# Patient Record
Sex: Male | Born: 1950 | Race: Black or African American | Hispanic: No | Marital: Married | Smoking: Former smoker
Health system: Southern US, Community
[De-identification: ages and names within clinical notes are randomized; demographics above are authoritative.]

## PROBLEM LIST (undated history)

## (undated) DIAGNOSIS — M1990 Unspecified osteoarthritis, unspecified site: Secondary | ICD-10-CM

## (undated) DIAGNOSIS — C20 Malignant neoplasm of rectum: Secondary | ICD-10-CM

## (undated) DIAGNOSIS — T7840XA Allergy, unspecified, initial encounter: Secondary | ICD-10-CM

## (undated) HISTORY — PX: WISDOM TOOTH EXTRACTION: SHX21

## (undated) HISTORY — DX: Allergy, unspecified, initial encounter: T78.40XA

## (undated) HISTORY — DX: Unspecified osteoarthritis, unspecified site: M19.90

---

## 2015-08-27 ENCOUNTER — Ambulatory Visit (INDEPENDENT_AMBULATORY_CARE_PROVIDER_SITE_OTHER): Payer: Managed Care, Other (non HMO) | Admitting: Family

## 2015-08-27 ENCOUNTER — Encounter: Payer: Self-pay | Admitting: Family

## 2015-08-27 ENCOUNTER — Other Ambulatory Visit (INDEPENDENT_AMBULATORY_CARE_PROVIDER_SITE_OTHER): Payer: Managed Care, Other (non HMO)

## 2015-08-27 ENCOUNTER — Telehealth: Payer: Self-pay | Admitting: Family

## 2015-08-27 VITALS — BP 142/86 | HR 78 | Temp 97.8°F | Resp 18 | Ht 71.0 in | Wt 222.8 lb

## 2015-08-27 DIAGNOSIS — Z23 Encounter for immunization: Secondary | ICD-10-CM | POA: Diagnosis not present

## 2015-08-27 DIAGNOSIS — R202 Paresthesia of skin: Secondary | ICD-10-CM

## 2015-08-27 DIAGNOSIS — R200 Anesthesia of skin: Secondary | ICD-10-CM | POA: Insufficient documentation

## 2015-08-27 LAB — B12 AND FOLATE PANEL
Folate: 6.3 ng/mL (ref >5.9)
Vitamin B-12: 332 pg/mL (ref 211–911)

## 2015-08-27 LAB — TSH: TSH: 2.21 u[IU]/mL (ref 0.35–4.50)

## 2015-08-27 LAB — CBC
HEMATOCRIT: 44.1 % (ref 39.0–52.0)
HEMOGLOBIN: 14.7 g/dL (ref 13.0–17.0)
MCHC: 33.4 g/dL (ref 30.0–36.0)
MCV: 90.8 fl (ref 78.0–100.0)
Platelets: 279 10*3/uL (ref 150.0–400.0)
RBC: 4.86 Mil/uL (ref 4.22–5.81)
RDW: 12 % (ref 11.5–15.5)
WBC: 4.9 10*3/uL (ref 4.0–10.5)

## 2015-08-27 LAB — HEMOGLOBIN A1C: HEMOGLOBIN A1C: 4.4 % — AB (ref 4.6–6.5)

## 2015-08-27 NOTE — Patient Instructions (Signed)
Thank you for choosing [facility].  Summary/Instructions:  Please schedule a time for your physical at your convenience.  Please stop by the lab on the basement level of the building for your blood work. Your results will be released to [place] (or called to you) after review, usually within 72 hours after test completion. If any changes need to be made, you will be notified at that same time.  If your symptoms worsen or fail to improve, please contact our office for further instruction, or in case of emergency go directly to the emergency room at the closest medical facility.

## 2015-08-27 NOTE — Telephone Encounter (Signed)
Please inform patient that his blood work was all within the normal ranges therefore his symptoms are most likely musculoskeletal related. Please continue with the current plan of splinting as needed and follow up if there is no improvement.

## 2015-08-27 NOTE — Assessment & Plan Note (Signed)
Transient numbness and tingling along radial nerve distribution with no obvious cause. Obtain TSH, hemoglobin A1c, CBC, and B12/folate to rule out metabolic causes. She conservatively with splint at night. Follow-up pending lab work or worsening of symptoms.

## 2015-08-27 NOTE — Addendum Note (Signed)
Addended by: Delice Bison E on: 08/27/2015 09:22 AM   Modules accepted: Orders

## 2015-08-27 NOTE — Progress Notes (Signed)
Subjective:    [name], male    DOB: [birth year], 64 y.o.   MRN: [id number]  Chief Complaint  Patient presents with  . Establish Care    numbness in fingers on his right hand on and off, x3 years     HPI:  Shane [name] is a 64 y.o. male who  has a past medical history of Arthritis and Allergy. and presents today for an office visit to establish care.  1.) Numbness in hand - Associated symptom of numbness and tingling located in his right hand has been going on for about 4 years and described as he feels like he was laying on his hand. Timing of the symptoms are worse in the morning and improves throughout the day to near complete resolution. Numbness and tingling is primarily in his thumb and second finger, but it is also in all of his fingers. He is right hand dominant. Denies trauma to the area or sounds/sensations heard or felt. Denies neck pain, but notes occasional arm soreness. Time is the only modifying factor and denies other treatments attempted.   No Known Allergies   No outpatient prescriptions prior to visit.   No facility-administered medications prior to visit.     Past Medical History  Diagnosis Date  . Arthritis   . Allergy      History reviewed. No pertinent past surgical history.   Family History  Problem Relation Age of Onset  . Healthy Mother   . Hypertension Father   . Stroke Father      Social History   Social History  . Marital Status: Married    Spouse Name: N/A  . Number of Children: 2  . Years of Education: 14   Occupational History  . Service Technician    Social History Main Topics  . Smoking status: Former Smoker -- 1.00 packs/day for 10 years    Types: Cigarettes    Quit date: 08/09/1978  . Smokeless tobacco: Never Used  . Alcohol Use: No  . Drug Use: No  . Sexual Activity: Not on file   Other Topics Concern  . 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  Endocrine: Negative for polydipsia, polyphagia and polyuria.  Musculoskeletal: Negative for neck pain and neck stiffness.  Neurological: Positive for numbness. Negative for weakness.      Objective:    BP 142/86 mmHg  Pulse 78  Temp(Src) 97.8 F (36.6 C) (Oral)  Resp 18  Ht 5\' 11"  (1.803 m)  Wt 222 lb 12.8 oz (101.061 kg)  BMI 31.09 kg/m2  SpO2 97% Nursing note and vital signs reviewed.  Physical Exam  Constitutional: He is oriented to person, place, and time. He appears well-developed and well-nourished. No distress.  Cardiovascular: Normal rate, regular rhythm, normal heart sounds and intact distal pulses.   Pulmonary/Chest: Effort normal and breath sounds normal.  Musculoskeletal:  Right hand - no obvious deformity, discoloration, or edema noted. No tenderness able to be elicited. Range of motion of fingers and wrist are normal both actively and passively. Tinel sign is negative; Phalen sign is negative. Pulses and capillary refill are intact and appropriate.  Neurological: He is alert and oriented to person, place, and time.  Skin: Skin is warm and dry.  Psychiatric: He has a normal mood and affect. His behavior is  normal. Judgment and thought content normal.       Assessment & Plan:   Problem List Items Addressed This Visit      Other   Numbness and tingling in hands - Primary    Transient numbness and tingling along radial nerve distribution with no obvious cause. Obtain TSH, hemoglobin A1c, CBC, and B12/folate to rule out metabolic causes. She conservatively with splint at night. Follow-up pending lab work or worsening of symptoms.      Relevant Orders   Hemoglobin A1c   CBC   B12 and Folate Panel   TSH

## 2015-08-29 NOTE — Telephone Encounter (Signed)
LVM for pt to call back. Phone was cut off

## 2015-09-02 ENCOUNTER — Encounter: Payer: Self-pay | Admitting: Family

## 2015-09-02 ENCOUNTER — Ambulatory Visit (INDEPENDENT_AMBULATORY_CARE_PROVIDER_SITE_OTHER): Payer: Managed Care, Other (non HMO) | Admitting: Family

## 2015-09-02 VITALS — BP 138/80 | HR 69 | Temp 98.2°F | Resp 18 | Ht 71.0 in | Wt 222.1 lb

## 2015-09-02 DIAGNOSIS — Z23 Encounter for immunization: Secondary | ICD-10-CM

## 2015-09-02 DIAGNOSIS — Z0000 Encounter for general adult medical examination without abnormal findings: Secondary | ICD-10-CM | POA: Insufficient documentation

## 2015-09-02 NOTE — Patient Instructions (Addendum)
Thank you for choosing [facility].  Summary/Instructions:  Please stop by the lab on the basement level of the building for your blood work. Your results will be released to [place] (or called to you) after review, usually within 72 hours after test completion. If any changes need to be made, you will be notified at that same time.  If your symptoms worsen or fail to improve, please contact our office for further instruction, or in case of emergency go directly to the emergency room at the closest medical facility.    Health Maintenance, Male A healthy lifestyle and preventative care can promote health and wellness.  Maintain regular health, dental, and eye exams.  Eat a healthy diet. Foods like vegetables, fruits, whole grains, low-fat dairy products, and lean protein foods contain the nutrients you need and are low in calories. Decrease your intake of foods high in solid fats, added sugars, and salt. Get information about a proper diet from your health care provider, if necessary.  Regular physical exercise is one of the most important things you can do for your health. Most adults should get at least 150 minutes of moderate-intensity exercise (any activity that increases your heart rate and causes you to sweat) each week. In addition, most adults need muscle-strengthening exercises on 2 or more days a week.   Maintain a healthy weight. The body mass index (BMI) is a screening tool to identify possible weight problems. It provides an estimate of body fat based on height and weight. Your health care provider can find your BMI and can help you achieve or maintain a healthy weight. For males 20 years and older:  A BMI below 18.5 is considered underweight.  A BMI of 18.5 to 24.9 is normal.  A BMI of 25 to 29.9 is considered overweight.  A BMI of 30 and above is considered obese.  Maintain normal blood lipids and cholesterol by exercising and minimizing your intake of saturated fat.  Eat a balanced diet with plenty of fruits and vegetables. Blood tests for lipids and cholesterol should begin at age 66 and be repeated every 5 years. If your lipid or cholesterol levels are high, you are over age 49, or you are at high risk for heart disease, you may need your cholesterol levels checked more frequently.Ongoing high lipid and cholesterol levels should be treated with medicines if diet and exercise are not working.  If you smoke, find out from your health care provider how to quit. If you do not use tobacco, do not start.  Lung cancer screening is recommended for adults aged 80-80 years who are at high risk for developing lung cancer because of a history of smoking. A yearly low-dose CT scan of the lungs is recommended for people who have at least a 30-pack-year history of smoking and are current smokers or have quit within the past 15 years. A pack year of smoking is smoking an average of 1 pack of cigarettes a day for 1 year (for example, a 30-pack-year history of smoking could mean smoking 1 pack a day for 30 years or 2 packs a day for 15 years). Yearly screening should continue until the smoker has stopped smoking for at least 15 years. Yearly screening should be stopped for people who develop a health problem that would prevent them from having lung cancer treatment.  If you choose to drink alcohol, do not have more than 2 drinks per day. One drink is considered to be 12 oz (360 mL)  of beer, 5 oz (150 mL) of wine, or 1.5 oz (45 mL) of liquor.  Avoid the use of street drugs. Do not share needles with anyone. Ask for help if you need support or instructions about stopping the use of drugs.  High blood pressure causes heart disease and increases the risk of stroke. High blood pressure is more likely to develop in:  People who have blood pressure in the end of the normal range (100-139/85-89 mm Hg).  People who are overweight or obese.  People who are African American.  If you are  57-1 years of age, have your blood pressure checked every 3-5 years. If you are 4 years of age or older, have your blood pressure checked every year. You should have your blood pressure measured twice--once when you are at a hospital or clinic, and once when you are not at a hospital or clinic. Record the average of the two measurements. To check your blood pressure when you are not at a hospital or clinic, you can use:  An automated blood pressure machine at a pharmacy.  A home blood pressure monitor.  If you are 33-25 years old, ask your health care provider if you should take aspirin to prevent heart disease.  Diabetes screening involves taking a blood sample to check your fasting blood sugar level. This should be done once every 3 years after age 32 if you are at a normal weight and without risk factors for diabetes. Testing should be considered at a younger age or be carried out more frequently if you are overweight and have at least 1 risk factor for diabetes.  Colorectal cancer can be detected and often prevented. Most routine colorectal cancer screening begins at the age of 51 and continues through age 31. However, your health care provider may recommend screening at an earlier age if you have risk factors for colon cancer. On a yearly basis, your health care provider may provide home test kits to check for hidden blood in the stool. A small camera at the end of a tube may be used to directly examine the colon (sigmoidoscopy or colonoscopy) to detect the earliest forms of colorectal cancer. Talk to your health care provider about this at age 63 when routine screening begins. A direct exam of the colon should be repeated every 5-10 years through age 33, unless early forms of precancerous polyps or small growths are found.  People who are at an increased risk for hepatitis B should be screened for this virus. You are considered at high risk for hepatitis B if:  You were born in a country where  hepatitis B occurs often. Talk with your health care provider about which countries are considered high risk.  Your parents were born in a high-risk country and you have not received a shot to protect against hepatitis B (hepatitis B vaccine).  You have HIV or AIDS.  You use needles to inject street drugs.  You live with, or have sex with, someone who has hepatitis B.  You are a man who has sex with other men (MSM).  You get hemodialysis treatment.  You take certain medicines for conditions like cancer, organ transplantation, and autoimmune conditions.  Hepatitis C blood testing is recommended for all people born from [birth year] through 1967 and any individual with known risk factors for hepatitis C.  Healthy men should no longer receive prostate-specific antigen (PSA) blood tests as part of routine cancer screening. Talk to your health care provider about  prostate cancer screening.  Testicular cancer screening is not recommended for adolescents or adult males who have no symptoms. Screening includes self-exam, a health care provider exam, and other screening tests. Consult with your health care provider about any symptoms you have or any concerns you have about testicular cancer.  Practice safe sex. Use condoms and avoid high-risk sexual practices to reduce the spread of sexually transmitted infections (STIs).  You should be screened for STIs, including gonorrhea and chlamydia if:  You are sexually active and are younger than 24 years.  You are older than 24 years, and your health care provider tells you that you are at risk for this type of infection.  Your sexual activity has changed since you were last screened, and you are at an increased risk for chlamydia or gonorrhea. Ask your health care provider if you are at risk.  If you are at risk of being infected with HIV, it is recommended that you take a prescription medicine daily to prevent HIV infection. This is called pre-exposure  prophylaxis (PrEP). You are considered at risk if:  You are a man who has sex with other men (MSM).  You are a heterosexual man who is sexually active with multiple partners.  You take drugs by injection.  You are sexually active with a partner who has HIV.  Talk with your health care provider about whether you are at high risk of being infected with HIV. If you choose to begin PrEP, you should first be tested for HIV. You should then be tested every 3 months for as long as you are taking PrEP.  Use sunscreen. Apply sunscreen liberally and repeatedly throughout the day. You should seek shade when your shadow is shorter than you. Protect yourself by wearing long sleeves, pants, a wide-brimmed hat, and sunglasses year round whenever you are outdoors.  Tell your health care provider of new moles or changes in moles, especially if there is a change in shape or color. Also, tell your health care provider if a mole is larger than the size of a pencil eraser.  A one-time screening for abdominal aortic aneurysm (AAA) and surgical repair of large AAAs by ultrasound is recommended for men aged 80-75 years who are current or former smokers.  Stay current with your vaccines (immunizations).   This information is not intended to replace advice given to you by your health care provider. Make sure you discuss any questions you have with your health care provider.   Document Released: 04/23/2008 Document Revised: 11/16/2014 Document Reviewed: 03/23/2011 Elsevier Interactive Patient Education 2016 [place] Directive Advance directives are the legal documents that allow you to make choices about your health care and medical treatment if you cannot speak for yourself. Advance directives are a way for you to communicate your wishes to family, friends, and health care providers. The specified people can then convey your decisions about end-of-life care to avoid confusion if you should become  unable to communicate. Ideally, the process of discussing and writing advance directives should happen over time rather than making decisions all at once. Advance directives can be modified as your situation changes, and you can change your mind at any time, even after you have signed the advance directives. Each state has its own laws regarding advance directives. You may want to check with your health care provider, attorney, or state representative about the law in your state. Below are some examples of advance directives. LIVING WILL A living will is  a set of instructions documenting your wishes about medical care when you cannot care for yourself. It is used if you become:  Terminally ill.  Incapacitated.  Unable to communicate.  Unable to make decisions. Items to consider in your living will include:  The use or non-use of life-sustaining equipment, such as dialysis machines and breathing machines (ventilators).  A do not resuscitate (DNR) order, which is the instruction not to use cardiopulmonary resuscitation (CPR) if breathing or heartbeat stops.  Tube feeding.  Withholding of food and fluids.  Comfort (palliative) care when the goal becomes comfort rather than a cure.  Organ and tissue donation. A living will does not give instructions about distribution of your money and property if you should pass away. It is advisable to seek the expert advice of a lawyer in drawing up a will regarding your possessions. Decisions about taxes, beneficiaries, and asset distribution will be legally binding. This process can relieve your family and friends of any burdens surrounding disputes or questions that may come up about the allocation of your assets. DO NOT RESUSCITATE (DNR) A do not resuscitate (DNR) order is a request to not have CPR in the event that your heart stops beating or you stop breathing. Unless given other instructions, a health care provider will try to help any patient whose  heart has stopped or who has stopped breathing.  HEALTH CARE PROXY AND DURABLE POWER OF ATTORNEY FOR HEALTH CARE A health care proxy is a person (agent) appointed to make medical decisions for you if you cannot. Generally, people choose someone they know well and trust to represent their preferences when they can no longer do so. You should be sure to ask this person for agreement to act as your agent. An agent may have to exercise judgment in the event of a medical decision for which your wishes are not known. The durable power of attorney for health care is the legal document that names your health care proxy. Once written, it should be:  Signed.  Notarized.  Dated.  Copied.  Witnessed.  Incorporated into your medical record. You may also want to appoint someone to manage your financial affairs if you cannot. This is called a durable power of attorney for finances. It is a separate legal document from the durable power of attorney for health care. You may choose the same person or someone different from your health care proxy to act as your agent in financial matters.   This information is not intended to replace advice given to you by your health care provider. Make sure you discuss any questions you have with your health care provider.   Document Released: 02/02/2008 Document Revised: 10/31/2013 Document Reviewed: 03/15/2013 Elsevier Interactive Patient Education [employer].

## 2015-09-02 NOTE — Telephone Encounter (Signed)
Pt aware of results 

## 2015-09-02 NOTE — Assessment & Plan Note (Signed)
1) Anticipatory Guidance: Discussed importance of wearing a seatbelt while driving and not texting while driving; changing batteries in smoke detector at least once annually; wearing suntan lotion when outside; eating a balanced and moderate diet; getting physical activity at least 30 minutes per day.  2) Immunizations / Screenings / Labs:  Prevnar updated. Zostavax declined following discussion. All other immunizations are up-to-date per recommendations. Due for a dental screening which will be scheduled independently. Due for colonoscopy which referral has been placed. Due for prostate check. PSA ordered. All other screenings are up-to-date per recommendations.Obtain CBC, CMET, Lipid profile and TSH when fasting.   Overall well exam. Patient has risk factors for cardiovascular disease including obesity, sedentary lifestyle, and inadequate nutrition. Counseled on increasing nutrient density of foods and decreasing saturated fats through increased fruits and vegetables. Increase physical activity to 30 minutes most days of the week with recommended steps of approximately 10,000. Weight loss goal of 5-10% of current weight to improve health. Continue other healthy lifestyle behaviors. Follow-up prevention exam in one year. Follow-up office visit pending blood work.

## 2015-09-02 NOTE — Progress Notes (Signed)
Pre visit review using our clinic review tool, if applicable. No additional management support is needed unless otherwise documented below in the visit note. 

## 2015-09-02 NOTE — Progress Notes (Signed)
Subjective:    [name], male    DOB: [birth year], 64 y.o.   MRN: [id number]  Chief Complaint  Patient presents with  . CPE    Not fasting    HPI:  Shane [name] is a 64 y.o. male who presents today for an annual wellness visit.   1) Health Maintenance -   Diet - Averages about 1-2 meals per day with snacks consisting of chicken, beef, occasional fish, some vegetables, fruit and dairy; Caffeine intake of about 2-3 cups daily.  Exercise - No structured exercise.    2) Preventative Exams / Immunizations:  Dental -- Due for exam  Vision -- Up to date.    Health Maintenance  Topic Date Due  . Hepatitis C Screening  1951-03-23  . HIV Screening  10/22/1964  . COLONOSCOPY  10/23/1999  . ZOSTAVAX  10/22/2009  . PNA vac Low Risk Adult (1 of 2 - PCV13) 10/22/2014  . INFLUENZA VACCINE  06/09/2016  . TETANUS/TDAP  08/26/2025  Prevnar  Immunization History  Administered Date(s) Administered  . Influenza,inj,Quad PF,36+ Mos 08/27/2015  . Pneumococcal Conjugate-13 09/02/2015  . Tdap 08/27/2015    No Known Allergies   Outpatient Prescriptions Prior to Visit  Medication Sig Dispense Refill  . diclofenac (VOLTAREN) 75 MG EC tablet Take 75 mg by mouth 2 (two) times daily.     No facility-administered medications prior to visit.     Past Medical History  Diagnosis Date  . Arthritis   . Allergy      No past surgical history on file.   Family History  Problem Relation Age of Onset  . Healthy Mother   . Hypertension Father   . Stroke Father      Social History   Social History  . Marital Status: Married    Spouse Name: N/A  . Number of Children: 2  . Years of Education: 14   Occupational History  . Service Technician    Social History Main Topics  . Smoking status: Former Smoker -- 1.00 packs/day for 10 years    Types: Cigarettes    Quit date: 08/09/1978  . Smokeless tobacco: Never Used  . Alcohol Use: No  . Drug Use: No  .  Sexual Activity: Not on file   Other Topics Concern  . 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 HENT: Head: Denies headache or neck pain Ears: Denies changes in hearing, ringing in ears, earache, drainage Nose: Denies discharge, stuffiness, itching, nosebleed, sinus pain Throat: Denies sore throat, hoarseness, dry mouth, sores, thrush Eyes: Denies loss/changes in vision, pain, redness, blurry/double vision, flashing lights Cardiovascular: Denies chest pain/discomfort, tightness, palpitations, shortness of breath with activity, difficulty lying down, swelling, sudden awakening with shortness of breath Respiratory: Denies shortness of breath, cough, sputum production, wheezing Gastrointestinal: Denies dysphasia, heartburn, change in appetite, nausea, change in bowel habits, rectal bleeding, constipation, diarrhea, yellow skin or eyes Genitourinary: Denies frequency, urgency, burning/pain, blood in urine, incontinence, change in urinary strength. Musculoskeletal: Denies muscle/joint pain, stiffness, back pain, redness or swelling of joints, trauma Skin: Denies rashes, lumps, itching, dryness, color changes, or hair/nail changes Neurological: Denies dizziness, fainting, seizures, weakness, numbness, tingling, tremor Psychiatric - Denies nervousness, stress, depression or memory loss Endocrine: Denies heat or cold intolerance, sweating, frequent urination, excessive thirst, changes in appetite Hematologic: Denies  ease of bruising or bleeding     Objective:    BP 138/80 mmHg  Pulse 69  Temp(Src) 98.2 F (36.8 C) (Oral)  Resp 18  Ht 5\' 11"  (1.803 m)  Wt 222 lb 1.9 oz (100.753 kg)  BMI 30.99 kg/m2  SpO2 97% Nursing note and vital signs reviewed.  Physical Exam  Constitutional: He is oriented to person, place, and time. He  appears well-developed and well-nourished.  HENT:  Head: Normocephalic.  Right Ear: Hearing, tympanic membrane, external ear and ear canal normal.  Left Ear: Hearing, tympanic membrane, external ear and ear canal normal.  Nose: Nose normal.  Mouth/Throat: Uvula is midline, oropharynx is clear and moist and mucous membranes are normal.  Eyes: Conjunctivae and EOM are normal. Pupils are equal, round, and reactive to light.  Neck: Neck supple. No JVD present. No tracheal deviation present. No thyromegaly present.  Cardiovascular: Normal rate, regular rhythm, normal heart sounds and intact distal pulses.   Pulmonary/Chest: Effort normal and breath sounds normal.  Abdominal: Soft. Bowel sounds are normal. He exhibits no distension and no mass. There is no tenderness. There is no rebound and no guarding.  Musculoskeletal: Normal range of motion. He exhibits no edema or tenderness.  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   Lymphadenopathy:    He has no cervical adenopathy.  Neurological: He is alert and oriented to person, place, and time. He has normal reflexes. No cranial nerve deficit. He exhibits normal muscle tone. Coordination normal.  Skin: Skin is warm and dry.  Psychiatric: He has a normal mood and affect. His behavior is normal. Judgment and thought content normal.       Assessment & Plan:   Problem List Items Addressed This Visit      Other   Routine general medical examination at a health care facility - Primary    1) Anticipatory Guidance: Discussed importance of wearing a seatbelt while driving and not texting while driving; changing batteries in smoke detector at least once annually; wearing suntan lotion when outside; eating a balanced and moderate diet; getting physical activity at least 30 minutes per day.  2) Immunizations / Screenings / Labs:  Prevnar updated. Zostavax declined following discussion. All other immunizations are up-to-date per  recommendations. Due for a dental screening which will be scheduled independently. Due for colonoscopy which referral has been placed. Due for prostate check. PSA ordered. All other screenings are up-to-date per recommendations.Obtain CBC, CMET, Lipid profile and TSH when fasting.   Overall well exam. Patient has risk factors for cardiovascular disease including obesity, sedentary lifestyle, and inadequate nutrition. Counseled on increasing nutrient density of foods and decreasing saturated fats through increased fruits and vegetables. Increase physical activity to 30 minutes most days of the week with recommended steps of approximately 10,000. Weight loss goal of 5-10% of current weight to improve health. Continue other healthy lifestyle behaviors. Follow-up prevention exam in one year. Follow-up office visit pending blood work.       Relevant Orders   Ambulatory referral to Gastroenterology   CBC   Comprehensive metabolic panel   TSH   PSA   Lipid panel    Other Visit Diagnoses    Need for vaccination with 13-polyvalent pneumococcal conjugate vaccine        Relevant Orders    Pneumococcal conjugate vaccine 13-valent IM (Completed)

## 2015-09-03 ENCOUNTER — Other Ambulatory Visit (INDEPENDENT_AMBULATORY_CARE_PROVIDER_SITE_OTHER): Payer: Managed Care, Other (non HMO)

## 2015-09-03 ENCOUNTER — Telehealth: Payer: Self-pay | Admitting: Family

## 2015-09-03 ENCOUNTER — Encounter: Payer: Self-pay | Admitting: Family

## 2015-09-03 DIAGNOSIS — Z0000 Encounter for general adult medical examination without abnormal findings: Secondary | ICD-10-CM

## 2015-09-03 LAB — COMPREHENSIVE METABOLIC PANEL
ALT: 23 U/L (ref 0–53)
AST: 19 U/L (ref 0–37)
Albumin: 4.2 g/dL (ref 3.5–5.2)
Alkaline Phosphatase: 91 U/L (ref 39–117)
BILIRUBIN TOTAL: 1.2 mg/dL (ref 0.2–1.2)
BUN: 12 mg/dL (ref 6–23)
CALCIUM: 9.6 mg/dL (ref 8.4–10.5)
CO2: 28 meq/L (ref 19–32)
CREATININE: 1.05 mg/dL (ref 0.40–1.50)
Chloride: 104 mEq/L (ref 96–112)
GFR: 90.92 mL/min (ref >60.00)
GLUCOSE: 100 mg/dL — AB (ref 70–99)
Potassium: 4.3 mEq/L (ref 3.5–5.1)
Sodium: 139 mEq/L (ref 135–145)
Total Protein: 6.9 g/dL (ref 6.0–8.3)

## 2015-09-03 LAB — LIPID PANEL
CHOLESTEROL: 168 mg/dL (ref 0–200)
HDL: 33.3 mg/dL — ABNORMAL LOW (ref >39.00)
LDL Cholesterol: 118 mg/dL — ABNORMAL HIGH (ref 0–99)
NonHDL: 134.99
TRIGLYCERIDES: 83 mg/dL (ref 0.0–149.0)
Total CHOL/HDL Ratio: 5
VLDL: 16.6 mg/dL (ref 0.0–40.0)

## 2015-09-03 LAB — CBC
HCT: 43.9 % (ref 39.0–52.0)
Hemoglobin: 14.8 g/dL (ref 13.0–17.0)
MCHC: 33.7 g/dL (ref 30.0–36.0)
MCV: 90.6 fl (ref 78.0–100.0)
PLATELETS: 269 10*3/uL (ref 150.0–400.0)
RBC: 4.85 Mil/uL (ref 4.22–5.81)
RDW: 12.1 % (ref 11.5–15.5)
WBC: 10.7 10*3/uL — ABNORMAL HIGH (ref 4.0–10.5)

## 2015-09-03 LAB — PSA: PSA: 0.29 ng/mL (ref 0.10–4.00)

## 2015-09-03 LAB — TSH: TSH: 0.98 u[IU]/mL (ref 0.35–4.50)

## 2015-09-03 NOTE — Telephone Encounter (Signed)
Pt came in today after doing labs from yesterday's visit and stated you all had talked about him possibly needing an x-ray on his neck.  Will you order that for the pt?  Please msg him via MyChart for instructions/further information regarding this.

## 2015-09-03 NOTE — Telephone Encounter (Signed)
Responded via MyChart.

## 2015-09-20 ENCOUNTER — Encounter: Payer: Self-pay | Admitting: Family

## 2015-09-24 MED ORDER — DICLOFENAC SODIUM 75 MG PO TBEC: 75.0000 mg | DELAYED_RELEASE_TABLET | Freq: Two times a day (BID) | ORAL | Status: DC

## 2015-09-25 ENCOUNTER — Other Ambulatory Visit: Payer: Self-pay

## 2015-09-25 MED ORDER — SILDENAFIL CITRATE 100 MG PO TABS
50.0000 mg | ORAL_TABLET | Freq: Every day | ORAL | Status: DC | PRN
Start: 2015-09-25 — End: 2016-09-25

## 2015-09-25 MED ORDER — DICLOFENAC SODIUM 75 MG PO TBEC: 75.0000 mg | DELAYED_RELEASE_TABLET | Freq: Two times a day (BID) | ORAL | Status: DC

## 2015-09-25 NOTE — Addendum Note (Signed)
Addended by: Mauricio Po D on: 09/25/2015 10:24 PM   Modules accepted: Orders

## 2015-10-10 ENCOUNTER — Ambulatory Visit (INDEPENDENT_AMBULATORY_CARE_PROVIDER_SITE_OTHER)
Admission: RE | Admit: 2015-10-10 | Discharge: 2015-10-10 | Disposition: A | Discharge status: Home / Self Care | Payer: Managed Care, Other (non HMO) | Source: Ambulatory Visit | Attending: Family | Admitting: Family

## 2015-10-10 DIAGNOSIS — R202 Paresthesia of skin: Secondary | ICD-10-CM

## 2015-10-10 DIAGNOSIS — Z0000 Encounter for general adult medical examination without abnormal findings: Secondary | ICD-10-CM

## 2015-10-11 ENCOUNTER — Other Ambulatory Visit: Payer: Self-pay | Admitting: Family

## 2015-10-14 ENCOUNTER — Encounter: Payer: Self-pay | Admitting: Family

## 2015-10-24 ENCOUNTER — Encounter: Payer: Self-pay | Admitting: Family Medicine

## 2015-10-24 ENCOUNTER — Ambulatory Visit (INDEPENDENT_AMBULATORY_CARE_PROVIDER_SITE_OTHER)
Admission: RE | Admit: 2015-10-24 | Discharge: 2015-10-24 | Disposition: A | Discharge status: Home / Self Care | Payer: Managed Care, Other (non HMO) | Source: Ambulatory Visit | Attending: Family Medicine | Admitting: Family Medicine

## 2015-10-24 ENCOUNTER — Ambulatory Visit (INDEPENDENT_AMBULATORY_CARE_PROVIDER_SITE_OTHER): Payer: Managed Care, Other (non HMO) | Admitting: Family Medicine

## 2015-10-24 VITALS — BP 118/72 | HR 74 | Ht 71.0 in | Wt 221.0 lb

## 2015-10-24 DIAGNOSIS — M170 Bilateral primary osteoarthritis of knee: Secondary | ICD-10-CM

## 2015-10-24 DIAGNOSIS — M25562 Pain in left knee: Secondary | ICD-10-CM

## 2015-10-24 DIAGNOSIS — M25561 Pain in right knee: Secondary | ICD-10-CM

## 2015-10-24 MED ORDER — DICLOFENAC SODIUM 2 % TD SOLN: 2.0000 "application " | Freq: Two times a day (BID) | TRANSDERMAL | Status: DC

## 2015-10-24 NOTE — Patient Instructions (Signed)
Good to see you.  Ice 20 minutes 2 times daily. Usually after activity and before bed. Exercises 3 times a week.  pennsaid pinkie amount topically 2 times daily as needed.  Take tylenol 650 mg three times a day is the best evidence based medicine we have for arthritis.  Glucosamine sulfate 1500mg  a day is a supplement that has been shown to help moderate to severe arthritis. Vitamin D 2000 IU daily Fish oil 2 grams daily.  Tumeric 500mg  twice daily.  Capsaicin topically up to four times a day may also help with pain. If cortisone injections do not help, there are different types of shots that may help but they take longer to take effect.  We can discuss this at follow up.  It's important that you continue to stay active. Controlling your weight is important.  Consider physical therapy to strengthen muscles around the joint that hurts to take pressure off of the joint itself. Shoe inserts with good arch support may be helpful.   Water aerobics and cycling with low resistance are the best two types of exercise for arthritis. Come back and see me in 3-4 weeks.

## 2015-10-24 NOTE — Progress Notes (Signed)
Pre visit review using our clinic review tool, if applicable. No additional management support is needed unless otherwise documented below in the visit note. 

## 2015-10-24 NOTE — Progress Notes (Signed)
[name] [address] [place], [place] [address] Phone: [phone] Subjective:    I'm seeing this patient by the request  of:  Mauricio Po, FNP   CC: Bilateral knee pain  [id number] Shane [name] is a 64 y.o. male coming in with complaint of bilateral knee pain. Patient has had this pain for multiple years. Was seen another provider. Last time was given an injection greater than a year ago. Patient has been dealing with it but has noticed some increasing pain. Seems to be more on the medial aspect of the knees bilaterally. Some minimal instability. Not stopping him from activities but even at night can wake him up sometimes. Rates the severity of 6 out of 10. Tries to avoid any such as over-the-counter medications when he can but hasn't notice that he is needed a prescription anti-inflammatory at least 2 times a day. Was very active when younger and patient was in the [employer]. Sometimes has some swelling but no erythema.  Past Medical History  Diagnosis Date  . Arthritis   . Allergy    No past surgical history on file. Social History  Substance Use Topics  . Smoking status: Former Smoker -- 1.00 packs/day for 10 years    Types: Cigarettes    Quit date: 08/09/1978  . Smokeless tobacco: Never Used  . Alcohol Use: No   No Known Allergies Family History  Problem Relation Age of Onset  . Healthy Mother   . Hypertension Father   . Stroke Father         Past medical history, social, surgical and family history all reviewed in electronic medical record.   Review of Systems: No headache, visual changes, nausea, vomiting, diarrhea, constipation, dizziness, abdominal pain, skin rash, fevers, chills, night sweats, weight loss, swollen lymph nodes, body aches, joint swelling, muscle aches, chest pain, shortness of breath, mood changes.   Objective Blood pressure 118/72, pulse 74, height 5\' 11"  (1.803 m), weight 221 lb (100.245 kg), SpO2 98 %.   General: No apparent distress alert and oriented x3 mood and affect normal, dressed appropriately.  HEENT: Pupils equal, extraocular movements intact  Respiratory: Patient's speak in full sentences and does not appear short of breath  Cardiovascular: No lower extremity edema, non tender, no erythema  Skin: Warm dry intact with no signs of infection or rash on extremities or on axial skeleton.  Abdomen: Soft nontender  Neuro: Cranial nerves II through XII are intact, neurovascularly intact in all extremities with 2+ DTRs and 2+ pulses.  Lymph: No lymphadenopathy of posterior or anterior cervical chain or axillae bilaterally.  Gait normal with good balance and coordination.  MSK:  Non tender with full range of motion and good stability and symmetric strength and tone of shoulders, elbows, wrist, hip, and ankles bilaterally.  Knee: Bilateral Mild varus deformity of the knees bilaterally Tender to palpation over the medial joint lines ROM full in flexion and extension and lower leg rotation. Ligaments with solid consistent endpoints including ACL, PCL, LCL, MCL. Negative Mcmurray's, Apley's, and Thessalonian tests. Mild painful patellar compression. Patellar glide with moderate crepitus. Patellar and quadriceps tendons unremarkable. Hamstring and quadriceps strength is normal.    After informed written and verbal consent, patient was seated on exam table. Right knee was prepped with alcohol swab and utilizing anterolateral approach, patient's right knee space was injected with 4:1  marcaine 0.5%: Kenalog 40mg /dL. Patient tolerated the procedure well without immediate complications.  After informed written and verbal  consent, patient was seated on exam table. Left knee was prepped with alcohol swab and utilizing anterolateral approach, patient's left knee space was injected with 4:1  marcaine 0.5%: Kenalog 40mg /dL. Patient tolerated the procedure well without immediate complications.     Impression and Recommendations:     This case required medical decision making of moderate complexity.

## 2015-10-24 NOTE — Assessment & Plan Note (Signed)
Given injections today. X-rays pending. Home exercises given. Discussed the possibility of a custom brace at a later date. Topical anti-inflammatories given. We discussed icing regimen. Patient will come back and see me again in 4 weeks. Discussed over-the-counter natural supplements as well. Patient has any worsening symptoms he will come back. She will likely is a candidate for disc is supple mentation and later date.

## 2016-03-24 ENCOUNTER — Ambulatory Visit (INDEPENDENT_AMBULATORY_CARE_PROVIDER_SITE_OTHER): Payer: Managed Care, Other (non HMO) | Admitting: Family Medicine

## 2016-03-24 ENCOUNTER — Encounter: Payer: Self-pay | Admitting: Family Medicine

## 2016-03-24 VITALS — BP 132/70 | HR 77 | Ht 71.0 in | Wt 222.0 lb

## 2016-03-24 DIAGNOSIS — R202 Paresthesia of skin: Secondary | ICD-10-CM | POA: Diagnosis not present

## 2016-03-24 DIAGNOSIS — M5030 Other cervical disc degeneration, unspecified cervical region: Secondary | ICD-10-CM | POA: Diagnosis not present

## 2016-03-24 DIAGNOSIS — M170 Bilateral primary osteoarthritis of knee: Secondary | ICD-10-CM | POA: Diagnosis not present

## 2016-03-24 MED ORDER — GABAPENTIN 100 MG PO CAPS: 200.0000 mg | ORAL_CAPSULE | Freq: Every day | ORAL | Status: DC

## 2016-03-24 NOTE — Progress Notes (Signed)
[name] [address] [place], [place] [address] Phone: [phone] Subjective:    I'm seeing this patient by the request  of:  Mauricio Po, FNP   CC: Bilateral knee pain follow-up  [id number] Shane [name] is a 65 y.o. male coming in with complaint of bilateral knee pain. Was seen 5 months ago and was given steroid injections in the knees bilaterally. Was doing very well until last 3 weeks. Started having some mild increase in pain again. Some mild instability of the right knee. States that it is starting to affect his daily activities as well as playing with his granddaughter. Patient denies any radiation of pain. States localize. Seems to be the same pain just worsening.  Patient is also complaining of radiation of pain going down the right arm. States that it goes all way to his fingers. States that the first and fourth fingers can be numb from time to time. States this seems to be worse in the mornings and gets better with activity. Does have associated neck pain. Has had previous imaging done. X-rays of patient's cervical spine was independently visualized by me. Patient did have arthritic changes of the neck but quite severe at multiple levels. Patient states that it is annoying but not completely affecting daily activities at this time. Rates the severity of 5 out of 10. Does not remember any true injury.  Past Medical History  Diagnosis Date  . Arthritis   . Allergy    No past surgical history on file. Social History  Substance Use Topics  . Smoking status: Former Smoker -- 1.00 packs/day for 10 years    Types: Cigarettes    Quit date: 08/09/1978  . Smokeless tobacco: Never Used  . Alcohol Use: No   No Known Allergies Family History  Problem Relation Age of Onset  . Healthy Mother   . Hypertension Father   . Stroke Father         Past medical history, social, surgical and family history all reviewed in electronic medical  record.   Review of Systems: No headache, visual changes, nausea, vomiting, diarrhea, constipation, dizziness, abdominal pain, skin rash, fevers, chills, night sweats, weight loss, swollen lymph nodes, body aches,muscle aches, chest pain, shortness of breath, mood changes.   Objective Blood pressure 132/70, pulse 77, height 5\' 11"  (1.803 m), weight 222 lb (100.699 kg), SpO2 97 %.  General: No apparent distress alert and oriented x3 mood and affect normal, dressed appropriately.  HEENT: Pupils equal, extraocular movements intact  Respiratory: Patient's speak in full sentences and does not appear short of breath  Cardiovascular: No lower extremity edema, non tender, no erythema  Skin: Warm dry intact with no signs of infection or rash on extremities or on axial skeleton.  Abdomen: Soft nontender  Neuro: Cranial nerves II through XII are intact, neurovascularly intact in all extremities with 2+ DTRs and 2+ pulses.  Lymph: No lymphadenopathy of posterior or anterior cervical chain or axillae bilaterally.  Gait normal with good balance and coordination.  MSK:  Non tender with full range of motion and good stability and symmetric strength and tone of shoulders, elbows, wrist, hip, and ankles bilaterally.   Neck: Inspection unremarkable. No palpable stepoffs. Mildly positiveSpurling's maneuver.seems to go in the C6-C7 distribution Limited range of motion lacking the last 10 of extension as well as the last 10 of right-sided side bending Grip strength and sensation normal in bilateral hands Strength good C4 to T1 distribution No sensory  change to C4 to T1 Negative Hoffman sign bilaterally Reflexes normal  Knee: Bilateral Mild varus deformity of the knees bilaterally Tender to palpation over the medial joint lines ROM full in flexion and extension and lower leg rotation. Ligaments with solid consistent endpoints including ACL, PCL, LCL, MCL. Negative Mcmurray's, Apley's, and Thessalonian  tests. Mild worsening of patellar compression bilaterally Patellar glide with moderate crepitus. Patellar and quadriceps tendons unremarkable. Hamstring and quadriceps strength is normal.  No significant change from previous exam  After informed written and verbal consent, patient was seated on exam table. Right knee was prepped with alcohol swab and utilizing anterolateral approach, patient's right knee space was injected with 4:1  marcaine 0.5%: Kenalog 40mg /dL. Patient tolerated the procedure well without immediate complications.  After informed written and verbal consent, patient was seated on exam table. Left knee was prepped with alcohol swab and utilizing anterolateral approach, patient's left knee space was injected with 4:1  marcaine 0.5%: Kenalog 40mg /dL. Patient tolerated the procedure well without immediate complications.   Impression and Recommendations:     This case required medical decision making of moderate complexity.

## 2016-03-24 NOTE — Progress Notes (Signed)
Pre visit review using our clinic review tool, if applicable. No additional management support is needed unless otherwise documented below in the visit note. 

## 2016-03-24 NOTE — Assessment & Plan Note (Signed)
Patient given injections again. Patient given refill of topical anti-inflammatories. We discussed icing regimen. Discussed changing certain exercises. Follow-up again in 4 weeks. Patient could be a candidate for viscous supplementation.

## 2016-03-24 NOTE — Patient Instructions (Addendum)
Good to see you  pennsaid pinkie amount topically 2 times daily as needed.  Ice 20 minutes 2 times daily. Usually after activity and before bed. We injected knees again  In 4 weeks see me and if worse then can start orthovisc.  Neck has arthritis as well.  Gabapentin 200mg  at night  See me again in 4 weeks to make sure you are doing well.

## 2016-03-24 NOTE — Assessment & Plan Note (Signed)
Known arthritic changes. We discussed that radicular symptoms could be contributing from this. Mild positive Spurling's. Started on gabapentin. Patient does not want to have further workup at the moment. We will see how he responds. In 4 weeks continuing have pain we'll either consider EMG versus possibly increasing gabapentin and formal physical therapy.

## 2016-04-29 ENCOUNTER — Encounter: Payer: Self-pay | Admitting: Family Medicine

## 2016-04-29 ENCOUNTER — Ambulatory Visit (INDEPENDENT_AMBULATORY_CARE_PROVIDER_SITE_OTHER): Payer: Managed Care, Other (non HMO) | Admitting: Family Medicine

## 2016-04-29 VITALS — BP 122/76 | HR 72 | Wt 218.0 lb

## 2016-04-29 DIAGNOSIS — M170 Bilateral primary osteoarthritis of knee: Secondary | ICD-10-CM

## 2016-04-29 NOTE — Assessment & Plan Note (Signed)
Failed all other conservative therapy at this time. Has oral anti-inflammatories for break through pain. Into the gabapentin to the neck as well as some the radicular symptoms. Patient will start on viscous supplementation today. Will come back again in 2 weeks for second in a series of 4 injections. Encourage patient to continue conservative therapy otherwise.  Spent  25 minutes with patient face-to-face and had greater than 50% of counseling including as described above in assessment and plan.

## 2016-04-29 NOTE — Progress Notes (Signed)
[name] [place],  [address] Phone: [phone] Subjective:    I'm seeing this patient by the request  of:  Mauricio Po, FNP   CC: Bilateral knee pain follow-up  [id number] Shane [name] is a 65 y.o. male coming in with complaint of bilateral knee pain. Was seen one month ago and started having worsening symptoms. Surgery having more instability of the right knee again. Patient injection did help for approximately 2 weeks but now is worsening. Has failed all other conservative therapy at this time.  Neck pain seems to be doing relatively well. Seems be stable at this time.  Past Medical History  Diagnosis Date  . Arthritis   . Allergy    No past surgical history on file. Social History  Substance Use Topics  . Smoking status: Former Smoker -- 1.00 packs/day for 10 years    Types: Cigarettes    Quit date: 08/09/1978  . Smokeless tobacco: Never Used  . Alcohol Use: No   No Known Allergies Family History  Problem Relation Age of Onset  . Healthy Mother   . Hypertension Father   . Stroke Father         Past medical history, social, surgical and family history all reviewed in electronic medical record.   Review of Systems: No headache, visual changes, nausea, vomiting, diarrhea, constipation, dizziness, abdominal pain, skin rash, fevers, chills, night sweats, weight loss, swollen lymph nodes, body aches,muscle aches, chest pain, shortness of breath, mood changes.   Objective Blood pressure 122/76, pulse 72, weight 218 lb (98.884 kg).  General: No apparent distress alert and oriented x3 mood and affect normal, dressed appropriately.  HEENT: Pupils equal, extraocular movements intact  Respiratory: Patient's speak in full sentences and does not appear short of breath  Cardiovascular: No lower extremity edema, non tender, no erythema  Skin: Warm dry intact with no signs of infection or rash on extremities or on  axial skeleton.  Abdomen: Soft nontender  Neuro: Cranial nerves II through XII are intact, neurovascularly intact in all extremities with 2+ DTRs and 2+ pulses.  Lymph: No lymphadenopathy of posterior or anterior cervical chain or axillae bilaterally.  Gait normal with good balance and coordination.  MSK:  Non tender with full range of motion and good stability and symmetric strength and tone of shoulders, elbows, wrist, hip, and ankles bilaterally.     Knee: Bilateral Mild varus deformity of the knees bilaterally Tender to palpation over the medial joint lines ROM full in flexion and extension and lower leg rotation. Mild instability with valgus force bilaterally Negative Mcmurray's, Apley's, and Thessalonian tests. Continued pain with patellar compression Patellar glide with moderate crepitus. Patellar and quadriceps tendons unremarkable. Hamstring and quadriceps strength is normal.  No significant change from previous exam possible worsening  After informed written and verbal consent, patient was seated on exam table. Right knee was prepped with alcohol swab and utilizing anterolateral approach, patient's right knee space was injected with 15 mg/2.5 mL of Orthovisc(sodium hyaluronate) in a prefilled syringe was injected easily into the knee through a 22-gauge needle... Patient tolerated the procedure well without immediate complications.  After informed written and verbal consent, patient was seated on exam table. Left knee was prepped with alcohol swab and utilizing anterolateral approach, patient's left knee space was injected with 15 mg/2.5 mL of Orthovisc(sodium hyaluronate) in a prefilled syringe was injected easily into the knee through a 22-gauge needle... Patient tolerated the procedure well without immediate  complications.   Impression and Recommendations:     This case required medical decision making of moderate complexity.

## 2016-04-29 NOTE — Patient Instructions (Signed)
Good to see you  Ice is your friend Stay active Have a great trip  See you again in 2 weeks for the second injection.

## 2016-05-13 ENCOUNTER — Encounter: Payer: Self-pay | Admitting: Family Medicine

## 2016-05-13 ENCOUNTER — Ambulatory Visit (INDEPENDENT_AMBULATORY_CARE_PROVIDER_SITE_OTHER): Payer: Managed Care, Other (non HMO) | Admitting: Family Medicine

## 2016-05-13 VITALS — BP 136/80 | HR 74 | Wt 215.0 lb

## 2016-05-13 DIAGNOSIS — M170 Bilateral primary osteoarthritis of knee: Secondary | ICD-10-CM | POA: Diagnosis not present

## 2016-05-13 NOTE — Progress Notes (Signed)
[name] [address], [place] [address] Phone: [phone] Subjective:    I'm seeing this patient by the request  of:  Mauricio Po, FNP   CC: Bilateral knee pain follow-up  [id number] Shane [name] is a 65 y.o. male coming in with complaint of bilateral knee pain. Was seen one month ago and started having worsening symptoms. Failed all other conservative therapy. Patient is here for second in a series of 4 injections for viscous supplementation. Patient states he has noticed some improvement from previous exam. Patient states that the injections were helpful. Patient was last seen 2 weeks ago and has been doing well. Patient did do a lot of walking on the beach and played with his 43-year-old granddaughter and felt like he was doing very well.   Past Medical History  Diagnosis Date  . Arthritis   . Allergy    No past surgical history on file. Social History  Substance Use Topics  . Smoking status: Former Smoker -- 1.00 packs/day for 10 years    Types: Cigarettes    Quit date: 08/09/1978  . Smokeless tobacco: Never Used  . Alcohol Use: No   No Known Allergies Family History  Problem Relation Age of Onset  . Healthy Mother   . Hypertension Father   . Stroke Father         Past medical history, social, surgical and family history all reviewed in electronic medical record.   Review of Systems: No headache, visual changes, nausea, vomiting, diarrhea, constipation, dizziness, abdominal pain, skin rash, fevers, chills, night sweats, weight loss, swollen lymph nodes, body aches,muscle aches, chest pain, shortness of breath, mood changes.   Objective Blood pressure 136/80, pulse 74, weight 215 lb (97.523 kg).  General: No apparent distress alert and oriented x3 mood and affect normal, dressed appropriately.  HEENT: Pupils equal, extraocular movements intact  Respiratory: Patient's speak in full sentences and does not appear  short of breath  Cardiovascular: No lower extremity edema, non tender, no erythema  Skin: Warm dry intact with no signs of infection or rash on extremities or on axial skeleton.  Abdomen: Soft nontender  Neuro: Cranial nerves II through XII are intact, neurovascularly intact in all extremities with 2+ DTRs and 2+ pulses.  Lymph: No lymphadenopathy of posterior or anterior cervical chain or axillae bilaterally.  Gait normal with good balance and coordination.  MSK:  Non tender with full range of motion and good stability and symmetric strength and tone of shoulders, elbows, wrist, hip, and ankles bilaterally.     Knee: Bilateral Mild varus deformity of the knees bilaterally Tender to palpation over the medial joint lines ROM full in flexion and extension and lower leg rotation. Mild instability with valgus force bilaterally Negative Mcmurray's, Apley's, and Thessalonian tests. Continued pain with patellar compression Patellar glide with moderate crepitus. Patellar and quadriceps tendons unremarkable. Hamstring and quadriceps strength is normal.  No significant change from previous exam   After informed written and verbal consent, patient was seated on exam table. Right knee was prepped with alcohol swab and utilizing anterolateral approach, patient's right knee space was injected with 15 mg/2.5 mL of Orthovisc(sodium hyaluronate) in a prefilled syringe was injected easily into the knee through a 22-gauge needle... Patient tolerated the procedure well without immediate complications.  After informed written and verbal consent, patient was seated on exam table. Left knee was prepped with alcohol swab and utilizing anterolateral approach, patient's left knee space was injected with  15 mg/2.5 mL of Orthovisc(sodium hyaluronate) in a prefilled syringe was injected easily into the knee through a 22-gauge needle... Patient tolerated the procedure well without immediate complications.   Impression  and Recommendations:     This case required medical decision making of moderate complexity.

## 2016-05-13 NOTE — Patient Instructions (Signed)
Good to see you  2 down 2 to go! Ice in 6 hours.  Stay active and continue everything else See you again next week

## 2016-05-13 NOTE — Assessment & Plan Note (Signed)
Second in a series of 4 injections given bilateral today. Seems to be making some progress already. Follow-up in one week for third in a series of 4 injections. Continue conservative therapy.

## 2016-05-20 ENCOUNTER — Ambulatory Visit (INDEPENDENT_AMBULATORY_CARE_PROVIDER_SITE_OTHER): Payer: Managed Care, Other (non HMO) | Admitting: Family Medicine

## 2016-05-20 ENCOUNTER — Encounter: Payer: Self-pay | Admitting: Family Medicine

## 2016-05-20 VITALS — BP 128/82 | HR 81 | Ht 71.0 in | Wt 218.0 lb

## 2016-05-20 DIAGNOSIS — M170 Bilateral primary osteoarthritis of knee: Secondary | ICD-10-CM

## 2016-05-20 NOTE — Progress Notes (Signed)
[name] [address], [place] [address] Phone: [phone] Subjective:    I'm seeing this patient by the request  of:  Mauricio Po, FNP   CC: Bilateral knee pain follow-up  [id number] Shane [name] is a 65 y.o. male coming in with complaint of bilateral knee pain. Was seen one month ago and started having worsening symptoms. Failed all other conservative therapy. Patient is here for third injection of viscous supplementation. Has noticed significant improvement. Continues to do more activity. Notices it easier to go from a sitting to standing position.   Past Medical History  Diagnosis Date  . Arthritis   . Allergy    No past surgical history on file. Social History  Substance Use Topics  . Smoking status: Former Smoker -- 1.00 packs/day for 10 years    Types: Cigarettes    Quit date: 08/09/1978  . Smokeless tobacco: Never Used  . Alcohol Use: No   No Known Allergies Family History  Problem Relation Age of Onset  . Healthy Mother   . Hypertension Father   . Stroke Father         Past medical history, social, surgical and family history all reviewed in electronic medical record.   Review of Systems: No headache, visual changes, nausea, vomiting, diarrhea, constipation, dizziness, abdominal pain, skin rash, fevers, chills, night sweats, weight loss, swollen lymph nodes, body aches,muscle aches, chest pain, shortness of breath, mood changes.   Objective Blood pressure 128/82, pulse 81, height 5\' 11"  (1.803 m), weight 218 lb (98.884 kg), SpO2 98 %.  General: No apparent distress alert and oriented x3 mood and affect normal, dressed appropriately.  HEENT: Pupils equal, extraocular movements intact  Respiratory: Patient's speak in full sentences and does not appear short of breath  Cardiovascular: No lower extremity edema, non tender, no erythema  Skin: Warm dry intact with no signs of infection or rash on extremities or on  axial skeleton.  Abdomen: Soft nontender  Neuro: Cranial nerves II through XII are intact, neurovascularly intact in all extremities with 2+ DTRs and 2+ pulses.  Lymph: No lymphadenopathy of posterior or anterior cervical chain or axillae bilaterally.  Gait normal with good balance and coordination.  MSK:  Non tender with full range of motion and good stability and symmetric strength and tone of shoulders, elbows, wrist, hip, and ankles bilaterally.     Knee: Bilateral Mild varus deformity of the knees bilaterally Nontender today which is improvement ROM full in flexion and extension and lower leg rotation. Mild instability with valgus force bilaterally Negative Mcmurray's, Apley's, and Thessalonian tests. Minimal pain with patellar compression Patellar glide with moderate crepitus. Patellar and quadriceps tendons unremarkable. Hamstring and quadriceps strength is normal.  Improvement from previous exam  After informed written and verbal consent, patient was seated on exam table. Right knee was prepped with alcohol swab and utilizing anterolateral approach, patient's right knee space was injected with 15 mg/2.5 mL of Orthovisc(sodium hyaluronate) in a prefilled syringe was injected easily into the knee through a 22-gauge needle... Patient tolerated the procedure well without immediate complications.  After informed written and verbal consent, patient was seated on exam table. Left knee was prepped with alcohol swab and utilizing anterolateral approach, patient's left knee space was injected with 15 mg/2.5 mL of Orthovisc(sodium hyaluronate) in a prefilled syringe was injected easily into the knee through a 22-gauge needle... Patient tolerated the procedure well without immediate complications.   Impression and Recommendations:  This case required medical decision making of moderate complexity.

## 2016-05-20 NOTE — Assessment & Plan Note (Signed)
Patient is done with 3 out of the 4 injections. Patient will continue to be active. We discussed icing regimen. Continue the other conservative therapy. Return in one week for fourth and final injection.

## 2016-05-20 NOTE — Patient Instructions (Signed)
One more to go! Ice is your friend ' Don't change a thing See you again next week

## 2016-05-25 ENCOUNTER — Ambulatory Visit (INDEPENDENT_AMBULATORY_CARE_PROVIDER_SITE_OTHER): Payer: Managed Care, Other (non HMO) | Admitting: Family Medicine

## 2016-05-25 ENCOUNTER — Encounter: Payer: Self-pay | Admitting: Family Medicine

## 2016-05-25 VITALS — BP 122/78 | HR 72

## 2016-05-25 DIAGNOSIS — M170 Bilateral primary osteoarthritis of knee: Secondary | ICD-10-CM

## 2016-05-25 NOTE — Progress Notes (Signed)
[name] [address], [place] [address] Phone: [phone] Subjective:    I'm seeing this patient by the request  of:  Mauricio Po, FNP   CC: Bilateral knee pain follow-up  [id number] Shane [name] is a 65 y.o. male coming in with complaint of bilateral knee pain. Was seen one month ago and started having worsening symptoms. Failed all other conservative therapy. Here for fourth injection of Orthovisc. Has been doing significantly better. Only having pain on the anterior aspect of the knee. Very minimal. Only when he puts pressure on it.   Past Medical History  Diagnosis Date  . Arthritis   . Allergy    No past surgical history on file. Social History  Substance Use Topics  . Smoking status: Former Smoker -- 1.00 packs/day for 10 years    Types: Cigarettes    Quit date: 08/09/1978  . Smokeless tobacco: Never Used  . Alcohol Use: No   No Known Allergies Family History  Problem Relation Age of Onset  . Healthy Mother   . Hypertension Father   . Stroke Father         Past medical history, social, surgical and family history all reviewed in electronic medical record.   Review of Systems: No headache, visual changes, nausea, vomiting, diarrhea, constipation, dizziness, abdominal pain, skin rash, fevers, chills, night sweats, weight loss, swollen lymph nodes, body aches,muscle aches, chest pain, shortness of breath, mood changes.   Objective Blood pressure 122/78, pulse 72.  General: No apparent distress alert and oriented x3 mood and affect normal, dressed appropriately.  HEENT: Pupils equal, extraocular movements intact  Respiratory: Patient's speak in full sentences and does not appear short of breath  Cardiovascular: No lower extremity edema, non tender, no erythema  Skin: Warm dry intact with no signs of infection or rash on extremities or on axial skeleton.  Abdomen: Soft nontender  Neuro: Cranial nerves II through  XII are intact, neurovascularly intact in all extremities with 2+ DTRs and 2+ pulses.  Lymph: No lymphadenopathy of posterior or anterior cervical chain or axillae bilaterally.  Gait normal with good balance and coordination.  MSK:  Non tender with full range of motion and good stability and symmetric strength and tone of shoulders, elbows, wrist, hip, and ankles bilaterally.     Knee: Bilateral Mild varus deformity of the knees bilaterally Nontender today which is improvement ROM full in flexion and extension and lower leg rotation. Mild instability with valgus force bilaterally Negative Mcmurray's, Apley's, and Thessalonian tests. Minimal pain with patellar compression Patellar glide with moderate crepitus. Patellar and quadriceps tendons unremarkable. Hamstring and quadriceps strength is normal.  Continued improvement  After informed written and verbal consent, patient was seated on exam table. Right knee was prepped with alcohol swab and utilizing anterolateral approach, patient's right knee space was injected with 15 mg/2.5 mL of Orthovisc(sodium hyaluronate) in a prefilled syringe was injected easily into the knee through a 22-gauge needle... Patient tolerated the procedure well without immediate complications.  After informed written and verbal consent, patient was seated on exam table. Left knee was prepped with alcohol swab and utilizing anterolateral approach, patient's left knee space was injected with 15 mg/2.5 mL of Orthovisc(sodium hyaluronate) in a prefilled syringe was injected easily into the knee through a 22-gauge needle... Patient tolerated the procedure well without immediate complications.   Impression and Recommendations:     This case required medical decision making of moderate complexity.

## 2016-05-25 NOTE — Assessment & Plan Note (Signed)
Fourth and final injection today. Patient will continue conservative therapy. Follow-up as needed.

## 2016-05-27 ENCOUNTER — Ambulatory Visit: Payer: Managed Care, Other (non HMO) | Admitting: Family Medicine

## 2016-08-18 ENCOUNTER — Other Ambulatory Visit: Payer: Self-pay | Admitting: Family

## 2016-08-18 MED ORDER — DICLOFENAC SODIUM 75 MG PO TBEC: 75.0000 mg | DELAYED_RELEASE_TABLET | Freq: Two times a day (BID) | ORAL | 0 refills | Status: DC

## 2016-09-25 ENCOUNTER — Telehealth: Payer: Self-pay | Admitting: Emergency Medicine

## 2016-09-25 ENCOUNTER — Other Ambulatory Visit: Payer: Self-pay | Admitting: Family

## 2016-09-25 MED ORDER — SILDENAFIL CITRATE 100 MG PO TABS: 50.0000 mg | ORAL_TABLET | Freq: Every day | ORAL | 4 refills | Status: DC | PRN

## 2016-09-25 NOTE — Telephone Encounter (Signed)
Pt informed rx was sent.  

## 2016-09-25 NOTE — Telephone Encounter (Signed)
Pt lmmom rq viagra. Pls advise

## 2016-09-25 NOTE — Telephone Encounter (Signed)
Pt called and needs a prescription refill on his sildenafil (VIAGRA) 100 MG tablet. Pharmacy is [facility]:[address]. Please advise thanks.

## 2016-09-25 NOTE — Telephone Encounter (Signed)
Medication sent.

## 2016-09-25 NOTE — Addendum Note (Signed)
Addended by: Mauricio Po D on: 09/25/2016 04:18 PM   Modules accepted: Orders

## 2016-10-20 ENCOUNTER — Other Ambulatory Visit: Payer: Self-pay | Admitting: Family

## 2016-10-22 ENCOUNTER — Other Ambulatory Visit (INDEPENDENT_AMBULATORY_CARE_PROVIDER_SITE_OTHER): Payer: Managed Care, Other (non HMO)

## 2016-10-22 ENCOUNTER — Ambulatory Visit (INDEPENDENT_AMBULATORY_CARE_PROVIDER_SITE_OTHER): Payer: Managed Care, Other (non HMO) | Admitting: Family

## 2016-10-22 ENCOUNTER — Encounter: Payer: Self-pay | Admitting: Family

## 2016-10-22 VITALS — BP 148/72 | HR 84 | Temp 98.1°F | Resp 16 | Ht 71.0 in | Wt 221.0 lb

## 2016-10-22 DIAGNOSIS — Z0000 Encounter for general adult medical examination without abnormal findings: Secondary | ICD-10-CM

## 2016-10-22 DIAGNOSIS — Z23 Encounter for immunization: Secondary | ICD-10-CM

## 2016-10-22 DIAGNOSIS — M5030 Other cervical disc degeneration, unspecified cervical region: Secondary | ICD-10-CM

## 2016-10-22 LAB — COMPREHENSIVE METABOLIC PANEL
ALK PHOS: 89 U/L (ref 39–117)
ALT: 22 U/L (ref 0–53)
AST: 19 U/L (ref 0–37)
Albumin: 4.3 g/dL (ref 3.5–5.2)
BILIRUBIN TOTAL: 0.8 mg/dL (ref 0.2–1.2)
BUN: 16 mg/dL (ref 6–23)
CO2: 30 mEq/L (ref 19–32)
Calcium: 9.4 mg/dL (ref 8.4–10.5)
Chloride: 104 mEq/L (ref 96–112)
Creatinine, Ser: 0.85 mg/dL (ref 0.40–1.50)
GFR: 115.63 mL/min (ref >60.00)
GLUCOSE: 91 mg/dL (ref 70–99)
Potassium: 4 mEq/L (ref 3.5–5.1)
SODIUM: 138 meq/L (ref 135–145)
TOTAL PROTEIN: 6.6 g/dL (ref 6.0–8.3)

## 2016-10-22 LAB — LIPID PANEL
Cholesterol: 147 mg/dL (ref 0–200)
HDL: 32.7 mg/dL — ABNORMAL LOW (ref >39.00)
LDL Cholesterol: 101 mg/dL — ABNORMAL HIGH (ref 0–99)
NONHDL: 114.17
Total CHOL/HDL Ratio: 4
Triglycerides: 65 mg/dL (ref 0.0–149.0)
VLDL: 13 mg/dL (ref 0.0–40.0)

## 2016-10-22 LAB — PSA: PSA: 0.37 ng/mL (ref 0.10–4.00)

## 2016-10-22 MED ORDER — DICLOFENAC SODIUM 75 MG PO TBEC: 75.0000 mg | DELAYED_RELEASE_TABLET | Freq: Two times a day (BID) | ORAL | 0 refills | Status: DC

## 2016-10-22 MED ORDER — GABAPENTIN 100 MG PO CAPS: 200.0000 mg | ORAL_CAPSULE | Freq: Every day | ORAL | 3 refills | Status: DC

## 2016-10-22 NOTE — Assessment & Plan Note (Signed)
Worsening symptoms of degenerative cervical disc disease with increased numbness and tingling. Restart gabapentin. Continue current dosage of diclofenac. Refer to physical therapy and continue home exercise program with ice/moist heat. May require additional imaging such as MRI and possible cortisone injections if physical therapy unsuccessful.

## 2016-10-22 NOTE — Patient Instructions (Addendum)
Thank you for choosing [facility].  SUMMARY AND INSTRUCTIONS:  Please ice / moist heat x 20 minutes every 2 hours as needed.  Stretches and exercises daily.  They will call to schedule your appointment with physical therapy.    Medication:  Your prescription(s) have been submitted to your pharmacy or been printed and provided for you. Please take as directed and contact our office if you believe you are having problem(s) with the medication(s) or have any questions.  Labs:  Please stop by the lab on the lower level of the building for your blood work. Your results will be released to [facility] (or called to you) after review, usually within 72 hours after test completion. If any changes need to be made, you will be notified at that same time.  1.) The lab is open from 7:30am to 5:30 pm Monday-Friday 2.) No appointment is necessary 3.) Fasting (if needed) is 6-8 hours after food and drink; black coffee and water are okay   Cervical Strain and Sprain Rehab Ask your health care provider which exercises are safe for you. Do exercises exactly as told by your health care provider and adjust them as directed. It is normal to feel mild stretching, pulling, tightness, or discomfort as you do these exercises, but you should stop right away if you feel sudden pain or your pain gets worse.Do not begin these exercises until told by your health care provider. Stretching and range of motion exercises These exercises warm up your muscles and joints and improve the movement and flexibility of your neck. These exercises also help to relieve pain, numbness, and tingling. Exercise A: Cervical side bend 1. Using good posture, sit on a stable chair or stand up. 2. Without moving your shoulders, slowly tilt your left / right ear to your shoulder until you feel a stretch in your neck muscles. You should be looking straight ahead. 3. Hold for __________ seconds. 4. Repeat with the other side of your  neck. Repeat __________ times. Complete this exercise __________ times a day. Exercise B: Cervical rotation 1. Using good posture, sit on a stable chair or stand up. 2. Slowly turn your head to the side as if you are looking over your left / right shoulder.  Keep your eyes level with the ground.  Stop when you feel a stretch along the side and the back of your neck. 3. Hold for __________ seconds. 4. Repeat this by turning to your other side. Repeat __________ times. Complete this exercise __________ times a day. Exercise C: Thoracic extension and pectoral stretch 1. Roll a towel or a small blanket so it is about 4 inches (10 cm) in diameter. 2. Lie down on your back on a firm surface. 3. Put the towel lengthwise, under your spine in the middle of your back. It should not be not under your shoulder blades. The towel should line up with your spine from your middle back to your lower back. 4. Put your hands behind your head and let your elbows fall out to your sides. 5. Hold for __________ seconds. Repeat __________ times. Complete this exercise __________ times a day. Strengthening exercises These exercises build strength and endurance in your neck. Endurance is the ability to use your muscles for a long time, even after your muscles get tired. Exercise D: Upper cervical flexion, isometric 1. Lie on your back with a thin pillow behind your head and a small rolled-up towel under your neck. 2. Gently tuck your chin toward  your chest and nod your head down to look toward your feet. Do not lift your head off the pillow. 3. Hold for __________ seconds. 4. Release the tension slowly. Relax your neck muscles completely before you repeat this exercise. Repeat __________ times. Complete this exercise __________ times a day. Exercise E: Cervical extension, isometric 1. Stand about 6 inches (15 cm) away from a wall, with your back facing the wall. 2. Place a soft object, about 6-8 inches (15-20 cm)  in diameter, between the back of your head and the wall. A soft object could be a small pillow, a ball, or a folded towel. 3. Gently tilt your head back and press into the soft object. Keep your jaw and forehead relaxed. 4. Hold for __________ seconds. 5. Release the tension slowly. Relax your neck muscles completely before you repeat this exercise. Repeat __________ times. Complete this exercise __________ times a day. Posture and body mechanics   Body mechanics refers to the movements and positions of your body while you do your daily activities. Posture is part of body mechanics. Good posture and healthy body mechanics can help to relieve stress in your body's tissues and joints. Good posture means that your spine is in its natural S-curve position (your spine is neutral), your shoulders are pulled back slightly, and your head is not tipped forward. The following are general guidelines for applying improved posture and body mechanics to your everyday activities. Standing  When standing, keep your spine neutral and keep your feet about hip-width apart. Keep a slight bend in your knees. Your ears, shoulders, and hips should line up.  When you do a task in which you stand in one place for a long time, place one foot up on a stable object that is 2-4 inches (5-10 cm) high, such as a footstool. This helps keep your spine neutral. Sitting  When sitting, keep your spine neutral and your keep feet flat on the floor. Use a footrest, if necessary, and keep your thighs parallel to the floor. Avoid rounding your shoulders, and avoid tilting your head forward.  When working at a desk or a computer, keep your desk at a height where your hands are slightly lower than your elbows. Slide your chair under your desk so you are close enough to maintain good posture.  When working at a computer, place your monitor at a height where you are looking straight ahead and you do not have to tilt your head forward or  downward to look at the screen. Resting When lying down and resting, avoid positions that are most painful for you. Try to support your neck in a neutral position. You can use a contour pillow or a small rolled-up towel. Your pillow should support your neck but not push on it. This information is not intended to replace advice given to you by your health care provider. Make sure you discuss any questions you have with your health care provider. Document Released: 10/26/2005 Document Revised: 07/02/2016 Document Reviewed: 10/02/2015 Elsevier Interactive Patient Education  2017 [place].  Follow up:  If your symptoms worsen or fail to improve, please contact our office for further instruction, or in case of emergency go directly to the emergency room at the closest medical facility.    Health Maintenance, Male A healthy lifestyle and preventative care can promote health and wellness.  Maintain regular health, dental, and eye exams.  Eat a healthy diet. Foods like vegetables, fruits, whole grains, low-fat dairy products, and lean  protein foods contain the nutrients you need and are low in calories. Decrease your intake of foods high in solid fats, added sugars, and salt. Get information about a proper diet from your health care provider, if necessary.  Regular physical exercise is one of the most important things you can do for your health. Most adults should get at least 150 minutes of moderate-intensity exercise (any activity that increases your heart rate and causes you to sweat) each week. In addition, most adults need muscle-strengthening exercises on 2 or more days a week.   Maintain a healthy weight. The body mass index (BMI) is a screening tool to identify possible weight problems. It provides an estimate of body fat based on height and weight. Your health care provider can find your BMI and can help you achieve or maintain a healthy weight. For males 20 years and older:  A BMI  below 18.5 is considered underweight.  A BMI of 18.5 to 24.9 is normal.  A BMI of 25 to 29.9 is considered overweight.  A BMI of 30 and above is considered obese.  Maintain normal blood lipids and cholesterol by exercising and minimizing your intake of saturated fat. Eat a balanced diet with plenty of fruits and vegetables. Blood tests for lipids and cholesterol should begin at age 64 and be repeated every 5 years. If your lipid or cholesterol levels are high, you are over age 15, or you are at high risk for heart disease, you may need your cholesterol levels checked more frequently.Ongoing high lipid and cholesterol levels should be treated with medicines if diet and exercise are not working.  If you smoke, find out from your health care provider how to quit. If you do not use tobacco, do not start.  Lung cancer screening is recommended for adults aged 27-80 years who are at high risk for developing lung cancer because of a history of smoking. A yearly low-dose CT scan of the lungs is recommended for people who have at least a 30-pack-year history of smoking and are current smokers or have quit within the past 15 years. A pack year of smoking is smoking an average of 1 pack of cigarettes a day for 1 year (for example, a 30-pack-year history of smoking could mean smoking 1 pack a day for 30 years or 2 packs a day for 15 years). Yearly screening should continue until the smoker has stopped smoking for at least 15 years. Yearly screening should be stopped for people who develop a health problem that would prevent them from having lung cancer treatment.  If you choose to drink alcohol, do not have more than 2 drinks per day. One drink is considered to be 12 oz (360 mL) of beer, 5 oz (150 mL) of wine, or 1.5 oz (45 mL) of liquor.  Avoid the use of street drugs. Do not share needles with anyone. Ask for help if you need support or instructions about stopping the use of drugs.  High blood pressure  causes heart disease and increases the risk of stroke. High blood pressure is more likely to develop in:  People who have blood pressure in the end of the normal range (100-139/85-89 mm Hg).  People who are overweight or obese.  People who are African American.  If you are 64-46 years of age, have your blood pressure checked every 3-5 years. If you are 82 years of age or older, have your blood pressure checked every year. You should have your blood pressure  measured twice-once when you are at a hospital or clinic, and once when you are not at a hospital or clinic. Record the average of the two measurements. To check your blood pressure when you are not at a hospital or clinic, you can use:  An automated blood pressure machine at a pharmacy.  A home blood pressure monitor.  If you are 1-18 years old, ask your health care provider if you should take aspirin to prevent heart disease.  Diabetes screening involves taking a blood sample to check your fasting blood sugar level. This should be done once every 3 years after age 86 if you are at a normal weight and without risk factors for diabetes. Testing should be considered at a younger age or be carried out more frequently if you are overweight and have at least 1 risk factor for diabetes.  Colorectal cancer can be detected and often prevented. Most routine colorectal cancer screening begins at the age of 30 and continues through age 35. However, your health care provider may recommend screening at an earlier age if you have risk factors for colon cancer. On a yearly basis, your health care provider may provide home test kits to check for hidden blood in the stool. A small camera at the end of a tube may be used to directly examine the colon (sigmoidoscopy or colonoscopy) to detect the earliest forms of colorectal cancer. Talk to your health care provider about this at age 45 when routine screening begins. A direct exam of the colon should be repeated  every 5-10 years through age 59, unless early forms of precancerous polyps or small growths are found.  People who are at an increased risk for hepatitis B should be screened for this virus. You are considered at high risk for hepatitis B if:  You were born in a country where hepatitis B occurs often. Talk with your health care provider about which countries are considered high risk.  Your parents were born in a high-risk country and you have not received a shot to protect against hepatitis B (hepatitis B vaccine).  You have HIV or AIDS.  You use needles to inject street drugs.  You live with, or have sex with, someone who has hepatitis B.  You are a man who has sex with other men (MSM).  You get hemodialysis treatment.  You take certain medicines for conditions like cancer, organ transplantation, and autoimmune conditions.  Hepatitis C blood testing is recommended for all people born from [birth year] through 1967 and any individual with known risk factors for hepatitis C.  Healthy men should no longer receive prostate-specific antigen (PSA) blood tests as part of routine cancer screening. Talk to your health care provider about prostate cancer screening.  Testicular cancer screening is not recommended for adolescents or adult males who have no symptoms. Screening includes self-exam, a health care provider exam, and other screening tests. Consult with your health care provider about any symptoms you have or any concerns you have about testicular cancer.  Practice safe sex. Use condoms and avoid high-risk sexual practices to reduce the spread of sexually transmitted infections (STIs).  You should be screened for STIs, including gonorrhea and chlamydia if:  You are sexually active and are younger than 24 years.  You are older than 24 years, and your health care provider tells you that you are at risk for this type of infection.  Your sexual activity has changed since you were last screened,  and you are at  an increased risk for chlamydia or gonorrhea. Ask your health care provider if you are at risk.  If you are at risk of being infected with HIV, it is recommended that you take a prescription medicine daily to prevent HIV infection. This is called pre-exposure prophylaxis (PrEP). You are considered at risk if:  You are a man who has sex with other men (MSM).  You are a heterosexual man who is sexually active with multiple partners.  You take drugs by injection.  You are sexually active with a partner who has HIV.  Talk with your health care provider about whether you are at high risk of being infected with HIV. If you choose to begin PrEP, you should first be tested for HIV. You should then be tested every 3 months for as long as you are taking PrEP.  Use sunscreen. Apply sunscreen liberally and repeatedly throughout the day. You should seek shade when your shadow is shorter than you. Protect yourself by wearing long sleeves, pants, a wide-brimmed hat, and sunglasses year round whenever you are outdoors.  Tell your health care provider of new moles or changes in moles, especially if there is a change in shape or color. Also, tell your health care provider if a mole is larger than the size of a pencil eraser.  A one-time screening for abdominal aortic aneurysm (AAA) and surgical repair of large AAAs by ultrasound is recommended for men aged 39-75 years who are current or former smokers.  Stay current with your vaccines (immunizations). This information is not intended to replace advice given to you by your health care provider. Make sure you discuss any questions you have with your health care provider. Document Released: 04/23/2008 Document Revised: 11/16/2014 Document Reviewed: 07/30/2015 Elsevier Interactive Patient Education  2017 [employer].

## 2016-10-22 NOTE — Assessment & Plan Note (Addendum)
1) Anticipatory Guidance: Discussed importance of wearing a seatbelt while driving and not texting while driving; changing batteries in smoke detector at least once annually; wearing suntan lotion when outside; eating a balanced and moderate diet; getting physical activity at least 30 minutes per day.  2) Immunizations / Screenings / Labs:  Influenza and Pneumovax updated today. Plan for Zostavax discussed. All other immunizations are up-to-date per recommendations.Obtain PSA for prostate cancer screening. Obtain hepatitis C antibody for hepatitis C screening. Due for a dental exam encouraged to be completed independently. All other screenings are up-to-date per recommendations. Obtain CBC, CMET, and lipid profile.    Overall well exam with risk factors for cardiovascular disease including obesity. Recommend weight loss of 5-10% of current body weight through nutrition and physical activity. Overall he has had a good healthful year. Continue other healthy lifestyle behaviors and choices. Follow-up prevention exam in 1 year. Follow-up office visit pending blood work and for chronic conditions.

## 2016-10-22 NOTE — Progress Notes (Signed)
Subjective:    [name], male    DOB: Jun 20, 1951, 65 y.o.   MRN: [id number]  Chief Complaint  Patient presents with  . CPE    fasting    HPI:  Shane [name] is a 65 y.o. male who presents today for an annual wellness visit.   1) Health Maintenance -   Diet - Averages about 1-2 meals per day consisting of a regular diet; Caffeine intake of 2-3 cups per day  Exercise - No structured exercise; at work on occasion.    2) Preventative Exams / Immunizations:  Dental -- Due for exam; Scheduled for January  Vision -- Up to date   Health Maintenance  Topic Date Due  . Hepatitis C Screening  1951/07/05  . ZOSTAVAX  10/22/2009  . INFLUENZA VACCINE  06/09/2016  . PNA vac Low Risk Adult (2 of 2 - PPSV23) 09/01/2016  . COLONOSCOPY  10/22/2026 (Originally 10/23/1999)  . TETANUS/TDAP  08/26/2025    Immunization History  Administered Date(s) Administered  . Influenza, High Dose Seasonal PF 10/22/2016  . Influenza,inj,Quad PF,36+ Mos 08/27/2015  . Pneumococcal Conjugate-13 09/02/2015  . Tdap 08/27/2015    3.) Numbness of right upper extremity - continues to experience associated symptom of numbness and tingling located in his right upper extremity that travels from the fingertips up through the cervical spine. Previous x-rays showed degenerative cervical disc. Modifying factors include gabapentin which he has not taken in several weeks. Continues to use diclofenac gel. Overall course of the symptoms appear to be worsen with the timing of the symptoms generally worse at night.  No Known Allergies   Outpatient Medications Prior to Visit  Medication Sig Dispense Refill  . Diclofenac Sodium (PENNSAID) 2 % SOLN Place 2 application onto the skin 2 (two) times daily. 112 g 3  . sildenafil (VIAGRA) 100 MG tablet Take 0.5-1 tablets (50-100 mg total) by mouth daily as needed for erectile dysfunction. 5 tablet 4  . diclofenac (VOLTAREN) 75 MG EC tablet Take 1 tablet  (75 mg total) by mouth 2 (two) times daily. Yearly physical is due must see Marya Amsler for future refills 60 tablet 0  . gabapentin (NEURONTIN) 100 MG capsule Take 2 capsules (200 mg total) by mouth at bedtime. 60 capsule 3   No facility-administered medications prior to visit.      Past Medical History:  Diagnosis Date  . Allergy   . Arthritis      History reviewed. No pertinent surgical history.   Family History  Problem Relation Age of Onset  . Healthy Mother   . Hypertension Father   . Stroke Father      Social History   Social History  . Marital status: Married    Spouse name: N/A  . Number of children: 2  . Years of education: 14   Occupational History  . Service Technician    Social History Main Topics  . Smoking status: Former Smoker    Packs/day: 1.00    Years: 10.00    Types: Cigarettes    Quit date: 08/09/1978  . Smokeless tobacco: Never Used  . Alcohol use No  . Drug use: No  . Sexual activity: Not on file   Other Topics Concern  . Not on file   Social History Narrative   Fun: [name] out with his grandkids, 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      Review of Systems  Constitutional: Denies fever, chills, fatigue, or significant weight gain/loss. HENT: Head: Denies  headache or neck pain Ears: Denies changes in hearing, ringing in ears, earache, drainage Nose: Denies discharge, stuffiness, itching, nosebleed, sinus pain Throat: Denies sore throat, hoarseness, dry mouth, sores, thrush Eyes: Denies loss/changes in vision, pain, redness, blurry/double vision, flashing lights Cardiovascular: Denies chest pain/discomfort, tightness, palpitations, shortness of breath with activity, difficulty lying down, swelling, sudden awakening with shortness of breath Respiratory: Denies shortness of breath, cough, sputum production, wheezing Gastrointestinal: Denies dysphasia, heartburn, change in appetite, nausea, change in bowel habits, rectal bleeding, constipation, diarrhea,  yellow skin or eyes Genitourinary: Denies frequency, urgency, burning/pain, blood in urine, incontinence, change in urinary strength. Musculoskeletal: Denies muscle/joint pain, stiffness, back pain, redness or swelling of joints, trauma Skin: Denies rashes, lumps, itching, dryness, color changes, or hair/nail changes Neurological: Denies dizziness, fainting, seizures, weakness, numbness, tingling, tremor Psychiatric - Denies nervousness, stress, depression or memory loss Endocrine: Denies heat or cold intolerance, sweating, frequent urination, excessive thirst, changes in appetite Hematologic: Denies ease of bruising or bleeding     Objective:     BP (!) 148/72 (BP Location: Left Arm, Patient Position: Sitting, Cuff Size: Large)   Pulse 84   Temp 98.1 F (36.7 C) (Oral)   Resp 16   Ht 5\' 11"  (1.803 m)   Wt 221 lb (100.2 kg)   SpO2 98%   BMI 30.82 kg/m  Nursing note and vital signs reviewed.  Physical Exam  Constitutional: He is oriented to person, place, and time. He appears well-developed and well-nourished.  HENT:  Head: Normocephalic.  Right Ear: Hearing, tympanic membrane, external ear and ear canal normal.  Left Ear: Hearing, tympanic membrane, external ear and ear canal normal.  Nose: Nose normal.  Mouth/Throat: Uvula is midline, oropharynx is clear and moist and mucous membranes are normal.  Eyes: Conjunctivae and EOM are normal. Pupils are equal, round, and reactive to light.  Neck: Neck supple. No JVD present. No tracheal deviation present. No thyromegaly present.  No obvious deformity, discoloration, or edema.Tenderness elicited over her cervical paraspinal musculature bilaterally. No crepitus or deformity noted.Range of motion decreased in rotation and lateral bending. Cervical compression test negative.Dstal pulses and sensation are intact and appropriate.  Cardiovascular: Normal rate, regular rhythm, normal heart sounds and intact distal pulses.   Pulmonary/Chest:  Effort normal and breath sounds normal.  Abdominal: Soft. Bowel sounds are normal. He exhibits no distension and no mass. There is no tenderness. There is no rebound and no guarding.  Musculoskeletal: Normal range of motion. He exhibits no edema or tenderness.  Lymphadenopathy:    He has no cervical adenopathy.  Neurological: He is alert and oriented to person, place, and time. He has normal reflexes. No cranial nerve deficit. He exhibits normal muscle tone. Coordination normal.  Skin: Skin is warm and dry.  Psychiatric: He has a normal mood and affect. His behavior is normal. Judgment and thought content normal.       Assessment & Plan:   Problem List Items Addressed This Visit      Musculoskeletal and Integument   Degenerative cervical disc    Worsening symptoms of degenerative cervical disc disease with increased numbness and tingling. Restart gabapentin. Continue current dosage of diclofenac. Refer to physical therapy and continue home exercise program with ice/moist heat. May require additional imaging such as MRI and possible cortisone injections if physical therapy unsuccessful.      Relevant Medications   diclofenac (VOLTAREN) 75 MG EC tablet   gabapentin (NEURONTIN) 100 MG capsule   Other Relevant Orders  Ambulatory referral to Physical Therapy     Other   Routine general medical examination at a health care facility - Primary (Chronic)    1) Anticipatory Guidance: Discussed importance of wearing a seatbelt while driving and not texting while driving; changing batteries in smoke detector at least once annually; wearing suntan lotion when outside; eating a balanced and moderate diet; getting physical activity at least 30 minutes per day.  2) Immunizations / Screenings / Labs:  Influenza and Pneumovax updated today. Plan for Zostavax discussed. All other immunizations are up-to-date per recommendations.Obtain PSA for prostate cancer screening. Obtain hepatitis C antibody for  hepatitis C screening. Due for a dental exam encouraged to be completed independently. All other screenings are up-to-date per recommendations. Obtain CBC, CMET, and lipid profile.    Overall well exam with risk factors for cardiovascular disease including obesity. Recommend weight loss of 5-10% of current body weight through nutrition and physical activity. Overall he has had a good healthful year. Continue other healthy lifestyle behaviors and choices. Follow-up prevention exam in 1 year. Follow-up office visit pending blood work and for chronic conditions.      Relevant Orders   CBC   Comprehensive metabolic panel   Lipid panel   PSA   Hepatitis C antibody    Other Visit Diagnoses    Encounter for immunization       Relevant Medications   diclofenac (VOLTAREN) 75 MG EC tablet   gabapentin (NEURONTIN) 100 MG capsule   Other Relevant Orders   Flu vaccine HIGH DOSE PF (Completed)   CBC   Comprehensive metabolic panel   Lipid panel   PSA   Hepatitis C antibody       I have changed Mr. Scully's diclofenac. I am also having him maintain his Diclofenac Sodium, sildenafil, and gabapentin.   Meds ordered this encounter  Medications  . diclofenac (VOLTAREN) 75 MG EC tablet    Sig: Take 1 tablet (75 mg total) by mouth 2 (two) times daily.    Dispense:  60 tablet    Refill:  0    Order Specific Question:   Supervising Provider    Answer:   Pricilla Holm A [id number]  . gabapentin (NEURONTIN) 100 MG capsule    Sig: Take 2 capsules (200 mg total) by mouth at bedtime.    Dispense:  60 capsule    Refill:  3    Order Specific Question:   Supervising Provider    Answer:   Pricilla Holm A [id number]     Follow-up: Return in about 2 months (around 12/23/2016), or if symptoms worsen or fail to improve.   Mauricio Po, FNP

## 2016-10-23 LAB — CBC
HCT: 42.8 % (ref 39.0–52.0)
Hemoglobin: 14.5 g/dL (ref 13.0–17.0)
MCHC: 33.8 g/dL (ref 30.0–36.0)
MCV: 90.3 fl (ref 78.0–100.0)
Platelets: 279 10*3/uL (ref 150.0–400.0)
RBC: 4.74 Mil/uL (ref 4.22–5.81)
RDW: 12.2 % (ref 11.5–15.5)
WBC: 5.6 10*3/uL (ref 4.0–10.5)

## 2016-10-23 LAB — HEPATITIS C ANTIBODY: HCV Ab: NEGATIVE

## 2016-10-23 NOTE — Addendum Note (Signed)
Addended by: Delice Bison E on: 10/23/2016 08:56 AM   Modules accepted: Orders

## 2016-10-25 ENCOUNTER — Encounter: Payer: Self-pay | Admitting: Family

## 2016-11-05 ENCOUNTER — Encounter: Payer: Self-pay | Admitting: Family

## 2016-11-05 MED ORDER — PRAVASTATIN SODIUM 20 MG PO TABS: 20.0000 mg | ORAL_TABLET | Freq: Every day | ORAL | 2 refills | Status: DC

## 2016-11-11 ENCOUNTER — Ambulatory Visit: Payer: 59 | Attending: Family

## 2016-11-11 DIAGNOSIS — M436 Torticollis: Secondary | ICD-10-CM | POA: Diagnosis present

## 2016-11-11 DIAGNOSIS — M4722 Other spondylosis with radiculopathy, cervical region: Secondary | ICD-10-CM

## 2016-11-11 NOTE — Therapy (Signed)
[place], [place], [address] Phone: [phone]   Fax:  [phone]  Physical Therapy Evaluation  Patient Details  Name: Shane [name] MRN: [id number] Date of Birth: 1951-02-04 Referring Provider: Mauricio Po PA  Encounter Date: 11/11/2016      PT End of Session - 11/11/16 0841    Visit Number 1   Number of Visits 12   Date for PT Re-Evaluation 12/18/16   PT Start Time 0800   PT Stop Time 0845   PT Time Calculation (min) 45 min   Activity Tolerance Patient tolerated treatment well;No increased pain   Behavior During Therapy WFL for tasks assessed/performed      Past Medical History:  Diagnosis Date  . Allergy   . Arthritis     No past surgical history on file.  There were no vitals filed for this visit.       Subjective Assessment - 11/11/16 0809    Subjective He reports numbness in RT thumb and next 3 digits.  This has been going on for a couple of years.  He is taking medication for this issue. He reports arm symptoms worse with using RT arm at work or  sleeping with neck bent to side. Pain more annoying than painful.   Neck pain mild in RT traps   Patient is accompained by: Family member   Limitations --  does all normal activity without problem.  except with getting to a comfortable position to sleep   Diagnostic tests Xray: DDD   Patient Stated Goals Pt wanted to see neurologist and is here as prelimanary step to see specialist.    Currently in Pain? No/denies   Pain Score --  mild pain at most except without meds at night can be as high as 7/10   Pain Location Neck   Pain Orientation Right   Pain Descriptors / Indicators Sore   Pain Type Chronic pain   Pain Radiating Towards RT arm and hand   Pain Onset More than a month ago   Pain Frequency Intermittent   Aggravating Factors  Sleeping, using RT arm   Pain Relieving Factors rest/ medicaiton   Multiple Pain Sites No             [facility] PT Assessment - 11/11/16 0001      Assessment   Medical Diagnosis cervical disc disease   Referring Provider Mauricio Po PA   Onset Date/Surgical Date --  years ago   Next MD Visit As needed   Prior Therapy None     Precautions   Precautions None     Restrictions   Weight Bearing Restrictions No     Balance Screen   Has the patient fallen in the past 6 months No   Has the patient had a decrease in activity level because of a fear of falling?  No   Is the patient reluctant to leave their home because of a fear of falling?  No     Prior Function   Level of Independence Independent   Vocation Full time employment   Facilities manager   Overall Cognitive Status Within Functional Limits for tasks assessed     Posture/Postural Control   Posture/Postural Control No significant limitations     ROM / Strength   AROM / PROM / Strength AROM;Strength     AROM   Overall AROM Comments Shoulder ROM WFL no incr systems  AROM Assessment Site Cervical   Cervical Flexion 45   Cervical Extension 40   Cervical - Right Side Bend 23   Cervical - Left Side Bend 30   Cervical - Right Rotation 56   Cervical - Left Rotation 50     Strength   Overall Strength Within functional limits for tasks performed     Palpation   Palpation comment tender RT upper trap and                    [facility] Adult PT Treatment/Exercise - 11/11/16 0001      Modalities   Modalities Moist Heat;Traction;Ultrasound     Traction   Type of Traction Cervical   Min (lbs) 15   Max (lbs) 6   Hold Time 60   Rest Time 15   Time 15                PT Education - 11/11/16 0840    Education provided Yes   Education Details POC   sleeping with support, dry needling info   Person(s) Educated Patient   Methods Explanation   Comprehension Verbalized understanding          PT Short Term Goals - 11/11/16 0843      PT SHORT  TERM GOAL #1   Title He will be independent wit inital HEP   Time 3   Period Weeks   Status New     PT SHORT TERM GOAL #2   Title He will report cervical rotation incr to 60 degrees   Time 3   Period Weeks   Status New     PT SHORT TERM GOAL #3   Title He will improve cdervical side bending to 30 degrees bilaterally   Time 3   Period Weeks   Status New           PT Long Term Goals - 11/11/16 [id number]      PT LONG TERM GOAL #1   Title He will be independent with all HEP issued   Time 6   Period Weeks   Status New     PT LONG TERM GOAL #2   Title He will report decr RT arm symptoms by 50% or more generally   Time 6   Period Weeks   Status New     PT LONG TERM GOAL #3   Title he will report improved sleep comfort without use of medications   Time 6   Period Weeks   Status New               Plan - 11/11/16 0841    Clinical Impression Statement Mr Shane [name] presents for low complexity eval with chronic RT arm and neck /pain with numbness to fingers. his ROM in limited and is tender RT trap.    Rehab Potential Good   PT Frequency 2x / week   PT Duration 6 weeks   PT Treatment/Interventions Iontophoresis 4mg /ml Dexamethasone;Moist Heat;Traction;Ultrasound;Passive range of motion;Patient/family education;Manual techniques;Dry needling;Therapeutic exercise   PT Next Visit Plan Traction if no ill effects, modalities , manual , HEP   Consulted and Agree with Plan of Care Patient      Patient will benefit from skilled therapeutic intervention in order to improve the following deficits and impairments:  Decreased range of motion, Pain, Increased muscle spasms  Visit Diagnosis: Cervical radiculopathy due to degenerative joint disease of spine  Stiffness of cervical spine     Problem List Patient Active  Problem List   Diagnosis Date Noted  . Degenerative cervical disc 03/24/2016  . 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 Routine general medical  examination at a health care facility 09/02/2015  . Numbness and tingling in hands 08/27/2015    Darrel Hoover  PT 11/11/2016, 8:56 AM  [facility] [address] [place], [place], [address] Phone: [phone]   Fax:  [phone]  Name: Shane [name] MRN: [id number] Date of Birth: [birth year]

## 2016-11-13 ENCOUNTER — Other Ambulatory Visit: Payer: Self-pay | Admitting: *Deleted

## 2016-11-13 MED ORDER — DICLOFENAC SODIUM 2 % TD SOLN: 2.0000 "application " | Freq: Two times a day (BID) | TRANSDERMAL | 1 refills | Status: DC

## 2016-11-13 NOTE — Telephone Encounter (Signed)
Refill done.  

## 2016-11-16 ENCOUNTER — Telehealth: Payer: Self-pay | Admitting: Family

## 2016-11-16 NOTE — Telephone Encounter (Signed)
Pt called in and wanted to know why he was billed a office on the day of his CPE ?  He was billed a CPE and a 15 min office visit.  Can you help pt with this ?

## 2016-11-16 NOTE — Telephone Encounter (Signed)
Spoke with pt about his concern for the office visit charge. Informed him there was numbness in upper extremity that was addressed which was not apart of the physical. He stated that he should have been told it would be an extra charge and not apart of the physical. I told him I would talk with Marya Amsler and Almyra Free to let them know his concern

## 2016-11-17 ENCOUNTER — Ambulatory Visit: Payer: 59

## 2016-11-17 ENCOUNTER — Encounter: Payer: Self-pay | Admitting: Family

## 2016-11-17 DIAGNOSIS — M4722 Other spondylosis with radiculopathy, cervical region: Secondary | ICD-10-CM | POA: Diagnosis not present

## 2016-11-17 DIAGNOSIS — M170 Bilateral primary osteoarthritis of knee: Secondary | ICD-10-CM

## 2016-11-17 DIAGNOSIS — M436 Torticollis: Secondary | ICD-10-CM

## 2016-11-17 DIAGNOSIS — M5030 Other cervical disc degeneration, unspecified cervical region: Secondary | ICD-10-CM

## 2016-11-17 NOTE — Therapy (Signed)
[place], [place], [address] Phone: [phone]   Fax:  [phone]  Physical Therapy Treatment  Patient Details  Name: Shane [name] MRN: [id number] Date of Birth: 1950-12-26 Referring Provider: Mauricio Po PA  Encounter Date: 11/17/2016      PT End of Session - 11/17/16 0835    Visit Number 2   Number of Visits 12   Date for PT Re-Evaluation 12/18/16   PT Start Time 0802   PT Stop Time [id number]   PT Time Calculation (min) 42 min   Activity Tolerance Patient tolerated treatment well;No increased pain   Behavior During Therapy WFL for tasks assessed/performed      Past Medical History:  Diagnosis Date  . Allergy   . Arthritis     No past surgical history on file.  There were no vitals filed for this visit.      Subjective Assessment - 11/17/16 0803    Subjective About the same. But feels he was some better after treatment. Decided not to do the DN   Currently in Pain? No/denies                         [facility] Adult PT Treatment/Exercise - 11/17/16 0001      Ultrasound   Ultrasound Location RT neck    Ultrasound Parameters 100% 1MHz . 1.5 Wcm2   Ultrasound Goals Pain     Traction   Type of Traction Cervical   Min (lbs) 15   Max (lbs) 6   Hold Time 60   Rest Time 15   Time 15     Manual Therapy   Manual therapy comments STW RT neck and traps to prep for traction. Used tool to do manual                  PT Short Term Goals - 11/11/16 0843      PT SHORT TERM GOAL #1   Title He will be independent wit inital HEP   Time 3   Period Weeks   Status New     PT SHORT TERM GOAL #2   Title He will report cervical rotation incr to 60 degrees   Time 3   Period Weeks   Status New     PT SHORT TERM GOAL #3   Title He will improve cdervical side bending to 30 degrees bilaterally   Time 3   Period Weeks   Status New           PT Long Term Goals -  11/11/16 [id number]      PT LONG TERM GOAL #1   Title He will be independent with all HEP issued   Time 6   Period Weeks   Status New     PT LONG TERM GOAL #2   Title He will report decr RT arm symptoms by 50% or more generally   Time 6   Period Weeks   Status New     PT LONG TERM GOAL #3   Title he will report improved sleep comfort without use of medications   Time 6   Period Weeks   Status New               Plan - 11/17/16 [id number]    Clinical Impression Statement Mr Polan reports feels better after session.  No DN in future   PT Treatment/Interventions Iontophoresis 4mg /ml Dexamethasone;Moist Heat;Traction;Ultrasound;Passive range of motion;Patient/family education;Manual  techniques;Dry needling;Therapeutic exercise;ADLs/Self Care Home Management   PT Next Visit Plan Traction and modalities Stab exercises of needed   Consulted and Agree with Plan of Care Patient      Patient will benefit from skilled therapeutic intervention in order to improve the following deficits and impairments:  Decreased range of motion, Pain, Increased muscle spasms  Visit Diagnosis: Cervical radiculopathy due to degenerative joint disease of spine  Stiffness of cervical spine     Problem List Patient Active Problem List   Diagnosis Date Noted  . Degenerative cervical disc 03/24/2016  . 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 Routine general medical examination at a health care facility 09/02/2015  . Numbness and tingling in hands 08/27/2015    Darrel Hoover  PT 11/17/2016, 8:41 AM  [facility] [address] [place], [place], [address] Phone: [phone]   Fax:  [phone]  Name: Shane [name] MRN: [id number] Date of Birth: January 23, 1951

## 2016-11-18 MED ORDER — DICLOFENAC SODIUM 75 MG PO TBEC: 75.0000 mg | DELAYED_RELEASE_TABLET | Freq: Two times a day (BID) | ORAL | 1 refills | Status: DC

## 2016-11-20 ENCOUNTER — Ambulatory Visit: Payer: 59 | Admitting: Physical Therapy

## 2016-11-20 DIAGNOSIS — M4722 Other spondylosis with radiculopathy, cervical region: Secondary | ICD-10-CM | POA: Diagnosis not present

## 2016-11-20 DIAGNOSIS — M436 Torticollis: Secondary | ICD-10-CM

## 2016-11-20 NOTE — Therapy (Signed)
[place], [place], [address] Phone: [phone]   Fax:  [phone]  Physical Therapy Treatment  Patient Details  Name: Iancarlo Neuburger MRN: [id number] Date of Birth: Mar 14, 1951 Referring Provider: Mauricio Po PA  Encounter Date: 11/20/2016      PT End of Session - 11/20/16 0842    Visit Number 3   Number of Visits 12   Date for PT Re-Evaluation 12/18/16   PT Start Time 0800   PT Stop Time 0855   PT Time Calculation (min) 55 min   Activity Tolerance Patient tolerated treatment well   Behavior During Therapy [facility] for tasks assessed/performed      Past Medical History:  Diagnosis Date  . Allergy   . Arthritis     No past surgical history on file.  There were no vitals filed for this visit.      Subjective Assessment - 11/20/16 0803    Subjective "I think the pain is related to the stiffness, since I've been doing the treatment the tightness has gotten much better" pt reports continued numbness into the 1st, 2nd and 3rd digits.   Currently in Pain? No/denies                         [facility] Adult PT Treatment/Exercise - 11/20/16 0001      Neck Exercises: Seated   Other Seated Exercise upper cervical rotation with 4 fingers on chest, working down to 1 finger to improve fleixon/ mobility 1 x 8 ea.     Neck Exercises: Supine   Other Supine Exercise chin tucks 2 x 10 5 sec hold  also performed in sitting     Traction   Type of Traction Cervical   Min (lbs) 6   Max (lbs) 17   Hold Time 60   Rest Time 15   Time 15     Manual Therapy   Manual Therapy Neural Stretch   Manual therapy comments manual trigger point release of R scalenes, upper trap   Neural Stretch Median nerve glides     Neck Exercises: Stretches   Upper Trapezius Stretch 2 reps;30 seconds                PT Education - 11/20/16 0841    Education provided Yes   Education Details HEP with proper  form and rationale. education of involved anatomy   Person(s) Educated Patient   Methods Explanation;Verbal cues   Comprehension Verbalized understanding;Verbal cues required          PT Short Term Goals - 11/11/16 0843      PT SHORT TERM GOAL #1   Title He will be independent wit inital HEP   Time 3   Period Weeks   Status New     PT SHORT TERM GOAL #2   Title He will report cervical rotation incr to 60 degrees   Time 3   Period Weeks   Status New     PT SHORT TERM GOAL #3   Title He will improve cdervical side bending to 30 degrees bilaterally   Time 3   Period Weeks   Status New           PT Long Term Goals - 11/11/16 [id number]      PT LONG TERM GOAL #1   Title He will be independent with all HEP issued   Time 6   Period Weeks   Status New  PT LONG TERM GOAL #2   Title He will report decr RT arm symptoms by 50% or more generally   Time 6   Period Weeks   Status New     PT LONG TERM GOAL #3   Title he will report improved sleep comfort without use of medications   Time 6   Period Weeks   Status New               Plan - 11/20/16 [id number]    Clinical Impression Statement Mr. Brandell opted not to have DN done. focued on manual trigger point release and nerve glides to promote relief of tightness / N/T. update HEP for strethcing and cervical mobility, continued cervical traction increasing max weight.    PT Next Visit Plan Traction PRN (try lowering table more)  and modalities Stab exercises of needed   PT Home Exercise Plan upper trap stretching, upper cervical rotation, chin tucks   Consulted and Agree with Plan of Care Patient      Patient will benefit from skilled therapeutic intervention in order to improve the following deficits and impairments:  Decreased range of motion, Pain, Increased muscle spasms  Visit Diagnosis: Cervical radiculopathy due to degenerative joint disease of spine  Stiffness of cervical spine     Problem List Patient  Active Problem List   Diagnosis Date Noted  . Degenerative cervical disc 03/24/2016  . 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 Routine general medical examination at a health care facility 09/02/2015  . Numbness and tingling in hands 08/27/2015   Starr Lake PT, DPT, LAT, ATC  11/20/16  8:46 AM      [facility] [address] [place], [place], [address] Phone: [phone]   Fax:  [phone]  Name: Goldie Sansom MRN: [id number] Date of Birth: [birth year]

## 2016-11-24 ENCOUNTER — Ambulatory Visit: Payer: 59 | Admitting: Physical Therapy

## 2016-11-24 DIAGNOSIS — M436 Torticollis: Secondary | ICD-10-CM

## 2016-11-24 DIAGNOSIS — M4722 Other spondylosis with radiculopathy, cervical region: Secondary | ICD-10-CM

## 2016-11-24 NOTE — Therapy (Signed)
Shane [name], [place], [address] Phone: [phone]   Fax:  [phone]  Physical Therapy Treatment  Patient Details  Name: Shane [name] MRN: [id number] Date of Birth: Dec 24, 1950 Referring Provider: Mauricio Po PA  Encounter Date: 11/24/2016      PT End of Session - 11/24/16 0843    Visit Number 4   Number of Visits 12   Date for PT Re-Evaluation 12/18/16   PT Start Time 0807  pt arrived 7 minutes late   PT Stop Time 0857   PT Time Calculation (min) 50 min   Activity Tolerance Patient tolerated treatment well   Behavior During Therapy [name] for tasks assessed/performed      Past Medical History:  Diagnosis Date  . Allergy   . Arthritis     No past surgical history on file.  There were no vitals filed for this visit.      Subjective Assessment - 11/24/16 0809    Subjective "the exercises go well, when I do them. Hard to tell if the stiffness is getting better, the neck has gotten better". The tingling in the fingers hasn't gotten any better.    Currently in Pain? No/denies                         [name] Adult PT Treatment/Exercise - 11/24/16 0811      Neck Exercises: Machines for Strengthening   UBE (Upper Arm Bike) L1 x 6 min  changing direction at 3 min     Neck Exercises: Supine   Neck Retraction 10 reps;10 secs  with towel behind back of head to assist with pos.     Traction   Type of Traction Cervical  with table lowered for increased chin tuck   Min (lbs) 6   Max (lbs) 15   Hold Time 60   Rest Time 15   Time 15     Manual Therapy   Manual Therapy Joint mobilization;Other (comment)   Manual therapy comments manual trigger point releasex 2 in R scalenes   Joint Mobilization R lateral gapping C3-C7, central PA C3-C7   Other Manual Therapy sub-occipital release      Neck Exercises: Stretches   Upper Trapezius Stretch 30 seconds;1 rep   Levator Stretch 1  rep;30 seconds                PT Education - 11/24/16 0842    Education provided Yes   Education Details forward head position and the effect on the sub-occipital muscles, and side effects including tension headaches. discussed importance of doing HEP to promote relief.    Person(s) Educated Patient   Methods Explanation;Verbal cues   Comprehension Verbalized understanding;Verbal cues required          PT Short Term Goals - 11/11/16 0843      PT SHORT TERM GOAL #1   Title He will be independent wit inital HEP   Time 3   Period Weeks   Status New     PT SHORT TERM GOAL #2   Title He will report cervical rotation incr to 60 degrees   Time 3   Period Weeks   Status New     PT SHORT TERM GOAL #3   Title He will improve cdervical side bending to 30 degrees bilaterally   Time 3   Period Weeks   Status New           PT  Long Term Goals - 11/11/16 0854      PT LONG TERM GOAL #1   Title He will be independent with all HEP issued   Time 6   Period Weeks   Status New     PT LONG TERM GOAL #2   Title He will report decr RT arm symptoms by 50% or more generally   Time 6   Period Weeks   Status New     PT LONG TERM GOAL #3   Title he will report improved sleep comfort without use of medications   Time 6   Period Weeks   Status New               Plan - 11/24/16 0843    Clinical Impression Statement [name] reports doing his HEP only a little. continued stretching of the L upper trap and cervical / rib mobs. Soft tissue work perofrmed to relief tension on sub-occipitals. continued traction trialing table height to promote increased chin tuck.    PT Next Visit Plan ROM, Goals, Traction PRN (try lowering table more)  and modalities Stab exercises of needed   PT Home Exercise Plan upper trap stretching, upper cervical rotation, chin tucks   Consulted and Agree with Plan of Care Patient      Patient will benefit from skilled therapeutic intervention  in order to improve the following deficits and impairments:  Decreased range of motion, Pain, Increased muscle spasms  Visit Diagnosis: Cervical radiculopathy due to degenerative joint disease of spine  Stiffness of cervical spine     Problem List Patient Active Problem List   Diagnosis Date Noted  . Degenerative cervical disc 03/24/2016  . 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 Routine general medical examination at a health care facility 09/02/2015  . Numbness and tingling in hands 08/27/2015   Starr Lake PT, DPT, LAT, ATC  11/24/16  8:46 AM      Shane [name] [address] [place], [place], [address] Phone: [phone]   Fax:  [phone]  Name: Shane [name] MRN: [id number] Date of Birth: 1951/08/23

## 2016-11-26 ENCOUNTER — Ambulatory Visit: Payer: 59 | Admitting: Physical Therapy

## 2016-12-01 ENCOUNTER — Encounter: Payer: Self-pay | Admitting: Physical Therapy

## 2016-12-01 ENCOUNTER — Ambulatory Visit: Payer: 59 | Admitting: Physical Therapy

## 2016-12-01 DIAGNOSIS — M436 Torticollis: Secondary | ICD-10-CM

## 2016-12-01 DIAGNOSIS — M4722 Other spondylosis with radiculopathy, cervical region: Secondary | ICD-10-CM

## 2016-12-01 NOTE — Therapy (Signed)
[place], [place], [address] Phone: [phone]   Fax:  [phone]  Physical Therapy Treatment  Patient Details  Name: Shane [name] MRN: [id number] Date of Birth: 04-19-1951 Referring Provider: Mauricio Po PA  Encounter Date: 12/01/2016      PT End of Session - 12/01/16 1735    Visit Number 5   Number of Visits 12   Date for PT Re-Evaluation 12/18/16   PT Start Time 0808   PT Stop Time 0900   PT Time Calculation (min) 52 min   Activity Tolerance Patient tolerated treatment well   Behavior During Therapy [facility] for tasks assessed/performed      Past Medical History:  Diagnosis Date  . Allergy   . Arthritis     History reviewed. No pertinent surgical history.  There were no vitals filed for this visit.      Subjective Assessment - 12/01/16 0809    Subjective No changes yet. I have been trying to do .  I noticed in the last week or so that my fingers were pale and more wrinkeled.    Pain Descriptors / Indicators Sore;Tingling  hand gets pale   Pain Radiating Towards right arm    Pain Frequency Constant   Aggravating Factors  sleeping on arm,  shoveling snow,  using arm more.  Holding phone about a minute.   Pain Relieving Factors rest medication,  change of position            [facility] PT Assessment - 12/01/16 0001      AROM   Cervical - Right Rotation 60   Cervical - Left Rotation 59                     [facility] Adult PT Treatment/Exercise - 12/01/16 0001      Self-Care   Self-Care ADL's   ADL's posture reviewed     Neck Exercises: Supine   Other Supine Exercise shoulder retraction, 5 X 5 seconds   Other Supine Exercise supine scapular stabilization, green band 10 X each,  cues initially for each exercise:  narrow grip shoulder flexion, horizontal abduction,  sash right only, ER.       Traction   Type of Traction Cervical   Max (lbs) 17   Hold Time 60   Rest  Time 15   Time 15  with 3 ramps up/down                PT Education - 12/01/16 1323    Education provided Yes          PT Short Term Goals - 12/01/16 1741      PT SHORT TERM GOAL #1   Title He will be independent wit inital HEP   Time 3   Period Weeks   Status On-going     PT SHORT TERM GOAL #2   Title He will report cervical rotation incr to 60 degrees   Baseline 60 left, 59 right   Time 3   Period Weeks   Status Partially Met     PT SHORT TERM GOAL #3   Title He will improve cdervical side bending to 30 degrees bilaterally   Time 3   Period Weeks   Status Unable to assess           PT Long Term Goals - 11/11/16 8527      PT LONG TERM GOAL #1   Title He will be independent  with all HEP issued   Time 6   Period Weeks   Status New     PT LONG TERM GOAL #2   Title He will report decr RT arm symptoms by 50% or more generally   Time 6   Period Weeks   Status New     PT LONG TERM GOAL #3   Title he will report improved sleep comfort without use of medications   Time 6   Period Weeks   Status New               Plan - 12/01/16 1736    Clinical Impression Statement Mr lamar has intermittant changes in numbness in his fingers with changes in his posture.  He recently has noticed color in his hand changed to light pink.  He did not notice a temperature change.  Traction continued and posture for ADL's continued.  He continued to demonstrate forward head and rounded shoulders sitting unsupported.after sitting posture education.  AROM for rotation is improving with rotation to left 60, right 59.   Progress toward ROM goals.   PT Next Visit Plan ROM, Goals, Traction PRN (assess if lowering table more was helpful)  and modalities Stab exercises of needed   PT Home Exercise Plan upper trap stretching, upper cervical rotation, chin tucks   Consulted and Agree with Plan of Care Patient      Patient will benefit from skilled therapeutic intervention  in order to improve the following deficits and impairments:  Decreased range of motion, Pain, Increased muscle spasms  Visit Diagnosis: Cervical radiculopathy due to degenerative joint disease of spine  Stiffness of cervical spine     Problem List Patient Active Problem List   Diagnosis Date Noted  . Degenerative cervical disc 03/24/2016  . 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 Routine general medical examination at a health care facility 09/02/2015  . Numbness and tingling in hands 08/27/2015    Roni Friberg PTA 12/01/2016, 5:43 PM  [facility] [address] [place], [place], [address] Phone: [phone]   Fax:  [phone]  Name: Shane [name] MRN: [id number] Date of Birth: May 30, 1951

## 2016-12-01 NOTE — Patient Instructions (Signed)

## 2016-12-03 ENCOUNTER — Ambulatory Visit: Payer: 59 | Admitting: Physical Therapy

## 2016-12-03 DIAGNOSIS — M4722 Other spondylosis with radiculopathy, cervical region: Secondary | ICD-10-CM

## 2016-12-03 DIAGNOSIS — M436 Torticollis: Secondary | ICD-10-CM

## 2016-12-03 NOTE — Therapy (Signed)
[place], [place], [address] Phone: [phone]   Fax:  [phone]  Physical Therapy Treatment  Patient Details  Name: Shane [name] MRN: [id number] Date of Birth: 1951-09-15 Referring Provider: Mauricio Po PA  Encounter Date: 12/03/2016      PT End of Session - 12/03/16 1233    Visit Number 6   Number of Visits 12   Date for PT Re-Evaluation 12/18/16   PT Start Time 0809   PT Stop Time 4481   PT Time Calculation (min) 46 min   Activity Tolerance Patient tolerated treatment well   Behavior During Therapy [facility] for tasks assessed/performed      Past Medical History:  Diagnosis Date  . Allergy   . Arthritis     No past surgical history on file.  There were no vitals filed for this visit.      Subjective Assessment - 12/03/16 0810    Subjective "I feel fine, I always feel fine. My neck feels better, the tingling in my fingers is about the same. I liked that a little more weight was added to the traction machine this past visit. I could really feel it."   Currently in Pain? No/denies                         [facility] Adult PT Treatment/Exercise - 12/03/16 0001      Neck Exercises: Machines for Strengthening   UBE (Upper Arm Bike) L1 x 6 min  changing direction half way through     Neck Exercises: Seated   Other Seated Exercise scapular retraction 10 x 5 sec hold  with green band   Other Seated Exercise shoulder ER 2 x 15, horizontal abduction 2 x 15  with green band     Neck Exercises: Supine   Upper Extremity D2 15 reps;Theraband   Theraband Level (UE D2) Level 3 (Green)     Traction   Type of Traction Cervical   Min (lbs) 10   Max (lbs) 18   Hold Time 60   Rest Time 15   Time 15     Manual Therapy   Manual therapy comments manual trigger point release x 2 R upper trap   Other Manual Therapy sub-occipital release      Neck Exercises: Stretches   Upper  Trapezius Stretch 2 reps;30 seconds  requires cueing to hold for 30 sec                  PT Short Term Goals - 12/01/16 1741      PT SHORT TERM GOAL #1   Title He will be independent wit inital HEP   Time 3   Period Weeks   Status On-going     PT SHORT TERM GOAL #2   Title He will report cervical rotation incr to 60 degrees   Baseline 60 left, 59 right   Time 3   Period Weeks   Status Partially Met     PT SHORT TERM GOAL #3   Title He will improve cdervical side bending to 30 degrees bilaterally   Time 3   Period Weeks   Status Unable to assess           PT Long Term Goals - 11/11/16 8563      PT LONG TERM GOAL #1   Title He will be independent with all HEP issued   Time 6   Period Weeks  Status New     PT LONG TERM GOAL #2   Title He will report decr RT arm symptoms by 50% or more generally   Time 6   Period Weeks   Status New     PT LONG TERM GOAL #3   Title he will report improved sleep comfort without use of medications   Time 6   Period Weeks   Status New               Plan - 12/03/16 1236    Clinical Impression Statement Pt arrived 9 minutes late. Mr Yim reported continued improvement with neck movements, and that he felt much better after upping the weight during traction at the last visit. When using the UBE he reports increase in numbness after 4 minutes that eased as soon as he was finished and could straighten his elbow back out. He was able to perform the rest of exercises without an increase in N/T.    PT Next Visit Plan ROM, Goals, Traction PRN (assess if lowering table more was helpful)  and modalities Stab exercises as needed   PT Home Exercise Plan upper trap stretching, upper cervical rotation, chin tucks   Consulted and Agree with Plan of Care Patient      Patient will benefit from skilled therapeutic intervention in order to improve the following deficits and impairments:  Decreased range of motion, Pain, Increased  muscle spasms  Visit Diagnosis: Cervical radiculopathy due to degenerative joint disease of spine  Stiffness of cervical spine     Problem List Patient Active Problem List   Diagnosis Date Noted  . Degenerative cervical disc 03/24/2016  . 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 Routine general medical examination at a health care facility 09/02/2015  . Numbness and tingling in hands 08/27/2015    Alda Ponder, SPT 12/03/2016, 12:43 PM  [facility] [address] [place], [place], [address] Phone: [phone]   Fax:  [phone]  Name: Shane [name] MRN: [id number] Date of Birth: 1951/07/11    Starr Lake PT, DPT, LAT, ATC  12/03/16  12:48 PM

## 2016-12-08 ENCOUNTER — Ambulatory Visit: Payer: 59 | Admitting: Physical Therapy

## 2016-12-08 DIAGNOSIS — M4722 Other spondylosis with radiculopathy, cervical region: Secondary | ICD-10-CM

## 2016-12-08 DIAGNOSIS — M436 Torticollis: Secondary | ICD-10-CM

## 2016-12-08 NOTE — Therapy (Signed)
[place], [place], [address] Phone: [phone]   Fax:  [phone]  Physical Therapy Treatment  Patient Details  Name: Shane [name] MRN: [id number] Date of Birth: 1951-11-05 Referring Provider: Mauricio Po PA  Encounter Date: 12/08/2016      PT End of Session - 12/08/16 0857    Visit Number 7   Number of Visits 12   Date for PT Re-Evaluation 12/18/16   PT Start Time 0814   PT Stop Time 0900   PT Time Calculation (min) 46 min   Activity Tolerance Patient tolerated treatment well   Behavior During Therapy [facility] for tasks assessed/performed      Past Medical History:  Diagnosis Date  . Allergy   . Arthritis     No past surgical history on file.  There were no vitals filed for this visit.      Subjective Assessment - 12/08/16 0814    Subjective pt arrived 13 minutes late. "Feeling about the same. Neck still feels good, but the hand is still getting numbness and tingling."   Currently in Pain? No/denies   Pain Score 0-No pain   Pain Location Neck   Pain Descriptors / Indicators Tingling   Pain Type Chronic pain   Pain Radiating Towards right arm and hand   Pain Frequency Constant   Aggravating Factors  sleeping on it, using arm, holding phone   Pain Relieving Factors changing position, working through it                         [facility] Adult PT Treatment/Exercise - 12/08/16 0001      Neck Exercises: Machines for Strengthening   UBE (Upper Arm Bike) L1 x 5 min  changing directions halfway through     Neck Exercises: Standing   Other Standing Exercises scapular retraction 2 x 15 with blue band   Other Standing Exercises shouler ER 2 x 15 with blue band, shoulder horizontal abduction 2 x 15 with blue band     Traction   Type of Traction Cervical   Min (lbs) 10   Max (lbs) 18   Hold Time 60   Rest Time 15   Time 10     Manual Therapy   Joint Mobilization grade  3-4 PA/AP mobs at proximal radio-ulnar     Neck Exercises: Stretches   Upper Trapezius Stretch 2 reps;30 seconds                PT Education - 12/08/16 0855    Education provided Yes   Education Details education on Elbow mobilizations    Person(s) Educated Patient   Methods Explanation   Comprehension Verbalized understanding          PT Short Term Goals - 12/01/16 1741      PT SHORT TERM GOAL #1   Title He will be independent wit inital HEP   Time 3   Period Weeks   Status On-going     PT SHORT TERM GOAL #2   Title He will report cervical rotation incr to 60 degrees   Baseline 60 left, 59 right   Time 3   Period Weeks   Status Partially Met     PT SHORT TERM GOAL #3   Title He will improve cdervical side bending to 30 degrees bilaterally   Time 3   Period Weeks   Status Unable to assess  PT Long Term Goals - 11/11/16 0854      PT LONG TERM GOAL #1   Title He will be independent with all HEP issued   Time 6   Period Weeks   Status New     PT LONG TERM GOAL #2   Title He will report decr RT arm symptoms by 50% or more generally   Time 6   Period Weeks   Status New     PT LONG TERM GOAL #3   Title he will report improved sleep comfort without use of medications   Time 6   Period Weeks   Status New               Plan - 12/08/16 1054    Clinical Impression Statement pt arrived 13 minutes late due to traffic. [name] has had no neck pain in the last couple of weeks, but reports that he is still getting numbness and tingling in the hand especially when elbow is bent. Elbow mobs were performed, and reported some relief afterwards with no increase in symptoms while on the UBE. Continued cervical traction.    PT Next Visit Plan ROM, Goals, Traction PRN (assess if lowering table more was helpful)  and modalities Stab exercises as needed, proximal radioulnar mobilizations   PT Home Exercise Plan upper trap stretching, upper  cervical rotation, chin tucks   Consulted and Agree with Plan of Care Patient      Patient will benefit from skilled therapeutic intervention in order to improve the following deficits and impairments:  Decreased range of motion, Pain, Increased muscle spasms  Visit Diagnosis: Cervical radiculopathy due to degenerative joint disease of spine  Stiffness of cervical spine     Problem List Patient Active Problem List   Diagnosis Date Noted  . Degenerative cervical disc 03/24/2016  . 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 Routine general medical examination at a health care facility 09/02/2015  . Numbness and tingling in hands 08/27/2015    Alda Ponder, SPT 12/08/2016, 12:32 PM  [facility] [address] [place], [place], [address] Phone: [phone]   Fax:  [phone]  Name: Shane [name] MRN: [id number] Date of Birth: 1951/09/05

## 2016-12-10 ENCOUNTER — Ambulatory Visit: Payer: 59 | Attending: Family | Admitting: Physical Therapy

## 2016-12-10 DIAGNOSIS — M436 Torticollis: Secondary | ICD-10-CM | POA: Diagnosis present

## 2016-12-10 DIAGNOSIS — M4722 Other spondylosis with radiculopathy, cervical region: Secondary | ICD-10-CM | POA: Diagnosis not present

## 2016-12-10 NOTE — Therapy (Addendum)
[place], [place], [address] Phone: [phone]   Fax:  [phone]  Physical Therapy Treatment / Discharge Note  Patient Details  Name: Shane [name] MRN: [id number] Date of Birth: [birth year] Referring Provider: Mauricio Po PA  Encounter Date: 12/10/2016      PT End of Session - 12/10/16 1315    Visit Number 8   Number of Visits 12   Date for PT Re-Evaluation 12/18/16   PT Start Time 0809   PT Stop Time 6294   PT Time Calculation (min) 45 min   Activity Tolerance Patient tolerated treatment well   Behavior During Therapy [facility] for tasks assessed/performed      Past Medical History:  Diagnosis Date  . Allergy   . Arthritis     No past surgical history on file.  There were no vitals filed for this visit.      Subjective Assessment - 12/10/16 0810    Subjective "I felt some relief in the tingling after last session. it lasted through most of that day. It is irritated this morning because of the way I slept on it last night."   Currently in Pain? No/denies   Pain Score 0-No pain   Pain Location Neck   Pain Orientation Right   Pain Descriptors / Indicators Tingling;Numbness   Pain Type Chronic pain   Pain Radiating Towards right hand                         [facility] Adult PT Treatment/Exercise - 12/10/16 0001      Neck Exercises: Machines for Strengthening   UBE (Upper Arm Bike) L1 x 6 min  changing directions halfway through     Neck Exercises: Standing   UE D2 Weights (lbs) --  2 x 15 with red band   Other Standing Exercises scapular retraction 2 x 15 with blue band   Other Standing Exercises shouler ER 2 x 15 with blue band, shoulder horizontal abduction 2 x 15 with blue band     Traction   Type of Traction Cervical   Min (lbs) 10   Max (lbs) 18   Hold Time 60   Rest Time 15   Time 15     Manual Therapy   Joint Mobilization grade 3-4 PA/AP mobs at proximal  radio-ulnar                  PT Short Term Goals - 12/01/16 1741      PT SHORT TERM GOAL #1   Title He will be independent wit inital HEP   Time 3   Period Weeks   Status On-going     PT SHORT TERM GOAL #2   Title He will report cervical rotation incr to 60 degrees   Baseline 60 left, 59 right   Time 3   Period Weeks   Status Partially Met     PT SHORT TERM GOAL #3   Title He will improve cdervical side bending to 30 degrees bilaterally   Time 3   Period Weeks   Status Unable to assess           PT Long Term Goals - 11/11/16 7654      PT LONG TERM GOAL #1   Title He will be independent with all HEP issued   Time 6   Period Weeks   Status New     PT LONG TERM GOAL #2  Title He will report decr RT arm symptoms by 50% or more generally   Time 6   Period Weeks   Status New     PT LONG TERM GOAL #3   Title he will report improved sleep comfort without use of medications   Time 6   Period Weeks   Status New               Plan - 12/10/16 1014    Clinical Impression Statement pt arrived 8 minutes late. Mr. Groninger says following last session he had improvement in the numbness and tingling for the rest of the day. He had no increase in symptoms while on the UBE. Following traction reported tingling in his fingers had decreased.    PT Next Visit Plan ROM, Goals, Traction PRN (assess if lowering table more was helpful)  and modalities Stab exercises as needed, proximal radioulnar mobilizations   PT Home Exercise Plan upper trap stretching, upper cervical rotation, chin tucks   Consulted and Agree with Plan of Care Patient      Patient will benefit from skilled therapeutic intervention in order to improve the following deficits and impairments:  Decreased range of motion, Pain, Increased muscle spasms  Visit Diagnosis: Cervical radiculopathy due to degenerative joint disease of spine  Stiffness of cervical spine     Problem List Patient  Active Problem List   Diagnosis Date Noted  . Degenerative cervical disc 03/24/2016  . 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 Routine general medical examination at a health care facility 09/02/2015  . Numbness and tingling in hands 08/27/2015    Alda Ponder, SPT 12/10/2016, 2:47 PM  [facility] [address] [place], [place], [address] Phone: [phone]   Fax:  [phone]  Name: Shane [name] MRN: [id number] Date of Birth: 10-02-1951     PHYSICAL THERAPY DISCHARGE SUMMARY  Visits from Start of Care: 8  Current functional level related to goals / functional outcomes: See goals   Remaining deficits: unknown   Education / Equipment: HEP, posture, theraband, anatomy of condition  Plan: Patient agrees to discharge.  Patient goals were partially met. Patient is being discharged due to not returning since the last visit.  ?????     Kristoffer Leamon PT, DPT, LAT, ATC  01/12/17  1:27 PM

## 2017-01-12 ENCOUNTER — Other Ambulatory Visit: Payer: Self-pay | Admitting: Family

## 2017-01-12 DIAGNOSIS — M170 Bilateral primary osteoarthritis of knee: Secondary | ICD-10-CM

## 2017-01-12 DIAGNOSIS — M5030 Other cervical disc degeneration, unspecified cervical region: Secondary | ICD-10-CM

## 2017-01-31 ENCOUNTER — Other Ambulatory Visit: Payer: Self-pay | Admitting: Family

## 2017-03-01 ENCOUNTER — Other Ambulatory Visit: Payer: Self-pay | Admitting: Family

## 2017-03-01 DIAGNOSIS — M5030 Other cervical disc degeneration, unspecified cervical region: Secondary | ICD-10-CM

## 2017-03-10 ENCOUNTER — Other Ambulatory Visit: Payer: Self-pay | Admitting: Family

## 2017-03-10 DIAGNOSIS — M5030 Other cervical disc degeneration, unspecified cervical region: Secondary | ICD-10-CM

## 2017-03-10 DIAGNOSIS — M170 Bilateral primary osteoarthritis of knee: Secondary | ICD-10-CM

## 2017-05-08 ENCOUNTER — Other Ambulatory Visit: Payer: Self-pay | Admitting: Family

## 2017-05-08 DIAGNOSIS — M170 Bilateral primary osteoarthritis of knee: Secondary | ICD-10-CM

## 2017-05-08 DIAGNOSIS — M5030 Other cervical disc degeneration, unspecified cervical region: Secondary | ICD-10-CM

## 2017-07-03 ENCOUNTER — Other Ambulatory Visit: Payer: Self-pay | Admitting: Family

## 2017-07-03 DIAGNOSIS — M5030 Other cervical disc degeneration, unspecified cervical region: Secondary | ICD-10-CM

## 2017-07-03 DIAGNOSIS — M170 Bilateral primary osteoarthritis of knee: Secondary | ICD-10-CM

## 2017-08-20 ENCOUNTER — Other Ambulatory Visit (INDEPENDENT_AMBULATORY_CARE_PROVIDER_SITE_OTHER): Payer: 59

## 2017-08-20 ENCOUNTER — Encounter: Payer: Self-pay | Admitting: Family

## 2017-08-20 ENCOUNTER — Ambulatory Visit (INDEPENDENT_AMBULATORY_CARE_PROVIDER_SITE_OTHER): Payer: 59 | Admitting: Family

## 2017-08-20 VITALS — BP 142/68 | HR 97 | Temp 98.5°F | Resp 16 | Ht 71.0 in | Wt 208.0 lb

## 2017-08-20 DIAGNOSIS — M5030 Other cervical disc degeneration, unspecified cervical region: Secondary | ICD-10-CM | POA: Diagnosis not present

## 2017-08-20 DIAGNOSIS — Z0000 Encounter for general adult medical examination without abnormal findings: Secondary | ICD-10-CM | POA: Diagnosis not present

## 2017-08-20 DIAGNOSIS — Z125 Encounter for screening for malignant neoplasm of prostate: Secondary | ICD-10-CM

## 2017-08-20 DIAGNOSIS — Z23 Encounter for immunization: Secondary | ICD-10-CM

## 2017-08-20 LAB — COMPREHENSIVE METABOLIC PANEL
ALBUMIN: 3.8 g/dL (ref 3.5–5.2)
ALT: 37 U/L (ref 0–53)
AST: 24 U/L (ref 0–37)
Alkaline Phosphatase: 79 U/L (ref 39–117)
BILIRUBIN TOTAL: 0.7 mg/dL (ref 0.2–1.2)
BUN: 20 mg/dL (ref 6–23)
CALCIUM: 9.1 mg/dL (ref 8.4–10.5)
CHLORIDE: 106 meq/L (ref 96–112)
CO2: 28 meq/L (ref 19–32)
CREATININE: 1.01 mg/dL (ref 0.40–1.50)
GFR: 94.52 mL/min (ref >60.00)
Glucose, Bld: 103 mg/dL — ABNORMAL HIGH (ref 70–99)
Potassium: 4.5 mEq/L (ref 3.5–5.1)
Sodium: 141 mEq/L (ref 135–145)
Total Protein: 7 g/dL (ref 6.0–8.3)

## 2017-08-20 LAB — CBC
HCT: 37.5 % — ABNORMAL LOW (ref 39.0–52.0)
HEMOGLOBIN: 12.8 g/dL — AB (ref 13.0–17.0)
MCHC: 34.1 g/dL (ref 30.0–36.0)
MCV: 87.2 fl (ref 78.0–100.0)
PLATELETS: 370 10*3/uL (ref 150.0–400.0)
RBC: 4.3 Mil/uL (ref 4.22–5.81)
RDW: 13.6 % (ref 11.5–15.5)
WBC: 9.5 10*3/uL (ref 4.0–10.5)

## 2017-08-20 LAB — LIPID PANEL
CHOL/HDL RATIO: 5
CHOLESTEROL: 116 mg/dL (ref 0–200)
HDL: 25.6 mg/dL — AB (ref >39.00)
LDL Cholesterol: 76 mg/dL (ref 0–99)
NonHDL: 90.19
TRIGLYCERIDES: 73 mg/dL (ref 0.0–149.0)
VLDL: 14.6 mg/dL (ref 0.0–40.0)

## 2017-08-20 LAB — PSA: PSA: 0.77 ng/mL (ref 0.10–4.00)

## 2017-08-20 MED ORDER — GABAPENTIN 100 MG PO CAPS: 200.0000 mg | ORAL_CAPSULE | Freq: Every day | ORAL | 2 refills | Status: DC

## 2017-08-20 MED ORDER — HYDROCORTISONE 2.5 % RE CREA: 1.0000 "application " | TOPICAL_CREAM | Freq: Two times a day (BID) | RECTAL | 0 refills | Status: DC

## 2017-08-20 MED ORDER — PRAVASTATIN SODIUM 20 MG PO TABS: 20.0000 mg | ORAL_TABLET | Freq: Every day | ORAL | 2 refills | Status: DC

## 2017-08-20 MED ORDER — SILDENAFIL CITRATE 100 MG PO TABS: 50.0000 mg | ORAL_TABLET | Freq: Every day | ORAL | 4 refills | Status: DC | PRN

## 2017-08-20 NOTE — Assessment & Plan Note (Signed)
1) Anticipatory Guidance: Discussed importance of wearing a seatbelt while driving and not texting while driving; changing batteries in smoke detector at least once annually; wearing suntan lotion when outside; eating a balanced and moderate diet; getting physical activity at least 30 minutes per day.  2) Immunizations / Screenings / Labs:  Influenza updated today. All other immunizations are up-to-date per recommendations. Obtain PSA for prostate cancer screening. Colon cancer screening is up-to-date per recommendations. All other screenings are up-to-date per recommendations. Obtain CBC, CMET, and lipid profile.    Overall well exam with risk factors for cardiovascular disease including hyperlipidemia and overweight. Recommend weight loss of 5-10% of current body weight through nutrition and physical activity. Chronic conditions appear adequately managed with current medication regimen and no adverse side effects. Continue other healthy lifestyle behaviors and choices. Follow-up prevention exam in 1 year. Follow-up office visit pending blood work and as needed.

## 2017-08-20 NOTE — Progress Notes (Signed)
Subjective:    [name], male    DOB: [birth year], 66 y.o.   MRN: [id number]  Chief Complaint  Patient presents with  . CPE    fasting, refills of all meds    HPI:  Shane [name] is a 66 y.o. male who presents today for an annual wellness visit.   1) Health Maintenance -   Diet - Averages about 1-2 meals per day consisting of a regular diet; Caffeine intake of about 2-3 cups daily  Exercise - Walking on occasiona   2) Preventative Exams / Immunizations:  Dental -- Up to date  Vision -- Up to date   Health Maintenance  Topic Date Due  . INFLUENZA VACCINE  06/09/2017  . COLONOSCOPY  10/22/2026 (Originally 10/23/1999)  . TETANUS/TDAP  08/26/2025  . Hepatitis C Screening  Completed  . PNA vac Low Risk Adult  Completed    Immunization History  Administered Date(s) Administered  . Influenza, High Dose Seasonal PF 10/22/2016, 08/20/2017  . Influenza,inj,Quad PF,6+ Mos 08/27/2015  . Pneumococcal Conjugate-13 09/02/2015  . Pneumococcal Polysaccharide-23 10/23/2016  . Tdap 08/27/2015     No Known Allergies   Outpatient Medications Prior to Visit  Medication Sig Dispense Refill  . diclofenac (VOLTAREN) 75 MG EC tablet take 1 tablet by mouth twice a day 60 tablet 0  . Diclofenac Sodium (PENNSAID) 2 % SOLN Place 2 application onto the skin 2 (two) times daily. 112 g 1  . gabapentin (NEURONTIN) 100 MG capsule take 2 capsules by mouth at bedtime 60 capsule 0  . pravastatin (PRAVACHOL) 20 MG tablet take 1 tablet by mouth once daily 90 tablet 2  . sildenafil (VIAGRA) 100 MG tablet Take 0.5-1 tablets (50-100 mg total) by mouth daily as needed for erectile dysfunction. 5 tablet 4   No facility-administered medications prior to visit.      Past Medical History:  Diagnosis Date  . Allergy   . Arthritis      History reviewed. No pertinent surgical history.   Family History  Problem Relation Age of Onset  . Healthy Mother   . Hypertension Father    . Stroke Father      Social History   Social History  . Marital status: Married    Spouse name: N/A  . Number of children: 2  . Years of education: 14   Occupational History  . Service Technician    Social History Main Topics  . Smoking status: Former Smoker    Packs/day: 1.00    Years: 10.00    Types: Cigarettes    Quit date: 08/09/1978  . Smokeless tobacco: Never Used  . Alcohol use No  . Drug use: No  . Sexual activity: Not on file   Other Topics Concern  . Not on file   Social History Narrative   Fun: [name] out with his grandkids, 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      Review of Systems  Constitutional: Denies fever, chills, fatigue, or significant weight gain/loss. HENT: Head: Denies headache or neck pain Ears: Denies changes in hearing, ringing in ears, earache, drainage Nose: Denies discharge, stuffiness, itching, nosebleed, sinus pain Throat: Denies sore throat, hoarseness, dry mouth, sores, thrush Eyes: Denies loss/changes in vision, pain, redness, blurry/double vision, flashing lights Cardiovascular: Denies chest pain/discomfort, tightness, palpitations, shortness of breath with activity, difficulty lying down, swelling, sudden awakening with shortness of breath Respiratory: Denies shortness of breath, cough, sputum production, wheezing Gastrointestinal: Denies dysphasia, heartburn, change in appetite, nausea, change in  bowel habits, rectal bleeding, constipation, diarrhea, yellow skin or eyes Genitourinary: Denies frequency, urgency, burning/pain, blood in urine, incontinence, change in urinary strength. Musculoskeletal: Denies muscle/joint pain, stiffness, back pain, redness or swelling of joints, trauma Skin: Denies rashes, lumps, itching, dryness, color changes, or hair/nail changes Neurological: Denies dizziness, fainting, seizures, weakness, numbness, tingling, tremor Psychiatric - Denies nervousness, stress, depression or memory loss Endocrine: Denies heat or  cold intolerance, sweating, frequent urination, excessive thirst, changes in appetite Hematologic: Denies ease of bruising or bleeding     Objective:     BP (!) 142/68 (BP Location: Left Arm, Patient Position: Sitting, Cuff Size: Large)   Pulse 97   Temp 98.5 F (36.9 C) (Oral)   Resp 16   Ht 5\' 11"  (1.803 m)   Wt 208 lb (94.3 kg)   SpO2 96%   BMI 29.01 kg/m  Nursing note and vital signs reviewed.  Physical Exam  Constitutional: He is oriented to person, place, and time. He appears well-developed and well-nourished.  HENT:  Head: Normocephalic.  Right Ear: Hearing, tympanic membrane, external ear and ear canal normal.  Left Ear: Hearing, tympanic membrane, external ear and ear canal normal.  Nose: Nose normal.  Mouth/Throat: Uvula is midline, oropharynx is clear and moist and mucous membranes are normal.  Eyes: Pupils are equal, round, and reactive to light. Conjunctivae and EOM are normal.  Neck: Neck supple. No JVD present. No tracheal deviation present. No thyromegaly present.  Cardiovascular: Normal rate, regular rhythm, normal heart sounds and intact distal pulses.   Pulmonary/Chest: Effort normal and breath sounds normal.  Abdominal: Soft. Bowel sounds are normal. He exhibits no distension and no mass. There is no tenderness. There is no rebound and no guarding.  Musculoskeletal: Normal range of motion. He exhibits no edema or tenderness.  Lymphadenopathy:    He has no cervical adenopathy.  Neurological: He is alert and oriented to person, place, and time. He has normal reflexes. No cranial nerve deficit. He exhibits normal muscle tone. Coordination normal.  Skin: Skin is warm and dry.  Psychiatric: He has a normal mood and affect. His behavior is normal. Judgment and thought content normal.       Assessment & Plan:   Problem List Items Addressed This Visit      Musculoskeletal and Integument   Degenerative cervical disc   Relevant Medications   gabapentin  (NEURONTIN) 100 MG capsule     Other   Routine general medical examination at a health care facility - Primary (Chronic)    1) Anticipatory Guidance: Discussed importance of wearing a seatbelt while driving and not texting while driving; changing batteries in smoke detector at least once annually; wearing suntan lotion when outside; eating a balanced and moderate diet; getting physical activity at least 30 minutes per day.  2) Immunizations / Screenings / Labs:  Influenza updated today. All other immunizations are up-to-date per recommendations. Obtain PSA for prostate cancer screening. Colon cancer screening is up-to-date per recommendations. All other screenings are up-to-date per recommendations. Obtain CBC, CMET, and lipid profile.    Overall well exam with risk factors for cardiovascular disease including hyperlipidemia and overweight. Recommend weight loss of 5-10% of current body weight through nutrition and physical activity. Chronic conditions appear adequately managed with current medication regimen and no adverse side effects. Continue other healthy lifestyle behaviors and choices. Follow-up prevention exam in 1 year. Follow-up office visit pending blood work and as needed.       Relevant Orders  CBC (Completed)   Comprehensive metabolic panel (Completed)   Lipid panel (Completed)   PSA (Completed)    Other Visit Diagnoses    Need for influenza vaccination       Relevant Orders   Flu vaccine HIGH DOSE PF (Fluzone High dose) (Completed)       I have discontinued Mr. Omary's Diclofenac Sodium. I have also changed his gabapentin and pravastatin. Additionally, I am having him start on hydrocortisone. Lastly, I am having him maintain his diclofenac and sildenafil.   Meds ordered this encounter  Medications  . hydrocortisone (ANUSOL-HC) 2.5 % rectal cream    Sig: Place 1 application rectally 2 (two) times daily.    Dispense:  30 g    Refill:  0    Order Specific Question:    Supervising Provider    Answer:   Pricilla Holm A [[id number]]  . gabapentin (NEURONTIN) 100 MG capsule    Sig: Take 2 capsules (200 mg total) by mouth at bedtime.    Dispense:  60 capsule    Refill:  2    Order Specific Question:   Supervising Provider    Answer:   Pricilla Holm A [[id number]]  . pravastatin (PRAVACHOL) 20 MG tablet    Sig: Take 1 tablet (20 mg total) by mouth daily.    Dispense:  90 tablet    Refill:  2    Order Specific Question:   Supervising Provider    Answer:   Pricilla Holm A [[id number]]  . sildenafil (VIAGRA) 100 MG tablet    Sig: Take 0.5-1 tablets (50-100 mg total) by mouth daily as needed for erectile dysfunction.    Dispense:  5 tablet    Refill:  4    Order Specific Question:   Supervising Provider    Answer:   Pricilla Holm A [[id number]]     Follow-up: Return if symptoms worsen or fail to improve.   Mauricio Po, FNP

## 2017-08-20 NOTE — Patient Instructions (Signed)
Thank you for choosing [facility].  SUMMARY AND INSTRUCTIONS:  Please continue to take your medication as prescribed.   We will check your blood work today.   Medication:  Your prescription(s) have been submitted to your pharmacy or been printed and provided for you. Please take as directed and contact our office if you believe you are having problem(s) with the medication(s) or have any questions.  Labs:  Please stop by the lab on the lower level of the building for your blood work. Your results will be released to [place] (or called to you) after review, usually within 72 hours after test completion. If any changes need to be made, you will be notified at that same time.  1.) The lab is open from 7:30am to 5:30 pm Monday-Friday 2.) No appointment is necessary 3.) Fasting (if needed) is 6-8 hours after food and drink; black coffee and water are okay   Follow up:  If your symptoms worsen or fail to improve, please contact our office for further instruction, or in case of emergency go directly to the emergency room at the closest medical facility.     Health Maintenance, Male A healthy lifestyle and preventive care is important for your health and wellness. Ask your health care provider about what schedule of regular examinations is right for you. What should I know about weight and diet? Eat a Healthy Diet  Eat plenty of vegetables, fruits, whole grains, low-fat dairy products, and lean protein.  Do not eat a lot of foods high in solid fats, added sugars, or salt.  Maintain a Healthy Weight Regular exercise can help you achieve or maintain a healthy weight. You should:  Do at least 150 minutes of exercise each week. The exercise should increase your heart rate and make you sweat (moderate-intensity exercise).  Do strength-training exercises at least twice a week.  Watch Your Levels of Cholesterol and Blood Lipids  Have your blood tested for lipids and cholesterol  every 5 years starting at 67 years of age. If you are at high risk for heart disease, you should start having your blood tested when you are 68 years old. You may need to have your cholesterol levels checked more often if: ? Your lipid or cholesterol levels are high. ? You are older than 68 years of age. ? You are at high risk for heart disease.  What should I know about cancer screening? Many types of cancers can be detected early and may often be prevented. Lung Cancer  You should be screened every year for lung cancer if: ? You are a current smoker who has smoked for at least 30 years. ? You are a former smoker who has quit within the past 15 years.  Talk to your health care provider about your screening options, when you should start screening, and how often you should be screened.  Colorectal Cancer  Routine colorectal cancer screening usually begins at 68 years of age and should be repeated every 5-10 years until you are 68 years old. You may need to be screened more often if early forms of precancerous polyps or small growths are found. Your health care provider may recommend screening at an earlier age if you have risk factors for colon cancer.  Your health care provider may recommend using home test kits to check for hidden blood in the stool.  A small camera at the end of a tube can be used to examine your colon (sigmoidoscopy or colonoscopy). This checks  for the earliest forms of colorectal cancer.  Prostate and Testicular Cancer  Depending on your age and overall health, your health care provider may do certain tests to screen for prostate and testicular cancer.  Talk to your health care provider about any symptoms or concerns you have about testicular or prostate cancer.  Skin Cancer  Check your skin from head to toe regularly.  Tell your health care provider about any new moles or changes in moles, especially if: ? There is a change in a mole's size, shape, or  color. ? You have a mole that is larger than a pencil eraser.  Always use sunscreen. Apply sunscreen liberally and repeat throughout the day.  Protect yourself by wearing long sleeves, pants, a wide-brimmed hat, and sunglasses when outside.  What should I know about heart disease, diabetes, and high blood pressure?  If you are 17-19 years of age, have your blood pressure checked every 3-5 years. If you are 59 years of age or older, have your blood pressure checked every year. You should have your blood pressure measured twice-once when you are at a hospital or clinic, and once when you are not at a hospital or clinic. Record the average of the two measurements. To check your blood pressure when you are not at a hospital or clinic, you can use: ? An automated blood pressure machine at a pharmacy. ? A home blood pressure monitor.  Talk to your health care provider about your target blood pressure.  If you are between 49-3 years old, ask your health care provider if you should take aspirin to prevent heart disease.  Have regular diabetes screenings by checking your fasting blood sugar level. ? If you are at a normal weight and have a low risk for diabetes, have this test once every three years after the age of 69. ? If you are overweight and have a high risk for diabetes, consider being tested at a younger age or more often.  A one-time screening for abdominal aortic aneurysm (AAA) by ultrasound is recommended for men aged 65-75 years who are current or former smokers. What should I know about preventing infection? Hepatitis B If you have a higher risk for hepatitis B, you should be screened for this virus. Talk with your health care provider to find out if you are at risk for hepatitis B infection. Hepatitis C Blood testing is recommended for:  Everyone born from [birth year] through 1967.  Anyone with known risk factors for hepatitis C.  Sexually Transmitted Diseases (STDs)  You should be  screened each year for STDs including gonorrhea and chlamydia if: ? You are sexually active and are younger than 68 years of age. ? You are older than 68 years of age and your health care provider tells you that you are at risk for this type of infection. ? Your sexual activity has changed since you were last screened and you are at an increased risk for chlamydia or gonorrhea. Ask your health care provider if you are at risk.  Talk with your health care provider about whether you are at high risk of being infected with HIV. Your health care provider may recommend a prescription medicine to help prevent HIV infection.  What else can I do?  Schedule regular health, dental, and eye exams.  Stay current with your vaccines (immunizations).  Do not use any tobacco products, such as cigarettes, chewing tobacco, and e-cigarettes. If you need help quitting, ask your health care provider.  Limit alcohol intake to no more than 2 drinks per day. One drink equals 12 ounces of beer, 5 ounces of wine, or 1 ounces of hard liquor.  Do not use street drugs.  Do not share needles.  Ask your health care provider for help if you need support or information about quitting drugs.  Tell your health care provider if you often feel depressed.  Tell your health care provider if you have ever been abused or do not feel safe at home. This information is not intended to replace advice given to you by your health care provider. Make sure you discuss any questions you have with your health care provider. Document Released: 04/23/2008 Document Revised: 06/24/2016 Document Reviewed: 07/30/2015 Elsevier Interactive Patient Education  [employer].

## 2017-08-24 ENCOUNTER — Other Ambulatory Visit: Payer: Self-pay | Admitting: Family

## 2017-08-24 DIAGNOSIS — M170 Bilateral primary osteoarthritis of knee: Secondary | ICD-10-CM

## 2017-08-24 DIAGNOSIS — M5030 Other cervical disc degeneration, unspecified cervical region: Secondary | ICD-10-CM

## 2017-09-16 ENCOUNTER — Ambulatory Visit (INDEPENDENT_AMBULATORY_CARE_PROVIDER_SITE_OTHER): Payer: 59 | Admitting: Family Medicine

## 2017-09-16 ENCOUNTER — Encounter: Payer: Self-pay | Admitting: Family Medicine

## 2017-09-16 ENCOUNTER — Other Ambulatory Visit: Payer: 59

## 2017-09-16 VITALS — BP 150/80 | HR 72 | Ht 71.0 in | Wt 207.0 lb

## 2017-09-16 DIAGNOSIS — M170 Bilateral primary osteoarthritis of knee: Secondary | ICD-10-CM | POA: Diagnosis not present

## 2017-09-16 DIAGNOSIS — M25561 Pain in right knee: Secondary | ICD-10-CM | POA: Diagnosis not present

## 2017-09-16 DIAGNOSIS — M25562 Pain in left knee: Principal | ICD-10-CM

## 2017-09-16 MED ORDER — DICLOFENAC SODIUM 2 % TD SOLN: 2.0000 "application " | Freq: Two times a day (BID) | TRANSDERMAL | 3 refills | Status: DC

## 2017-09-16 NOTE — Patient Instructions (Signed)
Good to see you  Shane [name] is your friend. Ice 20 minutes 2 times daily. Usually after activity and before bed. pennsaid pinkie amount topically 2 times daily as needed.   Stay active.  See me again in 2-4 weeks and we will consider orthovisc.

## 2017-09-16 NOTE — Progress Notes (Signed)
[name] [address], [place] [address] Phone: [phone] Subjective:     CC: Bilateral knee pain  SNK:[id number]  Shane [name] is a 66 y.o. male coming in with complaint of bilateral knee pain.  Has been found to have degenerative joint disease.  Severe in nature.  Responded very well to Visco supplementation one year ago.  Worsening symptoms at this time.  Affecting daily activities.  Some increasing instability.      Past Medical History:  Diagnosis Date  . Allergy   . Arthritis    No past surgical history on file. Social History   Socioeconomic History  . Marital status: Married    Spouse name: None  . Number of children: 2  . Years of education: 54  . Highest education level: None  Social Needs  . Financial resource strain: None  . Food insecurity - worry: None  . Food insecurity - inability: None  . Transportation needs - medical: None  . Transportation needs - non-medical: None  Occupational History  . Occupation: Armed forces technical officer  Tobacco Use  . Smoking status: Former Smoker    Packs/day: 1.00    Years: 10.00    Pack years: 10.00    Types: Cigarettes    Last attempt to quit: 08/09/1978    Years since quitting: 39.1  . Smokeless tobacco: Never Used  Substance and Sexual Activity  . Alcohol use: No    Alcohol/week: 0.0 oz  . Drug use: No  . Sexual activity: None  Other Topics Concern  . None  Social History Narrative   Fun: [name] out with his grandkids, 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   No Known Allergies Family History  Problem Relation Age of Onset  . Healthy Mother   . Hypertension Father   . Stroke Father      Past medical history, social, surgical and family history all reviewed in electronic medical record.  No pertanent information unless stated regarding to the chief complaint.   Review of Systems:Review of systems updated and as accurate as of 09/16/17  No headache, visual changes, nausea, vomiting,  diarrhea, constipation, dizziness, abdominal pain, skin rash, fevers, chills, night sweats, weight loss, swollen lymph nodes, body aches, joint swelling, muscle aches, chest pain, shortness of breath, mood changes.   Objective  Blood pressure (!) 150/80, pulse 72, height 5\' 11"  (1.803 m), weight 207 lb (93.9 kg), SpO2 98 %. Systems examined below as of 09/16/17   General: No apparent distress alert and oriented x3 mood and affect normal, dressed appropriately.  HEENT: Pupils equal, extraocular movements intact  Respiratory: Patient's speak in full sentences and does not appear short of breath  Cardiovascular: No lower extremity edema, non tender, no erythema  Skin: Warm dry intact with no signs of infection or rash on extremities or on axial skeleton.  Abdomen: Soft nontender  Neuro: Cranial nerves II through XII are intact, neurovascularly intact in all extremities with 2+ DTRs and 2+ pulses.  Lymph: No lymphadenopathy of posterior or anterior cervical chain or axillae bilaterally.  Gait mild antalgic MSK:  Non tender with full range of motion and good stability and symmetric strength and tone of shoulders, elbows, wrist, hip, and ankles bilaterally.  Knee: Bilateral valgus deformity noted.  Abnormal thigh to calf ratio.  Tender to palpation over medial and PF joint line.  ROM full in flexion and extension and lower leg rotation. instability with valgus force.  painful patellar compression. Patellar glide with moderate  crepitus. Patellar and quadriceps tendons unremarkable. Hamstring and quadriceps strength is normal.  After informed written and verbal consent, patient was seated on exam table. Right knee was prepped with alcohol swab and utilizing anterolateral approach, patient's right knee space was injected with 4:1  marcaine 0.5%: Kenalog 40mg /dL. Patient tolerated the procedure well without immediate complications.  After informed written and verbal consent, patient was seated on  exam table. Left knee was prepped with alcohol swab and utilizing anterolateral approach, patient's left knee space was injected with 4:1  marcaine 0.5%: Kenalog 40mg /dL. Patient tolerated the procedure well without immediate complications.    Impression and Recommendations:     This case required medical decision making of moderate complexity.      Note: This dictation was prepared with Dragon dictation along with smaller phrase technology. Any transcriptional errors that result from this process are unintentional.

## 2017-09-16 NOTE — Assessment & Plan Note (Signed)
Worsening symptoms.  Has been 1 year since patient did have Visco supplementation.  Increase patient's activity slowly.  Topical anti-inflammatories prescribed again.  We discussed icing regimen.  Patient did have some mild amount of aspiration done will be sent down to the lab.  Patient could be candidate for Visco supplementation again and will come back again in 3-4 weeks for further evaluation.

## 2017-09-17 LAB — TIQ-NTM

## 2017-09-20 LAB — SYNOVIAL CELL COUNT + DIFF, W/ CRYSTALS: WBC, Synovial: 698 cells/uL — ABNORMAL HIGH (ref <150)

## 2018-01-01 ENCOUNTER — Other Ambulatory Visit: Payer: Self-pay | Admitting: Family

## 2018-01-01 DIAGNOSIS — M5030 Other cervical disc degeneration, unspecified cervical region: Secondary | ICD-10-CM

## 2018-01-01 DIAGNOSIS — M170 Bilateral primary osteoarthritis of knee: Secondary | ICD-10-CM

## 2018-01-06 ENCOUNTER — Other Ambulatory Visit: Payer: Self-pay | Admitting: Family

## 2018-01-06 DIAGNOSIS — M5030 Other cervical disc degeneration, unspecified cervical region: Secondary | ICD-10-CM

## 2018-01-06 DIAGNOSIS — M170 Bilateral primary osteoarthritis of knee: Secondary | ICD-10-CM

## 2018-02-19 NOTE — Progress Notes (Signed)
[name] [place], [place] [address] Phone: [phone] Subjective:      CC: Knee pain follow-up  [id number]  Shane [name] is a 67 y.o. male coming in with complaint of bilateral knee pain.  Found to have arthritic changes of the knees.  Patient was last seen in November and given injections of the knees bilaterally.  Patient states worsening pain recently.  States that there is some increasing instability from time to time.  Also noticing some swelling.  Not affecting daily activities yet but does not want to get so severe.       Past Medical History:  Diagnosis Date  . Allergy   . Arthritis    No past surgical history on file. Social History   Socioeconomic History  . Marital status: Married    Spouse name: Not on file  . Number of children: 2  . Years of education: 30  . Highest education level: Not on file  Occupational History  . Occupation: Armed forces technical officer  Social Needs  . Financial resource strain: Not on file  . Food insecurity:    Worry: Not on file    Inability: Not on file  . Transportation needs:    Medical: Not on file    Non-medical: Not on file  Tobacco Use  . Smoking status: Former Smoker    Packs/day: 1.00    Years: 10.00    Pack years: 10.00    Types: Cigarettes    Last attempt to quit: 08/09/1978    Years since quitting: 39.5  . Smokeless tobacco: Never Used  Substance and Sexual Activity  . Alcohol use: No    Alcohol/week: 0.0 oz  . Drug use: No  . Sexual activity: Not on file  Lifestyle  . Physical activity:    Days per week: Not on file    Minutes per session: Not on file  . Stress: Not on file  Relationships  . Social connections:    Talks on phone: Not on file    Gets together: Not on file    Attends religious service: Not on file    Active member of club or organization: Not on file    Attends meetings of clubs or organizations: Not on file    Relationship status: Not on  file  Other Topics Concern  . Not on file  Social History Narrative   Fun: [name] out with his grandkids, 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   No Known Allergies Family History  Problem Relation Age of Onset  . Healthy Mother   . Hypertension Father   . Stroke Father      Past medical history, social, surgical and family history all reviewed in electronic medical record.  No pertanent information unless stated regarding to the chief complaint.   Review of Systems:Review of systems updated and as accurate as of 02/21/18  No headache, visual changes, nausea, vomiting, diarrhea, constipation, dizziness, abdominal pain, skin rash, fevers, chills, night sweats, weight loss, swollen lymph nodes, body aches,  chest pain, shortness of breath, mood changes.  Positive muscle aches and joint swelling  Objective  Blood pressure (!) 160/80, pulse 80, height 5\' 11"  (1.803 m), weight 206 lb (93.4 kg), SpO2 98 %. Systems examined below as of 02/21/18   General: No apparent distress alert and oriented x3 mood and affect normal, dressed appropriately.  HEENT: Pupils equal, extraocular movements intact  Respiratory: Patient's speak in full sentences and does not appear short of  breath  Cardiovascular: No lower extremity edema, non tender, no erythema  Skin: Warm dry intact with no signs of infection or rash on extremities or on axial skeleton.  Abdomen: Soft nontender  Neuro: Cranial nerves II through XII are intact, neurovascularly intact in all extremities with 2+ DTRs and 2+ pulses.  Lymph: No lymphadenopathy of posterior or anterior cervical chain or axillae bilaterally.  Gait normal with good balance and coordination.  MSK:  Non tender with full range of motion and good stability and symmetric strength and tone of shoulders, elbows, wrist, hip, and ankles bilaterally.  Knee: Bilateral valgus deformity noted. Large thigh to calf ratio.  Tender to palpation over medial and PF joint line.  ROM full in flexion  and extension and lower leg rotation. instability with valgus force.  painful patellar compression. Patellar glide with moderate crepitus. Patellar and quadriceps tendons unremarkable. Hamstring and quadriceps strength is normal.   After informed written and verbal consent, patient was seated on exam table. Right knee was prepped with alcohol swab and utilizing anterolateral approach, patient's right knee space was injected with 4:1  marcaine 0.5%: Kenalog 40mg /dL. Patient tolerated the procedure well without immediate complications.  After informed written and verbal consent, patient was seated on exam table. Left knee was prepped with alcohol swab and utilizing anterolateral approach, patient's left knee space was injected with 4:1  marcaine 0.5%: Kenalog 40mg /dL. Patient tolerated the procedure well without immediate complications.    Impression and Recommendations:     This case required medical decision making of moderate complexity.      Note: This dictation was prepared with Dragon dictation along with smaller phrase technology. Any transcriptional errors that result from this process are unintentional.

## 2018-02-21 ENCOUNTER — Ambulatory Visit (INDEPENDENT_AMBULATORY_CARE_PROVIDER_SITE_OTHER): Payer: 59 | Admitting: Family Medicine

## 2018-02-21 ENCOUNTER — Encounter: Payer: Self-pay | Admitting: Family Medicine

## 2018-02-21 DIAGNOSIS — M170 Bilateral primary osteoarthritis of knee: Secondary | ICD-10-CM

## 2018-02-21 NOTE — Assessment & Plan Note (Signed)
Bilateral injections given today.  Tolerated the procedure well.  We discussed icing regimen and home exercises.  We discussed which activities to doing which wants to avoid.  Patient is to increase activity slowly.  Follow-up again in 4-6 weeks.  Visco supplementation can be beneficial if necessary.

## 2018-02-21 NOTE — Patient Instructions (Addendum)
Good to see you  Shane [name] is your friend.  pennsaid pinkie amount topically 2 times daily as needed.  Stay active.  See me again in 6-8 weeks just in case but otherwise see me when you need me

## 2018-02-23 ENCOUNTER — Ambulatory Visit (INDEPENDENT_AMBULATORY_CARE_PROVIDER_SITE_OTHER): Payer: 59 | Admitting: Family Medicine

## 2018-02-23 ENCOUNTER — Encounter: Payer: Self-pay | Admitting: Family Medicine

## 2018-02-23 VITALS — Wt 206.0 lb

## 2018-02-23 DIAGNOSIS — R269 Unspecified abnormalities of gait and mobility: Secondary | ICD-10-CM | POA: Diagnosis not present

## 2018-02-23 DIAGNOSIS — M170 Bilateral primary osteoarthritis of knee: Secondary | ICD-10-CM

## 2018-02-23 DIAGNOSIS — M5030 Other cervical disc degeneration, unspecified cervical region: Secondary | ICD-10-CM

## 2018-02-23 MED ORDER — GABAPENTIN 100 MG PO CAPS: 200.0000 mg | ORAL_CAPSULE | Freq: Every day | ORAL | 2 refills | Status: DC

## 2018-02-23 MED ORDER — DICLOFENAC SODIUM 2 % TD SOLN: 2.0000 g | Freq: Two times a day (BID) | TRANSDERMAL | 3 refills | Status: DC

## 2018-02-23 NOTE — Progress Notes (Signed)
Patient was fitted for a : standard, cushioned, semi-rigid orthotic. The orthotic was heated and afterward the patient was in a seated position and the orthotic molded. The patient was positioned in subtalar neutral position and 10 degrees of ankle dorsiflexion in a non-weight bearing stance. After completion of molding, patient did have orthotic management which included instructions on acclimating to the orthotics, signs of ill fit as well as care for the orthotic.   The blank was ground to a stable position for weight bearing. Size: 11 Base: Carbon fiber  Additional Posting and Padding:  The following postings were fitted onto the molded orthotics to help maintain a talar neutral position - Wedge posting for transverse arch:      Silicone posting for media longitudinal arch: left & right 250/60, 270/90, 300/120 The patient ambulated these, and they were very comfortable and supportive.

## 2018-02-23 NOTE — Assessment & Plan Note (Signed)
Patient will remain orthotics to help with alignment of the feet today.  Does have some abnormality of gait.  We discussed with patient about wearing them slowly over the course the next several days.  Discussed icing regimen and home exercises.  Patient will follow up with me again in 4 weeks

## 2018-08-09 NOTE — Progress Notes (Signed)
[name] [place], [place] [address] Phone: [phone] Subjective:    I Shane [name] am serving as a Education administrator for Dr. Hulan Saas.     CC: Knee pain  [id number]  Shane [name] is a 67 y.o. male coming in with complaint of knee pain. States the knee is doing well but is painful today. Wants an injection today. Wants the pill diclofenac.   Known arthritic changes of the knees.  Has been sometime since having injections.  Now with worsening pain and swelling.  Having difficulty with daily activities.     Past Medical History:  Diagnosis Date  . Allergy   . Arthritis    No past surgical history on file. Social History   Socioeconomic History  . Marital status: Married    Spouse name: Not on file  . Number of children: 2  . Years of education: 74  . Highest education level: Not on file  Occupational History  . Occupation: Armed forces technical officer  Social Needs  . Financial resource strain: Not on file  . Food insecurity:    Worry: Not on file    Inability: Not on file  . Transportation needs:    Medical: Not on file    Non-medical: Not on file  Tobacco Use  . Smoking status: Former Smoker    Packs/day: 1.00    Years: 10.00    Pack years: 10.00    Types: Cigarettes    Last attempt to quit: 08/09/1978    Years since quitting: 40.0  . Smokeless tobacco: Never Used  Substance and Sexual Activity  . Alcohol use: No    Alcohol/week: 0.0 standard drinks  . Drug use: No  . Sexual activity: Not on file  Lifestyle  . Physical activity:    Days per week: Not on file    Minutes per session: Not on file  . Stress: Not on file  Relationships  . Social connections:    Talks on phone: Not on file    Gets together: Not on file    Attends religious service: Not on file    Active member of club or organization: Not on file    Attends meetings of clubs or organizations: Not on file    Relationship status: Not on file  Other  Topics Concern  . Not on file  Social History Narrative   Fun: [name] out with his grandkids, 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   No Known Allergies Family History  Problem Relation Age of Onset  . Healthy Mother   . Hypertension Father   . Stroke Father      Current Outpatient Medications (Cardiovascular):  .  pravastatin (PRAVACHOL) 20 MG tablet, Take 1 tablet (20 mg total) by mouth daily. .  sildenafil (VIAGRA) 100 MG tablet, Take 0.5-1 tablets (50-100 mg total) by mouth daily as needed for erectile dysfunction.   Current Outpatient Medications (Analgesics):  .  diclofenac (VOLTAREN) 75 MG EC tablet, Take 1 tablet (75 mg total) by mouth 2 (two) times daily.   Current Outpatient Medications (Other):  Marland Kitchen  Diclofenac Sodium (PENNSAID) 2 % SOLN, Place 2 application 2 (two) times daily onto the skin. .  Diclofenac Sodium (PENNSAID) 2 % SOLN, Place 2 g onto the skin 2 (two) times daily. Marland Kitchen  gabapentin (NEURONTIN) 100 MG capsule, Take 2 capsules (200 mg total) by mouth at bedtime. .  hydrocortisone (ANUSOL-HC) 2.5 % rectal cream, Place 1 application rectally 2 (two) times daily.  Past medical history, social, surgical and family history all reviewed in electronic medical record.  No pertanent information unless stated regarding to the chief complaint.   Review of Systems:  No headache, visual changes, nausea, vomiting, diarrhea, constipation, dizziness, abdominal pain, skin rash, fevers, chills, night sweats, weight loss, swollen lymph nodes, body aches, joint swelling,  chest pain, shortness of breath, mood changes.  Positive muscle aches  Objective  Blood pressure (!) 150/90, pulse 76, height 5\' 11"  (1.803 m), weight 209 lb (94.8 kg), SpO2 98 %.    General: No apparent distress alert and oriented x3 mood and affect normal, dressed appropriately.  HEENT: Pupils equal, extraocular movements intact  Respiratory: Patient's speak in full sentences and does not appear short of breath    Cardiovascular: No lower extremity edema, non tender, no erythema  Skin: Warm dry intact with no signs of infection or rash on extremities or on axial skeleton.  Abdomen: Soft nontender  Neuro: Cranial nerves II through XII are intact, neurovascularly intact in all extremities with 2+ DTRs and 2+ pulses.  Lymph: No lymphadenopathy of posterior or anterior cervical chain or axillae bilaterally.  Gait mild antalgic MSK:  Non tender with full range of motion and good stability and symmetric strength and tone of shoulders, elbows, wrist, hip, and ankles bilaterally.   Knee: Bilateral valgus deformity noted.  Abnormal thigh to calf ratio.  Tender to palpation over medial and PF joint line.  ROM full in flexion and extension and lower leg rotation. instability with valgus force.  painful patellar compression. Patellar glide with moderate crepitus. Patellar and quadriceps tendons unremarkable. Hamstring and quadriceps strength is normal.  After informed written and verbal consent, patient was seated on exam table. Right knee was prepped with alcohol swab and utilizing anterolateral approach, patient's right knee space was injected with 4:1  marcaine 0.5%: Kenalog 40mg /dL. Patient tolerated the procedure well without immediate complications.  After informed written and verbal consent, patient was seated on exam table. Left knee was prepped with alcohol swab and utilizing anterolateral approach, patient's left knee space was injected with 4:1  marcaine 0.5%: Kenalog 40mg /dL. Patient tolerated the procedure well without immediate complications.    Impression and Recommendations:     This case required medical decision making of moderate complexity. The above documentation has been reviewed and is accurate and complete Lyndal Pulley, DO       Note: This dictation was prepared with Dragon dictation along with smaller phrase technology. Any transcriptional errors that result from this process  are unintentional.

## 2018-08-10 ENCOUNTER — Encounter: Payer: Self-pay | Admitting: Family Medicine

## 2018-08-10 ENCOUNTER — Ambulatory Visit (INDEPENDENT_AMBULATORY_CARE_PROVIDER_SITE_OTHER): Payer: 59 | Admitting: Family Medicine

## 2018-08-10 VITALS — BP 150/90 | HR 76 | Ht 71.0 in | Wt 209.0 lb

## 2018-08-10 DIAGNOSIS — M170 Bilateral primary osteoarthritis of knee: Secondary | ICD-10-CM | POA: Diagnosis not present

## 2018-08-10 DIAGNOSIS — M5030 Other cervical disc degeneration, unspecified cervical region: Secondary | ICD-10-CM

## 2018-08-10 MED ORDER — DICLOFENAC SODIUM 75 MG PO TBEC: 75.0000 mg | DELAYED_RELEASE_TABLET | Freq: Two times a day (BID) | ORAL | 6 refills | Status: DC

## 2018-08-10 NOTE — Patient Instructions (Signed)
Good to see you  Change the medicine  Ice is yoru friend Can repeat injections every 3 months if you need  Otherwise see me when you need me

## 2018-08-10 NOTE — Assessment & Plan Note (Signed)
Bilateral injections given today.  Tolerated the procedure well.  Discussed topical anti-inflammatories and icing regimen.  Patient given oral anti-inflammatories because he does feel they work better.  Warned of potential side effects with long-term use.  Patient will try to use more numbers like fashion.  Discussed the possibility of Visco supplementation but otherwise patient will follow-up as needed

## 2018-08-22 ENCOUNTER — Ambulatory Visit (INDEPENDENT_AMBULATORY_CARE_PROVIDER_SITE_OTHER)
Admission: RE | Admit: 2018-08-22 | Discharge: 2018-08-22 | Disposition: A | Discharge status: Home / Self Care | Payer: 59 | Source: Ambulatory Visit | Attending: Family | Admitting: Family

## 2018-08-22 ENCOUNTER — Other Ambulatory Visit (INDEPENDENT_AMBULATORY_CARE_PROVIDER_SITE_OTHER): Payer: 59

## 2018-08-22 ENCOUNTER — Encounter: Payer: Self-pay | Admitting: Family

## 2018-08-22 ENCOUNTER — Ambulatory Visit (INDEPENDENT_AMBULATORY_CARE_PROVIDER_SITE_OTHER): Payer: 59 | Admitting: Family

## 2018-08-22 VITALS — BP 150/78 | HR 79 | Temp 98.2°F | Ht 71.0 in | Wt 205.1 lb

## 2018-08-22 DIAGNOSIS — R030 Elevated blood-pressure reading, without diagnosis of hypertension: Secondary | ICD-10-CM

## 2018-08-22 DIAGNOSIS — M542 Cervicalgia: Secondary | ICD-10-CM

## 2018-08-22 DIAGNOSIS — Z125 Encounter for screening for malignant neoplasm of prostate: Secondary | ICD-10-CM

## 2018-08-22 DIAGNOSIS — Z23 Encounter for immunization: Secondary | ICD-10-CM | POA: Diagnosis not present

## 2018-08-22 DIAGNOSIS — M4802 Spinal stenosis, cervical region: Secondary | ICD-10-CM | POA: Diagnosis not present

## 2018-08-22 DIAGNOSIS — E782 Mixed hyperlipidemia: Secondary | ICD-10-CM

## 2018-08-22 DIAGNOSIS — Z0000 Encounter for general adult medical examination without abnormal findings: Secondary | ICD-10-CM

## 2018-08-22 DIAGNOSIS — R202 Paresthesia of skin: Secondary | ICD-10-CM | POA: Diagnosis not present

## 2018-08-22 DIAGNOSIS — R200 Anesthesia of skin: Secondary | ICD-10-CM

## 2018-08-22 LAB — COMPREHENSIVE METABOLIC PANEL
ALBUMIN: 4.1 g/dL (ref 3.5–5.2)
ALT: 19 U/L (ref 0–53)
AST: 15 U/L (ref 0–37)
Alkaline Phosphatase: 107 U/L (ref 39–117)
BUN: 20 mg/dL (ref 6–23)
CALCIUM: 9.6 mg/dL (ref 8.4–10.5)
CHLORIDE: 106 meq/L (ref 96–112)
CO2: 27 meq/L (ref 19–32)
Creatinine, Ser: 1.07 mg/dL (ref 0.40–1.50)
GFR: 88.17 mL/min (ref >60.00)
Glucose, Bld: 99 mg/dL (ref 70–99)
POTASSIUM: 4.1 meq/L (ref 3.5–5.1)
Sodium: 138 mEq/L (ref 135–145)
Total Bilirubin: 0.7 mg/dL (ref 0.2–1.2)
Total Protein: 6.8 g/dL (ref 6.0–8.3)

## 2018-08-22 LAB — CBC WITH DIFFERENTIAL/PLATELET
BASOS PCT: 3 % (ref 0.0–3.0)
Basophils Absolute: 0.2 10*3/uL — ABNORMAL HIGH (ref 0.0–0.1)
Eosinophils Absolute: 0.1 10*3/uL (ref 0.0–0.7)
Eosinophils Relative: 1.6 % (ref 0.0–5.0)
HEMATOCRIT: 39.1 % (ref 39.0–52.0)
HEMOGLOBIN: 13.3 g/dL (ref 13.0–17.0)
LYMPHS PCT: 22.5 % (ref 12.0–46.0)
Lymphs Abs: 1.2 10*3/uL (ref 0.7–4.0)
MCHC: 34 g/dL (ref 30.0–36.0)
MCV: 85.1 fl (ref 78.0–100.0)
MONOS PCT: 9.2 % (ref 3.0–12.0)
Monocytes Absolute: 0.5 10*3/uL (ref 0.1–1.0)
NEUTROS ABS: 3.4 10*3/uL (ref 1.4–7.7)
Neutrophils Relative %: 63.7 % (ref 43.0–77.0)
Platelets: 287 10*3/uL (ref 150.0–400.0)
RBC: 4.59 Mil/uL (ref 4.22–5.81)
RDW: 13.6 % (ref 11.5–15.5)
WBC: 5.4 10*3/uL (ref 4.0–10.5)

## 2018-08-22 LAB — LIPID PANEL
CHOL/HDL RATIO: 3
CHOLESTEROL: 120 mg/dL (ref 0–200)
HDL: 38.2 mg/dL — AB (ref >39.00)
LDL CALC: 72 mg/dL (ref 0–99)
NonHDL: 81.49
TRIGLYCERIDES: 49 mg/dL (ref 0.0–149.0)
VLDL: 9.8 mg/dL (ref 0.0–40.0)

## 2018-08-22 LAB — PSA: PSA: 0.29 ng/mL (ref 0.10–4.00)

## 2018-08-22 LAB — VITAMIN B12: Vitamin B-12: 431 pg/mL (ref 211–911)

## 2018-08-22 LAB — TSH: TSH: 1.92 u[IU]/mL (ref 0.35–4.50)

## 2018-08-22 LAB — MAGNESIUM: Magnesium: 2.2 mg/dL (ref 1.5–2.5)

## 2018-08-22 MED ORDER — PRAVASTATIN SODIUM 20 MG PO TABS: 20.0000 mg | ORAL_TABLET | Freq: Every day | ORAL | 3 refills | Status: DC

## 2018-08-22 NOTE — Progress Notes (Signed)
Shane [name] is a 67 y.o. male with the following history as recorded in [employer]:  Patient Active Problem List   Diagnosis Date Noted  . Abnormality of gait 02/23/2018  . Degenerative cervical disc 03/24/2016  . 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 Routine general medical examination at a health care facility 09/02/2015  . Numbness and tingling in hands 08/27/2015    Current Outpatient Medications  Medication Sig Dispense Refill  . diclofenac (VOLTAREN) 75 MG EC tablet Take 1 tablet (75 mg total) by mouth 2 (two) times daily. 60 tablet 6  . Diclofenac Sodium (PENNSAID) 2 % SOLN Place 2 g onto the skin 2 (two) times daily. 112 g 3  . gabapentin (NEURONTIN) 100 MG capsule Take 2 capsules (200 mg total) by mouth at bedtime. 180 capsule 2  . hydrocortisone (ANUSOL-HC) 2.5 % rectal cream Place 1 application rectally 2 (two) times daily. 30 g 0  . pravastatin (PRAVACHOL) 20 MG tablet Take 1 tablet (20 mg total) by mouth daily. 90 tablet 3   No current facility-administered medications for this visit.     Allergies: Patient has no known allergies.  Past Medical History:  Diagnosis Date  . Allergy   . Arthritis     History reviewed. No pertinent surgical history.  Family History  Problem Relation Age of Onset  . Healthy Mother   . Hypertension Father   . Stroke Father     Social History   Tobacco Use  . Smoking status: Former Smoker    Packs/day: 1.00    Years: 10.00    Pack years: 10.00    Types: Cigarettes    Last attempt to quit: 08/09/1978    Years since quitting: 40.0  . Smokeless tobacco: Never Used  Substance Use Topics  . Alcohol use: No    Alcohol/week: 0.0 standard drinks    Subjective:  Patient presents for his yearly CPE; in baseline state of health with no concerns; is working with Dr. Tamala Julian in sports medicine; notes that his blood pressure is averaging 138-150/ 80; denies any concerns for white coat hypertension; Up to date with eye doctor  and dentist;  Colonoscopy- defers at this time; PSA- will screen today; Continued symptoms of numbness/ tingling into right hand- last X-ray done in 2016- showed arthritis changes; tried PT with limited benefit.  Review of Systems  Constitutional: Negative for malaise/fatigue and weight loss.  HENT: Negative for hearing loss.   Eyes: Negative for blurred vision and pain.  Respiratory: Negative for cough and shortness of breath.   Cardiovascular: Negative for chest pain and palpitations.  Gastrointestinal: Negative for abdominal pain, constipation and diarrhea.  Genitourinary: Negative.   Musculoskeletal: Positive for joint pain.       Knee pain- Dr. Tamala Julian  Skin: Negative for rash.  Neurological: Negative for dizziness and headaches.  Endo/Heme/Allergies: Negative.   Psychiatric/Behavioral: Negative for depression and memory loss. The patient is not nervous/anxious and does not have insomnia.      Objective:  Vitals:   08/22/18 0829  BP: (!) 150/78  Pulse: 79  Temp: 98.2 F (36.8 C)  TempSrc: Oral  SpO2: 97%  Weight: 205 lb 1.9 oz (93 kg)  Height: '5\' 11"'$  (1.803 m)    General: Well developed, well nourished, in no acute distress  Skin : Warm and dry.  Head: Normocephalic and atraumatic  Eyes: Sclera and conjunctiva clear; pupils round and reactive to light; extraocular movements intact  Ears: External normal; canals clear; tympanic membranes  normal  Oropharynx: Pink, supple. No suspicious lesions  Neck: Supple without thyromegaly, adenopathy  Lungs: Respirations unlabored; clear to auscultation bilaterally without wheeze, rales, rhonchi  CVS exam: normal rate and regular rhythm.  Abdomen: Soft; nontender; nondistended; normoactive bowel sounds; no masses or hepatosplenomegaly  Musculoskeletal: No deformities; no active joint inflammation  Extremities: No edema, cyanosis, clubbing  Vessels: Symmetric bilaterally  Neurologic: Alert and oriented; speech intact; face  symmetrical; moves all extremities well; CNII-XII intact without focal deficit   Assessment:  1. PE (physical exam), annual   2. Numbness and tingling   3. Neck pain   4. Mixed hyperlipidemia   5. Prostate cancer screening   6. Elevated blood pressure reading     Plan:  Age appropriate preventive healthcare needs addressed; encouraged regular eye doctor and dental exams; encouraged regular exercise; will update labs and refills as needed today; follow-up to be determined; Patient defers colon cancer screening at this time- information given regarding Cologuard; he will consider; Flu and Shingrix updated; needs Shingrix #2 in 2 months; Update cervical X-ray, B12- may need MRI, referral for injections to be considered; Discussed elevated blood pressure- will most likely need medication; DASH diet discussed- copy provided; patient to check pressure daily x 7-10 days and forward results for review; follow-up to be determined.    No follow-ups on file.  Orders Placed This Encounter  Procedures  . DG Cervical Spine 2 or 3 views    Standing Status:   Future    Standing Expiration Date:   10/23/2019    Order Specific Question:   Reason for Exam (SYMPTOM  OR DIAGNOSIS REQUIRED)    Answer:   neck pain/ numbness and tingling into right fingertip    Order Specific Question:   Preferred imaging location?    Answer:   [facility]    Order Specific Question:   Radiology Contrast Protocol - do NOT remove file path    Answer:   [email or web address]  . B12    Standing Status:   Future    Number of Occurrences:   1    Standing Expiration Date:   08/22/2019  . CBC w/Diff    Standing Status:   Future    Number of Occurrences:   1    Standing Expiration Date:   08/22/2019  . Comp Met (CMET)    Standing Status:   Future    Number of Occurrences:   1    Standing Expiration Date:   08/22/2019  . Lipid panel    Standing Status:   Future    Number of  Occurrences:   1    Standing Expiration Date:   08/23/2019  . TSH    Standing Status:   Future    Number of Occurrences:   1    Standing Expiration Date:   08/22/2019  . PSA    Standing Status:   Future    Number of Occurrences:   1    Standing Expiration Date:   08/22/2019  . Magnesium    Standing Status:   Future    Number of Occurrences:   1    Standing Expiration Date:   08/22/2019    Requested Prescriptions   Signed Prescriptions Disp Refills  . pravastatin (PRAVACHOL) 20 MG tablet 90 tablet 3    Sig: Take 1 tablet (20 mg total) by mouth daily.

## 2018-08-22 NOTE — Patient Instructions (Signed)
Please start checking your blood pressure daily x 7-10 days and forward for review;  DASH Eating Plan DASH stands for "Dietary Approaches to Stop Hypertension." The DASH eating plan is a healthy eating plan that has been shown to reduce high blood pressure (hypertension). It may also reduce your risk for type 2 diabetes, heart disease, and stroke. The DASH eating plan may also help with weight loss. What are tips for following this plan? General guidelines  Avoid eating more than 2,300 mg (milligrams) of salt (sodium) a day. If you have hypertension, you may need to reduce your sodium intake to 1,500 mg a day.  Limit alcohol intake to no more than 1 drink a day for nonpregnant women and 2 drinks a day for men. One drink equals 12 oz of beer, 5 oz of wine, or 1 oz of hard liquor.  Work with your health care provider to maintain a healthy body weight or to lose weight. Ask what an ideal weight is for you.  Get at least 30 minutes of exercise that causes your heart to beat faster (aerobic exercise) most days of the week. Activities may include walking, swimming, or biking.  Work with your health care provider or diet and nutrition specialist (dietitian) to adjust your eating plan to your individual calorie needs. Reading food labels  Check food labels for the amount of sodium per serving. Choose foods with less than 5 percent of the Daily Value of sodium. Generally, foods with less than 300 mg of sodium per serving fit into this eating plan.  To find whole grains, look for the word "whole" as the first word in the ingredient list. Shopping  Buy products labeled as "low-sodium" or "no salt added."  Buy fresh foods. Avoid canned foods and premade or frozen meals. Cooking  Avoid adding salt when cooking. Use salt-free seasonings or herbs instead of table salt or sea salt. Check with your health care provider or pharmacist before using salt substitutes.  Do not fry foods. Cook foods using  healthy methods such as baking, boiling, grilling, and broiling instead.  Cook with heart-healthy oils, such as olive, canola, soybean, or sunflower oil. Meal planning   Eat a balanced diet that includes: ? 5 or more servings of fruits and vegetables each day. At each meal, try to fill half of your plate with fruits and vegetables. ? Up to 6-8 servings of whole grains each day. ? Less than 6 oz of lean meat, poultry, or fish each day. A 3-oz serving of meat is about the same size as a deck of cards. One egg equals 1 oz. ? 2 servings of low-fat dairy each day. ? A serving of nuts, seeds, or beans 5 times each week. ? Heart-healthy fats. Healthy fats called Omega-3 fatty acids are found in foods such as flaxseeds and coldwater fish, like sardines, salmon, and mackerel.  Limit how much you eat of the following: ? Canned or prepackaged foods. ? Food that is high in trans fat, such as fried foods. ? Food that is high in saturated fat, such as fatty meat. ? Sweets, desserts, sugary drinks, and other foods with added sugar. ? Full-fat dairy products.  Do not salt foods before eating.  Try to eat at least 2 vegetarian meals each week.  Eat more home-cooked food and less restaurant, buffet, and fast food.  When eating at a restaurant, ask that your food be prepared with less salt or no salt, if possible. What foods are  recommended? The items listed may not be a complete list. Talk with your dietitian about what dietary choices are best for you. Grains Whole-grain or whole-wheat bread. Whole-grain or whole-wheat pasta. Brown rice. [place]. Bulgur. Whole-grain and low-sodium cereals. Pita bread. Low-fat, low-sodium crackers. Whole-wheat flour tortillas. Vegetables Fresh or frozen vegetables (raw, steamed, roasted, or grilled). Low-sodium or reduced-sodium tomato and vegetable juice. Low-sodium or reduced-sodium tomato sauce and tomato paste. Low-sodium or reduced-sodium canned  vegetables. Fruits All fresh, dried, or frozen fruit. Canned fruit in natural juice (without added sugar). Meat and other protein foods Skinless chicken or [place]. Ground chicken or [place]. Pork with fat trimmed off. Fish and seafood. Egg whites. Dried beans, peas, or lentils. Unsalted nuts, nut butters, and seeds. Unsalted canned beans. Lean cuts of beef with fat trimmed off. Low-sodium, lean deli meat. Dairy Low-fat (1%) or fat-free (skim) milk. Fat-free, low-fat, or reduced-fat cheeses. Nonfat, low-sodium ricotta or cottage cheese. Low-fat or nonfat yogurt. Low-fat, low-sodium cheese. Fats and oils Soft margarine without trans fats. Vegetable oil. Low-fat, reduced-fat, or light mayonnaise and salad dressings (reduced-sodium). Canola, safflower, olive, soybean, and sunflower oils. Avocado. Seasoning and other foods Herbs. Spices. Seasoning mixes without salt. Unsalted popcorn and pretzels. Fat-free sweets. What foods are not recommended? The items listed may not be a complete list. Talk with your dietitian about what dietary choices are best for you. Grains Baked goods made with fat, such as croissants, muffins, or some breads. Dry pasta or rice meal packs. Vegetables Creamed or fried vegetables. Vegetables in a cheese sauce. Regular canned vegetables (not low-sodium or reduced-sodium). Regular canned tomato sauce and paste (not low-sodium or reduced-sodium). Regular tomato and vegetable juice (not low-sodium or reduced-sodium). Angie Fava. Olives. Fruits Canned fruit in a light or heavy syrup. Fried fruit. Fruit in cream or butter sauce. Meat and other protein foods Fatty cuts of meat. Ribs. Fried meat. Berniece Salines. Sausage. Bologna and other processed lunch meats. Salami. Fatback. Hotdogs. Bratwurst. Salted nuts and seeds. Canned beans with added salt. Canned or smoked fish. Whole eggs or egg yolks. Chicken or [place] with skin. Dairy Whole or 2% milk, cream, and half-and-half. Whole or full-fat  cream cheese. Whole-fat or sweetened yogurt. Full-fat cheese. Nondairy creamers. Whipped toppings. Processed cheese and cheese spreads. Fats and oils Butter. Stick margarine. Lard. Shortening. Ghee. Bacon fat. Tropical oils, such as coconut, palm kernel, or palm oil. Seasoning and other foods Salted popcorn and pretzels. Onion salt, garlic salt, seasoned salt, table salt, and sea salt. Worcestershire sauce. Tartar sauce. Barbecue sauce. Teriyaki sauce. Soy sauce, including reduced-sodium. Steak sauce. Canned and packaged gravies. Fish sauce. Oyster sauce. Cocktail sauce. Horseradish that you find on the shelf. Ketchup. Mustard. Meat flavorings and tenderizers. Bouillon cubes. Hot sauce and Tabasco sauce. Premade or packaged marinades. Premade or packaged taco seasonings. Relishes. Regular salad dressings. Where to find more information:  National Heart, Lung, and [place]: https://wilson-eaton.com/  American Heart Association: www.heart.org Summary  The DASH eating plan is a healthy eating plan that has been shown to reduce high blood pressure (hypertension). It may also reduce your risk for type 2 diabetes, heart disease, and stroke.  With the DASH eating plan, you should limit salt (sodium) intake to 2,300 mg a day. If you have hypertension, you may need to reduce your sodium intake to 1,500 mg a day.  When on the DASH eating plan, aim to eat more fresh fruits and vegetables, whole grains, lean proteins, low-fat dairy, and heart-healthy fats.  Work with your health  care provider or diet and nutrition specialist (dietitian) to adjust your eating plan to your individual calorie needs. This information is not intended to replace advice given to you by your health care provider. Make sure you discuss any questions you have with your health care provider. Document Released: 10/15/2011 Document Revised: 10/19/2016 Document Reviewed: 10/19/2016 Elsevier Interactive Patient Education  2018 [place] Maintenance, Male A healthy lifestyle and preventive care is important for your health and wellness. Ask your health care provider about what schedule of regular examinations is right for you. What should I know about weight and diet? Eat a Healthy Diet  Eat plenty of vegetables, fruits, whole grains, low-fat dairy products, and lean protein.  Do not eat a lot of foods high in solid fats, added sugars, or salt.  Maintain a Healthy Weight Regular exercise can help you achieve or maintain a healthy weight. You should:  Do at least 150 minutes of exercise each week. The exercise should increase your heart rate and make you sweat (moderate-intensity exercise).  Do strength-training exercises at least twice a week.  Watch Your Levels of Cholesterol and Blood Lipids  Have your blood tested for lipids and cholesterol every 5 years starting at 69 years of age. If you are at high risk for heart disease, you should start having your blood tested when you are 69 years old. You may need to have your cholesterol levels checked more often if: ? Your lipid or cholesterol levels are high. ? You are older than 69 years of age. ? You are at high risk for heart disease.  What should I know about cancer screening? Many types of cancers can be detected early and may often be prevented. Lung Cancer  You should be screened every year for lung cancer if: ? You are a current smoker who has smoked for at least 30 years. ? You are a former smoker who has quit within the past 15 years.  Talk to your health care provider about your screening options, when you should start screening, and how often you should be screened.  Colorectal Cancer  Routine colorectal cancer screening usually begins at 69 years of age and should be repeated every 5-10 years until you are 69 years old. You may need to be screened more often if early forms of precancerous polyps or small growths are found. Your  health care provider may recommend screening at an earlier age if you have risk factors for colon cancer.  Your health care provider may recommend using home test kits to check for hidden blood in the stool.  A small camera at the end of a tube can be used to examine your colon (sigmoidoscopy or colonoscopy). This checks for the earliest forms of colorectal cancer.  Prostate and Testicular Cancer  Depending on your age and overall health, your health care provider may do certain tests to screen for prostate and testicular cancer.  Talk to your health care provider about any symptoms or concerns you have about testicular or prostate cancer.  Skin Cancer  Check your skin from head to toe regularly.  Tell your health care provider about any new moles or changes in moles, especially if: ? There is a change in a mole's size, shape, or color. ? You have a mole that is larger than a pencil eraser.  Always use sunscreen. Apply sunscreen liberally and repeat throughout the day.  Protect yourself by wearing long sleeves,  pants, a wide-brimmed hat, and sunglasses when outside.  What should I know about heart disease, diabetes, and high blood pressure?  If you are 26-33 years of age, have your blood pressure checked every 3-5 years. If you are 37 years of age or older, have your blood pressure checked every year. You should have your blood pressure measured twice-once when you are at a hospital or clinic, and once when you are not at a hospital or clinic. Record the average of the two measurements. To check your blood pressure when you are not at a hospital or clinic, you can use: ? An automated blood pressure machine at a pharmacy. ? A home blood pressure monitor.  Talk to your health care provider about your target blood pressure.  If you are between 64-42 years old, ask your health care provider if you should take aspirin to prevent heart disease.  Have regular diabetes screenings by  checking your fasting blood sugar level. ? If you are at a normal weight and have a low risk for diabetes, have this test once every three years after the age of 71. ? If you are overweight and have a high risk for diabetes, consider being tested at a younger age or more often.  A one-time screening for abdominal aortic aneurysm (AAA) by ultrasound is recommended for men aged 76-75 years who are current or former smokers. What should I know about preventing infection? Hepatitis B If you have a higher risk for hepatitis B, you should be screened for this virus. Talk with your health care provider to find out if you are at risk for hepatitis B infection. Hepatitis C Blood testing is recommended for:  Everyone born from [birth year] through 1967.  Anyone with known risk factors for hepatitis C.  Sexually Transmitted Diseases (STDs)  You should be screened each year for STDs including gonorrhea and chlamydia if: ? You are sexually active and are younger than 69 years of age. ? You are older than 69 years of age and your health care provider tells you that you are at risk for this type of infection. ? Your sexual activity has changed since you were last screened and you are at an increased risk for chlamydia or gonorrhea. Ask your health care provider if you are at risk.  Talk with your health care provider about whether you are at high risk of being infected with HIV. Your health care provider may recommend a prescription medicine to help prevent HIV infection.  What else can I do?  Schedule regular health, dental, and eye exams.  Stay current with your vaccines (immunizations).  Do not use any tobacco products, such as cigarettes, chewing tobacco, and e-cigarettes. If you need help quitting, ask your health care provider.  Limit alcohol intake to no more than 2 drinks per day. One drink equals 12 ounces of beer, 5 ounces of wine, or 1 ounces of hard liquor.  Do not use street drugs.  Do not  share needles.  Ask your health care provider for help if you need support or information about quitting drugs.  Tell your health care provider if you often feel depressed.  Tell your health care provider if you have ever been abused or do not feel safe at home. This information is not intended to replace advice given to you by your health care provider. Make sure you discuss any questions you have with your health care provider. Document Released: 04/23/2008 Document Revised: 06/24/2016 Document Reviewed: 07/30/2015 Elsevier  Interactive Patient Education  [employer].

## 2018-08-22 NOTE — Addendum Note (Signed)
Addended by: Marcina Millard on: 08/22/2018 11:30 AM   Modules accepted: Orders

## 2018-09-05 ENCOUNTER — Ambulatory Visit (INDEPENDENT_AMBULATORY_CARE_PROVIDER_SITE_OTHER): Payer: 59 | Admitting: Sports Medicine

## 2018-09-05 ENCOUNTER — Telehealth: Payer: Self-pay | Admitting: *Deleted

## 2018-09-05 ENCOUNTER — Encounter: Payer: Self-pay | Admitting: Sports Medicine

## 2018-09-05 VITALS — BP 128/74 | HR 82 | Ht 71.0 in | Wt 205.4 lb

## 2018-09-05 DIAGNOSIS — M4722 Other spondylosis with radiculopathy, cervical region: Secondary | ICD-10-CM

## 2018-09-05 DIAGNOSIS — M542 Cervicalgia: Secondary | ICD-10-CM

## 2018-09-05 DIAGNOSIS — M5030 Other cervical disc degeneration, unspecified cervical region: Secondary | ICD-10-CM | POA: Diagnosis not present

## 2018-09-05 MED ORDER — GABAPENTIN 300 MG PO CAPS: 300.0000 mg | ORAL_CAPSULE | Freq: Two times a day (BID) | ORAL | 2 refills | Status: DC

## 2018-09-05 MED ORDER — METHYLPREDNISOLONE ACETATE 80 MG/ML IJ SUSP
80.0000 mg | Freq: Once | INTRAMUSCULAR | Status: AC
  Administered 2018-09-05: 80 mg via INTRAMUSCULAR

## 2018-09-05 MED ORDER — KETOROLAC TROMETHAMINE 60 MG/2ML IM SOLN
60.0000 mg | Freq: Once | INTRAMUSCULAR | Status: AC
  Administered 2018-09-05: 60 mg via INTRAMUSCULAR

## 2018-09-05 NOTE — Telephone Encounter (Signed)
Copied from [place] [id number]. Topic: General - Other >> Sep 05, 2018 10:42 AM Bea Graff, NT wrote: Reason for CRM: Pt requesting a call from Dr. Thompson Caul nurse regarding his neck pain. Appt made with Dr. Paulla Fore at [facility] for this afternoon.

## 2018-09-05 NOTE — Progress Notes (Signed)
Shane [name]. [place], [name]  Shane [name] - 67 y.o. male MRN [id number]  Date of birth: [birth year]  Visit Date: 09/05/2018  PCP: Marrian Salvage, FNP   Referred by: Marrian Salvage,*   Scribe(s) for today's visit: Wendy Poet, LAT, ATC  SUBJECTIVE:  Shane [name] is here for Pain of the Neck (Previously seen by Dr. Tamala Julian) and Follow-up (Neck pain)  HPI: 09/05/2018: Compared to the last office visit on 02/23/18, his previously described neck symptoms are worsening.  He states that he began having insidious onset neck pain on Saturday, Oct. 26, 2019.  He reports that the pain began in the R side of his neck and has moved to between his scapulae and along his post neck.  He reports that all cervical motions are painful and limited. Current symptoms are severe & are nonradiating He has been taking Aleve and reports some limited improvement.  He has also tried ice and heat w/ no relief.  C-spine XR - 08/22/18  REVIEW OF SYSTEMS: Reports night time disturbances. Denies fevers, chills, or night sweats. Denies unexplained weight loss. Denies personal history of cancer. Denies changes in bowel or bladder habits. Denies recent unreported falls. Denies new or worsening dyspnea or wheezing. Denies headaches or dizziness.  Reports numbness, tingling or weakness  In the extremities - in B UEs Denies dizziness or presyncopal episodes Denies lower extremity edema    HISTORY:  Prior history reviewed and updated per electronic medical record.  Social History   Occupational History  . Occupation: Armed forces technical officer  Tobacco Use  . Smoking status: Former Smoker    Packs/day: 1.00    Years: 10.00    Pack years: 10.00    Types: Cigarettes    Last attempt to quit: 08/09/1978    Years since quitting: 40.3  . Smokeless tobacco: Never Used  Substance and Sexual Activity  . Alcohol use: No   Alcohol/week: 0.0 standard drinks  . Drug use: No  . Sexual activity: Not on file   Social History   Social History Narrative   Fun: [name] out with his grandkids, 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   Past Medical History:  Diagnosis Date  . Allergy   . Arthritis    History reviewed. No pertinent surgical history. family history includes Healthy in his mother; Hypertension in his father; Stroke in his father.  DATA OBTAINED & REVIEWED:   Recent Labs    08/22/18 0919  CALCIUM 9.6  AST 15  ALT 19  TSH 1.92   No problems updated. 08/22/18 - DG Cervical Spine: Multilevel degenerative changes most notably at C5-6, C6-7.   OBJECTIVE:  VS:  HT:5\' 11"  (180.3 cm)   WT:205 lb 6.4 oz (93.2 kg)  BMI:28.66    BP:128/74  HR:82bpm  TEMP: ( )  RESP:96 %   PHYSICAL EXAM: CONSTITUTIONAL: Well-developed, Well-nourished and In no acute distress EYES: Pupils are equal., EOM intact without nystagmus. and No scleral icterus. Psychiatric: Alert & appropriately interactive. and Not depressed or anxious appearing. EXTREMITY EXAM: Warm and well perfused  SHOULDER Findings: No stiffness, no weakness, no crepitus noted  Neck:   Well aligned, no significant torticollis  Midline Bony TTP: none   Paraspinal Muscle Spasm: Yes, bilateral  CERVICAL ROM: limited right rotation, left rotation, right lateral flexion and left lateral flexion  NEURAL TENSION SIGNS Right Left  Brachial Plexus Squeeze: positive, mild pain positive, mild pain  Arm Squeeze Test: positive,  mild pain positive, mild pain  Spurling's Compression Test: normal, no pain normal, no pain    MOTOR TESTING: Intact in all UE myotomes    ASSESSMENT   1. Neck pain   2. Degenerative cervical disc   3. Osteoarthritis of spine with radiculopathy, cervical region     PROCEDURES:  None  PLAN:  Pertinent additional documentation may be included in corresponding procedure notes, imaging studies, problem based documentation and  patient instructions.  No problem-specific Assessment & Plan notes found for this encounter.  Symptoms are consistent with cervical spondylosis and radiculitis.  Given the acuity we will go ahead and administer medicines as below.  Close follow-up.  Activity modifications and the importance of avoiding exacerbating activities (limiting pain to no more than a 4 / 10 during or following activity) recommended and discussed.  Discussed red flag symptoms that warrant earlier emergent evaluation and patient voices understanding.  Meds ordered this encounter  Medications  . gabapentin (NEURONTIN) 300 MG capsule    Sig: Take 1 capsule (300 mg total) by mouth 2 (two) times daily.    Dispense:  60 capsule    Refill:  2  . methylPREDNISolone acetate (DEPO-MEDROL) injection 80 mg  . ketorolac (TORADOL) injection 60 mg   Lab Orders  No laboratory test(s) ordered today   Imaging Orders  No imaging studies ordered today   Referral Orders  No referral(s) requested today    At follow up will plan: initial osteopathic manipulation and if any worsening features obtain MRI cervical spine Return in about 2 weeks (around 09/19/2018).          Gerda Diss, [facility] Physician

## 2018-09-05 NOTE — Telephone Encounter (Unsigned)
Copied from [place] [id number]. Topic: General - Other >> Sep 05, 2018 10:42 AM Bea Graff, NT wrote: Reason for CRM: Pt requesting a call from Dr. Thompson Caul nurse regarding his neck pain. Appt made with Dr. Paulla Fore at [facility] for this afternoon.

## 2018-09-19 ENCOUNTER — Ambulatory Visit: Payer: Self-pay

## 2018-09-19 ENCOUNTER — Encounter: Payer: Self-pay | Admitting: Sports Medicine

## 2018-09-19 ENCOUNTER — Ambulatory Visit (INDEPENDENT_AMBULATORY_CARE_PROVIDER_SITE_OTHER): Payer: 59 | Admitting: Sports Medicine

## 2018-09-19 ENCOUNTER — Ambulatory Visit: Payer: 59 | Admitting: Sports Medicine

## 2018-09-19 VITALS — BP 130/82 | HR 72 | Ht 71.0 in | Wt 205.8 lb

## 2018-09-19 DIAGNOSIS — M542 Cervicalgia: Secondary | ICD-10-CM | POA: Diagnosis not present

## 2018-09-19 DIAGNOSIS — M25531 Pain in right wrist: Secondary | ICD-10-CM | POA: Diagnosis not present

## 2018-09-19 DIAGNOSIS — M4722 Other spondylosis with radiculopathy, cervical region: Secondary | ICD-10-CM

## 2018-09-19 DIAGNOSIS — M5030 Other cervical disc degeneration, unspecified cervical region: Secondary | ICD-10-CM | POA: Diagnosis not present

## 2018-09-19 DIAGNOSIS — G5601 Carpal tunnel syndrome, right upper limb: Secondary | ICD-10-CM

## 2018-09-19 NOTE — Procedures (Signed)
PROCEDURE NOTE:  Ultrasound Guided: Injection: Right carpal tunnel hydrodissection Images were obtained and interpreted by myself, Teresa Coombs, DO  Images have been saved and stored to PACS system. Images obtained on: GE S7 Ultrasound machine    ULTRASOUND FINDINGS:  0.16cm2 median nerve at carpal tunnel  DESCRIPTION OF PROCEDURE:  The patient's clinical condition is marked by substantial pain and/or significant functional disability. Other conservative therapy has not provided relief, is contraindicated, or not appropriate. There is a reasonable likelihood that injection will significantly improve the patient's pain and/or functional impairment.   After discussing the risks, benefits and expected outcomes of the injection and all questions were reviewed and answered, the patient wished to undergo the above named procedure.  Verbal consent was obtained.  The ultrasound was used to identify the target structure and adjacent neurovascular structures. The skin was then prepped in sterile fashion and the target structure was injected under direct visualization using sterile technique as below:  Single injection performed as below: PREP: Alcohol and Ethel Chloride APPROACH:direct, single injection, 25g 1.5 in. INJECTATE: 0.5 cc 1% lidocaine, 0.5 cc 0.5% Marcaine and 0.5 cc 40mg /mL DepoMedrol ASPIRATE: None DRESSING: Band-Aid  Post procedural instructions including recommending icing and warning signs for infection were reviewed.    This procedure was well tolerated and there were no complications.   IMPRESSION: Succesful Ultrasound Guided: Injection

## 2018-09-19 NOTE — Patient Instructions (Signed)

## 2018-09-19 NOTE — Assessment & Plan Note (Signed)
Moderate based on ultrasound today.  Ultrasound-guided hydrodissection performed. High likelihood of double crush phenomenon

## 2018-09-19 NOTE — Progress Notes (Signed)
Shane [name]. Shane [name], [place] at [facility] [phone]  Shane [name] - 67 y.o. male MRN [id number]  Date of birth: 05-31-1951  Visit Date: 09/19/2018  PCP: Shane Salvage, FNP   Referred by: Shane [name],*   Scribe(s) for today's visit: Josepha Pigg, CMA  SUBJECTIVE:  Shane [name] is here for Follow-up (neck pain)   HPI: 09/05/2018:  Compared to the last office visit on 02/23/18, his previously described neck symptoms are worsening.  He states that he began having insidious onset neck pain on Saturday, Oct. 26, 2019.  He reports that the pain began in the R side of his neck and has moved to between his scapulae and along his post neck.  He reports that all cervical motions are painful and limited. Current symptoms are severe & are nonradiating He has been taking Aleve and reports some limited improvement.  He has also tried ice and heat w/ no relief. C-spine XR - 08/22/18  09/19/2018: Compared to the last office visit, his previously described symptoms are improving. He has continued stiffness when looking to the R or L. He has mild pain when tilting head back.  Current symptoms are mild & are nonradiating He has been taking Gabapentin 300 mg qAM (prescribed BID). He has d/c Aleve, heat, and ice.  He received IM DepoMedrol 80 mg and Ketorolac 60 mg and responded well.   Patient does report more focal right hand symptoms in the median nerve distribution.  This is been present for quite some time.  The gabapentin provides only minimal improvements in the symptoms and does not be worse with fine motor activities.  REVIEW OF SYSTEMS: Denies night time disturbances. Denies fevers, chills, or night sweats. Denies unexplained weight loss. Denies personal history of cancer. Denies changes in bowel or bladder habits. Denies recent unreported falls. Denies new or worsening dyspnea or wheezing. Denies headaches or  dizziness.  Reports numbness, tingling or weakness in the extremities - in B UEs Denies dizziness or presyncopal episodes Denies lower extremity edema    HISTORY:  Prior history reviewed and updated per electronic medical record.  Social History   Occupational History  . Occupation: Armed forces technical officer  Tobacco Use  . Smoking status: Former Smoker    Packs/day: 1.00    Years: 10.00    Pack years: 10.00    Types: Cigarettes    Last attempt to quit: 08/09/1978    Years since quitting: 40.1  . Smokeless tobacco: Never Used  Substance and Sexual Activity  . Alcohol use: No    Alcohol/week: 0.0 standard drinks  . Drug use: No  . Sexual activity: Not on file   Social History   Social History Narrative   Fun: [name] out with his grandkids, 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     [place]:  No results for input(s): HGBA1C, LABURIC, CREATINE in the last 8760 hours. . 08/22/2018: X-ray cervical spine showed multilevel degenerative changes  OBJECTIVE:  VS:  HT:5\' 11"  (180.3 cm)   WT:205 lb 12.8 oz (93.4 kg)  BMI:28.72    BP:130/82  HR:72bpm  TEMP: ( )  RESP:98 %   PHYSICAL EXAM: CONSTITUTIONAL: Well-developed, Well-nourished and In no acute distress PSYCHIATRIC: Alert & appropriately interactive. and Not depressed or anxious appearing. RESPIRATORY: No increased work of breathing and Trachea Midline EYES: Pupils are equal., EOM intact without nystagmus. and No scleral icterus.  VASCULAR EXAM: Warm and well perfused NEURO: unremarkable Normal associated myotomal  distribution strength to manual muscle testing Normal sensation to light touch Normal and symmetric associated DTRs  MSK Exam: Right hand  Well aligned, no significant deformity. No overlying skin changes. For the carpal tunnel with carpal tunnel compression test.   RANGE OF MOTION & STRENGTH  Somewhat limited wrist extension strength is intact   SPECIALITY TESTING:  Positive Phalen's test and mildly  positive Tinel's at the wrist.  Normal Spurling's compression test and brachial plexus squeeze today  Negative Lhermitte's compression test     ASSESSMENT   1. Right wrist pain   2. Neck pain   3. Osteoarthritis of spine with radiculopathy, cervical region   4. Degenerative cervical disc   5. Right carpal tunnel syndrome     PLAN:  Pertinent additional documentation may be included in corresponding procedure notes, imaging studies, problem based documentation and patient instructions.  Procedures:  . US Guided Injection per procedure note  Medications:  No orders of the defined types were placed in this encounter.  Discussion/Instructions: Right carpal tunnel syndrome Moderate based on ultrasound today.  Ultrasound-guided hydrodissection performed. High likelihood of double crush phenomenon  . Can consider addition of night splints if persistent symptoms . Discussed red flag symptoms that warrant earlier emergent evaluation and patient voices understanding. . Activity modifications and the importance of avoiding exacerbating activities (limiting pain to no more than a 4 / 10 during or following activity) recommended and discussed.  Follow-up:  . Return in about 6 weeks (around 10/31/2018).   . If any lack of improvement consider: consider further diagnostic evaluation with Nerve conduction studies     CMA/ATC served as scribe during this visit. History, Physical, and Plan performed by medical provider. Documentation and orders reviewed and attested to.      Gerda Diss, [place] [facility] Physician

## 2018-10-31 ENCOUNTER — Ambulatory Visit (INDEPENDENT_AMBULATORY_CARE_PROVIDER_SITE_OTHER): Payer: 59 | Admitting: Sports Medicine

## 2018-10-31 ENCOUNTER — Encounter: Payer: Self-pay | Admitting: Sports Medicine

## 2018-10-31 VITALS — BP 150/84 | HR 80 | Ht 71.0 in | Wt 209.8 lb

## 2018-10-31 DIAGNOSIS — M542 Cervicalgia: Secondary | ICD-10-CM

## 2018-10-31 DIAGNOSIS — M9902 Segmental and somatic dysfunction of thoracic region: Secondary | ICD-10-CM

## 2018-10-31 DIAGNOSIS — M9901 Segmental and somatic dysfunction of cervical region: Secondary | ICD-10-CM | POA: Diagnosis not present

## 2018-10-31 DIAGNOSIS — M9908 Segmental and somatic dysfunction of rib cage: Secondary | ICD-10-CM

## 2018-10-31 DIAGNOSIS — M4722 Other spondylosis with radiculopathy, cervical region: Secondary | ICD-10-CM

## 2018-10-31 NOTE — Patient Instructions (Addendum)
Also check out "Foundation Training" which is a program developed by Dr. Eric Goodman.   There are links to a couple of his YouTube Videos below and I would like to see you performing one of his videos 5-6 days per week.  It is best to do these exercises first thing in the morning.  They will give you a good jumpstart here today and start normalizing the way you move.  A good intro video is: "Independence from Pain 7-minute Video" - https://www.youtube.com/watch?v=V179hqrkFJ0   A more advanced video is: "Foundation training original 12 minutes" - https://www.youtube.com/watch?v=4BOTvaRaDjI  Exercises that focus more on the neck are as below: Dr. Goodman with [employer] teaching neck and shoulder details Part 1 - https://youtu.be/cTk8PpDogq0 Part 2 Dr. Goodman with [employer] quick routine to practice daily - https://youtu.be/Y63sa6ETT6s  Do not try to attempt the entire video when first beginning.  Try breaking of each exercise that he goes into shorter segments.  In other words, if they perform an exercise for 45 seconds, start with 15 seconds and rest and then resume when they begin the new activity.  If you work your way up to being able to do these videos without having to stop, I expect you will see significant improvements in your pain.  If you enjoy his videos and would like to find out more you can look on his website: [email or web address].  He has a workout streaming option as well as a DVD set available for purchase.  Amazon has the best price for his DVDs.    

## 2018-10-31 NOTE — Progress Notes (Signed)
Shane [name]. Shane [name], [place] at [place]  Shane [name] - 67 y.o. male MRN [id number]  Date of birth: 1951-11-02  Visit Date: 10/31/2018   PCP: Marrian Salvage, FNP   Referred by: Marrian Salvage,*   SUBJECTIVE:  Chief Complaint  Patient presents with  . f/u neck pain    Sx have improved, not much pain at all.   . f/u R wrist pain    Sx are improving. He has noticed more tingling in the R hand this morning.     HPI: Patient is here for follow-up of neck and wrist pain.  Overall symptoms have significantly improved.  He is continuing with Pennsaid and gabapentin occasionally.  He is undergone a right wrist carpal tunnel Hydro dissection and this provided significant improvement.  He is he used to have some tingling first thing in the morning but this is minimal.  No significant interference in his day-to-day activities at this time.  REVIEW OF SYSTEMS: Still some occasional tingling in the right hand.  HISTORY:  Prior history reviewed and updated per electronic medical record.  Social History   Occupational History  . Occupation: Armed forces technical officer  Tobacco Use  . Smoking status: Former Smoker    Packs/day: 1.00    Years: 10.00    Pack years: 10.00    Types: Cigarettes    Last attempt to quit: 08/09/1978    Years since quitting: 40.3  . Smokeless tobacco: Never Used  Substance and Sexual Activity  . Alcohol use: No    Alcohol/week: 0.0 standard drinks  . Drug use: No  . Sexual activity: Not on file   Social History   Social History Narrative   Fun: [name] out with his grandkids, 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     [place]:  Recent Labs    08/22/18 0919  CALCIUM 9.6  AST 15  ALT 19  TSH 1.92   No problems updated. 08/22/18 - DG Cervical Spine: Multilevel degenerative changes most notably at C5-6, C6-7.  OBJECTIVE:  VS:  HT:5\' 11"  (180.3 cm)   WT:209 lb 12.8 oz (95.2 kg)   BMI:29.27    BP:(!) 150/84  HR:80bpm  TEMP: ( )  RESP:98 %   PHYSICAL EXAM: Adult male.  No acute distress.  Alert and appropriate. Bilateral upper extremities overall well aligned.  He has good overhead range of motion of the shoulders.  Functional limitations in his cervical spine range of motion but well-preserved in general.  Negative Spurling's compression test and Lhermitte's compression test. He has a small amount of pain with carpal tunnel compression test but this is minimal and does not cause any radiation.  Negative Tinel's and Phalen's.   ASSESSMENT  1. Neck pain   2. Osteoarthritis of spine with radiculopathy, cervical region   3. Somatic dysfunction of cervical region   4. Somatic dysfunction of thoracic region   5. Somatic dysfunction of rib cage region     PLAN:  Pertinent additional documentation may be included in corresponding procedure notes, imaging studies, problem based documentation and patient instructions.  Procedures:  Osteopathic manipulation was performed today based on physical exam findings.  Please see procedure note for further information including Osteopathic Exam findings  Medications:  No orders of the defined types were placed in this encounter.   Discussion/Instructions: No problem-specific Assessment & Plan notes found for this encounter.  Links to [employer] provided today per Patient Instructions.  These exercises were developed by Minerva Ends, DC with a strong emphasis on core neuromuscular reducation and postural realignment through body-weight exercises. Discussed the underlying features of tight hip flexors leading to crouched, fetal like position that results in spinal column compression.  Including lumbar hyperflexion with hypermobility, thoracic flexion with restrictive rotation and cervical lordosis reversal RICE (Rest, ICE, Compression, Elevation) principles reviewed with the patient. Discussed red flag symptoms  that warrant earlier emergent evaluation and patient voices understanding. Activity modifications and the importance of avoiding exacerbating activities (limiting pain to no more than a 4 / 10 during or following activity) recommended and discussed. Overall he is doing significantly better.  Only very brief flareup over the past day of his right median nerve distribution dysesthesia.  Suspect from worsening tightness in his neck and spine likely leading to some double crush phenomenon.  We will plan to have him begin working on [employer]" with Dr. Minerva Ends and follow-up as needed if any persistent symptoms.  He will call us if he is having persistent median nerve symptoms so we can send him for a nerve conduction study.  Otherwise we will plan to have him follow-up as needed. If any lack of improvement: consider referral to Neurology/physiatry for nerve conduction study/EMG of the right upper extremity.  At follow up will plan : to consider repeat osteopathic manipulation Return if symptoms worsen or fail to improve.          Gerda Diss, [place] [facility] Physician

## 2018-10-31 NOTE — Progress Notes (Signed)
PROCEDURE NOTE : OSTEOPATHIC MANIPULATION The decision today to treat with Osteopathic Manipulative Therapy (OMT) was based on physical exam findings. Verbal consent was obtained following a discussion with the patient regarding the of risks, benefits and potential side effects, including an acute pain flare,post manipulation soreness and need for repeat treatments.     Contraindications to OMT: NONE  Manipulation was performed as below: Regions Treated & Osteopathic Exam Findings  C3 FRS right (Flexed, Rotated & Sidebent) T6 ERS left (Extended, Rotated & Sidebent) Rib 4Left  Posterior   OMT Techniques Used  HVLA muscle energy myofascial release    The patient tolerated the treatment well and reported Improved symptoms following treatment today. Patient was given medications, exercises, stretches and lifestyle modifications per AVS and verbally.

## 2018-11-26 ENCOUNTER — Encounter: Payer: Self-pay | Admitting: Sports Medicine

## 2018-12-03 ENCOUNTER — Encounter: Payer: Self-pay | Admitting: Sports Medicine

## 2018-12-17 ENCOUNTER — Encounter: Payer: Self-pay | Admitting: Sports Medicine

## 2019-06-07 ENCOUNTER — Other Ambulatory Visit: Payer: Self-pay | Admitting: Sports Medicine

## 2019-06-07 NOTE — Telephone Encounter (Signed)
Called and LM for pt concerning refill for gabapentin and whether he does or does not need this refill.  Pt is a prior pt of Dr. Tamala Julian so could redirect to Dr Tamala Julian if needed.

## 2019-06-08 NOTE — Telephone Encounter (Signed)
Spoke to pt and he confirmed that he does need a refill of his Gabapentin 300mg .  Last office visit w/ Paulla Fore was on 10/31/18 and last refill was on 09/05/18.  May need virtual visit to refill but unsure what your criteria are regarding refills.

## 2019-06-09 ENCOUNTER — Other Ambulatory Visit: Payer: Self-pay

## 2019-06-09 MED ORDER — GABAPENTIN 300 MG PO CAPS: 300.0000 mg | ORAL_CAPSULE | Freq: Two times a day (BID) | ORAL | 0 refills | Status: DC

## 2019-06-09 NOTE — Telephone Encounter (Signed)
Please advise regarding refill request.  Shane [name] pt who is also a prior Shane [name] pt.

## 2019-06-13 ENCOUNTER — Other Ambulatory Visit: Payer: Self-pay | Admitting: Physical Therapy

## 2019-06-13 DIAGNOSIS — M170 Bilateral primary osteoarthritis of knee: Secondary | ICD-10-CM

## 2019-06-13 MED ORDER — DICLOFENAC SODIUM 75 MG PO TBEC: 75.0000 mg | DELAYED_RELEASE_TABLET | Freq: Two times a day (BID) | ORAL | 0 refills | Status: DC

## 2019-06-30 ENCOUNTER — Other Ambulatory Visit: Payer: Self-pay

## 2019-06-30 ENCOUNTER — Encounter: Payer: Self-pay | Admitting: Family Medicine

## 2019-06-30 ENCOUNTER — Ambulatory Visit: Payer: 59 | Admitting: Family Medicine

## 2019-06-30 DIAGNOSIS — M170 Bilateral primary osteoarthritis of knee: Secondary | ICD-10-CM

## 2019-06-30 MED ORDER — DICLOFENAC SODIUM 2 % TD SOLN: 2.0000 g | Freq: Two times a day (BID) | TRANSDERMAL | 3 refills | Status: DC

## 2019-06-30 NOTE — Progress Notes (Signed)
[name] [address] [place], [place] [address] Phone: [phone] Subjective:   Shane [name], am serving as a scribe for Dr. Hulan Saas.    CC: Bilateral knee pain  [id number]   08/2018 Bilateral injections given today.  Tolerated the procedure well.  Discussed topical anti-inflammatories and icing regimen.  Patient given oral anti-inflammatories because he does feel they work better.  Warned of potential side effects with long-term use.  Patient will try to use more numbers like fashion.  Discussed the possibility of Visco supplementation but otherwise patient will follow-up as needed  Update 06/30/2019 Shane [name] is a 68 y.o. male coming in with complaint of bilateral knee pain. Pain increased around time covid started. Would like injections and refill on voltaren.  Patient states that the pain is starting to affect daily activities.  Keeping him up at night.  Rates the severity of pain is 8 out of 10.      Past Medical History:  Diagnosis Date  . Allergy   . Arthritis    [name] past surgical history on file. Social History   Socioeconomic History  . Marital status: Married    Spouse name: Not on file  . Number of children: 2  . Years of education: 79  . Highest education level: Not on file  Occupational History  . Occupation: Armed forces technical officer  Social Needs  . Financial resource strain: Not on file  . Food insecurity    Worry: Not on file    Inability: Not on file  . Transportation needs    Medical: Not on file    Non-medical: Not on file  Tobacco Use  . Smoking status: Former Smoker    Packs/day: 1.00    Years: 10.00    Pack years: 10.00    Types: Cigarettes    Quit date: 08/09/1978    Years since quitting: 40.9  . Smokeless tobacco: Never Used  Substance and Sexual Activity  . Alcohol use: [name]    Alcohol/week: 0.0 standard drinks  . Drug use: [name]  . Sexual activity: Not on file  Lifestyle  . Physical activity   Days per week: Not on file    Minutes per session: Not on file  . Stress: Not on file  Relationships  . Social Herbalist on phone: Not on file    Gets together: Not on file    Attends religious service: Not on file    Active member of club or organization: Not on file    Attends meetings of clubs or organizations: Not on file    Relationship status: Not on file  Other Topics Concern  . Not on file  Social History Narrative   Fun: [name] out with his grandkids, 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   [name] Known Allergies Family History  Problem Relation Age of Onset  . Healthy Mother   . Hypertension Father   . Stroke Father      Current Outpatient Medications (Cardiovascular):  .  pravastatin (PRAVACHOL) 20 MG tablet, Take 1 tablet (20 mg total) by mouth daily.   Current Outpatient Medications (Analgesics):  .  diclofenac (VOLTAREN) 75 MG EC tablet, Take 1 tablet (75 mg total) by mouth 2 (two) times daily.   Current Outpatient Medications (Other):  Marland Kitchen  Diclofenac Sodium (PENNSAID) 2 % SOLN, Place 2 g onto the skin 2 (two) times daily. Marland Kitchen  gabapentin (NEURONTIN) 300 MG capsule, Take 1 capsule (300 mg total) by  mouth 2 (two) times daily. .  hydrocortisone (ANUSOL-HC) 2.5 % rectal cream, Place 1 application rectally 2 (two) times daily. .  Diclofenac Sodium 2 % SOLN, Place 2 g onto the skin 2 (two) times daily.    Past medical history, social, surgical and family history all reviewed in electronic medical record.  [name] pertanent information unless stated regarding to the chief complaint.   Review of Systems:  [name] headache, visual changes, nausea, vomiting, diarrhea, constipation, dizziness, abdominal pain, skin rash, fevers, chills, night sweats, weight loss, swollen lymph nodes, body aches, joint swelling, muscle aches, chest pain, shortness of breath, mood changes.   Objective  Blood pressure (!) 142/82, pulse 76, height 5\' 11"  (1.803 m), weight 203 lb (92.1 kg), SpO2 98 %. Systems  examined below as of    General: [name] apparent distress alert and oriented x3 mood and affect normal, dressed appropriately.  HEENT: Pupils equal, extraocular movements intact  Respiratory: Patient's speak in full sentences and does not appear short of breath  Cardiovascular: [name] lower extremity edema, non tender, [name] erythema  Skin: Warm dry intact with [name] signs of infection or rash on extremities or on axial skeleton.  Abdomen: Soft nontender  Neuro: Cranial nerves II through XII are intact, neurovascularly intact in all extremities with 2+ DTRs and 2+ pulses.  Lymph: [name] lymphadenopathy of posterior or anterior cervical chain or axillae bilaterally.  Gait normal with good balance and coordination.  MSK:  Non tender with full range of motion and good stability and symmetric strength and tone of shoulders, elbows, wrist, hip, knee and ankles bilaterally.  Mild arthritic changes of multiple joints Knee: Bilateral valgus deformity noted. Large thigh to calf ratio.  Trace effusion noted in the knees bilaterally Tender to palpation over medial and PF joint line.  ROM full in flexion and extension and lower leg rotation. instability with valgus force.  painful patellar compression. Patellar glide with moderate crepitus. Patellar and quadriceps tendons unremarkable. Hamstring and quadriceps strength is normal.   After informed written and verbal consent, patient was seated on exam table. Right knee was prepped with alcohol swab and utilizing anterolateral approach, patient's right knee space was injected with 4:1  marcaine 0.5%: Kenalog 40mg /dL. Patient tolerated the procedure well without immediate complications.  After informed written and verbal consent, patient was seated on exam table. Left knee was prepped with alcohol swab and utilizing anterolateral approach, patient's left knee space was injected with 4:1  marcaine 0.5%: Kenalog 40mg /dL. Patient tolerated the procedure well without immediate  complications.   Impression and Recommendations:     This case required medical decision making of moderate complexity. The above documentation has been reviewed and is accurate and complete Shane Pulley, DO       Note: This dictation was prepared with Dragon dictation along with smaller phrase technology. Any transcriptional errors that result from this process are unintentional.

## 2019-06-30 NOTE — Assessment & Plan Note (Signed)
Repeat injection given today.  Tolerated the procedures well.  Discussed icing regimen and home exercises.  Can be a candidate for Visco supplementation again.  We will get approval.  Follow-up again in 4 to 6 weeks

## 2019-06-30 NOTE — Patient Instructions (Signed)
See me again in 4-6 weeks  

## 2019-07-10 ENCOUNTER — Other Ambulatory Visit: Payer: Self-pay

## 2019-07-10 DIAGNOSIS — M170 Bilateral primary osteoarthritis of knee: Secondary | ICD-10-CM

## 2019-07-10 MED ORDER — GABAPENTIN 300 MG PO CAPS: 300.0000 mg | ORAL_CAPSULE | Freq: Two times a day (BID) | ORAL | 0 refills | Status: DC

## 2019-07-10 MED ORDER — DICLOFENAC SODIUM 75 MG PO TBEC: 75.0000 mg | DELAYED_RELEASE_TABLET | Freq: Two times a day (BID) | ORAL | 0 refills | Status: DC

## 2019-08-04 ENCOUNTER — Other Ambulatory Visit: Payer: Self-pay

## 2019-08-04 ENCOUNTER — Encounter: Payer: Self-pay | Admitting: Family Medicine

## 2019-08-04 ENCOUNTER — Ambulatory Visit: Payer: 59 | Admitting: Family Medicine

## 2019-08-04 DIAGNOSIS — M170 Bilateral primary osteoarthritis of knee: Secondary | ICD-10-CM | POA: Diagnosis not present

## 2019-08-04 NOTE — Progress Notes (Signed)
[name] [place], [name] [address] Phone: [phone] Subjective:   Shane Shane [name], am serving as a scribe for Dr. Hulan Saas.   CC: Knee pain bilateral  [id number]   06/30/2019 Repeat injection given today.  Tolerated the procedures well.  Discussed icing regimen and home exercises.  Can be a candidate for Visco supplementation again.  We will get approval.  Follow-up again in 4 to 6 weeks   Shane Shane [name] is a 68 y.o. male coming in with complaint of bilateral knee pain.  Patient has severe arthritic changes.  Did respond fairly well to a steroid injection previously but would like to consider Visco supplementation.  Has responded well to it in the past.  Wants to avoid any surgical intervention.     Past Medical History:  Diagnosis Date  . Allergy   . Arthritis    Shane [name] past surgical history on file. Social History   Socioeconomic History  . Marital status: Married    Spouse name: Not on file  . Number of children: 2  . Years of education: 22  . Highest education level: Not on file  Occupational History  . Occupation: Armed forces technical officer  Social Needs  . Financial resource strain: Not on file  . Food insecurity    Worry: Not on file    Inability: Not on file  . Transportation needs    Medical: Not on file    Non-medical: Not on file  Tobacco Use  . Smoking status: Former Smoker    Packs/day: 1.00    Years: 10.00    Pack years: 10.00    Types: Cigarettes    Quit date: 08/09/1978    Years since quitting: 41.0  . Smokeless tobacco: Never Used  Substance and Sexual Activity  . Alcohol use: Shane [name]    Alcohol/week: 0.0 standard drinks  . Drug use: Shane [name]  . Sexual activity: Not on file  Lifestyle  . Physical activity    Days per week: Not on file    Minutes per session: Not on file  . Stress: Not on file  Relationships  . Social Herbalist on phone: Not on file    Gets together: Not on file    Attends  religious service: Not on file    Active member of club or organization: Not on file    Attends meetings of clubs or organizations: Not on file    Relationship status: Not on file  Other Topics Concern  . Not on file  Social History Narrative   Fun: Shane Shane [name] out with his grandkids, 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   Shane [name] Known Allergies Family History  Problem Relation Age of Onset  . Healthy Mother   . Hypertension Father   . Stroke Father      Current Outpatient Medications (Cardiovascular):  .  pravastatin (PRAVACHOL) 20 MG tablet, Take 1 tablet (20 mg total) by mouth daily.   Current Outpatient Medications (Analgesics):  .  diclofenac (VOLTAREN) 75 MG EC tablet, Take 1 tablet (75 mg total) by mouth 2 (two) times daily.   Current Outpatient Medications (Other):  Marland Kitchen  Diclofenac Sodium (PENNSAID) 2 % SOLN, Place 2 g onto the skin 2 (two) times daily. .  Diclofenac Sodium 2 % SOLN, Place 2 g onto the skin 2 (two) times daily. Marland Kitchen  gabapentin (NEURONTIN) 300 MG capsule, Take 1 capsule (300 mg total) by mouth 2 (two) times daily. .  hydrocortisone (ANUSOL-HC) 2.5 %  rectal cream, Place 1 application rectally 2 (two) times daily.    Past medical history, social, surgical and family history all reviewed in electronic medical record.  Shane [name] pertanent information unless stated regarding to the chief complaint.   Review of Systems:  Shane [name] headache, visual changes, nausea, vomiting, diarrhea, constipation, dizziness, abdominal pain, skin rash, fevers, chills, night sweats, weight loss, swollen lymph nodes, body aches, joint swelling, muscle aches, chest pain, shortness of breath, mood changes.   Objective  Blood pressure 118/64, pulse 81, height 5\' 11"  (1.803 m), weight 205 lb (93 kg), SpO2 98 %.    General: Shane [name] apparent distress alert and oriented x3 mood and affect normal, dressed appropriately.  HEENT: Pupils equal, extraocular movements intact  Respiratory: Patient's speak in full sentences and does  not appear short of breath  Cardiovascular: Shane [name] lower extremity edema, non tender, Shane [name] erythema  Skin: Warm dry intact with Shane [name] signs of infection or rash on extremities or on axial skeleton.  Abdomen: Soft nontender  Neuro: Cranial nerves II through XII are intact, neurovascularly intact in all extremities with 2+ DTRs and 2+ pulses.  Lymph: Shane [name] lymphadenopathy of posterior or anterior cervical chain or axillae bilaterally.  Gait normal with good balance and coordination.  MSK:  Non tender with full range of motion and good stability and symmetric strength and tone of shoulders, elbows, wrist, hip, and ankles bilaterally.  Knee: Bilateral valgus deformity noted. Large thigh to calf ratio.  Tender to palpation over medial and PF joint line.  ROM full in flexion and extension and lower leg rotation. instability with valgus force.  painful patellar compression. Patellar glide with moderate crepitus. Patellar and quadriceps tendons unremarkable. Hamstring and quadriceps strength is normal.  After informed written and verbal consent, patient was seated on exam table. Right knee was prepped with alcohol swab and utilizing anterolateral approach, patient's right knee space was injected with 60 mg (3 mL's of Durolene(sodium hyaluronate) in a prefilled syringe was injected easily into the knee through a 22-gauge needle..Patient tolerated the procedure well without immediate complications.  After informed written and verbal consent, patient was seated on exam table. Left knee was prepped with alcohol swab and utilizing anterolateral approach, patient's left knee space was injected with 60 mg per and 3 mL's of  Durolene(sodium hyaluronate) in a prefilled syringe was injected easily into the knee through a 22-gauge needle..Patient tolerated the procedure well without immediate complications.    Impression and Recommendations:     This case required medical decision making of moderate complexity. The  above documentation has been reviewed and is accurate and complete Shane Pulley, DO       Note: This dictation was prepared with Dragon dictation along with smaller phrase technology. Any transcriptional errors that result from this process are unintentional.

## 2019-08-04 NOTE — Patient Instructions (Signed)
Brace day/night for 2 weeks then at night for 2 weeks If not better can inject carpal tunnel  See me in 5-6 weeks

## 2019-08-04 NOTE — Assessment & Plan Note (Signed)
Viscosupplementation given today.  Tolerated procedures well.  Discussed icing regimen and home exercises.  Has responded fairly well to Visco supplementation previously and hopefully will again.  Follow-up again in 6 to 8 weeks

## 2019-08-17 ENCOUNTER — Other Ambulatory Visit: Payer: Self-pay | Admitting: Family Medicine

## 2019-08-17 DIAGNOSIS — M170 Bilateral primary osteoarthritis of knee: Secondary | ICD-10-CM

## 2019-08-22 ENCOUNTER — Other Ambulatory Visit: Payer: Self-pay

## 2019-08-22 DIAGNOSIS — M170 Bilateral primary osteoarthritis of knee: Secondary | ICD-10-CM

## 2019-08-22 MED ORDER — DICLOFENAC SODIUM 75 MG PO TBEC: 75.0000 mg | DELAYED_RELEASE_TABLET | Freq: Two times a day (BID) | ORAL | 1 refills | Status: DC

## 2019-08-22 MED ORDER — GABAPENTIN 300 MG PO CAPS: 300.0000 mg | ORAL_CAPSULE | Freq: Two times a day (BID) | ORAL | 1 refills | Status: DC

## 2019-08-22 NOTE — Telephone Encounter (Signed)
Pt left msg requesting a refill for diclofenac 75mg  & gabapentin 300mg .

## 2019-08-30 ENCOUNTER — Encounter: Payer: Self-pay | Admitting: Family

## 2019-08-30 ENCOUNTER — Telehealth: Payer: Self-pay | Admitting: Family

## 2019-08-30 ENCOUNTER — Ambulatory Visit (INDEPENDENT_AMBULATORY_CARE_PROVIDER_SITE_OTHER): Payer: 59 | Admitting: Family

## 2019-08-30 ENCOUNTER — Other Ambulatory Visit (INDEPENDENT_AMBULATORY_CARE_PROVIDER_SITE_OTHER): Payer: 59

## 2019-08-30 ENCOUNTER — Other Ambulatory Visit: Payer: Self-pay

## 2019-08-30 VITALS — BP 126/74 | HR 68 | Temp 98.1°F | Ht 71.0 in | Wt 203.1 lb

## 2019-08-30 DIAGNOSIS — E785 Hyperlipidemia, unspecified: Secondary | ICD-10-CM

## 2019-08-30 DIAGNOSIS — Z23 Encounter for immunization: Secondary | ICD-10-CM | POA: Diagnosis not present

## 2019-08-30 DIAGNOSIS — Z125 Encounter for screening for malignant neoplasm of prostate: Secondary | ICD-10-CM

## 2019-08-30 DIAGNOSIS — E559 Vitamin D deficiency, unspecified: Secondary | ICD-10-CM | POA: Diagnosis not present

## 2019-08-30 DIAGNOSIS — Z0000 Encounter for general adult medical examination without abnormal findings: Secondary | ICD-10-CM

## 2019-08-30 DIAGNOSIS — M542 Cervicalgia: Secondary | ICD-10-CM

## 2019-08-30 LAB — COMPREHENSIVE METABOLIC PANEL
ALT: 17 U/L (ref 0–53)
AST: 18 U/L (ref 0–37)
Albumin: 4.3 g/dL (ref 3.5–5.2)
Alkaline Phosphatase: 131 U/L — ABNORMAL HIGH (ref 39–117)
BUN: 17 mg/dL (ref 6–23)
CO2: 23 mEq/L (ref 19–32)
Calcium: 9.2 mg/dL (ref 8.4–10.5)
Chloride: 107 mEq/L (ref 96–112)
Creatinine, Ser: 0.96 mg/dL (ref 0.40–1.50)
GFR: 93.74 mL/min (ref >60.00)
Glucose, Bld: 96 mg/dL (ref 70–99)
Potassium: 4.2 mEq/L (ref 3.5–5.1)
Sodium: 137 mEq/L (ref 135–145)
Total Bilirubin: 0.8 mg/dL (ref 0.2–1.2)
Total Protein: 6.9 g/dL (ref 6.0–8.3)

## 2019-08-30 LAB — CBC WITH DIFFERENTIAL/PLATELET
Basophils Absolute: 0.2 10*3/uL — ABNORMAL HIGH (ref 0.0–0.1)
Basophils Relative: 3.5 % — ABNORMAL HIGH (ref 0.0–3.0)
Eosinophils Absolute: 0.2 10*3/uL (ref 0.0–0.7)
Eosinophils Relative: 3.9 % (ref 0.0–5.0)
HCT: 35.9 % — ABNORMAL LOW (ref 39.0–52.0)
Hemoglobin: 11.8 g/dL — ABNORMAL LOW (ref 13.0–17.0)
Lymphocytes Relative: 23.6 % (ref 12.0–46.0)
Lymphs Abs: 1.1 10*3/uL (ref 0.7–4.0)
MCHC: 32.9 g/dL (ref 30.0–36.0)
MCV: 80.6 fl (ref 78.0–100.0)
Monocytes Absolute: 0.5 10*3/uL (ref 0.1–1.0)
Monocytes Relative: 9.8 % (ref 3.0–12.0)
Neutro Abs: 2.7 10*3/uL (ref 1.4–7.7)
Neutrophils Relative %: 59.2 % (ref 43.0–77.0)
Platelets: 281 10*3/uL (ref 150.0–400.0)
RBC: 4.45 Mil/uL (ref 4.22–5.81)
RDW: 15.6 % — ABNORMAL HIGH (ref 11.5–15.5)
WBC: 4.6 10*3/uL (ref 4.0–10.5)

## 2019-08-30 LAB — LIPID PANEL
Cholesterol: 120 mg/dL (ref 0–200)
HDL: 34.8 mg/dL — ABNORMAL LOW (ref >39.00)
LDL Cholesterol: 73 mg/dL (ref 0–99)
NonHDL: 84.73
Total CHOL/HDL Ratio: 3
Triglycerides: 61 mg/dL (ref 0.0–149.0)
VLDL: 12.2 mg/dL (ref 0.0–40.0)

## 2019-08-30 LAB — VITAMIN D 25 HYDROXY (VIT D DEFICIENCY, FRACTURES): VITD: <7 ng/mL — ABNORMAL LOW (ref 30.00–100.00)

## 2019-08-30 LAB — PSA: PSA: 0.54 ng/mL (ref 0.10–4.00)

## 2019-08-30 MED ORDER — DULOXETINE HCL 30 MG PO CPEP: ORAL_CAPSULE | ORAL | 3 refills | Status: DC

## 2019-08-30 MED ORDER — DULOXETINE HCL 30 MG PO CPEP: 30.0000 mg | ORAL_CAPSULE | Freq: Every day | ORAL | 3 refills | Status: DC

## 2019-08-30 MED ORDER — PRAVASTATIN SODIUM 20 MG PO TABS: 20.0000 mg | ORAL_TABLET | Freq: Every day | ORAL | 3 refills | Status: DC

## 2019-08-30 NOTE — Progress Notes (Signed)
Shane [name] is a 68 y.o. male with the following history as recorded in [employer]:  Patient Active Problem List   Diagnosis Date Noted  . Right carpal tunnel syndrome 09/19/2018  . 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 Abnormality of gait 02/23/2018  . Degenerative cervical disc 03/24/2016  . 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 Routine general medical examination at a health care facility 09/02/2015  . Numbness and tingling in hands 08/27/2015    Current Outpatient Medications  Medication Sig Dispense Refill  . diclofenac (VOLTAREN) 75 MG EC tablet Take 1 tablet (75 mg total) by mouth 2 (two) times daily. 180 tablet 1  . Diclofenac Sodium 2 % SOLN Place 2 g onto the skin 2 (two) times daily. 112 g 3  . gabapentin (NEURONTIN) 300 MG capsule Take 1 capsule (300 mg total) by mouth 2 (two) times daily. 120 capsule 1  . pravastatin (PRAVACHOL) 20 MG tablet Take 1 tablet (20 mg total) by mouth daily. 90 tablet 3  . Diclofenac Sodium (PENNSAID) 2 % SOLN Place 2 g onto the skin 2 (two) times daily. (Patient not taking: Reported on 08/30/2019) 112 g 3  . DULoxetine (CYMBALTA) 30 MG capsule Start with 1 per day as directed; increase to 2/ day as directed after 1 week 60 capsule 3   No current facility-administered medications for this visit.     Allergies: Patient has no known allergies.  Past Medical History:  Diagnosis Date  . Allergy   . Arthritis     History reviewed. No pertinent surgical history.  Family History  Problem Relation Age of Onset  . Healthy Mother   . Hypertension Father   . Stroke Father     Social History   Tobacco Use  . Smoking status: Former Smoker    Packs/day: 1.00    Years: 10.00    Pack years: 10.00    Types: Cigarettes    Quit date: 08/09/1978    Years since quitting: 41.0  . Smokeless tobacco: Never Used  Substance Use Topics  . Alcohol use: No    Alcohol/week: 0.0 standard drinks     Subjective:  Presents for yearly CPE; in baseline state of health today; continuing to work with Dr. Tamala Julian regularly for treament/ management of OA of knees; Up to date on dental and vision exams; Overdue for colon cancer screen- deferred colonoscopy at last OV; still hesitant to schedule and still considering Cologuard;  Feels that COVID has offered good benefit to his diet- not eating out as much/ portions are smaller;  Continuing to struggle with sensation of numbness/ tingling in his right wrist/ arm; limited benefit with steroid injection and Gabapentin; would like to discuss other treatment options.     Health Maintenance  Topic Date Due  . COLONOSCOPY  10/22/2026 (Originally 10/23/1999)  . TETANUS/TDAP  08/26/2025  . INFLUENZA VACCINE  Completed  . Hepatitis C Screening  Completed  . PNA vac Low Risk Adult  Completed   Review of Systems  Constitutional: Negative.   HENT: Negative.   Eyes: Negative.   Respiratory: Negative for shortness of breath.   Cardiovascular: Negative for chest pain.  Gastrointestinal: Negative for abdominal pain.  Genitourinary: Negative.   Musculoskeletal: Negative.   Skin: Negative.   Neurological: Positive for sensory change.  Psychiatric/Behavioral: Negative.        Objective: Vitals:   08/30/19 1055  BP: 126/74  Pulse: 68  Temp: 98.1 F (36.7 C)  TempSrc:  Oral  SpO2: 97%  Weight: 203 lb 1.9 oz (92.1 kg)  Height: 5' 11" (1.803 m)    General: Well developed, well nourished, in no acute distress  Skin : Warm and dry.  Head: Normocephalic and atraumatic  Eyes: Sclera and conjunctiva clear; pupils round and reactive to light; extraocular movements intact  Ears: ( L) External normal; canals clear; tympanic membranes normal; right cerumen impaction Oropharynx: Pink, supple. No suspicious lesions  Neck: Supple without thyromegaly, adenopathy  Lungs: Respirations unlabored; clear to auscultation bilaterally without wheeze, rales,  rhonchi  CVS exam: normal rate and regular rhythm.  Abdomen: Soft; nontender; nondistended; normoactive bowel sounds; no masses or hepatosplenomegaly  Musculoskeletal: No deformities; no active joint inflammation  Extremities: No edema, cyanosis, clubbing  Vessels: Symmetric bilaterally  Neurologic: Alert and oriented; speech intact; face symmetrical; moves all extremities well; CNII-XII intact without focal deficit   Assessment:  1. PE (physical exam), annual   2. Cervicalgia   3. Hyperlipidemia, unspecified hyperlipidemia type   4. Prostate cancer screening   5. Vitamin D deficiency     Plan:  Age appropriate preventive healthcare needs addressed; encouraged regular eye doctor and dental exams; encouraged regular exercise; will update labs and refills as needed today; follow-up to be determined; MRI updated of cervical spine; will then decide if he needs orthopedist or neurology based on results; will also d/c Gabapentin as he does not feel beneficial; trial of Duloxetine as alternative; Flu vaccine and Shingrix #2 updated today;  He will use OTC Debrox and return at later date for ear lavage of right ear;   No follow-ups on file.  Orders Placed This Encounter  Procedures  . MR Cervical Spine Wo Contrast    Standing Status:   Future    Standing Expiration Date:   10/29/2020    Order Specific Question:   What is the patient's sedation requirement?    Answer:   No Sedation    Order Specific Question:   Does the patient have a pacemaker or implanted devices?    Answer:   No    Order Specific Question:   Preferred imaging location?    Answer:   [facility] (table limit-550lbs)    Order Specific Question:   Radiology Contrast Protocol - do NOT remove file path    Answer:   _0 [email or web address]  . CBC w/Diff    Standing Status:   Future    Standing Expiration Date:   08/29/2020  . Comp Met (CMET)    Standing Status:   Future    Standing Expiration  Date:   08/29/2020  . Lipid panel    Standing Status:   Future    Standing Expiration Date:   08/29/2020  . PSA    Standing Status:   Future    Standing Expiration Date:   08/29/2020  . Vitamin D (25 hydroxy)    Standing Status:   Future    Standing Expiration Date:   08/29/2020    Requested Prescriptions   Signed Prescriptions Disp Refills  . pravastatin (PRAVACHOL) 20 MG tablet 90 tablet 3    Sig: Take 1 tablet (20 mg total) by mouth daily.  . DULoxetine (CYMBALTA) 30 MG capsule 60 capsule 3    Sig: Start with 1 per day as directed; increase to 2/ day as directed after 1 week

## 2019-08-30 NOTE — Telephone Encounter (Signed)
Please let him know that insurance will only pay for 30 mg tablet of Duloxetine once per day; if he needs increased dosage, we can change to the 60 mg tablet. I need him to start with 30 mg daily though.

## 2019-08-30 NOTE — Addendum Note (Signed)
Addended by: Marcina Millard on: 08/30/2019 01:55 PM   Modules accepted: Orders

## 2019-08-31 NOTE — Telephone Encounter (Signed)
Called and left message for patient today 

## 2019-09-01 ENCOUNTER — Other Ambulatory Visit: Payer: Self-pay | Admitting: Family

## 2019-09-01 DIAGNOSIS — D649 Anemia, unspecified: Secondary | ICD-10-CM

## 2019-09-01 DIAGNOSIS — R945 Abnormal results of liver function studies: Secondary | ICD-10-CM

## 2019-09-01 DIAGNOSIS — R7989 Other specified abnormal findings of blood chemistry: Secondary | ICD-10-CM

## 2019-09-01 MED ORDER — VITAMIN D (ERGOCALCIFEROL) 1.25 MG (50000 UNIT) PO CAPS: 50000.0000 [IU] | ORAL_CAPSULE | ORAL | 0 refills | Status: DC

## 2019-09-07 ENCOUNTER — Telehealth: Payer: Self-pay

## 2019-09-07 DIAGNOSIS — M5410 Radiculopathy, site unspecified: Secondary | ICD-10-CM

## 2019-09-07 DIAGNOSIS — M542 Cervicalgia: Secondary | ICD-10-CM

## 2019-09-08 NOTE — Telephone Encounter (Signed)
No, I am not going to try a Peer to Peer; they never approve anything. Let me just refer him to ortho as we discussed. Please let him know this is not unusual and the specialists can get the MRI ordered/ approved with no difficulty.

## 2019-09-08 NOTE — Telephone Encounter (Signed)
Spoke with patient and he has been informed

## 2019-09-14 ENCOUNTER — Encounter: Payer: Self-pay | Admitting: Family Medicine

## 2019-09-14 ENCOUNTER — Other Ambulatory Visit: Payer: Self-pay

## 2019-09-14 ENCOUNTER — Ambulatory Visit: Payer: 59 | Admitting: Family Medicine

## 2019-09-14 DIAGNOSIS — R200 Anesthesia of skin: Secondary | ICD-10-CM

## 2019-09-14 DIAGNOSIS — R202 Paresthesia of skin: Secondary | ICD-10-CM

## 2019-09-14 DIAGNOSIS — M79601 Pain in right arm: Secondary | ICD-10-CM

## 2019-09-14 NOTE — Progress Notes (Signed)
Office Visit Note   Patient: Shane [name]           Date of Birth: 1951-07-14           MRN: [id number] Visit Date: 09/14/2019 Requested by: Marrian Salvage, [place],  [place] [address] PCP: Marrian Salvage, FNP  Subjective: Chief Complaint  Patient presents with  . Right Hand - Numbness    Numbness/tingling in right hand x years. Right-hand dominant. No pain in neck. Tried PT and had carpal tunnel injection under US - guidance -- helped x 2 weeks, then symptoms returned.    HPI: He's here with right arm numbness and pain.  Onset about a year ago, no injury.  He started noticing intermittent tingling in his hand on the palm side of his thumb, index and third fingers.  He went to Dr. Paulla Fore who took some x-rays of his neck which showed significant degenerative changes.  Clinically Dr. Paulla Fore was suspicious for carpal tunnel syndrome and at one point injected his wrist which gave him complete relief for about 2 weeks.  Unfortunately the symptoms returned.  He was referred to physical therapy and he went for a month, they did cervical traction and other exercises.  He really did not notice any improvement in his arm discomfort and numbness but he did note that his neck range of motion improved.  He does not have significant neck pain.  He has started to notice some weakness in his grip, and he has difficulty doing fine motor activities with his fingers because he cannot feel things very well.               ROS: No fevers or chills.  All other systems were reviewed and are negative.  Objective: Vital Signs: There were no vitals taken for this visit.  Physical Exam:  General:  Alert and oriented, in no acute distress. Pulm:  Breathing unlabored. Psy:  Normal mood, congruent affect. Skin: No rash. Neck: He has good range of motion with negative Spurling's test, no tenderness to palpation of his neck.  Deltoid, rotator cuff, biceps, triceps, wrist and  intrinsic hand strength are normal today.  He has some thenar atrophy on the right compared to the left.  Positive Tinel's of the carpal tunnel, equivocal Phalen's test.  Imaging: None today.  Assessment & Plan: 1.  Chronic right arm numbness and pain, suspect carpal tunnel syndrome but cannot completely rule out a cervical radiculopathy. -We will order nerve conduction studies first.  If negative for carpal tunnel syndrome, then cervical MRI scan.  If he has severe carpal tunnel syndrome, we will refer him for surgical release.     Procedures: No procedures performed  No notes on file     PMFS History: Patient Active Problem List   Diagnosis Date Noted  . Right carpal tunnel syndrome 09/19/2018  . 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 Abnormality of gait 02/23/2018  . Degenerative cervical disc 03/24/2016  . 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 Routine general medical examination at a health care facility 09/02/2015  . Numbness and tingling in hands 08/27/2015   Past Medical History:  Diagnosis Date  . Allergy   . Arthritis     Family History  Problem Relation Age of Onset  . Healthy Mother   . Hypertension Father   . Stroke Father     History reviewed. No pertinent surgical history. Social History   Occupational  History  . Occupation: Armed forces technical officer  Tobacco Use  . Smoking status: Former Smoker    Packs/day: 1.00    Years: 10.00    Pack years: 10.00    Types: Cigarettes    Quit date: 08/09/1978    Years since quitting: 41.1  . Smokeless tobacco: Never Used  Substance and Sexual Activity  . Alcohol use: No    Alcohol/week: 0.0 standard drinks  . Drug use: No  . Sexual activity: Not on file

## 2019-09-15 ENCOUNTER — Encounter: Payer: Self-pay | Admitting: Family Medicine

## 2019-09-15 ENCOUNTER — Ambulatory Visit: Payer: 59 | Admitting: Family Medicine

## 2019-09-15 DIAGNOSIS — M170 Bilateral primary osteoarthritis of knee: Secondary | ICD-10-CM

## 2019-09-15 NOTE — Assessment & Plan Note (Signed)
Patient is doing significantly better at this time.  Discussed icing regimen versus: Discussed which activities to do which wants to avoid.  Patient should increase activity slowly.  Discussed icing regimen follow-up with me again in 6 weeks

## 2019-09-15 NOTE — Progress Notes (Signed)
[name] [place], [place] [address] Phone: [phone] Subjective:   I [name] am serving as a Education administrator for Dr. Hulan Saas.   CC: Bilateral knee pain  [id number]   08/04/2019 Viscosupplementation given today.  Tolerated procedures well.  Discussed icing regimen and home exercises.  Has responded fairly well to Visco supplementation previously and hopefully will again.  Follow-up again in 6 to 8 weeks  09/15/2019 Creed Beranek is a 68 y.o. male coming in with complaint of bilateral knee pain. States his knees have been doing better.  Discussed with patient about icing regimen and home exercise, discussed avoiding certain activities.  Patient should increase activity as tolerated.      Past Medical History:  Diagnosis Date  . Allergy   . Arthritis    No past surgical history on file. Social History   Socioeconomic History  . Marital status: Married    Spouse name: Not on file  . Number of children: 2  . Years of education: 6  . Highest education level: Not on file  Occupational History  . Occupation: Armed forces technical officer  Social Needs  . Financial resource strain: Not on file  . Food insecurity    Worry: Not on file    Inability: Not on file  . Transportation needs    Medical: Not on file    Non-medical: Not on file  Tobacco Use  . Smoking status: Former Smoker    Packs/day: 1.00    Years: 10.00    Pack years: 10.00    Types: Cigarettes    Quit date: 08/09/1978    Years since quitting: 41.1  . Smokeless tobacco: Never Used  Substance and Sexual Activity  . Alcohol use: No    Alcohol/week: 0.0 standard drinks  . Drug use: No  . Sexual activity: Not on file  Lifestyle  . Physical activity    Days per week: Not on file    Minutes per session: Not on file  . Stress: Not on file  Relationships  . Social Herbalist on phone: Not on file    Gets together: Not on file    Attends religious service:  Not on file    Active member of club or organization: Not on file    Attends meetings of clubs or organizations: Not on file    Relationship status: Not on file  Other Topics Concern  . Not on file  Social History Narrative   Fun: [name] out with his grandkids, 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   No Known Allergies Family History  Problem Relation Age of Onset  . Healthy Mother   . Hypertension Father   . Stroke Father      Current Outpatient Medications (Cardiovascular):  .  pravastatin (PRAVACHOL) 20 MG tablet, Take 1 tablet (20 mg total) by mouth daily.   Current Outpatient Medications (Analgesics):  .  diclofenac (VOLTAREN) 75 MG EC tablet, Take 1 tablet (75 mg total) by mouth 2 (two) times daily.   Current Outpatient Medications (Other):  Marland Kitchen  Diclofenac Sodium (PENNSAID) 2 % SOLN, Place 2 g onto the skin 2 (two) times daily. (Patient not taking: Reported on 08/30/2019) .  Diclofenac Sodium 2 % SOLN, Place 2 g onto the skin 2 (two) times daily. .  DULoxetine (CYMBALTA) 30 MG capsule, Take 1 capsule (30 mg total) by mouth daily. Marland Kitchen  gabapentin (NEURONTIN) 300 MG capsule, Take 1 capsule (300 mg total) by mouth  2 (two) times daily. .  Vitamin D, Ergocalciferol, (DRISDOL) 1.25 MG (50000 UT) CAPS capsule, Take 1 capsule (50,000 Units total) by mouth every 7 (seven) days for 12 doses.    Past medical history, social, surgical and family history all reviewed in electronic medical record.  No pertanent information unless stated regarding to the chief complaint.   Review of Systems:  No headache, visual changes, nausea, vomiting, diarrhea, constipation, dizziness, abdominal pain, skin rash, fevers, chills, night sweats, weight loss, swollen lymph nodes, body aches, joint swelling, muscle aches, chest pain, shortness of breath, mood changes.   Objective  There were no vitals taken for this visit. Systems examined below as of    General: No apparent distress alert and oriented x3 mood and  affect normal, dressed appropriately.  HEENT: Pupils equal, extraocular movements intact  Respiratory: Patient's speak in full sentences and does not appear short of breath  Cardiovascular: No lower extremity edema, non tender, no erythema  Skin: Warm dry intact with no signs of infection or rash on extremities or on axial skeleton.  Abdomen: Soft nontender  Neuro: Cranial nerves II through XII are intact, neurovascularly intact in all extremities with 2+ DTRs and 2+ pulses.  Lymph: No lymphadenopathy of posterior or anterior cervical chain or axillae bilaterally.  Gait normal with good balance and coordination.  MSK:  Non tender with full range of motion and good stability and symmetric strength and tone of shoulders, elbows, wrist, hip, and ankles bilaterally.  Knee: Bilateral valgus deformity noted.  Abnormal thigh to calf ratio.  Tender to palpation over medial and PF joint line.  Less tenderness than previous exam ROM full in flexion and extension and lower leg rotation. instability with valgus force.  painful patellar compression. Patellar glide with moderate crepitus. Patellar and quadriceps tendons unremarkable. Hamstring and quadriceps strength is normal.     Impression and Recommendations:     The above documentation has been reviewed and is accurate and complete Lyndal Pulley, DO       Note: This dictation was prepared with Dragon dictation along with smaller phrase technology. Any transcriptional errors that result from this process are unintentional.

## 2019-09-15 NOTE — Patient Instructions (Signed)
[phone] See me again in 6 weeks for steroid injection

## 2019-10-10 ENCOUNTER — Other Ambulatory Visit: Payer: Self-pay | Admitting: Family

## 2019-10-10 ENCOUNTER — Ambulatory Visit (INDEPENDENT_AMBULATORY_CARE_PROVIDER_SITE_OTHER): Payer: 59 | Admitting: Physical Medicine and Rehabilitation

## 2019-10-10 ENCOUNTER — Telehealth: Payer: Self-pay | Admitting: Family Medicine

## 2019-10-10 ENCOUNTER — Encounter: Payer: Self-pay | Admitting: Physical Medicine and Rehabilitation

## 2019-10-10 ENCOUNTER — Other Ambulatory Visit: Payer: Self-pay

## 2019-10-10 DIAGNOSIS — R202 Paresthesia of skin: Secondary | ICD-10-CM | POA: Diagnosis not present

## 2019-10-10 NOTE — Telephone Encounter (Signed)
Returned call to patient left message to call back per his request to reschedule appointment

## 2019-10-10 NOTE — Progress Notes (Signed)
  Numeric Pain Rating Scale and Functional Assessment Average Pain 6   In the last MONTH (on 0-10 scale) has pain interfered with the following?  1. General activity like being  able to carry out your everyday physical activities such as walking, climbing stairs, carrying groceries, or moving a chair?  Rating(6)

## 2019-10-10 NOTE — Procedures (Signed)
EMG & NCV Findings: Evaluation of the right median motor nerve showed prolonged distal onset latency (6.4 ms), reduced amplitude (0.0 mV), and decreased conduction velocity (Elbow-Wrist, 34 m/s).  The right median (across palm) sensory nerve showed no response (Wrist) and no response (Palm).  The right ulnar sensory nerve showed reduced amplitude (11.4 V).  All remaining nerves (as indicated in the following tables) were within normal limits.    Needle evaluation of the right abductor pollicis brevis muscle showed decreased insertional activity, widespread spontaneous activity, and diminished recruitment.  All remaining muscles (as indicated in the following table) showed no evidence of electrical instability.    Impression: The above electrodiagnostic study is ABNORMAL and reveals evidence of a severe right median nerve entrapment at the wrist (carpal tunnel syndrome) affecting sensory and motor components. The lesion is characterized by sensory and motor demyelination with evidence of significant axonal injury.  Prognostically he may regain some strength after appropriate decompression surgery but he likely would have some permanent deficits particularly with sensation.  There is no significant electrodiagnostic evidence of any other focal nerve entrapment, brachial plexopathy or cervical radiculopathy.   Recommendations: 1.  Follow-up with referring physician. 2.  Continue current management of symptoms. 3.  Suggest surgical evaluation.  ___________________________ Laurence Spates FAAPMR Board Certified, American Board of Physical Medicine and Rehabilitation    Nerve Conduction Studies Anti Sensory Summary Table   Stim Site NR Peak (ms) Norm Peak (ms) P-T Amp (V) Norm P-T Amp Site1 Site2 Delta-P (ms) Dist (cm) Vel (m/s) Norm Vel (m/s)  Right Median Acr Palm Anti Sensory (2nd Digit)  28.5C  Wrist *NR  <3.6  >10 Wrist Palm  0.0    Palm *NR  <2.0          Right Radial Anti Sensory (Base  1st Digit)  28.5C  Wrist    2.2 <3.1 21.4  Wrist Base 1st Digit 2.2 0.0    Right Ulnar Anti Sensory (5th Digit)  26.9C  Wrist    3.3 <3.7 *11.4 >15.0 Wrist 5th Digit 3.3 14.0 42 >38   Motor Summary Table   Stim Site NR Onset (ms) Norm Onset (ms) O-P Amp (mV) Norm O-P Amp Site1 Site2 Delta-0 (ms) Dist (cm) Vel (m/s) Norm Vel (m/s)  Right Median Motor (Abd Poll Brev)  27.9C  Wrist    *6.4 <4.2 *0.0 >5 Elbow Wrist 7.4 25.0 *34 >50  Elbow    13.8  0.2         Right Ulnar Motor (Abd Dig Min)  Wrist    3.2 <4.2 5.7 >3 B Elbow Wrist 4.4 24.5 56 >53  B Elbow    7.6  4.9  A Elbow B Elbow 1.4 10.0 71 >53  A Elbow    9.0  5.6          EMG   Side Muscle Nerve Root Ins Act Fibs Psw Amp Dur Poly Recrt Int Fraser Din Comment  Right Abd Poll Brev Median C8-T1 *Decr *4+ *4+ Nml Nml 0 *Reduced Nml rare distant muap  Right 1stDorInt Ulnar C8-T1 Nml Nml Nml Nml Nml 0 Nml Nml   Right PronatorTeres Median C6-7 Nml Nml Nml Nml Nml 0 Nml Nml   Right Biceps Musculocut C5-6 Nml Nml Nml Nml Nml 0 Nml Nml   Right Deltoid Axillary C5-6 Nml Nml Nml Nml Nml 0 Nml Nml     Nerve Conduction Studies Anti Sensory Left/Right Comparison   Stim Site L Lat (ms) R Lat (ms) L-R Lat (  ms) L Amp (V) R Amp (V) L-R Amp (%) Site1 Site2 L Vel (m/s) R Vel (m/s) L-R Vel (m/s)  Median Acr Palm Anti Sensory (2nd Digit)  28.5C  Wrist       Wrist Palm     Palm             Radial Anti Sensory (Base 1st Digit)  28.5C  Wrist  2.2   21.4  Wrist Base 1st Digit     Ulnar Anti Sensory (5th Digit)  26.9C  Wrist  3.3   *11.4  Wrist 5th Digit  42    Motor Left/Right Comparison   Stim Site L Lat (ms) R Lat (ms) L-R Lat (ms) L Amp (mV) R Amp (mV) L-R Amp (%) Site1 Site2 L Vel (m/s) R Vel (m/s) L-R Vel (m/s)  Median Motor (Abd Poll Brev)  27.9C  Wrist  *6.4   *0.0  Elbow Wrist  *34   Elbow  13.8   0.2        Ulnar Motor (Abd Dig Min)  Wrist  3.2   5.7  B Elbow Wrist  56   B Elbow  7.6   4.9  A Elbow B Elbow  71   A Elbow  9.0   5.6            Waveforms:

## 2019-10-10 NOTE — Progress Notes (Signed)
Shane [name] - 68 y.o. male MRN [id number]  Date of birth: 1951-02-22  Office Visit Note: Visit Date: 10/10/2019 PCP: Marrian Salvage, FNP Referred by: Marrian Salvage,*  Subjective: Chief Complaint  Patient presents with  . Right Hand - Numbness   HPI: Shane [name] is a 68 y.o. male who comes in today At the request of Dr. Legrand Como hilts for electrodiagnostic study of the right upper limb.  Patient reports chronic longtime history of pain numbness and tingling in the right hand.  He really did not seek much care initially he did have some physical therapy and he saw Dr. Teresa Coombs who felt like he probably had carpal tunnel syndrome and completed carpal tunnel injection with a couple of weeks relief.  He is right-hand dominant.  He denies any specific radicular pain down the arm.  He has had some issues with neck pain and what was felt to be may be radicular pain in the past.  He started to notice some weakness of the hand trouble with buttoning close.  He has pretty frank numbness in the median nerve distribution on the right.  He also started noticing some muscle bulk loss in the APB on the right.  Left hand seems to be doing okay.  He has had no prior electrodiagnostic studies.  ROS Otherwise per HPI.  Assessment & Plan: Visit Diagnoses:  1. Paresthesia of skin     Plan: Impression: The above electrodiagnostic study is ABNORMAL and reveals evidence of a severe right median nerve entrapment at the wrist (carpal tunnel syndrome) affecting sensory and motor components. The lesion is characterized by sensory and motor demyelination with evidence of significant axonal injury.  Prognostically he may regain some strength after appropriate decompression surgery but he likely would have some permanent deficits particularly with sensation.  There is no significant electrodiagnostic evidence of any other focal nerve entrapment, brachial plexopathy or cervical radiculopathy.    Recommendations: 1.  Follow-up with referring physician. 2.  Continue current management of symptoms. 3.  Suggest surgical evaluation.  Meds & Orders: No orders of the defined types were placed in this encounter.   Orders Placed This Encounter  Procedures  . NCV with EMG (electromyography)    Follow-up: Return in about 2 weeks (around 10/24/2019) for Eunice Blase, MD.   Procedures: No procedures performed  EMG & NCV Findings: Evaluation of the right median motor nerve showed prolonged distal onset latency (6.4 ms), reduced amplitude (0.0 mV), and decreased conduction velocity (Elbow-Wrist, 34 m/s).  The right median (across palm) sensory nerve showed no response (Wrist) and no response (Palm).  The right ulnar sensory nerve showed reduced amplitude (11.4 V).  All remaining nerves (as indicated in the following tables) were within normal limits.    Needle evaluation of the right abductor pollicis brevis muscle showed decreased insertional activity, widespread spontaneous activity, and diminished recruitment.  All remaining muscles (as indicated in the following table) showed no evidence of electrical instability.    Impression: The above electrodiagnostic study is ABNORMAL and reveals evidence of a severe right median nerve entrapment at the wrist (carpal tunnel syndrome) affecting sensory and motor components. The lesion is characterized by sensory and motor demyelination with evidence of significant axonal injury.  Prognostically he may regain some strength after appropriate decompression surgery but he likely would have some permanent deficits particularly with sensation.  There is no significant electrodiagnostic evidence of any other focal nerve entrapment, brachial plexopathy or cervical radiculopathy.   Recommendations:  1.  Follow-up with referring physician. 2.  Continue current management of symptoms. 3.  Suggest surgical evaluation.  ___________________________ Laurence Spates  FAAPMR Board Certified, American Board of Physical Medicine and Rehabilitation    Nerve Conduction Studies Anti Sensory Summary Table   Stim Site NR Peak (ms) Norm Peak (ms) P-T Amp (V) Norm P-T Amp Site1 Site2 Delta-P (ms) Dist (cm) Vel (m/s) Norm Vel (m/s)  Right Median Acr Palm Anti Sensory (2nd Digit)  28.5C  Wrist *NR  <3.6  >10 Wrist Palm  0.0    Palm *NR  <2.0          Right Radial Anti Sensory (Base 1st Digit)  28.5C  Wrist    2.2 <3.1 21.4  Wrist Base 1st Digit 2.2 0.0    Right Ulnar Anti Sensory (5th Digit)  26.9C  Wrist    3.3 <3.7 *11.4 >15.0 Wrist 5th Digit 3.3 14.0 42 >38   Motor Summary Table   Stim Site NR Onset (ms) Norm Onset (ms) O-P Amp (mV) Norm O-P Amp Site1 Site2 Delta-0 (ms) Dist (cm) Vel (m/s) Norm Vel (m/s)  Right Median Motor (Abd Poll Brev)  27.9C  Wrist    *6.4 <4.2 *0.0 >5 Elbow Wrist 7.4 25.0 *34 >50  Elbow    13.8  0.2         Right Ulnar Motor (Abd Dig Min)  Wrist    3.2 <4.2 5.7 >3 B Elbow Wrist 4.4 24.5 56 >53  B Elbow    7.6  4.9  A Elbow B Elbow 1.4 10.0 71 >53  A Elbow    9.0  5.6          EMG   Side Muscle Nerve Root Ins Act Fibs Psw Amp Dur Poly Recrt Int Fraser Din Comment  Right Abd Poll Brev Median C8-T1 *Decr *4+ *4+ Nml Nml 0 *Reduced Nml rare distant muap  Right 1stDorInt Ulnar C8-T1 Nml Nml Nml Nml Nml 0 Nml Nml   Right PronatorTeres Median C6-7 Nml Nml Nml Nml Nml 0 Nml Nml   Right Biceps Musculocut C5-6 Nml Nml Nml Nml Nml 0 Nml Nml   Right Deltoid Axillary C5-6 Nml Nml Nml Nml Nml 0 Nml Nml     Nerve Conduction Studies Anti Sensory Left/Right Comparison   Stim Site L Lat (ms) R Lat (ms) L-R Lat (ms) L Amp (V) R Amp (V) L-R Amp (%) Site1 Site2 L Vel (m/s) R Vel (m/s) L-R Vel (m/s)  Median Acr Palm Anti Sensory (2nd Digit)  28.5C  Wrist       Wrist Palm     Palm             Radial Anti Sensory (Base 1st Digit)  28.5C  Wrist  2.2   21.4  Wrist Base 1st Digit     Ulnar Anti Sensory (5th Digit)  26.9C  Wrist  3.3   *11.4   Wrist 5th Digit  42    Motor Left/Right Comparison   Stim Site L Lat (ms) R Lat (ms) L-R Lat (ms) L Amp (mV) R Amp (mV) L-R Amp (%) Site1 Site2 L Vel (m/s) R Vel (m/s) L-R Vel (m/s)  Median Motor (Abd Poll Brev)  27.9C  Wrist  *6.4   *0.0  Elbow Wrist  *34   Elbow  13.8   0.2        Ulnar Motor (Abd Dig Min)  Wrist  3.2   5.7  B Elbow Wrist  56  B Elbow  7.6   4.9  A Elbow B Elbow  71   A Elbow  9.0   5.6           Waveforms:            Clinical History: No specialty comments available.   He reports that he quit smoking about 41 years ago. His smoking use included cigarettes. He has a 10.00 pack-year smoking history. He has never used smokeless tobacco. No results for input(s): HGBA1C, LABURIC in the last 8760 hours.  Objective:  VS:  HT:    WT:   BMI:     BP:   HR: bpm  TEMP: ( )  RESP:  Physical Exam Musculoskeletal:        General: No tenderness.     Comments: Inspection reveals atrophy of the right APB but no atrophy of the left APB or bilateral  FDI or hand intrinsics. There is no swelling, color changes, allodynia or dystrophic changes. There is 5 out of 5 strength in the bilateral wrist extension, finger abduction and long finger flexion.  Decreased sensation in the median nerve distribution on the right.  There is a negative Froment's test bilaterally.  There is a negative Hoffmann's test bilaterally.  Skin:    General: Skin is warm and dry.     Findings: No erythema or rash.  Neurological:     General: No focal deficit present.     Mental Status: He is alert and oriented to person, place, and time.     Sensory: No sensory deficit.     Motor: No weakness or abnormal muscle tone.     Coordination: Coordination normal.     Gait: Gait normal.  Psychiatric:        Mood and Affect: Mood normal.        Behavior: Behavior normal.        Thought Content: Thought content normal.     Ortho Exam Imaging: No results found.  Past Medical/Family/Surgical/Social  History: Medications & Allergies reviewed per EMR, new medications updated. Patient Active Problem List   Diagnosis Date Noted  . Right carpal tunnel syndrome 09/19/2018  . 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 Abnormality of gait 02/23/2018  . Degenerative cervical disc 03/24/2016  . 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 Routine general medical examination at a health care facility 09/02/2015  . Numbness and tingling in hands 08/27/2015   Past Medical History:  Diagnosis Date  . Allergy   . Arthritis    Family History  Problem Relation Age of Onset  . Healthy Mother   . Hypertension Father   . Stroke Father    History reviewed. No pertinent surgical history. Social History   Occupational History  . Occupation: Armed forces technical officer  Tobacco Use  . Smoking status: Former Smoker    Packs/day: 1.00    Years: 10.00    Pack years: 10.00    Types: Cigarettes    Quit date: 08/09/1978    Years since quitting: 41.1  . Smokeless tobacco: Never Used  Substance and Sexual Activity  . Alcohol use: No    Alcohol/week: 0.0 standard drinks  . Drug use: No  . Sexual activity: Not on file

## 2019-10-17 ENCOUNTER — Encounter: Payer: Self-pay | Admitting: Family Medicine

## 2019-10-23 ENCOUNTER — Encounter: Payer: Self-pay | Admitting: Family Medicine

## 2019-10-23 ENCOUNTER — Ambulatory Visit: Payer: 59 | Admitting: Family Medicine

## 2019-10-23 ENCOUNTER — Ambulatory Visit (INDEPENDENT_AMBULATORY_CARE_PROVIDER_SITE_OTHER): Payer: 59 | Admitting: Family Medicine

## 2019-10-23 ENCOUNTER — Other Ambulatory Visit: Payer: Self-pay

## 2019-10-23 DIAGNOSIS — G5601 Carpal tunnel syndrome, right upper limb: Secondary | ICD-10-CM

## 2019-10-23 DIAGNOSIS — M170 Bilateral primary osteoarthritis of knee: Secondary | ICD-10-CM

## 2019-10-23 NOTE — Assessment & Plan Note (Addendum)
Bilateral injection given today, discussed icing regimen and home exercise, discussed which activities are doing which wants to avoid.  Patient is to increase activity slowly over the course the next several weeks.  Patient will follow up with me again in 4 to 8 weeks discussed with patient can repeat injections every 10 weeks if he wants to stick with the steroid injections.

## 2019-10-23 NOTE — Patient Instructions (Signed)
  [address] 1st floor [place], [place] [address] Phone [phone]  Injected both knees See me again in 8 weeks

## 2019-10-23 NOTE — Patient Instructions (Signed)
   Wednesday with Dr. Erlinda Hong at 1:30 pm.

## 2019-10-23 NOTE — Progress Notes (Signed)
[name] [address] [place], [place] [address] Phone: [phone] Subjective:   [name], am serving as a scribe for Dr. Hulan Saas. This visit occurred during the SARS-CoV-2 public health emergency.  Safety protocols were in place, including screening questions prior to the visit, additional usage of staff PPE, and extensive cleaning of exam room while observing appropriate contact time as indicated for disinfecting solutions.    CC: bilateral knee pain   [id number]   09/15/2019 Patient is doing significantly better at this time.  Discussed icing regimen versus: Discussed which activities to do which wants to avoid.  Patient should increase activity slowly.  Discussed icing regimen follow-up with me again in 6 weeks  Update 10/23/2019 Shane [name] is a 67 y.o. male coming in with complaint of bilateral knee pain. Patient is here today for steroid injections. Complains that knees are "achy."  Patient states that the viscosupplementation given 3 months ago did not make any significant improvement.  Continues to have instability and intermittent swelling.  Patient rates the severity of pain of 7 out of 10.     Past Medical History:  Diagnosis Date  . Allergy   . Arthritis    No past surgical history on file. Social History   Socioeconomic History  . Marital status: Married    Spouse name: Not on file  . Number of children: 2  . Years of education: 40  . Highest education level: Not on file  Occupational History  . Occupation: Armed forces technical officer  Tobacco Use  . Smoking status: Former Smoker    Packs/day: 1.00    Years: 10.00    Pack years: 10.00    Types: Cigarettes    Quit date: 08/09/1978    Years since quitting: 41.2  . Smokeless tobacco: Never Used  Substance and Sexual Activity  . Alcohol use: No    Alcohol/week: 0.0 standard drinks  . Drug use: No  . Sexual activity: Not on file  Other Topics Concern  . Not on file    Social History Narrative   Fun: [name] out with his grandkids, 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   Social Determinants of Radio broadcast assistant Strain:   . Difficulty of Paying Living Expenses: Not on file  Food Insecurity:   . Worried About Charity fundraiser in the Last Year: Not on file  . Ran Out of Food in the Last Year: Not on file  Transportation Needs:   . Lack of Transportation (Medical): Not on file  . Lack of Transportation (Non-Medical): Not on file  Physical Activity:   . Days of Exercise per Week: Not on file  . Minutes of Exercise per Session: Not on file  Stress:   . Feeling of Stress : Not on file  Social Connections:   . Frequency of Communication with Friends and Family: Not on file  . Frequency of Social Gatherings with Friends and Family: Not on file  . Attends Religious Services: Not on file  . Active Member of Clubs or Organizations: Not on file  . Attends Archivist Meetings: Not on file  . Marital Status: Not on file   No Known Allergies Family History  Problem Relation Age of Onset  . Healthy Mother   . Hypertension Father   . Stroke Father      Current Outpatient Medications (Cardiovascular):  .  pravastatin (PRAVACHOL) 20 MG tablet, TAKE 1 TABLET(20 MG) BY MOUTH DAILY  Current Outpatient Medications (Analgesics):  .  diclofenac (VOLTAREN) 75 MG EC tablet, Take 1 tablet (75 mg total) by mouth 2 (two) times daily.   Current Outpatient Medications (Other):  Marland Kitchen  Diclofenac Sodium (PENNSAID) 2 % SOLN, Place 2 g onto the skin 2 (two) times daily. .  Diclofenac Sodium 2 % SOLN, Place 2 g onto the skin 2 (two) times daily. .  DULoxetine (CYMBALTA) 30 MG capsule, Take 1 capsule (30 mg total) by mouth daily. Marland Kitchen  gabapentin (NEURONTIN) 300 MG capsule, Take 1 capsule (300 mg total) by mouth 2 (two) times daily. .  Vitamin D, Ergocalciferol, (DRISDOL) 1.25 MG (50000 UT) CAPS capsule, Take 1 capsule (50,000 Units total) by mouth every 7 (seven) days  for 12 doses.    Past medical history, social, surgical and family history all reviewed in electronic medical record.  No pertanent information unless stated regarding to the chief complaint.   Review of Systems:  No headache, visual changes, nausea, vomiting, diarrhea, constipation, dizziness, abdominal pain, skin rash, fevers, chills, night sweats, weight loss, swollen lymph nodes, body aches, joint swelling,  chest pain, shortness of breath, mood changes.  Positive muscle aches  Objective  Blood pressure 140/86, pulse 74, height 5\' 11"  (1.803 m), weight 208 lb (94.3 kg), SpO2 99 %.   General: No apparent distress alert and oriented x3 mood and affect normal, dressed appropriately.  HEENT: Pupils equal, extraocular movements intact  Respiratory: Patient's speak in full sentences and does not appear short of breath  Cardiovascular: No lower extremity edema, non tender, no erythema  Skin: Warm dry intact with no signs of infection or rash on extremities or on axial skeleton.  Abdomen: Soft nontender  Neuro: Cranial nerves II through XII are intact, neurovascularly intact in all extremities with 2+ DTRs and 2+ pulses.  Lymph: No lymphadenopathy of posterior or anterior cervical chain or axillae bilaterally.  Gait mild antalgic MSK:  tender with full range of motion and good stability and symmetric strength and tone of shoulders, elbows, wrist, hip and ankles bilaterally.  Arthritic changes of multiple joints Knee: Bilateral valgus deformity noted.  Abnormal thigh to calf ratio.  Tender to palpation over medial and PF joint line.  ROM full in flexion and extension and lower leg rotation. instability with valgus force.  painful patellar compression. Patellar glide with moderate crepitus. Patellar and quadriceps tendons unremarkable. Hamstring and quadriceps strength is normal.  After informed written and verbal consent, patient was seated on exam table. Right knee was prepped with  alcohol swab and utilizing anterolateral approach, patient's right knee space was injected with 4:1  marcaine 0.5%: Kenalog 40mg /dL. Patient tolerated the procedure well without immediate complications.  After informed written and verbal consent, patient was seated on exam table. Left knee was prepped with alcohol swab and utilizing anterolateral approach, patient's left knee space was injected with 4:1  marcaine 0.5%: Kenalog 40mg /dL. Patient tolerated the procedure well without immediate complications.     Impression and Recommendations:     This case required medical decision making of moderate complexity. The above documentation has been reviewed and is accurate and complete Lyndal Pulley, DO       Note: This dictation was prepared with Dragon dictation along with smaller phrase technology. Any transcriptional errors that result from this process are unintentional.

## 2019-10-23 NOTE — Progress Notes (Signed)
   Office Visit Note   Patient: Shane [name]           Date of Birth: [birth year]           MRN: [id number] Visit Date: 10/23/2019 Requested by: Marrian Salvage, [place],  [place] [address] PCP: Marrian Salvage, FNP  Subjective: Chief Complaint  Patient presents with  . Right Hand - Pain    Follow up post nerve conduction studies. No change in symptoms since last visit.    HPI: He is here for follow-up chronic right arm and hand numbness.  He is here to discuss nerve study results.  No change in his symptoms.  He has started to lose strength and dexterity in the past few months.              ROS:   All other systems were reviewed and are negative.  Objective: Vital Signs: There were no vitals taken for this visit.  Physical Exam:  General:  Alert and oriented, in no acute distress. Pulm:  Breathing unlabored. Psy:  Normal mood, congruent affect.  Right hand: He has mild to moderate thenar atrophy but overall his grip strength is still pretty good.  Imaging: None today  Assessment & Plan: 1.  Chronic right arm numbness with nerve studies showing severe carpal tunnel syndrome, no sign of cervical radiculopathy. -We discussed options, I recommended surgical consultation.  We will arrange this with Dr. Erlinda Hong.  He will follow-up with him in the near future. -Patient is still working as a Merchant navy officer.  He will likely need to be out of work for 2 to 4 weeks, but he will discuss this with Dr. Erlinda Hong.     Procedures: No procedures performed  No notes on file     PMFS History: Patient Active Problem List   Diagnosis Date Noted  . Right carpal tunnel syndrome 09/19/2018  . 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 Abnormality of gait 02/23/2018  . Degenerative cervical disc 03/24/2016  . 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 Routine general medical examination at a health care facility 09/02/2015  .  Numbness and tingling in hands 08/27/2015   Past Medical History:  Diagnosis Date  . Allergy   . Arthritis     Family History  Problem Relation Age of Onset  . Healthy Mother   . Hypertension Father   . Stroke Father     History reviewed. No pertinent surgical history. Social History   Occupational History  . Occupation: Armed forces technical officer  Tobacco Use  . Smoking status: Former Smoker    Packs/day: 1.00    Years: 10.00    Pack years: 10.00    Types: Cigarettes    Quit date: 08/09/1978    Years since quitting: 41.2  . Smokeless tobacco: Never Used  Substance and Sexual Activity  . Alcohol use: No    Alcohol/week: 0.0 standard drinks  . Drug use: No  . Sexual activity: Not on file

## 2019-10-24 ENCOUNTER — Ambulatory Visit: Payer: 59 | Admitting: Family Medicine

## 2019-10-25 ENCOUNTER — Ambulatory Visit: Payer: 59 | Admitting: Orthopaedic Surgery

## 2019-10-26 ENCOUNTER — Ambulatory Visit: Payer: 59 | Admitting: Orthopaedic Surgery

## 2019-10-26 ENCOUNTER — Encounter: Payer: Self-pay | Admitting: Orthopaedic Surgery

## 2019-10-26 ENCOUNTER — Other Ambulatory Visit: Payer: Self-pay

## 2019-10-26 DIAGNOSIS — G5601 Carpal tunnel syndrome, right upper limb: Secondary | ICD-10-CM

## 2019-10-26 NOTE — Progress Notes (Signed)
Office Visit Note   Patient: Shane [name]           Date of Birth: 14-Sep-1951           MRN: [id number] Visit Date: 10/26/2019              Requested by: Marrian Salvage, [place],  [place] [address] PCP: Marrian Salvage, FNP   Assessment & Plan: Visit Diagnoses:  1. Right carpal tunnel syndrome     Plan: Impression is right severe carpal tunnel syndrome.  I reviewed the nerve conduction studies today with the patient in detail.  We had a discussion on multiple treatment options and based on this discussion patient has elected to proceed with right carpal tunnel release in the near future.  Risk benefits alternatives were discussed as well.  Questions encouraged and answered. Anticipate that he will need to be out of work approximately 4 weeks after the surgery.  Follow-Up Instructions: No follow-ups on file.   Orders:  No orders of the defined types were placed in this encounter.  No orders of the defined types were placed in this encounter.     Procedures: No procedures performed   Clinical Data: No additional findings.   Subjective: Chief Complaint  Patient presents with  . Right Wrist - Pain    Shane [name] is a 68 year old gentleman who is a Dr. Junius Roads referral for severe right carpal tunnel syndrome diagnosed nerve conduction study recently.  He states that he has had significant difficulty with dexterity and with buttoning buttons.  He feels like he is having to shake his hand out.  He is numb in the median nerve distribution.  He is not dropping things out of his hand.  He had a previous cortisone injection that helped for about 2 to 3 weeks.  He denies any injuries.  He does notice some muscle atrophy in his thenar compartment.   Review of Systems  Constitutional: Negative.   All other systems reviewed and are negative.    Objective: Vital Signs: There were no vitals taken for this visit.  Physical Exam Vitals and  nursing note reviewed.  Constitutional:      Appearance: He is well-developed.  HENT:     Head: Normocephalic and atraumatic.  Eyes:     Pupils: Pupils are equal, round, and reactive to light.  Pulmonary:     Effort: Pulmonary effort is normal.  Abdominal:     Palpations: Abdomen is soft.  Musculoskeletal:        General: Normal range of motion.     Cervical back: Neck supple.  Skin:    General: Skin is warm.  Neurological:     Mental Status: He is alert and oriented to person, place, and time.  Psychiatric:        Behavior: Behavior normal.        Thought Content: Thought content normal.        Judgment: Judgment normal.     Ortho Exam Right hand exam shows positive carpal tunnel compressive sites.  He does have mild flattening of the thenar compartment.  He has decreased sensation in the median nerve distribution.  Specialty Comments:  No specialty comments available.  Imaging: No results found.   PMFS History: Patient Active Problem List   Diagnosis Date Noted  . Right carpal tunnel syndrome 09/19/2018  . 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 Abnormality of gait 02/23/2018  . Degenerative cervical  disc 03/24/2016  . 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 Routine general medical examination at a health care facility 09/02/2015  . Numbness and tingling in hands 08/27/2015   Past Medical History:  Diagnosis Date  . Allergy   . Arthritis     Family History  Problem Relation Age of Onset  . Healthy Mother   . Hypertension Father   . Stroke Father     History reviewed. No pertinent surgical history. Social History   Occupational History  . Occupation: Armed forces technical officer  Tobacco Use  . Smoking status: Former Smoker    Packs/day: 1.00    Years: 10.00    Pack years: 10.00    Types: Cigarettes    Quit date: 08/09/1978    Years since quitting: 41.2  . Smokeless tobacco: Never Used  Substance and Sexual  Activity  . Alcohol use: No    Alcohol/week: 0.0 standard drinks  . Drug use: No  . Sexual activity: Not on file

## 2019-11-06 ENCOUNTER — Other Ambulatory Visit: Payer: Self-pay | Admitting: Physician Assistant

## 2019-11-06 DIAGNOSIS — G5601 Carpal tunnel syndrome, right upper limb: Secondary | ICD-10-CM | POA: Diagnosis not present

## 2019-11-06 MED ORDER — ONDANSETRON HCL 4 MG PO TABS: 4.0000 mg | ORAL_TABLET | Freq: Three times a day (TID) | ORAL | 0 refills | Status: DC | PRN

## 2019-11-06 MED ORDER — HYDROCODONE-ACETAMINOPHEN 5-325 MG PO TABS: 1.0000 | ORAL_TABLET | Freq: Three times a day (TID) | ORAL | 0 refills | Status: DC | PRN

## 2019-11-14 ENCOUNTER — Encounter: Payer: Self-pay | Admitting: Orthopaedic Surgery

## 2019-11-14 ENCOUNTER — Other Ambulatory Visit: Payer: Self-pay

## 2019-11-14 ENCOUNTER — Ambulatory Visit (INDEPENDENT_AMBULATORY_CARE_PROVIDER_SITE_OTHER): Payer: 59 | Admitting: Orthopaedic Surgery

## 2019-11-14 DIAGNOSIS — Z9889 Other specified postprocedural states: Secondary | ICD-10-CM

## 2019-11-14 MED ORDER — HYDROCODONE-ACETAMINOPHEN 5-325 MG PO TABS: 1.0000 | ORAL_TABLET | Freq: Four times a day (QID) | ORAL | 0 refills | Status: DC | PRN

## 2019-11-14 MED ORDER — HYDROCODONE-ACETAMINOPHEN 5-325 MG PO TABS: 1.0000 | ORAL_TABLET | Freq: Every day | ORAL | 0 refills | Status: DC | PRN

## 2019-11-14 NOTE — Progress Notes (Signed)
   Post-Op Visit Note   Patient: Shane [name]           Date of Birth: April 24, 1951           MRN: [id number] Visit Date: 11/14/2019 PCP: Marrian Salvage, FNP   Assessment & Plan:  Chief Complaint:  Chief Complaint  Patient presents with  . Right Hand - Pain   Visit Diagnoses:  1. S/P carpal tunnel release     Plan: Patient is a pleasant 69 year old gentleman who comes in today 1 week out right carpal tunnel release, date of surgery 11/06/2019.  Has been doing well.  He still has moderate incisional pain.  He has numbness and tingling throughout the median nerve distribution.  No fevers or chills.  Examination of his right wrist reveals a well-healing surgical incision with nylon sutures in place.  No evidence of infection or cellulitis.  He does have moderate swelling to the right hand.  At this point, it is too early to remove the sutures.  His hand was cleaned and a bandage applied.  He still has his removable splint at home which she will go into once he leaves his appointment today.  He will follow-up with [place] in 1 week for suture removal.  It was reinforced that there should be no heavy lifting or submerging the hand in water for a total of 4 weeks postop.  Call with concerns or questions in the meantime.  Follow-Up Instructions: Return in about 1 week (around 11/21/2019).   Orders:  No orders of the defined types were placed in this encounter.  Meds ordered this encounter  Medications  . DISCONTD: HYDROcodone-acetaminophen (NORCO) 5-325 MG tablet    Sig: Take 1 tablet by mouth every 6 (six) hours as needed for moderate pain.    Dispense:  90 tablet    Refill:  0  . HYDROcodone-acetaminophen (NORCO) 5-325 MG tablet    Sig: Take 1-2 tablets by mouth daily as needed for moderate pain.    Dispense:  20 tablet    Refill:  0    Imaging: No new imaging  PMFS History: Patient Active Problem List   Diagnosis Date Noted  . Right carpal tunnel syndrome 09/19/2018  .  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 Abnormality of gait 02/23/2018  . Degenerative cervical disc 03/24/2016  . 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 Routine general medical examination at a health care facility 09/02/2015  . Numbness and tingling in hands 08/27/2015   Past Medical History:  Diagnosis Date  . Allergy   . Arthritis     Family History  Problem Relation Age of Onset  . Healthy Mother   . Hypertension Father   . Stroke Father     History reviewed. No pertinent surgical history. Social History   Occupational History  . Occupation: Armed forces technical officer  Tobacco Use  . Smoking status: Former Smoker    Packs/day: 1.00    Years: 10.00    Pack years: 10.00    Types: Cigarettes    Quit date: 08/09/1978    Years since quitting: 41.2  . Smokeless tobacco: Never Used  Substance and Sexual Activity  . Alcohol use: No    Alcohol/week: 0.0 standard drinks  . Drug use: No  . Sexual activity: Not on file

## 2019-11-21 ENCOUNTER — Other Ambulatory Visit: Payer: Self-pay

## 2019-11-21 ENCOUNTER — Encounter: Payer: Self-pay | Admitting: Orthopaedic Surgery

## 2019-11-21 ENCOUNTER — Other Ambulatory Visit: Payer: Self-pay | Admitting: Family

## 2019-11-21 ENCOUNTER — Ambulatory Visit (INDEPENDENT_AMBULATORY_CARE_PROVIDER_SITE_OTHER): Payer: 59 | Admitting: Orthopaedic Surgery

## 2019-11-21 DIAGNOSIS — Z9889 Other specified postprocedural states: Secondary | ICD-10-CM

## 2019-11-21 MED ORDER — HYDROCODONE-ACETAMINOPHEN 5-325 MG PO TABS: 1.0000 | ORAL_TABLET | Freq: Every day | ORAL | 0 refills | Status: DC | PRN

## 2019-11-21 NOTE — Progress Notes (Signed)
   Post-Op Visit Note   Patient: Shane [name]           Date of Birth: [birth year]           MRN: [id number] Visit Date: 11/21/2019 PCP: Marrian Salvage, FNP   Assessment & Plan:  Chief Complaint:  Chief Complaint  Patient presents with  . Right Hand - Routine Post Op, Follow-up   Visit Diagnoses:  1. S/P carpal tunnel release     Plan: Mr. Devillier is 2 weeks status post right carpal tunnel release.  He is overall doing well.  He does endorse moderate swelling and is causing some numbness and tingling and burning.  He states that he has noticed an improvement overall since the surgery.  His surgical incision is healed without any signs of infection.  He does have moderate swelling.  He has some decreased sensation in the index and long fingers that is slightly improved compared to preop.  He is neurovascularly intact.  Today we remove the sutures and placed Bactroban ointment and Band-Aid.  I feel that he would benefit from a short course of hand therapy for edema control.  Hand rehab.  I do not feel that he is ready to go back to work just yet.  I would like to recheck him in another month.  Hydrocodone refill today.  Follow-Up Instructions: Return in about 4 weeks (around 12/19/2019).   Orders:  No orders of the defined types were placed in this encounter.  No orders of the defined types were placed in this encounter.   Imaging: No results found.  PMFS History: Patient Active Problem List   Diagnosis Date Noted  . Right carpal tunnel syndrome 09/19/2018  . 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 Abnormality of gait 02/23/2018  . Degenerative cervical disc 03/24/2016  . 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 Routine general medical examination at a health care facility 09/02/2015  . Numbness and tingling in hands 08/27/2015   Past Medical History:  Diagnosis Date  . Allergy   . Arthritis     Family History    Problem Relation Age of Onset  . Healthy Mother   . Hypertension Father   . Stroke Father     History reviewed. No pertinent surgical history. Social History   Occupational History  . Occupation: Armed forces technical officer  Tobacco Use  . Smoking status: Former Smoker    Packs/day: 1.00    Years: 10.00    Pack years: 10.00    Types: Cigarettes    Quit date: 08/09/1978    Years since quitting: 41.3  . Smokeless tobacco: Never Used  Substance and Sexual Activity  . Alcohol use: No    Alcohol/week: 0.0 standard drinks  . Drug use: No  . Sexual activity: Not on file

## 2019-11-24 ENCOUNTER — Telehealth: Payer: Self-pay | Admitting: Orthopaedic Surgery

## 2019-11-24 NOTE — Telephone Encounter (Signed)
11/21/19 ov note faxed to [facility] [phone]

## 2019-12-18 ENCOUNTER — Ambulatory Visit: Payer: 59 | Admitting: Family Medicine

## 2019-12-18 DIAGNOSIS — Z0289 Encounter for other administrative examinations: Secondary | ICD-10-CM

## 2019-12-18 NOTE — Progress Notes (Deleted)
[place] Phone: [phone] Subjective:   Shane [name], am serving as a scribe for Dr. Hulan Saas. This visit occurred during the SARS-CoV-2 public health emergency.  Safety protocols were in place, including screening questions prior to the visit, additional usage of staff PPE, and extensive cleaning of exam room while observing appropriate contact time as indicated for disinfecting solutions.   I'm seeing this patient by the request  of:  Shane Salvage, FNP  CC:   [id number]   10/23/2019 Bilateral injection given today, discussed icing regimen and home exercise, discussed which activities are doing which wants to avoid.  Patient is to increase activity slowly over the course the next several weeks.  Patient will follow up with me again in 4 to 8 weeks discussed with patient can repeat injections every 10 weeks if he wants to stick with the steroid injections.  Update 12/18/2019 Shane [name] is a 69 y.o. male coming in with complaint of bilateral knee pain. Patient states     Past Medical History:  Diagnosis Date  . Allergy   . Arthritis    [name] past surgical history on file. Social History   Socioeconomic History  . Marital status: Married    Spouse name: Not on file  . Number of children: 2  . Years of education: 49  . Highest education level: Not on file  Occupational History  . Occupation: Armed forces technical officer  Tobacco Use  . Smoking status: Former Smoker    Packs/day: 1.00    Years: 10.00    Pack years: 10.00    Types: Cigarettes    Quit date: 08/09/1978    Years since quitting: 41.3  . Smokeless tobacco: Never Used  Substance and Sexual Activity  . Alcohol use: [name]    Alcohol/week: 0.0 standard drinks  . Drug use: [name]  . Sexual activity: Not on file  Other Topics Concern  . 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 Difficulty of Paying Living Expenses: Not on file  Food Insecurity:   . Worried About Charity fundraiser in the Last Year: Not on file  . Ran Out of Food in the Last Year: Not on file  Transportation Needs:   . Lack of Transportation (Medical): Not on file  . Lack of Transportation (Non-Medical): Not on file  Physical Activity:   . Days of Exercise per Week: Not on file  . Minutes of Exercise per Session: Not on file  Stress:   . Feeling of Stress : Not on file  Social Connections:   . Frequency of Communication with Friends and Family: Not on file  . Frequency of Social Gatherings with Friends and Family: Not on file  . Attends Religious Services: Not on file  . Active Member of Clubs or Organizations: Not on file  . Attends Archivist Meetings: Not on file  . Marital Status: Not on file   [name] Known Allergies Family History  Problem Relation Age of Onset  . Healthy Mother   . Hypertension Father   . Stroke Father      Current Outpatient Medications (Cardiovascular):  .  pravastatin (PRAVACHOL) 20 MG tablet, TAKE 1 TABLET(20 MG) BY MOUTH DAILY   Current Outpatient Medications (Analgesics):  .  diclofenac (VOLTAREN) 75 MG EC  tablet, Take 1 tablet (75 mg total) by mouth 2 (two) times daily. Marland Kitchen  HYDROcodone-acetaminophen (NORCO) 5-325 MG tablet, Take 1-2 tablets by mouth daily as needed for moderate pain. Marland Kitchen  HYDROcodone-acetaminophen (NORCO) 5-325 MG tablet, Take 1-2 tablets by mouth daily as needed.   Current Outpatient Medications (Other):  Marland Kitchen  Diclofenac Sodium (PENNSAID) 2 % SOLN, Place 2 g onto the skin 2 (two) times daily. .  Diclofenac Sodium 2 % SOLN, Place 2 g onto the skin 2 (two) times daily. .  DULoxetine (CYMBALTA) 30 MG capsule, Take 1 capsule (30 mg total) by mouth daily. Marland Kitchen  gabapentin (NEURONTIN) 300 MG capsule, Take 1 capsule (300 mg total) by mouth 2 (two) times daily. .   ondansetron (ZOFRAN) 4 MG tablet, Take 1 tablet (4 mg total) by mouth every 8 (eight) hours as needed for nausea or vomiting. .  Vitamin D, Ergocalciferol, (DRISDOL) 1.25 MG (50000 UNIT) CAPS capsule, TAKE 1 CAPSULE BY MOUTH EVERY 7 DAYS FOR 12 DOSES   Reviewed prior external information including notes and imaging from  primary care provider As well as notes that were available from care everywhere and other healthcare systems.  Past medical history, social, surgical and family history all reviewed in electronic medical record.  [name] pertanent information unless stated regarding to the chief complaint.   Review of Systems:  [name] headache, visual changes, nausea, vomiting, diarrhea, constipation, dizziness, abdominal pain, skin rash, fevers, chills, night sweats, weight loss, swollen lymph nodes, body aches, joint swelling, chest pain, shortness of breath, mood changes. POSITIVE muscle aches  Objective  There were [name] vitals taken for this visit.   General: [name] apparent distress alert and oriented x3 mood and affect normal, dressed appropriately.  HEENT: Pupils equal, extraocular movements intact  Respiratory: Patient's speak in full sentences and does not appear short of breath  Cardiovascular: [name] lower extremity edema, non tender, [name] erythema  Skin: Warm dry intact with [name] signs of infection or rash on extremities or on axial skeleton.  Abdomen: Soft nontender  Neuro: Cranial nerves II through XII are intact, neurovascularly intact in all extremities with 2+ DTRs and 2+ pulses.  Lymph: [name] lymphadenopathy of posterior or anterior cervical chain or axillae bilaterally.  Gait normal with good balance and coordination.  MSK:  Non tender with full range of motion and good stability and symmetric strength and tone of shoulders, elbows, wrist, hip, knee and ankles bilaterally.     Impression and Recommendations:     This case required medical decision making of moderate complexity. The above  documentation has been reviewed and is accurate and complete Shane Pulley, DO       Note: This dictation was prepared with Dragon dictation along with smaller phrase technology. Any transcriptional errors that result from this process are unintentional.

## 2019-12-19 ENCOUNTER — Encounter: Payer: Self-pay | Admitting: Orthopaedic Surgery

## 2019-12-19 ENCOUNTER — Other Ambulatory Visit: Payer: Self-pay

## 2019-12-19 ENCOUNTER — Ambulatory Visit: Payer: 59 | Admitting: Physician Assistant

## 2019-12-19 DIAGNOSIS — Z9889 Other specified postprocedural states: Secondary | ICD-10-CM

## 2019-12-19 MED ORDER — HYDROCODONE-ACETAMINOPHEN 5-325 MG PO TABS: 1.0000 | ORAL_TABLET | Freq: Every day | ORAL | 0 refills | Status: DC | PRN

## 2019-12-19 NOTE — Progress Notes (Signed)
   Post-Op Visit Note   Patient: Shane [name]           Date of Birth: 08-Feb-1951           MRN: [id number] Visit Date: 12/19/2019 PCP: Marrian Salvage, FNP   Assessment & Plan:  Chief Complaint:  Chief Complaint  Patient presents with  . Right Hand - Pain   Visit Diagnoses:  1. S/P carpal tunnel release     Plan: Patient is a pleasant 69 year old gentleman who comes in today 6 weeks out right carpal tunnel release.  He has been in hand therapy making slow but steady progress.  He still admits to decreased sensation to the thumb, index and long fingers, but notes this is slowly improving.  He has moderate pain which is primarily at night.  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  Examination of his right hand reveals a fully healed surgical scar without complications.  He does have mild swelling.  Decreased sensation to the thumb, index and long fingers.  At this point, we will have him continue with occupational therapy.  We will write him out of work for another 6 weeks.  He will follow-up with [place] in 6 weeks time for repeat evaluation.  Should he feel as though his symptoms improve enough to return to work sooner, he will call us and let us know.  Follow-Up Instructions: Return in about 6 weeks (around 01/30/2020).   Orders:  No orders of the defined types were placed in this encounter.  Meds ordered this encounter  Medications  . HYDROcodone-acetaminophen (NORCO) 5-325 MG tablet    Sig: Take 1-2 tablets by mouth daily as needed for moderate pain.    Dispense:  20 tablet    Refill:  0    Imaging: No new imaging  PMFS History: Patient Active Problem List   Diagnosis Date Noted  . Right carpal tunnel syndrome 09/19/2018  . 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 Abnormality of gait 02/23/2018    . Degenerative cervical disc 03/24/2016  . 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 Routine general medical examination at a health care facility 09/02/2015  . Numbness and tingling in hands 08/27/2015   Past Medical History:  Diagnosis Date  . Allergy   . Arthritis     Family History  Problem Relation Age of Onset  . Healthy Mother   . Hypertension Father   . Stroke Father     History reviewed. No pertinent surgical history. Social History   Occupational History  . Occupation: Armed forces technical officer  Tobacco Use  . Smoking status: Former Smoker    Packs/day: 1.00    Years: 10.00    Pack years: 10.00    Types: Cigarettes    Quit date: 08/09/1978    Years since quitting: 41.3  . Smokeless tobacco: Never Used  Substance and Sexual Activity  . Alcohol use: No    Alcohol/week: 0.0 standard drinks  . Drug use: No  . Sexual activity: Not on file

## 2019-12-22 ENCOUNTER — Telehealth: Payer: Self-pay | Admitting: Orthopaedic Surgery

## 2019-12-22 NOTE — Telephone Encounter (Signed)
12/19/19 ov note& work note faxed to [facility] [phone]

## 2020-01-30 ENCOUNTER — Other Ambulatory Visit: Payer: Self-pay

## 2020-01-30 ENCOUNTER — Encounter: Payer: Self-pay | Admitting: Orthopaedic Surgery

## 2020-01-30 ENCOUNTER — Ambulatory Visit: Payer: 59 | Admitting: Orthopaedic Surgery

## 2020-01-30 VITALS — Ht 71.0 in | Wt 208.0 lb

## 2020-01-30 DIAGNOSIS — Z9889 Other specified postprocedural states: Secondary | ICD-10-CM

## 2020-01-30 MED ORDER — NAPROXEN 500 MG PO TABS: 500.0000 mg | ORAL_TABLET | Freq: Two times a day (BID) | ORAL | 3 refills | Status: DC

## 2020-01-30 NOTE — Progress Notes (Signed)
   Post-Op Visit Note   Patient: Shane [name]           Date of Birth: [birth year]           MRN: [id number] Visit Date: 01/30/2020 PCP: Marrian Salvage, FNP   Assessment & Plan:  Chief Complaint:  Chief Complaint  Patient presents with  . Right Wrist - Follow-up   Visit Diagnoses:  1. S/P carpal tunnel release     Plan: Mr. Kollman is 12 weeks status post right carpal tunnel release.  He still has numbness in his thumb index and long finger but this has improved slightly.  He still has dysesthesias in the fingertips and he has plateaued with hand therapy. Surgical scar is fully healed.  He has thenar atrophy.  I reviewed the hand therapy notes which have recommended a discharge to home exercises based on the plateauing.  At this point do recommend that he continue with home exercises.  Given the type of work that he does which requires a lot of fine motor skills and sensation in the fingertips I do not feel that he is safe to return back to work quite yet.  We will take him out of work for another 4 weeks and then reevaluate.  He will let us know if we need to extend it.  We did briefly review the nerve conduction studies which are severe and showed axonal injury so he understands that his prognosis is now going to be full recovery.  Follow-Up Instructions: Return if symptoms worsen or fail to improve.   Orders:  No orders of the defined types were placed in this encounter.  Meds ordered this encounter  Medications  . naproxen (NAPROSYN) 500 MG tablet    Sig: Take 1 tablet (500 mg total) by mouth 2 (two) times daily with a meal.    Dispense:  30 tablet    Refill:  3    Imaging: No results found.  PMFS History: Patient Active Problem List   Diagnosis Date Noted  . S/P carpal tunnel release 01/30/2020  . Right carpal tunnel syndrome 09/19/2018  . 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 Abnormality of gait 02/23/2018  . Degenerative  cervical disc 03/24/2016  . 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 Routine general medical examination at a health care facility 09/02/2015  . Numbness and tingling in hands 08/27/2015   Past Medical History:  Diagnosis Date  . Allergy   . Arthritis     Family History  Problem Relation Age of Onset  . Healthy Mother   . Hypertension Father   . Stroke Father     History reviewed. No pertinent surgical history. Social History   Occupational History  . Occupation: Armed forces technical officer  Tobacco Use  . Smoking status: Former Smoker    Packs/day: 1.00    Years: 10.00    Pack years: 10.00    Types: Cigarettes    Quit date: 08/09/1978    Years since quitting: 41.5  . Smokeless tobacco: Never Used  Substance and Sexual Activity  . Alcohol use: No    Alcohol/week: 0.0 standard drinks  . Drug use: No  . Sexual activity: Not on file

## 2020-02-10 ENCOUNTER — Other Ambulatory Visit: Payer: Self-pay | Admitting: Family

## 2020-02-20 ENCOUNTER — Encounter: Payer: Self-pay | Admitting: Orthopaedic Surgery

## 2020-02-20 ENCOUNTER — Ambulatory Visit: Payer: 59 | Admitting: Orthopaedic Surgery

## 2020-02-20 ENCOUNTER — Other Ambulatory Visit: Payer: Self-pay

## 2020-02-20 DIAGNOSIS — G5601 Carpal tunnel syndrome, right upper limb: Secondary | ICD-10-CM | POA: Diagnosis not present

## 2020-02-20 DIAGNOSIS — Z9889 Other specified postprocedural states: Secondary | ICD-10-CM

## 2020-02-20 NOTE — Progress Notes (Signed)
   Post-Op Visit Note   Patient: Shane [name]           Date of Birth: 1950/11/30           MRN: [id number] Visit Date: 02/20/2020 PCP: Marrian Salvage, FNP   Assessment & Plan:  Chief Complaint:  Chief Complaint  Patient presents with  . Right Hand - Follow-up   Visit Diagnoses:  1. S/P carpal tunnel release   2. Right carpal tunnel syndrome     Plan: Patient is a pleasant 69 year old gentleman who comes in today approximately 3-1/2 months status post right carpal tunnel release date of surgery 11/06/2019.  He has been doing okay.  Nerve conduction studies prior to surgery showed a severe median nerve compression.  He has struggled to regain full sensation and strength following surgery.  He is still having numbness, tingling and burning to the median nerve distribution but this is continuing to slowly improve.  He has noticed that he has continued to regain more strength over the past few weeks as well.  He is working on a home exercise program daily.  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  Examination of his right hand reveals near full sensation to the thenar eminence.  He still has decreased sensation to the thumb, index and long fingers.  He has some thenar atrophy as well.  Decreased grip strength.  At this point, he will continue working on his home exercise program.  We will continue his short-term disability for another 6 weeks.  He will follow-up with [place] at that point for recheck.  Call with concerns or questions in the meantime.  Follow-Up Instructions: Return in about 6 weeks (around 04/02/2020).   Orders:  No orders of the defined types were placed in this encounter.  No orders of the defined types were placed in this encounter.   Imaging: No new imaging  PMFS History: Patient Active Problem List   Diagnosis Date Noted  . S/P carpal tunnel  release 01/30/2020  . Right carpal tunnel syndrome 09/19/2018  . 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 Abnormality of gait 02/23/2018  . Degenerative cervical disc 03/24/2016  . 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 Routine general medical examination at a health care facility 09/02/2015  . Numbness and tingling in hands 08/27/2015   Past Medical History:  Diagnosis Date  . Allergy   . Arthritis     Family History  Problem Relation Age of Onset  . Healthy Mother   . Hypertension Father   . Stroke Father     History reviewed. No pertinent surgical history. Social History   Occupational History  . Occupation: Armed forces technical officer  Tobacco Use  . Smoking status: Former Smoker    Packs/day: 1.00    Years: 10.00    Pack years: 10.00    Types: Cigarettes    Quit date: 08/09/1978    Years since quitting: 41.5  . Smokeless tobacco: Never Used  Substance and Sexual Activity  . Alcohol use: No    Alcohol/week: 0.0 standard drinks  . Drug use: No  . Sexual activity: Not on file

## 2020-03-08 ENCOUNTER — Telehealth: Payer: Self-pay | Admitting: Orthopaedic Surgery

## 2020-03-08 NOTE — Telephone Encounter (Signed)
Patient called stating needs 4/13 ov note faxed to [facility], that they've cut his disability. I advised him the 4/13 wasn't faxed because it was marked blocked for privacy and we need auth to fax. He is coming in today to sign authorization and I accepted verbal and faxed the ov note to [facility] [phone], claim# [id number]

## 2020-04-02 ENCOUNTER — Other Ambulatory Visit: Payer: Self-pay

## 2020-04-02 ENCOUNTER — Encounter: Payer: Self-pay | Admitting: Orthopaedic Surgery

## 2020-04-02 ENCOUNTER — Ambulatory Visit: Payer: 59 | Admitting: Orthopaedic Surgery

## 2020-04-02 VITALS — Ht 71.0 in | Wt 208.0 lb

## 2020-04-02 DIAGNOSIS — Z9889 Other specified postprocedural states: Secondary | ICD-10-CM

## 2020-04-02 NOTE — Progress Notes (Signed)
Office Visit Note   Patient: Shane [name]           Date of Birth: 1951/05/28           MRN: [id number] Visit Date: 04/02/2020              Requested by: Marrian Salvage, [address] [place],  [place] [address] PCP: Marrian Salvage, FNP   Assessment & Plan: Visit Diagnoses:  1. S/P carpal tunnel release     Plan: I reviewed the nerve conduction studies which did show severe carpal tunnel syndrome with axonal injury and demyelination.  Reviewed these findings with the patient in detail.  He is unlikely going to regain full sensation due to the severity of the nerve injury.  He is currently not safe to return back to work given the requirements of his job.  I would like to give it another 6 months for him to reach [place] so that we can perform a rate and release.  Work note provided for another 6 months out of work.  He has been instructed to follow-up sooner should he regain enough sensation to where he feels comfortable returning back to work.  All questions answered to his satisfaction.  Follow-up in 6 months.  Follow-Up Instructions: Return in about 6 months (around 10/03/2020).   Orders:  No orders of the defined types were placed in this encounter.  No orders of the defined types were placed in this encounter.     Procedures: No procedures performed   Clinical Data: No additional findings.   Subjective: Chief Complaint  Patient presents with  . Right Wrist - Follow-up    Right carpal tunnel release 11/06/2019    [name] is 5 months status post right carpal tunnel release.  He has noticed increased tingling in the thumb index and middle finger.  He still does not have normal sensation.   Review of Systems  Constitutional: Negative.   All other systems reviewed and are negative.    Objective: Vital Signs: Ht 5\' 11"  (1.803 m)   Wt 208 lb (94.3 kg)   BMI 29.01 kg/m   Physical Exam Vitals and nursing note reviewed.  Constitutional:       Appearance: He is well-developed.  Pulmonary:     Effort: Pulmonary effort is normal.  Abdominal:     Palpations: Abdomen is soft.  Skin:    General: Skin is warm.  Neurological:     Mental Status: He is alert and oriented to person, place, and time.  Psychiatric:        Behavior: Behavior normal.        Thought Content: Thought content normal.        Judgment: Judgment normal.     Ortho Exam Right hand exam is stable.  Decreased sensation in the median nerve distribution.  Surgical scar is fully healed. Specialty Comments:  No specialty comments available.  Imaging: No results found.   PMFS History: Patient Active Problem List   Diagnosis Date Noted  . S/P carpal tunnel release 01/30/2020  . Right carpal tunnel syndrome 09/19/2018  . 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 Abnormality of gait 02/23/2018  . Degenerative cervical disc 03/24/2016  . 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 Routine general medical examination at a health care facility 09/02/2015  . Numbness and tingling in hands 08/27/2015   Past Medical History:  Diagnosis Date  . Allergy   . Arthritis  Family History  Problem Relation Age of Onset  . Healthy Mother   . Hypertension Father   . Stroke Father     History reviewed. No pertinent surgical history. Social History   Occupational History  . Occupation: Armed forces technical officer  Tobacco Use  . Smoking status: Former Smoker    Packs/day: 1.00    Years: 10.00    Pack years: 10.00    Types: Cigarettes    Quit date: 08/09/1978    Years since quitting: 41.6  . Smokeless tobacco: Never Used  Substance and Sexual Activity  . Alcohol use: No    Alcohol/week: 0.0 standard drinks  . Drug use: No  . Sexual activity: Not on file

## 2020-04-16 ENCOUNTER — Telehealth: Payer: Self-pay | Admitting: Orthopaedic Surgery

## 2020-04-16 NOTE — Telephone Encounter (Signed)
Received call from patient needing 04/02/2020 ov note faxed to [employer] for his Long Term disability. I faxed [phone]

## 2020-04-24 ENCOUNTER — Telehealth: Payer: Self-pay

## 2020-04-24 NOTE — Telephone Encounter (Signed)
Dr Sherrlyn Hock called in to speak about patients work conditions.   Best Contact # [phone]

## 2020-04-25 ENCOUNTER — Telehealth: Payer: Self-pay | Admitting: Orthopaedic Surgery

## 2020-04-25 NOTE — Telephone Encounter (Signed)
Dr. Sherrlyn Hock called. He is with [employer]. He would like to know the work restrictions for Shane [name]. His call back number is [phone]

## 2020-04-25 NOTE — Telephone Encounter (Signed)
Left voicemail to call us back

## 2020-04-25 NOTE — Telephone Encounter (Signed)
See below

## 2020-05-06 ENCOUNTER — Other Ambulatory Visit: Payer: Self-pay | Admitting: Family

## 2020-05-06 ENCOUNTER — Encounter: Payer: Self-pay | Admitting: Family

## 2020-05-06 DIAGNOSIS — E559 Vitamin D deficiency, unspecified: Secondary | ICD-10-CM

## 2020-05-06 NOTE — Progress Notes (Signed)
lw 

## 2020-05-17 ENCOUNTER — Other Ambulatory Visit: Payer: Self-pay | Admitting: Family

## 2020-05-24 ENCOUNTER — Other Ambulatory Visit (INDEPENDENT_AMBULATORY_CARE_PROVIDER_SITE_OTHER): Payer: 59

## 2020-05-24 DIAGNOSIS — E559 Vitamin D deficiency, unspecified: Secondary | ICD-10-CM

## 2020-05-24 LAB — VITAMIN D 25 HYDROXY (VIT D DEFICIENCY, FRACTURES): VITD: 43.11 ng/mL (ref 30.00–100.00)

## 2020-06-06 ENCOUNTER — Other Ambulatory Visit: Payer: Self-pay

## 2020-06-06 ENCOUNTER — Encounter: Payer: Self-pay | Admitting: Family Medicine

## 2020-06-06 DIAGNOSIS — M170 Bilateral primary osteoarthritis of knee: Secondary | ICD-10-CM

## 2020-06-06 MED ORDER — DICLOFENAC SODIUM 75 MG PO TBEC: 75.0000 mg | DELAYED_RELEASE_TABLET | Freq: Two times a day (BID) | ORAL | 1 refills | Status: DC

## 2020-06-17 ENCOUNTER — Encounter: Payer: Self-pay | Admitting: Family

## 2020-06-17 ENCOUNTER — Other Ambulatory Visit: Payer: Self-pay | Admitting: Family

## 2020-06-17 MED ORDER — HYDROCORTISONE (PERIANAL) 2.5 % EX CREA: 1.0000 "application " | TOPICAL_CREAM | Freq: Two times a day (BID) | CUTANEOUS | 0 refills | Status: DC

## 2020-06-24 ENCOUNTER — Encounter: Payer: Self-pay | Admitting: Family

## 2020-06-24 ENCOUNTER — Other Ambulatory Visit: Payer: Self-pay

## 2020-06-24 ENCOUNTER — Ambulatory Visit: Payer: 59 | Admitting: Family

## 2020-06-24 VITALS — BP 144/90 | HR 84 | Temp 98.1°F | Ht 71.0 in | Wt 194.0 lb

## 2020-06-24 DIAGNOSIS — R030 Elevated blood-pressure reading, without diagnosis of hypertension: Secondary | ICD-10-CM

## 2020-06-24 DIAGNOSIS — R634 Abnormal weight loss: Secondary | ICD-10-CM

## 2020-06-24 DIAGNOSIS — R197 Diarrhea, unspecified: Secondary | ICD-10-CM | POA: Diagnosis not present

## 2020-06-24 LAB — CBC WITH DIFFERENTIAL/PLATELET
Absolute Monocytes: 784 cells/uL (ref 200–950)
Basophils Absolute: 147 cells/uL (ref 0–200)
Basophils Relative: 2.1 %
Eosinophils Absolute: 252 cells/uL (ref 15–500)
Eosinophils Relative: 3.6 %
HCT: 32.8 % — ABNORMAL LOW (ref 38.5–50.0)
Hemoglobin: 9.7 g/dL — ABNORMAL LOW (ref 13.2–17.1)
Lymphs Abs: 1008 cells/uL (ref 850–3900)
MCH: 22.1 pg — ABNORMAL LOW (ref 27.0–33.0)
MCHC: 29.6 g/dL — ABNORMAL LOW (ref 32.0–36.0)
MCV: 74.7 fL — ABNORMAL LOW (ref 80.0–100.0)
MPV: 10.2 fL (ref 7.5–12.5)
Monocytes Relative: 11.2 %
Neutro Abs: 4809 cells/uL (ref 1500–7800)
Neutrophils Relative %: 68.7 %
Platelets: 512 10*3/uL — ABNORMAL HIGH (ref 140–400)
RBC: 4.39 10*6/uL (ref 4.20–5.80)
RDW: 16.8 % — ABNORMAL HIGH (ref 11.0–15.0)
Total Lymphocyte: 14.4 %
WBC: 7 10*3/uL (ref 3.8–10.8)

## 2020-06-24 LAB — COMPREHENSIVE METABOLIC PANEL
AG Ratio: 1 (calc) (ref 1.0–2.5)
ALT: 15 U/L (ref 9–46)
AST: 16 U/L (ref 10–35)
Albumin: 3.7 g/dL (ref 3.6–5.1)
Alkaline phosphatase (APISO): 92 U/L (ref 35–144)
BUN: 19 mg/dL (ref 7–25)
CO2: 25 mmol/L (ref 20–32)
Calcium: 9.7 mg/dL (ref 8.6–10.3)
Chloride: 104 mmol/L (ref 98–110)
Creat: 0.97 mg/dL (ref 0.70–1.18)
Globulin: 3.6 g/dL (calc) (ref 1.9–3.7)
Glucose, Bld: 87 mg/dL (ref 65–99)
Potassium: 4.5 mmol/L (ref 3.5–5.3)
Sodium: 136 mmol/L (ref 135–146)
Total Bilirubin: 0.5 mg/dL (ref 0.2–1.2)
Total Protein: 7.3 g/dL (ref 6.1–8.1)

## 2020-06-24 LAB — LIPASE: Lipase: 23 U/L (ref 7–60)

## 2020-06-24 LAB — AMYLASE: Amylase: 68 U/L (ref 21–101)

## 2020-06-24 NOTE — Patient Instructions (Signed)
Food Choices to Help Relieve Diarrhea, Adult When you have diarrhea, the foods you eat and your eating habits are very important. Choosing the right foods and drinks can help:  Relieve diarrhea.  Replace lost fluids and nutrients.  Prevent dehydration. What general guidelines should I follow?  Relieving diarrhea  Choose foods with less than 2 g or .07 oz. of fiber per serving.  Limit fats to less than 8 tsp (38 g or 1.34 oz.) a day.  Avoid the following: ? Foods and beverages sweetened with high-fructose corn syrup, honey, or sugar alcohols such as xylitol, sorbitol, and mannitol. ? Foods that contain a lot of fat or sugar. ? Fried, greasy, or spicy foods. ? High-fiber grains, breads, and cereals. ? Raw fruits and vegetables.  Eat foods that are rich in probiotics. These foods include dairy products such as yogurt and fermented milk products. They help increase healthy bacteria in the stomach and intestines (gastrointestinal tract, or GI tract).  If you have lactose intolerance, avoid dairy products. These may make your diarrhea worse.  Take medicine to help stop diarrhea (antidiarrheal medicine) only as told by your health care provider. Replacing nutrients  Eat small meals or snacks every 3-4 hours.  Eat bland foods, such as white rice, toast, or baked potato, until your diarrhea starts to get better. Gradually reintroduce nutrient-rich foods as tolerated or as told by your health care provider. This includes: ? Well-cooked protein foods. ? Peeled, seeded, and soft-cooked fruits and vegetables. ? Low-fat dairy products.  Take vitamin and mineral supplements as told by your health care provider. Preventing dehydration  Start by sipping water or a special solution to prevent dehydration (oral rehydration solution, ORS). Urine that is clear or pale yellow means that you are getting enough fluid.  Try to drink at least 8-10 cups of fluid each day to help replace lost  fluids.  You may add other liquids in addition to water, such as clear juice or decaffeinated sports drinks, as tolerated or as told by your health care provider.  Avoid drinks with caffeine, such as coffee, tea, or soft drinks.  Avoid alcohol. What foods are recommended?     The items listed may not be a complete list. Talk with your health care provider about what dietary choices are best for you. Grains White rice. White, French, or pita breads (fresh or toasted), including plain rolls, buns, or bagels. White pasta. Saltine, soda, or graham crackers. Pretzels. Low-fiber cereal. Cooked cereals made with water (such as cornmeal, farina, or cream cereals). Plain muffins. Matzo. Melba toast. Zwieback. Vegetables Potatoes (without the skin). Most well-cooked and canned vegetables without skins or seeds. Tender lettuce. Fruits Apple sauce. Fruits canned in juice. Cooked apricots, cherries, grapefruit, peaches, pears, or plums. Fresh bananas and cantaloupe. Meats and other protein foods Baked or boiled chicken. Eggs. Tofu. Fish. Seafood. Smooth nut butters. Ground or well-cooked tender beef, ham, veal, lamb, pork, or poultry. Dairy Plain yogurt, kefir, and unsweetened liquid yogurt. Lactose-free milk, buttermilk, skim milk, or soy milk. Low-fat or nonfat hard cheese. Beverages Water. Low-calorie sports drinks. Fruit juices without pulp. Strained tomato and vegetable juices. Decaffeinated teas. Sugar-free beverages not sweetened with sugar alcohols. Oral rehydration solutions, if approved by your health care provider. Seasoning and other foods Bouillon, broth, or soups made from recommended foods. What foods are not recommended? The items listed may not be a complete list. Talk with your health care provider about what dietary choices are best for you. Grains Whole   grain, whole wheat, bran, or rye breads, rolls, pastas, and crackers. Wild or brown rice. Whole grain or bran cereals. Barley.  Oats and oatmeal. Corn tortillas or taco shells. Granola. Popcorn. Vegetables Raw vegetables. Fried vegetables. Cabbage, broccoli, Brussels sprouts, artichokes, baked beans, beet greens, corn, kale, legumes, peas, sweet potatoes, and yams. Potato skins. Cooked spinach and cabbage. Fruits Dried fruit, including raisins and dates. Raw fruits. Stewed or dried prunes. Canned fruits with syrup. Meat and other protein foods Fried or fatty meats. Deli meats. Chunky nut butters. Nuts and seeds. Beans and lentils. Bacon. Hot dogs. Sausage. Dairy High-fat cheeses. Whole milk, chocolate milk, and beverages made with milk, such as milk shakes. Half-and-half. Cream. sour cream. Ice cream. Beverages Caffeinated beverages (such as coffee, tea, soda, or energy drinks). Alcoholic beverages. Fruit juices with pulp. Prune juice. Soft drinks sweetened with high-fructose corn syrup or sugar alcohols. High-calorie sports drinks. Fats and oils Butter. Cream sauces. Margarine. Salad oils. Plain salad dressings. Olives. Avocados. Mayonnaise. Sweets and desserts Sweet rolls, doughnuts, and sweet breads. Sugar-free desserts sweetened with sugar alcohols such as xylitol and sorbitol. Seasoning and other foods Honey. Hot sauce. Chili powder. Gravy. Cream-based or milk-based soups. Pancakes and waffles. Summary  When you have diarrhea, the foods you eat and your eating habits are very important.  Make sure you get at least 8-10 cups of fluid each day, or enough to keep your urine clear or pale yellow.  Eat bland foods and gradually reintroduce healthy, nutrient-rich foods as tolerated, or as told by your health care provider.  Avoid high-fiber, fried, greasy, or spicy foods. This information is not intended to replace advice given to you by your health care provider. Make sure you discuss any questions you have with your health care provider. Document Revised: 02/16/2019 Document Reviewed: 10/23/2016 Elsevier Patient  Education  2020 Elsevier Inc.  

## 2020-06-24 NOTE — Progress Notes (Signed)
°Shane [name] is a 68 y.o. male with the following history as recorded in Epic[facility]:  °Patient Active Problem List  ° Diagnosis Date Noted  °• S/P carpal tunnel release 01/30/2020  °• Right carpal tunnel syndrome 09/19/2018  °• Osteoarthritis of spine with radiculopathy, cervical region 09/05/2018  °• Abnormality of gait 02/23/2018  °• Degenerative cervical disc 03/24/2016  °• Degenerative arthritis of knee, bilateral 10/24/2015  °• Routine general medical examination at a health care facility 09/02/2015  °• Numbness and tingling in hands 08/27/2015  °  °Current Outpatient Medications  °Medication Sig Dispense Refill  °• diclofenac (VOLTAREN) 75 MG EC tablet Take 1 tablet (75 mg total) by mouth 2 (two) times daily. 180 tablet 1  °• Diclofenac Sodium (PENNSAID) 2 % SOLN Place 2 g onto the skin 2 (two) times daily. 112 g 3  °• Diclofenac Sodium 2 % SOLN Place 2 g onto the skin 2 (two) times daily. 112 g 3  °• DULoxetine (CYMBALTA) 30 MG capsule Take 1 capsule (30 mg total) by mouth daily. 30 capsule 3  °• gabapentin (NEURONTIN) 300 MG capsule Take 1 capsule (300 mg total) by mouth 2 (two) times daily. 120 capsule 1  °• HYDROcodone-acetaminophen (NORCO) 5-325 MG tablet Take 1-2 tablets by mouth daily as needed for moderate pain. 20 tablet 0  °• hydrocortisone (ANUSOL-HC) 2.5 % rectal cream Place 1 application rectally 2 (two) times daily. 30 g 0  °• naproxen (NAPROSYN) 500 MG tablet Take 1 tablet (500 mg total) by mouth 2 (two) times daily with a meal. 30 tablet 3  °• ondansetron (ZOFRAN) 4 MG tablet Take 1 tablet (4 mg total) by mouth every 8 (eight) hours as needed for nausea or vomiting. 40 tablet 0  °• pravastatin (PRAVACHOL) 20 MG tablet TAKE 1 TABLET(20 MG) BY MOUTH DAILY 90 tablet 3  °• Vitamin D, Ergocalciferol, (DRISDOL) 1.25 MG (50000 UNIT) CAPS capsule TAKE 1 CAPSULE BY MOUTH EVERY 7 DAYS FOR 12 DOSES 12 capsule 0  ° °No current facility-administered medications for this visit.  °  °Allergies: Patient has  no known allergies.  °Past Medical History:  °Diagnosis Date  °• Allergy   °• Arthritis   °  °No past surgical history on file.  °Family History  °Problem Relation Age of Onset  °• Healthy Mother   °• Hypertension Father   °• Stroke Father   °  °Social History  ° °Tobacco Use  °• Smoking status: Former Smoker  °  Packs/day: 1.00  °  Years: 10.00  °  Pack years: 10.00  °  Types: Cigarettes  °  Quit date: 08/09/1978  °  Years since quitting: 41.9  °• Smokeless tobacco: Never Used  °Substance Use Topics  °• Alcohol use: No  °  Alcohol/week: 0.0 standard drinks  °  °Subjective:  °Presents with multiple concerns: ° °1) Has been checking his blood pressure more recently; is concerned that is elevated; averaging 150/80; using a wrist cuff- does not have in the office today; Denies any chest pain, shortness of breath, blurred vision or headache; admits not sleeping as we recently; °2) Has lost weight recently- per patient he is not eating as much since he has retired; is also not as active; °3) Had diarrhea last week- no blood in diarrhea; no abdominal pain; no family members with similar symptoms;  ° ° °Objective:  °Vitals:  ° 06/24/20 1334  °BP: (!) 144/90  °Pulse: 84  °Temp: 98.1 °F (36.7 °C)  °TempSrc: Oral  °  SpO2: 98%  °Weight: 194 lb (88 kg)  °Height: 5' 11" (1.803 m)  °  °General: Well developed, well nourished, in no acute distress  °Skin : Warm and dry.  °Head: Normocephalic and atraumatic  °Lungs: Respirations unlabored; clear to auscultation bilaterally without wheeze, rales, rhonchi  °CVS exam: normal rate and regular rhythm.  °Abdomen: Soft; nontender; nondistended; normoactive bowel sounds; no masses or hepatosplenomegaly  °Musculoskeletal: No deformities; no active joint inflammation  °Extremities: No edema, cyanosis, clubbing  °Vessels: Symmetric bilaterally  °Neurologic: Alert and oriented; speech intact; face symmetrical; moves all extremities well; CNII-XII intact without focal deficit °Assessment:  °1.  Diarrhea, unspecified type   °2. Elevated blood pressure reading   °3. Weight loss   °  °Plan:  °1. Will update labs today; dietary changes discussed; okay to use Imodium for the next 24 hours; follow up to be determined; °2. Start checking his pressure regularly and bring log/cuff to next appointment; recommend to use arm cuff rather than wrist cuff; °3. Per patient, he has not been eating as much since he quit working; will re-check in 1 month; ° °This visit occurred during the SARS-CoV-2 public health emergency.  Safety protocols were in place, including screening questions prior to the visit, additional usage of staff PPE, and extensive cleaning of exam room while observing appropriate contact time as indicated for disinfecting solutions.  ° ° ° °No follow-ups on file.  °Orders Placed This Encounter  °Procedures  °• CBC with Differential/Platelet  °  Standing Status:   Future  °  Number of Occurrences:   1  °  Standing Expiration Date:   06/24/2021  °• Comp Met (CMET)  °  Standing Status:   Future  °  Number of Occurrences:   1  °  Standing Expiration Date:   06/24/2021  °• Amylase  °  Standing Status:   Future  °  Number of Occurrences:   1  °  Standing Expiration Date:   06/24/2021  °• Lipase  °  Standing Status:   Future  °  Number of Occurrences:   1  °  Standing Expiration Date:   06/24/2021  °  °Requested Prescriptions  ° ° No prescriptions requested or ordered in this encounter  °  ° °

## 2020-06-25 ENCOUNTER — Other Ambulatory Visit: Payer: Self-pay | Admitting: Family

## 2020-06-25 ENCOUNTER — Other Ambulatory Visit (INDEPENDENT_AMBULATORY_CARE_PROVIDER_SITE_OTHER): Payer: 59

## 2020-06-25 DIAGNOSIS — D649 Anemia, unspecified: Secondary | ICD-10-CM

## 2020-06-25 LAB — CBC WITH DIFFERENTIAL/PLATELET
Basophils Absolute: 0.2 10*3/uL — ABNORMAL HIGH (ref 0.0–0.1)
Basophils Relative: 2.5 % (ref 0.0–3.0)
Eosinophils Absolute: 0.3 10*3/uL (ref 0.0–0.7)
Eosinophils Relative: 3.3 % (ref 0.0–5.0)
HCT: 29.8 % — ABNORMAL LOW (ref 39.0–52.0)
Hemoglobin: 9.5 g/dL — ABNORMAL LOW (ref 13.0–17.0)
Lymphocytes Relative: 14 % (ref 12.0–46.0)
Lymphs Abs: 1.2 10*3/uL (ref 0.7–4.0)
MCHC: 31.9 g/dL (ref 30.0–36.0)
MCV: 70.2 fl — ABNORMAL LOW (ref 78.0–100.0)
Monocytes Absolute: 1 10*3/uL (ref 0.1–1.0)
Monocytes Relative: 11.6 % (ref 3.0–12.0)
Neutro Abs: 5.8 10*3/uL (ref 1.4–7.7)
Neutrophils Relative %: 68.6 % (ref 43.0–77.0)
Platelets: 465 10*3/uL — ABNORMAL HIGH (ref 150.0–400.0)
RBC: 4.24 Mil/uL (ref 4.22–5.81)
RDW: 18.4 % — ABNORMAL HIGH (ref 11.5–15.5)
WBC: 8.5 10*3/uL (ref 4.0–10.5)

## 2020-06-25 LAB — FERRITIN: Ferritin: 12.7 ng/mL — ABNORMAL LOW (ref 22.0–322.0)

## 2020-06-25 NOTE — Addendum Note (Signed)
Addended by: Trenda Moots on: 8/59/9234 01:30 PM   Modules accepted: Orders

## 2020-06-26 ENCOUNTER — Other Ambulatory Visit: Payer: Self-pay | Admitting: Family

## 2020-06-26 DIAGNOSIS — R634 Abnormal weight loss: Secondary | ICD-10-CM

## 2020-06-26 DIAGNOSIS — D509 Iron deficiency anemia, unspecified: Secondary | ICD-10-CM

## 2020-06-26 DIAGNOSIS — R197 Diarrhea, unspecified: Secondary | ICD-10-CM

## 2020-06-27 ENCOUNTER — Other Ambulatory Visit: Payer: Self-pay

## 2020-06-27 ENCOUNTER — Ambulatory Visit (INDEPENDENT_AMBULATORY_CARE_PROVIDER_SITE_OTHER)
Admission: RE | Admit: 2020-06-27 | Discharge: 2020-06-27 | Disposition: A | Discharge status: Home / Self Care | Payer: 59 | Source: Ambulatory Visit | Attending: Family | Admitting: Family

## 2020-06-27 ENCOUNTER — Telehealth: Payer: Self-pay | Admitting: Internal Medicine

## 2020-06-27 DIAGNOSIS — D509 Iron deficiency anemia, unspecified: Secondary | ICD-10-CM

## 2020-06-27 DIAGNOSIS — R197 Diarrhea, unspecified: Secondary | ICD-10-CM

## 2020-06-27 DIAGNOSIS — R634 Abnormal weight loss: Secondary | ICD-10-CM | POA: Diagnosis not present

## 2020-06-27 DIAGNOSIS — K6289 Other specified diseases of anus and rectum: Secondary | ICD-10-CM

## 2020-06-27 MED ORDER — IOHEXOL 300 MG/ML  SOLN
100.0000 mL | Freq: Once | INTRAMUSCULAR | Status: AC | PRN
  Administered 2020-06-27: 100 mL via INTRAVENOUS

## 2020-06-27 NOTE — Telephone Encounter (Signed)
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

## 2020-07-01 ENCOUNTER — Other Ambulatory Visit: Payer: Self-pay | Admitting: Family

## 2020-07-01 ENCOUNTER — Encounter: Payer: Self-pay | Admitting: Family

## 2020-07-01 DIAGNOSIS — D508 Other iron deficiency anemias: Secondary | ICD-10-CM

## 2020-07-03 ENCOUNTER — Telehealth: Payer: Self-pay | Admitting: Physician Assistant

## 2020-07-03 ENCOUNTER — Other Ambulatory Visit: Payer: Self-pay | Admitting: Physician Assistant

## 2020-07-03 DIAGNOSIS — D500 Iron deficiency anemia secondary to blood loss (chronic): Secondary | ICD-10-CM

## 2020-07-03 NOTE — Progress Notes (Signed)
Shane [phone]   [phone]  CONSULT NOTE  REFERRING PHYSICIAN: Marrian Salvage  REASON FOR CONSULTATION:  1) Iron Deficiency Anemia 2) Suspicious for rectal carcinoma   HPI Shane [name] is a 69 y.o. male with a history of osteoarthritis and carpal tunnel syndrome is referred to the clinic for evaluation and management of iron deficiency anemia. However, it appears that the patient is also has suspicious rectal carcinoma.  The patient's evaluation began on 06/24/2020 when the patient presented to his primary care provider for routine visit. At that appointment, the patient endorsed  diarrhea x1 week, fatigue, and unexplained weight loss of 10 lbs in 6-8 months. The patient had routine lab work performed that day which noted a low hemoglobin of 9.5, MCV low at 70.2, and elevated platelets at 465K. Per chart review, it appears he started having mild anemia dating back about 10 months ago. The patient's CMP was unremarkable.  The patient's ferritin was found to be low at 12.5. The patient was not placed on oral iron supplements. The patient subsequently had a CT scan of the abdomen and pelvis due to his weight loss and anemia which showed a concern for a rectal neoplasm with a dimension of 7.5 x 6.1 x 5.0 cm.  There appeared to be obstruction due to the rectal neoplasm.  The scan also noted possible perirectal infiltration.  The patient was subsequently referred to gastroenterology.  Gastroenterology called the patient to make an appointment but he had declined to make an appointment at that time.   And the patient was referred to our clinic for consideration of IV iron infusions.  The patient reports that he has lost a approximately 10 lbs in the last 6-8 months which he attributed to his carpal tunnel surgery affecting his dietary habits. He denies any abdominal pain, nausea, vomiting, jaundice, or itching but reports some diarrhea and  hematochezia which he attributed to his hemorrhoids.  He states that he has "a little" bit of blood that is present on the toilet paper when having a bowel movement. His last bowel movement was last night. The patient has never had a colonoscopy. He reports fatigue.  Denies any chest pain, cough, shortness of breath, or lymphadenopathy.  He denies any fever, chills, or night sweats.  Regarding his dietary habits, the patient states that he eats red meat almost daily.  He denies any other bleeding or bruising including epistaxis, gingival bleeding, hemoptysis, hematemesis, or hematuria.   The patient denies excessive ibuprofen use or herbal supplements.  He denies any other bleeding or bruising including epistaxis, hematemesis, or hematuria.  The patient denies any family history of malignancy.  He states that his mother passed away of old age.  He also states that his father passed away of old age.  The patient is an only child.  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  He is currently on disability due to his carpal tunnel.  The patient states that he stopped drinking alcohol in the 1980s.  The patient is a former smoker having smoked approximately 1 pack/day for 10 years.  The patient quit smoking in 1979.  The patient denies drug use.  The patient is married and has 2 children.  HPI  Past Medical History:  Diagnosis Date  . Allergy   . Arthritis     No past surgical history on file.  Family History  Problem Relation Age of Onset  .  Healthy Mother   . Hypertension Father   . Stroke Father     Social History Social History   Tobacco Use  . Smoking status: Former Smoker    Packs/day: 1.00    Years: 10.00    Pack years: 10.00    Types: Cigarettes    Quit date: 08/09/1978    Years since quitting: 41.9  . Smokeless tobacco: Never Used  Vaping Use  . Vaping Use: Never used  Substance Use Topics  . Alcohol use: No    Alcohol/week: 0.0 standard drinks    . Drug use: No    No Known Allergies  Current Outpatient Medications  Medication Sig Dispense Refill  . diclofenac (VOLTAREN) 75 MG EC tablet Take 1 tablet (75 mg total) by mouth 2 (two) times daily. 180 tablet 1  . Diclofenac Sodium (PENNSAID) 2 % SOLN Place 2 g onto the skin 2 (two) times daily. 112 g 3  . Diclofenac Sodium 2 % SOLN Place 2 g onto the skin 2 (two) times daily. 112 g 3  . DULoxetine (CYMBALTA) 30 MG capsule Take 1 capsule (30 mg total) by mouth daily. 30 capsule 3  . gabapentin (NEURONTIN) 300 MG capsule Take 1 capsule (300 mg total) by mouth 2 (two) times daily. 120 capsule 1  . HYDROcodone-acetaminophen (NORCO) 5-325 MG tablet Take 1-2 tablets by mouth daily as needed for moderate pain. 20 tablet 0  . hydrocortisone (ANUSOL-HC) 2.5 % rectal cream Place 1 application rectally 2 (two) times daily. 30 g 0  . naproxen (NAPROSYN) 500 MG tablet Take 1 tablet (500 mg total) by mouth 2 (two) times daily with a meal. 30 tablet 3  . ondansetron (ZOFRAN) 4 MG tablet Take 1 tablet (4 mg total) by mouth every 8 (eight) hours as needed for nausea or vomiting. 40 tablet 0  . pravastatin (PRAVACHOL) 20 MG tablet TAKE 1 TABLET(20 MG) BY MOUTH DAILY 90 tablet 3  . Vitamin D, Ergocalciferol, (DRISDOL) 1.25 MG (50000 UNIT) CAPS capsule TAKE 1 CAPSULE BY MOUTH EVERY 7 DAYS FOR 12 DOSES 12 capsule 0   No current facility-administered medications for this visit.    Review of Systems Constitutional: Positive for weight loss and fatigue. Negative for appetite change, chills, and fever.  HENT: Negative for mouth sores, nosebleeds, sore throat and trouble swallowing.  Eyes: Negative for eye problems and icterus.  Respiratory: Negative for cough, hemoptysis, shortness of breath and wheezing.   Cardiovascular: Negative for chest pain and leg swelling.  Gastrointestinal: Positive for intermittent diarrhea and hematochezia. Negative for abdominal pain, constipation, nausea and vomiting.   Genitourinary: Negative for bladder incontinence, difficulty urinating, dysuria, frequency and hematuria.   Musculoskeletal: Negative for back pain, gait problem, neck pain and neck stiffness.  Skin: Negative for itching and rash.  Neurological: Negative for dizziness, extremity weakness, gait problem, headaches, light-headedness and seizures.  Hematological: Negative for adenopathy. Does not bruise/bleed easily.  Psychiatric/Behavioral: Negative for confusion, depression and sleep disturbance. The patient is not nervous/anxious.     PHYSICAL EXAMINATION:  Blood pressure (!) 156/77, pulse 89, temperature (!) 97 F (36.1 C), temperature source Tympanic, resp. rate 20, height 5\' 11"  (1.803 m), weight 193 lb 4.8 oz (87.7 kg), SpO2 97 %.  ECOG PERFORMANCE STATUS: {1  Physical Exam  Constitutional: Oriented to person, place, and time and well-developed, well-nourished, and in no distress.  HENT:  Head: Normocephalic and atraumatic.  Mouth/Throat: Oropharynx is clear and moist. No oropharyngeal exudate.  Eyes: Conjunctivae are normal. Right  eye exhibits no discharge. Left eye exhibits no discharge. No scleral icterus.  Neck: Normal range of motion. Neck supple.  Cardiovascular: Normal rate, regular rhythm, normal heart sounds and intact distal pulses.   Pulmonary/Chest: Effort normal and breath sounds normal. No respiratory distress. No wheezes. No rales.  Abdominal: Soft. Bowel sounds are normal. Exhibits no distension and no mass. There is no tenderness.  Musculoskeletal: Normal range of motion. Exhibits no edema.  Lymphadenopathy:    No cervical, supraclavicular, axillary, or inguinal nodes Neurological: Alert and oriented to person, place, and time. Exhibits normal muscle tone. Gait normal. Coordination normal.  Skin: Skin is warm and dry. No rash noted. Not diaphoretic. No erythema. No pallor.  Psychiatric: Mood, memory and judgment normal.  Rectal: The patient declined a rectal exam  today.  Vitals reviewed.  LABORATORY DATA: Lab Results  Component Value Date   WBC 6.9 07/04/2020   HGB 9.3 (L) 07/04/2020   HCT 29.2 (L) 07/04/2020   MCV 68.1 (L) 07/04/2020   PLT 464 (H) 07/04/2020      Chemistry      Component Value Date/Time   NA 138 07/04/2020 0845   K 3.8 07/04/2020 0845   CL 108 07/04/2020 0845   CO2 25 07/04/2020 0845   BUN 12 07/04/2020 0845   CREATININE 0.96 07/04/2020 0845   CREATININE 0.97 06/24/2020 1417      Component Value Date/Time   CALCIUM 9.9 07/04/2020 0845   ALKPHOS 93 07/04/2020 0845   AST 16 07/04/2020 0845   ALT 14 07/04/2020 0845   BILITOT 0.4 07/04/2020 0845       RADIOGRAPHIC STUDIES: CT Abdomen Pelvis W Contrast  Result Date: 06/27/2020 CLINICAL DATA:  Anemia, unintentional weight loss, low ferritin, recent elevated blood pressure, fatigue EXAM: CT ABDOMEN AND PELVIS WITH CONTRAST TECHNIQUE: Multidetector CT imaging of the abdomen and pelvis was performed using the standard protocol following bolus administration of intravenous contrast. Sagittal and coronal MPR images reconstructed from axial data set. CONTRAST:  139mL OMNIPAQUE IOHEXOL 300 MG/ML SOLN IV. Dilute oral contrast. COMPARISON:  None FINDINGS: Lower chest: Few tiny bibasilar pulmonary nodules largest 3 mm LEFT lower lobe image 5. Hepatobiliary: Gallbladder unremarkable. Very minimal central intrahepatic biliary dilatation. No focal hepatic mass lesion. Pancreas: Normal appearance Spleen: Normal appearance Adrenals/Urinary Tract: Adrenal glands normal appearance. BILATERAL peripelvic renal cysts, largest cyst 2.9 x 2.3 cm inferior pole RIGHT kidney. No additional renal mass or calcification. Ureters and bladder unremarkable. Stomach/Bowel: Increased stool throughout colon, which appears distended small bowel loops unremarkable. Stomach normal appearance. Abnormal appearance of the rectum which demonstrates irregular thickening and infiltration of perirectal fat planes highly  suspicious for a rectal neoplasm. Stranding extends from the perirectal fat into presacral fat. Visualize mass is questionably 7.5 x 6.1 x 5.0 cm. Vascular/Lymphatic: No adenopathy. Atherosclerotic calcifications aorta and iliac arteries with thought aneurysm. Reproductive: Large BILATERAL hydroceles, LEFT greater than RIGHT. Mild prostatic enlargement. Other: Minimal free fluid in pelvis. No free air. Question small BILATERAL hernias containing fat. Musculoskeletal: Small nonspecific sclerotic focus LEFT inferior pubic ramus. Retrolisthesis L5-S1. Minimal fluid RIGHT hip joint and questionably a RIGHT iliopsoas muscle distally. IMPRESSION: Abnormal appearance of the rectum which demonstrates irregular masslike thickening highly suspicious for a rectal neoplasm measuring approximately 7.5 x 6.1 x 5.0 cm; recommend correlation with colonoscopy. Increased stool throughout colon suspect component of distal colonic obstruction related to above rectal neoplasm. Perirectal infiltration extending into presacral space question local tumor extension. Large BILATERAL hydroceles, LEFT greater than RIGHT.  Few tiny bibasilar pulmonary nodules largest 3 mm LEFT lower lobe, nonspecific; in this patient with a rectal tumor, recommend follow-up CT chest to assess. Aortic Atherosclerosis (ICD10-I70.0). Findings called to Dr. Jenny Reichmann on 06/27/2020 at 1514 hrs. Electronically Signed   By: Lavonia Shane M.D.   On: 06/27/2020 15:18    ASSESSMENT: This is a very pleasant 69 year old African-American male referred to the clinic for evaluation of 1) iron deficiency anemia secondary to #2 2) suspicious rectal neoplasm   PLAN: The patient was seen with Dr. Benay Spice today.  Dr. Benay Spice personally and independently reviewed the patient's CT scan of the abdomen and pelvis discussed the results with the patient today.  Dr. Benay Spice discussed that this patient's imaging studies are highly concerning for a rectal neoplasm.  Dr. Benay Spice also  discussed that it appears that his condition is locally advanced and his recommended treatment would likely involve neoadjuvant chemotherapy/chemoradiation followed by surgery.  Dr. Benay Spice discussed that this is potentially curable. However, he will need to complete the staging work-up before determining/confirming his recommended treatment.  This patient's case will also be discussed at the multidisciplinary conference on 07/17/2020.    The patient seemed a little reluctant to consider treatment.  Dr. Benay Spice strongly encouraged the patient to complete the staging work-up and to consider treatment because there is a concern of impending bowel obstruction which can cause significant morbidity mortality if the patient does not undergo treatment.   To complete the staging work-up, Dr. Benay Spice recommends a CT scan of the chest to rule out metastatic disease to the chest.  He also recommends the patient undergo an MRI of the pelvis to further evaluate the primary lesion and to complete the staging work-up.  This has already been scheduled for 07/12/20.  The patient was strongly encouraged to reschedule his appointment with gastroenterology for a colonoscopy and tissue biopsy to confirm the diagnosis of his condition.  The patient's appointment was rescheduled for Tuesday, 07/09/2020.   I have also placed a referral to radiation oncology to see the patient hopefully ~07/18/20   Regarding the patient's iron deficiency anemia, the patient had a repeat CBC, CMP, ferritin, iron studies, CEA, vitamin O27, and folic acid performed today.  The patient CBC showed a low hemoglobin at 9.3 and MCV at 68.1.  His CEA is slightly elevated at 6.76. His iron studies showed IDA with a ferritin of 28, iron studies at 19, TIBC at 410, saturation radio at 5%, and UIBC at 391.  Dr. Benay Spice recommends that the patient start taking ferrous sulfate 325 mg twice daily.  Dr. Benay Spice discussed that this may cause constipation which may  further exacerbate his obstruction; therefore, he recommends that the patient take a stool softener 2 to 4 tablets daily as well as MiraLAX daily.  The patient was also given a handout on a low fiber diet.  Dr. Benay Spice will see the patient back for a follow-up visit in 2 weeks on 07/18/2020 to review the results of his staging work-up and to have a more detailed discussion about his current condition and treatment options.  The patient voices understanding of current disease status and treatment options and is in agreement with the current care plan.  All questions were answered. The patient knows to call the clinic with any problems, questions or concerns. We can certainly see the patient much sooner if necessary.  Thank you so much for allowing me to participate in the care of Shane [name]. I will continue to follow up  the patient with you and assist in his care.   Shane [name] July 04, 2020, 4:16 PM This was a shared visit with Caddie Randle.  Shane [name] was interviewed and examined.  We reviewed CT images with him.  He presents with iron deficiency anemia, and symptoms/CT findings consistent with rectal cancer.  He appears to have at least locally advanced disease.  I discussed the need for a staging evaluation to be followed by treatment of the rectal cancer.  We are concerned he will become obstructed in the near future.  He was instructed on a low residual diet and will begin a bowel regimen.  He will be scheduled to see gastroenterology early next week.  We will also make surgical and radiation oncology referrals.  He will return for an office visit after the staging evaluation.  He will most likely be a candidate for neoadjuvant therapy unless he requires urgent surgery for obstruction.  Julieanne Manson, MD

## 2020-07-03 NOTE — Telephone Encounter (Signed)
Received a new hem referral from Jodi Mourning, FNP for IDA. Pt has been cld and scheduled to see Cassie on 8/26 at 9am w/labs at 830am. Pt aware to arrive 15 minutes early.

## 2020-07-04 ENCOUNTER — Inpatient Hospital Stay: Payer: 59 | Attending: Physician Assistant | Admitting: Physician Assistant

## 2020-07-04 ENCOUNTER — Other Ambulatory Visit: Payer: Self-pay

## 2020-07-04 ENCOUNTER — Other Ambulatory Visit: Payer: Self-pay | Admitting: Physician Assistant

## 2020-07-04 ENCOUNTER — Inpatient Hospital Stay: Payer: 59

## 2020-07-04 DIAGNOSIS — D500 Iron deficiency anemia secondary to blood loss (chronic): Secondary | ICD-10-CM

## 2020-07-04 DIAGNOSIS — Z7189 Other specified counseling: Secondary | ICD-10-CM | POA: Diagnosis not present

## 2020-07-04 DIAGNOSIS — D509 Iron deficiency anemia, unspecified: Secondary | ICD-10-CM | POA: Insufficient documentation

## 2020-07-04 DIAGNOSIS — K6289 Other specified diseases of anus and rectum: Secondary | ICD-10-CM

## 2020-07-04 DIAGNOSIS — Z87891 Personal history of nicotine dependence: Secondary | ICD-10-CM

## 2020-07-04 LAB — CMP (CANCER CENTER ONLY)
ALT: 14 U/L (ref 0–44)
AST: 16 U/L (ref 15–41)
Albumin: 3.2 g/dL — ABNORMAL LOW (ref 3.5–5.0)
Alkaline Phosphatase: 93 U/L (ref 38–126)
Anion gap: 5 (ref 5–15)
BUN: 12 mg/dL (ref 8–23)
CO2: 25 mmol/L (ref 22–32)
Calcium: 9.9 mg/dL (ref 8.9–10.3)
Chloride: 108 mmol/L (ref 98–111)
Creatinine: 0.96 mg/dL (ref 0.61–1.24)
GFR, Est AFR Am: >60 mL/min (ref >60)
GFR, Estimated: >60 mL/min (ref >60)
Glucose, Bld: 105 mg/dL — ABNORMAL HIGH (ref 70–99)
Potassium: 3.8 mmol/L (ref 3.5–5.1)
Sodium: 138 mmol/L (ref 135–145)
Total Bilirubin: 0.4 mg/dL (ref 0.3–1.2)
Total Protein: 7.7 g/dL (ref 6.5–8.1)

## 2020-07-04 LAB — CBC WITH DIFFERENTIAL (CANCER CENTER ONLY)
Abs Immature Granulocytes: 0.01 10*3/uL (ref 0.00–0.07)
Basophils Absolute: 0.1 10*3/uL (ref 0.0–0.1)
Basophils Relative: 2 %
Eosinophils Absolute: 0.3 10*3/uL (ref 0.0–0.5)
Eosinophils Relative: 5 %
HCT: 29.2 % — ABNORMAL LOW (ref 39.0–52.0)
Hemoglobin: 9.3 g/dL — ABNORMAL LOW (ref 13.0–17.0)
Immature Granulocytes: 0 %
Lymphocytes Relative: 17 %
Lymphs Abs: 1.2 10*3/uL (ref 0.7–4.0)
MCH: 21.7 pg — ABNORMAL LOW (ref 26.0–34.0)
MCHC: 31.8 g/dL (ref 30.0–36.0)
MCV: 68.1 fL — ABNORMAL LOW (ref 80.0–100.0)
Monocytes Absolute: 0.8 10*3/uL (ref 0.1–1.0)
Monocytes Relative: 11 %
Neutro Abs: 4.5 10*3/uL (ref 1.7–7.7)
Neutrophils Relative %: 65 %
Platelet Count: 464 10*3/uL — ABNORMAL HIGH (ref 150–400)
RBC: 4.29 MIL/uL (ref 4.22–5.81)
RDW: 17.1 % — ABNORMAL HIGH (ref 11.5–15.5)
WBC Count: 6.9 10*3/uL (ref 4.0–10.5)
nRBC: 0 % (ref 0.0–0.2)

## 2020-07-04 LAB — IRON AND TIBC
Iron: 19 ug/dL — ABNORMAL LOW (ref 42–163)
Saturation Ratios: 5 % — ABNORMAL LOW (ref 20–55)
TIBC: 410 ug/dL — ABNORMAL HIGH (ref 202–409)
UIBC: 391 ug/dL — ABNORMAL HIGH (ref 117–376)

## 2020-07-04 LAB — CEA (IN HOUSE-CHCC): CEA (CHCC-In House): 6.76 ng/mL — ABNORMAL HIGH (ref 0.00–5.00)

## 2020-07-04 LAB — FERRITIN: Ferritin: 28 ng/mL (ref 24–336)

## 2020-07-04 NOTE — Patient Instructions (Addendum)
-  I know we covered a lot of important information today. Here is a summary of the main points moving forward -Regarding the iron deficiency, please go to the store and pick up ferrous sulfate 325 mg. You may take this 2x per day. This can cause some constipation and we don't want there to be a blockage of your stool, so it is important that you also take senna or colace 2-4 tablets daily as well as miralax daily.  -We need to complete the "staging process", which is important to make sure we know exactly the extent of the tumor so we know how to best treat you. We will order a MRI of the pelvis as well as a CT scan of the chest, just to make sure that nothing has spread to this region. I would like for this to be done next week. They should be calling you so please make sure you are checking your phone and voicemail for someone to call you to schedule this. Please make sure to return their call if they call you and if you miss their call -Please call the gastroenterologist back as soon as you leave here to schedule a time to see them as early next week as possible -Please look at the diet handout that is attached here.  -We will see you back around 07/18/20. We will discuss your case at the conference all of the different disciplines of doctors have to make sure we all come to an agreement/plan as to the best treatment for you. We will also refer you to radiation and see if they can see you on 9/9 when you are coming back to see Dr. Benay Spice that day anyway.

## 2020-07-04 NOTE — Progress Notes (Signed)
Met with patient today at medical oncology consult with Cassie Heilingoetter PA-C and Dr. Julieanne Manson. I explained my role as nurse navigator and he was given my card with my direct phone number.  I have encouraged him to call with questions or concerns as they arise.  He verbalized an understanding of the plan of care outlined today.

## 2020-07-05 ENCOUNTER — Other Ambulatory Visit: Payer: Self-pay

## 2020-07-05 DIAGNOSIS — K6289 Other specified diseases of anus and rectum: Secondary | ICD-10-CM

## 2020-07-08 ENCOUNTER — Ambulatory Visit: Payer: 59 | Admitting: Physician Assistant

## 2020-07-09 ENCOUNTER — Ambulatory Visit: Payer: 59 | Admitting: Gastroenterology

## 2020-07-09 ENCOUNTER — Encounter: Payer: Self-pay | Admitting: Gastroenterology

## 2020-07-09 VITALS — BP 148/72 | HR 88 | Ht 70.0 in | Wt 195.4 lb

## 2020-07-09 DIAGNOSIS — K625 Hemorrhage of anus and rectum: Secondary | ICD-10-CM | POA: Diagnosis not present

## 2020-07-09 DIAGNOSIS — K6289 Other specified diseases of anus and rectum: Secondary | ICD-10-CM

## 2020-07-09 DIAGNOSIS — R197 Diarrhea, unspecified: Secondary | ICD-10-CM | POA: Insufficient documentation

## 2020-07-09 MED ORDER — PLENVU 140 G PO SOLR: 1.0000 | ORAL | 0 refills | Status: DC

## 2020-07-09 NOTE — Progress Notes (Signed)
Agree with the assessment and plan as outlined by Jessica Zehr, PA-C. ? ?Nas Wafer, DO, FACG ? ?

## 2020-07-09 NOTE — Patient Instructions (Signed)
If you are age 71 or older, your body mass index should be between 23-30. Your Body mass index is 28.03 kg/m. If this is out of the aforementioned range listed, please consider follow up with your Primary Care Provider.  If you are age 36 or younger, your body mass index should be between 19-25. Your Body mass index is 28.03 kg/m. If this is out of the aformentioned range listed, please consider follow up with your Primary Care Provider.   You have been scheduled for a colonoscopy. Please follow written instructions given to you at your visit today.  Please pick up your prep supplies at the pharmacy within the next 1-3 days. If you use inhalers (even only as needed), please bring them with you on the day of your procedure.  Follow up pending the results of your Colonoscopy.

## 2020-07-09 NOTE — Progress Notes (Signed)
07/09/2020 Shane [name] [id number] 1951/01/08   HISTORY OF PRESENT ILLNESS:  This is a pleasant 69 year old male who is new to our office.  Being referred here by his PCP, Marrian Salvage, FNP, for evaluation of a rectal mass that was seen on CT scan.  He reports that he has had about 10 pound weight loss since December.  Was complaining of diarrhea for the past 2 months.  Does report small amounts of rectal bleeding as well.  CT scan of the abdomen pelvis with contrast earlier this month showed the following:   IMPRESSION: Abnormal appearance of the rectum which demonstrates irregular masslike thickening highly suspicious for a rectal neoplasm measuring approximately 7.5 x 6.1 x 5.0 cm; recommend correlation with colonoscopy.  Increased stool throughout colon suspect component of distal colonic obstruction related to above rectal neoplasm.  Perirectal infiltration extending into presacral space question local tumor extension.  Large BILATERAL hydroceles, LEFT greater than RIGHT.  Few tiny bibasilar pulmonary nodules largest 3 mm LEFT lower lobe, nonspecific; in this patient with a rectal tumor, recommend follow-up CT chest to assess.  Aortic Atherosclerosis (ICD10-I70.0).   He has already seeing oncology, Dr. Benay Spice.  They recommended that he take MiraLAX and Colace stool softeners so that he does not get completely obstructed.  He says that he already notices improvement in his bowel habits since beginning this.  He denies any abdominal pain.  He has never had colonoscopy in the past.    Past Medical History:  Diagnosis Date  . Allergy   . Arthritis    History reviewed. No pertinent surgical history.  reports that he quit smoking about 41 years ago. His smoking use included cigarettes. He has a 10.00 pack-year smoking history. He has never used smokeless tobacco. He reports that he does not drink alcohol and does not use drugs. family history includes  Healthy in his mother; Hypertension in his father; Stroke in his father. No Known Allergies    Outpatient Encounter Medications as of 07/09/2020  Medication Sig  . diclofenac (VOLTAREN) 75 MG EC tablet Take 1 tablet (75 mg total) by mouth 2 (two) times daily.  . Diclofenac Sodium (PENNSAID) 2 % SOLN Place 2 g onto the skin 2 (two) times daily.  . Diclofenac Sodium 2 % SOLN Place 2 g onto the skin 2 (two) times daily.  . DULoxetine (CYMBALTA) 30 MG capsule Take 1 capsule (30 mg total) by mouth daily.  . hydrocortisone (ANUSOL-HC) 2.5 % rectal cream Place 1 application rectally 2 (two) times daily.  . naproxen (NAPROSYN) 500 MG tablet Take 1 tablet (500 mg total) by mouth 2 (two) times daily with a meal.  . pravastatin (PRAVACHOL) 20 MG tablet TAKE 1 TABLET(20 MG) BY MOUTH DAILY  . PEG-KCl-NaCl-NaSulf-Na Asc-C (PLENVU) 140 g SOLR Take 1 kit by mouth as directed.  . [DISCONTINUED] gabapentin (NEURONTIN) 300 MG capsule Take 1 capsule (300 mg total) by mouth 2 (two) times daily.  . [DISCONTINUED] HYDROcodone-acetaminophen (NORCO) 5-325 MG tablet Take 1-2 tablets by mouth daily as needed for moderate pain.  . [DISCONTINUED] ondansetron (ZOFRAN) 4 MG tablet Take 1 tablet (4 mg total) by mouth every 8 (eight) hours as needed for nausea or vomiting.  . [DISCONTINUED] Vitamin D, Ergocalciferol, (DRISDOL) 1.25 MG (50000 UNIT) CAPS capsule TAKE 1 CAPSULE BY MOUTH EVERY 7 DAYS FOR 12 DOSES   No facility-administered encounter medications on file as of 07/09/2020.    REVIEW OF SYSTEMS  : All other systems  reviewed and negative except where noted in the History of Present Illness.   PHYSICAL EXAM: BP (!) 148/72 (BP Location: Left Arm, Patient Position: Sitting, Cuff Size: Normal)   Pulse 88   Ht 5' 10"  (1.778 m)   Wt 195 lb 6 oz (88.6 kg)   BMI 28.03 kg/m  General: Well developed AA male in no acute distress Head: Normocephalic and atraumatic Eyes:  Sclerae anicteric, conjunctiva pink. Ears: Normal  auditory acuity  Lungs: Clear throughout to auscultation; no increased WOB Heart: Regular rate and rhythm; no M/R/G. Abdomen: Soft, non-distended.  BS present.  Non-tender. Rectal:  Will be done at the time of colonoscopy. Musculoskeletal: Symmetrical with no gross deformities  Skin: No lesions on visible extremities Extremities: No edema  Neurological: Alert oriented x 4, grossly non-focal Psychological:  Alert and cooperative. Normal mood and affect  ASSESSMENT AND PLAN: *69 year old male with CT scan showing masslike thickening in the rectum suspicious for malignancy.  Shows increased stool throughout the colon with a suspected component of distal colonic obstruction.  He is moving his bowels, was having diarrhea, which was likely overflow.  He says that he has noticed improvement after starting Colace and MiraLAX per oncology.  He has already seen oncology.  Has never had colonoscopy.  We will plan for colonoscopy with Dr. Bryan Lemma since he has next available procedure.  We will plan for 2-day bowel prep.  The risks, benefits, and alternatives to colonoscopy were discussed with the patient and he consents to proceed.   CC:  Marrian Salvage,*

## 2020-07-10 ENCOUNTER — Encounter: Payer: Self-pay | Admitting: Gastroenterology

## 2020-07-10 ENCOUNTER — Other Ambulatory Visit: Payer: Self-pay

## 2020-07-12 ENCOUNTER — Other Ambulatory Visit: Payer: Self-pay | Admitting: Physician Assistant

## 2020-07-12 ENCOUNTER — Other Ambulatory Visit: Payer: Self-pay

## 2020-07-12 ENCOUNTER — Encounter (HOSPITAL_COMMUNITY): Payer: Self-pay

## 2020-07-12 ENCOUNTER — Ambulatory Visit (HOSPITAL_COMMUNITY)
Admission: RE | Admit: 2020-07-12 | Discharge: 2020-07-12 | Disposition: A | Discharge status: Home / Self Care | Payer: 59 | Source: Ambulatory Visit | Attending: Physician Assistant | Admitting: Physician Assistant

## 2020-07-12 DIAGNOSIS — D509 Iron deficiency anemia, unspecified: Secondary | ICD-10-CM | POA: Insufficient documentation

## 2020-07-12 DIAGNOSIS — K6289 Other specified diseases of anus and rectum: Secondary | ICD-10-CM

## 2020-07-12 MED ORDER — IOHEXOL 300 MG/ML  SOLN
75.0000 mL | Freq: Once | INTRAMUSCULAR | Status: AC | PRN
  Administered 2020-07-12: 75 mL via INTRAVENOUS

## 2020-07-14 ENCOUNTER — Other Ambulatory Visit: Payer: Self-pay

## 2020-07-14 DIAGNOSIS — Z87891 Personal history of nicotine dependence: Secondary | ICD-10-CM | POA: Diagnosis not present

## 2020-07-14 DIAGNOSIS — C20 Malignant neoplasm of rectum: Secondary | ICD-10-CM | POA: Insufficient documentation

## 2020-07-14 DIAGNOSIS — K623 Rectal prolapse: Secondary | ICD-10-CM | POA: Insufficient documentation

## 2020-07-14 DIAGNOSIS — K6289 Other specified diseases of anus and rectum: Secondary | ICD-10-CM | POA: Diagnosis present

## 2020-07-14 NOTE — ED Triage Notes (Signed)
Patient reports he had an MRI on Friday and had something injected into his rectum which has been causing a lot of swelling. Patient reports he has tried Sitz soaks without relief. Patient reports he has only been able to move a small amount of stool.

## 2020-07-15 ENCOUNTER — Emergency Department (HOSPITAL_COMMUNITY)
Admission: EM | Admit: 2020-07-15 | Discharge: 2020-07-15 | Disposition: A | Discharge status: Home / Self Care | Payer: 59 | Attending: Emergency Medicine | Admitting: Emergency Medicine

## 2020-07-15 DIAGNOSIS — K623 Rectal prolapse: Secondary | ICD-10-CM

## 2020-07-15 LAB — CBC WITH DIFFERENTIAL/PLATELET
Abs Immature Granulocytes: 0.1 10*3/uL — ABNORMAL HIGH (ref 0.00–0.07)
Basophils Absolute: 0.1 10*3/uL (ref 0.0–0.1)
Basophils Relative: 1 %
Eosinophils Absolute: 0.2 10*3/uL (ref 0.0–0.5)
Eosinophils Relative: 1 %
HCT: 27 % — ABNORMAL LOW (ref 39.0–52.0)
Hemoglobin: 8.7 g/dL — ABNORMAL LOW (ref 13.0–17.0)
Immature Granulocytes: 1 %
Lymphocytes Relative: 6 %
Lymphs Abs: 1 10*3/uL (ref 0.7–4.0)
MCH: 22.9 pg — ABNORMAL LOW (ref 26.0–34.0)
MCHC: 32.2 g/dL (ref 30.0–36.0)
MCV: 71.1 fL — ABNORMAL LOW (ref 80.0–100.0)
Monocytes Absolute: 1.4 10*3/uL — ABNORMAL HIGH (ref 0.1–1.0)
Monocytes Relative: 8 %
Neutro Abs: 15.3 10*3/uL — ABNORMAL HIGH (ref 1.7–7.7)
Neutrophils Relative %: 83 %
Platelets: 426 10*3/uL — ABNORMAL HIGH (ref 150–400)
RBC: 3.8 MIL/uL — ABNORMAL LOW (ref 4.22–5.81)
RDW: 19.9 % — ABNORMAL HIGH (ref 11.5–15.5)
WBC: 18.2 10*3/uL — ABNORMAL HIGH (ref 4.0–10.5)
nRBC: 0 % (ref 0.0–0.2)

## 2020-07-15 LAB — BASIC METABOLIC PANEL
Anion gap: 9 (ref 5–15)
BUN: 21 mg/dL (ref 8–23)
CO2: 21 mmol/L — ABNORMAL LOW (ref 22–32)
Calcium: 9.2 mg/dL (ref 8.9–10.3)
Chloride: 106 mmol/L (ref 98–111)
Creatinine, Ser: 0.94 mg/dL (ref 0.61–1.24)
GFR calc Af Amer: >60 mL/min (ref >60)
GFR calc non Af Amer: >60 mL/min (ref >60)
Glucose, Bld: 100 mg/dL — ABNORMAL HIGH (ref 70–99)
Potassium: 3.7 mmol/L (ref 3.5–5.1)
Sodium: 136 mmol/L (ref 135–145)

## 2020-07-15 MED ORDER — LIDOCAINE-EPINEPHRINE-TETRACAINE (LET) TOPICAL GEL
3.0000 mL | Freq: Once | TOPICAL | Status: DC
  Filled 2020-07-15: qty 3

## 2020-07-15 MED ORDER — MIDAZOLAM HCL 2 MG/2ML IJ SOLN
2.0000 mg | Freq: Once | INTRAMUSCULAR | Status: AC
  Administered 2020-07-15: 2 mg via INTRAVENOUS
  Filled 2020-07-15: qty 2

## 2020-07-15 MED ORDER — HYDROMORPHONE HCL 1 MG/ML IJ SOLN
1.0000 mg | Freq: Once | INTRAMUSCULAR | Status: AC
  Administered 2020-07-15: 1 mg via INTRAVENOUS
  Filled 2020-07-15: qty 1

## 2020-07-15 MED ORDER — MIDAZOLAM HCL 2 MG/2ML IJ SOLN
INTRAMUSCULAR | Status: AC
  Administered 2020-07-15: 2 mg via INTRAVENOUS
  Filled 2020-07-15: qty 4

## 2020-07-15 MED ORDER — HYDROCODONE-ACETAMINOPHEN 5-325 MG PO TABS
1.0000 | ORAL_TABLET | Freq: Three times a day (TID) | ORAL | 0 refills | Status: DC | PRN
Start: 2020-07-15 — End: 2020-07-18

## 2020-07-15 MED ORDER — MIDAZOLAM HCL 2 MG/2ML IJ SOLN: 4.0000 mg | Freq: Once | INTRAMUSCULAR | Status: AC

## 2020-07-15 NOTE — Discharge Instructions (Signed)
Prescription given for Norco. Take medication as directed and do not operate machinery, drive a car, or work while taking this medication as it can make you drowsy.   Please follow up with Dr. Marcello Moores within 5-7 days for re-evaluation of your symptoms.   Please return to the emergency department for any new or worsening symptoms.

## 2020-07-15 NOTE — Consult Note (Addendum)
Reason for Consult/Chief Complaint: rectal prolapse Consultant: [place], PA  Shane [name] is an 69 y.o. male.   HPI: 88M initially presented to PCP with new onset of diarrhea approximately 3 weeks ago. Follow up included identification of anemia and imaging suggestive of rectal malignancy. He underwent pelvic MRI on 9/3 during which time he underwent rectal contrast and since the procedure was performed, he noted significant swelling of the rectum and peri-anal area. Tried multiple sitz baths at home without relief. Reports passage of flatus and a small amount of stool since the MRI. Has been taking miralax and colace and usually stool is liquid in nature. No prior history of colonoscopy, scheduled for scope on 07/17/2020. Has an appointment scheduled with Dr. Leighton Ruff on 9/67. Has been evaluated by Dr. Benay Spice with [facility]. No family history of colon cancer.   Past Medical History:  Diagnosis Date  . Allergy   . Arthritis     No past surgical history on file.  Family History  Problem Relation Age of Onset  . Healthy Mother   . Hypertension Father   . Stroke Father     Social History:  reports that he quit smoking about 41 years ago. His smoking use included cigarettes. He has a 10.00 pack-year smoking history. He has never used smokeless tobacco. He reports that he does not drink alcohol and does not use drugs.  Allergies: No Known Allergies  Medications: I have reviewed the patient's current medications.  Results for orders placed or performed during the hospital encounter of 07/15/20 (from the past 48 hour(s))  CBC with Differential/Platelet     Status: Abnormal   Collection Time: 07/15/20 12:48 AM  Result Value Ref Range   WBC 18.2 (H) 4.0 - 10.5 K/uL   RBC 3.80 (L) 4.22 - 5.81 MIL/uL   Hemoglobin 8.7 (L) 13.0 - 17.0 g/dL    Comment: Reticulocyte Hemoglobin testing may be clinically indicated, consider ordering this additional test [id number]    HCT 27.0 (L) 39 -  52 %   MCV 71.1 (L) 80.0 - 100.0 fL   MCH 22.9 (L) 26.0 - 34.0 pg   MCHC 32.2 30.0 - 36.0 g/dL   RDW 19.9 (H) 11.5 - 15.5 %   Platelets 426 (H) 150 - 400 K/uL   nRBC 0.0 0.0 - 0.2 %   Neutrophils Relative % 83 %   Neutro Abs 15.3 (H) 1.7 - 7.7 K/uL   Lymphocytes Relative 6 %   Lymphs Abs 1.0 0.7 - 4.0 K/uL   Monocytes Relative 8 %   Monocytes Absolute 1.4 (H) 0 - 1 K/uL   Eosinophils Relative 1 %   Eosinophils Absolute 0.2 0 - 0 K/uL   Basophils Relative 1 %   Basophils Absolute 0.1 0 - 0 K/uL   Immature Granulocytes 1 %   Abs Immature Granulocytes 0.10 (H) 0.00 - 0.07 K/uL    Comment: Performed at [facility], [place] [address], [place], [place] [address]    No results found.  ROS 10 point review of systems is negative except as listed above in HPI.   Physical Exam Blood pressure (!) 129/109, pulse 99, temperature 98.6 F (37 C), temperature source Oral, resp. rate 16, SpO2 100 %. Constitutional: well-developed, well-nourished HEENT: pupils equal, round, reactive to light, 10mm b/l, moist conjunctiva, external inspection of ears and nose normal, hearing intact Oropharynx: normal oropharyngeal mucosa, normal dentition Neck: no thyromegaly, trachea midline, no midline cervical tenderness to palpation Chest: breath  sounds equal bilaterally, normal respiratory effort, no midline or lateral chest wall tenderness to palpation/deformity Abdomen: soft, NT, no bruising, no hepatosplenomegaly GU: no blood at urethral meatus of penis, no scrotal masses or abnormality  Back: no wounds, no thoracic/lumbar spine tenderness to palpation, no thoracic/lumbar spine stepoffs Rectal: engorged external hemorrhoids, minimal amount of rectal prolapse, easily reduced, anus digitized with return of flatus Extremities: 2+ radial and pedal pulses bilaterally, motor and sensation intact to bilateral UE and LE, no peripheral edema MSK: normal gait/station, no clubbing/cyanosis of  fingers/toes, normal ROM of all four extremities Skin: warm, dry, no rashes Psych: normal memory, normal mood/affect    Assessment/Plan: 59M with presumed T4N0Mx rectal cancer, with pending completion of malignancy workup presents with external hemorrhoids  Hemorrhoidal engorgement likely 2/2 straining due to rectal pathology and digitization during admin of rectal contrast for pelvic MRI. Minimal rectal prolapse reduced. Continue sitz baths, stool softeners, and prep for colonoscopy on 9/8. Rx for pain control and patient counseled to increase miralax dose to counteract opiate effects. All questions answered. Dr. Marcello Moores notified of patient presentation to ED.    Jesusita Oka, MD General and [facility]

## 2020-07-15 NOTE — ED Provider Notes (Signed)
[facility] DEPT Provider Note   CSN: [id number] Arrival date & time: 07/14/20  1144     History Chief Complaint  Patient presents with   Rectal Swelling    Shane [name] is a 69 y.o. male.  HPI   69 year old male with a history of allergy, arthritis, rectal cancer, who presents the emergency department today complaining of rectal pain.  Patient states he had an MRI of the rectum 2 days ago and after the insertion of contrast he states that he has had swelling in the rectum.  He also has increased pain to his noticed little bit of blood on the tissue when he wipes.  He denies any bloody bowel movements.  He denies any other systemic symptoms at this time.  He has been taking naproxen with mild relief of his symptoms.  He has also tried sitz bath's which has improved his symptoms but only temporarily.  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     Family History  Problem Relation Age of Onset   Healthy Mother    Hypertension Father    Stroke Father     Social History   Tobacco Use   Smoking status: Former Smoker    Packs/day: 1.00    Years: 10.00    Pack years: 10.00    Types: Cigarettes    Quit date: 08/09/1978    Years since quitting: 41.9    Smokeless tobacco: Never Used  Vaping Use   Vaping Use: Never used  Substance Use Topics   Alcohol use: No    Alcohol/week: 0.0 standard drinks   Drug use: No    Home Medications Prior to Admission medications   Medication Sig Start Date End Date Taking? Authorizing Provider  diclofenac (VOLTAREN) 75 MG EC tablet Take 1 tablet (75 mg total) by mouth 2 (two) times daily. 06/06/20  Yes Lyndal Pulley, DO  docusate sodium (COLACE) 100 MG capsule Take 100 mg by mouth daily as needed for mild constipation.   Yes [provider]  polyethylene glycol (MIRALAX / GLYCOLAX) 17 g packet Take 17 g by mouth daily.   Yes [provider]  Polysaccharide Iron Complex (FERREX 150 PO) Take 1 tablet by mouth in the morning and at bedtime.   Yes [provider]  pravastatin (PRAVACHOL) 20 MG tablet TAKE 1 TABLET(20 MG) BY MOUTH DAILY 10/10/19  Yes Marrian Salvage, FNP  Diclofenac Sodium (PENNSAID) 2 % SOLN Place 2 g onto the skin 2 (two) times daily. 02/23/18   Lyndal Pulley, DO  Diclofenac Sodium 2 % SOLN Place 2 g onto the skin 2 (two) times daily. 06/30/19   Lyndal Pulley, DO  DULoxetine (CYMBALTA) 30 MG capsule Take 1 capsule (30 mg total) by mouth daily. 08/30/19   Valere Dross,  [name], FNP  HYDROcodone-acetaminophen (NORCO/VICODIN) 5-325 MG tablet Take 1 tablet by mouth every 8 (eight) hours as needed. 07/15/20   Janelle Spellman S, PA-C  hydrocortisone (ANUSOL-HC) 2.5 % rectal cream Place 1 application rectally 2 (two) times daily. 06/17/20   Marrian Salvage, FNP  naproxen (NAPROSYN) 500 MG tablet Take 1 tablet (500 mg total) by mouth 2 (two) times daily with a meal. 01/30/20   Leandrew Koyanagi, MD  PEG-KCl-NaCl-NaSulf-Na Asc-C (PLENVU) 140 g SOLR Take 1 kit by mouth as directed. 07/09/20   Zehr, Laban Emperor, PA-C    Allergies    Patient has no known allergies.  Review of Systems   Review of Systems  Constitutional: Negative for fever.  HENT: Negative for sore  throat.   Respiratory: Negative for shortness of breath.   Cardiovascular: Negative for chest pain.  Gastrointestinal: Positive for rectal pain.  Genitourinary: Negative for flank pain.  Musculoskeletal: Negative for back pain.  Skin: Negative for rash.  Neurological: Negative for headaches.  All other systems reviewed and are negative.   Physical Exam Updated Vital Signs BP (!) 120/44 (BP Location: Left Arm)    Pulse 68    Temp 98.2 F (36.8 C) (Oral)    Resp 12    Ht $R'5\' 10"'IN$  (1.778 m)    Wt 88.5 kg    SpO2 100%    BMI 27.98 kg/m   Physical Exam Vitals and nursing note reviewed.  Constitutional:      Appearance: He is well-developed.  HENT:     Head: Normocephalic and atraumatic.  Eyes:     Conjunctiva/sclera: Conjunctivae normal.  Cardiovascular:     Rate and Rhythm: Normal rate.  Pulmonary:     Effort: Pulmonary effort is normal.  Abdominal:     General: Abdomen is flat.  Genitourinary:    Comments: Chaperone present during exam. Rectal tissue/suspected tumor appears to be prolapse. The area is significantly TTP, but all tissue appears soft and there is no obvious erythema, induration, or fluctuance. No drainage noted.  Musculoskeletal:     Cervical back: Neck supple.  Skin:    General: Skin is warm and dry.  Neurological:     Mental Status: He is alert.     ED Results / Procedures / Treatments   Labs (all labs ordered are listed, but only abnormal results are displayed) Labs Reviewed  CBC WITH DIFFERENTIAL/PLATELET - Abnormal; Notable for the following components:      Result Value   WBC 18.2 (*)    RBC 3.80 (*)    Hemoglobin 8.7 (*)    HCT 27.0 (*)    MCV 71.1 (*)    MCH 22.9 (*)    RDW 19.9 (*)    Platelets 426 (*)    Neutro Abs 15.3 (*)    Monocytes Absolute 1.4 (*)    Abs Immature Granulocytes 0.10 (*)    All other components within normal limits  BASIC METABOLIC PANEL - Abnormal; Notable for the following components:   CO2 21 (*)    Glucose, Bld  100 (*)    All other components within normal limits    EKG None  Radiology No results found.  Procedures Procedures (including critical care time)  Medications Ordered in ED Medications  lidocaine-EPINEPHrine-tetracaine (LET) topical gel (0 mLs Topical Hold 07/15/20 0048)  HYDROmorphone (DILAUDID) injection 1 mg (1 mg Intravenous Given 07/15/20 0048)  midazolam (VERSED) injection 4 mg (2 mg Intravenous Given by Other 07/15/20 0130)  HYDROmorphone (DILAUDID)  injection 1 mg (1 mg Intravenous Given 07/15/20 0340)  midazolam (VERSED) injection 2 mg (2 mg Intravenous Given 07/15/20 0340)    ED Course  I have reviewed the triage vital signs and the nursing notes.  Pertinent labs & imaging results that were available during my care of the patient were reviewed by me and considered in my medical decision making (see chart for details).    MDM Rules/Calculators/A&P                          69 year old male with history of rectal cancer presenting for evaluation of rectal pain.  Underwent MRI 2 days ago and since then has had swelling to the rectum.  On exam it appears that his rectal mass is prolapsed.  He has significant tenderness to palpation.  There is no obvious evidence of infection on exam.  Attending physician, Dr. Kingsley Callander, discussed case with general surgery Dr. Bobbye Morton who evaluated the patient at bedside and reduced the mass.   Patient was still complaining of pain following this and he was reevaluated by Dr. Wyvonnia Dusky and myself.  Dr. Wyvonnia Dusky also performed reduction of the area.  Following this patient reassessed and he states he feels much improved.  Plan is to have patient follow-up with Dr. Marcello Moores colorectal surgery.  I will give a short course of pain medications for his symptoms.  Have advised that he will need to take MiraLAX pain medications help prevent any constipation.  Advised on specific return precautions.  He voices understanding of the plan and reasons to return.  Questions  answered.  Patient stable for discharge.  Final Clinical Impression(s) / ED Diagnoses Final diagnoses:  Partial rectal prolapse    Rx / DC Orders ED Discharge Orders         Ordered    HYDROcodone-acetaminophen (NORCO/VICODIN) 5-325 MG tablet  Every 8 hours PRN        07/15/20 0513           Rodney Booze, PA-C 07/15/20 0513    Ezequiel Essex, MD 07/15/20 [id number]

## 2020-07-17 ENCOUNTER — Other Ambulatory Visit: Payer: Self-pay

## 2020-07-17 ENCOUNTER — Telehealth: Payer: Self-pay

## 2020-07-17 ENCOUNTER — Encounter: Payer: Self-pay | Admitting: Gastroenterology

## 2020-07-17 ENCOUNTER — Ambulatory Visit (AMBULATORY_SURGERY_CENTER): Payer: 59 | Admitting: Gastroenterology

## 2020-07-17 VITALS — BP 130/77 | HR 88 | Temp 96.9°F | Resp 24 | Ht 70.0 in | Wt 195.0 lb

## 2020-07-17 DIAGNOSIS — C20 Malignant neoplasm of rectum: Secondary | ICD-10-CM | POA: Diagnosis not present

## 2020-07-17 DIAGNOSIS — K6289 Other specified diseases of anus and rectum: Secondary | ICD-10-CM

## 2020-07-17 DIAGNOSIS — K643 Fourth degree hemorrhoids: Secondary | ICD-10-CM | POA: Diagnosis not present

## 2020-07-17 MED ORDER — SODIUM CHLORIDE 0.9 % IV SOLN: 500.0000 mL | Freq: Once | INTRAVENOUS | Status: DC

## 2020-07-17 NOTE — Progress Notes (Signed)
pt tolerated well. VSS. awake and to recovery. Report given to RN.  

## 2020-07-17 NOTE — Progress Notes (Signed)
Called to room to assist during endoscopic procedure.  Patient ID and intended procedure confirmed with present staff. Received instructions for my participation in the procedure from the performing physician.  

## 2020-07-17 NOTE — Progress Notes (Signed)
Lidocaine 2% 25ml iv given as per Dr. Bryan Lemma.

## 2020-07-17 NOTE — Progress Notes (Signed)
GI Location of Tumor / Histology: Rectal Cancer  Shane [name] presented with anemia, unintentional weight loss.  Flexible Sigmoidoscopy 07/17/2020  MRI Pelvis 07/12/2020:  Assuming rectal adenocarcinoma, T stage: T4  Assuming rectal adenocarcinoma, N stage: N0, although assessment is limited by bulky size of tumor  Distance from tumor to the internal anal sphincter is 6.6 cm.  CT Chest 07/12/2020: There are multiple small pulmonary nodules throughout the lungs, the largest measuring 3 mm in the left pulmonary apex. These are nonspecific and indeterminate for metastatic disease, although suspicious given locally advanced malignancy in the pelvis.  CT AP 06/27/2020: Abnormal appearance of the rectum which demonstrates irregular masslike thickening highly suspicious for a rectal neoplasm measuring approximately 7.5 x 6.1 x 5.0 cm; recommend correlation with colonoscopy.  Biopsies of Rectal Mass 07/17/2020 Path Pending  Past/Anticipated interventions by surgeon, if any:   Past/Anticipated interventions by medical oncology, if any:  Dr. Benay Spice 07/18/2020 -Chemo discussion- total neoadjuvant chemo with FOLFOX 8 cycles followed by xeloda with radiation. -Port insertion  Cassie PA/ Dr. Benay Spice 07/04/2020 -Dr. Benay Spice also discussed that it appears that his condition is locally advanced and his recommended treatment would likely involve neoadjuvant chemotherapy/chemoradiation followed by surgery.   -Dr. Benay Spice discussed that this is potentially curable. However, he will need to complete the staging work-up before determining/confirming his recommended treatment.  This patient's case will also be discussed at the multidisciplinary conference on 07/17/2020. -Complete staging work up with CT Chest, MRI Pelvis. -I have also placed a referral to radiation oncology to see the patient hopefully ~07/18/20     Weight changes, if any: lost about 3-4 pounds in the last 6 weeks.  Bowel/Bladder complaints,  if any: Has occasional diarrhea.  Nausea / Vomiting, if any: No  Pain issues, if any:  He has some pain in relation to his hemorrhoids.  Any blood per rectum: Has small amounts of blood on the toilet paper, related to inflamed hemorroids.    SAFETY ISSUES:  Prior radiation? No  Pacemaker/ICD? No  Possible current pregnancy? n/a  Is the patient on methotrexate? No  Current Complaints/Details:

## 2020-07-17 NOTE — Telephone Encounter (Signed)
-----   Message from Lavena Bullion, DO sent at 07/17/2020 10:42 AM EDT ----- Regarding: RE: recticare Yes. They were asking if his insurance would cover. I said not likely, but we could check and will also check if we had coupons for it. Could you give them a call with the answer and instructions on use. Thanks. ----- Message ----- From: Lowell Guitar, RMA Sent: 07/17/2020   9:23 AM EDT To: Lavena Bullion, DO Subject: recticare                                      Recticare - OTC - as directed? thanks ----- Message ----- From: Lavena Bullion, DO Sent: 07/17/2020   8:59 AM EDT To: Lowell Guitar, RMA  Can you please get this patrient set up for Recticare with instructions. Thanks

## 2020-07-17 NOTE — Patient Instructions (Signed)
Please read handouts provided. Await pathology results. Appointment with Dr. Marcello Moores next week.      YOU HAD AN ENDOSCOPIC PROCEDURE TODAY AT [facility]:   Refer to the procedure report that was given to you for any specific questions about what was found during the examination.  If the procedure report does not answer your questions, please call your gastroenterologist to clarify.  If you requested that your care partner not be given the details of your procedure findings, then the procedure report has been included in a sealed envelope for you to review at your convenience later.  YOU SHOULD EXPECT: Some feelings of bloating in the abdomen. Passage of more gas than usual.  Walking can help get rid of the air that was put into your GI tract during the procedure and reduce the bloating. If you had a lower endoscopy (such as a colonoscopy or flexible sigmoidoscopy) you may notice spotting of blood in your stool or on the toilet paper. If you underwent a bowel prep for your procedure, you may not have a normal bowel movement for a few days.  Please Note:  You might notice some irritation and congestion in your nose or some drainage.  This is from the oxygen used during your procedure.  There is no need for concern and it should clear up in a day or so.  SYMPTOMS TO REPORT IMMEDIATELY:   Following lower endoscopy (colonoscopy or flexible sigmoidoscopy):  Excessive amounts of blood in the stool  Significant tenderness or worsening of abdominal pains  Swelling of the abdomen that is new, acute  Fever of 100F or higher   For urgent or emergent issues, a gastroenterologist can be reached at any hour by calling [phone]. Do not use MyChart messaging for urgent concerns.    DIET:  We do recommend a small meal at first, but then you may proceed to your regular diet.  Drink plenty of fluids but you should avoid alcoholic beverages for 24 hours.  ACTIVITY:  You should plan  to take it easy for the rest of today and you should NOT DRIVE or use heavy machinery until tomorrow (because of the sedation medicines used during the test).    FOLLOW UP: Our staff will call the number listed on your records 48-72 hours following your procedure to check on you and address any questions or concerns that you may have regarding the information given to you following your procedure. If we do not reach you, we will leave a message.  We will attempt to reach you two times.  During this call, we will ask if you have developed any symptoms of COVID 19. If you develop any symptoms (ie: fever, flu-like symptoms, shortness of breath, cough etc.) before then, please call [phone].  If you test positive for Covid 19 in the 2 weeks post procedure, please call and report this information to [place].    If any biopsies were taken you will be contacted by phone or by letter within the next 1-3 weeks.  Please call us at [phone] if you have not heard about the biopsies in 3 weeks.    SIGNATURES/CONFIDENTIALITY: You and/or your care partner have signed paperwork which will be entered into your electronic medical record.  These signatures attest to the fact that that the information above on your After Visit Summary has been reviewed and is understood.  Full responsibility of the confidentiality of this discharge information lies with you and/or your care-partner.

## 2020-07-17 NOTE — Progress Notes (Signed)
Pt's states no medical or surgical changes since previsit or office visit.  HC - vitALS

## 2020-07-17 NOTE — Telephone Encounter (Signed)
I spoke with patient this morning regarding Recticare cream.  This it not covered by insurance due to this being an over-the-counter medication.  I asked patient if he had access to print off a coupon and he stated that he does.  I told him this will help with cost of medication that he may purchase at [facility] or other pharmacies.  Patient was instructed to use as directed.Patient verbalized understanding.

## 2020-07-17 NOTE — Op Note (Signed)
[place] Patient Name: Shane [name] Procedure Date: 07/17/2020 7:53 AM MRN: [id number] Endoscopist: Gerrit Heck , MD Age: 69 Referring MD:  Date of Birth: 02-Jun-1951 Gender: Male Account #: [id number] Procedure:                Flexible Sigmoidoscopy Indications:              Hematochezia, Abnormal CT of the GI tract, Abnormal                            MRI of the GI tract Medicines:                Monitored Anesthesia Care Procedure:                Pre-Anesthesia Assessment:                           - Prior to the procedure, a History and Physical                            was performed, and patient medications and                            allergies were reviewed. The patient's tolerance of                            previous anesthesia was also reviewed. The risks                            and benefits of the procedure and the sedation                            options and risks were discussed with the patient.                            All questions were answered, and informed consent                            was obtained. Prior Anticoagulants: The patient has                            taken no previous anticoagulant or antiplatelet                            agents. ASA Grade Assessment: II - A patient with                            mild systemic disease. After reviewing the risks                            and benefits, the patient was deemed in                            satisfactory condition to undergo the procedure.  Preperations were made for colonoscopy, but due to                            an obstructing lesion in the rectosigmoid colon and                            poor bowel preparation, unable to advance beyond                            this juncture, and therefore this was a                            sigmoidoscopy. The Colonoscope was introduced                            through the anus and advanced to the the                             rectosigmoid junction. The flexible sigmoidoscopy                            was technically difficult and complex due to a                            partially obstructing mass. The quality of the                            bowel preparation was poor. Scope In: 8:05:17 AM Scope Out: 8:25:21 AM Total Procedure Duration: 0 hours 20 minutes 4 seconds  Findings:                 Large, prolapsed hemorrhoids were found on perianal                            exam.                           A frond-like/villous and infiltrative completely                            obstructing large mass was found in the                            recto-sigmoid colon, located 23 cm from the anal                            verge. The mass was circumferential and                            non-traversable. Oozing was present. This was                            biopsied with a cold forceps for histology.  Estimated blood loss was minimal.                           A frond-like/villous partially obstructing mass was                            found in the rectum, located 12 cm from the anal                            verge. The mass was non-circumferential. The mass                            measured three cm in length. Oozing was present.                            This was biopsied with a cold forceps for                            histology. Estimated blood loss was minimal.                           Internal hemorrhoids with ulcerated mucosa were                            found. The hemorrhoids were large.                           Retroflexion in the rectum was not performed due to                            anatomy. Complications:            No immediate complications. Estimated Blood Loss:     Estimated blood loss was minimal. Impression:               - Preparation of the colon was poor.                           - Hemorrhoids found on perianal  exam.                           - Malignant completely obstructing tumor in the                            recto-sigmoid colon. Biopsied.                           - Malignant partially obstructing tumor in the                            rectum. Biopsied.                           - Internal hemorrhoids.                           -  Endoscopic apperance consistent with recent MRI                            finding of mass in the rectum that appears to                            communicate with adjacent loop of distal sigmoid                            colon. The lumen is quite fixed, and combined with                            the obstructing mass in the recto-sigmoid, was                            unable to traverse this area for a more proximal                            examination. Recommendation:           - Discharge patient to home (with escort).                           - Await pathology results.                           - Appointment with Dr. Marcello Moores in the [facility] next week as previously scheduled. Gerrit Heck, MD 07/17/2020 8:44:33 AM

## 2020-07-18 ENCOUNTER — Inpatient Hospital Stay: Payer: 59 | Attending: Physician Assistant | Admitting: Oncology

## 2020-07-18 ENCOUNTER — Encounter: Payer: Self-pay | Admitting: Radiation Oncology

## 2020-07-18 ENCOUNTER — Ambulatory Visit
Admission: RE | Admit: 2020-07-18 | Discharge: 2020-07-18 | Disposition: A | Discharge status: Home / Self Care | Payer: 59 | Source: Ambulatory Visit | Attending: Oncology | Admitting: Oncology

## 2020-07-18 ENCOUNTER — Ambulatory Visit
Admission: RE | Admit: 2020-07-18 | Discharge: 2020-07-18 | Disposition: A | Discharge status: Home / Self Care | Payer: 59 | Source: Ambulatory Visit | Attending: Radiation Oncology | Admitting: Radiation Oncology

## 2020-07-18 ENCOUNTER — Other Ambulatory Visit: Payer: Self-pay

## 2020-07-18 ENCOUNTER — Encounter: Payer: Self-pay | Admitting: *Deleted

## 2020-07-18 VITALS — BP 123/59 | HR 89 | Temp 97.5°F | Resp 17 | Ht 70.0 in | Wt 193.3 lb

## 2020-07-18 DIAGNOSIS — Z452 Encounter for adjustment and management of vascular access device: Secondary | ICD-10-CM | POA: Insufficient documentation

## 2020-07-18 DIAGNOSIS — K5900 Constipation, unspecified: Secondary | ICD-10-CM | POA: Insufficient documentation

## 2020-07-18 DIAGNOSIS — R911 Solitary pulmonary nodule: Secondary | ICD-10-CM | POA: Diagnosis not present

## 2020-07-18 DIAGNOSIS — C20 Malignant neoplasm of rectum: Secondary | ICD-10-CM

## 2020-07-18 DIAGNOSIS — C775 Secondary and unspecified malignant neoplasm of intrapelvic lymph nodes: Secondary | ICD-10-CM | POA: Insufficient documentation

## 2020-07-18 DIAGNOSIS — I700 Atherosclerosis of aorta: Secondary | ICD-10-CM | POA: Insufficient documentation

## 2020-07-18 DIAGNOSIS — Z5111 Encounter for antineoplastic chemotherapy: Secondary | ICD-10-CM | POA: Diagnosis present

## 2020-07-18 DIAGNOSIS — R634 Abnormal weight loss: Secondary | ICD-10-CM | POA: Diagnosis not present

## 2020-07-18 DIAGNOSIS — D649 Anemia, unspecified: Secondary | ICD-10-CM | POA: Insufficient documentation

## 2020-07-18 DIAGNOSIS — Z87891 Personal history of nicotine dependence: Secondary | ICD-10-CM | POA: Diagnosis not present

## 2020-07-18 DIAGNOSIS — K6289 Other specified diseases of anus and rectum: Secondary | ICD-10-CM

## 2020-07-18 DIAGNOSIS — K648 Other hemorrhoids: Secondary | ICD-10-CM | POA: Diagnosis not present

## 2020-07-18 DIAGNOSIS — D509 Iron deficiency anemia, unspecified: Secondary | ICD-10-CM | POA: Diagnosis not present

## 2020-07-18 DIAGNOSIS — M1990 Unspecified osteoarthritis, unspecified site: Secondary | ICD-10-CM | POA: Diagnosis not present

## 2020-07-18 DIAGNOSIS — Z79899 Other long term (current) drug therapy: Secondary | ICD-10-CM | POA: Insufficient documentation

## 2020-07-18 DIAGNOSIS — J439 Emphysema, unspecified: Secondary | ICD-10-CM | POA: Insufficient documentation

## 2020-07-18 DIAGNOSIS — Z85048 Personal history of other malignant neoplasm of rectum, rectosigmoid junction, and anus: Secondary | ICD-10-CM | POA: Insufficient documentation

## 2020-07-18 MED ORDER — HYDROCODONE-ACETAMINOPHEN 5-325 MG PO TABS: 1.0000 | ORAL_TABLET | Freq: Four times a day (QID) | ORAL | 0 refills | Status: DC | PRN

## 2020-07-18 NOTE — Progress Notes (Signed)
Radiation Oncology         ([phone]) [phone] ________________________________  Name: Shane [name]        MRN: [id number]  Date of Service: 07/18/2020 DOB: October 18, 1951  YT:KPTWSF, Shane Repress, FNP  Ladell Pier, MD     REFERRING PHYSICIAN: Ladell Pier, MD   DIAGNOSIS: The encounter diagnosis was Rectal carcinoma [facility]).   HISTORY OF PRESENT ILLNESS: Shane [name] is a 69 y.o. male seen at the request of Dr. Benay Spice for a newly diagnosed mass in the rectum. The patient was found to be anemic and had had unintended weight loss.  He was seen by his PCP and underwent CT abdomen pelvis with contrast on 06/27/2020. This revealed concerns for a 7.5 x 6.1 x 5 cm mass in the rectum, he had large bilateral hydroceles left greater than right, mild prostatic enlargement, and small bilateral inguinal hernias containing fat.  Given these findings he was referred for further evaluation by oncology, he underwent CT chest with pelvis on 07/12/2020 that revealed small multiple nodules throughout the lungs the largest was 3 mm in the left pulmonary apex that were considered nonspecific, emphysematous and atherosclerotic disease was also seen.  An MRI of the pelvis was performed on 07/12/2020, but was not performed with rectal contrast protocol, it was read as an advanced cancer measuring up to 3.4 cm with bulky disease extending through the anterior and right lateral rectal wall invading an adjacent loop of distal sigmoid colon, and involving the right seminal vesicle, this was felt to be T4, no nodes were seen but this was significantly limited to the large size of the tumor, and the distance of the tumor to the internal anal sphincter was 6.6 cm.  He underwent sigmoidoscopy yesterday, and pathology is pending but there was a partially obstructing mass seen in the rectum that was biopsied.  It is expected to be an adenocarcinoma.  He met with Dr. Benay Spice and discussed the rationale for total neoadjuvant chemotherapy,  as well as chemoradiation to follow and subsequent surgical resection.  The patient has been found to have a CEA of 6.76, and is being referred to colorectal surgery.  He is seen today to discuss the rationale of chemoradiation at the appropriate interval.    PREVIOUS RADIATION THERAPY: No   PAST MEDICAL HISTORY:  Past Medical History:  Diagnosis Date  . Allergy   . Arthritis        PAST SURGICAL HISTORY:History reviewed. No pertinent surgical history.   FAMILY HISTORY:  Family History  Problem Relation Age of Onset  . Healthy Mother   . Hypertension Father   . Stroke Father   . Colon cancer Neg Hx   . Esophageal cancer Neg Hx   . Stomach cancer Neg Hx   . Rectal cancer Neg Hx      SOCIAL HISTORY:  reports that he quit smoking about 41 years ago. His smoking use included cigarettes. He has a 10.00 pack-year smoking history. He has never used smokeless tobacco. He reports that he does not drink alcohol and does not use drugs.  The patient is married and lives in [place]. The patient is married and lives in [place]. He spends time with his family and enjoys taking his 55 year old granddaughter to gymnastics. He is also very active in his church.  ALLERGIES: Patient has no known allergies.   MEDICATIONS:  Current Outpatient Medications  Medication Sig Dispense Refill  . diclofenac (VOLTAREN) 75 MG EC tablet Take 1 tablet (  75 mg total) by mouth 2 (two) times daily. 180 tablet 1  . Diclofenac Sodium 2 % SOLN Place 2 g onto the skin 2 (two) times daily. 112 g 3  . docusate sodium (COLACE) 100 MG capsule Take 100 mg by mouth daily as needed for mild constipation.    Marland Kitchen HYDROcodone-acetaminophen (NORCO/VICODIN) 5-325 MG tablet Take 1 tablet by mouth every 8 (eight) hours as needed. 6 tablet 0  . hydrocortisone (ANUSOL-HC) 2.5 % rectal cream Place 1 application rectally 2 (two) times daily. 30 g 0  . naproxen (NAPROSYN) 500 MG tablet Take 1 tablet (500 mg total) by mouth 2  (two) times daily with a meal. 30 tablet 3  . polyethylene glycol (MIRALAX / GLYCOLAX) 17 g packet Take 17 g by mouth daily.    . Polysaccharide Iron Complex (FERREX 150 PO) Take 1 tablet by mouth in the morning and at bedtime.    . pravastatin (PRAVACHOL) 20 MG tablet TAKE 1 TABLET(20 MG) BY MOUTH DAILY 90 tablet 3  . DULoxetine (CYMBALTA) 30 MG capsule Take 1 capsule (30 mg total) by mouth daily. (Patient not taking: Reported on 07/18/2020) 30 capsule 3   No current facility-administered medications for this encounter.     REVIEW OF SYSTEMS: On review of systems, the patient reports that he is doing well overall. He reports he is able to pass stool with taking colace and miralax regularly. He does have pain with sitting for long periods of time, and some pressure in the pelvis. As well as pain in his rectum along a site with hemorrhoids. He has seen bloody streaks on the toilet paper at times. He  denies any chest pain, shortness of breath, cough, fevers, chills, night sweats. He has had 3-4 pounds of weight loss in the last 6 weeks or so. He  denies abdominal pain, nausea or vomiting. He denies any new musculoskeletal or joint aches or pains. A complete review of systems is obtained and is otherwise negative.     PHYSICAL EXAM:  Wt Readings from Last 3 Encounters:  07/18/20 194 lb (88 kg)  07/18/20 193 lb 4.8 oz (87.7 kg)  07/17/20 195 lb (88.5 kg)   Temp Readings from Last 3 Encounters:  07/18/20 98.4 F (36.9 C)  07/18/20 (!) 97.5 F (36.4 C) (Tympanic)  07/17/20 (!) 96.9 F (36.1 C) (Temporal)   BP Readings from Last 3 Encounters:  07/18/20 129/64  07/18/20 (!) 123/59  07/17/20 130/77   Pulse Readings from Last 3 Encounters:  07/18/20 89  07/18/20 89  07/17/20 88   Pain Assessment Pain Score: 2  Pain Loc: Buttocks (Having some pain from his hemorrhoids)/10  In general this is a well appearing African American male in no acute distress. He's alert and oriented x4 and  appropriate throughout the examination. Cardiopulmonary assessment is negative for acute distress and he exhibits normal effort.    ECOG = 1  0 - Asymptomatic (Fully active, able to carry on all predisease activities without restriction)  1 - Symptomatic but completely ambulatory (Restricted in physically strenuous activity but ambulatory and able to carry out work of a light or sedentary nature. For example, light housework, office work)  2 - Symptomatic, <50% in bed during the day (Ambulatory and capable of all self care but unable to carry out any work activities. Up and about more than 50% of waking hours)  3 - Symptomatic, >50% in bed, but not bedbound (Capable of only limited self-care, confined to bed or  chair 50% or more of waking hours)  4 - Bedbound (Completely disabled. Cannot carry on any self-care. Totally confined to bed or chair)  5 - Death   Eustace Pen MM, Creech RH, Tormey DC, et al. [phone]). "Toxicity and response criteria of the [facility]". [place] Oncol. 5 (6): 649-55    LABORATORY DATA:  Lab Results  Component Value Date   WBC 18.2 (H) 07/15/2020   HGB 8.7 (L) 07/15/2020   HCT 27.0 (L) 07/15/2020   MCV 71.1 (L) 07/15/2020   PLT 426 (H) 07/15/2020   Lab Results  Component Value Date   NA 136 07/15/2020   K 3.7 07/15/2020   CL 106 07/15/2020   CO2 21 (L) 07/15/2020   Lab Results  Component Value Date   ALT 14 07/04/2020   AST 16 07/04/2020   ALKPHOS 93 07/04/2020   BILITOT 0.4 07/04/2020      RADIOGRAPHY: CT Chest W Contrast  Result Date: 07/12/2020 CLINICAL DATA:  Presumed rectal cancer, staging EXAM: CT CHEST WITH CONTRAST TECHNIQUE: Multidetector CT imaging of the chest was performed during intravenous contrast administration. CONTRAST:  84m OMNIPAQUE IOHEXOL 300 MG/ML  SOLN COMPARISON:  None. FINDINGS: Cardiovascular: Aortic atherosclerosis. Normal heart size. No pericardial effusion. Mediastinum/Nodes: No enlarged  mediastinal, hilar, or axillary lymph nodes. 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 Thyroid gland and trachea demonstrate no significant findings. Lungs/Pleura: Mild, predominantly paraseptal emphysema. There are multiple small pulmonary nodules throughout the lungs, the largest measuring 3 mm in the left pulmonary apex (series 7, image 25). Mild scarring and or atelectasis of the bilateral lung bases no pleural effusion or pneumothorax. Upper Abdomen: No acute abnormality. Musculoskeletal: No chest wall mass or suspicious bone lesions identified. IMPRESSION: 1. There are multiple small pulmonary nodules throughout the lungs, the largest measuring 3 mm in the left pulmonary apex. These are nonspecific and indeterminate for metastatic disease, although suspicious given locally advanced malignancy in the pelvis. Attention on follow-up. 2. Emphysema (ICD10-J43.9). 3. Aortic Atherosclerosis (ICD10-I70.0). Electronically Signed   By: AEddie CandleM.D.   On: 07/12/2020 09:07   MR PELVIS WO CONTRAST  Result Date: 07/12/2020 CLINICAL DATA:  Rectal mass.  Presumed rectal carcinoma. EXAM: MRI PELVIS WITHOUT CONTRAST TECHNIQUE: Multiplanar multisequence MR imaging of the pelvis was performed. No intravenous contrast was administered. Small amount of UKoreagel was administered per rectum to optimize tumor evaluation. COMPARISON:  None. FINDINGS: TUMOR LOCATION Tumor distance from Anal Verge/Skin Surface:  11.4 cm Tumor distance to Internal Anal Sphincter: 6.6 cm TUMOR DESCRIPTION Circumferential Extent: 100% Tumor Length: 7.9 cm T - CATEGORY Extension through Muscularis Propria: yes, measuring up to 3.4 cm = T3d; bulky mass in the upper rectum extends through the anterior and right lateral rectal wall, and directly involves an adjacent loop of distal sigmoid colon Shortest Distance of any tumor/node from Mesorectal Fascia: 0 mm; contacts the right lateral and  posterior mesorectal fascia Extramural Vascular Invasion/Tumor Thrombus: No Invasion of Anterior Peritoneal Reflection: Yes = T4 Involvement of Adjacent Organs or Pelvic Sidewall: Yes; involvement of right seminal vesicle =T4 Levator Ani Involvement: No N - CATEGORY Mesorectal Lymph Nodes >=56m None=N0, note that assessment is significantly limited due to large size of the tumor Extra-mesorectal Lymphadenopathy: No Other:  Large bilateral scrotal hydroceles noted. IMPRESSION: Assuming rectal adenocarcinoma, T stage: T4 Assuming rectal adenocarcinoma, N stage: N0, although assessment is limited by bulky size of tumor Distance from tumor to  the internal anal sphincter is 6.6 cm. Electronically Signed   By: Danae Orleans M.D.   On: 07/12/2020 13:51   CT Abdomen Pelvis W Contrast  Result Date: 06/27/2020 CLINICAL DATA:  Anemia, unintentional weight loss, low ferritin, recent elevated blood pressure, fatigue EXAM: CT ABDOMEN AND PELVIS WITH CONTRAST TECHNIQUE: Multidetector CT imaging of the abdomen and pelvis was performed using the standard protocol following bolus administration of intravenous contrast. Sagittal and coronal MPR images reconstructed from axial data set. CONTRAST:  OMNIPAQUE IOHEXOL 300 MG/ML SOLN IV. Dilute oral contrast. COMPARISON:  None FINDINGS: Lower chest: Few tiny bibasilar pulmonary nodules largest 3 mm LEFT lower lobe image 5. Hepatobiliary: Gallbladder unremarkable. Very minimal central intrahepatic biliary dilatation. No focal hepatic mass lesion. Pancreas: Normal appearance Spleen: Normal appearance Adrenals/Urinary Tract: Adrenal glands normal appearance. BILATERAL peripelvic renal cysts, largest cyst 2.9 x 2.3 cm inferior pole RIGHT kidney. No additional renal mass or calcification. Ureters and bladder unremarkable. Stomach/Bowel: Increased stool throughout colon, which appears distended small bowel loops unremarkable. Stomach normal appearance. Abnormal appearance of the rectum  which demonstrates irregular thickening and infiltration of perirectal fat planes highly suspicious for a rectal neoplasm. Stranding extends from the perirectal fat into presacral fat. Visualize mass is questionably 7.5 x 6.1 x 5.0 cm. Vascular/Lymphatic: No adenopathy. Atherosclerotic calcifications aorta and iliac arteries with thought aneurysm. Reproductive: Large BILATERAL hydroceles, LEFT greater than RIGHT. Mild prostatic enlargement. Other: Minimal free fluid in pelvis. No free air. Question small BILATERAL hernias containing fat. Musculoskeletal: Small nonspecific sclerotic focus LEFT inferior pubic ramus. Retrolisthesis L5-S1. Minimal fluid RIGHT hip joint and questionably a RIGHT iliopsoas muscle distally. IMPRESSION: Abnormal appearance of the rectum which demonstrates irregular masslike thickening highly suspicious for a rectal neoplasm measuring approximately 7.5 x 6.1 x 5.0 cm; recommend correlation with colonoscopy. Increased stool throughout colon suspect component of distal colonic obstruction related to above rectal neoplasm. Perirectal infiltration extending into presacral space question local tumor extension. Large BILATERAL hydroceles, LEFT greater than RIGHT. Few tiny bibasilar pulmonary nodules largest 3 mm LEFT lower lobe, nonspecific; in this patient with a rectal tumor, recommend follow-up CT chest to assess. Aortic Atherosclerosis (ICD10-I70.0). Findings called to Dr. Jonny Ruiz on 06/27/2020 at 1514 hrs. Electronically Signed   By: Ulyses Southward M.D.   On: 06/27/2020 15:18       IMPRESSION/PLAN: 1. At least Stage IIB, [id number], adenocarcinoma of the rectum. Dr. Mitzi Hansen discusses the work up and discusses that we anticipate a diagnosis of adenocarcinoma from pathology findings. Dr. Mitzi Hansen reviews the nature of locally advanced adenocarcinoma of the rectum. Dr. Mitzi Hansen discusses the rationale to proceed with neoadjuvant therapy, and Dr. Truett Perna anticipates total neoadjuvant chemotherapy, followed  by chemoRT, then surgical resection. He will meet with Dr. Cliffton Asters next Monday and is aware that there may be a need for diversion surgery if he's unable to keep having bowel movements. We discussed the risks, benefits, short, and long term effects of radiotherapy, and the patient is interested in proceeding several months from now following the completion of his total neoadjuvant chemotherapy regimen with Dr. Truett Perna plans on with FOLFOX. Dr. Mitzi Hansen discusses the delivery and logistics of radiotherapy and anticipates a course of 5 1/2 weeks of radiotherapy to the rectum and pelvic nodes. We will see him back in about 4 months following completion of his chemotherapy to then pursue chemoRT.  2. Bowel movements. I encouraged him to keep a regular bowel regimen with miralax and colace, but also suggested he try milk of  magnesia if he feels constipated. He is in agreement and knows to call Dr. Sherrill/Dr. Dema Severin if he is not having stools every day.  In a visit lasting 60 minutes, greater than 50% of the time was spent face to face discussing the patient's condition, in preparation for the discussion, and coordinating the patient's care.   The above documentation reflects my direct findings during this shared patient visit. Please see the separate note by Dr. Lisbeth Renshaw on this date for the remainder of the patient's plan of care.    Carola Rhine, PAC

## 2020-07-18 NOTE — Progress Notes (Signed)
START ON PATHWAY REGIMEN - Colorectal     A cycle is every 14 days:     Oxaliplatin      Leucovorin      Fluorouracil      Fluorouracil   **Always confirm dose/schedule in your pharmacy ordering system**  Patient Characteristics: Preoperative or Nonsurgical Candidate (Clinical Staging), Rectal, cT3 - cT4, cN0 or Any cT, cN+ Tumor Location: Rectal Therapeutic Status: Preoperative or Nonsurgical Candidate (Clinical Staging) AJCC T Category: Staged < 8th Ed. AJCC N Category: Staged < 8th Ed. AJCC M Category: Staged < 8th Ed. AJCC 8 Stage Grouping: Staged < 8th Ed. Intent of Therapy: Curative Intent, Discussed with Patient 

## 2020-07-18 NOTE — Progress Notes (Signed)
Spoke with patient to let know that surgeon will not be able to place port however IR at [facility] can placed on Thursday 9/16 need to arrive by 7:30 am in Admitting at [facility], must have a driver and NPO past midnight.  He verbalized an understanding.

## 2020-07-18 NOTE — Progress Notes (Signed)
[place] OFFICE PROGRESS NOTE   Diagnosis: Rectal cancer  INTERVAL HISTORY:   [name] returns for a scheduled visit.  He reports having bowel movements since he started the laxative regimen 07/04/2020.  He underwent an MRI of the pelvis on 07/12/2020.  He developed severe "hemorrhoid "pain following this procedure and was seen in the emergency room on 07/15/2020.  He was found to have engorged external hemorrhoids with a minimal amount of rectal prolapse.  This was reduced by Dr. Bobbye Morton in the emergency room.  He was prescribed Anusol cream and continues a stool softener regimen.  He reports partial improvement in the pain.  He requests a refill on hydrocodone.  [name] underwent a colonoscopy 07/17/2020.  Large prolapsed hemorrhoids were found.  A completely obstructing mass was found at the rectosigmoid, 23 cm from the anal verge.  Oozing was present.  The mass was biopsied.  A partially obstructing mass was found in the rectum at 12 cm from the anal verge.  The mass was biopsied.  Oozing was present.  Large internal hemorrhoids were noted.  The endoscopic appearance was consistent with a rectal mass communicating with an adjacent loop of distal sigmoid colon.  The proximal mass could not be traversed. The pathology is pending.  Objective:  Vital signs in last 24 hours:  Blood pressure (!) 123/59, pulse 89, temperature (!) 97.5 F (36.4 C), temperature source Tympanic, resp. rate 17, height 5\' 10"  (1.778 m), weight 193 lb 4.8 oz (87.7 kg), SpO2 100 %.    Physical examination not performed today, he declined rectal examination  Lab Results:  Lab Results  Component Value Date   WBC 18.2 (H) 07/15/2020   HGB 8.7 (L) 07/15/2020   HCT 27.0 (L) 07/15/2020   MCV 71.1 (L) 07/15/2020   PLT 426 (H) 07/15/2020   NEUTROABS 15.3 (H) 07/15/2020    CMP  Lab Results  Component Value Date   NA 136 07/15/2020   K 3.7 07/15/2020   CL 106 07/15/2020   CO2 21 (L) 07/15/2020    GLUCOSE 100 (H) 07/15/2020   BUN 21 07/15/2020   CREATININE 0.94 07/15/2020   CALCIUM 9.2 07/15/2020   PROT 7.7 07/04/2020   ALBUMIN 3.2 (L) 07/04/2020   AST 16 07/04/2020   ALT 14 07/04/2020   ALKPHOS 93 07/04/2020   BILITOT 0.4 07/04/2020   GFRNONAA >60 07/15/2020   GFRAA >60 07/15/2020    Lab Results  Component Value Date   CEA1 6.76 (H) 07/04/2020     Imaging: CT 06/27/2020 and MRI 07/12/2020-images reviewed with [name]  Medications: I have reviewed the patient's current medications.   Assessment/Plan:    Disposition: 1.  Rectal cancer  CT abdomen/pelvis 06/27/2020-irregular masslike thickening of the rectum, increased colonic stool, perirectal infiltration into the presacral space, tiny bibasilar pulmonary nodules  CT chest 07/12/2020-multiple small pulmonary nodules, largest 3 mm in the left apex  MRI pelvis 07/12/2020-tumor at 11.4 centimeters from the anal verge, 6.6 cm from the internal sphincter, tumor extends through the right lateral rectal wall and directly involves an adjacent loop of distal sigmoid colon with invasion of anterior peritoneal reflection and involvement of the right seminal vesicle, no lymphadenopathy.  T4N0  Colonoscopy 07/17/2020-mass at 12 cm from the anal verge, completely obstructing mass at 23 cm from the anal verge, large internal hemorrhoids  2.  Iron deficiency anemia secondary to #1 3.  Hemorrhoids 4.  Rectal pain secondary to hemorrhoids versus the primary rectal tumor  5.  Constipation secondary to #1, improved with a stool softener and laxative regimen 6.  Small indeterminate pulmonary nodules 7.  Arthritis  [name] has been diagnosed with rectal cancer.  The final pathology from the colonoscopy yesterday is pending.  He appears to have a locally advanced tumor causing partial bowel obstruction.  There are indeterminate pulmonary nodules.  His case was presented at the GI tumor conference earlier this week.  He is scheduled  to be seen in radiation oncology later today and has a surgical appointment next week.  I discussed treatment options with [name].  He appears to be a candidate for total neoadjuvant therapy.  I recommend FOLFOX chemotherapy to be followed by capecitabine and radiation.  We reviewed potential toxicities associated with the FOLFOX regimen including the chance for nausea/vomiting, mucositis, diarrhea, alopecia, and hematologic toxicity.  We discussed the sun sensitivity, hyperpigmentation, and hand/foot syndrome seen with 5-fluorouracil.  We discussed the allergic reaction and various types of neuropathy associated with oxaliplatin.  He agrees to proceed.  He will attend a chemotherapy teaching class.  [name] will be referred for placement of a Port-A-Cath.  We will reevaluate the lung nodules at the completion of neoadjuvant therapy.  I will refill his hydrocodone to use as needed for pain.  I encouraged him to continue the MiraLAX and stool softener.  He understands that he may require a diverting colostomy if he develops increased obstructive symptoms.  [name] will be scheduled for an office visit and cycle 1 FOLFOX on 07/30/2020.  Betsy Coder, MD  07/18/2020  2:31 PM

## 2020-07-19 ENCOUNTER — Telehealth: Payer: Self-pay

## 2020-07-19 ENCOUNTER — Telehealth: Payer: Self-pay | Admitting: Oncology

## 2020-07-19 ENCOUNTER — Encounter: Payer: Self-pay | Admitting: *Deleted

## 2020-07-19 NOTE — Progress Notes (Signed)
Patient calls with complain of hemorrhoidal pain.  He is using Anusol cream every time he goes to the bathroom.  Has not tried sitz baths.  I instructed him to get one from the pharmacy and to soak 4 to 5 times daily that this will help with the discomfort.  Also that Dr. Benay Spice refilled his Hydrocodone and it should be at [facility] ready for him.  I explained that the sooner he starts treatment this will help shrink the rectal mass and relieve some of the pressure thus helping decrease hemorrhoidal swelling.  He verbalized an understanding and states he will get a sitz bath.

## 2020-07-19 NOTE — Telephone Encounter (Signed)
Left message on answering machine. 

## 2020-07-19 NOTE — Telephone Encounter (Signed)
Scheduled appointments per 9/9 los. Left message for patient with appointments details.

## 2020-07-19 NOTE — Progress Notes (Signed)
[place] Work  Clinical Social Work was referred by Pension scheme manager for assessment of psychosocial needs.  Clinical Social Worker contacted patient by phone  to offer support and assess for needs.  Mr. [name] reported his only concerned at this time I related to Hemmorrhoid pain.  He indicated he has recently spoked to his RN navigator and discussed ways to address the pain.  He shared the process of diagnosis and beginning treatment has gone very fast, but he has no concerns.  Patient lives with spouse and has two daughters.  CSW discussed ways to cope with pain and concept of relying on others when he typically has not had to do so in the past.  CSW provided information on [facility] support services including the GI support group. CSW provided CSW [place] contact information and encouraged patient to call as needed.  ONCBCN DISTRESS SCREENING 07/18/2020  Screening Type Initial Screening  Distress experienced in past week (1-10) 9  Physical Problem type Constipation/diarrhea  Other contact via [place], [place]

## 2020-07-19 NOTE — Telephone Encounter (Signed)
°  Follow up Call-  Call back number 07/17/2020  Post procedure Call Back phone  # [phone]  Permission to leave phone message Yes  Some recent data might be hidden     Patient questions:  Do you have a fever, pain , or abdominal swelling? No. Pain Score  0 *  Have you tolerated food without any problems? Yes.    Have you been able to return to your normal activities? Yes.    Do you have any questions about your discharge instructions: Diet   No. Medications  No. Follow up visit  No.  Do you have questions or concerns about your Care? No.  Actions: * If pain score is 4 or above: No action needed, pain <4. 1. Have you developed a fever since your procedure? no  2.   Have you had an respiratory symptoms (SOB or cough) since your procedure? no  3.   Have you tested positive for COVID 19 since your procedure no  4.   Have you had any family members/close contacts diagnosed with the COVID 19 since your procedure?  no   If yes to any of these questions please route to Joylene John, RN and Joella Prince, RN

## 2020-07-22 ENCOUNTER — Encounter: Payer: Self-pay | Admitting: Orthopaedic Surgery

## 2020-07-23 ENCOUNTER — Other Ambulatory Visit: Payer: Self-pay

## 2020-07-23 ENCOUNTER — Inpatient Hospital Stay: Payer: 59

## 2020-07-23 ENCOUNTER — Other Ambulatory Visit: Payer: 59

## 2020-07-24 ENCOUNTER — Other Ambulatory Visit: Payer: Self-pay | Admitting: Radiology

## 2020-07-24 NOTE — Progress Notes (Signed)
Pharmacist Chemotherapy Monitoring - Initial Assessment    Anticipated start date: 07/30/20    Regimen:   Are orders appropriate based on the patients diagnosis, regimen, and cycle? Yes  Does the plan date match the patients scheduled date? Yes  Is the sequencing of drugs appropriate? Yes  Are the premedications appropriate for the patients regimen? Yes  Prior Authorization for treatment is: Approved o If applicable, is the correct biosimilar selected based on the patient's insurance? not applicable  Organ Function and Labs:  Are dose adjustments needed based on the patient's renal function, hepatic function, or hematologic function? No  Are appropriate labs ordered prior to the start of patient's treatment? Yes  Other organ system assessment, if indicated: N/A  The following baseline labs, if indicated, have been ordered: N/A  Dose Assessment:  Are the drug doses appropriate? Yes  Are the following correct: o Drug concentrations Yes o IV fluid compatible with drug Yes o Administration routes Yes o Timing of therapy Yes  If applicable, does the patient have documented access for treatment and/or plans for port-a-cath placement? yes  If applicable, have lifetime cumulative doses been properly documented and assessed? not applicable Lifetime Dose Tracking  No doses have been documented on this patient for the following tracked chemicals: Doxorubicin, Epirubicin, Idarubicin, Daunorubicin, Mitoxantrone, Bleomycin, Oxaliplatin, Carboplatin, Liposomal Doxorubicin  o   Toxicity Monitoring/Prevention:  The patient has the following take home antiemetics prescribed: N/A  The patient has the following take home medications prescribed: N/A  Medication allergies and previous infusion related reactions, if applicable, have been reviewed and addressed. Yes  The patient's current medication list has been assessed for drug-drug interactions with their chemotherapy regimen. no  significant drug-drug interactions were identified on review.  Order Review:  Are the treatment plan orders signed? No  Is the patient scheduled to see a provider prior to their treatment? Yes  I verify that I have reviewed each item in the above checklist and answered each question accordingly.   Kennith Center, Pharm.D., CPP 07/24/2020@4 :53 PM

## 2020-07-25 ENCOUNTER — Encounter (HOSPITAL_COMMUNITY): Payer: Self-pay

## 2020-07-25 ENCOUNTER — Other Ambulatory Visit: Payer: Self-pay

## 2020-07-25 ENCOUNTER — Ambulatory Visit (HOSPITAL_COMMUNITY)
Admission: RE | Admit: 2020-07-25 | Discharge: 2020-07-25 | Disposition: A | Discharge status: Home / Self Care | Payer: 59 | Source: Ambulatory Visit | Attending: Oncology | Admitting: Oncology

## 2020-07-25 DIAGNOSIS — Z791 Long term (current) use of non-steroidal anti-inflammatories (NSAID): Secondary | ICD-10-CM | POA: Diagnosis not present

## 2020-07-25 DIAGNOSIS — Z79899 Other long term (current) drug therapy: Secondary | ICD-10-CM | POA: Diagnosis not present

## 2020-07-25 DIAGNOSIS — Z87891 Personal history of nicotine dependence: Secondary | ICD-10-CM | POA: Insufficient documentation

## 2020-07-25 DIAGNOSIS — M1990 Unspecified osteoarthritis, unspecified site: Secondary | ICD-10-CM | POA: Insufficient documentation

## 2020-07-25 DIAGNOSIS — C20 Malignant neoplasm of rectum: Secondary | ICD-10-CM | POA: Diagnosis not present

## 2020-07-25 DIAGNOSIS — K6289 Other specified diseases of anus and rectum: Secondary | ICD-10-CM

## 2020-07-25 HISTORY — PX: IR IMAGING GUIDED PORT INSERTION: IMG5740

## 2020-07-25 MED ORDER — FENTANYL CITRATE (PF) 100 MCG/2ML IJ SOLN
INTRAMUSCULAR | Status: AC | PRN
  Administered 2020-07-25: 50 ug via INTRAVENOUS

## 2020-07-25 MED ORDER — SODIUM CHLORIDE 0.9 % IV SOLN: INTRAVENOUS | Status: DC

## 2020-07-25 MED ORDER — CEFAZOLIN SODIUM-DEXTROSE 2-4 GM/100ML-% IV SOLN
INTRAVENOUS | Status: AC
  Administered 2020-07-25: 2 g via INTRAVENOUS
  Filled 2020-07-25: qty 100

## 2020-07-25 MED ORDER — CEFAZOLIN SODIUM-DEXTROSE 2-4 GM/100ML-% IV SOLN: 2.0000 g | INTRAVENOUS | Status: AC

## 2020-07-25 MED ORDER — CHLORHEXIDINE GLUCONATE 4 % EX LIQD
CUTANEOUS | Status: AC
  Filled 2020-07-25: qty 15

## 2020-07-25 MED ORDER — HEPARIN SOD (PORK) LOCK FLUSH 100 UNIT/ML IV SOLN
INTRAVENOUS | Status: AC
  Filled 2020-07-25: qty 5

## 2020-07-25 MED ORDER — LIDOCAINE HCL 1 % IJ SOLN
INTRAMUSCULAR | Status: AC
  Filled 2020-07-25: qty 20

## 2020-07-25 MED ORDER — FENTANYL CITRATE (PF) 100 MCG/2ML IJ SOLN
INTRAMUSCULAR | Status: AC
  Filled 2020-07-25: qty 2

## 2020-07-25 MED ORDER — SODIUM CHLORIDE 0.9 % IV SOLN
INTRAVENOUS | Status: AC | PRN
  Administered 2020-07-25: 10 mL/h via INTRAVENOUS

## 2020-07-25 MED ORDER — MIDAZOLAM HCL 2 MG/2ML IJ SOLN
INTRAMUSCULAR | Status: AC | PRN
  Administered 2020-07-25: 1 mg via INTRAVENOUS

## 2020-07-25 MED ORDER — LIDOCAINE HCL 1 % IJ SOLN
INTRAMUSCULAR | Status: AC | PRN
  Administered 2020-07-25: 20 mL via INTRADERMAL

## 2020-07-25 MED ORDER — MIDAZOLAM HCL 2 MG/2ML IJ SOLN
INTRAMUSCULAR | Status: AC
  Filled 2020-07-25: qty 2

## 2020-07-25 NOTE — H&P (Signed)
Chief Complaint: Rectal Cancer  Referring Physician(s): Betsy Coder  Supervising Physician: Corrie Mckusick  Patient Status: [facility] - Out-pt  History of Present Illness: Shane [name] is a 69 y.o. male who underwent a colonoscopy 07/17/2020 which showed a completely obstructing mass.  Biopsy done and pathology showed adenocarcinoma.  He is here today for placement of a tunneled catheter with port.  He is NPO. No nausea/vomiting. No Fever/chills. ROS negative.  No blood thinners.  Past Medical History:  Diagnosis Date  . Allergy   . Arthritis     History reviewed. No pertinent surgical history.  Allergies: Patient has no known allergies.  Medications: Prior to Admission medications   Medication Sig Start Date End Date Taking? Authorizing Provider  diclofenac (VOLTAREN) 75 MG EC tablet Take 1 tablet (75 mg total) by mouth 2 (two) times daily. 06/06/20  Yes Lyndal Pulley, DO  Diclofenac Sodium 2 % SOLN Place 2 g onto the skin 2 (two) times daily. 06/30/19  Yes Lyndal Pulley, DO  docusate sodium (COLACE) 100 MG capsule Take 100 mg by mouth daily as needed for mild constipation.   Yes [provider]  DULoxetine (CYMBALTA) 30 MG capsule Take 1 capsule (30 mg total) by mouth daily. 08/30/19  Yes Marrian Salvage, FNP  HYDROcodone-acetaminophen (NORCO/VICODIN) 5-325 MG tablet Take 1 tablet by mouth every 6 (six) hours as needed for moderate pain. 07/18/20  Yes Ladell Pier, MD  hydrocortisone (ANUSOL-HC) 2.5 % rectal cream Place 1 application rectally 2 (two) times daily. 06/17/20  Yes Marrian Salvage, FNP  naproxen (NAPROSYN) 500 MG tablet Take 1 tablet (500 mg total) by mouth 2 (two) times daily with a meal. 01/30/20  Yes Leandrew Koyanagi, MD  polyethylene glycol (MIRALAX / GLYCOLAX) 17 g packet Take 17 g by mouth daily.   Yes [provider]  Polysaccharide Iron Complex (FERREX 150 PO) Take 1 tablet by mouth in the morning and at bedtime.   Yes  [provider]  pravastatin (PRAVACHOL) 20 MG tablet TAKE 1 TABLET(20 MG) BY MOUTH DAILY 10/10/19  Yes Marrian Salvage, FNP     Family History  Problem Relation Age of Onset  . Healthy Mother   . Hypertension Father   . Stroke Father   . Colon cancer Neg Hx   . Esophageal cancer Neg Hx   . Stomach cancer Neg Hx   . Rectal cancer Neg Hx     Social History   Socioeconomic History  . Marital status: Married    Spouse name: Not on file  . Number of children: 2  . Years of education: 64  . Highest education level: Not on file  Occupational History  . Occupation: Armed forces technical officer  Tobacco Use  . Smoking status: Former Smoker    Packs/day: 1.00    Years: 10.00    Pack years: 10.00    Types: Cigarettes    Quit date: 08/09/1978    Years since quitting: 41.9  . Smokeless tobacco: Never Used  Vaping Use  . Vaping Use: Never used  Substance and Sexual Activity  . Alcohol use: No    Alcohol/week: 0.0 standard drinks  . Drug use: No  . Sexual activity: Not on file  Other Topics Concern  . 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 Difficulty of Paying  Living Expenses: Not on file  Food Insecurity:   . Worried About Charity fundraiser in the Last Year: Not on file  . Ran Out of Food in the Last Year: Not on file  Transportation Needs:   . Lack of Transportation (Medical): Not on file  . Lack of Transportation (Non-Medical): Not on file  Physical Activity:   . Days of Exercise per Week: Not on file  . Minutes of Exercise per Session: Not on file  Stress:   . Feeling of Stress : Not on file  Social Connections:   . Frequency of Communication with Friends and Family: Not on file  . Frequency of Social Gatherings with Friends and Family: Not on file  . Attends Religious Services: Not on file  . Active Member of Clubs or Organizations:  Not on file  . Attends Archivist Meetings: Not on file  . Marital Status: Not on file     Review of Systems: A 12 point ROS discussed and pertinent positives are indicated in the HPI above.  All other systems are negative.  Review of Systems  Vital Signs: BP (!) 143/63   Pulse 66   Temp 98.3 F (36.8 C) (Oral)   Resp 17   Ht 5\' 10"  (1.778 m)   Wt 88.5 kg   SpO2 100%   BMI 27.98 kg/m   Physical Exam Vitals reviewed.  Constitutional:      Appearance: Normal appearance.  HENT:     Head: Normocephalic and atraumatic.  Eyes:     Extraocular Movements: Extraocular movements intact.  Cardiovascular:     Rate and Rhythm: Normal rate and regular rhythm.  Pulmonary:     Effort: Pulmonary effort is normal. No respiratory distress.     Breath sounds: Normal breath sounds.  Abdominal:     General: There is no distension.     Palpations: Abdomen is soft.     Tenderness: There is no abdominal tenderness.  Musculoskeletal:        General: Normal range of motion.     Cervical back: Normal range of motion.  Skin:    General: Skin is warm and dry.  Neurological:     General: No focal deficit present.     Mental Status: He is alert and oriented to person, place, and time.  Psychiatric:        Mood and Affect: Mood normal.        Behavior: Behavior normal.        Thought Content: Thought content normal.        Judgment: Judgment normal.     Imaging: CT Chest W Contrast  Result Date: 07/12/2020 CLINICAL DATA:  Presumed rectal cancer, staging EXAM: CT CHEST WITH CONTRAST TECHNIQUE: Multidetector CT imaging of the chest was performed during intravenous contrast administration. CONTRAST:  40mL OMNIPAQUE IOHEXOL 300 MG/ML  SOLN COMPARISON:  None. FINDINGS: Cardiovascular: Aortic atherosclerosis. Normal heart size. No pericardial effusion. Mediastinum/Nodes: No enlarged mediastinal, hilar, or axillary lymph nodes. 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 Thyroid gland and trachea demonstrate no significant findings. Lungs/Pleura: Mild, predominantly paraseptal emphysema. There are multiple small pulmonary nodules throughout the lungs, the largest measuring 3 mm in the left pulmonary apex (series 7, image 25). Mild scarring and or atelectasis of the bilateral lung bases no pleural effusion or pneumothorax. Upper Abdomen: No acute abnormality. Musculoskeletal: No chest wall mass or suspicious bone lesions identified. IMPRESSION: 1. There  are multiple small pulmonary nodules throughout the lungs, the largest measuring 3 mm in the left pulmonary apex. These are nonspecific and indeterminate for metastatic disease, although suspicious given locally advanced malignancy in the pelvis. Attention on follow-up. 2. Emphysema (ICD10-J43.9). 3. Aortic Atherosclerosis (ICD10-I70.0). Electronically Signed   By: Eddie Candle M.D.   On: 07/12/2020 09:07   MR PELVIS WO CONTRAST  Result Date: 07/12/2020 CLINICAL DATA:  Rectal mass.  Presumed rectal carcinoma. EXAM: MRI PELVIS WITHOUT CONTRAST TECHNIQUE: Multiplanar multisequence MR imaging of the pelvis was performed. No intravenous contrast was administered. Small amount of [place] gel was administered per rectum to optimize tumor evaluation. COMPARISON:  None. FINDINGS: TUMOR LOCATION Tumor distance from Anal Verge/Skin Surface:  11.4 cm Tumor distance to Internal Anal Sphincter: 6.6 cm TUMOR DESCRIPTION Circumferential Extent: 100% Tumor Length: 7.9 cm T - CATEGORY Extension through Muscularis Propria: yes, measuring up to 3.4 cm = T3d; bulky mass in the upper rectum extends through the anterior and right lateral rectal wall, and directly involves an adjacent loop of distal sigmoid colon Shortest Distance of any tumor/node from Mesorectal Fascia: 0 mm; contacts the right lateral and posterior mesorectal fascia Extramural Vascular Invasion/Tumor Thrombus: No Invasion of Anterior Peritoneal  Reflection: Yes = T4 Involvement of Adjacent Organs or Pelvic Sidewall: Yes; involvement of right seminal vesicle =T4 Levator Ani Involvement: No N - CATEGORY Mesorectal Lymph Nodes >=48mm: None=N0, note that assessment is significantly limited due to large size of the tumor Extra-mesorectal Lymphadenopathy: No Other:  Large bilateral scrotal hydroceles noted. IMPRESSION: Assuming rectal adenocarcinoma, T stage: T4 Assuming rectal adenocarcinoma, N stage: N0, although assessment is limited by bulky size of tumor Distance from tumor to the internal anal sphincter is 6.6 cm. Electronically Signed   By: Marlaine Hind M.D.   On: 07/12/2020 13:51   CT Abdomen Pelvis W Contrast  Result Date: 06/27/2020 CLINICAL DATA:  Anemia, unintentional weight loss, low ferritin, recent elevated blood pressure, fatigue EXAM: CT ABDOMEN AND PELVIS WITH CONTRAST TECHNIQUE: Multidetector CT imaging of the abdomen and pelvis was performed using the standard protocol following bolus administration of intravenous contrast. Sagittal and coronal MPR images reconstructed from axial data set. CONTRAST:  1106mL OMNIPAQUE IOHEXOL 300 MG/ML SOLN IV. Dilute oral contrast. COMPARISON:  None FINDINGS: Lower chest: Few tiny bibasilar pulmonary nodules largest 3 mm LEFT lower lobe image 5. Hepatobiliary: Gallbladder unremarkable. Very minimal central intrahepatic biliary dilatation. No focal hepatic mass lesion. Pancreas: Normal appearance Spleen: Normal appearance Adrenals/Urinary Tract: Adrenal glands normal appearance. BILATERAL peripelvic renal cysts, largest cyst 2.9 x 2.3 cm inferior pole RIGHT kidney. No additional renal mass or calcification. Ureters and bladder unremarkable. Stomach/Bowel: Increased stool throughout colon, which appears distended small bowel loops unremarkable. Stomach normal appearance. Abnormal appearance of the rectum which demonstrates irregular thickening and infiltration of perirectal fat planes highly suspicious for a  rectal neoplasm. Stranding extends from the perirectal fat into presacral fat. Visualize mass is questionably 7.5 x 6.1 x 5.0 cm. Vascular/Lymphatic: No adenopathy. Atherosclerotic calcifications aorta and iliac arteries with thought aneurysm. Reproductive: Large BILATERAL hydroceles, LEFT greater than RIGHT. Mild prostatic enlargement. Other: Minimal free fluid in pelvis. No free air. Question small BILATERAL hernias containing fat. Musculoskeletal: Small nonspecific sclerotic focus LEFT inferior pubic ramus. Retrolisthesis L5-S1. Minimal fluid RIGHT hip joint and questionably a RIGHT iliopsoas muscle distally. IMPRESSION: Abnormal appearance of the rectum which demonstrates irregular masslike thickening highly suspicious for a rectal neoplasm measuring approximately 7.5 x 6.1 x 5.0 cm; recommend correlation with  colonoscopy. Increased stool throughout colon suspect component of distal colonic obstruction related to above rectal neoplasm. Perirectal infiltration extending into presacral space question local tumor extension. Large BILATERAL hydroceles, LEFT greater than RIGHT. Few tiny bibasilar pulmonary nodules largest 3 mm LEFT lower lobe, nonspecific; in this patient with a rectal tumor, recommend follow-up CT chest to assess. Aortic Atherosclerosis (ICD10-I70.0). Findings called to Dr. Jenny Reichmann on 06/27/2020 at 1514 hrs. Electronically Signed   By: Lavonia Dana M.D.   On: 06/27/2020 15:18    Labs:  CBC: Recent Labs    06/24/20 1417 06/25/20 1330 07/04/20 0845 07/15/20 0048  WBC 7.0 8.5 6.9 18.2*  HGB 9.7* 9.5* 9.3* 8.7*  HCT 32.8* 29.8* 29.2* 27.0*  PLT 512* 465.0* 464* 426*    COAGS: No results for input(s): INR, APTT in the last 8760 hours.  BMP: Recent Labs    08/30/19 1155 06/24/20 1417 07/04/20 0845 07/15/20 0048  NA 137 136 138 136  K 4.2 4.5 3.8 3.7  CL 107 104 108 106  CO2 23 25 25  21*  GLUCOSE 96 87 105* 100*  BUN 17 19 12 21   CALCIUM 9.2 9.7 9.9 9.2  CREATININE 0.96 0.97  0.96 0.94  GFRNONAA  --   --  >60 >60  GFRAA  --   --  >60 >60    LIVER FUNCTION TESTS: Recent Labs    08/30/19 1155 06/24/20 1417 07/04/20 0845  BILITOT 0.8 0.5 0.4  AST 18 16 16   ALT 17 15 14   ALKPHOS 131*  --  93  PROT 6.9 7.3 7.7  ALBUMIN 4.3  --  3.2*    TUMOR MARKERS: No results for input(s): AFPTM, CEA, CA199, CHROMGRNA in the last 8760 hours.  Assessment and Plan:  Adenocarcinoma of the rectum.  Will proceed with placement of a tunneled catheter with port today by Dr. Earleen Newport.  Risks and benefits of image guided port-a-catheter placement was discussed with the patient including, but not limited to bleeding, infection, pneumothorax, or fibrin sheath development and need for additional procedures.  All of the patient's questions were answered, patient is agreeable to proceed. Consent signed and in chart.  Thank you for this interesting consult.  I greatly enjoyed meeting Shane [name] and look forward to participating in their care.  A copy of this report was sent to the requesting provider on this date.  Electronically Signed: Murrell Redden, PA-C   07/25/2020, 8:07 AM      I spent a total of  30 Minutes in face to face in clinical consultation, greater than 50% of which was counseling/coordinating care for port a cath placement.

## 2020-07-25 NOTE — Discharge Instructions (Addendum)
Implanted Port Insertion, Care After This sheet gives you information about how to care for yourself after your procedure. Your health care provider may also give you more specific instructions. If you have problems or questions, contact your health care provider. What can I expect after the procedure? After the procedure, it is common to have:  Discomfort at the port insertion site.  Bruising on the skin over the port. This should improve over 3-4 days. Follow these instructions at home: Port care  After your port is placed, you will get a manufacturer's information card. The card has information about your port. Keep this card with you at all times.  Take care of the port as told by your health care provider. Ask your health care provider if you or a family member can get training for taking care of the port at home. A home health care nurse may also take care of the port.  Make sure to remember what type of port you have. Incision care      Follow instructions from your health care provider about how to take care of your port insertion site. Make sure you: ? Wash your hands with soap and water before and after you change your bandage (dressing). If soap and water are not available, use hand sanitizer. ? Change your dressing as told by your health care provider. ? Leave stitches (sutures), skin glue, or adhesive strips in place. These skin closures may need to stay in place for 2 weeks or longer. If adhesive strip edges start to loosen and curl up, you may trim the loose edges. Do not remove adhesive strips completely unless your health care provider tells you to do that.  Check your port insertion site every day for signs of infection. Check for: ? Redness, swelling, or pain. ? Fluid or blood. ? Warmth. ? Pus or a bad smell. Activity  Return to your normal activities as told by your health care provider. Ask your health care provider what activities are safe for you.  Do not  lift anything that is heavier than 10 lb (4.5 kg), or the limit that you are told, until your health care provider says that it is safe. General instructions  Take over-the-counter and prescription medicines only as told by your health care provider.  Do not take baths, swim, or use a hot tub until your health care provider approves. Ask your health care provider if you may take showers. You may only be allowed to take sponge baths.  Do not drive for 24 hours if you were given a sedative during your procedure.  Wear a medical alert bracelet in case of an emergency. This will tell any health care providers that you have a port.  Keep all follow-up visits as told by your health care provider. This is important. Contact a health care provider if:  You cannot flush your port with saline as directed, or you cannot draw blood from the port.  You have a fever or chills.  You have redness, swelling, or pain around your port insertion site.  You have fluid or blood coming from your port insertion site.  Your port insertion site feels warm to the touch.  You have pus or a bad smell coming from the port insertion site. Get help right away if:  You have chest pain or shortness of breath.  You have bleeding from your port that you cannot control. Summary  Take care of the port as told by your health   care provider. Keep the manufacturer's information card with you at all times.  Change your dressing as told by your health care provider.  Contact a health care provider if you have a fever or chills or if you have redness, swelling, or pain around your port insertion site.  Keep all follow-up visits as told by your health care provider. This information is not intended to replace advice given to you by your health care provider. Make sure you discuss any questions you have with your health care provider. Document Revised: 05/24/2018 Document Reviewed: 05/24/2018 Elsevier Patient Education   2020 [employer] Moderate Conscious Sedation, Adult Sedation is the use of medicines to promote relaxation and relieve discomfort and anxiety. Moderate conscious sedation is a type of sedation. Under moderate conscious sedation, you are less alert than normal, but you are still able to respond to instructions, touch, or both. Moderate conscious sedation is used during short medical and dental procedures. It is milder than deep sedation, which is a type of sedation under which you cannot be easily woken up. It is also milder than general anesthesia, which is the use of medicines to make you unconscious. Moderate conscious sedation allows you to return to your regular activities sooner. Tell a health care provider about:  Any allergies you have.  All medicines you are taking, including vitamins, herbs, eye drops, creams, and over-the-counter medicines.  Use of steroids (by mouth or creams).  Any problems you or family members have had with sedatives and anesthetic medicines.  Any blood disorders you have.  Any surgeries you have had.  Any medical conditions you have, such as sleep apnea.  Whether you are pregnant or may be pregnant.  Any use of cigarettes, alcohol, marijuana, or street drugs. What are the risks? Generally, this is a safe procedure. However, problems may occur, including:  Getting too much medicine (oversedation).  Nausea.  Allergic reaction to medicines.  Trouble breathing. If this happens, a breathing tube may be used to help with breathing. It will be removed when you are awake and breathing on your own.  Heart trouble.  Lung trouble. What happens before the procedure? Staying hydrated Follow instructions from your health care provider about hydration, which may include:  Up to 2 hours before the procedure - you may continue to drink clear liquids, such as water, clear fruit juice, black coffee, and plain tea. Eating and drinking restrictions Follow  instructions from your health care provider about eating and drinking, which may include:  8 hours before the procedure - stop eating heavy meals or foods such as meat, fried foods, or fatty foods.  6 hours before the procedure - stop eating light meals or foods, such as toast or cereal.  6 hours before the procedure - stop drinking milk or drinks that contain milk.  2 hours before the procedure - stop drinking clear liquids. Medicine Ask your health care provider about:  Changing or stopping your regular medicines. This is especially important if you are taking diabetes medicines or blood thinners.  Taking medicines such as aspirin and ibuprofen. These medicines can thin your blood. Do not take these medicines before your procedure if your health care provider instructs you not to.  Tests and exams  You will have a physical exam.  You may have blood tests done to show: ? How well your kidneys and liver are working. ? How well your blood can clot. General instructions  Plan to have someone take you home from the   hospital or clinic.  If you will be going home right after the procedure, plan to have someone with you for 24 hours. What happens during the procedure?  An IV tube will be inserted into one of your veins.  Medicine to help you relax (sedative) will be given through the IV tube.  The medical or dental procedure will be performed. What happens after the procedure?  Your blood pressure, heart rate, breathing rate, and blood oxygen level will be monitored often until the medicines you were given have worn off.  Do not drive for 24 hours. This information is not intended to replace advice given to you by your health care provider. Make sure you discuss any questions you have with your health care provider. Document Revised: 10/08/2017 Document Reviewed: 02/15/2016 Elsevier Patient Education  2020 [employer]  

## 2020-07-25 NOTE — Procedures (Signed)
Interventional Radiology Procedure Note  Procedure: Placement of a right IJ approach single lumen PowerPort.  Tip is positioned at the superior cavoatrial junction and catheter is ready for immediate use.  Complications: None Recommendations:  - Ok to shower tomorrow - Do not submerge for 7 days - Routine line care   Signed,  Kayshaun Polanco S. Tashauna Caisse, DO   

## 2020-07-25 NOTE — Sedation Documentation (Signed)
02 d/c 

## 2020-07-25 NOTE — Progress Notes (Signed)
Patient and wife was given discharge instructions. Both verbalized understanding. 

## 2020-07-26 ENCOUNTER — Ambulatory Visit: Payer: 59 | Admitting: Family

## 2020-07-28 ENCOUNTER — Other Ambulatory Visit: Payer: Self-pay | Admitting: Oncology

## 2020-07-29 ENCOUNTER — Other Ambulatory Visit: Payer: Self-pay

## 2020-07-29 DIAGNOSIS — Z95828 Presence of other vascular implants and grafts: Secondary | ICD-10-CM

## 2020-07-29 MED ORDER — LIDOCAINE-PRILOCAINE 2.5-2.5 % EX CREA: 1.0000 "application " | TOPICAL_CREAM | CUTANEOUS | 0 refills | Status: DC | PRN

## 2020-07-30 ENCOUNTER — Inpatient Hospital Stay: Payer: 59

## 2020-07-30 ENCOUNTER — Other Ambulatory Visit: Payer: Self-pay

## 2020-07-30 ENCOUNTER — Encounter: Payer: Self-pay | Admitting: Oncology

## 2020-07-30 ENCOUNTER — Inpatient Hospital Stay: Payer: 59 | Admitting: Oncology

## 2020-07-30 VITALS — BP 141/70 | HR 80 | Temp 96.6°F | Resp 16 | Ht 70.0 in | Wt 196.1 lb

## 2020-07-30 DIAGNOSIS — C20 Malignant neoplasm of rectum: Secondary | ICD-10-CM | POA: Diagnosis not present

## 2020-07-30 DIAGNOSIS — Z5111 Encounter for antineoplastic chemotherapy: Secondary | ICD-10-CM | POA: Diagnosis not present

## 2020-07-30 MED ORDER — HYDROCODONE-ACETAMINOPHEN 5-325 MG PO TABS
1.0000 | ORAL_TABLET | Freq: Four times a day (QID) | ORAL | 0 refills | Status: DC | PRN
Start: 2020-07-30 — End: 2020-08-13

## 2020-07-30 MED ORDER — OXALIPLATIN CHEMO INJECTION 100 MG/20ML
85.0000 mg/m2 | Freq: Once | INTRAVENOUS | Status: AC
  Administered 2020-07-30: 175 mg via INTRAVENOUS
  Filled 2020-07-30: qty 35

## 2020-07-30 MED ORDER — HEPARIN SOD (PORK) LOCK FLUSH 100 UNIT/ML IV SOLN
500.0000 [IU] | Freq: Once | INTRAVENOUS | Status: DC | PRN
  Filled 2020-07-30: qty 5

## 2020-07-30 MED ORDER — SODIUM CHLORIDE 0.9 % IV SOLN
2400.0000 mg/m2 | INTRAVENOUS | Status: DC
  Administered 2020-07-30: 5000 mg via INTRAVENOUS
  Filled 2020-07-30: qty 100

## 2020-07-30 MED ORDER — ONDANSETRON HCL 8 MG PO TABS: 8.0000 mg | ORAL_TABLET | Freq: Three times a day (TID) | ORAL | 1 refills | Status: DC | PRN

## 2020-07-30 MED ORDER — SODIUM CHLORIDE 0.9 % IV SOLN
10.0000 mg | Freq: Once | INTRAVENOUS | Status: AC
  Administered 2020-07-30: 10 mg via INTRAVENOUS
  Filled 2020-07-30: qty 10

## 2020-07-30 MED ORDER — FLUOROURACIL CHEMO INJECTION 2.5 GM/50ML
400.0000 mg/m2 | Freq: Once | INTRAVENOUS | Status: AC
  Administered 2020-07-30: 850 mg via INTRAVENOUS
  Filled 2020-07-30: qty 17

## 2020-07-30 MED ORDER — SODIUM CHLORIDE 0.9% FLUSH
10.0000 mL | INTRAVENOUS | Status: DC | PRN
  Filled 2020-07-30: qty 10

## 2020-07-30 MED ORDER — SODIUM CHLORIDE 0.9 % IV SOLN
16.0000 mg | Freq: Once | INTRAVENOUS | Status: AC
  Administered 2020-07-30: 16 mg via INTRAVENOUS
  Filled 2020-07-30: qty 8

## 2020-07-30 MED ORDER — DEXTROSE 5 % IV SOLN
Freq: Once | INTRAVENOUS | Status: AC
  Filled 2020-07-30: qty 250

## 2020-07-30 MED ORDER — LEUCOVORIN CALCIUM INJECTION 350 MG
400.0000 mg/m2 | Freq: Once | INTRAVENOUS | Status: AC
  Administered 2020-07-30: 832 mg via INTRAVENOUS
  Filled 2020-07-30: qty 41.6

## 2020-07-30 MED ORDER — PROCHLORPERAZINE MALEATE 10 MG PO TABS: 10.0000 mg | ORAL_TABLET | Freq: Four times a day (QID) | ORAL | 1 refills | Status: DC | PRN

## 2020-07-30 NOTE — Patient Instructions (Signed)
[facility] Discharge Instructions for Patients Receiving Chemotherapy  Today you received the following chemotherapy agents: Oxaliplatin, leucovorin, fluorouracil  To help prevent nausea and vomiting after your treatment, we encourage you to take your nausea medication as needed.   If you develop nausea and vomiting that is not controlled by your nausea medication, call the clinic.   BELOW ARE SYMPTOMS THAT SHOULD BE REPORTED IMMEDIATELY:  *FEVER GREATER THAN 100.5 F  *CHILLS WITH OR WITHOUT FEVER  NAUSEA AND VOMITING THAT IS NOT CONTROLLED WITH YOUR NAUSEA MEDICATION  *UNUSUAL SHORTNESS OF BREATH  *UNUSUAL BRUISING OR BLEEDING  TENDERNESS IN MOUTH AND THROAT WITH OR WITHOUT PRESENCE OF ULCERS  *URINARY PROBLEMS  *BOWEL PROBLEMS  UNUSUAL RASH Items with * indicate a potential emergency and should be followed up as soon as possible.  Feel free to call the clinic should you have any questions or concerns. The clinic phone number is [phone].  Please show the [facility] at check-in to the Emergency Department and triage nurse.  Oxaliplatin Injection What is this medicine? OXALIPLATIN (ox AL i PLA tin) is a chemotherapy drug. It targets fast dividing cells, like cancer cells, and causes these cells to die. This medicine is used to treat cancers of the colon and rectum, and many other cancers. This medicine may be used for other purposes; ask your health care provider or pharmacist if you have questions. COMMON BRAND NAME(S): Eloxatin What should I tell my health care provider before I take this medicine? They need to know if you have any of these conditions:  heart disease  history of irregular heartbeat  liver disease  low blood counts, like white cells, platelets, or red blood cells  lung or breathing disease, like asthma  take medicines that treat or prevent blood clots  tingling of the fingers or toes, or other nerve disorder  an  unusual or allergic reaction to oxaliplatin, other chemotherapy, other medicines, foods, dyes, or preservatives  pregnant or trying to get pregnant  breast-feeding How should I use this medicine? This drug is given as an infusion into a vein. It is administered in a hospital or clinic by a specially trained health care professional. Talk to your pediatrician regarding the use of this medicine in children. Special care may be needed. Overdosage: If you think you have taken too much of this medicine contact a poison control center or emergency room at once. NOTE: This medicine is only for you. Do not share this medicine with others. What if I miss a dose? It is important not to miss a dose. Call your doctor or health care professional if you are unable to keep an appointment. What may interact with this medicine? Do not take this medicine with any of the following medications:  cisapride  dronedarone  pimozide  thioridazine This medicine may also interact with the following medications:  aspirin and aspirin-like medicines  certain medicines that treat or prevent blood clots like warfarin, apixaban, dabigatran, and rivaroxaban  cisplatin  cyclosporine  diuretics  medicines for infection like acyclovir, adefovir, amphotericin B, bacitracin, cidofovir, foscarnet, ganciclovir, gentamicin, pentamidine, vancomycin  NSAIDs, medicines for pain and inflammation, like ibuprofen or naproxen  other medicines that prolong the QT interval (an abnormal heart rhythm)  pamidronate  zoledronic acid This list may not describe all possible interactions. Give your health care provider a list of all the medicines, herbs, non-prescription drugs, or dietary supplements you use. Also tell them if you smoke, drink alcohol,  or use illegal drugs. Some items may interact with your medicine. What should I watch for while using this medicine? Your condition will be monitored carefully while you are  receiving this medicine. You may need blood work done while you are taking this medicine. This medicine may make you feel generally unwell. This is not uncommon as chemotherapy can affect healthy cells as well as cancer cells. Report any side effects. Continue your course of treatment even though you feel ill unless your healthcare professional tells you to stop. This medicine can make you more sensitive to cold. Do not drink cold drinks or use ice. Cover exposed skin before coming in contact with cold temperatures or cold objects. When out in cold weather wear warm clothing and cover your mouth and nose to warm the air that goes into your lungs. Tell your doctor if you get sensitive to the cold. Do not become pregnant while taking this medicine or for 9 months after stopping it. Women should inform their health care professional if they wish to become pregnant or think they might be pregnant. Men should not father a child while taking this medicine and for 6 months after stopping it. There is potential for serious side effects to an unborn child. Talk to your health care professional for more information. Do not breast-feed a child while taking this medicine or for 3 months after stopping it. This medicine has caused ovarian failure in some women. This medicine may make it more difficult to get pregnant. Talk to your health care professional if you are concerned about your fertility. This medicine has caused decreased sperm counts in some men. This may make it more difficult to father a child. Talk to your health care professional if you are concerned about your fertility. This medicine may increase your risk of getting an infection. Call your health care professional for advice if you get a fever, chills, or sore throat, or other symptoms of a cold or flu. Do not treat yourself. Try to avoid being around people who are sick. Avoid taking medicines that contain aspirin, acetaminophen, ibuprofen, naproxen,  or ketoprofen unless instructed by your health care professional. These medicines may hide a fever. Be careful brushing or flossing your teeth or using a toothpick because you may get an infection or bleed more easily. If you have any dental work done, tell your dentist you are receiving this medicine. What side effects may I notice from receiving this medicine? Side effects that you should report to your doctor or health care professional as soon as possible:  allergic reactions like skin rash, itching or hives, swelling of the face, lips, or tongue  breathing problems  cough  low blood counts - this medicine may decrease the number of white blood cells, red blood cells, and platelets. You may be at increased risk for infections and bleeding  nausea, vomiting  pain, redness, or irritation at site where injected  pain, tingling, numbness in the hands or feet  signs and symptoms of bleeding such as bloody or black, tarry stools; red or dark brown urine; spitting up blood or brown material that looks like coffee grounds; red spots on the skin; unusual bruising or bleeding from the eyes, gums, or nose  signs and symptoms of a dangerous change in heartbeat or heart rhythm like chest pain; dizziness; fast, irregular heartbeat; palpitations; feeling faint or lightheaded; falls  signs and symptoms of infection like fever; chills; cough; sore throat; pain or trouble passing urine  signs and symptoms of liver injury like dark yellow or brown urine; general ill feeling or flu-like symptoms; light-colored stools; loss of appetite; nausea; right upper belly pain; unusually weak or tired; yellowing of the eyes or skin  signs and symptoms of low red blood cells or anemia such as unusually weak or tired; feeling faint or lightheaded; falls  signs and symptoms of muscle injury like dark urine; trouble passing urine or change in the amount of urine; unusually weak or tired; muscle pain; back pain Side  effects that usually do not require medical attention (report to your doctor or health care professional if they continue or are bothersome):  changes in taste  diarrhea  gas  hair loss  loss of appetite  mouth sores This list may not describe all possible side effects. Call your doctor for medical advice about side effects. You may report side effects to FDA at 1-800-FDA-1088. Where should I keep my medicine? This drug is given in a hospital or clinic and will not be stored at home. NOTE: This sheet is a summary. It may not cover all possible information. If you have questions about this medicine, talk to your doctor, pharmacist, or health care provider.  2020 Elsevier/Gold Standard (2019-03-15 12:20:35)  Leucovorin injection What is this medicine? LEUCOVORIN (loo koe VOR in) is used to prevent or treat the harmful effects of some medicines. This medicine is used to treat anemia caused by a low amount of folic acid in the body. It is also used with 5-fluorouracil (5-FU) to treat colon cancer. This medicine may be used for other purposes; ask your health care provider or pharmacist if you have questions. What should I tell my health care provider before I take this medicine? They need to know if you have any of these conditions:  anemia from low levels of vitamin B-12 in the blood  an unusual or allergic reaction to leucovorin, folic acid, other medicines, foods, dyes, or preservatives  pregnant or trying to get pregnant  breast-feeding How should I use this medicine? This medicine is for injection into a muscle or into a vein. It is given by a health care professional in a hospital or clinic setting. Talk to your pediatrician regarding the use of this medicine in children. Special care may be needed. Overdosage: If you think you have taken too much of this medicine contact a poison control center or emergency room at once. NOTE: This medicine is only for you. Do not share this  medicine with others. What if I miss a dose? This does not apply. What may interact with this medicine?  capecitabine  fluorouracil  phenobarbital  phenytoin  primidone  trimethoprim-sulfamethoxazole This list may not describe all possible interactions. Give your health care provider a list of all the medicines, herbs, non-prescription drugs, or dietary supplements you use. Also tell them if you smoke, drink alcohol, or use illegal drugs. Some items may interact with your medicine. What should I watch for while using this medicine? Your condition will be monitored carefully while you are receiving this medicine. This medicine may increase the side effects of 5-fluorouracil, 5-FU. Tell your doctor or health care professional if you have diarrhea or mouth sores that do not get better or that get worse. What side effects may I notice from receiving this medicine? Side effects that you should report to your doctor or health care professional as soon as possible:  allergic reactions like skin rash, itching or hives, swelling of the face,  lips, or tongue  breathing problems  fever, infection  mouth sores  unusual bleeding or bruising  unusually weak or tired Side effects that usually do not require medical attention (report to your doctor or health care professional if they continue or are bothersome):  constipation or diarrhea  loss of appetite  nausea, vomiting This list may not describe all possible side effects. Call your doctor for medical advice about side effects. You may report side effects to FDA at 1-800-FDA-1088. Where should I keep my medicine? This drug is given in a hospital or clinic and will not be stored at home. NOTE: This sheet is a summary. It may not cover all possible information. If you have questions about this medicine, talk to your doctor, pharmacist, or health care provider.  2020 Elsevier/Gold Standard (2008-05-01 16:50:29)  Fluorouracil, 5-FU  injection What is this medicine? FLUOROURACIL, 5-FU (flure oh YOOR a sil) is a chemotherapy drug. It slows the growth of cancer cells. This medicine is used to treat many types of cancer like breast cancer, colon or rectal cancer, pancreatic cancer, and stomach cancer. This medicine may be used for other purposes; ask your health care provider or pharmacist if you have questions. COMMON BRAND NAME(S): Adrucil What should I tell my health care provider before I take this medicine? They need to know if you have any of these conditions:  blood disorders  dihydropyrimidine dehydrogenase (DPD) deficiency  infection (especially a virus infection such as chickenpox, cold sores, or herpes)  kidney disease  liver disease  malnourished, poor nutrition  recent or ongoing radiation therapy  an unusual or allergic reaction to fluorouracil, other chemotherapy, other medicines, foods, dyes, or preservatives  pregnant or trying to get pregnant  breast-feeding How should I use this medicine? This drug is given as an infusion or injection into a vein. It is administered in a hospital or clinic by a specially trained health care professional. Talk to your pediatrician regarding the use of this medicine in children. Special care may be needed. Overdosage: If you think you have taken too much of this medicine contact a poison control center or emergency room at once. NOTE: This medicine is only for you. Do not share this medicine with others. What if I miss a dose? It is important not to miss your dose. Call your doctor or health care professional if you are unable to keep an appointment. What may interact with this medicine?  allopurinol  cimetidine  dapsone  digoxin  hydroxyurea  leucovorin  levamisole  medicines for seizures like ethotoin, fosphenytoin, phenytoin  medicines to increase blood counts like filgrastim, pegfilgrastim, sargramostim  medicines that treat or prevent blood  clots like warfarin, enoxaparin, and dalteparin  methotrexate  metronidazole  pyrimethamine  some other chemotherapy drugs like busulfan, cisplatin, estramustine, vinblastine  trimethoprim  trimetrexate  vaccines Talk to your doctor or health care professional before taking any of these medicines:  acetaminophen  aspirin  ibuprofen  ketoprofen  naproxen This list may not describe all possible interactions. Give your health care provider a list of all the medicines, herbs, non-prescription drugs, or dietary supplements you use. Also tell them if you smoke, drink alcohol, or use illegal drugs. Some items may interact with your medicine. What should I watch for while using this medicine? Visit your doctor for checks on your progress. This drug may make you feel generally unwell. This is not uncommon, as chemotherapy can affect healthy cells as well as cancer cells. Report any side  effects. Continue your course of treatment even though you feel ill unless your doctor tells you to stop. In some cases, you may be given additional medicines to help with side effects. Follow all directions for their use. Call your doctor or health care professional for advice if you get a fever, chills or sore throat, or other symptoms of a cold or flu. Do not treat yourself. This drug decreases your body's ability to fight infections. Try to avoid being around people who are sick. This medicine may increase your risk to bruise or bleed. Call your doctor or health care professional if you notice any unusual bleeding. Be careful brushing and flossing your teeth or using a toothpick because you may get an infection or bleed more easily. If you have any dental work done, tell your dentist you are receiving this medicine. Avoid taking products that contain aspirin, acetaminophen, ibuprofen, naproxen, or ketoprofen unless instructed by your doctor. These medicines may hide a fever. Do not become pregnant while  taking this medicine. Women should inform their doctor if they wish to become pregnant or think they might be pregnant. There is a potential for serious side effects to an unborn child. Talk to your health care professional or pharmacist for more information. Do not breast-feed an infant while taking this medicine. Men should inform their doctor if they wish to father a child. This medicine may lower sperm counts. Do not treat diarrhea with over the counter products. Contact your doctor if you have diarrhea that lasts more than 2 days or if it is severe and watery. This medicine can make you more sensitive to the sun. Keep out of the sun. If you cannot avoid being in the sun, wear protective clothing and use sunscreen. Do not use sun lamps or tanning beds/booths. What side effects may I notice from receiving this medicine? Side effects that you should report to your doctor or health care professional as soon as possible:  allergic reactions like skin rash, itching or hives, swelling of the face, lips, or tongue  low blood counts - this medicine may decrease the number of white blood cells, red blood cells and platelets. You may be at increased risk for infections and bleeding.  signs of infection - fever or chills, cough, sore throat, pain or difficulty passing urine  signs of decreased platelets or bleeding - bruising, pinpoint red spots on the skin, black, tarry stools, blood in the urine  signs of decreased red blood cells - unusually weak or tired, fainting spells, lightheadedness  breathing problems  changes in vision  chest pain  mouth sores  nausea and vomiting  pain, swelling, redness at site where injected  pain, tingling, numbness in the hands or feet  redness, swelling, or sores on hands or feet  stomach pain  unusual bleeding Side effects that usually do not require medical attention (report to your doctor or health care professional if they continue or are  bothersome):  changes in finger or toe nails  diarrhea  dry or itchy skin  hair loss  headache  loss of appetite  sensitivity of eyes to the light  stomach upset  unusually teary eyes This list may not describe all possible side effects. Call your doctor for medical advice about side effects. You may report side effects to FDA at 1-800-FDA-1088. Where should I keep my medicine? This drug is given in a hospital or clinic and will not be stored at home. NOTE: This sheet is a summary.  It may not cover all possible information. If you have questions about this medicine, talk to your doctor, pharmacist, or health care provider.  2020 Elsevier/Gold Standard (2008-02-29 13:53:16)

## 2020-07-30 NOTE — Progress Notes (Signed)
  [facility] OFFICE PROGRESS NOTE   Diagnosis: Rectal cancer  INTERVAL HISTORY:   Shane [name] returns for a scheduled visit.  He continues to have rectal/hemorrhoid pain.  He takes hydrocodone for relief of pain.  He is having bowel movements.  He underwent Port-A-Cath placement 07/25/2020.  He has been evaluated by Dr. Marcello Moores.  Good appetite.  Objective:  Vital signs in last 24 hours:  Blood pressure (!) 141/70, pulse 80, temperature (!) 96.6 F (35.9 C), temperature source Tympanic, resp. rate 16, height 5\' 10"  (1.778 m), weight 196 lb 1.6 oz (89 kg), SpO2 100 %.    Resp: Scattered inspiratory rhonchi, no respiratory distress Cardio: Regular rate and rhythm GI: Nondistended, no hepatomegaly, nontender Vascular: No leg edema    Portacath/PICC-without erythema  Lab Results:  Lab Results  Component Value Date   WBC 18.2 (H) 07/15/2020   HGB 8.7 (L) 07/15/2020   HCT 27.0 (L) 07/15/2020   MCV 71.1 (L) 07/15/2020   PLT 426 (H) 07/15/2020   NEUTROABS 15.3 (H) 07/15/2020    CMP  Lab Results  Component Value Date   NA 136 07/15/2020   K 3.7 07/15/2020   CL 106 07/15/2020   CO2 21 (L) 07/15/2020   GLUCOSE 100 (H) 07/15/2020   BUN 21 07/15/2020   CREATININE 0.94 07/15/2020   CALCIUM 9.2 07/15/2020   PROT 7.7 07/04/2020   ALBUMIN 3.2 (L) 07/04/2020   AST 16 07/04/2020   ALT 14 07/04/2020   ALKPHOS 93 07/04/2020   BILITOT 0.4 07/04/2020   GFRNONAA >60 07/15/2020   GFRAA >60 07/15/2020    Lab Results  Component Value Date   CEA1 6.76 (H) 07/04/2020    No results found for: INR  Imaging:  No results found.  Medications: I have reviewed the patient's current medications.   Assessment/Plan:  1. Rectal cancer  CT abdomen/pelvis 06/27/2020-irregular masslike thickening of the rectum, increased colonic stool, perirectal infiltration into the presacral space, tiny bibasilar pulmonary nodules  CT chest 07/12/2020-multiple small pulmonary nodules,  largest 3 mm in the left apex  MRI pelvis 07/12/2020-tumor at 11.4 centimeters from the anal verge, 6.6 cm from the internal sphincter, tumor extends through the right lateral rectal wall and directly involves an adjacent loop of distal sigmoid colon with invasion of anterior peritoneal reflection and involvement of the right seminal vesicle, no lymphadenopathy.  T4N0  Colonoscopy 07/17/2020-mass at 12 cm from the anal verge, completely obstructing mass at 23 cm from the anal verge, large internal hemorrhoids, biopsy confirmed adenocarcinoma  Cycle 1 FOLFOX 07/30/2020  2.  Iron deficiency anemia secondary to #1 3.  Hemorrhoids 4.  Rectal pain secondary to hemorrhoids versus the primary rectal tumor 5.  Constipation secondary to #1, improved with a stool softener and laxative regimen 6.  Small indeterminate pulmonary nodules 7.  Arthritis   Disposition: [name] has been diagnosed with locally advanced rectal cancer.  He will begin total neoadjuvant therapy today.  The plan is to complete 8 cycles of FOLFOX prior to concurrent capecitabine and radiation.  We reviewed potential toxicities associated with the FOLFOX regimen.  He agrees to proceed.  He will wean hydrocodone as tolerated.  Betsy Coder, MD  07/30/2020  8:52 AM

## 2020-07-30 NOTE — Progress Notes (Signed)
Per Dr. Benay Spice: OK to treat based on labs of 07/15/20

## 2020-07-30 NOTE — Addendum Note (Signed)
Addended by: Tania Ade on: 07/30/2020 09:57 AM   Modules accepted: Orders

## 2020-07-30 NOTE — Progress Notes (Signed)
Met w/ pt to introduce myself as his Arboriculturist.  Unfortunately there aren't any foundations offering copay assistance for his Dx and the type of ins he has.  I informed him of the [employer] and gave him an expense sheet.  He would like to review his income and contact me if he would like to apply for the grant.  He has my card for any questions or concerns he may have in the future.

## 2020-07-31 ENCOUNTER — Telehealth: Payer: Self-pay | Admitting: Oncology

## 2020-07-31 NOTE — Telephone Encounter (Signed)
Scheduled appointments per 9/21 los. Spoke to patient who is aware of all upcoming appointments dates and times.

## 2020-08-01 ENCOUNTER — Inpatient Hospital Stay: Payer: 59

## 2020-08-01 ENCOUNTER — Other Ambulatory Visit: Payer: Self-pay

## 2020-08-01 DIAGNOSIS — C20 Malignant neoplasm of rectum: Secondary | ICD-10-CM

## 2020-08-01 DIAGNOSIS — Z5111 Encounter for antineoplastic chemotherapy: Secondary | ICD-10-CM | POA: Diagnosis not present

## 2020-08-01 MED ORDER — SODIUM CHLORIDE 0.9% FLUSH
10.0000 mL | INTRAVENOUS | Status: DC | PRN
  Administered 2020-08-01: 10 mL
  Filled 2020-08-01: qty 10

## 2020-08-01 MED ORDER — HEPARIN SOD (PORK) LOCK FLUSH 100 UNIT/ML IV SOLN
500.0000 [IU] | Freq: Once | INTRAVENOUS | Status: AC | PRN
  Administered 2020-08-01: 500 [IU]
  Filled 2020-08-01: qty 5

## 2020-08-01 NOTE — Patient Instructions (Signed)

## 2020-08-02 ENCOUNTER — Other Ambulatory Visit: Payer: Self-pay | Admitting: Orthopaedic Surgery

## 2020-08-09 DIAGNOSIS — C20 Malignant neoplasm of rectum: Secondary | ICD-10-CM

## 2020-08-09 HISTORY — DX: Malignant neoplasm of rectum: C20

## 2020-08-13 ENCOUNTER — Encounter: Payer: Self-pay | Admitting: Nurse Practitioner

## 2020-08-13 ENCOUNTER — Other Ambulatory Visit: Payer: Self-pay

## 2020-08-13 ENCOUNTER — Inpatient Hospital Stay: Payer: 59

## 2020-08-13 ENCOUNTER — Inpatient Hospital Stay: Payer: 59 | Attending: Physician Assistant | Admitting: Nurse Practitioner

## 2020-08-13 ENCOUNTER — Ambulatory Visit: Payer: 59

## 2020-08-13 VITALS — BP 157/75 | HR 94 | Temp 96.9°F | Resp 16 | Ht 70.0 in | Wt 192.8 lb

## 2020-08-13 DIAGNOSIS — C20 Malignant neoplasm of rectum: Secondary | ICD-10-CM

## 2020-08-13 DIAGNOSIS — Z5111 Encounter for antineoplastic chemotherapy: Secondary | ICD-10-CM | POA: Diagnosis not present

## 2020-08-13 DIAGNOSIS — Z23 Encounter for immunization: Secondary | ICD-10-CM | POA: Diagnosis not present

## 2020-08-13 LAB — CBC WITH DIFFERENTIAL (CANCER CENTER ONLY)
Abs Immature Granulocytes: 0.03 10*3/uL (ref 0.00–0.07)
Basophils Absolute: 0.1 10*3/uL (ref 0.0–0.1)
Basophils Relative: 1 %
Eosinophils Absolute: 0.1 10*3/uL (ref 0.0–0.5)
Eosinophils Relative: 2 %
HCT: 25.6 % — ABNORMAL LOW (ref 39.0–52.0)
Hemoglobin: 8.3 g/dL — ABNORMAL LOW (ref 13.0–17.0)
Immature Granulocytes: 0 %
Lymphocytes Relative: 10 %
Lymphs Abs: 0.8 10*3/uL (ref 0.7–4.0)
MCH: 22.2 pg — ABNORMAL LOW (ref 26.0–34.0)
MCHC: 32.4 g/dL (ref 30.0–36.0)
MCV: 68.4 fL — ABNORMAL LOW (ref 80.0–100.0)
Monocytes Absolute: 1.2 10*3/uL — ABNORMAL HIGH (ref 0.1–1.0)
Monocytes Relative: 14 %
Neutro Abs: 6.1 10*3/uL (ref 1.7–7.7)
Neutrophils Relative %: 73 %
Platelet Count: 456 10*3/uL — ABNORMAL HIGH (ref 150–400)
RBC: 3.74 MIL/uL — ABNORMAL LOW (ref 4.22–5.81)
RDW: 19.9 % — ABNORMAL HIGH (ref 11.5–15.5)
WBC Count: 8.5 10*3/uL (ref 4.0–10.5)
nRBC: 0 % (ref 0.0–0.2)

## 2020-08-13 LAB — CMP (CANCER CENTER ONLY)
ALT: 15 U/L (ref 0–44)
AST: 21 U/L (ref 15–41)
Albumin: 3 g/dL — ABNORMAL LOW (ref 3.5–5.0)
Alkaline Phosphatase: 101 U/L (ref 38–126)
Anion gap: 7 (ref 5–15)
BUN: 17 mg/dL (ref 8–23)
CO2: 22 mmol/L (ref 22–32)
Calcium: 9.5 mg/dL (ref 8.9–10.3)
Chloride: 106 mmol/L (ref 98–111)
Creatinine: 0.83 mg/dL (ref 0.61–1.24)
GFR, Estimated: >60 mL/min (ref >60)
Glucose, Bld: 94 mg/dL (ref 70–99)
Potassium: 3.9 mmol/L (ref 3.5–5.1)
Sodium: 135 mmol/L (ref 135–145)
Total Bilirubin: 0.4 mg/dL (ref 0.3–1.2)
Total Protein: 7.5 g/dL (ref 6.5–8.1)

## 2020-08-13 MED ORDER — LEUCOVORIN CALCIUM INJECTION 350 MG
400.0000 mg/m2 | Freq: Once | INTRAVENOUS | Status: AC
  Administered 2020-08-13: 832 mg via INTRAVENOUS
  Filled 2020-08-13: qty 41.6

## 2020-08-13 MED ORDER — OXALIPLATIN CHEMO INJECTION 100 MG/20ML
85.0000 mg/m2 | Freq: Once | INTRAVENOUS | Status: AC
  Administered 2020-08-13: 175 mg via INTRAVENOUS
  Filled 2020-08-13: qty 35

## 2020-08-13 MED ORDER — SODIUM CHLORIDE 0.9 % IV SOLN
16.0000 mg | Freq: Once | INTRAVENOUS | Status: AC
  Administered 2020-08-13: 16 mg via INTRAVENOUS
  Filled 2020-08-13: qty 8

## 2020-08-13 MED ORDER — SODIUM CHLORIDE 0.9 % IV SOLN
10.0000 mg | Freq: Once | INTRAVENOUS | Status: AC
  Administered 2020-08-13: 10 mg via INTRAVENOUS
  Filled 2020-08-13: qty 10

## 2020-08-13 MED ORDER — SODIUM CHLORIDE 0.9% FLUSH
10.0000 mL | Freq: Once | INTRAVENOUS | Status: AC
  Administered 2020-08-13: 10 mL
  Filled 2020-08-13: qty 10

## 2020-08-13 MED ORDER — HYDROCODONE-ACETAMINOPHEN 5-325 MG PO TABS: 1.0000 | ORAL_TABLET | Freq: Four times a day (QID) | ORAL | 0 refills | Status: DC | PRN

## 2020-08-13 MED ORDER — FLUOROURACIL CHEMO INJECTION 2.5 GM/50ML
400.0000 mg/m2 | Freq: Once | INTRAVENOUS | Status: AC
  Administered 2020-08-13: 850 mg via INTRAVENOUS
  Filled 2020-08-13: qty 17

## 2020-08-13 MED ORDER — DEXTROSE 5 % IV SOLN
Freq: Once | INTRAVENOUS | Status: AC
  Filled 2020-08-13: qty 250

## 2020-08-13 MED ORDER — SODIUM CHLORIDE 0.9 % IV SOLN
2400.0000 mg/m2 | INTRAVENOUS | Status: DC
  Administered 2020-08-13: 5000 mg via INTRAVENOUS
  Filled 2020-08-13: qty 100

## 2020-08-13 NOTE — Progress Notes (Signed)
Okay to release premeds with CBC results while waiting for CMP results per Ned Card NP.

## 2020-08-13 NOTE — Progress Notes (Signed)
  [place] OFFICE PROGRESS NOTE   Diagnosis:  Rectal cancer  INTERVAL HISTORY:   Shane [name] returns as scheduled.  He completed cycle 1 FOLFOX 07/30/2020.  He denies nausea/vomiting.  No mouth sores.  No diarrhea.  Cold sensitivity lasted a few days.  No persistent neuropathy symptoms.  Bowels are moving.  He continues MiraLAX.  Some pain at the rectum.  He takes hydrocodone as needed.  Objective:  Vital signs in last 24 hours:  Blood pressure (!) 157/75, pulse 94, temperature (!) 96.9 F (36.1 C), temperature source Tympanic, resp. rate 16, height 5\' 10"  (1.778 m), weight 192 lb 12.8 oz (87.5 kg), SpO2 100 %.    HEENT: No thrush or ulcers. Resp: Lungs clear bilaterally. Cardio: Regular rate and rhythm. GI: Abdomen soft and nontender.  No hepatomegaly. Vascular: No leg edema. Skin: Palms without erythema. Port-A-Cath without erythema.   Lab Results:  Lab Results  Component Value Date   WBC 18.2 (H) 07/15/2020   HGB 8.7 (L) 07/15/2020   HCT 27.0 (L) 07/15/2020   MCV 71.1 (L) 07/15/2020   PLT 426 (H) 07/15/2020   NEUTROABS 15.3 (H) 07/15/2020    Imaging:  No results found.  Medications: I have reviewed the patient's current medications.  Assessment/Plan:  1. Rectal cancer  CT abdomen/pelvis 06/27/2020-irregular masslike thickening of the rectum, increased colonic stool, perirectal infiltration into the presacral space, tiny bibasilar pulmonary nodules  CT chest 07/12/2020-multiple small pulmonary nodules, largest 3 mm in the left apex  MRI pelvis 07/12/2020-tumor at 11.4 centimeters from the anal verge, 6.6 cm from the internal sphincter, tumor extends through the right lateral rectal wall and directly involves an adjacent loop of distal sigmoid colon with invasion of anterior peritoneal reflection and involvement of the right seminal vesicle, no lymphadenopathy.  T4N0  Colonoscopy 07/17/2020-mass at 12 cm from the anal verge, completely obstructing mass  at 23 cm from the anal verge, large internal hemorrhoids, biopsy confirmed adenocarcinoma  Cycle 1 FOLFOX 07/30/2020  Cycle 2 FOLFOX 08/13/2020  2.  Iron deficiency anemia secondary to #1 3.  Hemorrhoids 4.  Rectal pain secondary to hemorrhoids versus the primary rectal tumor 5.  Constipation secondary to #1, improved with a stool softener and laxative regimen 6.  Small indeterminate pulmonary nodules 7.  Arthritis  Disposition: Shane [name] appears stable.  He has completed 1 cycle of FOLFOX.  He tolerated the chemotherapy well.  Plan to proceed with cycle 2 today as scheduled.  We reviewed the CBC from today.  Counts adequate to proceed with treatment.  He has a persistent microcytic anemia due to iron deficiency.  He continues oral iron.  He will return for lab, follow-up, cycle 3 FOLFOX in 2 weeks.  He will contact the office in the interim with any problems.    Ned Card ANP/GNP-BC   08/13/2020  8:58 AM

## 2020-08-13 NOTE — Addendum Note (Signed)
Addended by: Betsy Coder B on: 08/13/2020 09:41 AM   Modules accepted: Orders

## 2020-08-13 NOTE — Patient Instructions (Signed)
[facility] Center Discharge Instructions for Patients Receiving Chemotherapy  Today you received the following chemotherapy agents: oxaliplatin, leucovorin, and fluorouracil.  To help prevent nausea and vomiting after your treatment, we encourage you to take your nausea medication as directed.   If you develop nausea and vomiting that is not controlled by your nausea medication, call the clinic.   BELOW ARE SYMPTOMS THAT SHOULD BE REPORTED IMMEDIATELY:  *FEVER GREATER THAN 100.5 F  *CHILLS WITH OR WITHOUT FEVER  NAUSEA AND VOMITING THAT IS NOT CONTROLLED WITH YOUR NAUSEA MEDICATION  *UNUSUAL SHORTNESS OF BREATH  *UNUSUAL BRUISING OR BLEEDING  TENDERNESS IN MOUTH AND THROAT WITH OR WITHOUT PRESENCE OF ULCERS  *URINARY PROBLEMS  *BOWEL PROBLEMS  UNUSUAL RASH Items with * indicate a potential emergency and should be followed up as soon as possible.  Feel free to call the clinic should you have any questions or concerns. The clinic phone number is [phone].  Please show the CHEMO ALERT CARD at check-in to the Emergency Department and triage nurse.   

## 2020-08-14 ENCOUNTER — Ambulatory Visit: Payer: 59

## 2020-08-14 ENCOUNTER — Telehealth: Payer: Self-pay | Admitting: Nurse Practitioner

## 2020-08-14 NOTE — Telephone Encounter (Signed)
Scheduled appointments per 10/5 los. Will mail patient updated calendar with appointments dates and times.

## 2020-08-15 ENCOUNTER — Inpatient Hospital Stay: Payer: 59

## 2020-08-15 ENCOUNTER — Other Ambulatory Visit: Payer: Self-pay

## 2020-08-15 VITALS — BP 142/64 | HR 88 | Temp 98.5°F | Resp 18

## 2020-08-15 DIAGNOSIS — Z5111 Encounter for antineoplastic chemotherapy: Secondary | ICD-10-CM | POA: Diagnosis not present

## 2020-08-15 DIAGNOSIS — C20 Malignant neoplasm of rectum: Secondary | ICD-10-CM

## 2020-08-15 MED ORDER — HEPARIN SOD (PORK) LOCK FLUSH 100 UNIT/ML IV SOLN
500.0000 [IU] | Freq: Once | INTRAVENOUS | Status: AC | PRN
  Administered 2020-08-15: 500 [IU]
  Filled 2020-08-15: qty 5

## 2020-08-15 MED ORDER — SODIUM CHLORIDE 0.9% FLUSH
10.0000 mL | INTRAVENOUS | Status: DC | PRN
  Administered 2020-08-15: 10 mL
  Filled 2020-08-15: qty 10

## 2020-08-15 NOTE — Patient Instructions (Signed)

## 2020-08-24 ENCOUNTER — Other Ambulatory Visit: Payer: Self-pay | Admitting: Oncology

## 2020-08-27 ENCOUNTER — Inpatient Hospital Stay (HOSPITAL_BASED_OUTPATIENT_CLINIC_OR_DEPARTMENT_OTHER): Payer: 59 | Admitting: Oncology

## 2020-08-27 ENCOUNTER — Inpatient Hospital Stay: Payer: 59

## 2020-08-27 ENCOUNTER — Ambulatory Visit: Payer: 59

## 2020-08-27 ENCOUNTER — Other Ambulatory Visit: Payer: Self-pay

## 2020-08-27 VITALS — BP 154/78 | HR 81 | Temp 97.8°F | Resp 18 | Ht 70.0 in | Wt 191.5 lb

## 2020-08-27 DIAGNOSIS — C20 Malignant neoplasm of rectum: Secondary | ICD-10-CM

## 2020-08-27 DIAGNOSIS — Z23 Encounter for immunization: Secondary | ICD-10-CM | POA: Diagnosis not present

## 2020-08-27 DIAGNOSIS — Z5111 Encounter for antineoplastic chemotherapy: Secondary | ICD-10-CM | POA: Diagnosis not present

## 2020-08-27 LAB — CBC WITH DIFFERENTIAL (CANCER CENTER ONLY)
Abs Immature Granulocytes: 0.01 10*3/uL (ref 0.00–0.07)
Basophils Absolute: 0.1 10*3/uL (ref 0.0–0.1)
Basophils Relative: 3 %
Eosinophils Absolute: 0.2 10*3/uL (ref 0.0–0.5)
Eosinophils Relative: 5 %
HCT: 25.7 % — ABNORMAL LOW (ref 39.0–52.0)
Hemoglobin: 8.2 g/dL — ABNORMAL LOW (ref 13.0–17.0)
Immature Granulocytes: 0 %
Lymphocytes Relative: 28 %
Lymphs Abs: 1.3 10*3/uL (ref 0.7–4.0)
MCH: 22.2 pg — ABNORMAL LOW (ref 26.0–34.0)
MCHC: 31.9 g/dL (ref 30.0–36.0)
MCV: 69.6 fL — ABNORMAL LOW (ref 80.0–100.0)
Monocytes Absolute: 0.9 10*3/uL (ref 0.1–1.0)
Monocytes Relative: 19 %
Neutro Abs: 2.2 10*3/uL (ref 1.7–7.7)
Neutrophils Relative %: 45 %
Platelet Count: 409 10*3/uL — ABNORMAL HIGH (ref 150–400)
RBC: 3.69 MIL/uL — ABNORMAL LOW (ref 4.22–5.81)
RDW: 21.2 % — ABNORMAL HIGH (ref 11.5–15.5)
WBC Count: 4.8 10*3/uL (ref 4.0–10.5)
nRBC: 0 % (ref 0.0–0.2)

## 2020-08-27 LAB — CMP (CANCER CENTER ONLY)
ALT: 14 U/L (ref 0–44)
AST: 19 U/L (ref 15–41)
Albumin: 2.8 g/dL — ABNORMAL LOW (ref 3.5–5.0)
Alkaline Phosphatase: 99 U/L (ref 38–126)
Anion gap: 3 — ABNORMAL LOW (ref 5–15)
BUN: 17 mg/dL (ref 8–23)
CO2: 26 mmol/L (ref 22–32)
Calcium: 9.2 mg/dL (ref 8.9–10.3)
Chloride: 108 mmol/L (ref 98–111)
Creatinine: 0.85 mg/dL (ref 0.61–1.24)
GFR, Estimated: >60 mL/min (ref >60)
Glucose, Bld: 97 mg/dL (ref 70–99)
Potassium: 4.2 mmol/L (ref 3.5–5.1)
Sodium: 137 mmol/L (ref 135–145)
Total Bilirubin: 0.3 mg/dL (ref 0.3–1.2)
Total Protein: 6.9 g/dL (ref 6.5–8.1)

## 2020-08-27 MED ORDER — HYDROCODONE-ACETAMINOPHEN 5-325 MG PO TABS
1.0000 | ORAL_TABLET | Freq: Four times a day (QID) | ORAL | 0 refills | Status: DC | PRN
Start: 2020-08-27 — End: 2020-11-18

## 2020-08-27 MED ORDER — FLUOROURACIL CHEMO INJECTION 2.5 GM/50ML
400.0000 mg/m2 | Freq: Once | INTRAVENOUS | Status: AC
  Administered 2020-08-27: 850 mg via INTRAVENOUS
  Filled 2020-08-27: qty 17

## 2020-08-27 MED ORDER — OXALIPLATIN CHEMO INJECTION 100 MG/20ML
85.0000 mg/m2 | Freq: Once | INTRAVENOUS | Status: AC
  Administered 2020-08-27: 175 mg via INTRAVENOUS
  Filled 2020-08-27: qty 35

## 2020-08-27 MED ORDER — SODIUM CHLORIDE 0.9 % IV SOLN
2400.0000 mg/m2 | INTRAVENOUS | Status: DC
  Administered 2020-08-27: 5000 mg via INTRAVENOUS
  Filled 2020-08-27: qty 100

## 2020-08-27 MED ORDER — LEUCOVORIN CALCIUM INJECTION 350 MG
400.0000 mg/m2 | Freq: Once | INTRAVENOUS | Status: AC
  Administered 2020-08-27: 832 mg via INTRAVENOUS
  Filled 2020-08-27: qty 41.6

## 2020-08-27 MED ORDER — DEXTROSE 5 % IV SOLN
Freq: Once | INTRAVENOUS | Status: AC
  Filled 2020-08-27: qty 250

## 2020-08-27 MED ORDER — SODIUM CHLORIDE 0.9% FLUSH
10.0000 mL | Freq: Once | INTRAVENOUS | Status: AC
  Administered 2020-08-27: 10 mL
  Filled 2020-08-27: qty 10

## 2020-08-27 MED ORDER — SODIUM CHLORIDE 0.9 % IV SOLN
16.0000 mg | Freq: Once | INTRAVENOUS | Status: AC
  Administered 2020-08-27: 16 mg via INTRAVENOUS
  Filled 2020-08-27: qty 8

## 2020-08-27 MED ORDER — NAPROXEN 500 MG PO TABS: 500.0000 mg | ORAL_TABLET | Freq: Two times a day (BID) | ORAL | 2 refills | Status: DC | PRN

## 2020-08-27 MED ORDER — SODIUM CHLORIDE 0.9 % IV SOLN
10.0000 mg | Freq: Once | INTRAVENOUS | Status: AC
  Administered 2020-08-27: 10 mg via INTRAVENOUS
  Filled 2020-08-27: qty 10

## 2020-08-27 NOTE — Patient Instructions (Signed)
[facility] Discharge Instructions for Patients Receiving Chemotherapy  Today you received the following chemotherapy agents: oxaliplatin/leucovorin/fluorouracil.  To help prevent nausea and vomiting after your treatment, we encourage you to take your nausea medication as directed.  If you develop nausea and vomiting that is not controlled by your nausea medication, call the clinic.   BELOW ARE SYMPTOMS THAT SHOULD BE REPORTED IMMEDIATELY:  *FEVER GREATER THAN 100.5 F  *CHILLS WITH OR WITHOUT FEVER  NAUSEA AND VOMITING THAT IS NOT CONTROLLED WITH YOUR NAUSEA MEDICATION  *UNUSUAL SHORTNESS OF BREATH  *UNUSUAL BRUISING OR BLEEDING  TENDERNESS IN MOUTH AND THROAT WITH OR WITHOUT PRESENCE OF ULCERS  *URINARY PROBLEMS  *BOWEL PROBLEMS  UNUSUAL RASH Items with * indicate a potential emergency and should be followed up as soon as possible.  Feel free to call the clinic should you have any questions or concerns. The clinic phone number is [phone].  Please show the CHEMO ALERT CARD at check-in to the Emergency Department and triage nurse.   

## 2020-08-27 NOTE — Progress Notes (Signed)
  [place] OFFICE PROGRESS NOTE   Diagnosis: Rectal cancer  INTERVAL HISTORY:   Shane [name] returns as scheduled. He completed another cycle of FOLFOX on 08/13/2020. No nausea/vomiting, mouth sores, or diarrhea. He has cold sensitivity for 2 days following chemotherapy. No other neuropathy symptoms. He reports mild soreness in the mouth. He continues to have rectal and hemorrhoid pain relieved with hydrocodone and naproxen. He is having bowel movements. Objective:  Vital signs in last 24 hours:  Blood pressure (!) 154/78, pulse 81, temperature 97.8 F (36.6 C), temperature source Tympanic, resp. rate 18, height 5\' 10"  (1.778 m), weight 191 lb 8 oz (86.9 kg), SpO2 100 %.    HEENT: No thrush or ulcers, mild hyperpigmentation at the palate Resp: Lungs clear bilaterally Cardio: Regular rate and rhythm GI: No hepatomegaly, nontender, no mass Vascular: No leg edema  Skin: Palms without erythema  Portacath/PICC-without erythema  Lab Results:  Lab Results  Component Value Date   WBC 4.8 08/27/2020   HGB 8.2 (L) 08/27/2020   HCT 25.7 (L) 08/27/2020   MCV 69.6 (L) 08/27/2020   PLT 409 (H) 08/27/2020   NEUTROABS 2.2 08/27/2020    CMP  Lab Results  Component Value Date   NA 135 08/13/2020   K 3.9 08/13/2020   CL 106 08/13/2020   CO2 22 08/13/2020   GLUCOSE 94 08/13/2020   BUN 17 08/13/2020   CREATININE 0.83 08/13/2020   CALCIUM 9.5 08/13/2020   PROT 7.5 08/13/2020   ALBUMIN 3.0 (L) 08/13/2020   AST 21 08/13/2020   ALT 15 08/13/2020   ALKPHOS 101 08/13/2020   BILITOT 0.4 08/13/2020   GFRNONAA >60 08/13/2020   GFRAA >60 07/15/2020    Lab Results  Component Value Date   CEA1 6.76 (H) 07/04/2020    Medications: I have reviewed the patient's current medications.   Assessment/Plan:  1. Rectal cancer  CT abdomen/pelvis 06/27/2020-irregular masslike thickening of the rectum, increased colonic stool, perirectal infiltration into the presacral space,  tiny bibasilar pulmonary nodules  CT chest 07/12/2020-multiple small pulmonary nodules, largest 3 mm in the left apex  MRI pelvis 07/12/2020-tumor at 11.4 centimeters from the anal verge, 6.6 cm from the internal sphincter, tumor extends through the right lateral rectal wall and directly involves an adjacent loop of distal sigmoid colon with invasion of anterior peritoneal reflection and involvement of the right seminal vesicle, no lymphadenopathy.  T4N0  Colonoscopy 07/17/2020-mass at 12 cm from the anal verge, completely obstructing mass at 23 cm from the anal verge, large internal hemorrhoids, biopsy confirmed adenocarcinoma  Cycle 1 FOLFOX 07/30/2020  Cycle 2 FOLFOX 08/13/2020  Cycle 3 FOLFOX 08/27/2020  2.  Iron deficiency anemia secondary to #1 3.  Hemorrhoids 4.  Rectal pain secondary to hemorrhoids versus the primary rectal tumor 5.  Constipation secondary to #1, improved with a stool softener and laxative regimen 6.  Small indeterminate pulmonary nodules 7.  Arthritis    Disposition: Shane [name] has completed 2 cycles of FOLFOX. He has tolerated the chemotherapy well. He will complete cycle 3 today. He continues to have rectal pain. I will refill his prescription for hydrocodone and also give him a prescription for naproxen. Shane [name] will receive an influenza vaccine when he is here on 08/29/2020. He will return for an office visit in the next cycle of chemotherapy in 2 weeks. Betsy Coder, MD  08/27/2020  8:45 AM

## 2020-08-27 NOTE — Patient Instructions (Signed)

## 2020-08-29 ENCOUNTER — Inpatient Hospital Stay: Payer: 59

## 2020-08-29 ENCOUNTER — Other Ambulatory Visit: Payer: Self-pay

## 2020-08-29 VITALS — BP 135/64 | HR 78 | Temp 98.8°F | Resp 18

## 2020-08-29 DIAGNOSIS — C20 Malignant neoplasm of rectum: Secondary | ICD-10-CM

## 2020-08-29 DIAGNOSIS — Z23 Encounter for immunization: Secondary | ICD-10-CM

## 2020-08-29 DIAGNOSIS — Z5111 Encounter for antineoplastic chemotherapy: Secondary | ICD-10-CM | POA: Diagnosis not present

## 2020-08-29 MED ORDER — INFLUENZA VAC A&B SA ADJ QUAD 0.5 ML IM PRSY
PREFILLED_SYRINGE | INTRAMUSCULAR | Status: AC
  Filled 2020-08-29: qty 0.5

## 2020-08-29 MED ORDER — HEPARIN SOD (PORK) LOCK FLUSH 100 UNIT/ML IV SOLN
500.0000 [IU] | Freq: Once | INTRAVENOUS | Status: AC | PRN
  Administered 2020-08-29: 500 [IU]
  Filled 2020-08-29: qty 5

## 2020-08-29 MED ORDER — INFLUENZA VAC A&B SA ADJ QUAD 0.5 ML IM PRSY
0.5000 mL | PREFILLED_SYRINGE | Freq: Once | INTRAMUSCULAR | Status: AC
  Administered 2020-08-29: 0.5 mL via INTRAMUSCULAR

## 2020-08-29 MED ORDER — SODIUM CHLORIDE 0.9% FLUSH
10.0000 mL | INTRAVENOUS | Status: DC | PRN
  Administered 2020-08-29: 10 mL
  Filled 2020-08-29: qty 10

## 2020-09-06 ENCOUNTER — Encounter: Payer: 59 | Admitting: Family

## 2020-09-06 ENCOUNTER — Other Ambulatory Visit: Payer: Self-pay | Admitting: Oncology

## 2020-09-09 ENCOUNTER — Inpatient Hospital Stay: Payer: 59 | Admitting: Oncology

## 2020-09-09 ENCOUNTER — Inpatient Hospital Stay: Payer: 59

## 2020-09-09 ENCOUNTER — Inpatient Hospital Stay: Payer: 59 | Attending: Physician Assistant

## 2020-09-09 ENCOUNTER — Other Ambulatory Visit: Payer: Self-pay

## 2020-09-09 VITALS — BP 142/72 | HR 80 | Temp 97.0°F | Resp 16 | Ht 70.0 in | Wt 186.4 lb

## 2020-09-09 DIAGNOSIS — Z5111 Encounter for antineoplastic chemotherapy: Secondary | ICD-10-CM | POA: Insufficient documentation

## 2020-09-09 DIAGNOSIS — C20 Malignant neoplasm of rectum: Secondary | ICD-10-CM

## 2020-09-09 LAB — CBC WITH DIFFERENTIAL (CANCER CENTER ONLY)
Abs Immature Granulocytes: 0.02 10*3/uL (ref 0.00–0.07)
Basophils Absolute: 0.1 10*3/uL (ref 0.0–0.1)
Basophils Relative: 2 %
Eosinophils Absolute: 0.2 10*3/uL (ref 0.0–0.5)
Eosinophils Relative: 3 %
HCT: 27.7 % — ABNORMAL LOW (ref 39.0–52.0)
Hemoglobin: 9.2 g/dL — ABNORMAL LOW (ref 13.0–17.0)
Immature Granulocytes: 0 %
Lymphocytes Relative: 27 %
Lymphs Abs: 1.2 10*3/uL (ref 0.7–4.0)
MCH: 24.1 pg — ABNORMAL LOW (ref 26.0–34.0)
MCHC: 33.2 g/dL (ref 30.0–36.0)
MCV: 72.5 fL — ABNORMAL LOW (ref 80.0–100.0)
Monocytes Absolute: 1.1 10*3/uL — ABNORMAL HIGH (ref 0.1–1.0)
Monocytes Relative: 24 %
Neutro Abs: 2 10*3/uL (ref 1.7–7.7)
Neutrophils Relative %: 44 %
Platelet Count: 252 10*3/uL (ref 150–400)
RBC: 3.82 MIL/uL — ABNORMAL LOW (ref 4.22–5.81)
RDW: 23.2 % — ABNORMAL HIGH (ref 11.5–15.5)
WBC Count: 4.6 10*3/uL (ref 4.0–10.5)
nRBC: 0 % (ref 0.0–0.2)

## 2020-09-09 LAB — CMP (CANCER CENTER ONLY)
ALT: 14 U/L (ref 0–44)
AST: 22 U/L (ref 15–41)
Albumin: 3.2 g/dL — ABNORMAL LOW (ref 3.5–5.0)
Alkaline Phosphatase: 114 U/L (ref 38–126)
Anion gap: 7 (ref 5–15)
BUN: 17 mg/dL (ref 8–23)
CO2: 24 mmol/L (ref 22–32)
Calcium: 9.3 mg/dL (ref 8.9–10.3)
Chloride: 109 mmol/L (ref 98–111)
Creatinine: 0.94 mg/dL (ref 0.61–1.24)
GFR, Estimated: >60 mL/min (ref >60)
Glucose, Bld: 109 mg/dL — ABNORMAL HIGH (ref 70–99)
Potassium: 3.7 mmol/L (ref 3.5–5.1)
Sodium: 140 mmol/L (ref 135–145)
Total Bilirubin: 0.5 mg/dL (ref 0.3–1.2)
Total Protein: 6.8 g/dL (ref 6.5–8.1)

## 2020-09-09 MED ORDER — FLUOROURACIL CHEMO INJECTION 2.5 GM/50ML
400.0000 mg/m2 | Freq: Once | INTRAVENOUS | Status: AC
  Administered 2020-09-09: 850 mg via INTRAVENOUS
  Filled 2020-09-09: qty 17

## 2020-09-09 MED ORDER — SODIUM CHLORIDE 0.9 % IV SOLN
16.0000 mg | Freq: Once | INTRAVENOUS | Status: AC
  Administered 2020-09-09: 16 mg via INTRAVENOUS
  Filled 2020-09-09: qty 8

## 2020-09-09 MED ORDER — DEXTROSE 5 % IV SOLN
Freq: Once | INTRAVENOUS | Status: AC
  Filled 2020-09-09: qty 250

## 2020-09-09 MED ORDER — SODIUM CHLORIDE 0.9 % IV SOLN
10.0000 mg | Freq: Once | INTRAVENOUS | Status: AC
  Administered 2020-09-09: 10 mg via INTRAVENOUS
  Filled 2020-09-09: qty 10

## 2020-09-09 MED ORDER — OXALIPLATIN CHEMO INJECTION 100 MG/20ML
85.0000 mg/m2 | Freq: Once | INTRAVENOUS | Status: AC
  Administered 2020-09-09: 175 mg via INTRAVENOUS
  Filled 2020-09-09: qty 35

## 2020-09-09 MED ORDER — SODIUM CHLORIDE 0.9% FLUSH
10.0000 mL | Freq: Once | INTRAVENOUS | Status: AC
  Administered 2020-09-09: 10 mL
  Filled 2020-09-09: qty 10

## 2020-09-09 MED ORDER — SODIUM CHLORIDE 0.9 % IV SOLN
2400.0000 mg/m2 | INTRAVENOUS | Status: DC
  Administered 2020-09-09: 5000 mg via INTRAVENOUS
  Filled 2020-09-09: qty 100

## 2020-09-09 MED ORDER — LEUCOVORIN CALCIUM INJECTION 350 MG
400.0000 mg/m2 | Freq: Once | INTRAVENOUS | Status: AC
  Administered 2020-09-09: 832 mg via INTRAVENOUS
  Filled 2020-09-09: qty 41.6

## 2020-09-09 NOTE — Patient Instructions (Signed)
[facility] Discharge Instructions for Patients Receiving Chemotherapy  Today you received the following chemotherapy agents Oxaliplatin (ELOXATIN), Leucovorin & Flourouracil (ADRUCIL).  To help prevent nausea and vomiting after your treatment, we encourage you to take your nausea medication as prescribed.   If you develop nausea and vomiting that is not controlled by your nausea medication, call the clinic.   BELOW ARE SYMPTOMS THAT SHOULD BE REPORTED IMMEDIATELY:  *FEVER GREATER THAN 100.5 F  *CHILLS WITH OR WITHOUT FEVER  NAUSEA AND VOMITING THAT IS NOT CONTROLLED WITH YOUR NAUSEA MEDICATION  *UNUSUAL SHORTNESS OF BREATH  *UNUSUAL BRUISING OR BLEEDING  TENDERNESS IN MOUTH AND THROAT WITH OR WITHOUT PRESENCE OF ULCERS  *URINARY PROBLEMS  *BOWEL PROBLEMS  UNUSUAL RASH Items with * indicate a potential emergency and should be followed up as soon as possible.  Feel free to call the clinic should you have any questions or concerns. The clinic phone number is [phone].  Please show the CHEMO ALERT CARD at check-in to the Emergency Department and triage nurse.   

## 2020-09-09 NOTE — Patient Instructions (Signed)

## 2020-09-09 NOTE — Progress Notes (Signed)
  [place] OFFICE PROGRESS NOTE   Diagnosis: Pancreas cancer  INTERVAL HISTORY:   Shane [name] returns as scheduled.  He completed another cycle of FOLFOX on 08/27/2020.  No nausea/vomiting or mouth sores.  He has prolonged cold sensitivity, but no other neuropathy symptoms.  "Hemorrhoid "pain has improved.  He had diarrhea when taking MiraLAX.  He reports a good appetite.  Objective:  Vital signs in last 24 hours:  Blood pressure (!) 142/72, pulse 80, temperature (!) 97 F (36.1 C), temperature source Tympanic, resp. rate 16, height 5\' 10"  (1.778 m), weight 186 lb 6.4 oz (84.6 kg), SpO2 100 %.    HEENT: Mild hyperpigmentation, no thrush or ulcers Resp: Lungs clear bilaterally Cardio: Regular rate and rhythm GI: No hepatosplenomegaly, no mass, nontender Vascular: No leg edema  Skin: Dryness of the hands  Portacath/PICC-without erythema  Lab Results:  Lab Results  Component Value Date   WBC 4.6 09/09/2020   HGB 9.2 (L) 09/09/2020   HCT 27.7 (L) 09/09/2020   MCV 72.5 (L) 09/09/2020   PLT 252 09/09/2020   NEUTROABS 2.0 09/09/2020    CMP  Lab Results  Component Value Date   NA 137 08/27/2020   K 4.2 08/27/2020   CL 108 08/27/2020   CO2 26 08/27/2020   GLUCOSE 97 08/27/2020   BUN 17 08/27/2020   CREATININE 0.85 08/27/2020   CALCIUM 9.2 08/27/2020   PROT 6.9 08/27/2020   ALBUMIN 2.8 (L) 08/27/2020   AST 19 08/27/2020   ALT 14 08/27/2020   ALKPHOS 99 08/27/2020   BILITOT 0.3 08/27/2020   GFRNONAA >60 08/27/2020   GFRAA >60 07/15/2020    Lab Results  Component Value Date   CEA1 6.76 (H) 07/04/2020     Medications: I have reviewed the patient's current medications.   Assessment/Plan: 1. Rectal cancer  CT abdomen/pelvis 06/27/2020-irregular masslike thickening of the rectum, increased colonic stool, perirectal infiltration into the presacral space, tiny bibasilar pulmonary nodules  CT chest 07/12/2020-multiple small pulmonary nodules,  largest 3 mm in the left apex  MRI pelvis 07/12/2020-tumor at 11.4 centimeters from the anal verge, 6.6 cm from the internal sphincter, tumor extends through the right lateral rectal wall and directly involves an adjacent loop of distal sigmoid colon with invasion of anterior peritoneal reflection and involvement of the right seminal vesicle, no lymphadenopathy.  T4N0  Colonoscopy 07/17/2020-mass at 12 cm from the anal verge, completely obstructing mass at 23 cm from the anal verge, large internal hemorrhoids, biopsy confirmed adenocarcinoma  Cycle 1 FOLFOX 07/30/2020  Cycle 2 FOLFOX 08/13/2020  Cycle 3 FOLFOX 08/27/2020  Cycle 4 FOLFOX 09/09/2020  2.  Iron deficiency anemia secondary to #1 3.  Hemorrhoids 4.  Rectal pain secondary to hemorrhoids versus the primary rectal tumor 5.  Constipation secondary to #1, improved with a stool softener and laxative regimen 6.  Small indeterminate pulmonary nodules 7.  Arthritis   Disposition: Shane [name] appears stable.  He has completed 3 cycles of FOLFOX.  Pain has improved.  He has tolerated the chemotherapy well.  He will complete cycle 4 FOLFOX today.  Shane [name] will return for an office visit and chemotherapy in 2 weeks.  Betsy Coder, MD  09/09/2020  8:28 AM

## 2020-09-10 ENCOUNTER — Telehealth: Payer: Self-pay | Admitting: Oncology

## 2020-09-10 NOTE — Telephone Encounter (Signed)
Scheduled appointment per 11/1 los. Called patient, no answer. Left message for patient with appointment date and time.

## 2020-09-11 ENCOUNTER — Other Ambulatory Visit: Payer: Self-pay

## 2020-09-11 ENCOUNTER — Inpatient Hospital Stay: Payer: 59

## 2020-09-11 VITALS — BP 129/64 | HR 81 | Temp 98.8°F | Resp 18

## 2020-09-11 DIAGNOSIS — Z5111 Encounter for antineoplastic chemotherapy: Secondary | ICD-10-CM | POA: Diagnosis not present

## 2020-09-11 DIAGNOSIS — C20 Malignant neoplasm of rectum: Secondary | ICD-10-CM

## 2020-09-11 MED ORDER — HEPARIN SOD (PORK) LOCK FLUSH 100 UNIT/ML IV SOLN
500.0000 [IU] | Freq: Once | INTRAVENOUS | Status: AC | PRN
  Administered 2020-09-11: 500 [IU]
  Filled 2020-09-11: qty 5

## 2020-09-11 MED ORDER — SODIUM CHLORIDE 0.9% FLUSH
10.0000 mL | INTRAVENOUS | Status: DC | PRN
  Administered 2020-09-11: 10 mL
  Filled 2020-09-11: qty 10

## 2020-09-11 NOTE — Patient Instructions (Signed)

## 2020-09-22 ENCOUNTER — Other Ambulatory Visit: Payer: Self-pay | Admitting: Oncology

## 2020-09-24 ENCOUNTER — Other Ambulatory Visit: Payer: Self-pay | Admitting: *Deleted

## 2020-09-24 ENCOUNTER — Inpatient Hospital Stay (HOSPITAL_BASED_OUTPATIENT_CLINIC_OR_DEPARTMENT_OTHER): Payer: 59 | Admitting: Oncology

## 2020-09-24 ENCOUNTER — Inpatient Hospital Stay: Payer: 59

## 2020-09-24 ENCOUNTER — Other Ambulatory Visit: Payer: Self-pay

## 2020-09-24 VITALS — BP 158/64 | HR 82 | Temp 97.0°F | Resp 16 | Ht 70.0 in | Wt 191.1 lb

## 2020-09-24 DIAGNOSIS — C20 Malignant neoplasm of rectum: Secondary | ICD-10-CM | POA: Diagnosis not present

## 2020-09-24 DIAGNOSIS — Z5111 Encounter for antineoplastic chemotherapy: Secondary | ICD-10-CM | POA: Diagnosis not present

## 2020-09-24 LAB — CBC WITH DIFFERENTIAL (CANCER CENTER ONLY)
Abs Immature Granulocytes: 0.01 10*3/uL (ref 0.00–0.07)
Basophils Absolute: 0.1 10*3/uL (ref 0.0–0.1)
Basophils Relative: 3 %
Eosinophils Absolute: 0.2 10*3/uL (ref 0.0–0.5)
Eosinophils Relative: 5 %
HCT: 30.3 % — ABNORMAL LOW (ref 39.0–52.0)
Hemoglobin: 10 g/dL — ABNORMAL LOW (ref 13.0–17.0)
Immature Granulocytes: 0 %
Lymphocytes Relative: 33 %
Lymphs Abs: 1.2 10*3/uL (ref 0.7–4.0)
MCH: 25.6 pg — ABNORMAL LOW (ref 26.0–34.0)
MCHC: 33 g/dL (ref 30.0–36.0)
MCV: 77.5 fL — ABNORMAL LOW (ref 80.0–100.0)
Monocytes Absolute: 0.8 10*3/uL (ref 0.1–1.0)
Monocytes Relative: 22 %
Neutro Abs: 1.4 10*3/uL — ABNORMAL LOW (ref 1.7–7.7)
Neutrophils Relative %: 37 %
Platelet Count: 193 10*3/uL (ref 150–400)
RBC: 3.91 MIL/uL — ABNORMAL LOW (ref 4.22–5.81)
RDW: 23.5 % — ABNORMAL HIGH (ref 11.5–15.5)
WBC Count: 3.8 10*3/uL — ABNORMAL LOW (ref 4.0–10.5)
nRBC: 0 % (ref 0.0–0.2)

## 2020-09-24 LAB — CMP (CANCER CENTER ONLY)
ALT: 16 U/L (ref 0–44)
AST: 23 U/L (ref 15–41)
Albumin: 3.2 g/dL — ABNORMAL LOW (ref 3.5–5.0)
Alkaline Phosphatase: 95 U/L (ref 38–126)
Anion gap: 7 (ref 5–15)
BUN: 20 mg/dL (ref 8–23)
CO2: 22 mmol/L (ref 22–32)
Calcium: 8.8 mg/dL — ABNORMAL LOW (ref 8.9–10.3)
Chloride: 111 mmol/L (ref 98–111)
Creatinine: 0.87 mg/dL (ref 0.61–1.24)
GFR, Estimated: >60 mL/min (ref >60)
Glucose, Bld: 92 mg/dL (ref 70–99)
Potassium: 3.7 mmol/L (ref 3.5–5.1)
Sodium: 140 mmol/L (ref 135–145)
Total Bilirubin: 0.5 mg/dL (ref 0.3–1.2)
Total Protein: 6.4 g/dL — ABNORMAL LOW (ref 6.5–8.1)

## 2020-09-24 LAB — CEA (IN HOUSE-CHCC): CEA (CHCC-In House): 20.41 ng/mL — ABNORMAL HIGH (ref 0.00–5.00)

## 2020-09-24 MED ORDER — PEGFILGRASTIM-CBQV 6 MG/0.6ML ~~LOC~~ SOSY: 6.0000 mg | PREFILLED_SYRINGE | SUBCUTANEOUS | 2 refills | Status: DC

## 2020-09-24 MED ORDER — SODIUM CHLORIDE 0.9 % IV SOLN
16.0000 mg | Freq: Once | INTRAVENOUS | Status: AC
  Administered 2020-09-24: 16 mg via INTRAVENOUS
  Filled 2020-09-24: qty 8

## 2020-09-24 MED ORDER — SODIUM CHLORIDE 0.9 % IV SOLN
2400.0000 mg/m2 | INTRAVENOUS | Status: DC
  Administered 2020-09-24: 5000 mg via INTRAVENOUS
  Filled 2020-09-24: qty 100

## 2020-09-24 MED ORDER — SODIUM CHLORIDE 0.9% FLUSH
10.0000 mL | Freq: Once | INTRAVENOUS | Status: AC
  Administered 2020-09-24: 10 mL
  Filled 2020-09-24: qty 10

## 2020-09-24 MED ORDER — HEPARIN SOD (PORK) LOCK FLUSH 100 UNIT/ML IV SOLN
500.0000 [IU] | Freq: Once | INTRAVENOUS | Status: DC | PRN
  Filled 2020-09-24: qty 5

## 2020-09-24 MED ORDER — SODIUM CHLORIDE 0.9 % IV SOLN
10.0000 mg | Freq: Once | INTRAVENOUS | Status: AC
  Administered 2020-09-24: 10 mg via INTRAVENOUS
  Filled 2020-09-24: qty 10

## 2020-09-24 MED ORDER — LEUCOVORIN CALCIUM INJECTION 350 MG
400.0000 mg/m2 | Freq: Once | INTRAVENOUS | Status: AC
  Administered 2020-09-24: 832 mg via INTRAVENOUS
  Filled 2020-09-24: qty 41.6

## 2020-09-24 MED ORDER — OXALIPLATIN CHEMO INJECTION 100 MG/20ML
65.0000 mg/m2 | Freq: Once | INTRAVENOUS | Status: AC
  Administered 2020-09-24: 135 mg via INTRAVENOUS
  Filled 2020-09-24: qty 20

## 2020-09-24 MED ORDER — SODIUM CHLORIDE 0.9% FLUSH
10.0000 mL | INTRAVENOUS | Status: DC | PRN
  Filled 2020-09-24: qty 10

## 2020-09-24 MED ORDER — ALTEPLASE 2 MG IJ SOLR
INTRAMUSCULAR | Status: AC
  Filled 2020-09-24: qty 2

## 2020-09-24 MED ORDER — DEXTROSE 5 % IV SOLN
Freq: Once | INTRAVENOUS | Status: AC
  Filled 2020-09-24: qty 250

## 2020-09-24 NOTE — Patient Instructions (Signed)
[facility] Discharge Instructions for Patients Receiving Chemotherapy  Today you received the following chemotherapy agents velcade  To help prevent nausea and vomiting after your treatment, we encourage you to take your nausea medication as directed.   If you develop nausea and vomiting that is not controlled by your nausea medication, call the clinic.   BELOW ARE SYMPTOMS THAT SHOULD BE REPORTED IMMEDIATELY:  *FEVER GREATER THAN 100.5 F  *CHILLS WITH OR WITHOUT FEVER  NAUSEA AND VOMITING THAT IS NOT CONTROLLED WITH YOUR NAUSEA MEDICATION  *UNUSUAL SHORTNESS OF BREATH  *UNUSUAL BRUISING OR BLEEDING  TENDERNESS IN MOUTH AND THROAT WITH OR WITHOUT PRESENCE OF ULCERS  *URINARY PROBLEMS  *BOWEL PROBLEMS  UNUSUAL RASH Items with * indicate a potential emergency and should be followed up as soon as possible.  Feel free to call the clinic should you have any questions or concerns. The clinic phone number is [phone].  Please show the CHEMO ALERT CARD at check-in to the Emergency Department and triage nurse.   

## 2020-09-24 NOTE — Progress Notes (Signed)
[place] OFFICE PROGRESS NOTE   Diagnosis: Rectal cancer  INTERVAL HISTORY:   Mr. Barnett completed another cycle of FOLFOX on 09/09/2020.  He reports mild diarrhea for 2 days following chemotherapy.  He had cold sensitivity for 2 days following chemotherapy.  No neuropathy symptoms at present.  Rectal pain has improved.  Objective:  Vital signs in last 24 hours:  Blood pressure (!) 158/64, pulse 82, temperature (!) 97 F (36.1 C), temperature source Tympanic, resp. rate 16, height 5\' 10"  (1.778 m), weight 191 lb 1.6 oz (86.7 kg), SpO2 100 %.    HEENT: White coat over the tongue, no buccal thrush or ulcers Resp: Lungs clear bilaterally Cardio: Regular rate and rhythm GI: No mass, nontender, no hepatosplenomegaly Vascular: No leg edema Neuro: Mild loss of vibratory sense at the fingertips bilaterally Skin: Palms without erythema   Portacath/PICC-without erythema  Lab Results:  Lab Results  Component Value Date   WBC 3.8 (L) 09/24/2020   HGB 10.0 (L) 09/24/2020   HCT 30.3 (L) 09/24/2020   MCV 77.5 (L) 09/24/2020   PLT 193 09/24/2020   NEUTROABS 1.4 (L) 09/24/2020    CMP  Lab Results  Component Value Date   NA 140 09/09/2020   K 3.7 09/09/2020   CL 109 09/09/2020   CO2 24 09/09/2020   GLUCOSE 109 (H) 09/09/2020   BUN 17 09/09/2020   CREATININE 0.94 09/09/2020   CALCIUM 9.3 09/09/2020   PROT 6.8 09/09/2020   ALBUMIN 3.2 (L) 09/09/2020   AST 22 09/09/2020   ALT 14 09/09/2020   ALKPHOS 114 09/09/2020   BILITOT 0.5 09/09/2020   GFRNONAA >60 09/09/2020   GFRAA >60 07/15/2020    Lab Results  Component Value Date   CEA1 6.76 (H) 07/04/2020    Medications: I have reviewed the patient's current medications.   Assessment/Plan: 1. Rectal cancer  CT abdomen/pelvis 06/27/2020-irregular masslike thickening of the rectum, increased colonic stool, perirectal infiltration into the presacral space, tiny bibasilar pulmonary nodules  CT chest  07/12/2020-multiple small pulmonary nodules, largest 3 mm in the left apex  MRI pelvis 07/12/2020-tumor at 11.4 centimeters from the anal verge, 6.6 cm from the internal sphincter, tumor extends through the right lateral rectal wall and directly involves an adjacent loop of distal sigmoid colon with invasion of anterior peritoneal reflection and involvement of the right seminal vesicle, no lymphadenopathy.  T4N0  Colonoscopy 07/17/2020-mass at 12 cm from the anal verge, completely obstructing mass at 23 cm from the anal verge, large internal hemorrhoids, biopsy confirmed adenocarcinoma  Cycle 1 FOLFOX 07/30/2020  Cycle 2 FOLFOX 08/13/2020  Cycle 3 FOLFOX 08/27/2020  Cycle 4 FOLFOX 09/09/2020  Cycle 5 FOLFOX 09/24/2020, 5-FU bolus held, oxaliplatin dose reduced secondary to neutropenia  2.  Iron deficiency anemia secondary to #1 3.  Hemorrhoids 4.  Rectal pain secondary to hemorrhoids versus the primary rectal tumor 5.  Constipation secondary to #1, improved with a stool softener and laxative regimen 6.  Small indeterminate pulmonary nodules 7.  Arthritis 8.  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 Decoste has completed 4 cycles of FOLFOX.  He has tolerated the chemotherapy well.  Rectal pain has improved.  He will complete cycle 5 FOLFOX today.  He has mild neutropenia.  We discussed the risk of infection with further chemo therapy.  We also discussed the addition of G-CSF.  I reviewed potential toxicities associated with G-CSF.  He agrees to proceed with chemotherapy, but his insurance company will only approve home G-CSF.  He agrees to complete another cycle of chemotherapy today with a dose reduction of oxaliplatin and elimination of the 5-FU bolus.  He knows to contact us for a fever, chills, or other symptoms of an infection.  Mr. Stannard will return for an office visit and chemotherapy in 2  weeks.  Betsy Coder, MD  09/24/2020  8:50 AM

## 2020-09-24 NOTE — Progress Notes (Signed)
Ok to treat with ANC 1.4 per Dr. Benay Spice.

## 2020-09-24 NOTE — Progress Notes (Signed)
Per managed care: His insurance requires Udenyca to be administered by patient with script going to [facility].  Script sent.

## 2020-09-25 ENCOUNTER — Telehealth: Payer: Self-pay | Admitting: Oncology

## 2020-09-25 ENCOUNTER — Other Ambulatory Visit: Payer: Self-pay | Admitting: *Deleted

## 2020-09-25 MED ORDER — PEGFILGRASTIM INJECTION 6 MG/0.6ML ~~LOC~~: PREFILLED_SYRINGE | SUBCUTANEOUS | 4 refills | Status: DC

## 2020-09-25 NOTE — Telephone Encounter (Signed)
Scheduled appointments per 11/16 los. Called patient, no answer. Left message for patient with appointment date and time.

## 2020-09-25 NOTE — Progress Notes (Signed)
Informed by managed care that per [facility] Udenyca needs to be changed to [facility] and sent to [place]. D/C'd Ellen Henri and sent new script for Neulasta.

## 2020-09-26 ENCOUNTER — Inpatient Hospital Stay: Payer: 59

## 2020-09-26 ENCOUNTER — Other Ambulatory Visit: Payer: Self-pay

## 2020-09-26 ENCOUNTER — Telehealth: Payer: Self-pay | Admitting: *Deleted

## 2020-09-26 VITALS — BP 132/70 | HR 84 | Resp 16

## 2020-09-26 DIAGNOSIS — C20 Malignant neoplasm of rectum: Secondary | ICD-10-CM

## 2020-09-26 DIAGNOSIS — Z5111 Encounter for antineoplastic chemotherapy: Secondary | ICD-10-CM | POA: Diagnosis not present

## 2020-09-26 MED ORDER — HEPARIN SOD (PORK) LOCK FLUSH 100 UNIT/ML IV SOLN
500.0000 [IU] | Freq: Once | INTRAVENOUS | Status: AC | PRN
  Administered 2020-09-26: 500 [IU]
  Filled 2020-09-26: qty 5

## 2020-09-26 MED ORDER — SODIUM CHLORIDE 0.9% FLUSH
10.0000 mL | INTRAVENOUS | Status: DC | PRN
  Administered 2020-09-26: 10 mL
  Filled 2020-09-26: qty 10

## 2020-09-26 NOTE — Telephone Encounter (Signed)
Call from [place] to confirm cycles of chemo and how many doses are needed per month. This was confirmed.

## 2020-10-06 ENCOUNTER — Other Ambulatory Visit: Payer: Self-pay | Admitting: Oncology

## 2020-10-07 ENCOUNTER — Inpatient Hospital Stay: Payer: 59

## 2020-10-07 ENCOUNTER — Other Ambulatory Visit: Payer: Self-pay

## 2020-10-07 ENCOUNTER — Ambulatory Visit: Payer: 59

## 2020-10-07 ENCOUNTER — Other Ambulatory Visit: Payer: 59

## 2020-10-07 ENCOUNTER — Inpatient Hospital Stay (HOSPITAL_BASED_OUTPATIENT_CLINIC_OR_DEPARTMENT_OTHER): Payer: 59 | Admitting: Oncology

## 2020-10-07 ENCOUNTER — Other Ambulatory Visit: Payer: Self-pay | Admitting: *Deleted

## 2020-10-07 ENCOUNTER — Ambulatory Visit: Payer: 59 | Admitting: Nurse Practitioner

## 2020-10-07 ENCOUNTER — Telehealth: Payer: Self-pay | Admitting: Oncology

## 2020-10-07 VITALS — BP 141/54 | HR 85 | Temp 97.5°F | Resp 16 | Ht 70.0 in | Wt 181.7 lb

## 2020-10-07 DIAGNOSIS — C20 Malignant neoplasm of rectum: Secondary | ICD-10-CM

## 2020-10-07 DIAGNOSIS — Z5111 Encounter for antineoplastic chemotherapy: Secondary | ICD-10-CM | POA: Diagnosis not present

## 2020-10-07 LAB — CBC WITH DIFFERENTIAL (CANCER CENTER ONLY)
Abs Immature Granulocytes: 0.01 10*3/uL (ref 0.00–0.07)
Basophils Absolute: 0.1 10*3/uL (ref 0.0–0.1)
Basophils Relative: 1 %
Eosinophils Absolute: 0.1 10*3/uL (ref 0.0–0.5)
Eosinophils Relative: 1 %
HCT: 34.8 % — ABNORMAL LOW (ref 39.0–52.0)
Hemoglobin: 11.6 g/dL — ABNORMAL LOW (ref 13.0–17.0)
Immature Granulocytes: 0 %
Lymphocytes Relative: 11 %
Lymphs Abs: 0.8 10*3/uL (ref 0.7–4.0)
MCH: 26.9 pg (ref 26.0–34.0)
MCHC: 33.3 g/dL (ref 30.0–36.0)
MCV: 80.7 fL (ref 80.0–100.0)
Monocytes Absolute: 1.5 10*3/uL — ABNORMAL HIGH (ref 0.1–1.0)
Monocytes Relative: 21 %
Neutro Abs: 4.6 10*3/uL (ref 1.7–7.7)
Neutrophils Relative %: 66 %
Platelet Count: 163 10*3/uL (ref 150–400)
RBC: 4.31 MIL/uL (ref 4.22–5.81)
RDW: 22.1 % — ABNORMAL HIGH (ref 11.5–15.5)
WBC Count: 7.1 10*3/uL (ref 4.0–10.5)
nRBC: 0 % (ref 0.0–0.2)

## 2020-10-07 LAB — CMP (CANCER CENTER ONLY)
ALT: 67 U/L — ABNORMAL HIGH (ref 0–44)
AST: 52 U/L — ABNORMAL HIGH (ref 15–41)
Albumin: 3.3 g/dL — ABNORMAL LOW (ref 3.5–5.0)
Alkaline Phosphatase: 97 U/L (ref 38–126)
Anion gap: 8 (ref 5–15)
BUN: 17 mg/dL (ref 8–23)
CO2: 22 mmol/L (ref 22–32)
Calcium: 10 mg/dL (ref 8.9–10.3)
Chloride: 106 mmol/L (ref 98–111)
Creatinine: 0.96 mg/dL (ref 0.61–1.24)
GFR, Estimated: >60 mL/min (ref >60)
Glucose, Bld: 108 mg/dL — ABNORMAL HIGH (ref 70–99)
Potassium: 3.6 mmol/L (ref 3.5–5.1)
Sodium: 136 mmol/L (ref 135–145)
Total Bilirubin: 0.8 mg/dL (ref 0.3–1.2)
Total Protein: 7.2 g/dL (ref 6.5–8.1)

## 2020-10-07 LAB — CEA (IN HOUSE-CHCC): CEA (CHCC-In House): 33.7 ng/mL — ABNORMAL HIGH (ref 0.00–5.00)

## 2020-10-07 MED ORDER — SODIUM CHLORIDE 0.9 % IV SOLN
16.0000 mg | Freq: Once | INTRAVENOUS | Status: AC
  Administered 2020-10-07: 16 mg via INTRAVENOUS
  Filled 2020-10-07: qty 8

## 2020-10-07 MED ORDER — SODIUM CHLORIDE 0.9% FLUSH
10.0000 mL | INTRAVENOUS | Status: DC | PRN
  Filled 2020-10-07: qty 10

## 2020-10-07 MED ORDER — FLUOROURACIL CHEMO INJECTION 2.5 GM/50ML
400.0000 mg/m2 | Freq: Once | INTRAVENOUS | Status: AC
  Administered 2020-10-07: 800 mg via INTRAVENOUS
  Filled 2020-10-07: qty 16

## 2020-10-07 MED ORDER — LEUCOVORIN CALCIUM INJECTION 350 MG
400.0000 mg/m2 | Freq: Once | INTRAVENOUS | Status: AC
  Administered 2020-10-07: 808 mg via INTRAVENOUS
  Filled 2020-10-07: qty 40.4

## 2020-10-07 MED ORDER — OXALIPLATIN CHEMO INJECTION 100 MG/20ML
85.0000 mg/m2 | Freq: Once | INTRAVENOUS | Status: AC
  Administered 2020-10-07: 170 mg via INTRAVENOUS
  Filled 2020-10-07: qty 34

## 2020-10-07 MED ORDER — SODIUM CHLORIDE 0.9% FLUSH
10.0000 mL | Freq: Once | INTRAVENOUS | Status: AC
  Administered 2020-10-07: 10 mL
  Filled 2020-10-07: qty 10

## 2020-10-07 MED ORDER — SODIUM CHLORIDE 0.9 % IV SOLN
10.0000 mg | Freq: Once | INTRAVENOUS | Status: AC
  Administered 2020-10-07: 10 mg via INTRAVENOUS
  Filled 2020-10-07: qty 10

## 2020-10-07 MED ORDER — SODIUM CHLORIDE 0.9 % IV SOLN
2475.0000 mg/m2 | INTRAVENOUS | Status: DC
  Administered 2020-10-07: 5000 mg via INTRAVENOUS
  Filled 2020-10-07: qty 100

## 2020-10-07 MED ORDER — HEPARIN SOD (PORK) LOCK FLUSH 100 UNIT/ML IV SOLN
500.0000 [IU] | Freq: Once | INTRAVENOUS | Status: DC | PRN
  Filled 2020-10-07: qty 5

## 2020-10-07 MED ORDER — DEXTROSE 5 % IV SOLN
Freq: Once | INTRAVENOUS | Status: AC
  Filled 2020-10-07: qty 250

## 2020-10-07 NOTE — Progress Notes (Signed)
[place] OFFICE PROGRESS NOTE   Diagnosis: Rectal cancer  INTERVAL HISTORY:   Mr. Froh completed another cycle of FOLFOX on 09/24/2020.  He reports having diarrhea while the 5-FU infusion was running.  He did not take medical therapy for the diarrhea.  No nausea/vomiting or neuropathy symptoms.  Objective:  Vital signs in last 24 hours:  Blood pressure (!) 141/54, pulse 85, temperature (!) 97.5 F (36.4 C), temperature source Tympanic, resp. rate 16, height 5\' 10"  (1.778 m), weight 181 lb 11.2 oz (82.4 kg), SpO2 98 %.    HEENT: No thrush or ulcers Resp: Lungs clear bilaterally Cardio: Regular rate and rhythm GI: No hepatosplenomegaly, no mass, nontender Vascular: No leg edema Neuro: Very mild loss of vibratory sense at the fingertips bilaterally Skin: Dryness of the hands  Portacath/PICC-without erythema  Lab Results:  Lab Results  Component Value Date   WBC 7.1 10/07/2020   HGB 11.6 (L) 10/07/2020   HCT 34.8 (L) 10/07/2020   MCV 80.7 10/07/2020   PLT 163 10/07/2020   NEUTROABS 4.6 10/07/2020    CMP  Lab Results  Component Value Date   NA 140 09/24/2020   K 3.7 09/24/2020   CL 111 09/24/2020   CO2 22 09/24/2020   GLUCOSE 92 09/24/2020   BUN 20 09/24/2020   CREATININE 0.87 09/24/2020   CALCIUM 8.8 (L) 09/24/2020   PROT 6.4 (L) 09/24/2020   ALBUMIN 3.2 (L) 09/24/2020   AST 23 09/24/2020   ALT 16 09/24/2020   ALKPHOS 95 09/24/2020   BILITOT 0.5 09/24/2020   GFRNONAA >60 09/24/2020   GFRAA >60 07/15/2020    Lab Results  Component Value Date   CEA1 20.41 (H) 09/24/2020     Medications: I have reviewed the patient's current medications.   Assessment/Plan: 1. Rectal cancer  CT abdomen/pelvis 06/27/2020-irregular masslike thickening of the rectum, increased colonic stool, perirectal infiltration into the presacral space, tiny bibasilar pulmonary nodules  CT chest 07/12/2020-multiple small pulmonary nodules, largest 3 mm in the left  apex  MRI pelvis 07/12/2020-tumor at 11.4 centimeters from the anal verge, 6.6 cm from the internal sphincter, tumor extends through the right lateral rectal wall and directly involves an adjacent loop of distal sigmoid colon with invasion of anterior peritoneal reflection and involvement of the right seminal vesicle, no lymphadenopathy.  T4N0  Colonoscopy 07/17/2020-mass at 12 cm from the anal verge, completely obstructing mass at 23 cm from the anal verge, large internal hemorrhoids, biopsy confirmed adenocarcinoma  Cycle 1 FOLFOX 07/30/2020  Cycle 2 FOLFOX 08/13/2020  Cycle 3 FOLFOX 08/27/2020  Cycle 4 FOLFOX 09/09/2020  Cycle 5 FOLFOX 09/24/2020, 5-FU bolus held, oxaliplatin dose reduced secondary to neutropenia  Cycle 6 FOLFOX 10/07/2020, 5-FU bolus and full dose oxaliplatin, Neulasta  2.  Iron deficiency anemia secondary to #1 3.  Hemorrhoids 4.  Rectal pain secondary to hemorrhoids versus the primary rectal tumor 5.  Constipation secondary to #1, improved with a stool softener and laxative regimen 6.  Small indeterminate pulmonary nodules 7.  Arthritis 8.  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 Blyth appears well.  He will complete cycle 6 FOLFOX today.  The 5-FU bolus will be added back and the oxaliplatin will be given at full dose today.  I adjusted the chemotherapy doses based on his current weight.  Mr. Popoff will self administer Neulasta on day 3.  He will return for an  office visit and chemotherapy in 2 weeks.  He will try Imodium for diarrhea.  Betsy Coder, MD  10/07/2020  8:48 AM

## 2020-10-07 NOTE — Telephone Encounter (Signed)
Scheduled appointments per 11/29 los. Will have updated calendar printed for patient via RN Kasandra Knudsen.

## 2020-10-07 NOTE — Patient Instructions (Signed)

## 2020-10-07 NOTE — Patient Instructions (Signed)
[facility] Discharge Instructions for Patients Receiving Chemotherapy  Today you received the following chemotherapy agents Oxaliplatin (ELOXATIN), Leucovorin & Flourouracil (ADRUCIL).  To help prevent nausea and vomiting after your treatment, we encourage you to take your nausea medication as prescribed.   If you develop nausea and vomiting that is not controlled by your nausea medication, call the clinic.   BELOW ARE SYMPTOMS THAT SHOULD BE REPORTED IMMEDIATELY:  *FEVER GREATER THAN 100.5 F  *CHILLS WITH OR WITHOUT FEVER  NAUSEA AND VOMITING THAT IS NOT CONTROLLED WITH YOUR NAUSEA MEDICATION  *UNUSUAL SHORTNESS OF BREATH  *UNUSUAL BRUISING OR BLEEDING  TENDERNESS IN MOUTH AND THROAT WITH OR WITHOUT PRESENCE OF ULCERS  *URINARY PROBLEMS  *BOWEL PROBLEMS  UNUSUAL RASH Items with * indicate a potential emergency and should be followed up as soon as possible.  Feel free to call the clinic should you have any questions or concerns. The clinic phone number is [phone].  Please show the CHEMO ALERT CARD at check-in to the Emergency Department and triage nurse.   

## 2020-10-07 NOTE — Progress Notes (Signed)
CEA higher today. Added to repeat on 10/21/20 per Dr. Benay Spice.

## 2020-10-08 ENCOUNTER — Ambulatory Visit (INDEPENDENT_AMBULATORY_CARE_PROVIDER_SITE_OTHER): Payer: 59 | Admitting: Orthopaedic Surgery

## 2020-10-08 ENCOUNTER — Encounter: Payer: Self-pay | Admitting: Orthopaedic Surgery

## 2020-10-08 DIAGNOSIS — Z9889 Other specified postprocedural states: Secondary | ICD-10-CM

## 2020-10-08 NOTE — Progress Notes (Signed)
Office Visit Note   Patient: Shane [name]           Date of Birth: Jun 05, 1951           MRN: [id number] Visit Date: 10/08/2020              Requested by: Marrian Salvage, [address] [place],  [place] [address] PCP: Marrian Salvage, FNP   Assessment & Plan: Visit Diagnoses:  1. S/P carpal tunnel release     Plan: Impression is 11 months status post right carpal tunnel release without full return of sensation throughout the median nerve distribution.  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  From our standpoint, he has reached MMI and based on findings, we rate him at 40% for his hand based on review of [place] industrial [employer].  He will follow up with [place] as needed.  Total face to face encounter time was greater than 25 minutes and over half of this time was spent in counseling and/or coordination of care.  Follow-Up Instructions: Return if symptoms worsen or fail to improve.   Orders:  No orders of the defined types were placed in this encounter.  No orders of the defined types were placed in this encounter.     Procedures: No procedures performed   Clinical Data: No additional findings.   Subjective: Chief Complaint  Patient presents with  . Right Hand - Pain    HPI patient is a pleasant 69 year old gentleman who comes in today approximately 11 months status post right carpal tunnel release, 11/06/2019.  It was noted on previous nerve conduction studies that he had severe compression of the median nerve as well as axonal injury and demyelination.  He has been very slow at regaining sensation.  He is still having numbness and tingling to the thumb, index and long fingers of the right hand.  He notes that this has slightly improved over time, however.  Review of Systems as detailed in HPI.  All others reviewed and are negative.   Objective: Vital  Signs: There were no vitals taken for this visit.  Physical Exam well-developed well-nourished gentleman in no acute distress.  Alert oriented x3.  Ortho Exam right hand exam reveals full range of motion near full strength.  Full grip strength.  He still has moderate thenar atrophy.  He has decreased sensation to the thumb, index and long fingers.  Specialty Comments:  No specialty comments available.  Imaging: No new imaging   PMFS History: Patient Active Problem List   Diagnosis Date Noted  . Rectal carcinoma ([place]) 07/18/2020  . Diarrhea 07/09/2020  . Rectal bleeding 07/09/2020  . Iron deficiency anemia 07/04/2020  . Rectal mass 07/04/2020  . Goals of care, counseling/discussion 07/04/2020  . S/P carpal tunnel release 01/30/2020  . Right carpal tunnel syndrome 09/19/2018  . 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 Abnormality of gait 02/23/2018  . Degenerative cervical disc 03/24/2016  . 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 Routine general medical examination at a health care facility 09/02/2015  . Numbness and tingling in hands 08/27/2015   Past Medical History:  Diagnosis Date  . Allergy   . Arthritis     Family History  Problem Relation Age of Onset  . Healthy Mother   . Hypertension Father   . Stroke Father   . Colon cancer Neg  Hx   . Esophageal cancer Neg Hx   . Stomach cancer Neg Hx   . Rectal cancer Neg Hx     Past Surgical History:  Procedure Laterality Date  . IR IMAGING GUIDED PORT INSERTION  07/25/2020   Social History   Occupational History  . Occupation: Armed forces technical officer  Tobacco Use  . Smoking status: Former Smoker    Packs/day: 1.00    Years: 10.00    Pack years: 10.00    Types: Cigarettes    Quit date: 08/09/1978    Years since quitting: 42.1  . Smokeless tobacco: Never Used  Vaping Use  . Vaping Use: Never used  Substance and Sexual Activity  . Alcohol use: No    Alcohol/week: 0.0  standard drinks  . Drug use: No  . Sexual activity: Not on file

## 2020-10-09 ENCOUNTER — Ambulatory Visit: Payer: 59

## 2020-10-09 ENCOUNTER — Other Ambulatory Visit: Payer: Self-pay

## 2020-10-09 ENCOUNTER — Other Ambulatory Visit: Payer: 59

## 2020-10-09 ENCOUNTER — Inpatient Hospital Stay: Payer: 59 | Attending: Physician Assistant

## 2020-10-09 ENCOUNTER — Ambulatory Visit: Payer: 59 | Admitting: Nurse Practitioner

## 2020-10-09 VITALS — BP 162/74 | HR 77 | Temp 98.6°F | Resp 18

## 2020-10-09 DIAGNOSIS — C20 Malignant neoplasm of rectum: Secondary | ICD-10-CM | POA: Diagnosis present

## 2020-10-09 DIAGNOSIS — Z5111 Encounter for antineoplastic chemotherapy: Secondary | ICD-10-CM | POA: Diagnosis present

## 2020-10-09 MED ORDER — SODIUM CHLORIDE 0.9% FLUSH
10.0000 mL | INTRAVENOUS | Status: DC | PRN
  Administered 2020-10-09: 10 mL
  Filled 2020-10-09: qty 10

## 2020-10-09 MED ORDER — HEPARIN SOD (PORK) LOCK FLUSH 100 UNIT/ML IV SOLN
500.0000 [IU] | Freq: Once | INTRAVENOUS | Status: AC | PRN
  Administered 2020-10-09: 500 [IU]
  Filled 2020-10-09: qty 5

## 2020-10-20 ENCOUNTER — Other Ambulatory Visit: Payer: Self-pay | Admitting: Oncology

## 2020-10-21 ENCOUNTER — Inpatient Hospital Stay: Payer: 59

## 2020-10-21 ENCOUNTER — Telehealth: Payer: Self-pay

## 2020-10-21 ENCOUNTER — Other Ambulatory Visit: Payer: Self-pay | Admitting: *Deleted

## 2020-10-21 ENCOUNTER — Other Ambulatory Visit: Payer: Self-pay

## 2020-10-21 ENCOUNTER — Encounter: Payer: Self-pay | Admitting: Oncology

## 2020-10-21 ENCOUNTER — Inpatient Hospital Stay (HOSPITAL_BASED_OUTPATIENT_CLINIC_OR_DEPARTMENT_OTHER): Payer: 59 | Admitting: Oncology

## 2020-10-21 VITALS — BP 141/67 | HR 72 | Temp 98.4°F | Resp 16 | Ht 70.0 in | Wt 184.8 lb

## 2020-10-21 DIAGNOSIS — Z95828 Presence of other vascular implants and grafts: Secondary | ICD-10-CM

## 2020-10-21 DIAGNOSIS — C20 Malignant neoplasm of rectum: Secondary | ICD-10-CM

## 2020-10-21 DIAGNOSIS — Z5111 Encounter for antineoplastic chemotherapy: Secondary | ICD-10-CM | POA: Diagnosis not present

## 2020-10-21 LAB — CMP (CANCER CENTER ONLY)
ALT: 26 U/L (ref 0–44)
AST: 28 U/L (ref 15–41)
Albumin: 3.2 g/dL — ABNORMAL LOW (ref 3.5–5.0)
Alkaline Phosphatase: 140 U/L — ABNORMAL HIGH (ref 38–126)
Anion gap: 6 (ref 5–15)
BUN: 20 mg/dL (ref 8–23)
CO2: 25 mmol/L (ref 22–32)
Calcium: 9.3 mg/dL (ref 8.9–10.3)
Chloride: 108 mmol/L (ref 98–111)
Creatinine: 0.86 mg/dL (ref 0.61–1.24)
GFR, Estimated: >60 mL/min (ref >60)
Glucose, Bld: 105 mg/dL — ABNORMAL HIGH (ref 70–99)
Potassium: 3.4 mmol/L — ABNORMAL LOW (ref 3.5–5.1)
Sodium: 139 mmol/L (ref 135–145)
Total Bilirubin: 0.3 mg/dL (ref 0.3–1.2)
Total Protein: 6.7 g/dL (ref 6.5–8.1)

## 2020-10-21 LAB — CBC WITH DIFFERENTIAL (CANCER CENTER ONLY)
Abs Immature Granulocytes: 0.26 10*3/uL — ABNORMAL HIGH (ref 0.00–0.07)
Basophils Absolute: 0.2 10*3/uL — ABNORMAL HIGH (ref 0.0–0.1)
Basophils Relative: 2 %
Eosinophils Absolute: 0.3 10*3/uL (ref 0.0–0.5)
Eosinophils Relative: 3 %
HCT: 31.4 % — ABNORMAL LOW (ref 39.0–52.0)
Hemoglobin: 10.8 g/dL — ABNORMAL LOW (ref 13.0–17.0)
Immature Granulocytes: 3 %
Lymphocytes Relative: 17 %
Lymphs Abs: 1.5 10*3/uL (ref 0.7–4.0)
MCH: 27.6 pg (ref 26.0–34.0)
MCHC: 34.4 g/dL (ref 30.0–36.0)
MCV: 80.3 fL (ref 80.0–100.0)
Monocytes Absolute: 1 10*3/uL (ref 0.1–1.0)
Monocytes Relative: 12 %
Neutro Abs: 5.4 10*3/uL (ref 1.7–7.7)
Neutrophils Relative %: 63 %
Platelet Count: 193 10*3/uL (ref 150–400)
RBC: 3.91 MIL/uL — ABNORMAL LOW (ref 4.22–5.81)
RDW: 19.9 % — ABNORMAL HIGH (ref 11.5–15.5)
WBC Count: 8.5 10*3/uL (ref 4.0–10.5)
nRBC: 0 % (ref 0.0–0.2)

## 2020-10-21 LAB — CEA (IN HOUSE-CHCC): CEA (CHCC-In House): 30.43 ng/mL — ABNORMAL HIGH (ref 0.00–5.00)

## 2020-10-21 MED ORDER — LEUCOVORIN CALCIUM INJECTION 350 MG
400.0000 mg/m2 | Freq: Once | INTRAVENOUS | Status: AC
  Administered 2020-10-21: 808 mg via INTRAVENOUS
  Filled 2020-10-21: qty 40.4

## 2020-10-21 MED ORDER — SODIUM CHLORIDE 0.9 % IV SOLN
2400.0000 mg/m2 | INTRAVENOUS | Status: DC
  Administered 2020-10-21: 4850 mg via INTRAVENOUS
  Filled 2020-10-21: qty 97

## 2020-10-21 MED ORDER — PALONOSETRON HCL INJECTION 0.25 MG/5ML
INTRAVENOUS | Status: AC
  Filled 2020-10-21: qty 5

## 2020-10-21 MED ORDER — FLUOROURACIL CHEMO INJECTION 2.5 GM/50ML
400.0000 mg/m2 | Freq: Once | INTRAVENOUS | Status: AC
  Administered 2020-10-21: 800 mg via INTRAVENOUS
  Filled 2020-10-21: qty 16

## 2020-10-21 MED ORDER — OXALIPLATIN CHEMO INJECTION 100 MG/20ML
85.0000 mg/m2 | Freq: Once | INTRAVENOUS | Status: AC
  Administered 2020-10-21: 170 mg via INTRAVENOUS
  Filled 2020-10-21: qty 34

## 2020-10-21 MED ORDER — SODIUM CHLORIDE 0.9 % IV SOLN
10.0000 mg | Freq: Once | INTRAVENOUS | Status: AC
  Administered 2020-10-21: 10 mg via INTRAVENOUS
  Filled 2020-10-21: qty 10

## 2020-10-21 MED ORDER — LIDOCAINE-PRILOCAINE 2.5-2.5 % EX CREA: 1.0000 "application " | TOPICAL_CREAM | CUTANEOUS | 1 refills | Status: DC | PRN

## 2020-10-21 MED ORDER — SODIUM CHLORIDE 0.9% FLUSH
10.0000 mL | INTRAVENOUS | Status: DC | PRN
  Filled 2020-10-21: qty 10

## 2020-10-21 MED ORDER — DEXTROSE 5 % IV SOLN
Freq: Once | INTRAVENOUS | Status: AC
  Filled 2020-10-21: qty 250

## 2020-10-21 MED ORDER — ONDANSETRON HCL 40 MG/20ML IJ SOLN
16.0000 mg | Freq: Once | INTRAMUSCULAR | Status: AC
  Administered 2020-10-21: 16 mg via INTRAVENOUS
  Filled 2020-10-21: qty 8

## 2020-10-21 MED ORDER — FLUCONAZOLE 100 MG PO TABS: 100.0000 mg | ORAL_TABLET | Freq: Every day | ORAL | 0 refills | Status: AC

## 2020-10-21 NOTE — Telephone Encounter (Signed)
-----   Message from Ladell Pier, MD sent at 10/21/2020  1:57 PM EST ----- Please call patient, the CEA is better today, follow-up as scheduled

## 2020-10-21 NOTE — Patient Instructions (Signed)
[facility] Discharge Instructions for Patients Receiving Chemotherapy  Today you received the following chemotherapy agents: Oxaliplatin/Leucovorin/Fluorouracil.  To help prevent nausea and vomiting after your treatment, we encourage you to take your nausea medication as directed.   If you develop nausea and vomiting that is not controlled by your nausea medication, call the clinic.   BELOW ARE SYMPTOMS THAT SHOULD BE REPORTED IMMEDIATELY:  *FEVER GREATER THAN 100.5 F  *CHILLS WITH OR WITHOUT FEVER  NAUSEA AND VOMITING THAT IS NOT CONTROLLED WITH YOUR NAUSEA MEDICATION  *UNUSUAL SHORTNESS OF BREATH  *UNUSUAL BRUISING OR BLEEDING  TENDERNESS IN MOUTH AND THROAT WITH OR WITHOUT PRESENCE OF ULCERS  *URINARY PROBLEMS  *BOWEL PROBLEMS  UNUSUAL RASH Items with * indicate a potential emergency and should be followed up as soon as possible.  Feel free to call the clinic should you have any questions or concerns. The clinic phone number is [phone].  Please show the [place] at check-in to the Emergency Department and triage nurse.

## 2020-10-21 NOTE — Patient Instructions (Signed)

## 2020-10-21 NOTE — Telephone Encounter (Signed)
Pt made aware of CEA results

## 2020-10-21 NOTE — Progress Notes (Signed)
[facility] OFFICE PROGRESS NOTE   Diagnosis: Rectal cancer  INTERVAL HISTORY:   Shane [name] completed another cycle of FOLFOX on 10/07/2020.  He had an episode of nausea and vomiting on day 5.  He had pain in the back and a knee for several days after receiving Neulasta.  The pain was relieved with naproxen and hydrocodone.  He has cold sensitivity for several days following chemotherapy.  No neuropathy symptoms at present.  He reports altered taste.  Objective:  Vital signs in last 24 hours:  Blood pressure (!) 141/67, pulse 72, temperature 98.4 F (36.9 C), temperature source Tympanic, resp. rate 16, height 5\' 10"  (1.778 m), weight 184 lb 12.8 oz (83.8 kg), SpO2 100 %.    HEENT: White coat over the tongue, no buccal thrush or ulcers Resp: Lungs clear bilaterally Cardio: Regular rate and rhythm GI: No hepatosplenomegaly, nontender Vascular: No leg edema Neuro: Mild loss of vibratory sense at the fingertips bilaterally   Portacath/PICC-without erythema  Lab Results:  Lab Results  Component Value Date   WBC 8.5 10/21/2020   HGB 10.8 (L) 10/21/2020   HCT 31.4 (L) 10/21/2020   MCV 80.3 10/21/2020   PLT 193 10/21/2020   NEUTROABS 5.4 10/21/2020    CMP  Lab Results  Component Value Date   NA 136 10/07/2020   K 3.6 10/07/2020   CL 106 10/07/2020   CO2 22 10/07/2020   GLUCOSE 108 (H) 10/07/2020   BUN 17 10/07/2020   CREATININE 0.96 10/07/2020   CALCIUM 10.0 10/07/2020   PROT 7.2 10/07/2020   ALBUMIN 3.3 (L) 10/07/2020   AST 52 (H) 10/07/2020   ALT 67 (H) 10/07/2020   ALKPHOS 97 10/07/2020   BILITOT 0.8 10/07/2020   GFRNONAA >60 10/07/2020   GFRAA >60 07/15/2020    Lab Results  Component Value Date   CEA1 33.70 (H) 10/07/2020    No results found for: INR  Imaging:  No results found.  Medications: I have reviewed the patient's current medications.   Assessment/Plan:  1. Rectal cancer  CT abdomen/pelvis 06/27/2020-irregular masslike  thickening of the rectum, increased colonic stool, perirectal infiltration into the presacral space, tiny bibasilar pulmonary nodules  CT chest 07/12/2020-multiple small pulmonary nodules, largest 3 mm in the left apex  MRI pelvis 07/12/2020-tumor at 11.4 centimeters from the anal verge, 6.6 cm from the internal sphincter, tumor extends through the right lateral rectal wall and directly involves an adjacent loop of distal sigmoid colon with invasion of anterior peritoneal reflection and involvement of the right seminal vesicle, no lymphadenopathy.  T4N0  Colonoscopy 07/17/2020-mass at 12 cm from the anal verge, completely obstructing mass at 23 cm from the anal verge, large internal hemorrhoids, biopsy confirmed adenocarcinoma  Cycle 1 FOLFOX 07/30/2020  Cycle 2 FOLFOX 08/13/2020  Cycle 3 FOLFOX 08/27/2020  Cycle 4 FOLFOX 09/09/2020  Cycle 5 FOLFOX 09/24/2020, 5-FU bolus held, oxaliplatin dose reduced secondary to neutropenia  Cycle 6 FOLFOX 10/07/2020, 5-FU bolus and full dose oxaliplatin, Neulasta  Cycle 7 FOLFOX 10/21/2020, Neulasta  2.  Iron deficiency anemia secondary to #1 3.  Hemorrhoids 4.  Rectal pain secondary to hemorrhoids versus the primary rectal tumor 5.  Constipation secondary to #1, improved with a stool softener and laxative regimen 6.  Small indeterminate pulmonary nodules 7.  Arthritis 8.  Neutropenia secondary to chemotherapy 9.  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  He has  tolerated the chemotherapy well.  He has minimal neuropathy symptoms.  He will complete cycle 7 FOLFOX today.  The CEA has been higher over the past month.  We will follow up on the CEA from today.  He will undergo restaging CTs after this cycle of chemotherapy.  Shane [name] will return for an office visit and the final planned cycle of FOLFOX in 2 weeks.  Betsy Coder,  MD  10/21/2020  8:25 AM

## 2020-10-22 ENCOUNTER — Encounter: Payer: Self-pay | Admitting: *Deleted

## 2020-10-22 NOTE — Progress Notes (Signed)
Signed [employer]ness form returned to [place], [place]

## 2020-10-23 ENCOUNTER — Other Ambulatory Visit: Payer: Self-pay

## 2020-10-23 ENCOUNTER — Inpatient Hospital Stay: Payer: 59

## 2020-10-23 VITALS — BP 138/72 | HR 72 | Temp 98.2°F | Resp 17

## 2020-10-23 DIAGNOSIS — C20 Malignant neoplasm of rectum: Secondary | ICD-10-CM

## 2020-10-23 MED ORDER — SODIUM CHLORIDE 0.9% FLUSH
10.0000 mL | INTRAVENOUS | Status: DC | PRN
Start: 2020-10-23 — End: 2020-10-23
  Filled 2020-10-23: qty 10

## 2020-10-23 MED ORDER — HEPARIN SOD (PORK) LOCK FLUSH 100 UNIT/ML IV SOLN
500.0000 [IU] | Freq: Once | INTRAVENOUS | Status: DC | PRN
  Filled 2020-10-23: qty 5

## 2020-10-24 ENCOUNTER — Telehealth: Payer: Self-pay | Admitting: Orthopaedic Surgery

## 2020-10-24 NOTE — Telephone Encounter (Signed)
Received L/T disability paperwork    Forwarding to [place] today

## 2020-10-25 ENCOUNTER — Encounter: Payer: Self-pay | Admitting: Oncology

## 2020-10-25 ENCOUNTER — Encounter: Payer: Self-pay | Admitting: *Deleted

## 2020-10-30 ENCOUNTER — Telehealth: Payer: Self-pay | Admitting: Orthopaedic Surgery

## 2020-10-30 NOTE — Telephone Encounter (Signed)
[employer] [employer] forms received. Sent to [employer]. 

## 2020-10-31 ENCOUNTER — Ambulatory Visit (HOSPITAL_COMMUNITY)
Admission: RE | Admit: 2020-10-31 | Discharge: 2020-10-31 | Disposition: A | Discharge status: Home / Self Care | Payer: 59 | Source: Ambulatory Visit | Attending: Oncology | Admitting: Oncology

## 2020-10-31 ENCOUNTER — Encounter (HOSPITAL_COMMUNITY): Payer: Self-pay

## 2020-10-31 ENCOUNTER — Other Ambulatory Visit: Payer: Self-pay

## 2020-10-31 DIAGNOSIS — C20 Malignant neoplasm of rectum: Secondary | ICD-10-CM | POA: Diagnosis not present

## 2020-10-31 MED ORDER — HEPARIN SOD (PORK) LOCK FLUSH 100 UNIT/ML IV SOLN: 500.0000 [IU] | Freq: Once | INTRAVENOUS | Status: AC

## 2020-10-31 MED ORDER — HEPARIN SOD (PORK) LOCK FLUSH 100 UNIT/ML IV SOLN
INTRAVENOUS | Status: AC
  Administered 2020-10-31: 500 [IU] via INTRAVENOUS
  Filled 2020-10-31: qty 5

## 2020-10-31 MED ORDER — IOHEXOL 300 MG/ML  SOLN
100.0000 mL | Freq: Once | INTRAMUSCULAR | Status: AC | PRN
  Administered 2020-10-31: 100 mL via INTRAVENOUS

## 2020-11-04 ENCOUNTER — Inpatient Hospital Stay: Payer: 59

## 2020-11-04 ENCOUNTER — Inpatient Hospital Stay (HOSPITAL_BASED_OUTPATIENT_CLINIC_OR_DEPARTMENT_OTHER): Payer: 59 | Admitting: Oncology

## 2020-11-04 ENCOUNTER — Telehealth: Payer: Self-pay | Admitting: Pharmacist

## 2020-11-04 ENCOUNTER — Other Ambulatory Visit: Payer: Self-pay

## 2020-11-04 ENCOUNTER — Telehealth: Payer: Self-pay

## 2020-11-04 ENCOUNTER — Other Ambulatory Visit: Payer: Self-pay | Admitting: Oncology

## 2020-11-04 VITALS — BP 152/75 | HR 86 | Temp 98.1°F | Resp 20 | Ht 70.0 in | Wt 185.4 lb

## 2020-11-04 DIAGNOSIS — C20 Malignant neoplasm of rectum: Secondary | ICD-10-CM | POA: Diagnosis not present

## 2020-11-04 DIAGNOSIS — Z5111 Encounter for antineoplastic chemotherapy: Secondary | ICD-10-CM | POA: Diagnosis not present

## 2020-11-04 LAB — CMP (CANCER CENTER ONLY)
ALT: 27 U/L (ref 0–44)
AST: 34 U/L (ref 15–41)
Albumin: 3.6 g/dL (ref 3.5–5.0)
Alkaline Phosphatase: 129 U/L — ABNORMAL HIGH (ref 38–126)
Anion gap: 10 (ref 5–15)
BUN: 13 mg/dL (ref 8–23)
CO2: 22 mmol/L (ref 22–32)
Calcium: 9.2 mg/dL (ref 8.9–10.3)
Chloride: 106 mmol/L (ref 98–111)
Creatinine: 0.9 mg/dL (ref 0.61–1.24)
GFR, Estimated: >60 mL/min (ref >60)
Glucose, Bld: 101 mg/dL — ABNORMAL HIGH (ref 70–99)
Potassium: 3.8 mmol/L (ref 3.5–5.1)
Sodium: 138 mmol/L (ref 135–145)
Total Bilirubin: 0.3 mg/dL (ref 0.3–1.2)
Total Protein: 6.8 g/dL (ref 6.5–8.1)

## 2020-11-04 LAB — CBC WITH DIFFERENTIAL (CANCER CENTER ONLY)
Abs Immature Granulocytes: 0.11 10*3/uL — ABNORMAL HIGH (ref 0.00–0.07)
Basophils Absolute: 0.1 10*3/uL (ref 0.0–0.1)
Basophils Relative: 2 %
Eosinophils Absolute: 0.1 10*3/uL (ref 0.0–0.5)
Eosinophils Relative: 2 %
HCT: 31.6 % — ABNORMAL LOW (ref 39.0–52.0)
Hemoglobin: 10.7 g/dL — ABNORMAL LOW (ref 13.0–17.0)
Immature Granulocytes: 2 %
Lymphocytes Relative: 16 %
Lymphs Abs: 1.1 10*3/uL (ref 0.7–4.0)
MCH: 28.2 pg (ref 26.0–34.0)
MCHC: 33.9 g/dL (ref 30.0–36.0)
MCV: 83.2 fL (ref 80.0–100.0)
Monocytes Absolute: 1.4 10*3/uL — ABNORMAL HIGH (ref 0.1–1.0)
Monocytes Relative: 21 %
Neutro Abs: 3.9 10*3/uL (ref 1.7–7.7)
Neutrophils Relative %: 57 %
Platelet Count: 160 10*3/uL (ref 150–400)
RBC: 3.8 MIL/uL — ABNORMAL LOW (ref 4.22–5.81)
RDW: 19.3 % — ABNORMAL HIGH (ref 11.5–15.5)
WBC Count: 6.7 10*3/uL (ref 4.0–10.5)
nRBC: 0 % (ref 0.0–0.2)

## 2020-11-04 LAB — CEA (IN HOUSE-CHCC): CEA (CHCC-In House): 29.82 ng/mL — ABNORMAL HIGH (ref 0.00–5.00)

## 2020-11-04 MED ORDER — LEUCOVORIN CALCIUM INJECTION 350 MG
400.0000 mg/m2 | Freq: Once | INTRAVENOUS | Status: AC
  Administered 2020-11-04: 808 mg via INTRAVENOUS
  Filled 2020-11-04: qty 17.5

## 2020-11-04 MED ORDER — SODIUM CHLORIDE 0.9 % IV SOLN
10.0000 mg | Freq: Once | INTRAVENOUS | Status: AC
  Administered 2020-11-04: 10 mg via INTRAVENOUS
  Filled 2020-11-04: qty 10

## 2020-11-04 MED ORDER — OXALIPLATIN CHEMO INJECTION 100 MG/20ML
85.0000 mg/m2 | Freq: Once | INTRAVENOUS | Status: AC
  Administered 2020-11-04: 170 mg via INTRAVENOUS
  Filled 2020-11-04: qty 34

## 2020-11-04 MED ORDER — SODIUM CHLORIDE 0.9% FLUSH
10.0000 mL | Freq: Once | INTRAVENOUS | Status: AC
  Administered 2020-11-04: 10 mL
  Filled 2020-11-04: qty 10

## 2020-11-04 MED ORDER — CAPECITABINE 500 MG PO TABS: ORAL_TABLET | ORAL | 0 refills | Status: DC

## 2020-11-04 MED ORDER — DEXTROSE 5 % IV SOLN
Freq: Once | INTRAVENOUS | Status: AC
  Filled 2020-11-04: qty 250

## 2020-11-04 MED ORDER — SODIUM CHLORIDE 0.9 % IV SOLN
2400.0000 mg/m2 | INTRAVENOUS | Status: DC
  Administered 2020-11-04: 4850 mg via INTRAVENOUS
  Filled 2020-11-04: qty 97

## 2020-11-04 MED ORDER — SODIUM CHLORIDE 0.9 % IV SOLN
16.0000 mg | Freq: Once | INTRAVENOUS | Status: AC
  Administered 2020-11-04: 16 mg via INTRAVENOUS
  Filled 2020-11-04: qty 8

## 2020-11-04 MED ORDER — FLUOROURACIL CHEMO INJECTION 2.5 GM/50ML
400.0000 mg/m2 | Freq: Once | INTRAVENOUS | Status: AC
  Administered 2020-11-04: 800 mg via INTRAVENOUS
  Filled 2020-11-04: qty 16

## 2020-11-04 NOTE — Telephone Encounter (Signed)
Oral Oncology Pharmacist Encounter  Received new prescription for Xeloda (capecitabine) for the treatment of rectal cancer in conjunction with radiation, planned for duration of radiation.  Prescription dose and frequency assessed for appropriateness. Appropriate for therapy initiation.   CBC w/ Diff and CMP from 11/04/20 assessed, overall labs stable - no baseline dose adjustments required.  Current medication list in Epic reviewed, no relevant/significant DDIs with Xeloda identified.  Evaluated chart and no patient barriers to medication adherence noted.   Patient's insurance requires that Xeloda be filled through [employer]. Rx has been redirected to their pharmacy for filling.   Oral Oncology Clinic will continue to follow for insurance authorization, copayment issues, initial counseling and start date.  Lenord Carbo, PharmD, BCPS Hematology/Oncology Clinical Pharmacist [facility] Oral [facility]otherapy Navigation Clinic [phone] 11/04/2020 1:00 PM

## 2020-11-04 NOTE — Progress Notes (Signed)
[facility] OFFICE PROGRESS NOTE   Diagnosis: Rectal cancer  INTERVAL HISTORY:   Shane [name] completed another cycle of FOLFOX on 10/21/2020.  He reports cold sensitivity for 1-2 days following chemotherapy.  No other neuropathy symptoms.  He feels well.  No mouth sores or diarrhea.  Rectal pain remains much improved.  Objective:  Vital signs in last 24 hours:  Blood pressure (!) 152/75, pulse 86, temperature 98.1 F (36.7 C), temperature source Tympanic, resp. rate 20, height 5\' 10"  (1.778 m), weight 185 lb 6.4 oz (84.1 kg), SpO2 100 %.    HEENT: Mild white coat over the tongue, no buccal thrush Resp: Lungs clear bilaterally Cardio: Regular rate and rhythm GI: No hepatomegaly, nontender Vascular: No leg edema Neuro: Mild loss of vibratory sense at the fingertips bilaterally Skin: Mild hyperpigmentation of the hands  Portacath/PICC-without erythema  Lab Results:  Lab Results  Component Value Date   WBC 6.7 11/04/2020   HGB 10.7 (L) 11/04/2020   HCT 31.6 (L) 11/04/2020   MCV 83.2 11/04/2020   PLT 160 11/04/2020   NEUTROABS 3.9 11/04/2020    CMP  Lab Results  Component Value Date   NA 139 10/21/2020   K 3.4 (L) 10/21/2020   CL 108 10/21/2020   CO2 25 10/21/2020   GLUCOSE 105 (H) 10/21/2020   BUN 20 10/21/2020   CREATININE 0.86 10/21/2020   CALCIUM 9.3 10/21/2020   PROT 6.7 10/21/2020   ALBUMIN 3.2 (L) 10/21/2020   AST 28 10/21/2020   ALT 26 10/21/2020   ALKPHOS 140 (H) 10/21/2020   BILITOT 0.3 10/21/2020   GFRNONAA >60 10/21/2020   GFRAA >60 07/15/2020    Lab Results  Component Value Date   CEA1 30.43 (H) 10/21/2020    Medications: I have reviewed the patient's current medications.   Assessment/Plan: 1. Rectal cancer  CT abdomen/pelvis 06/27/2020-irregular masslike thickening of the rectum, increased colonic stool, perirectal infiltration into the presacral space, tiny bibasilar pulmonary nodules  CT chest 07/12/2020-multiple small  pulmonary nodules, largest 3 mm in the left apex  MRI pelvis 07/12/2020-tumor at 11.4 centimeters from the anal verge, 6.6 cm from the internal sphincter, tumor extends through the right lateral rectal wall and directly involves an adjacent loop of distal sigmoid colon with invasion of anterior peritoneal reflection and involvement of the right seminal vesicle, no lymphadenopathy.  T4N0  Colonoscopy 07/17/2020-mass at 12 cm from the anal verge, completely obstructing mass at 23 cm from the anal verge, large internal hemorrhoids, biopsy confirmed adenocarcinoma  Cycle 1 FOLFOX 07/30/2020  Cycle 2 FOLFOX 08/13/2020  Cycle 3 FOLFOX 08/27/2020  Cycle 4 FOLFOX 09/09/2020  Cycle 5 FOLFOX 09/24/2020, 5-FU bolus held, oxaliplatin dose reduced secondary to neutropenia  Cycle 6 FOLFOX 10/07/2020, 5-FU bolus and full dose oxaliplatin, Neulasta  Cycle 7 FOLFOX 10/21/2020, Neulasta  CTs 10/31/2020-no change in rectal mass, no evidence of nodal metastases, unchanged nonspecific lung nodules  Cycle 8 FOLFOX 11/04/2020  2.  Iron deficiency anemia secondary to #1 3.  Hemorrhoids 4.  Rectal pain secondary to hemorrhoids versus the primary rectal tumor 5.  Constipation secondary to #1, improved with a stool softener and laxative regimen 6.  Small indeterminate pulmonary nodules 7.  Arthritis 8.  Neutropenia secondary to chemotherapy 9.  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  The restaging CT shows no evidence of disease progression with a stable rectal mass.  He will complete  the final cycle of neoadjuvant FOLFOX today.  He continues to tolerate the FOLFOX well.  The plan is to begin concurrent capecitabine and radiation within the next 2-3 weeks.  We will arrange for a follow-up appoint with Shane [name].  I reviewed potential toxicities associated with capecitabine including the chance of  mucositis, diarrhea, hematologic toxicity, hyperpigmentation, and hand/foot syndrome.  He agrees to proceed.  Shane [name] will return for an office visit in 2 weeks.  He knows not to start capecitabine until the beginning of radiation.  Shane Coder, MD  11/04/2020  8:41 AM

## 2020-11-04 NOTE — Telephone Encounter (Signed)
Oral Oncology Patient Advocate Encounter  After completing a benefits investigation, prior authorization for Xeloda is not required at this time through [employer].  Patient is required to fill at [facility]  [facility] Patient Advocate [facility] Phone [phone] Fax [phone] 11/04/2020 1:15 PM

## 2020-11-04 NOTE — Patient Instructions (Signed)
[facility] Discharge Instructions for Patients Receiving Chemotherapy  Today you received the following chemotherapy agents: oxaliplatin, leucovorin, and fluorouracil.  To help prevent nausea and vomiting after your treatment, we encourage you to take your nausea medication as directed.   If you develop nausea and vomiting that is not controlled by your nausea medication, call the clinic.   BELOW ARE SYMPTOMS THAT SHOULD BE REPORTED IMMEDIATELY:  *FEVER GREATER THAN 100.5 F  *CHILLS WITH OR WITHOUT FEVER  NAUSEA AND VOMITING THAT IS NOT CONTROLLED WITH YOUR NAUSEA MEDICATION  *UNUSUAL SHORTNESS OF BREATH  *UNUSUAL BRUISING OR BLEEDING  TENDERNESS IN MOUTH AND THROAT WITH OR WITHOUT PRESENCE OF ULCERS  *URINARY PROBLEMS  *BOWEL PROBLEMS  UNUSUAL RASH Items with * indicate a potential emergency and should be followed up as soon as possible.  Feel free to call the clinic should you have any questions or concerns. The clinic phone number is [phone].  Please show the CHEMO ALERT CARD at check-in to the Emergency Department and triage nurse.   

## 2020-11-04 NOTE — Progress Notes (Signed)
Sent staff message to Radiation Oncology to schedule patient with Dr. Mitzi Hansen and CT simulation asap.  He is finishing his last cycle of chemo today.

## 2020-11-05 ENCOUNTER — Telehealth: Payer: Self-pay | Admitting: Oncology

## 2020-11-05 NOTE — Telephone Encounter (Signed)
Scheduled appointments per 12/27 los. Called patient, no answer. Left message with appointments date and times.

## 2020-11-06 ENCOUNTER — Other Ambulatory Visit: Payer: Self-pay | Admitting: Family

## 2020-11-06 ENCOUNTER — Other Ambulatory Visit: Payer: Self-pay

## 2020-11-06 ENCOUNTER — Inpatient Hospital Stay: Payer: 59

## 2020-11-06 DIAGNOSIS — Z5111 Encounter for antineoplastic chemotherapy: Secondary | ICD-10-CM | POA: Diagnosis not present

## 2020-11-06 DIAGNOSIS — C20 Malignant neoplasm of rectum: Secondary | ICD-10-CM

## 2020-11-06 MED ORDER — HEPARIN SOD (PORK) LOCK FLUSH 100 UNIT/ML IV SOLN
500.0000 [IU] | Freq: Once | INTRAVENOUS | Status: AC | PRN
  Administered 2020-11-06: 500 [IU]
  Filled 2020-11-06: qty 5

## 2020-11-06 MED ORDER — SODIUM CHLORIDE 0.9% FLUSH
10.0000 mL | INTRAVENOUS | Status: DC | PRN
  Administered 2020-11-06: 10 mL
  Filled 2020-11-06: qty 10

## 2020-11-11 ENCOUNTER — Telehealth: Payer: Self-pay | Admitting: Orthopaedic Surgery

## 2020-11-11 NOTE — Telephone Encounter (Signed)
10/08/20 ov note faxed [place] [employer] [phone],claim# [id number]. Spoke to patient advised ov note faxed and to follow up with them if they will process his claim with only ov note. He is aware possibility they way want form and if that's the case he will need to pay 25 and sign auth with [employer] as they have the forms.

## 2020-11-12 ENCOUNTER — Other Ambulatory Visit: Payer: Self-pay | Admitting: Family

## 2020-11-12 NOTE — Telephone Encounter (Signed)
Oral Chemotherapy Pharmacist Encounter   Attempted to reach patient to provide update and offer for initial counseling on oral medication: Xeloda (capecitabine).   No answer. Left voicemail for patient to call back to discuss details of medication acquisition and initial counseling session.  Lenord Carbo, PharmD, BCPS Hematology/Oncology Clinical Pharmacist [facility] Oral Chemotherapy Navigation Clinic [phone] 11/12/2020 2:26 PM

## 2020-11-13 ENCOUNTER — Telehealth: Payer: Self-pay

## 2020-11-13 NOTE — Telephone Encounter (Signed)
Oral Chemotherapy Pharmacist Encounter   Attempted to reach patient to provide update and offer for initial counseling on oral medication: Xeloda (capecitabine).   No answer. Patient's voicemail box is full, unable to leave message at this time.   Lenord Carbo, PharmD, BCPS Hematology/Oncology Clinical Pharmacist [facility] Oral Chemotherapy Navigation Clinic [phone] 11/13/2020 11:12 AM

## 2020-11-13 NOTE — Telephone Encounter (Signed)
Advised patient that he needs to keep his appointment on 11/18/2020 as scheduled.  He verbalized and understanding.

## 2020-11-13 NOTE — Telephone Encounter (Signed)
Oral Chemotherapy Pharmacist Encounter  I spoke with patient for overview of: Xeloda for the treatment of rectal cancer in conjunction with radiation, planned for duration of radiation.  Counseled patient on administration, dosing, side effects, monitoring, drug-food interactions, safe handling, storage, and disposal.  Patient will take Xeloda 500mg  tablets, 4 tablets (2000mg ) by mouth in AM and 3 tabs (1500mg ) by mouth in PM, within 30 minutes of finishing meals, on days of radiation only.  Xeloda and radiation start date: pending - patient knows to start Xeloda on the same day as radiation  Adverse effects of Xeloda include but are not limited to: fatigue, decreased blood counts, GI upset, diarrhea, mouth sores, and hand-foot syndrome.  Patient will obtain anti diarrheal and alert the office of 4 or more loose stools above baseline.   Reviewed with patient importance of keeping a medication schedule and plan for any missed doses. No barriers to medication adherence identified.  Medication reconciliation performed and medication/allergy list updated.  Insurance authorization for Xeloda has been obtained. Patient's insurance requires that he fill through . Medication was delivered to patient on 11/12/20.  All questions answered.  Shane [name] voiced understanding and appreciation.   Medication education handout placed in mail for patient. Patient knows to call the office with questions or concerns. Oral Chemotherapy Clinic phone number provided to patient.   [employer], PharmD, BCPS Hematology/Oncology Clinical Pharmacist [facility] Oral Chemotherapy Navigation Clinic [phone] 11/13/2020 4:08 PM

## 2020-11-15 ENCOUNTER — Telehealth: Payer: Self-pay | Admitting: *Deleted

## 2020-11-15 ENCOUNTER — Encounter: Payer: Self-pay | Admitting: General Practice

## 2020-11-15 NOTE — Progress Notes (Signed)
Was working, was on [employer] which then transitioned to long term disability.  Now it has been determined that he cannot return to his job and was released.  He now is uninsured, employer terminated insurance as of 11/08/20.  His income has decreased to [employer] retirement only.  He is concerned about affording the cost of cancer treatment given that he is uninsured.  He does have Medicare Part A.  He has been continuously covered by his employer health plan since age 70.  CSW advised him to call [place] [place] [phone]) to explore his options for adding the rest of Medicare options so he does have insurance coverage.  He is also a veteran - advised him to call [place] [facility] to determine if he may be eligible for any VA benefits.  Encouraged him to explore options quickly.  Edwyna Shell, LCSW Clinical Social Worker Phone:  [phone]

## 2020-11-15 NOTE — Telephone Encounter (Signed)
error 

## 2020-11-15 NOTE — Telephone Encounter (Deleted)
Call from [facility] to f/u on Hospice referral ordered by hospital. Need to confirm he is terminal and that Dr. Benay Spice will be his attending.

## 2020-11-18 ENCOUNTER — Inpatient Hospital Stay: Payer: Medicare Other | Attending: Physician Assistant

## 2020-11-18 ENCOUNTER — Encounter: Payer: Self-pay | Admitting: Nurse Practitioner

## 2020-11-18 ENCOUNTER — Inpatient Hospital Stay: Payer: Medicare Other

## 2020-11-18 ENCOUNTER — Inpatient Hospital Stay (HOSPITAL_BASED_OUTPATIENT_CLINIC_OR_DEPARTMENT_OTHER): Payer: Medicare Other | Admitting: Nurse Practitioner

## 2020-11-18 ENCOUNTER — Telehealth: Payer: Self-pay | Admitting: Radiation Oncology

## 2020-11-18 ENCOUNTER — Other Ambulatory Visit: Payer: 59

## 2020-11-18 ENCOUNTER — Other Ambulatory Visit: Payer: Self-pay

## 2020-11-18 VITALS — BP 152/61 | HR 84 | Temp 98.0°F | Resp 15 | Ht 70.0 in | Wt 184.5 lb

## 2020-11-18 DIAGNOSIS — C20 Malignant neoplasm of rectum: Secondary | ICD-10-CM | POA: Diagnosis not present

## 2020-11-18 DIAGNOSIS — R918 Other nonspecific abnormal finding of lung field: Secondary | ICD-10-CM | POA: Diagnosis not present

## 2020-11-18 LAB — CMP (CANCER CENTER ONLY)
ALT: 33 U/L (ref 0–44)
AST: 34 U/L (ref 15–41)
Albumin: 3.4 g/dL — ABNORMAL LOW (ref 3.5–5.0)
Alkaline Phosphatase: 125 U/L (ref 38–126)
Anion gap: 8 (ref 5–15)
BUN: 18 mg/dL (ref 8–23)
CO2: 24 mmol/L (ref 22–32)
Calcium: 9.2 mg/dL (ref 8.9–10.3)
Chloride: 107 mmol/L (ref 98–111)
Creatinine: 0.84 mg/dL (ref 0.61–1.24)
GFR, Estimated: >60 mL/min (ref >60)
Glucose, Bld: 100 mg/dL — ABNORMAL HIGH (ref 70–99)
Potassium: 3.7 mmol/L (ref 3.5–5.1)
Sodium: 139 mmol/L (ref 135–145)
Total Bilirubin: 0.5 mg/dL (ref 0.3–1.2)
Total Protein: 7 g/dL (ref 6.5–8.1)

## 2020-11-18 LAB — CBC WITH DIFFERENTIAL (CANCER CENTER ONLY)
Abs Immature Granulocytes: 0.02 10*3/uL (ref 0.00–0.07)
Basophils Absolute: 0.1 10*3/uL (ref 0.0–0.1)
Basophils Relative: 2 %
Eosinophils Absolute: 0.1 10*3/uL (ref 0.0–0.5)
Eosinophils Relative: 2 %
HCT: 31.6 % — ABNORMAL LOW (ref 39.0–52.0)
Hemoglobin: 10.8 g/dL — ABNORMAL LOW (ref 13.0–17.0)
Immature Granulocytes: 0 %
Lymphocytes Relative: 20 %
Lymphs Abs: 1 10*3/uL (ref 0.7–4.0)
MCH: 28.5 pg (ref 26.0–34.0)
MCHC: 34.2 g/dL (ref 30.0–36.0)
MCV: 83.4 fL (ref 80.0–100.0)
Monocytes Absolute: 1.5 10*3/uL — ABNORMAL HIGH (ref 0.1–1.0)
Monocytes Relative: 30 %
Neutro Abs: 2.2 10*3/uL (ref 1.7–7.7)
Neutrophils Relative %: 46 %
Platelet Count: 165 10*3/uL (ref 150–400)
RBC: 3.79 MIL/uL — ABNORMAL LOW (ref 4.22–5.81)
RDW: 18.6 % — ABNORMAL HIGH (ref 11.5–15.5)
WBC Count: 4.9 10*3/uL (ref 4.0–10.5)
nRBC: 0 % (ref 0.0–0.2)

## 2020-11-18 LAB — CEA (IN HOUSE-CHCC): CEA (CHCC-In House): 24.92 ng/mL — ABNORMAL HIGH (ref 0.00–5.00)

## 2020-11-18 MED ORDER — HYDROCODONE-ACETAMINOPHEN 5-325 MG PO TABS: 1.0000 | ORAL_TABLET | Freq: Four times a day (QID) | ORAL | 0 refills | Status: DC | PRN

## 2020-11-18 NOTE — Progress Notes (Signed)
  [facility] OFFICE PROGRESS NOTE   Diagnosis: Rectal cancer  INTERVAL HISTORY:   Shane [name] returns as scheduled.  He denies nausea/vomiting.  No diarrhea.  No rectal bleeding.  Rectal pain continues to be improved.  Objective:  Vital signs in last 24 hours:  Blood pressure (!) 152/61, pulse 84, temperature 98 F (36.7 C), temperature source Tympanic, resp. rate 15, height 5\' 10"  (1.778 m), weight 184 lb 8 oz (83.7 kg), SpO2 100 %.    HEENT: No thrush or ulcers. Resp: Lungs clear bilaterally. Cardio: Regular rate and rhythm. GI: Abdomen soft and nontender.  No hepatomegaly. Vascular: No leg edema. Skin: Palms dry appearing. Port-A-Cath without erythema.   Lab Results:  Lab Results  Component Value Date   WBC 4.9 11/18/2020   HGB 10.8 (L) 11/18/2020   HCT 31.6 (L) 11/18/2020   MCV 83.4 11/18/2020   PLT 165 11/18/2020   NEUTROABS 2.2 11/18/2020    Imaging:  No results found.  Medications: I have reviewed the patient's current medications.  Assessment/Plan: 1. Rectal cancer  CT abdomen/pelvis 06/27/2020-irregular masslike thickening of the rectum, increased colonic stool, perirectal infiltration into the presacral space, tiny bibasilar pulmonary nodules  CT chest 07/12/2020-multiple small pulmonary nodules, largest 3 mm in the left apex  MRI pelvis 07/12/2020-tumor at 11.4 centimeters from the anal verge, 6.6 cm from the internal sphincter, tumor extends through the right lateral rectal wall and directly involves an adjacent loop of distal sigmoid colon with invasion of anterior peritoneal reflection and involvement of the right seminal vesicle, no lymphadenopathy. T4N0  Colonoscopy 07/17/2020-mass at 12 cm from the anal verge, completely obstructing mass at 23 cm from the anal verge, large internal hemorrhoids, biopsy confirmed adenocarcinoma  Cycle 1 FOLFOX 07/30/2020  Cycle 2 FOLFOX 08/13/2020  Cycle 3 FOLFOX 08/27/2020  Cycle 4 FOLFOX  09/09/2020  Cycle 5 FOLFOX 09/24/2020, 5-FU bolus held, oxaliplatin dose reduced secondary to neutropenia  Cycle 6 FOLFOX 10/07/2020, 5-FU bolus and full dose oxaliplatin, Neulasta  Cycle 7 FOLFOX 10/21/2020, Neulasta  CTs 10/31/2020-no change in rectal mass, no evidence of nodal metastases, unchanged nonspecific lung nodules  Cycle 8 FOLFOX 11/04/2020  Radiation/Xeloda (start date pending)  2. Iron deficiency anemia secondary to #1 3. Hemorrhoids 4. Rectal pain secondary to hemorrhoids versus the primary rectal tumor 5. Constipation secondary to #1, improved with a stool softener and laxative regimen 6. Small indeterminate pulmonary nodules 7. Arthritis 8.  Neutropenia secondary to chemotherapy 9.  Oxaliplatin neuropathy-mild loss of vibratory sense on exam 09/24/2020, 10/07/2020, 10/21/2020, 11/04/2020   Disposition: Mr. Lofaro appears stable.  He has completed 8 cycles of FOLFOX.  He is scheduled for radiation simulation 11/20/2020.  Anticipate start date 11/25/2020.  He understands to begin Xeloda as previously prescribed on the first day of radiation, take on days of radiation only.  We again reviewed potential toxicities.  He agrees to proceed.  We reviewed the CBC and chemistry panel from today.  Labs adequate to proceed as above.  He will return for lab and follow-up the week of 12/09/2020.  He will contact the office in the interim with any problems.    Ned Card ANP/GNP-BC   11/18/2020  9:11 AM

## 2020-11-18 NOTE — Telephone Encounter (Signed)
Patient had left me a voicemail asking to r/s his appt w/Alison Brewster, [place] on 1/12 due to not having ins. When I called the patient, he decided to leave his appt as is due to wanting to file for [place].

## 2020-11-19 ENCOUNTER — Telehealth: Payer: Self-pay | Admitting: Nurse Practitioner

## 2020-11-19 NOTE — Telephone Encounter (Signed)
Scheduled appointments per 1/10 los. Spoke to patient who is aware of appointments dates and times.  

## 2020-11-20 ENCOUNTER — Other Ambulatory Visit: Payer: Self-pay

## 2020-11-20 ENCOUNTER — Encounter: Payer: Self-pay | Admitting: Radiation Oncology

## 2020-11-20 ENCOUNTER — Ambulatory Visit
Admission: RE | Admit: 2020-11-20 | Discharge: 2020-11-20 | Disposition: A | Discharge status: Home / Self Care | Payer: Managed Care, Other (non HMO) | Source: Ambulatory Visit | Attending: Radiation Oncology | Admitting: Radiation Oncology

## 2020-11-20 ENCOUNTER — Ambulatory Visit
Admission: RE | Admit: 2020-11-20 | Discharge: 2020-11-20 | Disposition: A | Discharge status: Home / Self Care | Payer: Medicare Other | Source: Ambulatory Visit | Attending: Radiation Oncology | Admitting: Radiation Oncology

## 2020-11-20 DIAGNOSIS — N281 Cyst of kidney, acquired: Secondary | ICD-10-CM | POA: Diagnosis not present

## 2020-11-20 DIAGNOSIS — R918 Other nonspecific abnormal finding of lung field: Secondary | ICD-10-CM | POA: Diagnosis not present

## 2020-11-20 DIAGNOSIS — N433 Hydrocele, unspecified: Secondary | ICD-10-CM | POA: Diagnosis not present

## 2020-11-20 DIAGNOSIS — D649 Anemia, unspecified: Secondary | ICD-10-CM | POA: Diagnosis not present

## 2020-11-20 DIAGNOSIS — I700 Atherosclerosis of aorta: Secondary | ICD-10-CM | POA: Diagnosis not present

## 2020-11-20 DIAGNOSIS — Z510 Encounter for antineoplastic radiation therapy: Secondary | ICD-10-CM | POA: Diagnosis not present

## 2020-11-20 DIAGNOSIS — C20 Malignant neoplasm of rectum: Secondary | ICD-10-CM

## 2020-11-20 DIAGNOSIS — R634 Abnormal weight loss: Secondary | ICD-10-CM | POA: Insufficient documentation

## 2020-11-20 DIAGNOSIS — Z79899 Other long term (current) drug therapy: Secondary | ICD-10-CM | POA: Diagnosis not present

## 2020-11-20 DIAGNOSIS — M129 Arthropathy, unspecified: Secondary | ICD-10-CM | POA: Diagnosis not present

## 2020-11-20 DIAGNOSIS — Z87891 Personal history of nicotine dependence: Secondary | ICD-10-CM | POA: Diagnosis not present

## 2020-11-20 DIAGNOSIS — K4090 Unilateral inguinal hernia, without obstruction or gangrene, not specified as recurrent: Secondary | ICD-10-CM | POA: Diagnosis not present

## 2020-11-20 HISTORY — DX: Malignant neoplasm of rectum: C20

## 2020-11-20 NOTE — Progress Notes (Signed)
Radiation Oncology         [phone] ________________________________  Name: Pavel Gadd        MRN: [id number]  Date of Service: 11/20/2020 DOB: Apr 18, 1951  KT:GYBWLS, [name], FNP  Ladell Pier, MD     REFERRING PHYSICIAN: Ladell Pier, MD   DIAGNOSIS: The encounter diagnosis was Rectal carcinoma [facility]).   HISTORY OF PRESENT ILLNESS: Shane [name] is a 70 y.o. male originally seen at the request of Dr. Benay Spice for a newly diagnosed mass in the rectum. The patient was found to be anemic and had had unintended weight loss.  He was seen by his PCP and underwent CT abdomen pelvis with contrast on 06/27/2020. This revealed concerns for a 7.5 x 6.1 x 5 cm mass in the rectum, he had large bilateral hydroceles left greater than right, mild prostatic enlargement, and small bilateral inguinal hernias containing fat.  Given these findings he was referred for further evaluation by oncology, he underwent CT chest with pelvis on 07/12/2020 that revealed small multiple nodules throughout the lungs the largest was 3 mm in the left pulmonary apex that were considered nonspecific, emphysematous and atherosclerotic disease was also seen.  An MRI of the pelvis was performed on 07/12/2020, but was not performed with rectal contrast protocol, it was read as an advanced cancer measuring up to 3.4 cm with bulky disease extending through the anterior and right lateral rectal wall invading an adjacent loop of distal sigmoid colon, and involving the right seminal vesicle, this was felt to be T4, no nodes were seen but this was significantly limited to the large size of the tumor, and the distance of the tumor to the internal anal sphincter was 6.6 cm.  He underwent sigmoidoscopy on 07/17/20 , and pathology confirmed adenocarcinoma. CEA of 6.76 was noted at that time. He began total neoadjuvant chemotherapy on 07/30/2020 and completed his last cycle on 11/04/2020.  He is seen today in person to discuss next steps  for chemoradiation.  Unfortunately the patient has lost his insurance due to a change in employment, and is trying to get coverage quickly with the help of social work.     PREVIOUS RADIATION THERAPY: No   PAST MEDICAL HISTORY:  Past Medical History:  Diagnosis Date  . Allergy   . Arthritis   . Rectal cancer ([place])        PAST SURGICAL HISTORY: Past Surgical History:  Procedure Laterality Date  . IR IMAGING GUIDED PORT INSERTION  07/25/2020     FAMILY HISTORY:  Family History  Problem Relation Age of Onset  . Healthy Mother   . Hypertension Father   . Stroke Father   . Colon cancer Neg Hx   . Esophageal cancer Neg Hx   . Stomach cancer Neg Hx   . Rectal cancer Neg Hx      SOCIAL HISTORY:  reports that he quit smoking about 42 years ago. His smoking use included cigarettes. He has a 10.00 pack-year smoking history. He has never used smokeless tobacco. He reports that he does not drink alcohol and does not use drugs.  The patient is married and lives in [place]. He spends time with his family and enjoys taking his 32 year old granddaughter to gymnastics. He is also very active in his church.  ALLERGIES: Patient has no known allergies.   MEDICATIONS:  Current Outpatient Medications  Medication Sig Dispense Refill  . Diclofenac Sodium 2 % SOLN Place 2 g onto the skin 2 (  two) times daily. 112 g 3  . docusate sodium (COLACE) 100 MG capsule Take 100 mg by mouth daily as needed for mild constipation.     . naproxen (NAPROSYN) 500 MG tablet Take 1 tablet (500 mg total) by mouth 2 (two) times daily as needed. 60 tablet 2  . pravastatin (PRAVACHOL) 20 MG tablet TAKE 1 TABLET(20 MG) BY MOUTH DAILY 90 tablet 3  . capecitabine (XELODA) 500 MG tablet Take 4 tablets (201m) by mouth in AM and take 3 tablets (15033m by mouth in PM (total daily dose of 350017m Take within 30 mins of meals. Take on days of radiation only. (Patient not taking: Reported on 11/20/2020) 196 tablet 0  .  HYDROcodone-acetaminophen (NORCO/VICODIN) 5-325 MG tablet Take 1 tablet by mouth every 6 (six) hours as needed for moderate pain. (Patient not taking: Reported on 11/20/2020) 100 tablet 0  . hydrocortisone (ANUSOL-HC) 2.5 % rectal cream Place 1 application rectally 2 (two) times daily. (Patient not taking: Reported on 11/20/2020) 30 g 0  . lidocaine-prilocaine (EMLA) cream Apply 1 application topically as needed. (Patient not taking: Reported on 11/20/2020) 30 g 1  . ondansetron (ZOFRAN) 8 MG tablet Take 1 tablet (8 mg total) by mouth every 8 (eight) hours as needed for nausea or vomiting. Start 72 hours after each IV chemotherapy treatment (Patient not taking: No sig reported) 30 tablet 1  . polyethylene glycol (MIRALAX / GLYCOLAX) 17 g packet Take 17 g by mouth daily. (Patient not taking: Reported on 11/20/2020)    . Polysaccharide Iron Complex (FERREX 150 PO) Take 1 tablet by mouth in the morning and at bedtime.  (Patient not taking: Reported on 11/20/2020)    . prochlorperazine (COMPAZINE) 10 MG tablet Take 1 tablet (10 mg total) by mouth every 6 (six) hours as needed for nausea. (Patient not taking: No sig reported) 60 tablet 1   No current facility-administered medications for this encounter.     REVIEW OF SYSTEMS: Review of systems the patient reports he is doing very well overall.  He is having improvement in his rectal pain since having chemotherapy, he is taking Vicodin less frequently than he previously had.  At times he only needs to take this once a day.  He denies any rectal bleeding or discharge.  He denies any lower urinary tract symptoms.  He reports using Colace to keep his bowels soft and moving regularly.  He has noticed some coldness in his fingertips from prior chemotherapy regimen and vibratory changes.  No other complaints are verbalized.     PHYSICAL EXAM:  Wt Readings from Last 3 Encounters:  11/20/20 184 lb 6 oz (83.6 kg)  11/18/20 184 lb 8 oz (83.7 kg)  11/04/20 185 lb 6.4  oz (84.1 kg)   Temp Readings from Last 3 Encounters:  11/20/20 (!) 96.8 F (36 C) (Temporal)  11/18/20 98 F (36.7 C) (Tympanic)  11/04/20 98.1 F (36.7 C) (Tympanic)   BP Readings from Last 3 Encounters:  11/20/20 (!) 147/76  11/18/20 (!) 152/61  11/04/20 (!) 152/75   Pulse Readings from Last 3 Encounters:  11/20/20 93  11/18/20 84  11/04/20 86   Pain Assessment Pain Score: 6  Pain Frequency: Constant Pain Loc: Rectum/10  In general this is a well appearing African American male in no acute distress. He's alert and oriented x4 and appropriate throughout the examination. Cardiopulmonary assessment is negative for acute distress and he exhibits normal effort.    ECOG = 1  0 - Asymptomatic (  Fully active, able to carry on all predisease activities without restriction)  1 - Symptomatic but completely ambulatory (Restricted in physically strenuous activity but ambulatory and able to carry out work of a light or sedentary nature. For example, light housework, office work)  2 - Symptomatic, <50% in bed during the day (Ambulatory and capable of all self care but unable to carry out any work activities. Up and about more than 50% of waking hours)  3 - Symptomatic, >50% in bed, but not bedbound (Capable of only limited self-care, confined to bed or chair 50% or more of waking hours)  4 - Bedbound (Completely disabled. Cannot carry on any self-care. Totally confined to bed or chair)  5 - Death   Eustace Pen MM, Creech RH, Tormey DC, et al. [phone]). "Toxicity and response criteria of the [facility]". [place] Oncol. 5 (6): 649-55    LABORATORY DATA:  Lab Results  Component Value Date   WBC 4.9 11/18/2020   HGB 10.8 (L) 11/18/2020   HCT 31.6 (L) 11/18/2020   MCV 83.4 11/18/2020   PLT 165 11/18/2020   Lab Results  Component Value Date   NA 139 11/18/2020   K 3.7 11/18/2020   CL 107 11/18/2020   CO2 24 11/18/2020   Lab Results  Component Value Date    ALT 33 11/18/2020   AST 34 11/18/2020   ALKPHOS 125 11/18/2020   BILITOT 0.5 11/18/2020      RADIOGRAPHY: CT CHEST W CONTRAST  Result Date: 10/31/2020 CLINICAL DATA:  Restaging colorectal carcinoma. EXAM: CT CHEST, ABDOMEN, AND PELVIS WITH CONTRAST TECHNIQUE: Multidetector CT imaging of the chest, abdomen and pelvis was performed following the standard protocol during bolus administration of intravenous contrast. CONTRAST:  151mL OMNIPAQUE IOHEXOL 300 MG/ML  SOLN COMPARISON:  CT chest 07/12/2020 and CT AP 06/27/2020 FINDINGS: CT CHEST FINDINGS Cardiovascular: Heart size is. No pericardial. Aortic atherosclerosis. Mediastinum/Nodes: No enlarged mediastinal, hilar, or axillary lymph nodes. Thyroid gland, trachea, and esophagus demonstrate no significant findings. Lungs/Pleura: No pleural effusion. No airspace consolidation, atelectasis, or pneumothorax. Mild scarring within the lung bases. Small scattered lung nodules are again noted. -Index nodule within the right lower lobe measures 2 mm, image 90/4. -Index nodule in the posterior left lower lobe measures 3 mm, image 87/4. -Left upper lobe lung nodule measures 5 mm, image 73/4. Other tiny lung nodules are also stable. Musculoskeletal: No chest wall mass or suspicious bone lesions identified. CT ABDOMEN PELVIS FINDINGS Hepatobiliary: No focal liver abnormality is seen. No gallstones, gallbladder wall thickening, or biliary dilatation. Pancreas: Unremarkable. No pancreatic ductal dilatation or surrounding inflammatory changes. Spleen: Normal in size without focal abnormality. Adrenals/Urinary Tract: Normal appearance of the adrenal glands. Bilateral kidney cysts are identified. The largest is in the right upper pole renal sinus measuring 2.9 cm, image 27/7. no hydronephrosis identified bilaterally. Urinary bladder is unremarkable. Stomach/Bowel: Stomach appears normal. No small bowel wall thickening, inflammation or distension. Moderate stool burden  identified throughout the colon. Mass involving the anterior and right lateral rectal wall is again identified. This measures approximately 5.8 by 4.6 by 4.8 cm, image 108/2. Previously 5.8 x 4.8 by 4.9 cm. Vascular/Lymphatic: Aortic atherosclerosis. No aneurysm. No upper abdominal adenopathy. No adenopathy identified. Reproductive: Large bilateral hydroceles are identified, left greater than right. Prostate gland enlargement. Other: No significant free fluid or fluid collections. Fat containing left inguinal hernia. Musculoskeletal: No acute or significant osseous findings. Degenerative disc disease noted L5-S1. No acute or suspicious osseous findings. IMPRESSION:  1. No significant interval change in the appearance of the large rectal mass. No signs of nodal metastasis or solid organ metastasis to the upper abdomen. 2. Small, scattered, nonspecific lung nodules are unchanged from previous exam. 3. Large bilateral hydroceles, left greater than right. 4. Aortic atherosclerosis. 5. Bilateral kidney cysts. Aortic Atherosclerosis (ICD10-I70.0). Electronically Signed   By: Kerby Moors M.D.   On: 10/31/2020 09:11   CT ABDOMEN PELVIS W CONTRAST  Result Date: 10/31/2020 CLINICAL DATA:  Restaging colorectal carcinoma. EXAM: CT CHEST, ABDOMEN, AND PELVIS WITH CONTRAST TECHNIQUE: Multidetector CT imaging of the chest, abdomen and pelvis was performed following the standard protocol during bolus administration of intravenous contrast. CONTRAST:  174mL OMNIPAQUE IOHEXOL 300 MG/ML  SOLN COMPARISON:  CT chest 07/12/2020 and CT AP 06/27/2020 FINDINGS: CT CHEST FINDINGS Cardiovascular: Heart size is. No pericardial. Aortic atherosclerosis. Mediastinum/Nodes: No enlarged mediastinal, hilar, or axillary lymph nodes. Thyroid gland, trachea, and esophagus demonstrate no significant findings. Lungs/Pleura: No pleural effusion. No airspace consolidation, atelectasis, or pneumothorax. Mild scarring within the lung bases. Small  scattered lung nodules are again noted. -Index nodule within the right lower lobe measures 2 mm, image 90/4. -Index nodule in the posterior left lower lobe measures 3 mm, image 87/4. -Left upper lobe lung nodule measures 5 mm, image 73/4. Other tiny lung nodules are also stable. Musculoskeletal: No chest wall mass or suspicious bone lesions identified. CT ABDOMEN PELVIS FINDINGS Hepatobiliary: No focal liver abnormality is seen. No gallstones, gallbladder wall thickening, or biliary dilatation. Pancreas: Unremarkable. No pancreatic ductal dilatation or surrounding inflammatory changes. Spleen: Normal in size without focal abnormality. Adrenals/Urinary Tract: Normal appearance of the adrenal glands. Bilateral kidney cysts are identified. The largest is in the right upper pole renal sinus measuring 2.9 cm, image 27/7. no hydronephrosis identified bilaterally. Urinary bladder is unremarkable. Stomach/Bowel: Stomach appears normal. No small bowel wall thickening, inflammation or distension. Moderate stool burden identified throughout the colon. Mass involving the anterior and right lateral rectal wall is again identified. This measures approximately 5.8 by 4.6 by 4.8 cm, image 108/2. Previously 5.8 x 4.8 by 4.9 cm. Vascular/Lymphatic: Aortic atherosclerosis. No aneurysm. No upper abdominal adenopathy. No adenopathy identified. Reproductive: Large bilateral hydroceles are identified, left greater than right. Prostate gland enlargement. Other: No significant free fluid or fluid collections. Fat containing left inguinal hernia. Musculoskeletal: No acute or significant osseous findings. Degenerative disc disease noted L5-S1. No acute or suspicious osseous findings. IMPRESSION: 1. No significant interval change in the appearance of the large rectal mass. No signs of nodal metastasis or solid organ metastasis to the upper abdomen. 2. Small, scattered, nonspecific lung nodules are unchanged from previous exam. 3. Large  bilateral hydroceles, left greater than right. 4. Aortic atherosclerosis. 5. Bilateral kidney cysts. Aortic Atherosclerosis (ICD10-I70.0). Electronically Signed   By: Kerby Moors M.D.   On: 10/31/2020 09:11       IMPRESSION/PLAN: 1. At least Stage IIB, cT4aN0M0, adenocarcinoma of the rectum.  Dr. Lisbeth Renshaw reviews the findings from his most recent restaging films from December 2021 which show persistent but not progressive disease in the pelvis.  No evidence of concern for progression of pulmonary nodules were identified either and he is still felt to have stage II disease.  He has met with Dr. Marcello Moores since our last discussion, I will inform her of his progress, and our plans to proceed with chemoradiation.  We discussed the risks, benefits, short and long-term effects of radiotherapy and the patient is interested in moving forward.  He  understands that he would see Dr. Marcello Moores in approximately 1 month following radiation to anticipate further surgical discussion.  We expect that he will simulate today, and begin treatment on Monday, 11/25/2020. Written consent is obtained and placed in the chart, a copy was provided to the patient.   In a visit lasting 60 minutes, greater than 50% of the time was spent face to face with Dr. Lisbeth Renshaw and myself via WebEx remotely discussing the patient's condition, in preparation for the discussion, and coordinating the patient's care.   The above documentation reflects my direct findings during this shared patient visit. Please see the separate note by Dr. Lisbeth Renshaw on this date for the remainder of the patient's plan of care.    Carola Rhine, PAC

## 2020-11-20 NOTE — Progress Notes (Signed)
Weight and vitals stable. Reports rectal pain is a constant 6 on a scale of 0-10. However, he explains that his rectal pain has greatly improved overall. Patient explains that frequent bowel movements make his rectal pain worse. Reports rarely requiring vicodin to manage rectal pain. Reports using colace to keep bowels soft and denies need for Miralax at this time. Denies nausea, vomiting, diarrhea or constipation. Denies seeing blood in his stool. Reports nocturia every 2-3 hours. Denies dysuria or other LUTS.   BP (!) 147/76 (BP Location: Left Arm, Patient Position: Sitting)   Pulse 93   Temp (!) 96.8 F (36 C) (Temporal)   Resp 18   Ht 5\' 10"  (1.778 m)   Wt 184 lb 6 oz (83.6 kg)   SpO2 100%   BMI 26.46 kg/m  Wt Readings from Last 3 Encounters:  11/20/20 184 lb 6 oz (83.6 kg)  11/18/20 184 lb 8 oz (83.7 kg)  11/04/20 185 lb 6.4 oz (84.1 kg)

## 2020-11-21 ENCOUNTER — Ambulatory Visit: Payer: 59 | Admitting: Radiation Oncology

## 2020-11-21 ENCOUNTER — Ambulatory Visit: Payer: 59

## 2020-11-22 ENCOUNTER — Telehealth: Payer: Self-pay | Admitting: *Deleted

## 2020-11-22 NOTE — Telephone Encounter (Signed)
Received fax from [facility] [phone] reporting they have not been able to reach patient to obtain his insurance information to process capecitabine script and arrange shipment.  Left message for patient to call office to discuss.

## 2020-11-24 DIAGNOSIS — Z510 Encounter for antineoplastic radiation therapy: Secondary | ICD-10-CM | POA: Diagnosis not present

## 2020-11-27 ENCOUNTER — Ambulatory Visit
Admission: RE | Admit: 2020-11-27 | Discharge: 2020-11-27 | Disposition: A | Discharge status: Home / Self Care | Payer: Medicare Other | Source: Ambulatory Visit | Attending: Radiation Oncology | Admitting: Radiation Oncology

## 2020-11-27 ENCOUNTER — Other Ambulatory Visit: Payer: Self-pay

## 2020-11-27 DIAGNOSIS — Z510 Encounter for antineoplastic radiation therapy: Secondary | ICD-10-CM | POA: Diagnosis not present

## 2020-11-28 ENCOUNTER — Ambulatory Visit
Admission: RE | Admit: 2020-11-28 | Discharge: 2020-11-28 | Disposition: A | Discharge status: Home / Self Care | Payer: Medicare Other | Source: Ambulatory Visit | Attending: Radiation Oncology | Admitting: Radiation Oncology

## 2020-11-28 DIAGNOSIS — Z510 Encounter for antineoplastic radiation therapy: Secondary | ICD-10-CM | POA: Diagnosis not present

## 2020-11-28 NOTE — Progress Notes (Signed)
Pt here for patient teaching.  Pt given Radiation and You booklet and skin care instructions.  Reviewed areas of pertinence such as diarrhea, fatigue, hair loss, sexual and fertility changes, skin changes and urinary and bladder changes . Pt able to give teach back of to pat skin, use unscented/gentle soap, use baby wipes, have Imodium on hand, drink plenty of water and sitz bath,avoid applying anything to skin within 4 hours of treatment. Pt verbalizes understanding of information given and will contact nursing with any questions or concerns.     Shane [name] M. Ziva Nunziata RN, BSN           

## 2020-11-29 ENCOUNTER — Other Ambulatory Visit: Payer: Self-pay

## 2020-11-29 ENCOUNTER — Ambulatory Visit
Admission: RE | Admit: 2020-11-29 | Discharge: 2020-11-29 | Disposition: A | Discharge status: Home / Self Care | Payer: Medicare Other | Source: Ambulatory Visit | Attending: Radiation Oncology | Admitting: Radiation Oncology

## 2020-11-29 DIAGNOSIS — Z510 Encounter for antineoplastic radiation therapy: Secondary | ICD-10-CM | POA: Diagnosis not present

## 2020-12-02 ENCOUNTER — Ambulatory Visit
Admission: RE | Admit: 2020-12-02 | Discharge: 2020-12-02 | Disposition: A | Discharge status: Home / Self Care | Payer: Medicare Other | Source: Ambulatory Visit | Attending: Radiation Oncology | Admitting: Radiation Oncology

## 2020-12-02 ENCOUNTER — Other Ambulatory Visit: Payer: Self-pay

## 2020-12-02 DIAGNOSIS — Z510 Encounter for antineoplastic radiation therapy: Secondary | ICD-10-CM | POA: Diagnosis not present

## 2020-12-03 ENCOUNTER — Telehealth: Payer: Self-pay

## 2020-12-03 ENCOUNTER — Telehealth: Payer: Self-pay | Admitting: *Deleted

## 2020-12-03 ENCOUNTER — Ambulatory Visit
Admission: RE | Admit: 2020-12-03 | Discharge: 2020-12-03 | Disposition: A | Discharge status: Home / Self Care | Payer: Medicare Other | Source: Ambulatory Visit | Attending: Radiation Oncology | Admitting: Radiation Oncology

## 2020-12-03 DIAGNOSIS — Z510 Encounter for antineoplastic radiation therapy: Secondary | ICD-10-CM | POA: Diagnosis not present

## 2020-12-03 NOTE — Telephone Encounter (Signed)
Left VM for patient to please reach out to [facility] at [phone] if he has not already spoken with them regarding his capecitabine. Please call nurse back if he has not received the medication.

## 2020-12-03 NOTE — Telephone Encounter (Signed)
Patient called back to report as of January 2022, he only has Medicare A & B with no prescription coverage. Was dispensed #98 capecitabine tablets with first fill.  Sent message to E. Wynonia Lawman to determine if he will qualify for PCP.

## 2020-12-03 NOTE — Telephone Encounter (Signed)
Oral Oncology Patient Advocate Encounter  Prior Authorization for Xeloda has been approved.    PA# [id number] Effective dates: 12/03/20 through 12/03/21  Patients co-pay is $2450  Oral Oncology Clinic will continue to follow.    [name] Patient [place] Phone [phone] Fax [phone] 12/03/2020 3:04 PM

## 2020-12-03 NOTE — Telephone Encounter (Signed)
Oral Oncology Patient Advocate Encounter   Was successful in securing patient an $62 grant from [facility] [place] [facility]) to provide copayment coverage for Xeloda.  This will keep the out of pocket expense at $0.     I have spoken with the patient.    The billing information is as follows and has been shared with [place].   Member ID: [id number] Group ID: [id number] RxBin: [id number] Dates of Eligibility: 09/04/20 through 12/02/21  Fund:  [place] Patient [place] Phone [phone] Fax [phone] 12/03/2020 3:01 PM

## 2020-12-03 NOTE — Telephone Encounter (Signed)
Oral Oncology Patient Advocate Encounter  Received notification from Shane [name] that prior authorization for Xeloda is required.  PA submitted b phone [phone] Key [id number] Status is pending  Oral Oncology Clinic will continue to follow.  [place] Patient [place] Phone [phone] Fax [phone] 12/03/2020 3:03 PM

## 2020-12-04 ENCOUNTER — Other Ambulatory Visit: Payer: Self-pay

## 2020-12-04 ENCOUNTER — Ambulatory Visit
Admission: RE | Admit: 2020-12-04 | Discharge: 2020-12-04 | Disposition: A | Discharge status: Home / Self Care | Payer: Medicare Other | Source: Ambulatory Visit | Attending: Radiation Oncology | Admitting: Radiation Oncology

## 2020-12-04 DIAGNOSIS — Z510 Encounter for antineoplastic radiation therapy: Secondary | ICD-10-CM | POA: Diagnosis not present

## 2020-12-04 MED FILL — CAPECITABINE 500 MG TABLET: 500 | 14 days supply | Qty: 98 | Fill #0

## 2020-12-05 ENCOUNTER — Other Ambulatory Visit: Payer: Self-pay

## 2020-12-05 ENCOUNTER — Telehealth: Payer: Self-pay

## 2020-12-05 ENCOUNTER — Ambulatory Visit
Admission: RE | Admit: 2020-12-05 | Discharge: 2020-12-05 | Disposition: A | Discharge status: Home / Self Care | Payer: Medicare Other | Source: Ambulatory Visit | Attending: Radiation Oncology | Admitting: Radiation Oncology

## 2020-12-05 DIAGNOSIS — Z510 Encounter for antineoplastic radiation therapy: Secondary | ICD-10-CM | POA: Diagnosis not present

## 2020-12-05 NOTE — Telephone Encounter (Signed)
Returned call to pt about getting his updated insurance information on file. Instructed pt to bring his new information with him when he comes for next appointment on 12/06/20. Pt verbalized understanding.

## 2020-12-06 ENCOUNTER — Ambulatory Visit
Admission: RE | Admit: 2020-12-06 | Discharge: 2020-12-06 | Disposition: A | Discharge status: Home / Self Care | Payer: Medicare Other | Source: Ambulatory Visit | Attending: Radiation Oncology | Admitting: Radiation Oncology

## 2020-12-06 ENCOUNTER — Other Ambulatory Visit: Payer: Self-pay

## 2020-12-06 DIAGNOSIS — Z510 Encounter for antineoplastic radiation therapy: Secondary | ICD-10-CM | POA: Diagnosis not present

## 2020-12-09 ENCOUNTER — Telehealth: Payer: Self-pay | Admitting: *Deleted

## 2020-12-09 ENCOUNTER — Encounter: Payer: Self-pay | Admitting: Oncology

## 2020-12-09 ENCOUNTER — Other Ambulatory Visit: Payer: Self-pay

## 2020-12-09 ENCOUNTER — Ambulatory Visit
Admission: RE | Admit: 2020-12-09 | Discharge: 2020-12-09 | Disposition: A | Discharge status: Home / Self Care | Payer: Medicare Other | Source: Ambulatory Visit | Attending: Radiation Oncology | Admitting: Radiation Oncology

## 2020-12-09 DIAGNOSIS — Z510 Encounter for antineoplastic radiation therapy: Secondary | ICD-10-CM | POA: Diagnosis not present

## 2020-12-09 NOTE — Telephone Encounter (Signed)
Called pharmacy to confirm the hydrocodone script is still valid and he still had a refill on the Naprosyn. Both will be ready for pick up in 2 hours. Called and left VM for Shane [name] with above information.

## 2020-12-10 ENCOUNTER — Inpatient Hospital Stay: Payer: Medicare Other | Attending: Physician Assistant

## 2020-12-10 ENCOUNTER — Other Ambulatory Visit: Payer: Self-pay

## 2020-12-10 ENCOUNTER — Ambulatory Visit
Admission: RE | Admit: 2020-12-10 | Discharge: 2020-12-10 | Disposition: A | Discharge status: Home / Self Care | Payer: Medicare Other | Source: Ambulatory Visit | Attending: Radiation Oncology | Admitting: Radiation Oncology

## 2020-12-10 ENCOUNTER — Encounter: Payer: Self-pay | Admitting: Nurse Practitioner

## 2020-12-10 ENCOUNTER — Inpatient Hospital Stay (HOSPITAL_BASED_OUTPATIENT_CLINIC_OR_DEPARTMENT_OTHER): Payer: Medicare Other | Admitting: Nurse Practitioner

## 2020-12-10 ENCOUNTER — Inpatient Hospital Stay: Payer: Medicare Other

## 2020-12-10 VITALS — BP 134/54 | HR 76 | Temp 97.6°F | Resp 18 | Ht 70.0 in | Wt 190.7 lb

## 2020-12-10 DIAGNOSIS — R197 Diarrhea, unspecified: Secondary | ICD-10-CM | POA: Insufficient documentation

## 2020-12-10 DIAGNOSIS — R918 Other nonspecific abnormal finding of lung field: Secondary | ICD-10-CM | POA: Insufficient documentation

## 2020-12-10 DIAGNOSIS — C20 Malignant neoplasm of rectum: Secondary | ICD-10-CM | POA: Insufficient documentation

## 2020-12-10 DIAGNOSIS — Z510 Encounter for antineoplastic radiation therapy: Secondary | ICD-10-CM | POA: Diagnosis not present

## 2020-12-10 DIAGNOSIS — Z95828 Presence of other vascular implants and grafts: Secondary | ICD-10-CM | POA: Insufficient documentation

## 2020-12-10 LAB — CMP (CANCER CENTER ONLY)
ALT: 21 U/L (ref 0–44)
AST: 24 U/L (ref 15–41)
Albumin: 3.4 g/dL — ABNORMAL LOW (ref 3.5–5.0)
Alkaline Phosphatase: 93 U/L (ref 38–126)
Anion gap: 5 (ref 5–15)
BUN: 16 mg/dL (ref 8–23)
CO2: 25 mmol/L (ref 22–32)
Calcium: 9.2 mg/dL (ref 8.9–10.3)
Chloride: 107 mmol/L (ref 98–111)
Creatinine: 0.79 mg/dL (ref 0.61–1.24)
GFR, Estimated: >60 mL/min (ref >60)
Glucose, Bld: 88 mg/dL (ref 70–99)
Potassium: 4 mmol/L (ref 3.5–5.1)
Sodium: 137 mmol/L (ref 135–145)
Total Bilirubin: 0.5 mg/dL (ref 0.3–1.2)
Total Protein: 6.8 g/dL (ref 6.5–8.1)

## 2020-12-10 LAB — CBC WITH DIFFERENTIAL (CANCER CENTER ONLY)
Abs Immature Granulocytes: 0.02 10*3/uL (ref 0.00–0.07)
Basophils Absolute: 0.1 10*3/uL (ref 0.0–0.1)
Basophils Relative: 2 %
Eosinophils Absolute: 0.2 10*3/uL (ref 0.0–0.5)
Eosinophils Relative: 5 %
HCT: 30 % — ABNORMAL LOW (ref 39.0–52.0)
Hemoglobin: 10.3 g/dL — ABNORMAL LOW (ref 13.0–17.0)
Immature Granulocytes: 1 %
Lymphocytes Relative: 14 %
Lymphs Abs: 0.6 10*3/uL — ABNORMAL LOW (ref 0.7–4.0)
MCH: 29.7 pg (ref 26.0–34.0)
MCHC: 34.3 g/dL (ref 30.0–36.0)
MCV: 86.5 fL (ref 80.0–100.0)
Monocytes Absolute: 0.5 10*3/uL (ref 0.1–1.0)
Monocytes Relative: 12 %
Neutro Abs: 2.7 10*3/uL (ref 1.7–7.7)
Neutrophils Relative %: 66 %
Platelet Count: 140 10*3/uL — ABNORMAL LOW (ref 150–400)
RBC: 3.47 MIL/uL — ABNORMAL LOW (ref 4.22–5.81)
RDW: 18.8 % — ABNORMAL HIGH (ref 11.5–15.5)
WBC Count: 4 10*3/uL (ref 4.0–10.5)
nRBC: 0 % (ref 0.0–0.2)

## 2020-12-10 MED ORDER — SODIUM CHLORIDE 0.9% FLUSH
10.0000 mL | Freq: Once | INTRAVENOUS | Status: AC
  Administered 2020-12-10: 10 mL
  Filled 2020-12-10: qty 10

## 2020-12-10 MED ORDER — HEPARIN SOD (PORK) LOCK FLUSH 100 UNIT/ML IV SOLN
500.0000 [IU] | Freq: Once | INTRAVENOUS | Status: AC
  Administered 2020-12-10: 500 [IU]
  Filled 2020-12-10: qty 5

## 2020-12-10 NOTE — Progress Notes (Signed)
  [place] OFFICE PROGRESS NOTE   Diagnosis: Rectal cancer  INTERVAL HISTORY:   Shane [name] returns as scheduled.  He began radiation/Xeloda 11/27/2020.  He denies nausea/vomiting.  No mouth sores.  No diarrhea.  He does note frequent small bowel movements.  He occasionally takes Imodium to decrease the frequency.  He denies bleeding.  No pain.  No neuropathy symptoms.  Objective:  Vital signs in last 24 hours:  Blood pressure (!) 134/54, pulse 76, temperature 97.6 F (36.4 C), temperature source Tympanic, resp. rate 18, height 5\' 10"  (1.778 m), weight 190 lb 11.2 oz (86.5 kg), SpO2 100 %.    HEENT: No thrush or ulcers. Resp: Lungs clear bilaterally. Cardio: Regular rate and rhythm. GI: Abdomen soft and nontender.  No hepatomegaly. Vascular: No leg edema.  Skin: Palms with hyperpigmentation. Port-A-Cath without erythema.   Lab Results:  Lab Results  Component Value Date   WBC 4.9 11/18/2020   HGB 10.8 (L) 11/18/2020   HCT 31.6 (L) 11/18/2020   MCV 83.4 11/18/2020   PLT 165 11/18/2020   NEUTROABS 2.2 11/18/2020    Imaging:  No results found.  Medications: I have reviewed the patient's current medications.  Assessment/Plan: 1. Rectal cancer  CT abdomen/pelvis 06/27/2020-irregular masslike thickening of the rectum, increased colonic stool, perirectal infiltration into the presacral space, tiny bibasilar pulmonary nodules  CT chest 07/12/2020-multiple small pulmonary nodules, largest 3 mm in the left apex  MRI pelvis 07/12/2020-tumor at 11.4 centimeters from the anal verge, 6.6 cm from the internal sphincter, tumor extends through the right lateral rectal wall and directly involves an adjacent loop of distal sigmoid colon with invasion of anterior peritoneal reflection and involvement of the right seminal vesicle, no lymphadenopathy. T4N0  Colonoscopy 07/17/2020-mass at 12 cm from the anal verge, completely obstructing mass at 23 cm from the anal verge,  large internal hemorrhoids, biopsy confirmed adenocarcinoma  Cycle 1 FOLFOX 07/30/2020  Cycle 2 FOLFOX 08/13/2020  Cycle 3 FOLFOX 08/27/2020  Cycle 4 FOLFOX 09/09/2020  Cycle 5 FOLFOX 09/24/2020, 5-FU bolus held, oxaliplatin dose reduced secondary to neutropenia  Cycle 6 FOLFOX 10/07/2020, 5-FU bolus and full dose oxaliplatin, Neulasta  Cycle 7 FOLFOX 10/21/2020, Neulasta  CTs 10/31/2020-no change in rectal mass, no evidence of nodal metastases, unchanged nonspecific lung nodules  Cycle 8 FOLFOX 11/04/2020  Radiation/Xeloda  11/27/2020  2. Iron deficiency anemia secondary to #1 3. Hemorrhoids 4. Rectal pain secondary to hemorrhoids versus the primary rectal tumor 5. Constipation secondary to #1, improved with a stool softener and laxative regimen 6. Small indeterminate pulmonary nodules 7. Arthritis 8. 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  He is completing the course of neoadjuvant radiation/Xeloda.  He seems to be tolerating well.  Plan to continue the same.  We reviewed the CBC from today.  Counts adequate to continue with Xeloda.  He will return for lab and follow-up in 2 weeks.    Ned Card ANP/GNP-BC   12/10/2020  9:41 AM

## 2020-12-11 ENCOUNTER — Ambulatory Visit
Admission: RE | Admit: 2020-12-11 | Discharge: 2020-12-11 | Disposition: A | Discharge status: Home / Self Care | Payer: Medicare Other | Source: Ambulatory Visit | Attending: Radiation Oncology | Admitting: Radiation Oncology

## 2020-12-11 ENCOUNTER — Telehealth: Payer: Self-pay | Admitting: Nurse Practitioner

## 2020-12-11 ENCOUNTER — Other Ambulatory Visit: Payer: Self-pay

## 2020-12-11 DIAGNOSIS — Z510 Encounter for antineoplastic radiation therapy: Secondary | ICD-10-CM | POA: Diagnosis not present

## 2020-12-11 NOTE — Telephone Encounter (Signed)
Scheduled appointments per 2/1 los. Spoke to patient who is aware of appointments date and times.  

## 2020-12-12 ENCOUNTER — Ambulatory Visit
Admission: RE | Admit: 2020-12-12 | Discharge: 2020-12-12 | Disposition: A | Discharge status: Home / Self Care | Payer: Medicare Other | Source: Ambulatory Visit | Attending: Radiation Oncology | Admitting: Radiation Oncology

## 2020-12-12 ENCOUNTER — Other Ambulatory Visit: Payer: Self-pay

## 2020-12-12 DIAGNOSIS — Z510 Encounter for antineoplastic radiation therapy: Secondary | ICD-10-CM | POA: Diagnosis not present

## 2020-12-13 ENCOUNTER — Ambulatory Visit
Admission: RE | Admit: 2020-12-13 | Discharge: 2020-12-13 | Disposition: A | Discharge status: Home / Self Care | Payer: Medicare Other | Source: Ambulatory Visit | Attending: Radiation Oncology | Admitting: Radiation Oncology

## 2020-12-13 DIAGNOSIS — Z510 Encounter for antineoplastic radiation therapy: Secondary | ICD-10-CM | POA: Diagnosis not present

## 2020-12-16 ENCOUNTER — Ambulatory Visit
Admission: RE | Admit: 2020-12-16 | Discharge: 2020-12-16 | Disposition: A | Discharge status: Home / Self Care | Payer: Medicare Other | Source: Ambulatory Visit | Attending: Radiation Oncology | Admitting: Radiation Oncology

## 2020-12-16 ENCOUNTER — Other Ambulatory Visit: Payer: Self-pay

## 2020-12-16 DIAGNOSIS — Z510 Encounter for antineoplastic radiation therapy: Secondary | ICD-10-CM | POA: Diagnosis not present

## 2020-12-17 ENCOUNTER — Other Ambulatory Visit: Payer: Self-pay

## 2020-12-17 ENCOUNTER — Ambulatory Visit
Admission: RE | Admit: 2020-12-17 | Discharge: 2020-12-17 | Disposition: A | Discharge status: Home / Self Care | Payer: Medicare Other | Source: Ambulatory Visit | Attending: Radiation Oncology | Admitting: Radiation Oncology

## 2020-12-17 DIAGNOSIS — Z510 Encounter for antineoplastic radiation therapy: Secondary | ICD-10-CM | POA: Diagnosis not present

## 2020-12-18 ENCOUNTER — Encounter: Payer: Self-pay | Admitting: Radiation Oncology

## 2020-12-18 ENCOUNTER — Ambulatory Visit
Admission: RE | Admit: 2020-12-18 | Discharge: 2020-12-18 | Disposition: A | Discharge status: Home / Self Care | Payer: Medicare Other | Source: Ambulatory Visit | Attending: Radiation Oncology | Admitting: Radiation Oncology

## 2020-12-18 DIAGNOSIS — Z510 Encounter for antineoplastic radiation therapy: Secondary | ICD-10-CM | POA: Diagnosis not present

## 2020-12-19 ENCOUNTER — Ambulatory Visit
Admission: RE | Admit: 2020-12-19 | Discharge: 2020-12-19 | Disposition: A | Discharge status: Home / Self Care | Payer: Medicare Other | Source: Ambulatory Visit | Attending: Radiation Oncology | Admitting: Radiation Oncology

## 2020-12-19 ENCOUNTER — Other Ambulatory Visit: Payer: Self-pay

## 2020-12-19 DIAGNOSIS — Z510 Encounter for antineoplastic radiation therapy: Secondary | ICD-10-CM | POA: Diagnosis not present

## 2020-12-20 ENCOUNTER — Ambulatory Visit
Admission: RE | Admit: 2020-12-20 | Discharge: 2020-12-20 | Disposition: A | Discharge status: Home / Self Care | Payer: Medicare Other | Source: Ambulatory Visit | Attending: Radiation Oncology | Admitting: Radiation Oncology

## 2020-12-20 ENCOUNTER — Other Ambulatory Visit: Payer: Self-pay

## 2020-12-20 DIAGNOSIS — Z510 Encounter for antineoplastic radiation therapy: Secondary | ICD-10-CM | POA: Diagnosis not present

## 2020-12-23 ENCOUNTER — Other Ambulatory Visit: Payer: Self-pay

## 2020-12-23 ENCOUNTER — Ambulatory Visit
Admission: RE | Admit: 2020-12-23 | Discharge: 2020-12-23 | Disposition: A | Discharge status: Home / Self Care | Payer: Medicare Other | Source: Ambulatory Visit | Attending: Radiation Oncology | Admitting: Radiation Oncology

## 2020-12-23 DIAGNOSIS — Z510 Encounter for antineoplastic radiation therapy: Secondary | ICD-10-CM | POA: Diagnosis not present

## 2020-12-23 NOTE — Progress Notes (Signed)
  Radiation Oncology         [phone]) [phone] ________________________________  Name: Shane [name] MRN: [id number]  Date: 12/18/2020  DOB: July 02, 1951  SIMULATION NOTE   NARRATIVE:  The patient underwent simulation today for ongoing radiation therapy.  The existing CT study set was employed for the purpose of virtual treatment planning.  The target and avoidance structures were reviewed and modified as necessary.  Treatment planning then occurred.  The radiation boost prescription was entered and confirmed.  A total of 4 complex treatment devices were fabricated in the form of multi-leaf collimators to shape radiation around the targets while maximally excluding nearby normal structures. I have requested : Isodose Plan.    PLAN:  This modified radiation beam arrangement is intended to continue the current radiation dose to an additional 5.4 Gy in 3 fractions for a total cumulative dose of 50.4 Gy.    ------------------------------------------------  Jodelle Gross, MD, PhD

## 2020-12-24 ENCOUNTER — Ambulatory Visit
Admission: RE | Admit: 2020-12-24 | Discharge: 2020-12-24 | Disposition: A | Discharge status: Home / Self Care | Payer: Medicare Other | Source: Ambulatory Visit | Attending: Radiation Oncology | Admitting: Radiation Oncology

## 2020-12-24 ENCOUNTER — Other Ambulatory Visit: Payer: Self-pay

## 2020-12-24 DIAGNOSIS — Z510 Encounter for antineoplastic radiation therapy: Secondary | ICD-10-CM | POA: Diagnosis not present

## 2020-12-25 ENCOUNTER — Other Ambulatory Visit: Payer: Self-pay

## 2020-12-25 ENCOUNTER — Telehealth: Payer: Self-pay | Admitting: Oncology

## 2020-12-25 ENCOUNTER — Inpatient Hospital Stay (HOSPITAL_BASED_OUTPATIENT_CLINIC_OR_DEPARTMENT_OTHER): Payer: Medicare Other | Admitting: Oncology

## 2020-12-25 ENCOUNTER — Ambulatory Visit
Admission: RE | Admit: 2020-12-25 | Discharge: 2020-12-25 | Disposition: A | Discharge status: Home / Self Care | Payer: Medicare Other | Source: Ambulatory Visit | Attending: Radiation Oncology | Admitting: Radiation Oncology

## 2020-12-25 ENCOUNTER — Inpatient Hospital Stay: Payer: Medicare Other

## 2020-12-25 ENCOUNTER — Other Ambulatory Visit: Payer: Self-pay | Admitting: *Deleted

## 2020-12-25 VITALS — BP 133/66 | HR 85 | Temp 97.8°F | Resp 20 | Ht 70.0 in | Wt 191.9 lb

## 2020-12-25 DIAGNOSIS — C20 Malignant neoplasm of rectum: Secondary | ICD-10-CM

## 2020-12-25 DIAGNOSIS — Z510 Encounter for antineoplastic radiation therapy: Secondary | ICD-10-CM | POA: Diagnosis not present

## 2020-12-25 LAB — CMP (CANCER CENTER ONLY)
ALT: 15 U/L (ref 0–44)
AST: 21 U/L (ref 15–41)
Albumin: 3.2 g/dL — ABNORMAL LOW (ref 3.5–5.0)
Alkaline Phosphatase: 100 U/L (ref 38–126)
Anion gap: 6 (ref 5–15)
BUN: 14 mg/dL (ref 8–23)
CO2: 24 mmol/L (ref 22–32)
Calcium: 8.9 mg/dL (ref 8.9–10.3)
Chloride: 108 mmol/L (ref 98–111)
Creatinine: 0.79 mg/dL (ref 0.61–1.24)
GFR, Estimated: >60 mL/min (ref >60)
Glucose, Bld: 96 mg/dL (ref 70–99)
Potassium: 3.9 mmol/L (ref 3.5–5.1)
Sodium: 138 mmol/L (ref 135–145)
Total Bilirubin: 0.5 mg/dL (ref 0.3–1.2)
Total Protein: 6.8 g/dL (ref 6.5–8.1)

## 2020-12-25 LAB — CBC WITH DIFFERENTIAL (CANCER CENTER ONLY)
Abs Immature Granulocytes: 0.04 10*3/uL (ref 0.00–0.07)
Basophils Absolute: 0.1 10*3/uL (ref 0.0–0.1)
Basophils Relative: 2 %
Eosinophils Absolute: 0.4 10*3/uL (ref 0.0–0.5)
Eosinophils Relative: 8 %
HCT: 28.6 % — ABNORMAL LOW (ref 39.0–52.0)
Hemoglobin: 10.2 g/dL — ABNORMAL LOW (ref 13.0–17.0)
Immature Granulocytes: 1 %
Lymphocytes Relative: 6 %
Lymphs Abs: 0.3 10*3/uL — ABNORMAL LOW (ref 0.7–4.0)
MCH: 31.7 pg (ref 26.0–34.0)
MCHC: 35.7 g/dL (ref 30.0–36.0)
MCV: 88.8 fL (ref 80.0–100.0)
Monocytes Absolute: 0.6 10*3/uL (ref 0.1–1.0)
Monocytes Relative: 14 %
Neutro Abs: 3.2 10*3/uL (ref 1.7–7.7)
Neutrophils Relative %: 69 %
Platelet Count: 184 10*3/uL (ref 150–400)
RBC: 3.22 MIL/uL — ABNORMAL LOW (ref 4.22–5.81)
RDW: 20.2 % — ABNORMAL HIGH (ref 11.5–15.5)
WBC Count: 4.6 10*3/uL (ref 4.0–10.5)
nRBC: 0 % (ref 0.0–0.2)

## 2020-12-25 LAB — CEA (IN HOUSE-CHCC): CEA (CHCC-In House): 10.8 ng/mL — ABNORMAL HIGH (ref 0.00–5.00)

## 2020-12-25 MED ORDER — DIPHENOXYLATE-ATROPINE 2.5-0.025 MG PO TABS: 1.0000 | ORAL_TABLET | Freq: Three times a day (TID) | ORAL | 0 refills | Status: DC | PRN

## 2020-12-25 NOTE — Progress Notes (Signed)
[place] OFFICE PROGRESS NOTE   Diagnosis: Rectal cancer  INTERVAL HISTORY:   Shane [name] continues Xeloda and radiation.  No mouth sores, nausea, or hand/foot pain.  He reports frequent loose bowel movements beginning approximately 1 week after the start of Xeloda radiation.  He has small-volume stool every 2 hours.  He is taking Imodium twice daily.  He notes partial improvement in the stool frequency on the weekends.  He is drinking adequate fluids.  He continues to have rectal pain.  No skin breakdown at the perineum.  Objective:  Vital signs in last 24 hours:  Blood pressure 133/66, pulse 85, temperature 97.8 F (36.6 C), temperature source Tympanic, resp. rate 20, height 5\' 10"  (1.778 m), weight 191 lb 14.4 oz (87 kg), SpO2 99 %.    HEENT: No thrush or ulcers Resp: End inspiratory coarse rhonchi at the left upper posterior chest, no respiratory distress Cardio: Regular rate and rhythm GI: Nontender, no mass, no hepatosplenomegaly Vascular: No leg edema  Skin: Dryness and skin thickening of the hands, dryness with callus formation at the soles.  No erythema.  Portacath/PICC-without erythema  Lab Results:  Lab Results  Component Value Date   WBC 4.6 12/25/2020   HGB 10.2 (L) 12/25/2020   HCT 28.6 (L) 12/25/2020   MCV 88.8 12/25/2020   PLT 184 12/25/2020   NEUTROABS 3.2 12/25/2020    CMP  Lab Results  Component Value Date   NA 138 12/25/2020   K 3.9 12/25/2020   CL 108 12/25/2020   CO2 24 12/25/2020   GLUCOSE 96 12/25/2020   BUN 14 12/25/2020   CREATININE 0.79 12/25/2020   CALCIUM 8.9 12/25/2020   PROT 6.8 12/25/2020   ALBUMIN 3.2 (L) 12/25/2020   AST 21 12/25/2020   ALT 15 12/25/2020   ALKPHOS 100 12/25/2020   BILITOT 0.5 12/25/2020   GFRNONAA >60 12/25/2020   GFRAA >60 07/15/2020    Lab Results  Component Value Date   CEA1 24.92 (H) 11/18/2020     Medications: I have reviewed the patient's current  medications.   Assessment/Plan:  1. Rectal cancer  CT abdomen/pelvis 06/27/2020-irregular masslike thickening of the rectum, increased colonic stool, perirectal infiltration into the presacral space, tiny bibasilar pulmonary nodules  CT chest 07/12/2020-multiple small pulmonary nodules, largest 3 mm in the left apex  MRI pelvis 07/12/2020-tumor at 11.4 centimeters from the anal verge, 6.6 cm from the internal sphincter, tumor extends through the right lateral rectal wall and directly involves an adjacent loop of distal sigmoid colon with invasion of anterior peritoneal reflection and involvement of the right seminal vesicle, no lymphadenopathy. T4N0  Colonoscopy 07/17/2020-mass at 12 cm from the anal verge, completely obstructing mass at 23 cm from the anal verge, large internal hemorrhoids, biopsy confirmed adenocarcinoma  Cycle 1 FOLFOX 07/30/2020  Cycle 2 FOLFOX 08/13/2020  Cycle 3 FOLFOX 08/27/2020  Cycle 4 FOLFOX 09/09/2020  Cycle 5 FOLFOX 09/24/2020, 5-FU bolus held, oxaliplatin dose reduced secondary to neutropenia  Cycle 6 FOLFOX 10/07/2020, 5-FU bolus and full dose oxaliplatin, Neulasta  Cycle 7 FOLFOX 10/21/2020, Neulasta  CTs 10/31/2020-no change in rectal mass, no evidence of nodal metastases, unchanged nonspecific lung nodules  Cycle 8 FOLFOX 11/04/2020  Radiation/Xeloda  11/27/2020  2. Iron deficiency anemia secondary to #1 3. Hemorrhoids 4. Rectal pain secondary to hemorrhoids versus the primary rectal tumor 5. Constipation secondary to #1, improved with a stool softener and laxative regimen 6. Small indeterminate pulmonary nodules 7. Arthritis 8. 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 Hoge is completing neoadjuvant capecitabine and radiation.  He appears to be tolerating the treatment well other than frequent small-volume  diarrhea.  I encouraged him to increase the use of Imodium.  We will prescribe Lomotil to use if the Imodium is not effective.  He will complete radiation 01/03/2021.  He will return for an office and lab visit on 01/10/2021.  He is scheduled to see Dr. Marcello Moores for surgical planning.  Betsy Coder, MD  12/25/2020  11:15 AM

## 2020-12-25 NOTE — Progress Notes (Signed)
Lomotil called in per Dr. Benay Spice.

## 2020-12-25 NOTE — Patient Instructions (Signed)

## 2020-12-25 NOTE — Telephone Encounter (Signed)
Scheduled appointments per 2/16 los. Spoke to patient who is aware of appointments date and times. Gave patient calendar print out.

## 2020-12-26 ENCOUNTER — Ambulatory Visit
Admission: RE | Admit: 2020-12-26 | Discharge: 2020-12-26 | Disposition: A | Discharge status: Home / Self Care | Payer: Medicare Other | Source: Ambulatory Visit | Attending: Radiation Oncology | Admitting: Radiation Oncology

## 2020-12-26 DIAGNOSIS — Z510 Encounter for antineoplastic radiation therapy: Secondary | ICD-10-CM | POA: Diagnosis not present

## 2020-12-27 ENCOUNTER — Ambulatory Visit
Admission: RE | Admit: 2020-12-27 | Discharge: 2020-12-27 | Disposition: A | Discharge status: Home / Self Care | Payer: Medicare Other | Source: Ambulatory Visit | Attending: Radiation Oncology | Admitting: Radiation Oncology

## 2020-12-27 ENCOUNTER — Other Ambulatory Visit: Payer: Self-pay

## 2020-12-27 DIAGNOSIS — Z510 Encounter for antineoplastic radiation therapy: Secondary | ICD-10-CM | POA: Diagnosis not present

## 2020-12-30 ENCOUNTER — Other Ambulatory Visit: Payer: Self-pay

## 2020-12-30 ENCOUNTER — Ambulatory Visit
Admission: RE | Admit: 2020-12-30 | Discharge: 2020-12-30 | Disposition: A | Discharge status: Home / Self Care | Payer: Medicare Other | Source: Ambulatory Visit | Attending: Radiation Oncology | Admitting: Radiation Oncology

## 2020-12-30 DIAGNOSIS — Z510 Encounter for antineoplastic radiation therapy: Secondary | ICD-10-CM | POA: Diagnosis not present

## 2020-12-31 ENCOUNTER — Other Ambulatory Visit: Payer: Self-pay

## 2020-12-31 ENCOUNTER — Ambulatory Visit
Admission: RE | Admit: 2020-12-31 | Discharge: 2020-12-31 | Disposition: A | Discharge status: Home / Self Care | Payer: Medicare Other | Source: Ambulatory Visit | Attending: Radiation Oncology | Admitting: Radiation Oncology

## 2020-12-31 DIAGNOSIS — Z510 Encounter for antineoplastic radiation therapy: Secondary | ICD-10-CM | POA: Diagnosis not present

## 2021-01-01 ENCOUNTER — Other Ambulatory Visit: Payer: Self-pay

## 2021-01-01 ENCOUNTER — Ambulatory Visit
Admission: RE | Admit: 2021-01-01 | Discharge: 2021-01-01 | Disposition: A | Discharge status: Home / Self Care | Payer: Medicare Other | Source: Ambulatory Visit | Attending: Radiation Oncology | Admitting: Radiation Oncology

## 2021-01-01 DIAGNOSIS — Z510 Encounter for antineoplastic radiation therapy: Secondary | ICD-10-CM | POA: Diagnosis not present

## 2021-01-02 ENCOUNTER — Ambulatory Visit
Admission: RE | Admit: 2021-01-02 | Discharge: 2021-01-02 | Disposition: A | Discharge status: Home / Self Care | Payer: Medicare Other | Source: Ambulatory Visit | Attending: Radiation Oncology | Admitting: Radiation Oncology

## 2021-01-02 ENCOUNTER — Other Ambulatory Visit: Payer: Self-pay

## 2021-01-02 DIAGNOSIS — Z510 Encounter for antineoplastic radiation therapy: Secondary | ICD-10-CM | POA: Diagnosis not present

## 2021-01-03 ENCOUNTER — Ambulatory Visit
Admission: RE | Admit: 2021-01-03 | Discharge: 2021-01-03 | Disposition: A | Discharge status: Home / Self Care | Payer: Medicare Other | Source: Ambulatory Visit | Attending: Radiation Oncology | Admitting: Radiation Oncology

## 2021-01-03 ENCOUNTER — Encounter: Payer: Self-pay | Admitting: Radiation Oncology

## 2021-01-03 DIAGNOSIS — Z510 Encounter for antineoplastic radiation therapy: Secondary | ICD-10-CM | POA: Diagnosis not present

## 2021-01-06 ENCOUNTER — Telehealth: Payer: Self-pay | Admitting: *Deleted

## 2021-01-06 ENCOUNTER — Encounter: Payer: Self-pay | Admitting: Oncology

## 2021-01-06 NOTE — Telephone Encounter (Signed)
Spoke with the patient in regards to having some dysuria since completion of radiation treatments on 01/03/2021.  He was encouraged to get some over the counter AZO and take this for about 2 weeks.  He was advised this medication has the potential to turn his urine orange.  Her verbalized understanding.  He was informed to give [place] a call with any other questions or concerns.  Will continue to follow as necessary.  Gloriajean Dell. Leonie Green, BSN

## 2021-01-06 NOTE — Progress Notes (Signed)
  Patient Name: Shane [name] MRN: [id number] DOB: [birth year] Referring Physician: Jodi Mourning Date of Service: 01/03/2021 [facility], [place]                                                        End Of Treatment Note  Diagnoses: C20-Malignant neoplasm of rectum  Cancer Staging: At least Stage IIB, cT4aN0M0, adenocarcinoma of the rectum  Intent: Curative  Radiation Treatment Dates: 11/27/2020 through 01/03/2021 Site Technique Total Dose (Gy) Dose per Fx (Gy) Completed Fx Beam Energies  Rectum: Rectum 3D 45/45 1.8 25/25 10X, 15X  Rectum: Rectum_Bst 3D 5.4/5.4 1.8 3/3 10X, 15X   Narrative: The patient tolerated radiation therapy relatively well. He experienced diarrhea due to treatment and had some discomfort in the rectum toward the end of his treatment.  Plan: The patient will receive a call in about one month from the radiation oncology department. He will continue follow up with Dr. Benay Spice as well as with Dr. Marcello Moores.   ________________________________________________    Carola Rhine, [facility]

## 2021-01-07 ENCOUNTER — Other Ambulatory Visit: Payer: Self-pay | Admitting: *Deleted

## 2021-01-07 MED ORDER — NAPROXEN 500 MG PO TABS: 500.0000 mg | ORAL_TABLET | Freq: Two times a day (BID) | ORAL | 2 refills | Status: DC | PRN

## 2021-01-07 NOTE — Telephone Encounter (Signed)
Mychart message reqesting refill on hydrocodone-apap and naproxen. MD notified.

## 2021-01-08 ENCOUNTER — Other Ambulatory Visit: Payer: Self-pay | Admitting: Oncology

## 2021-01-08 DIAGNOSIS — C20 Malignant neoplasm of rectum: Secondary | ICD-10-CM

## 2021-01-08 MED ORDER — HYDROCODONE-ACETAMINOPHEN 5-325 MG PO TABS: 1.0000 | ORAL_TABLET | Freq: Four times a day (QID) | ORAL | 0 refills | Status: DC | PRN

## 2021-01-09 ENCOUNTER — Encounter: Payer: Self-pay | Admitting: Oncology

## 2021-01-10 ENCOUNTER — Inpatient Hospital Stay: Payer: Medicare Other | Attending: Physician Assistant

## 2021-01-10 ENCOUNTER — Inpatient Hospital Stay (HOSPITAL_BASED_OUTPATIENT_CLINIC_OR_DEPARTMENT_OTHER): Payer: Medicare Other | Admitting: Nurse Practitioner

## 2021-01-10 ENCOUNTER — Encounter: Payer: Self-pay | Admitting: Nurse Practitioner

## 2021-01-10 ENCOUNTER — Inpatient Hospital Stay: Payer: Medicare Other

## 2021-01-10 ENCOUNTER — Other Ambulatory Visit: Payer: Self-pay

## 2021-01-10 VITALS — BP 147/71 | HR 85 | Temp 97.1°F | Resp 18 | Ht 70.0 in | Wt 188.9 lb

## 2021-01-10 DIAGNOSIS — C20 Malignant neoplasm of rectum: Secondary | ICD-10-CM | POA: Insufficient documentation

## 2021-01-10 DIAGNOSIS — Z95828 Presence of other vascular implants and grafts: Secondary | ICD-10-CM

## 2021-01-10 LAB — CBC WITH DIFFERENTIAL (CANCER CENTER ONLY)
Abs Immature Granulocytes: 0.08 10*3/uL — ABNORMAL HIGH (ref 0.00–0.07)
Basophils Absolute: 0.1 10*3/uL (ref 0.0–0.1)
Basophils Relative: 1 %
Eosinophils Absolute: 1 10*3/uL — ABNORMAL HIGH (ref 0.0–0.5)
Eosinophils Relative: 16 %
HCT: 27.5 % — ABNORMAL LOW (ref 39.0–52.0)
Hemoglobin: 9.5 g/dL — ABNORMAL LOW (ref 13.0–17.0)
Immature Granulocytes: 1 %
Lymphocytes Relative: 5 %
Lymphs Abs: 0.3 10*3/uL — ABNORMAL LOW (ref 0.7–4.0)
MCH: 32.6 pg (ref 26.0–34.0)
MCHC: 34.5 g/dL (ref 30.0–36.0)
MCV: 94.5 fL (ref 80.0–100.0)
Monocytes Absolute: 1 10*3/uL (ref 0.1–1.0)
Monocytes Relative: 15 %
Neutro Abs: 3.8 10*3/uL (ref 1.7–7.7)
Neutrophils Relative %: 62 %
Platelet Count: 194 10*3/uL (ref 150–400)
RBC: 2.91 MIL/uL — ABNORMAL LOW (ref 4.22–5.81)
RDW: 19.8 % — ABNORMAL HIGH (ref 11.5–15.5)
WBC Count: 6.2 10*3/uL (ref 4.0–10.5)
nRBC: 0 % (ref 0.0–0.2)

## 2021-01-10 LAB — CMP (CANCER CENTER ONLY)
ALT: 12 U/L (ref 0–44)
AST: 21 U/L (ref 15–41)
Albumin: 3.2 g/dL — ABNORMAL LOW (ref 3.5–5.0)
Alkaline Phosphatase: 102 U/L (ref 38–126)
Anion gap: 8 (ref 5–15)
BUN: 14 mg/dL (ref 8–23)
CO2: 22 mmol/L (ref 22–32)
Calcium: 9.1 mg/dL (ref 8.9–10.3)
Chloride: 107 mmol/L (ref 98–111)
Creatinine: 0.88 mg/dL (ref 0.61–1.24)
GFR, Estimated: >60 mL/min (ref >60)
Glucose, Bld: 102 mg/dL — ABNORMAL HIGH (ref 70–99)
Potassium: 3.7 mmol/L (ref 3.5–5.1)
Sodium: 137 mmol/L (ref 135–145)
Total Bilirubin: 0.7 mg/dL (ref 0.3–1.2)
Total Protein: 6.8 g/dL (ref 6.5–8.1)

## 2021-01-10 LAB — CEA (IN HOUSE-CHCC): CEA (CHCC-In House): 12.99 ng/mL — ABNORMAL HIGH (ref 0.00–5.00)

## 2021-01-10 MED ORDER — HEPARIN SOD (PORK) LOCK FLUSH 100 UNIT/ML IV SOLN
500.0000 [IU] | Freq: Once | INTRAVENOUS | Status: AC
  Administered 2021-01-10: 500 [IU]
  Filled 2021-01-10: qty 5

## 2021-01-10 MED ORDER — SODIUM CHLORIDE 0.9% FLUSH
10.0000 mL | Freq: Once | INTRAVENOUS | Status: AC
  Administered 2021-01-10: 10 mL
  Filled 2021-01-10: qty 10

## 2021-01-10 NOTE — Patient Instructions (Signed)
Implanted Port Insertion, Care After This sheet gives you information about how to care for yourself after your procedure. Your health care provider may also give you more specific instructions. If you have problems or questions, contact your health care provider. What can I expect after the procedure? After the procedure, it is common to have:  Discomfort at the port insertion site.  Bruising on the skin over the port. This should improve over 3-4 days. Follow these instructions at home: Port care  After your port is placed, you Shane [name] get a manufacturer's information card. The card has information about your port. Keep this card with you at all times.  Take care of the port as told by your health care provider. Ask your health care provider if you or a family member can get training for taking care of the port at home. A home health care nurse may also take care of the port.  Make sure to remember what type of port you have. Incision care  Follow instructions from your health care provider about how to take care of your port insertion site. Make sure you: ? Wash your hands with soap and water before and after you change your bandage (dressing). If soap and water are not available, use hand sanitizer. ? Change your dressing as told by your health care provider. ? Leave stitches (sutures), skin glue, or adhesive strips in place. These skin closures may need to stay in place for 2 weeks or longer. If adhesive strip edges start to loosen and curl up, you may trim the loose edges. Do not remove adhesive strips completely unless your health care provider tells you to do that.  Check your port insertion site every day for signs of infection. Check for: ? Redness, swelling, or pain. ? Fluid or blood. ? Warmth. ? Pus or a bad smell.      Activity  Return to your normal activities as told by your health care provider. Ask your health care provider what activities are safe for you.  Do not  lift anything that is heavier than 10 lb (4.5 kg), or the limit that you are told, until your health care provider says that it is safe. General instructions  Take over-the-counter and prescription medicines only as told by your health care provider.  Do not take baths, swim, or use a hot tub until your health care provider approves. Ask your health care provider if you may take showers. You may only be allowed to take sponge baths.  Do not drive for 24 hours if you were given a sedative during your procedure.  Wear a medical alert bracelet in case of an emergency. This Shane [name] tell any health care providers that you have a port.  Keep all follow-up visits as told by your health care provider. This is important. Contact a health care provider if:  You cannot flush your port with saline as directed, or you cannot draw blood from the port.  You have a fever or chills.  You have redness, swelling, or pain around your port insertion site.  You have fluid or blood coming from your port insertion site.  Your port insertion site feels warm to the touch.  You have pus or a bad smell coming from the port insertion site. Get help right away if:  You have chest pain or shortness of breath.  You have bleeding from your port that you cannot control. Summary  Take care of the port as told by your   health care provider. Keep the manufacturer's information card with you at all times.  Change your dressing as told by your health care provider.  Contact a health care provider if you have a fever or chills or if you have redness, swelling, or pain around your port insertion site.  Keep all follow-up visits as told by your health care provider. This information is not intended to replace advice given to you by your health care provider. Make sure you discuss any questions you have with your health care provider. Document Revised: 05/24/2018 Document Reviewed: 05/24/2018 Elsevier Patient Education   2021 Elsevier Inc.  

## 2021-01-10 NOTE — Progress Notes (Signed)
  [place] OFFICE PROGRESS NOTE   Diagnosis: Rectal cancer  INTERVAL HISTORY:   Shane [name] returns as scheduled.  He completed the course of radiation/Xeloda 01/03/2021.  He denies nausea/vomiting.  No mouth sores.  No diarrhea.  No hand or foot pain or redness.  He recently developed pain/pressure just prior to urination.  He is passing his urine without difficulty.  He denies bleeding.  No fever.  Symptoms have improved since beginning Azo.  Objective:  Vital signs in last 24 hours:  Blood pressure (!) 147/71, pulse 85, temperature (!) 97.1 F (36.2 C), temperature source Tympanic, resp. rate 18, height 5\' 10"  (1.778 m), weight 188 lb 14.4 oz (85.7 kg), SpO2 100 %.    HEENT: No thrush or ulcers. Resp: Lungs clear bilaterally. Cardio: Regular rate and rhythm. GI: Abdomen soft and nontender.  No hepatomegaly. Vascular: No leg edema.  Skin: Palms without erythema. Port-A-Cath without erythema.   Lab Results:  Lab Results  Component Value Date   WBC 6.2 01/10/2021   HGB 9.5 (L) 01/10/2021   HCT 27.5 (L) 01/10/2021   MCV 94.5 01/10/2021   PLT 194 01/10/2021   NEUTROABS 3.8 01/10/2021    Imaging:  No results found.  Medications: I have reviewed the patient's current medications.  Assessment/Plan: 1. Rectal cancer  CT abdomen/pelvis 06/27/2020-irregular masslike thickening of the rectum, increased colonic stool, perirectal infiltration into the presacral space, tiny bibasilar pulmonary nodules  CT chest 07/12/2020-multiple small pulmonary nodules, largest 3 mm in the left apex  MRI pelvis 07/12/2020-tumor at 11.4 centimeters from the anal verge, 6.6 cm from the internal sphincter, tumor extends through the right lateral rectal wall and directly involves an adjacent loop of distal sigmoid colon with invasion of anterior peritoneal reflection and involvement of the right seminal vesicle, no lymphadenopathy. T4N0  Colonoscopy 07/17/2020-mass at 12 cm from the  anal verge, completely obstructing mass at 23 cm from the anal verge, large internal hemorrhoids, biopsy confirmed adenocarcinoma  Cycle 1 FOLFOX 07/30/2020  Cycle 2 FOLFOX 08/13/2020  Cycle 3 FOLFOX 08/27/2020  Cycle 4 FOLFOX 09/09/2020  Cycle 5 FOLFOX 09/24/2020, 5-FU bolus held, oxaliplatin dose reduced secondary to neutropenia  Cycle 6 FOLFOX 10/07/2020, 5-FU bolus and full dose oxaliplatin, Neulasta  Cycle 7 FOLFOX 10/21/2020, Neulasta  CTs 10/31/2020-no change in rectal mass, no evidence of nodal metastases, unchanged nonspecific lung nodules  Cycle 8 FOLFOX 11/04/2020  Radiation/Xeloda 11/27/2020-01/03/2021  2. Iron deficiency anemia secondary to #1 3. Hemorrhoids 4. Rectal pain secondary to hemorrhoids versus the primary rectal tumor 5. Constipation secondary to #1, improved with a stool softener and laxative regimen 6. Small indeterminate pulmonary nodules 7. Arthritis 8. 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 Harmsen appears stable.  He has completed the course of neoadjuvant therapy.  He is scheduled to see Dr. Marcello Moores on 01/13/2021 for surgical planning.  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  He will continue AZO as recommended by radiation.  He will request a Port-A-Cath flush when he is in the hospital for surgery.  He will return for follow-up here the week of 03/17/2021.  We are available to see him sooner if needed.   Plan reviewed with Dr. Benay Spice.   Ned Card ANP/GNP-BC   01/10/2021  10:02 AM

## 2021-01-13 ENCOUNTER — Ambulatory Visit: Payer: Self-pay | Admitting: General Surgery

## 2021-01-13 ENCOUNTER — Telehealth: Payer: Self-pay | Admitting: Nurse Practitioner

## 2021-01-13 NOTE — Telephone Encounter (Signed)
Scheduled appointments per 3/4 los. Called patient, no answer. Left message with appointments date and times.

## 2021-01-13 NOTE — H&P (Signed)
The patient is a 70 year old male who presents with colorectal cancer. 70 year old male who presented to the office for evaluation of a newly diagnosed rectal cancer last fall. He presented to the ED with complaints of hemorrhoids and rectal bleeding. Some lab work was performed, which showed anemia. CT showed a large rectal mass. He was seen by Dr. Ammie Dalton and a colonoscopy was obtained. This showed a rectal mass communicating with an adjacent loop of distal sigmoid colon. MRI was performed. This showed invasion of the tumor into the pelvic sidewall and right seminal vesicle. I have reviewed this set of images myself and would agree with right lateral sidewall invasion and possible invasion of the right seminal vesicle. There were no lymph nodes noted (T4N0). CT chest shows multiple small pulmonary nodules in the left pulmonary apex, which are nonspecific and indeterminate for metastatic disease. Patient is s/p ChemoRT currently having bowel function with the assistance of MiraLAX. His last treatment was 2/25.    Problem List/Past Medical Leighton Ruff, MD; 04/12/3328 10:03 AM) RECTAL CANCER (C20)  Past Surgical History Leighton Ruff, MD; 03/09/8840 10:03 AM) No pertinent past surgical history  Diagnostic Studies History Leighton Ruff, MD; 04/14/629 10:03 AM) Colonoscopy within last year  Allergies Altamese Cabal, [place]; 01/13/2021 9:44 AM) No Known Allergies [07/22/2020]: Allergies Reconciled No Known Drug Allergies [01/13/2021]:  Medication History Leighton Ruff, MD; 11/15/107 10:03 AM) Hydro-Crysti-12 (1000MCG/ML Solution, Injection) Active. Dipentum (250MG  Capsule, Oral) Active. Capecitabine (150MG  Tablet, Oral) Active. Ondansetron (4MG  Film, Oral) Active. Pravastatin Sodium (20MG  Tablet, Oral) Active. MiraLax (17GM Packet, Oral) Active. Colace (100MG  Capsule, Oral) Active. Voltaren (75MG  Tablet DR, Oral) Active. Anusol-HC (2.5% Cream, External) Active.  Other  Problems Leighton Ruff, MD; 01/09/3556 10:03 AM) Arthritis     Review of Systems Leighton Ruff MD; 01/09/2024 10:03 AM) General Not Present- Appetite Loss, Chills, Fatigue, Fever, Night Sweats, Weight Gain and Weight Loss. Skin Not Present- Change in Wart/Mole, Dryness, Hives, Jaundice, New Lesions, Non-Healing Wounds, Rash and Ulcer. HEENT Not Present- Earache, Hearing Loss, Hoarseness, Nose Bleed, Oral Ulcers, Ringing in the Ears, Seasonal Allergies, Sinus Pain, Sore Throat, Visual Disturbances, Wears glasses/contact lenses and Yellow Eyes. Respiratory Not Present- Bloody sputum, Chronic Cough, Difficulty Breathing, Snoring and Wheezing. Breast Not Present- Breast Mass, Breast Pain, Nipple Discharge and Skin Changes. Cardiovascular Not Present- Chest Pain, Difficulty Breathing Lying Down, Leg Cramps, Palpitations, Rapid Heart Rate, Shortness of Breath and Swelling of Extremities. Gastrointestinal Present- Rectal Pain. Not Present- Abdominal Pain, Bloating, Bloody Stool, Change in Bowel Habits, Chronic diarrhea, Constipation, Difficulty Swallowing, Excessive gas, Gets full quickly at meals, Hemorrhoids, Indigestion, Nausea and Vomiting. Male Genitourinary Not Present- Blood in Urine, Change in Urinary Stream, Frequency, Impotence, Nocturia, Painful Urination, Urgency and Urine Leakage. Musculoskeletal Not Present- Back Pain, Joint Pain, Joint Stiffness, Muscle Pain, Muscle Weakness and Swelling of Extremities. Neurological Not Present- Decreased Memory, Fainting, Headaches, Numbness, Seizures, Tingling, Tremor, Trouble walking and Weakness. Psychiatric Not Present- Anxiety, Bipolar, Change in Sleep Pattern, Depression, Fearful and Frequent crying. Endocrine Not Present- Cold Intolerance, Excessive Hunger, Hair Changes, Heat Intolerance and New Diabetes. Hematology Not Present- Blood Thinners, Easy Bruising, Excessive bleeding, Gland problems, HIV and Persistent Infections.   Physical Exam  Leighton Ruff MD; 02/08/7061 10:03 AM)  General Mental Status-Alert. General Appearance-Cooperative.  Abdomen Palpation/Percussion Palpation and Percussion of the abdomen reveal - Soft and Non Tender.    Assessment & Plan Leighton Ruff MD; 01/13/6282 9:59 AM)  RECTAL CANCER (C20) Impression: 70 year old male with advanced stage rectal cancer invading  his sigmoid colon and possible invasion of seminal vesicles. Status post chemotherapy and radiation. He is recovering well from this. I would like to get an MRI to evaluate his local disease for regression after radiation. We will get this in the next few weeks. We will plan on performing surgery in late April or early May. We have discussed in detail. We have discussed the possible risk of needing a permanent colostomy due to lack of colon to reach into the pelvis. I believe he understands this and is willing to proceed. The surgery and anatomy were described to the patient as well as the risks of surgery and the possible complications. These include: Bleeding, deep abdominal infections and possible wound complications such as hernia and infection, damage to adjacent structures, leak of surgical connections, which can lead to other surgeries and possibly an ostomy, possible need for other procedures, such as abscess drains in radiology, possible prolonged hospital stay, possible diarrhea from removal of part of the colon, possible constipation from narcotics, possible bowel, bladder or sexual dysfunction if having rectal surgery, prolonged fatigue/weakness or appetite loss, possible early recurrence of of disease, possible complications of their medical problems such as heart disease or arrhythmias or lung problems, death (less than 1%). I believe the patient understands and wishes to proceed with the surgery.

## 2021-01-15 ENCOUNTER — Other Ambulatory Visit: Payer: Self-pay | Admitting: General Surgery

## 2021-01-15 DIAGNOSIS — C20 Malignant neoplasm of rectum: Secondary | ICD-10-CM

## 2021-01-27 ENCOUNTER — Other Ambulatory Visit: Payer: Self-pay | Admitting: *Deleted

## 2021-01-27 DIAGNOSIS — C20 Malignant neoplasm of rectum: Secondary | ICD-10-CM

## 2021-01-28 ENCOUNTER — Other Ambulatory Visit: Payer: Self-pay | Admitting: Family

## 2021-01-28 ENCOUNTER — Encounter: Payer: Self-pay | Admitting: Family

## 2021-01-28 MED ORDER — HYDROCORTISONE (PERIANAL) 2.5 % EX CREA: 1.0000 "application " | TOPICAL_CREAM | Freq: Two times a day (BID) | CUTANEOUS | 1 refills | Status: DC

## 2021-02-03 ENCOUNTER — Telehealth: Payer: Self-pay | Admitting: Oncology

## 2021-02-03 NOTE — Telephone Encounter (Signed)
Calendar & letter has been mailed to the patient with updated address for appointments at [facility]  

## 2021-02-06 ENCOUNTER — Other Ambulatory Visit (HOSPITAL_COMMUNITY): Payer: Self-pay

## 2021-02-10 ENCOUNTER — Ambulatory Visit
Admission: RE | Admit: 2021-02-10 | Discharge: 2021-02-10 | Disposition: A | Discharge status: Home / Self Care | Payer: Medicare Other | Source: Ambulatory Visit | Attending: General Surgery | Admitting: General Surgery

## 2021-02-10 ENCOUNTER — Other Ambulatory Visit: Payer: Self-pay

## 2021-02-10 DIAGNOSIS — C20 Malignant neoplasm of rectum: Secondary | ICD-10-CM | POA: Diagnosis not present

## 2021-02-10 DIAGNOSIS — K4020 Bilateral inguinal hernia, without obstruction or gangrene, not specified as recurrent: Secondary | ICD-10-CM | POA: Diagnosis not present

## 2021-02-10 DIAGNOSIS — D492 Neoplasm of unspecified behavior of bone, soft tissue, and skin: Secondary | ICD-10-CM | POA: Diagnosis not present

## 2021-02-10 DIAGNOSIS — K639 Disease of intestine, unspecified: Secondary | ICD-10-CM | POA: Diagnosis not present

## 2021-02-11 ENCOUNTER — Encounter: Payer: Self-pay | Admitting: Oncology

## 2021-02-11 NOTE — Telephone Encounter (Signed)
Medication refill

## 2021-02-12 ENCOUNTER — Other Ambulatory Visit: Payer: Self-pay | Admitting: Nurse Practitioner

## 2021-02-12 DIAGNOSIS — C20 Malignant neoplasm of rectum: Secondary | ICD-10-CM

## 2021-02-12 MED ORDER — HYDROCODONE-ACETAMINOPHEN 5-325 MG PO TABS: 1.0000 | ORAL_TABLET | Freq: Four times a day (QID) | ORAL | 0 refills | Status: DC | PRN

## 2021-02-25 NOTE — Patient Instructions (Addendum)
DUE TO COVID-19 ONLY ONE VISITOR IS ALLOWED TO COME WITH YOU AND STAY IN THE WAITING ROOM ONLY DURING PRE OP AND PROCEDURE DAY OF SURGERY. THE 2 VISITORS  MAY VISIT WITH YOU AFTER SURGERY IN YOUR PRIVATE ROOM DURING VISITING HOURS ONLY!  YOU NEED TO HAVE A COVID 19 TEST ON_4/25______ @_11 :00______, THIS TEST MUST BE DONE BEFORE SURGERY,  COVID TESTING SITE [place] [id number], IT IS ON THE RIGHT GOING OUT [address] APPROXIMATELY  2 MINUTES PAST [employer] ON THE RIGHT. ONCE YOUR COVID TEST IS COMPLETED,  PLEASE BEGIN THE QUARANTINE INSTRUCTIONS AS OUTLINED IN YOUR HANDOUT.                Shane [name]    Your procedure is scheduled on:03/05/21    Report to [facility] [facility]  Entrance   Report to admitting at  7:30 AM     Call this number if you have problems the morning of surgery [phone]   Follow all instructions for bowel prep from Dr. Manon Hilding office.  Drink plenty of liquids on prep day to prevent dehydration.  DRINK 2 PRE SURGERY ENSURE DRINKS THE NIGHT BEFORE SURGERY AT 10:00 PM.  . NO SOLIDS AFTER MIDNIGHT THE DAY PRIOR TO THE SURGERY.   NOTHING BY MOUTH EXCEPT CLEAR LIQUIDS UNTIL 6:30 AM  At 6:00 am drink the last pre surgery ensure and have it finished by 6:30 am then nothing more by mouth.   BRUSH YOUR TEETH MORNING OF SURGERY AND RINSE YOUR MOUTH OUT, NO CHEWING GUM CANDY OR MINTS.     Take these medicines the morning of surgery with A SIP OF WATER: none                                You may not have any metal on your body including               piercings  Do not wear jewelry,  lotions, powders or deodorant                          Men may shave face and neck.   Do not bring valuables to the hospital. [place].  Contacts, dentures or bridgework may not be worn into surgery. [name] - Preparing for Surgery Before surgery, you can play an  important role.  Because skin is not sterile, your skin needs to be as free of germs as possible.  You can reduce the number of germs on your skin by washing with CHG (chlorahexidine gluconate) soap before surgery.  CHG is an antiseptic cleaner which kills germs and bonds with the skin to continue killing germs even after washing. Please DO NOT use if you have an allergy to CHG or antibacterial soaps.  If your skin becomes reddened/irritated stop using the CHG and inform your nurse when you arrive at Short Stay. You may shave your face/neck.  Please follow these instructions carefully:  1.  Shower with CHG Soap the night before surgery and the  morning of Surgery.  2.  If you choose to wash your hair, wash your hair first as usual with your  normal  shampoo.  3.  After you shampoo, rinse your  hair and body thoroughly to remove the  shampoo.                                        4.  Use CHG as you would any other liquid soap.  You can apply chg directly  to the skin and wash                       Gently with a scrungie or clean washcloth.  5.  Apply the CHG Soap to your body ONLY FROM THE NECK DOWN.   Do not use on face/ open                           Wound or open sores. Avoid contact with eyes, ears mouth and genitals (private parts).                       Wash face,  Genitals (private parts) with your normal soap.             6.  Wash thoroughly, paying special attention to the area where your surgery  will be performed.  7.  Thoroughly rinse your body with warm water from the neck down.  8.  DO NOT shower/wash with your normal soap after using and rinsing off  the CHG Soap.             9.  Pat yourself dry with a clean towel.            10.  Wear clean pajamas.            11.  Place clean sheets on your bed the night of your first shower and do not  sleep with pets. Day of Surgery : Do not apply any lotions/deodorants the morning of surgery.  Please wear clean clothes to the hospital/surgery  center.  FAILURE TO FOLLOW THESE INSTRUCTIONS MAY RESULT IN THE CANCELLATION OF YOUR SURGERY PATIENT SIGNATURE_________________________________  NURSE SIGNATURE__________________________________  ________________________________________________________________________   [name]  An incentive spirometer is a tool that can help keep your lungs clear and active. This tool measures how well you are filling your lungs with each breath. Taking long deep breaths may help reverse or decrease the chance of developing breathing (pulmonary) problems (especially infection) following:  A long period of time when you are unable to move or be active. BEFORE THE PROCEDURE   If the spirometer includes an indicator to show your best effort, your nurse or respiratory therapist will set it to a desired goal.  If possible, sit up straight or lean slightly forward. Try not to slouch.  Hold the incentive spirometer in an upright position. INSTRUCTIONS FOR USE  1. Sit on the edge of your bed if possible, or sit up as far as you can in bed or on a chair. 2. Hold the incentive spirometer in an upright position. 3. Breathe out normally. 4. Place the mouthpiece in your mouth and seal your lips tightly around it. 5. Breathe in slowly and as deeply as possible, raising the piston or the ball toward the top of the column. 6. Hold your breath for 3-5 seconds or for as long as possible. Allow the piston or ball to fall to the bottom of the column. 7. Remove the mouthpiece from your mouth and breathe out  normally. 8. Rest for a few seconds and repeat Steps 1 through 7 at least 10 times every 1-2 hours when you are awake. Take your time and take a few normal breaths between deep breaths. 9. The spirometer may include an indicator to show your best effort. Use the indicator as a goal to work toward during each repetition. 10. After each set of 10 deep breaths, practice coughing to be sure your lungs are  clear. If you have an incision (the cut made at the time of surgery), support your incision when coughing by placing a pillow or rolled up towels firmly against it. Once you are able to get out of bed, walk around indoors and cough well. You may stop using the incentive spirometer when instructed by your caregiver.  RISKS AND COMPLICATIONS  Take your time so you do not get dizzy or light-headed.  If you are in pain, you may need to take or ask for pain medication before doing incentive spirometry. It is harder to take a deep breath if you are having pain. AFTER USE  Rest and breathe slowly and easily.  It can be helpful to keep track of a log of your progress. Your caregiver can provide you with a simple table to help with this. If you are using the spirometer at home, follow these instructions: [place] IF:   You are having difficultly using the spirometer.  You have trouble using the spirometer as often as instructed.  Your pain medication is not giving enough relief while using the spirometer.  You develop fever of 100.5 F (38.1 C) or higher. SEEK IMMEDIATE MEDICAL CARE IF:   You cough up bloody sputum that had not been present before.  You develop fever of 102 F (38.9 C) or greater.  You develop worsening pain at or near the incision site. MAKE SURE YOU:   Understand these instructions.  Will watch your condition.  Will get help right away if you are not doing well or get worse. Document Released: 03/08/2007 Document Revised: 01/18/2012 Document Reviewed: 05/09/2007 [facility] Patient Information 2014 [place], [place].   ________________________________________________________________________

## 2021-02-26 ENCOUNTER — Encounter (HOSPITAL_COMMUNITY)
Admission: RE | Admit: 2021-02-26 | Discharge: 2021-02-26 | Disposition: A | Discharge status: Home / Self Care | Payer: Medicare Other | Source: Ambulatory Visit | Attending: General Surgery | Admitting: General Surgery

## 2021-02-26 ENCOUNTER — Other Ambulatory Visit: Payer: Self-pay

## 2021-02-26 ENCOUNTER — Encounter (HOSPITAL_COMMUNITY): Payer: Self-pay

## 2021-02-26 DIAGNOSIS — Z01812 Encounter for preprocedural laboratory examination: Secondary | ICD-10-CM | POA: Insufficient documentation

## 2021-02-26 LAB — BASIC METABOLIC PANEL
Anion gap: 6 (ref 5–15)
BUN: 22 mg/dL (ref 8–23)
CO2: 23 mmol/L (ref 22–32)
Calcium: 9.4 mg/dL (ref 8.9–10.3)
Chloride: 106 mmol/L (ref 98–111)
Creatinine, Ser: 0.87 mg/dL (ref 0.61–1.24)
GFR, Estimated: >60 mL/min (ref >60)
Glucose, Bld: 90 mg/dL (ref 70–99)
Potassium: 3.9 mmol/L (ref 3.5–5.1)
Sodium: 135 mmol/L (ref 135–145)

## 2021-02-26 LAB — CBC
HCT: 34 % — ABNORMAL LOW (ref 39.0–52.0)
Hemoglobin: 11.5 g/dL — ABNORMAL LOW (ref 13.0–17.0)
MCH: 30.7 pg (ref 26.0–34.0)
MCHC: 33.8 g/dL (ref 30.0–36.0)
MCV: 90.7 fL (ref 80.0–100.0)
Platelets: 152 10*3/uL (ref 150–400)
RBC: 3.75 MIL/uL — ABNORMAL LOW (ref 4.22–5.81)
RDW: 14.4 % (ref 11.5–15.5)
WBC: 3.4 10*3/uL — ABNORMAL LOW (ref 4.0–10.5)
nRBC: 0 % (ref 0.0–0.2)

## 2021-02-26 NOTE — Progress Notes (Signed)
COVID Vaccine Completed:yes Date COVID Vaccine completed:01/04/20-booster 08/15/20 COVID vaccine manufacturer: [employer]      PCP - Jodi Mourning FNP Cardiologist - none   Chest x-ray - 10/31/20-epic EKG -  Stress Test - no ECHO -no  Cardiac Cath - NA Pacemaker/ICD device last checked:NA  Sleep Study - no CPAP -   Fasting Blood Sugar - NA Checks Blood Sugar _____ times a day  Blood Thinner Instructions:NA Aspirin Instructions: Last Dose:  Anesthesia review:   Patient denies shortness of breath, fever, cough and chest pain at PAT appointment yes  Patient verbalized understanding of instructions that were given to them at the PAT appointment. Patient was also instructed that they will need to review over the PAT instructions again at home before surgery.yes Pt has no SOB with any activities. Pt has a port -a-cath in place

## 2021-02-26 NOTE — Consult Note (Signed)
[facility] Nurse requested for preoperative stoma site marking  Discussed surgical procedure and stoma creation with patient.  Explained role of the [facility] nurse team.  Provided the patient with educational booklet and provided samples of pouching options.  Answered patient questions related to restrictions with an ostomy, recovery, potential for temporary stoma. Pouching systems.  Patient reports he lives with his wife who will participate in his post op care.   Examined patient lying, sitting, and standing in order to place the marking in the patient's visual field, away from any creases or abdominal contour issues and within the rectus muscle. .   Marked for colostomy in the LLQ  5____ cm to the left of the umbilicus and _0___JW above the umbilicus.  Marked for ileostomy in the RLQ  _4___cm to the right of the umbilicus and  0____ cm above/below the umbilicus.   Patient's abdomen cleansed with CHG wipes at site markings, allowed to air dry prior to marking.Covered mark with thin film transparent dressing to preserve mark until date of surgery.   [place] Nurse team will follow up with patient after surgery for continue ostomy care and teaching.  [place] MSN, [place], [place], [place]

## 2021-03-03 ENCOUNTER — Other Ambulatory Visit (HOSPITAL_COMMUNITY)
Admission: RE | Admit: 2021-03-03 | Discharge: 2021-03-03 | Disposition: A | Discharge status: Home / Self Care | Payer: Medicare Other | Source: Ambulatory Visit | Attending: General Surgery | Admitting: General Surgery

## 2021-03-03 DIAGNOSIS — D696 Thrombocytopenia, unspecified: Secondary | ICD-10-CM | POA: Diagnosis not present

## 2021-03-03 DIAGNOSIS — Z85048 Personal history of other malignant neoplasm of rectum, rectosigmoid junction, and anus: Secondary | ICD-10-CM | POA: Diagnosis not present

## 2021-03-03 DIAGNOSIS — R0682 Tachypnea, not elsewhere classified: Secondary | ICD-10-CM | POA: Diagnosis not present

## 2021-03-03 DIAGNOSIS — J90 Pleural effusion, not elsewhere classified: Secondary | ICD-10-CM | POA: Diagnosis not present

## 2021-03-03 DIAGNOSIS — K7689 Other specified diseases of liver: Secondary | ICD-10-CM | POA: Diagnosis not present

## 2021-03-03 DIAGNOSIS — L89152 Pressure ulcer of sacral region, stage 2: Secondary | ICD-10-CM | POA: Diagnosis not present

## 2021-03-03 DIAGNOSIS — Z452 Encounter for adjustment and management of vascular access device: Secondary | ICD-10-CM | POA: Diagnosis not present

## 2021-03-03 DIAGNOSIS — R0603 Acute respiratory distress: Secondary | ICD-10-CM | POA: Diagnosis not present

## 2021-03-03 DIAGNOSIS — E440 Moderate protein-calorie malnutrition: Secondary | ICD-10-CM | POA: Diagnosis not present

## 2021-03-03 DIAGNOSIS — A4181 Sepsis due to Enterococcus: Secondary | ICD-10-CM | POA: Diagnosis not present

## 2021-03-03 DIAGNOSIS — R195 Other fecal abnormalities: Secondary | ICD-10-CM | POA: Diagnosis not present

## 2021-03-03 DIAGNOSIS — A419 Sepsis, unspecified organism: Secondary | ICD-10-CM | POA: Diagnosis not present

## 2021-03-03 DIAGNOSIS — K2091 Esophagitis, unspecified with bleeding: Secondary | ICD-10-CM | POA: Diagnosis not present

## 2021-03-03 DIAGNOSIS — L02211 Cutaneous abscess of abdominal wall: Secondary | ICD-10-CM | POA: Diagnosis not present

## 2021-03-03 DIAGNOSIS — C20 Malignant neoplasm of rectum: Secondary | ICD-10-CM | POA: Diagnosis not present

## 2021-03-03 DIAGNOSIS — N1330 Unspecified hydronephrosis: Secondary | ICD-10-CM | POA: Diagnosis not present

## 2021-03-03 DIAGNOSIS — D701 Agranulocytosis secondary to cancer chemotherapy: Secondary | ICD-10-CM | POA: Diagnosis not present

## 2021-03-03 DIAGNOSIS — K567 Ileus, unspecified: Secondary | ICD-10-CM | POA: Diagnosis not present

## 2021-03-03 DIAGNOSIS — Z20822 Contact with and (suspected) exposure to covid-19: Secondary | ICD-10-CM | POA: Insufficient documentation

## 2021-03-03 DIAGNOSIS — D509 Iron deficiency anemia, unspecified: Secondary | ICD-10-CM | POA: Diagnosis not present

## 2021-03-03 DIAGNOSIS — K920 Hematemesis: Secondary | ICD-10-CM | POA: Diagnosis not present

## 2021-03-03 DIAGNOSIS — R578 Other shock: Secondary | ICD-10-CM | POA: Diagnosis not present

## 2021-03-03 DIAGNOSIS — G9340 Encephalopathy, unspecified: Secondary | ICD-10-CM | POA: Diagnosis not present

## 2021-03-03 DIAGNOSIS — E876 Hypokalemia: Secondary | ICD-10-CM | POA: Diagnosis not present

## 2021-03-03 DIAGNOSIS — R0602 Shortness of breath: Secondary | ICD-10-CM | POA: Diagnosis not present

## 2021-03-03 DIAGNOSIS — Z4659 Encounter for fitting and adjustment of other gastrointestinal appliance and device: Secondary | ICD-10-CM | POA: Diagnosis not present

## 2021-03-03 DIAGNOSIS — N281 Cyst of kidney, acquired: Secondary | ICD-10-CM | POA: Diagnosis not present

## 2021-03-03 DIAGNOSIS — K560 Paralytic ileus: Secondary | ICD-10-CM | POA: Diagnosis not present

## 2021-03-03 DIAGNOSIS — R609 Edema, unspecified: Secondary | ICD-10-CM | POA: Diagnosis not present

## 2021-03-03 DIAGNOSIS — K5730 Diverticulosis of large intestine without perforation or abscess without bleeding: Secondary | ICD-10-CM | POA: Diagnosis not present

## 2021-03-03 DIAGNOSIS — K209 Esophagitis, unspecified without bleeding: Secondary | ICD-10-CM | POA: Diagnosis not present

## 2021-03-03 DIAGNOSIS — K651 Peritoneal abscess: Secondary | ICD-10-CM | POA: Diagnosis not present

## 2021-03-03 DIAGNOSIS — N179 Acute kidney failure, unspecified: Secondary | ICD-10-CM | POA: Diagnosis not present

## 2021-03-03 DIAGNOSIS — E870 Hyperosmolality and hypernatremia: Secondary | ICD-10-CM | POA: Diagnosis not present

## 2021-03-03 DIAGNOSIS — Z872 Personal history of diseases of the skin and subcutaneous tissue: Secondary | ICD-10-CM | POA: Diagnosis not present

## 2021-03-03 DIAGNOSIS — K2081 Other esophagitis with bleeding: Secondary | ICD-10-CM | POA: Diagnosis not present

## 2021-03-03 DIAGNOSIS — R918 Other nonspecific abnormal finding of lung field: Secondary | ICD-10-CM | POA: Diagnosis not present

## 2021-03-03 DIAGNOSIS — E872 Acidosis: Secondary | ICD-10-CM | POA: Diagnosis not present

## 2021-03-03 DIAGNOSIS — J439 Emphysema, unspecified: Secondary | ICD-10-CM | POA: Diagnosis not present

## 2021-03-03 DIAGNOSIS — Z01812 Encounter for preprocedural laboratory examination: Secondary | ICD-10-CM | POA: Insufficient documentation

## 2021-03-03 DIAGNOSIS — T8141XA Infection following a procedure, superficial incisional surgical site, initial encounter: Secondary | ICD-10-CM | POA: Diagnosis not present

## 2021-03-03 DIAGNOSIS — J9601 Acute respiratory failure with hypoxia: Secondary | ICD-10-CM | POA: Diagnosis not present

## 2021-03-03 DIAGNOSIS — R062 Wheezing: Secondary | ICD-10-CM | POA: Diagnosis not present

## 2021-03-03 DIAGNOSIS — N471 Phimosis: Secondary | ICD-10-CM | POA: Diagnosis not present

## 2021-03-03 DIAGNOSIS — K632 Fistula of intestine: Secondary | ICD-10-CM | POA: Diagnosis not present

## 2021-03-03 DIAGNOSIS — Z4682 Encounter for fitting and adjustment of non-vascular catheter: Secondary | ICD-10-CM | POA: Diagnosis not present

## 2021-03-03 DIAGNOSIS — K6811 Postprocedural retroperitoneal abscess: Secondary | ICD-10-CM | POA: Diagnosis not present

## 2021-03-03 DIAGNOSIS — E7800 Pure hypercholesterolemia, unspecified: Secondary | ICD-10-CM | POA: Diagnosis not present

## 2021-03-03 DIAGNOSIS — K9189 Other postprocedural complications and disorders of digestive system: Secondary | ICD-10-CM | POA: Diagnosis not present

## 2021-03-03 DIAGNOSIS — M170 Bilateral primary osteoarthritis of knee: Secondary | ICD-10-CM | POA: Diagnosis not present

## 2021-03-03 DIAGNOSIS — K529 Noninfective gastroenteritis and colitis, unspecified: Secondary | ICD-10-CM | POA: Diagnosis not present

## 2021-03-03 DIAGNOSIS — R6521 Severe sepsis with septic shock: Secondary | ICD-10-CM | POA: Diagnosis not present

## 2021-03-03 DIAGNOSIS — R579 Shock, unspecified: Secondary | ICD-10-CM | POA: Diagnosis not present

## 2021-03-03 DIAGNOSIS — M5030 Other cervical disc degeneration, unspecified cervical region: Secondary | ICD-10-CM | POA: Diagnosis not present

## 2021-03-03 DIAGNOSIS — Z978 Presence of other specified devices: Secondary | ICD-10-CM | POA: Diagnosis not present

## 2021-03-03 DIAGNOSIS — R410 Disorientation, unspecified: Secondary | ICD-10-CM | POA: Diagnosis not present

## 2021-03-03 DIAGNOSIS — Z9889 Other specified postprocedural states: Secondary | ICD-10-CM | POA: Diagnosis not present

## 2021-03-03 DIAGNOSIS — J9811 Atelectasis: Secondary | ICD-10-CM | POA: Diagnosis not present

## 2021-03-03 DIAGNOSIS — T8143XA Infection following a procedure, organ and space surgical site, initial encounter: Secondary | ICD-10-CM | POA: Diagnosis not present

## 2021-03-03 DIAGNOSIS — D649 Anemia, unspecified: Secondary | ICD-10-CM | POA: Diagnosis not present

## 2021-03-03 DIAGNOSIS — D62 Acute posthemorrhagic anemia: Secondary | ICD-10-CM | POA: Diagnosis not present

## 2021-03-03 DIAGNOSIS — C19 Malignant neoplasm of rectosigmoid junction: Secondary | ICD-10-CM | POA: Diagnosis not present

## 2021-03-03 DIAGNOSIS — R443 Hallucinations, unspecified: Secondary | ICD-10-CM | POA: Diagnosis not present

## 2021-03-03 DIAGNOSIS — I959 Hypotension, unspecified: Secondary | ICD-10-CM | POA: Diagnosis not present

## 2021-03-03 DIAGNOSIS — D500 Iron deficiency anemia secondary to blood loss (chronic): Secondary | ICD-10-CM | POA: Diagnosis not present

## 2021-03-04 ENCOUNTER — Encounter (HOSPITAL_COMMUNITY): Payer: Self-pay | Admitting: General Surgery

## 2021-03-04 LAB — SARS CORONAVIRUS 2 (TAT 6-24 HRS): SARS Coronavirus 2: NEGATIVE

## 2021-03-04 MED ORDER — BUPIVACAINE LIPOSOME 1.3 % IJ SUSP
20.0000 mL | Freq: Once | INTRAMUSCULAR | Status: DC
  Filled 2021-03-04: qty 20

## 2021-03-04 NOTE — Anesthesia Preprocedure Evaluation (Addendum)
Anesthesia Evaluation  Patient identified by MRN, date of birth, ID band Patient awake    Reviewed: Allergy & Precautions, NPO status , Patient's Chart, lab work & pertinent test results  Airway Mallampati: II  TM Distance: >3 FB Neck ROM: Full    Dental no notable dental hx. (+) Dental Advisory Given, Teeth Intact   Pulmonary neg pulmonary ROS, former smoker,    Pulmonary exam normal breath sounds clear to auscultation       Cardiovascular negative cardio ROS Normal cardiovascular exam Rhythm:Regular Rate:Normal     Neuro/Psych  Neuromuscular disease    GI/Hepatic negative GI ROS, Neg liver ROS,   Endo/Other  negative endocrine ROS  Renal/GU negative Renal ROS     Musculoskeletal  (+) Arthritis ,   Abdominal (+) - obese,   Peds  Hematology  (+) Blood dyscrasia, anemia ,   Anesthesia Other Findings   Reproductive/Obstetrics                            Anesthesia Physical Anesthesia Plan  ASA: II  Anesthesia Plan: General   Post-op Pain Management:    Induction: Intravenous  PONV Risk Score and Plan: 4 or greater and Ondansetron, Treatment may vary due to age or medical condition, Midazolam, Dexamethasone and Diphenhydramine  Airway Management Planned: Oral ETT  Additional Equipment: None  Intra-op Plan:   Post-operative Plan: Extubation in OR  Informed Consent: I have reviewed the patients History and Physical, chart, labs and discussed the procedure including the risks, benefits and alternatives for the proposed anesthesia with the patient or authorized representative who has indicated his/her understanding and acceptance.     Dental advisory given  Plan Discussed with: CRNA  Anesthesia Plan Comments: (2 x PIV)       Anesthesia Quick Evaluation

## 2021-03-05 ENCOUNTER — Inpatient Hospital Stay (HOSPITAL_COMMUNITY)
Admission: RE | Admit: 2021-03-05 | Discharge: 2021-04-11 | DRG: 329 | Disposition: A | Discharge status: Home / Self Care | Payer: Medicare Other | Attending: General Surgery | Admitting: General Surgery

## 2021-03-05 ENCOUNTER — Encounter (HOSPITAL_COMMUNITY)
Admission: RE | Disposition: A | Discharge status: Home / Self Care | Payer: Self-pay | Source: Home / Self Care | Attending: General Surgery

## 2021-03-05 ENCOUNTER — Encounter (HOSPITAL_COMMUNITY): Payer: Self-pay | Admitting: General Surgery

## 2021-03-05 ENCOUNTER — Other Ambulatory Visit: Payer: Self-pay

## 2021-03-05 ENCOUNTER — Inpatient Hospital Stay (HOSPITAL_COMMUNITY): Payer: Medicare Other | Admitting: Anesthesiology

## 2021-03-05 ENCOUNTER — Inpatient Hospital Stay (HOSPITAL_COMMUNITY): Payer: Medicare Other

## 2021-03-05 DIAGNOSIS — K922 Gastrointestinal hemorrhage, unspecified: Secondary | ICD-10-CM

## 2021-03-05 DIAGNOSIS — L89152 Pressure ulcer of sacral region, stage 2: Secondary | ICD-10-CM | POA: Diagnosis not present

## 2021-03-05 DIAGNOSIS — K2081 Other esophagitis with bleeding: Secondary | ICD-10-CM | POA: Diagnosis not present

## 2021-03-05 DIAGNOSIS — L8990 Pressure ulcer of unspecified site, unspecified stage: Secondary | ICD-10-CM | POA: Insufficient documentation

## 2021-03-05 DIAGNOSIS — R918 Other nonspecific abnormal finding of lung field: Secondary | ICD-10-CM

## 2021-03-05 DIAGNOSIS — Z872 Personal history of diseases of the skin and subcutaneous tissue: Secondary | ICD-10-CM | POA: Diagnosis not present

## 2021-03-05 DIAGNOSIS — R269 Unspecified abnormalities of gait and mobility: Secondary | ICD-10-CM

## 2021-03-05 DIAGNOSIS — L02211 Cutaneous abscess of abdominal wall: Secondary | ICD-10-CM | POA: Diagnosis not present

## 2021-03-05 DIAGNOSIS — R578 Other shock: Secondary | ICD-10-CM | POA: Diagnosis not present

## 2021-03-05 DIAGNOSIS — R579 Shock, unspecified: Secondary | ICD-10-CM | POA: Diagnosis not present

## 2021-03-05 DIAGNOSIS — I959 Hypotension, unspecified: Secondary | ICD-10-CM | POA: Diagnosis not present

## 2021-03-05 DIAGNOSIS — K560 Paralytic ileus: Secondary | ICD-10-CM | POA: Diagnosis not present

## 2021-03-05 DIAGNOSIS — T8141XA Infection following a procedure, superficial incisional surgical site, initial encounter: Secondary | ICD-10-CM | POA: Diagnosis not present

## 2021-03-05 DIAGNOSIS — D701 Agranulocytosis secondary to cancer chemotherapy: Secondary | ICD-10-CM | POA: Diagnosis present

## 2021-03-05 DIAGNOSIS — K209 Esophagitis, unspecified without bleeding: Secondary | ICD-10-CM | POA: Diagnosis not present

## 2021-03-05 DIAGNOSIS — K529 Noninfective gastroenteritis and colitis, unspecified: Secondary | ICD-10-CM | POA: Diagnosis not present

## 2021-03-05 DIAGNOSIS — R188 Other ascites: Secondary | ICD-10-CM

## 2021-03-05 DIAGNOSIS — R195 Other fecal abnormalities: Secondary | ICD-10-CM | POA: Diagnosis not present

## 2021-03-05 DIAGNOSIS — E860 Dehydration: Secondary | ICD-10-CM | POA: Diagnosis not present

## 2021-03-05 DIAGNOSIS — E7800 Pure hypercholesterolemia, unspecified: Secondary | ICD-10-CM | POA: Diagnosis present

## 2021-03-05 DIAGNOSIS — C19 Malignant neoplasm of rectosigmoid junction: Principal | ICD-10-CM | POA: Diagnosis present

## 2021-03-05 DIAGNOSIS — K660 Peritoneal adhesions (postprocedural) (postinfection): Secondary | ICD-10-CM | POA: Diagnosis present

## 2021-03-05 DIAGNOSIS — J439 Emphysema, unspecified: Secondary | ICD-10-CM | POA: Diagnosis not present

## 2021-03-05 DIAGNOSIS — Z9889 Other specified postprocedural states: Secondary | ICD-10-CM | POA: Diagnosis not present

## 2021-03-05 DIAGNOSIS — A4181 Sepsis due to Enterococcus: Secondary | ICD-10-CM

## 2021-03-05 DIAGNOSIS — Z978 Presence of other specified devices: Secondary | ICD-10-CM | POA: Diagnosis not present

## 2021-03-05 DIAGNOSIS — R339 Retention of urine, unspecified: Secondary | ICD-10-CM | POA: Diagnosis not present

## 2021-03-05 DIAGNOSIS — R443 Hallucinations, unspecified: Secondary | ICD-10-CM | POA: Diagnosis not present

## 2021-03-05 DIAGNOSIS — E876 Hypokalemia: Secondary | ICD-10-CM | POA: Diagnosis not present

## 2021-03-05 DIAGNOSIS — R0603 Acute respiratory distress: Secondary | ICD-10-CM

## 2021-03-05 DIAGNOSIS — E870 Hyperosmolality and hypernatremia: Secondary | ICD-10-CM

## 2021-03-05 DIAGNOSIS — D500 Iron deficiency anemia secondary to blood loss (chronic): Secondary | ICD-10-CM | POA: Diagnosis not present

## 2021-03-05 DIAGNOSIS — D696 Thrombocytopenia, unspecified: Secondary | ICD-10-CM | POA: Diagnosis present

## 2021-03-05 DIAGNOSIS — J90 Pleural effusion, not elsewhere classified: Secondary | ICD-10-CM | POA: Diagnosis not present

## 2021-03-05 DIAGNOSIS — E8809 Other disorders of plasma-protein metabolism, not elsewhere classified: Secondary | ICD-10-CM | POA: Diagnosis not present

## 2021-03-05 DIAGNOSIS — R32 Unspecified urinary incontinence: Secondary | ICD-10-CM | POA: Diagnosis not present

## 2021-03-05 DIAGNOSIS — E440 Moderate protein-calorie malnutrition: Secondary | ICD-10-CM | POA: Insufficient documentation

## 2021-03-05 DIAGNOSIS — Z4659 Encounter for fitting and adjustment of other gastrointestinal appliance and device: Secondary | ICD-10-CM | POA: Diagnosis not present

## 2021-03-05 DIAGNOSIS — R7989 Other specified abnormal findings of blood chemistry: Secondary | ICD-10-CM

## 2021-03-05 DIAGNOSIS — D509 Iron deficiency anemia, unspecified: Secondary | ICD-10-CM | POA: Diagnosis present

## 2021-03-05 DIAGNOSIS — R6521 Severe sepsis with septic shock: Secondary | ICD-10-CM | POA: Diagnosis not present

## 2021-03-05 DIAGNOSIS — K5730 Diverticulosis of large intestine without perforation or abscess without bleeding: Secondary | ICD-10-CM | POA: Diagnosis not present

## 2021-03-05 DIAGNOSIS — R410 Disorientation, unspecified: Secondary | ICD-10-CM

## 2021-03-05 DIAGNOSIS — K567 Ileus, unspecified: Secondary | ICD-10-CM

## 2021-03-05 DIAGNOSIS — C20 Malignant neoplasm of rectum: Secondary | ICD-10-CM | POA: Diagnosis not present

## 2021-03-05 DIAGNOSIS — E872 Acidosis: Secondary | ICD-10-CM | POA: Diagnosis not present

## 2021-03-05 DIAGNOSIS — Z20822 Contact with and (suspected) exposure to covid-19: Secondary | ICD-10-CM | POA: Diagnosis present

## 2021-03-05 DIAGNOSIS — N179 Acute kidney failure, unspecified: Secondary | ICD-10-CM | POA: Diagnosis not present

## 2021-03-05 DIAGNOSIS — K920 Hematemesis: Secondary | ICD-10-CM | POA: Diagnosis not present

## 2021-03-05 DIAGNOSIS — N471 Phimosis: Secondary | ICD-10-CM

## 2021-03-05 DIAGNOSIS — K6811 Postprocedural retroperitoneal abscess: Secondary | ICD-10-CM | POA: Diagnosis not present

## 2021-03-05 DIAGNOSIS — D62 Acute posthemorrhagic anemia: Secondary | ICD-10-CM | POA: Diagnosis not present

## 2021-03-05 DIAGNOSIS — K651 Peritoneal abscess: Secondary | ICD-10-CM

## 2021-03-05 DIAGNOSIS — Z452 Encounter for adjustment and management of vascular access device: Secondary | ICD-10-CM | POA: Diagnosis not present

## 2021-03-05 DIAGNOSIS — Z4682 Encounter for fitting and adjustment of non-vascular catheter: Secondary | ICD-10-CM | POA: Diagnosis not present

## 2021-03-05 DIAGNOSIS — K632 Fistula of intestine: Secondary | ICD-10-CM | POA: Diagnosis not present

## 2021-03-05 DIAGNOSIS — J9811 Atelectasis: Secondary | ICD-10-CM | POA: Diagnosis not present

## 2021-03-05 DIAGNOSIS — K2091 Esophagitis, unspecified with bleeding: Secondary | ICD-10-CM | POA: Diagnosis not present

## 2021-03-05 DIAGNOSIS — R739 Hyperglycemia, unspecified: Secondary | ICD-10-CM

## 2021-03-05 DIAGNOSIS — K7689 Other specified diseases of liver: Secondary | ICD-10-CM | POA: Diagnosis not present

## 2021-03-05 DIAGNOSIS — N281 Cyst of kidney, acquired: Secondary | ICD-10-CM | POA: Diagnosis not present

## 2021-03-05 DIAGNOSIS — R062 Wheezing: Secondary | ICD-10-CM

## 2021-03-05 DIAGNOSIS — N1330 Unspecified hydronephrosis: Secondary | ICD-10-CM | POA: Diagnosis not present

## 2021-03-05 DIAGNOSIS — D649 Anemia, unspecified: Secondary | ICD-10-CM | POA: Diagnosis not present

## 2021-03-05 DIAGNOSIS — R609 Edema, unspecified: Secondary | ICD-10-CM | POA: Diagnosis not present

## 2021-03-05 DIAGNOSIS — R0902 Hypoxemia: Secondary | ICD-10-CM

## 2021-03-05 DIAGNOSIS — T8143XA Infection following a procedure, organ and space surgical site, initial encounter: Secondary | ICD-10-CM | POA: Diagnosis not present

## 2021-03-05 DIAGNOSIS — M5030 Other cervical disc degeneration, unspecified cervical region: Secondary | ICD-10-CM | POA: Diagnosis present

## 2021-03-05 DIAGNOSIS — Z6824 Body mass index (BMI) 24.0-24.9, adult: Secondary | ICD-10-CM

## 2021-03-05 DIAGNOSIS — Z933 Colostomy status: Secondary | ICD-10-CM

## 2021-03-05 DIAGNOSIS — Z95828 Presence of other vascular implants and grafts: Secondary | ICD-10-CM

## 2021-03-05 DIAGNOSIS — R040 Epistaxis: Secondary | ICD-10-CM | POA: Diagnosis not present

## 2021-03-05 DIAGNOSIS — Z8249 Family history of ischemic heart disease and other diseases of the circulatory system: Secondary | ICD-10-CM

## 2021-03-05 DIAGNOSIS — Z823 Family history of stroke: Secondary | ICD-10-CM

## 2021-03-05 DIAGNOSIS — T451X5A Adverse effect of antineoplastic and immunosuppressive drugs, initial encounter: Secondary | ICD-10-CM | POA: Diagnosis present

## 2021-03-05 DIAGNOSIS — J9601 Acute respiratory failure with hypoxia: Secondary | ICD-10-CM | POA: Diagnosis not present

## 2021-03-05 DIAGNOSIS — Z85048 Personal history of other malignant neoplasm of rectum, rectosigmoid junction, and anus: Secondary | ICD-10-CM | POA: Diagnosis present

## 2021-03-05 DIAGNOSIS — Z419 Encounter for procedure for purposes other than remedying health state, unspecified: Secondary | ICD-10-CM

## 2021-03-05 DIAGNOSIS — R601 Generalized edema: Secondary | ICD-10-CM | POA: Diagnosis present

## 2021-03-05 DIAGNOSIS — R0682 Tachypnea, not elsewhere classified: Secondary | ICD-10-CM

## 2021-03-05 DIAGNOSIS — K9189 Other postprocedural complications and disorders of digestive system: Secondary | ICD-10-CM | POA: Diagnosis not present

## 2021-03-05 DIAGNOSIS — M170 Bilateral primary osteoarthritis of knee: Secondary | ICD-10-CM | POA: Diagnosis not present

## 2021-03-05 DIAGNOSIS — G9340 Encephalopathy, unspecified: Secondary | ICD-10-CM | POA: Diagnosis not present

## 2021-03-05 DIAGNOSIS — Z923 Personal history of irradiation: Secondary | ICD-10-CM

## 2021-03-05 DIAGNOSIS — Z09 Encounter for follow-up examination after completed treatment for conditions other than malignant neoplasm: Secondary | ICD-10-CM

## 2021-03-05 DIAGNOSIS — R54 Age-related physical debility: Secondary | ICD-10-CM | POA: Diagnosis present

## 2021-03-05 DIAGNOSIS — A419 Sepsis, unspecified organism: Secondary | ICD-10-CM | POA: Diagnosis not present

## 2021-03-05 DIAGNOSIS — R0602 Shortness of breath: Secondary | ICD-10-CM | POA: Diagnosis not present

## 2021-03-05 DIAGNOSIS — K648 Other hemorrhoids: Secondary | ICD-10-CM | POA: Diagnosis present

## 2021-03-05 DIAGNOSIS — Z87891 Personal history of nicotine dependence: Secondary | ICD-10-CM

## 2021-03-05 DIAGNOSIS — N432 Other hydrocele: Secondary | ICD-10-CM | POA: Diagnosis present

## 2021-03-05 HISTORY — PX: XI ROBOTIC ASSISTED LOWER ANTERIOR RESECTION: SHX6558

## 2021-03-05 HISTORY — PX: OSTOMY: SHX5997

## 2021-03-05 LAB — PREPARE RBC (CROSSMATCH)

## 2021-03-05 LAB — ABO/RH: ABO/RH(D): O POS

## 2021-03-05 SURGERY — RESECTION, RECTUM, LOW ANTERIOR, ROBOT-ASSISTED
Anesthesia: General | Site: Abdomen

## 2021-03-05 MED ORDER — FENTANYL CITRATE (PF) 100 MCG/2ML IJ SOLN
INTRAMUSCULAR | Status: DC | PRN
  Administered 2021-03-05: 50 ug via INTRAVENOUS
  Administered 2021-03-05: 100 ug via INTRAVENOUS
  Administered 2021-03-05: 25 ug via INTRAVENOUS
  Administered 2021-03-05: 100 ug via INTRAVENOUS
  Administered 2021-03-05: 25 ug via INTRAVENOUS
  Administered 2021-03-05 (×2): 50 ug via INTRAVENOUS

## 2021-03-05 MED ORDER — DEXAMETHASONE SODIUM PHOSPHATE 10 MG/ML IJ SOLN
INTRAMUSCULAR | Status: DC | PRN
  Administered 2021-03-05: 8 mg via INTRAVENOUS

## 2021-03-05 MED ORDER — LIDOCAINE 2% (20 MG/ML) 5 ML SYRINGE
INTRAMUSCULAR | Status: DC | PRN
  Administered 2021-03-05: 80 mg via INTRAVENOUS
  Administered 2021-03-05: 1.5 mg/kg/h via INTRAVENOUS

## 2021-03-05 MED ORDER — FENTANYL CITRATE (PF) 100 MCG/2ML IJ SOLN
INTRAMUSCULAR | Status: AC
  Filled 2021-03-05: qty 2

## 2021-03-05 MED ORDER — HYDROMORPHONE HCL 1 MG/ML IJ SOLN
INTRAMUSCULAR | Status: AC
  Administered 2021-03-05: 1 mg
  Filled 2021-03-05: qty 1

## 2021-03-05 MED ORDER — INDOCYANINE GREEN 25 MG IV SOLR
INTRAVENOUS | Status: DC | PRN
  Administered 2021-03-05: 7.5 mg via INTRAVENOUS

## 2021-03-05 MED ORDER — ONDANSETRON HCL 4 MG/2ML IJ SOLN
INTRAMUSCULAR | Status: DC | PRN
  Administered 2021-03-05: 4 mg via INTRAVENOUS

## 2021-03-05 MED ORDER — ALBUMIN HUMAN 5 % IV SOLN: INTRAVENOUS | Status: DC | PRN

## 2021-03-05 MED ORDER — FENTANYL CITRATE (PF) 250 MCG/5ML IJ SOLN
INTRAMUSCULAR | Status: AC
  Filled 2021-03-05: qty 5

## 2021-03-05 MED ORDER — DEXMEDETOMIDINE (PRECEDEX) IN NS 20 MCG/5ML (4 MCG/ML) IV SYRINGE
PREFILLED_SYRINGE | INTRAVENOUS | Status: DC | PRN
  Administered 2021-03-05: 8 ug via INTRAVENOUS
  Administered 2021-03-05: 4 ug via INTRAVENOUS
  Administered 2021-03-05: 8 ug via INTRAVENOUS

## 2021-03-05 MED ORDER — SODIUM CHLORIDE 0.9 % IV SOLN
2.0000 g | Freq: Two times a day (BID) | INTRAVENOUS | Status: AC
  Administered 2021-03-05: 2 g via INTRAVENOUS
  Filled 2021-03-05: qty 2

## 2021-03-05 MED ORDER — ONDANSETRON HCL 4 MG/2ML IJ SOLN
INTRAMUSCULAR | Status: AC
  Filled 2021-03-05: qty 2

## 2021-03-05 MED ORDER — SIMETHICONE 80 MG PO CHEW
40.0000 mg | CHEWABLE_TABLET | Freq: Four times a day (QID) | ORAL | Status: DC | PRN
  Administered 2021-03-05 – 2021-03-29 (×4): 40 mg via ORAL
  Filled 2021-03-05 (×6): qty 1

## 2021-03-05 MED ORDER — DIPHENHYDRAMINE HCL 50 MG/ML IJ SOLN: 12.5000 mg | Freq: Four times a day (QID) | INTRAMUSCULAR | Status: DC | PRN

## 2021-03-05 MED ORDER — LIDOCAINE 2% (20 MG/ML) 5 ML SYRINGE
INTRAMUSCULAR | Status: AC
  Filled 2021-03-05: qty 5

## 2021-03-05 MED ORDER — CHLORHEXIDINE GLUCONATE 0.12 % MT SOLN
15.0000 mL | Freq: Once | OROMUCOSAL | Status: AC
  Administered 2021-03-05: 15 mL via OROMUCOSAL

## 2021-03-05 MED ORDER — ACETAMINOPHEN 500 MG PO TABS
1000.0000 mg | ORAL_TABLET | Freq: Four times a day (QID) | ORAL | Status: DC
  Administered 2021-03-05 – 2021-03-06 (×2): 1000 mg via ORAL
  Filled 2021-03-05 (×2): qty 2

## 2021-03-05 MED ORDER — ENSURE PRE-SURGERY PO LIQD
296.0000 mL | Freq: Once | ORAL | Status: DC
  Filled 2021-03-05: qty 296

## 2021-03-05 MED ORDER — ALBUMIN HUMAN 5 % IV SOLN
INTRAVENOUS | Status: AC
  Filled 2021-03-05: qty 250

## 2021-03-05 MED ORDER — HYDROMORPHONE HCL 1 MG/ML IJ SOLN
0.2500 mg | INTRAMUSCULAR | Status: DC | PRN
  Administered 2021-03-05 (×3): 0.5 mg via INTRAVENOUS

## 2021-03-05 MED ORDER — PHENYLEPHRINE 40 MCG/ML (10ML) SYRINGE FOR IV PUSH (FOR BLOOD PRESSURE SUPPORT)
PREFILLED_SYRINGE | INTRAVENOUS | Status: AC
  Filled 2021-03-05: qty 10

## 2021-03-05 MED ORDER — ALVIMOPAN 12 MG PO CAPS
12.0000 mg | ORAL_CAPSULE | ORAL | Status: AC
  Administered 2021-03-05: 12 mg via ORAL
  Filled 2021-03-05: qty 1

## 2021-03-05 MED ORDER — METHYLENE BLUE 0.5 % INJ SOLN
INTRAVENOUS | Status: AC
  Filled 2021-03-05: qty 10

## 2021-03-05 MED ORDER — ROCURONIUM BROMIDE 10 MG/ML (PF) SYRINGE
PREFILLED_SYRINGE | INTRAVENOUS | Status: AC
  Filled 2021-03-05: qty 20

## 2021-03-05 MED ORDER — ALUM & MAG HYDROXIDE-SIMETH 200-200-20 MG/5ML PO SUSP: 30.0000 mL | Freq: Four times a day (QID) | ORAL | Status: DC | PRN

## 2021-03-05 MED ORDER — BUPIVACAINE LIPOSOME 1.3 % IJ SUSP
INTRAMUSCULAR | Status: DC | PRN
  Administered 2021-03-05: 20 mL

## 2021-03-05 MED ORDER — ENOXAPARIN SODIUM 40 MG/0.4ML ~~LOC~~ SOLN
40.0000 mg | SUBCUTANEOUS | Status: DC
  Administered 2021-03-06 – 2021-03-07 (×2): 40 mg via SUBCUTANEOUS
  Filled 2021-03-05 (×2): qty 0.4

## 2021-03-05 MED ORDER — ORAL CARE MOUTH RINSE: 15.0000 mL | Freq: Once | OROMUCOSAL | Status: AC

## 2021-03-05 MED ORDER — ROCURONIUM BROMIDE 10 MG/ML (PF) SYRINGE
PREFILLED_SYRINGE | INTRAVENOUS | Status: DC | PRN
  Administered 2021-03-05: 10 mg via INTRAVENOUS
  Administered 2021-03-05: 15 mg via INTRAVENOUS
  Administered 2021-03-05 (×6): 20 mg via INTRAVENOUS
  Administered 2021-03-05: 15 mg via INTRAVENOUS
  Administered 2021-03-05: 20 mg via INTRAVENOUS
  Administered 2021-03-05: 60 mg via INTRAVENOUS

## 2021-03-05 MED ORDER — SODIUM CHLORIDE 0.9 % IR SOLN
Status: DC | PRN
  Administered 2021-03-05: 3000 mL

## 2021-03-05 MED ORDER — LACTATED RINGERS IV SOLN: INTRAVENOUS | Status: DC | PRN

## 2021-03-05 MED ORDER — KCL IN DEXTROSE-NACL 20-5-0.45 MEQ/L-%-% IV SOLN
INTRAVENOUS | Status: DC
  Filled 2021-03-05: qty 1000

## 2021-03-05 MED ORDER — HYDROMORPHONE HCL 1 MG/ML IJ SOLN
INTRAMUSCULAR | Status: AC
  Administered 2021-03-05: 0.5 mg via INTRAVENOUS
  Filled 2021-03-05: qty 2

## 2021-03-05 MED ORDER — BUPIVACAINE-EPINEPHRINE (PF) 0.25% -1:200000 IJ SOLN
INTRAMUSCULAR | Status: AC
  Filled 2021-03-05: qty 30

## 2021-03-05 MED ORDER — MIDAZOLAM HCL 2 MG/2ML IJ SOLN
INTRAMUSCULAR | Status: AC
  Filled 2021-03-05: qty 2

## 2021-03-05 MED ORDER — DROPERIDOL 2.5 MG/ML IJ SOLN: 0.6250 mg | Freq: Once | INTRAMUSCULAR | Status: DC | PRN

## 2021-03-05 MED ORDER — PHENYLEPHRINE 40 MCG/ML (10ML) SYRINGE FOR IV PUSH (FOR BLOOD PRESSURE SUPPORT)
PREFILLED_SYRINGE | INTRAVENOUS | Status: DC | PRN
  Administered 2021-03-05: 120 ug via INTRAVENOUS

## 2021-03-05 MED ORDER — ROCURONIUM BROMIDE 10 MG/ML (PF) SYRINGE
PREFILLED_SYRINGE | INTRAVENOUS | Status: AC
  Filled 2021-03-05: qty 10

## 2021-03-05 MED ORDER — OXYCODONE HCL 5 MG PO TABS
5.0000 mg | ORAL_TABLET | ORAL | Status: DC | PRN
  Administered 2021-03-05: 10 mg via ORAL
  Administered 2021-03-06 (×2): 5 mg via ORAL
  Filled 2021-03-05: qty 1
  Filled 2021-03-05 (×2): qty 2

## 2021-03-05 MED ORDER — SODIUM CHLORIDE 0.9 % IV SOLN
2.0000 g | INTRAVENOUS | Status: AC
  Administered 2021-03-05: 2 g via INTRAVENOUS
  Filled 2021-03-05: qty 2

## 2021-03-05 MED ORDER — ONDANSETRON HCL 4 MG PO TABS: 4.0000 mg | ORAL_TABLET | Freq: Four times a day (QID) | ORAL | Status: DC | PRN

## 2021-03-05 MED ORDER — GABAPENTIN 300 MG PO CAPS
300.0000 mg | ORAL_CAPSULE | Freq: Two times a day (BID) | ORAL | Status: DC
  Administered 2021-03-05 – 2021-03-07 (×4): 300 mg via ORAL
  Filled 2021-03-05 (×4): qty 1

## 2021-03-05 MED ORDER — SUGAMMADEX SODIUM 200 MG/2ML IV SOLN
INTRAVENOUS | Status: DC | PRN
  Administered 2021-03-05: 200 mg via INTRAVENOUS

## 2021-03-05 MED ORDER — LACTATED RINGERS IR SOLN
Status: DC | PRN
  Administered 2021-03-05: 3000 mL

## 2021-03-05 MED ORDER — ACETAMINOPHEN 500 MG PO TABS
1000.0000 mg | ORAL_TABLET | ORAL | Status: AC
  Administered 2021-03-05: 1000 mg via ORAL
  Filled 2021-03-05: qty 2

## 2021-03-05 MED ORDER — PHENYLEPHRINE HCL-NACL 10-0.9 MG/250ML-% IV SOLN
INTRAVENOUS | Status: DC | PRN
  Administered 2021-03-05: 35 ug/min via INTRAVENOUS

## 2021-03-05 MED ORDER — ALVIMOPAN 12 MG PO CAPS
12.0000 mg | ORAL_CAPSULE | Freq: Two times a day (BID) | ORAL | Status: DC
  Filled 2021-03-05 (×2): qty 1

## 2021-03-05 MED ORDER — BUPIVACAINE-EPINEPHRINE 0.25% -1:200000 IJ SOLN
INTRAMUSCULAR | Status: DC | PRN
  Administered 2021-03-05: 30 mL

## 2021-03-05 MED ORDER — HEPARIN SODIUM (PORCINE) 5000 UNIT/ML IJ SOLN
5000.0000 [IU] | Freq: Once | INTRAMUSCULAR | Status: AC
  Administered 2021-03-05: 5000 [IU] via SUBCUTANEOUS
  Filled 2021-03-05: qty 1

## 2021-03-05 MED ORDER — BACITRACIN 500 UNIT/GM EX OINT
TOPICAL_OINTMENT | Freq: Two times a day (BID) | CUTANEOUS | Status: DC
  Administered 2021-03-09 – 2021-04-10 (×9): 1 via TOPICAL
  Filled 2021-03-05 (×4): qty 14

## 2021-03-05 MED ORDER — HYDROMORPHONE HCL 1 MG/ML IJ SOLN
0.5000 mg | INTRAMUSCULAR | Status: DC | PRN
Start: 2021-03-05 — End: 2021-03-05
  Administered 2021-03-05: 0.5 mg via INTRAVENOUS
  Filled 2021-03-05: qty 0.5

## 2021-03-05 MED ORDER — ENSURE PRE-SURGERY PO LIQD
592.0000 mL | Freq: Once | ORAL | Status: DC
  Filled 2021-03-05: qty 592

## 2021-03-05 MED ORDER — SACCHAROMYCES BOULARDII 250 MG PO CAPS
250.0000 mg | ORAL_CAPSULE | Freq: Two times a day (BID) | ORAL | Status: DC
  Administered 2021-03-05 – 2021-03-07 (×4): 250 mg via ORAL
  Filled 2021-03-05 (×4): qty 1

## 2021-03-05 MED ORDER — LACTATED RINGERS IV SOLN: INTRAVENOUS | Status: DC

## 2021-03-05 MED ORDER — GABAPENTIN 300 MG PO CAPS
300.0000 mg | ORAL_CAPSULE | ORAL | Status: AC
  Administered 2021-03-05: 300 mg via ORAL
  Filled 2021-03-05: qty 1

## 2021-03-05 MED ORDER — ONDANSETRON HCL 4 MG/2ML IJ SOLN
4.0000 mg | Freq: Four times a day (QID) | INTRAMUSCULAR | Status: DC | PRN
  Administered 2021-03-10 – 2021-04-01 (×5): 4 mg via INTRAVENOUS
  Filled 2021-03-05 (×9): qty 2

## 2021-03-05 MED ORDER — PROPOFOL 10 MG/ML IV BOLUS
INTRAVENOUS | Status: DC | PRN
  Administered 2021-03-05: 140 mg via INTRAVENOUS

## 2021-03-05 MED ORDER — DIPHENHYDRAMINE HCL 12.5 MG/5ML PO ELIX: 12.5000 mg | ORAL_SOLUTION | Freq: Four times a day (QID) | ORAL | Status: DC | PRN

## 2021-03-05 MED ORDER — 0.9 % SODIUM CHLORIDE (POUR BTL) OPTIME
TOPICAL | Status: DC | PRN
  Administered 2021-03-05: 12000 mL

## 2021-03-05 MED ORDER — DEXAMETHASONE SODIUM PHOSPHATE 10 MG/ML IJ SOLN
INTRAMUSCULAR | Status: AC
  Filled 2021-03-05: qty 1

## 2021-03-05 MED ORDER — PROPOFOL 10 MG/ML IV BOLUS
INTRAVENOUS | Status: AC
  Filled 2021-03-05: qty 40

## 2021-03-05 MED ORDER — MIDAZOLAM HCL 5 MG/5ML IJ SOLN
INTRAMUSCULAR | Status: DC | PRN
  Administered 2021-03-05: 2 mg via INTRAVENOUS

## 2021-03-05 MED ORDER — ENSURE SURGERY PO LIQD
237.0000 mL | Freq: Two times a day (BID) | ORAL | Status: DC
  Administered 2021-03-07 – 2021-03-08 (×3): 237 mL via ORAL
  Filled 2021-03-05 (×4): qty 237

## 2021-03-05 MED ORDER — HYDROMORPHONE HCL 1 MG/ML IJ SOLN
0.5000 mg | INTRAMUSCULAR | Status: DC | PRN
  Administered 2021-03-05 – 2021-03-06 (×2): 1 mg via INTRAVENOUS
  Administered 2021-03-06 – 2021-03-07 (×3): 0.5 mg via INTRAVENOUS
  Filled 2021-03-05 (×5): qty 1

## 2021-03-05 SURGICAL SUPPLY — 87 items
BLADE EXTENDED COATED 6.5IN (ELECTRODE) IMPLANT
CANNULA REDUC XI 12-8 STAPL (CANNULA)
CANNULA REDUCER 12-8 DVNC XI (CANNULA) IMPLANT
CELLS DAT CNTRL 66122 CELL SVR (MISCELLANEOUS) IMPLANT
COVER SURGICAL LIGHT HANDLE (MISCELLANEOUS) ×6 IMPLANT
COVER TIP SHEARS 8 DVNC (MISCELLANEOUS) ×2 IMPLANT
COVER TIP SHEARS 8MM DA VINCI (MISCELLANEOUS) ×1
COVER WAND RF STERILE (DRAPES) ×3 IMPLANT
DECANTER SPIKE VIAL GLASS SM (MISCELLANEOUS) IMPLANT
DRAIN CHANNEL 19F RND (DRAIN) IMPLANT
DRAPE ARM DVNC X/XI (DISPOSABLE) ×8 IMPLANT
DRAPE COLUMN DVNC XI (DISPOSABLE) ×2 IMPLANT
DRAPE DA VINCI XI ARM (DISPOSABLE) ×4
DRAPE DA VINCI XI COLUMN (DISPOSABLE) ×1
DRAPE SURG IRRIG POUCH 19X23 (DRAPES) ×3 IMPLANT
DRSG OPSITE POSTOP 3X4 (GAUZE/BANDAGES/DRESSINGS) ×3 IMPLANT
DRSG OPSITE POSTOP 4X10 (GAUZE/BANDAGES/DRESSINGS) IMPLANT
DRSG OPSITE POSTOP 4X6 (GAUZE/BANDAGES/DRESSINGS) IMPLANT
DRSG OPSITE POSTOP 4X8 (GAUZE/BANDAGES/DRESSINGS) IMPLANT
ELECT PENCIL ROCKER SW 15FT (MISCELLANEOUS) ×3 IMPLANT
ELECT REM PT RETURN 15FT ADLT (MISCELLANEOUS) ×3 IMPLANT
ENDOLOOP SUT PDS II  0 18 (SUTURE)
ENDOLOOP SUT PDS II 0 18 (SUTURE) IMPLANT
EVACUATOR SILICONE 100CC (DRAIN) IMPLANT
GLOVE SURG ENC MOIS LTX SZ6.5 (GLOVE) ×9 IMPLANT
GLOVE SURG UNDER POLY LF SZ7 (GLOVE) ×6 IMPLANT
GOWN STRL REUS W/TWL XL LVL3 (GOWN DISPOSABLE) ×9 IMPLANT
GRASPER SUT TROCAR 14GX15 (MISCELLANEOUS) ×3 IMPLANT
HOLDER FOLEY CATH W/STRAP (MISCELLANEOUS) ×3 IMPLANT
IRRIG SUCT STRYKERFLOW 2 WTIP (MISCELLANEOUS) ×3
IRRIGATION SUCT STRKRFLW 2 WTP (MISCELLANEOUS) ×2 IMPLANT
IRRIGATOR SUCT 8 DISP DVNC XI (IRRIGATION / IRRIGATOR) ×2 IMPLANT
IRRIGATOR SUCTION 8MM XI DISP (IRRIGATION / IRRIGATOR) ×1
KIT PROCEDURE DA VINCI SI (MISCELLANEOUS) ×1
KIT PROCEDURE DVNC SI (MISCELLANEOUS) ×2 IMPLANT
KIT TURNOVER KIT A (KITS) ×3 IMPLANT
NEEDLE INSUFFLATION 14GA 120MM (NEEDLE) ×3 IMPLANT
PACK CARDIOVASCULAR III (CUSTOM PROCEDURE TRAY) ×3 IMPLANT
PACK COLON (CUSTOM PROCEDURE TRAY) ×3 IMPLANT
PAD POSITIONING PINK XL (MISCELLANEOUS) ×3 IMPLANT
PORT LAP GEL ALEXIS MED 5-9CM (MISCELLANEOUS) IMPLANT
RELOAD STAPLER 3.5X60 BLU DVNC (STAPLE) IMPLANT
RELOAD STAPLER 4.3X60 GRN DVNC (STAPLE) ×2 IMPLANT
RTRCTR WOUND ALEXIS 18CM MED (MISCELLANEOUS)
SCISSORS LAP 5X35 DISP (ENDOMECHANICALS) ×3 IMPLANT
SEAL CANN UNIV 5-8 DVNC XI (MISCELLANEOUS) ×10 IMPLANT
SEAL XI 5MM-8MM UNIVERSAL (MISCELLANEOUS) ×5
SEALER VESSEL DA VINCI XI (MISCELLANEOUS) ×1
SEALER VESSEL EXT DVNC XI (MISCELLANEOUS) ×2 IMPLANT
SOLUTION ELECTROLUBE (MISCELLANEOUS) ×3 IMPLANT
STAPLER 60 DA VINCI SURE FORM (STAPLE) ×1
STAPLER 60 SUREFORM DVNC (STAPLE) ×2 IMPLANT
STAPLER CANNULA SEAL DVNC XI (STAPLE) IMPLANT
STAPLER CANNULA SEAL XI (STAPLE)
STAPLER ECHELON POWER CIR 29 (STAPLE) IMPLANT
STAPLER ECHELON POWER CIR 31 (STAPLE) IMPLANT
STAPLER RELOAD 3.5X60 BLU DVNC (STAPLE)
STAPLER RELOAD 3.5X60 BLUE (STAPLE)
STAPLER RELOAD 4.3X60 GREEN (STAPLE) ×1
STAPLER RELOAD 4.3X60 GRN DVNC (STAPLE) ×2
STOPCOCK 4 WAY LG BORE MALE ST (IV SETS) ×9 IMPLANT
SUT ETHILON 2 0 PS N (SUTURE) IMPLANT
SUT NOVA NAB DX-16 0-1 5-0 T12 (SUTURE) ×6 IMPLANT
SUT PROLENE 2 0 KS (SUTURE) IMPLANT
SUT SILK 2 0 (SUTURE) ×1
SUT SILK 2 0 SH CR/8 (SUTURE) IMPLANT
SUT SILK 2-0 18XBRD TIE 12 (SUTURE) ×2 IMPLANT
SUT SILK 3 0 (SUTURE)
SUT SILK 3 0 SH CR/8 (SUTURE) ×3 IMPLANT
SUT SILK 3-0 18XBRD TIE 12 (SUTURE) IMPLANT
SUT V-LOC BARB 180 2/0GR6 GS22 (SUTURE) ×6
SUT VIC AB 2-0 SH 18 (SUTURE) IMPLANT
SUT VIC AB 2-0 SH 27 (SUTURE)
SUT VIC AB 2-0 SH 27X BRD (SUTURE) IMPLANT
SUT VIC AB 3-0 SH 18 (SUTURE) IMPLANT
SUT VIC AB 4-0 PS2 27 (SUTURE) ×6 IMPLANT
SUT VICRYL 0 UR6 27IN ABS (SUTURE) ×3 IMPLANT
SUTURE V-LC BRB 180 2/0GR6GS22 (SUTURE) ×4 IMPLANT
SYR 10ML ECCENTRIC (SYRINGE) ×3 IMPLANT
SYS LAPSCP GELPORT 120MM (MISCELLANEOUS)
SYSTEM LAPSCP GELPORT 120MM (MISCELLANEOUS) IMPLANT
TOWEL OR 17X26 10 PK STRL BLUE (TOWEL DISPOSABLE) IMPLANT
TOWEL OR NON WOVEN STRL DISP B (DISPOSABLE) ×3 IMPLANT
TRAY FOLEY MTR SLVR 16FR STAT (SET/KITS/TRAYS/PACK) ×3 IMPLANT
TROCAR ADV FIXATION 5X100MM (TROCAR) ×3 IMPLANT
TUBING CONNECTING 10 (TUBING) ×6 IMPLANT
TUBING INSUFFLATION 10FT LAP (TUBING) ×3 IMPLANT

## 2021-03-05 NOTE — Op Note (Signed)
Preoperative diagnosis: Unable to insert catheter due to tight foreskin Postoperative diagnosis: Unable to pass catheter due to tight foreskin Surgery: Examination under anesthesia; spreading of phimosis with hemostat; insertion of catheter Surgeon: Dr. Nicki Reaper Mayan Kloepfer  Patient has the above diagnosis.  He is under anesthesia.  They cannot insert Foley catheter.  He had mild phimosis and I could just see the tip of the glans.  Sterile technique was utilized with good preparation the genitalia.  With a hemostat I gently dilated the modest ring of the foreskin distally.  I could then see the urethral meatus well.  Foley catheter prepared by surgery team was easily inserted into the bladder with clear drainage of urine.  He had mild superficial splitting of the skin dorsally at 12:00.  Bacitracin Polysporin ointment twice a day for the next week or 2 and see as needed recommended.

## 2021-03-05 NOTE — H&P (Signed)
The patient is a 70 year old male who presents with colorectal cancer. 70 year old male who presented to the office for evaluation of a newly diagnosed rectal cancer last fall. He presented to the ED with complaints of hemorrhoids and rectal bleeding. Some lab work was performed, which showed anemia. CT showed a large rectal mass. He was seen by Dr. Ammie Dalton and a colonoscopy was obtained. This showed a rectal mass communicating with an adjacent loop of distal sigmoid colon. MRI was performed. This showed invasion of the tumor into the pelvic sidewall and right seminal vesicle. I have reviewed this set of images myself and would agree with right lateral sidewall invasion and possible invasion of the right seminal vesicle. There were no lymph nodes noted (T4N0). CT chest shows multiple small pulmonary nodules in the left pulmonary apex, which are nonspecific and indeterminate for metastatic disease. Patient is s/p ChemoRT currently having bowel function with the assistance of MiraLAX. His last treatment was 2/25.    Problem List/Past Medical Leighton Ruff, MD; 04/11/1307 10:03 AM) RECTAL CANCER (C20)  Past Surgical History Leighton Ruff, MD; 04/13/7845 10:03 AM) No pertinent past surgical history  Diagnostic Studies History Leighton Ruff, MD; 07/16/2951 10:03 AM) Colonoscopy within last year  Allergies No Known Allergies [07/22/2020]: Allergies Reconciled No Known Drug Allergies [01/13/2021]:  Medication History  Hydro-Crysti-12 (1000MCG/ML Solution, Injection) Active. Dipentum (250MG  Capsule, Oral) Active. Capecitabine (150MG  Tablet, Oral) Active. Ondansetron (4MG  Film, Oral) Active. Pravastatin Sodium (20MG  Tablet, Oral) Active. MiraLax (17GM Packet, Oral) Active. Colace (100MG  Capsule, Oral) Active. Voltaren (75MG  Tablet DR, Oral) Active. Anusol-HC (2.5% Cream, External) Active.  Other Problems  Arthritis     Review of Systems  General Not  Present- Appetite Loss, Chills, Fatigue, Fever, Night Sweats, Weight Gain and Weight Loss. Skin Not Present- Change in Wart/Mole, Dryness, Hives, Jaundice, New Lesions, Non-Healing Wounds, Rash and Ulcer. HEENT Not Present- Earache, Hearing Loss, Hoarseness, Nose Bleed, Oral Ulcers, Ringing in the Ears, Seasonal Allergies, Sinus Pain, Sore Throat, Visual Disturbances, Wears glasses/contact lenses and Yellow Eyes. Respiratory Not Present- Bloody sputum, Chronic Cough, Difficulty Breathing, Snoring and Wheezing. Breast Not Present- Breast Mass, Breast Pain, Nipple Discharge and Skin Changes. Cardiovascular Not Present- Chest Pain, Difficulty Breathing Lying Down, Leg Cramps, Palpitations, Rapid Heart Rate, Shortness of Breath and Swelling of Extremities. Gastrointestinal Present- Rectal Pain. Not Present- Abdominal Pain, Bloating, Bloody Stool, Change in Bowel Habits, Chronic diarrhea, Constipation, Difficulty Swallowing, Excessive gas, Gets full quickly at meals, Hemorrhoids, Indigestion, Nausea and Vomiting. Male Genitourinary Not Present- Blood in Urine, Change in Urinary Stream, Frequency, Impotence, Nocturia, Painful Urination, Urgency and Urine Leakage. Musculoskeletal Not Present- Back Pain, Joint Pain, Joint Stiffness, Muscle Pain, Muscle Weakness and Swelling of Extremities. Neurological Not Present- Decreased Memory, Fainting, Headaches, Numbness, Seizures, Tingling, Tremor, Trouble walking and Weakness. Psychiatric Not Present- Anxiety, Bipolar, Change in Sleep Pattern, Depression, Fearful and Frequent crying. Endocrine Not Present- Cold Intolerance, Excessive Hunger, Hair Changes, Heat Intolerance and New Diabetes. Hematology Not Present- Blood Thinners, Easy Bruising, Excessive bleeding, Gland problems, HIV and Persistent Infections.   Physical Exam   General Mental Status-Alert. General Appearance-Cooperative.  Abdomen Palpation/Percussion Palpation and Percussion of the  abdomen reveal - Soft and Non Tender.    Assessment & Plan   RECTAL CANCER (C20) Impression: 70 year old male with advanced stage rectal cancer invading his sigmoid colon and possible invasion of seminal vesicles. Status post chemotherapy and radiation. He is recovering well from this. MRI was performed post procedure to evaluate his local disease  for regression after radiation. It appears he may have some sigmoid involvement still but everything else seems to have regressed.  We have discussed in detail. We have discussed the possible risk of needing a permanent colostomy due to lack of colon to reach into the pelvis. I believe he understands this and is willing to proceed. The surgery and anatomy were described to the patient as well as the risks of surgery and the possible complications. These include: Bleeding, deep abdominal infections and possible wound complications such as hernia and infection, damage to adjacent structures, leak of surgical connections, which can lead to other surgeries and possibly an ostomy, possible need for other procedures, such as abscess drains in radiology, possible prolonged hospital stay, possible diarrhea from removal of part of the colon, possible constipation from narcotics, possible bowel, bladder or sexual dysfunction if having rectal surgery, prolonged fatigue/weakness or appetite loss, possible early recurrence of of disease, possible complications of their medical problems such as heart disease or arrhythmias or lung problems, death (less than 1%). I believe the patient understands and wishes to proceed with the surgery.

## 2021-03-05 NOTE — Op Note (Signed)
03/05/2021  4:33 PM  PATIENT:  Shane [name]  70 y.o. male  Patient Care Team: Marrian Salvage, [place] as PCP - General (Internal Medicine) Ladell Pier, MD as Consulting Physician (Oncology) Jonnie Finner, RN as Oncology Nurse Navigator Heilingoetter, Tobe Sos, PA-C as Physician Assistant (Physician Assistant)  PRE-OPERATIVE DIAGNOSIS:  RECTAL CANCER  POST-OPERATIVE DIAGNOSIS:  RECTAL CANCER  PROCEDURE:   XI ROBOTIC ASSISTED LOWER ANTERIOR RESECTION WITH MOBILIZATION OF THE SPLENIC FLEXURE  AND END COLOSTOMY    Surgeon(s): Leighton Ruff, MD Michael Boston, MD  ASSISTANT: Dr Johney Maine   ANESTHESIA:   local and general  EBL: 364ml  Total I/O In: 4000 [I.V.:3000; IV Piggyback:1000] Out: 750 [Urine:400; Blood:350]  Delay start of Pharmacological VTE agent (>24hrs) due to surgical blood loss or risk of bleeding:  no  DRAINS: (67F) Jackson-Pratt drain(s) with closed bulb suction in the pelvis   SPECIMEN:  Source of Specimen:  Rectum, sigmoid.  Additional descending colon  DISPOSITION OF SPECIMEN:  PATHOLOGY  COUNTS:  YES  PLAN OF CARE: Admit to inpatient   PATIENT DISPOSITION:  PACU - hemodynamically stable.  INDICATION:    70 y.o. male with a rectal tumor invading the distal sigmoid.  I recommended low anterior resection:  The anatomy & physiology of the digestive tract was discussed.  The pathophysiology was discussed.  Natural history risks without surgery was discussed.   I worked to give an overview of the disease and the frequent need to have multispecialty involvement.  I feel the risks of no intervention will lead to serious problems that outweigh the operative risks; therefore, I recommended a partial colectomy to remove the pathology.  Laparoscopic & open techniques were discussed.   Risks such as bleeding, infection, abscess, leak, reoperation, possible ostomy, hernia, heart attack, death, and other risks were discussed.  I noted a good likelihood  this will help address the problem.   Goals of post-operative recovery were discussed as well.    The patient expressed understanding & wished to proceed with surgery.  OR FINDINGS:   Patient had loop of sigmoid that was adjacent to a rectal tumor  No obvious metastatic disease on visceral parietal peritoneum or liver.    DESCRIPTION:   Informed consent was confirmed.  The patient underwent general anaesthesia without difficulty.  The patient was positioned appropriately.  VTE prevention in place.  The patient's abdomen was clipped, prepped, & draped in a sterile fashion.  Surgical timeout confirmed our plan.  A Foley catheter was attempted to be inserted but the patient's foreskin would not retract.  A urology consult was obtained intraoperatively.  The foreskin was stretched and the Foley was placed by the urologist.  The patient was positioned in reverse Trendelenburg.  Abdominal entry was gained using a varies needle in the left upper quadrant.  Entry was clean.  I induced carbon dioxide insufflation.  An 28mm robotic port was placed in the right upper quadrant.  Camera inspection revealed no injury.  Extra ports were carefully placed under direct laparoscopic visualization.  I laparoscopically reflected the greater omentum and the upper abdomen the small bowel in the upper abdomen. The patient was appropriately positioned and the robot was docked to the patient's left side.  Instruments were placed under direct visualization.    I mobilized the sigmoid colon off of the pelvic sidewall.  Identified the left ureter and the lateral dissection.  This was preserved.  There were significant mesenteric adhesions to the sacral promontory.  I scored  the base of peritoneum of the right side of the mesentery of the left colon from the ligament of Treitz to the peritoneal reflection of the mid rectum.  The patient had a loop of sigmoid colon that appeared adherent to the proximal rectum.  I elevated the  sigmoid mesentery and enetered into the retro-mesenteric plane. We were able to identify the left ureter and gonadal vessels in this view as well.  Significant scarring of the mesorectum kept [place] from elevating this off of the abdominal floor well. We kept those posterior within the retroperitoneum and elevated the left colon mesentery off that. I did isolated IMA pedicle but did not ligate it yet.  I continued distally and got into the avascular plane posterior to the mesorectum. This allowed me to help mobilize the rectum as well by freeing the mesorectum off the sacrum.  I mobilized the peritoneal coverings towards the peritoneal reflection on both the right and left sides of the rectum.  I could see the right and left ureters and stayed away from them.  There was significant scarring of the mesorectum.  I was unable to determine a clear plane due to this scarring.  I finally was able to find a lateral plane and connected this posteriorly.  Several different instruments were used to help with exposure and dissection.  I slowly continue my dissection posteriorly along the sacrum.  Significant scar tissue prevented me from any dissection.  This was taken down essentially millimeter by millimeter using a combination of electrocautery suction and retraction.  I came down to the pelvic floor.  I was able to find some soft tissue anteriorly and divided this using the robotic vessel sealer.  There was scar that continued posteriorly down to the anal rectal junction.  I attempted to divide this over a green load robotic stapler.  The stapler would not close.  During this time we were using a robotic suction device.  The device malfunctioned and had to be removed from the abdomen.  One of the pieces dislodged in the abdominal wall tissue.  This was retrieved later on.  An abdominal x-ray was obtained to confirm no remaining foreign bodies in the abdomen.   I then skeletonized the inferior mesenteric artery pedicle.  I  went down to its takeoff from the aorta.   I isolated the inferior mesenteric vein off of the ligament of Treitz just cephalad to that as well.  After confirming the left ureter was out of the way, I went ahead and ligated the inferior mesenteric artery pedicle with bipolar robotic vessel sealer ~2cm above its takeoff from the aorta.   I did ligate the inferior mesenteric vein in a similar fashion.  We ensured hemostasis. I skeletonized the mesorectum at the junction at the proximal rectum using blunt dissection & bipolar robotic vessel sealer.  I mobilized the left colon in a lateral to medial fashion off the line of Toldt up towards the splenic flexure to ensure good mobilization of the left colon to reach into the pelvis.  It did not appear that we had enough profuse colon to reach into the pelvis.  I read Dr. The robot to the patient's left upper quadrant and placed him in reverse Trendelenburg.  The splenic flexure was taken down using blunt mobilization and the robotic vessel sealer.  There was a high splenic flexure with a hairpin turn to the descending colon.  I divided the adhesions between these to help with mobilization.  The colon was  still not reach into the pelvis.  I divided more of the mesentery.  This appeared to devascularize the distal portion of the colon.  I confirmed this with intravascular firefly injection.  There was no perfusion to the mid descending colon and down.  I resected this using the green load robotic stapler.  This was sent to pathology as a separate specimen.  I did not feel that I can get the transverse colon mesentery to reach into the pelvis.  I decided to perform an end colostomy.  The distal rectum was divided using robotic scissors and electrocautery.  This was then sewn closed using a 2 oh V-Loc suture.  The robot was then undocked.  A Pfannenstiel incision was made and an Hunter wound protector was placed.The specimens were removed.  The abdomen was copiously irrigated  with warm normal saline.  Hemostasis was good.  A left upper quadrant ostomy was made through the rectus muscle.  The remaining colon was brought out through this and secured.  The right lower quadrant 12 mm port site was closed with a 0 Vicryl suture. A 19 [place] Blake drain was placed into the pelvis and brought out through the right lower quadrant and secured with a nylon suture.  Seprafilm was also placed into the pelvis for antiadhesion barrier.  The peritoneum could not be closed due to significant scarring and the patient had minimal omentum to reach the pelvis to help with preventing adhesions of the small bowel.  We then switched to clean gowns, gloves, instruments and drapes.  A Pfannenstiel peritoneum was closed using a running 0 Vicryl suture.  The fascia was closed using running #1 Novafil sutures.  The skin was closed using a 4-0 Vicryl subcuticular suture.  A sterile dressing was applied.  The remaining port sites were closed using 4-0 Vicryl sutures and Dermabond.  The ostomy was then matured in standard Brooke fashion using interrupted 2-0 Vicryl sutures.  An ostomy appliance was placed.  The patient was then awakened from anesthesia and sent to the postanesthesia care unit in stable condition.  All counts were correct per operating room staff.  An MD assistant was necessary for tissue manipulation, retraction and positioning due to the complexity of the case and hospital policies

## 2021-03-05 NOTE — OR Nursing (Signed)
Pt noted to have copious amounts of stool leaking from colostomy bag. Pt cleaned, new colostomy bag applied, new JP dressing and new honey comb dressing applied. JP noted to have copious amounts of drainage, 500 ml noted. Dr Marcello Moores at bedside and aware of copious amounts of stool and JP drainage. No new orders at this time.

## 2021-03-05 NOTE — Progress Notes (Signed)
Pt had 550 ml JP output since 7pm. JP dressing soaked and large amt of JP drainage noted on bedpad as well. Pt pulse elevated (see flowsheet). Yellow Mews started, on-call Marcello Moores notified of these things via page and phone call, no new orders.

## 2021-03-05 NOTE — Anesthesia Procedure Notes (Signed)
Procedure Name: Intubation Date/Time: 03/05/2021 9:15 AM Performed by: Lavina Hamman, CRNA Pre-anesthesia Checklist: Patient identified, Emergency Drugs available, Suction available, Patient being monitored and Timeout performed Patient Re-evaluated:Patient Re-evaluated prior to induction Oxygen Delivery Method: Circle system utilized Preoxygenation: Pre-oxygenation with 100% oxygen Induction Type: IV induction Ventilation: Mask ventilation without difficulty Laryngoscope Size: Mac and 4 Grade View: Grade II Tube type: Oral Tube size: 7.5 mm Number of attempts: 1 Airway Equipment and Method: Stylet Placement Confirmation: ETT inserted through vocal cords under direct vision,  positive ETCO2,  CO2 detector and breath sounds checked- equal and bilateral Secured at: 23 cm Tube secured with: Tape Dental Injury: Teeth and Oropharynx as per pre-operative assessment  Comments: ATOI

## 2021-03-05 NOTE — Transfer of Care (Signed)
Immediate Anesthesia Transfer of Care Note  Patient: Niel Peretti  Procedure(s) Performed: XI ROBOTIC ASSISTED LOWER ANTERIOR RESECTION WITH MOBILIZATION OF THE SPLENIC FLEXURE OF THE COLON AND END COLOSTOMY (N/A Abdomen) OSTOMY (N/A )  Patient Location: PACU  Anesthesia Type:General  Level of Consciousness: awake, alert  and oriented  Airway & Oxygen Therapy: Patient Spontanous Breathing and Patient connected to face mask oxygen  Post-op Assessment: Report given to RN, Post -op Vital signs reviewed and stable and Patient moving all extremities X 4  Post vital signs: Reviewed and stable  Last Vitals:  Vitals Value Taken Time  BP    Temp    Pulse    Resp    SpO2      Last Pain:  Vitals:   03/05/21 0752  TempSrc: Oral  PainSc:          Complications: No complications documented.

## 2021-03-05 NOTE — OR PostOp (Incomplete)
PACU TO INPATIENT HANDOFF REPORT  Name/Age/Gender Shane [name] 70 y.o. male  Code Status   Home/SNF/Other {Discharge Destination:18313::"Home"}  Chief Complaint Rectal cancer ([place]) [C20]  Level of Care/Admitting Diagnosis ED Disposition    None      Medical History Past Medical History:  Diagnosis Date  . Allergy   . Arthritis    knee  . Rectal cancer ([place]) 08/2020    Allergies No Known Allergies  IV Location/Drains/Wounds Patient Lines/Drains/Airways Status    Active Line/Drains/Airways    Name Placement date Placement time Site Days   Implanted Port 07/25/20 Right Chest 07/25/20  0931  Chest  223   Peripheral IV 03/05/21 Distal;Left;Posterior Forearm 03/05/21  0753  Forearm  less than 1   Peripheral IV 03/05/21 Right Hand 03/05/21  0922  Hand  less than 1   Closed System Drain 1 Right;Lateral Bulb (JP) 19 Fr. 03/05/21  1610  -  less than 1   Colostomy LLQ 03/05/21  1610  LLQ  less than 1   Urethral Catheter Dr Matilde Sprang Latex;Triple-lumen 16 Fr. 03/05/21  1000  Latex;Triple-lumen  less than 1   Incision (Closed) 07/25/20 Neck Right 07/25/20  0945  - 223   Incision (Closed) 07/25/20 Chest Right;Upper 07/25/20  0945  - 223   Incision (Closed) 03/05/21 Abdomen Other (Comment) 03/05/21  1624  - less than 1   Incision - 5 Ports Abdomen  Right;Lateral Right;Umbilicus Lower;Right Upper;Mid Upper;Left 03/05/21  1248  - less than 1          Labs/Imaging Results for orders placed or performed during the hospital encounter of 03/05/21 (from the past 48 hour(s))  Type and screen [place]     Status: None (Preliminary result)   Collection Time: 03/05/21  7:45 AM  Result Value Ref Range   ABO/RH(D) O POS    Antibody Screen NEG    Sample Expiration      03/08/2021,2359 Performed at [facility], [place] [address] [place], [place] [address]    Unit Number [id number]    Blood Component Type RED CELLS,LR    Unit division 00     Status of Unit ALLOCATED    Transfusion Status OK TO TRANSFUSE    Crossmatch Result Compatible    Unit Number [id number]    Blood Component Type RED CELLS,LR    Unit division 00    Status of Unit ALLOCATED    Transfusion Status OK TO TRANSFUSE    Crossmatch Result Compatible   ABO/Rh     Status: None   Collection Time: 03/05/21  7:50 AM  Result Value Ref Range   ABO/RH(D)      O POS Performed at [facility], [place] [address] [place], [place] [address]   Prepare RBC (crossmatch)     Status: None   Collection Time: 03/05/21  2:20 PM  Result Value Ref Range   Order Confirmation      ORDER PROCESSED BY BLOOD BANK Performed at [facility], [place] [address] [place], [place] [address]    DG Abd 1 View  Result Date: 03/05/2021 CLINICAL DATA:  Possible retained foreign body. EXAM: ABDOMEN - 1 VIEW; DG C-ARM 1-60 MIN-NO REPORT FLUOROSCOPY TIME:  3 seconds. COMPARISON:  None. FINDINGS: Four intraoperative fluoroscopic images were obtained of the abdomen. These demonstrate nasogastric tube tip in expected position of proximal stomach. There appears to be a surgical drain in the pelvis. There is no definite evidence of other  radiopaque foreign body. IMPRESSION: Surgical drain seen in the pelvis. Nasogastric tube tip seen in expected position of proximal stomach. No other definite foreign body is noted. Electronically Signed   By: Marijo Conception M.D.   On: 03/05/2021 16:22   DG C-Arm 1-60 Min-No Report  Result Date: 03/05/2021 CLINICAL DATA:  Possible retained foreign body. EXAM: ABDOMEN - 1 VIEW; DG C-ARM 1-60 MIN-NO REPORT FLUOROSCOPY TIME:  3 seconds. COMPARISON:  None. FINDINGS: Four intraoperative fluoroscopic images were obtained of the abdomen. These demonstrate nasogastric tube tip in expected position of proximal stomach. There appears to be a surgical drain in the pelvis. There is no definite evidence of other radiopaque foreign body.  IMPRESSION: Surgical drain seen in the pelvis. Nasogastric tube tip seen in expected position of proximal stomach. No other definite foreign body is noted. Electronically Signed   By: Marijo Conception M.D.   On: 03/05/2021 16:22    Pending Labs   Vitals/Pain Today's Vitals   03/05/21 0749 03/05/21 0752 03/05/21 1645 03/05/21 1745  BP:  (!) 141/80 106/77   Pulse:  90 91   Resp:  18 14   Temp:  98.1 F (36.7 C) 97.6 F (36.4 C) 97.6 F (36.4 C)  TempSrc:  Oral    SpO2:  100% 99%   Weight: 83.5 kg     PainSc: 5        Isolation Precautions @ISOLATION @  Administered Medications Periop Administered Meds from 03/05/2021 0726 to 03/05/2021 1749      Date/Time Order Dose Route Action Action by Comments    03/05/2021 1155 0.9 % irrigation (POUR BTL) 12,000 mL Irrigation Given Leighton Ruff, MD     61/60/7371 0754 acetaminophen (TYLENOL) tablet 1,000 mg 1,000 mg Oral Given Karsten Ro, RN     03/05/2021 1618 albumin human 5 % solution 0  Intravenous Stopped Niel Hummer, CRNA     03/05/2021 1558 albumin human 5 % solution   Intravenous New Bag/Given Niel Hummer, CRNA     03/05/2021 1546 albumin human 5 % solution 0  Intravenous Stopped Niel Hummer, CRNA     03/05/2021 1533 albumin human 5 % solution   Intravenous New Bag/Given Niel Hummer, CRNA     03/05/2021 1349 albumin human 5 % solution 0  Intravenous Stopped Key, Kristopher, CRNA     03/05/2021 1339 albumin human 5 % solution   Intravenous New Bag/Given Key, Kristopher, CRNA     03/05/2021 1147 albumin human 5 % solution 0  Intravenous Stopped Key, Kristopher, CRNA     03/05/2021 1127 albumin human 5 % solution   Intravenous New Bag/Given Key, Kristopher, CRNA     03/05/2021 0754 alvimopan (ENTEREG) capsule 12 mg 12 mg Oral Given Karsten Ro, RN     03/05/2021 1610 bupivacaine liposome (EXPAREL) 1.3 % injection 20 mL Infiltration Given Leighton Ruff, MD     05/04/9484 1610  bupivacaine-EPINEPHrine (MARCAINE W/ EPI) 0.25% -1:200000 (with pres) injection 30 mL Infiltration Given Leighton Ruff, MD Mixed with saline    03/05/2021 1638 cefoTEtan (CEFOTAN) 2 g in sodium chloride 0.9 % 100 mL IVPB   Intravenous Anesthesia Volume Adjustment Niel Hummer, CRNA     03/05/2021 0926 cefoTEtan (CEFOTAN) 2 g in sodium chloride 0.9 % 100 mL IVPB 2 g Intravenous New Bag/Given Key, Kristopher, CRNA     03/05/2021 0755 chlorhexidine (PERIDEX) 0.12 % solution 15 mL 15 mL Mouth/Throat Given Karsten Ro, RN     03/05/2021 0939 dexamethasone (  DECADRON) injection 8 mg Intravenous Given Key, Kristopher, CRNA     03/05/2021 1336 dexmedetomidine (PRECEDEX) in NS 20 mcg/16mL (4 mcg/mL) IV Syringe 4 mcg Intravenous Given Key, Kristopher, CRNA     03/05/2021 1108 dexmedetomidine (PRECEDEX) in NS 20 mcg/23mL (4 mcg/mL) IV Syringe 8 mcg Intravenous Given Key, Kristopher, CRNA     03/05/2021 1031 dexmedetomidine (PRECEDEX) in NS 20 mcg/84mL (4 mcg/mL) IV Syringe 8 mcg Intravenous Given Key, Kristopher, CRNA     03/05/2021 1631 fentaNYL (SUBLIMAZE) injection 25 mcg Intravenous Given Nelle Don, CRNA     03/05/2021 1621 fentaNYL (SUBLIMAZE) injection 25 mcg Intravenous Given Nelle Don, CRNA     03/05/2021 1518 fentaNYL (SUBLIMAZE) injection 50 mcg Intravenous Given Nelle Don, CRNA     03/05/2021 1427 fentaNYL (SUBLIMAZE) injection 50 mcg Intravenous Given Key, Kristopher, CRNA     03/05/2021 1046 fentaNYL (SUBLIMAZE) injection 50 mcg Intravenous Given Ezekiel Ina, CRNA     03/05/2021 0930 fentaNYL (SUBLIMAZE) injection 100 mcg Intravenous Given Theodosia Quay, CRNA     03/05/2021 [phone] fentaNYL (SUBLIMAZE) injection 100 mcg Intravenous Given Theodosia Quay, CRNA     03/05/2021 0754 gabapentin (NEURONTIN) capsule 300 mg 300 mg Oral Given Linard Millers, RN     03/05/2021 0755 heparin injection 5,000 Units 5,000 Units Subcutaneous Given Linard Millers, RN subq     03/05/2021 1715 HYDROmorphone (DILAUDID) 1 MG/ML injection 1 mg  Given Cheryl Flash, RN     03/05/2021 1705 HYDROmorphone (DILAUDID) injection 0.25-0.5 mg 0.5 mg Intravenous Given Cheryl Flash, RN     03/05/2021 1700 HYDROmorphone (DILAUDID) injection 0.25-0.5 mg 0.5 mg Intravenous Given Cheryl Flash, RN     03/05/2021 1655 HYDROmorphone (DILAUDID) injection 0.25-0.5 mg 0.5 mg Intravenous Given Cheryl Flash, RN     03/05/2021 1650 HYDROmorphone (DILAUDID) injection 0.25-0.5 mg 0.5 mg Intravenous Given Cheryl Flash, RN     03/05/2021 1501 indocyanine green (IC-GREEN) injection 7.5 mg Intravenous Given Nelle Don, CRNA per surgeon request    03/05/2021 1638 lactated ringers infusion   Intravenous Anesthesia Volume Adjustment Nelle Don, CRNA     03/05/2021 1528 lactated ringers infusion   Intravenous New Bag/Given Nelle Don, CRNA     03/05/2021 1123 lactated ringers infusion   Intravenous New Bag/Given Theodosia Quay, CRNA     03/05/2021 5997 lactated ringers infusion   Intravenous New Bag/Given Theodosia Quay, CRNA     03/05/2021 7414 lactated ringers infusion   Intravenous Paused Key, Kristopher, CRNA Switch to gravity    03/05/2021 0908 lactated ringers infusion   Intravenous Continued from Pre-op Key, Kristopher, CRNA     03/05/2021 0753 lactated ringers infusion   Intravenous New Bag/Given Linard Millers, RN     03/05/2021 1638 lactated ringers infusion   Intravenous Anesthesia Volume Adjustment Nelle Don, CRNA     03/05/2021 1327 lactated ringers infusion   Intravenous New Bag/Given Theodosia Quay, CRNA     03/05/2021 0920 lactated ringers infusion   Intravenous New Bag/Given Key, Kristopher, CRNA     03/05/2021 1156 lactated ringers irrigation solution 3,000 mL Irrigation Given Romie Levee, MD     03/05/2021 1427 lidocaine 2% (20 mg/mL) 5 mL syringe 0 mg/kg/hr Intravenous Stopped Key, Kristopher, CRNA      03/05/2021 1153 lidocaine 2% (20 mg/mL) 5 mL syringe 1 mg/kg/hr Intravenous Rate/Dose Change Key, Kristopher, CRNA     03/05/2021 0926 lidocaine 2% (20 mg/mL) 5 mL syringe 1.5 mg/kg/hr Intravenous New Bag/Given Key,  Kristopher, CRNA     03/05/2021 0916 lidocaine 2% (20 mg/mL) 5 mL syringe 80 mg Intravenous Given Nolon Nations, MD     03/05/2021 0755 MEDLINE mouth rinse   Mouth Rinse See Alternative Karsten Ro, RN     03/05/2021 8546 midazolam (VERSED) 5 MG/5ML injection 2 mg Intravenous Given Key, Kristopher, CRNA     03/05/2021 1615 ondansetron (ZOFRAN) injection 4 mg Intravenous Given Niel Hummer, CRNA     03/05/2021 1515 phenylephrine (NEOSYNEPHRINE) 10-0.9 MG/250ML-% infusion 0 mcg/min Intravenous Stopped Niel Hummer, CRNA     03/05/2021 1444 phenylephrine (NEOSYNEPHRINE) 10-0.9 MG/250ML-% infusion 15 mcg/min Intravenous Rate/Dose Change Niel Hummer, CRNA     03/05/2021 1400 phenylephrine (NEOSYNEPHRINE) 10-0.9 MG/250ML-% infusion 10 mcg/min Intravenous Rate/Dose Change Key, Kristopher, CRNA     03/05/2021 1332 phenylephrine (NEOSYNEPHRINE) 10-0.9 MG/250ML-% infusion 25 mcg/min Intravenous Rate/Dose Change Key, Kristopher, CRNA     03/05/2021 1232 phenylephrine (NEOSYNEPHRINE) 10-0.9 MG/250ML-% infusion 35 mcg/min Intravenous Rate/Dose Change Key, Kristopher, CRNA     03/05/2021 1131 phenylephrine (NEOSYNEPHRINE) 10-0.9 MG/250ML-% infusion 25 mcg/min Intravenous Restarted Lavina Hamman, CRNA     03/05/2021 1027 phenylephrine (NEOSYNEPHRINE) 10-0.9 MG/250ML-% infusion 0 mcg/min Intravenous Stopped Key, Kristopher, CRNA     03/05/2021 1019 phenylephrine (NEOSYNEPHRINE) 10-0.9 MG/250ML-% infusion 15 mcg/min Intravenous Rate/Dose Change Key, Kristopher, CRNA     03/05/2021 0945 phenylephrine (NEOSYNEPHRINE) 10-0.9 MG/250ML-% infusion 35 mcg/min Intravenous New Bag/Given Key, Kristopher, CRNA     03/05/2021 1610 PHENYLephrine 40 mcg/ml in normal saline Adult IV Push  Syringe (For Blood Pressure Support) 120 mcg Intravenous Given Niel Hummer, CRNA     03/05/2021 0917 propofol (DIPRIVAN) 10 mg/mL bolus/IV push 140 mg Intravenous Given Nolon Nations, MD     03/05/2021 1517 rocuronium bromide 10 mg/mL (PF) syringe 20 mg Intravenous Given Niel Hummer, CRNA     03/05/2021 1357 rocuronium bromide 10 mg/mL (PF) syringe 20 mg Intravenous Given Key, Kristopher, CRNA Surgeon asked for more muscle relaxant.    03/05/2021 1336 rocuronium bromide 10 mg/mL (PF) syringe 15 mg Intravenous Given Key, Kristopher, CRNA     03/05/2021 1248 rocuronium bromide 10 mg/mL (PF) syringe 15 mg Intravenous Given Key, Kristopher, CRNA     03/05/2021 1151 rocuronium bromide 10 mg/mL (PF) syringe 20 mg Intravenous Given Key, Kristopher, CRNA     03/05/2021 1124 rocuronium bromide 10 mg/mL (PF) syringe 10 mg Intravenous Given Key, Kristopher, CRNA     03/05/2021 1051 rocuronium bromide 10 mg/mL (PF) syringe 20 mg Intravenous Given Milford Cage, CRNA     03/05/2021 1014 rocuronium bromide 10 mg/mL (PF) syringe 20 mg Intravenous Given Key, Kristopher, CRNA     03/05/2021 0949 rocuronium bromide 10 mg/mL (PF) syringe 20 mg Intravenous Given Key, Kristopher, CRNA     03/05/2021 0936 rocuronium bromide 10 mg/mL (PF) syringe 20 mg Intravenous Given Key, Kristopher, CRNA     03/05/2021 [phone] rocuronium bromide 10 mg/mL (PF) syringe 60 mg Intravenous Given Nolon Nations, MD     03/05/2021 1459 sodium chloride irrigation 0.9 % 3,000 mL Irrigation Given Leighton Ruff, MD     50/07/3817 1619 sugammadex sodium (BRIDION) injection 200 mg Intravenous Given Niel Hummer, CRNA       Mobility {Mobility:20148}

## 2021-03-06 ENCOUNTER — Other Ambulatory Visit: Payer: Self-pay

## 2021-03-06 ENCOUNTER — Encounter (HOSPITAL_COMMUNITY): Payer: Self-pay | Admitting: General Surgery

## 2021-03-06 LAB — CBC
HCT: 38.4 % — ABNORMAL LOW (ref 39.0–52.0)
Hemoglobin: 13.1 g/dL (ref 13.0–17.0)
MCH: 30.9 pg (ref 26.0–34.0)
MCHC: 34.1 g/dL (ref 30.0–36.0)
MCV: 90.6 fL (ref 80.0–100.0)
Platelets: 227 10*3/uL (ref 150–400)
RBC: 4.24 MIL/uL (ref 4.22–5.81)
RDW: 14.6 % (ref 11.5–15.5)
WBC: 3 10*3/uL — ABNORMAL LOW (ref 4.0–10.5)
nRBC: 0 % (ref 0.0–0.2)

## 2021-03-06 LAB — BASIC METABOLIC PANEL
Anion gap: 10 (ref 5–15)
BUN: 22 mg/dL (ref 8–23)
CO2: 16 mmol/L — ABNORMAL LOW (ref 22–32)
Calcium: 8.2 mg/dL — ABNORMAL LOW (ref 8.9–10.3)
Chloride: 113 mmol/L — ABNORMAL HIGH (ref 98–111)
Creatinine, Ser: 1.91 mg/dL — ABNORMAL HIGH (ref 0.61–1.24)
GFR, Estimated: 37 mL/min — ABNORMAL LOW (ref >60)
Glucose, Bld: 144 mg/dL — ABNORMAL HIGH (ref 70–99)
Potassium: 4.5 mmol/L (ref 3.5–5.1)
Sodium: 139 mmol/L (ref 135–145)

## 2021-03-06 LAB — TROPONIN I (HIGH SENSITIVITY)
Troponin I (High Sensitivity): 19 ng/L — ABNORMAL HIGH (ref <18)
Troponin I (High Sensitivity): 16 ng/L (ref <18)

## 2021-03-06 MED ORDER — SODIUM CHLORIDE 0.9 % IV BOLUS
1000.0000 mL | Freq: Once | INTRAVENOUS | Status: AC
  Administered 2021-03-06: 1000 mL via INTRAVENOUS

## 2021-03-06 MED ORDER — ACETAMINOPHEN 10 MG/ML IV SOLN
1000.0000 mg | Freq: Four times a day (QID) | INTRAVENOUS | Status: AC
  Administered 2021-03-06 – 2021-03-07 (×4): 1000 mg via INTRAVENOUS
  Filled 2021-03-06 (×5): qty 100

## 2021-03-06 MED ORDER — CHLORHEXIDINE GLUCONATE CLOTH 2 % EX PADS
6.0000 | MEDICATED_PAD | Freq: Every day | CUTANEOUS | Status: DC
  Administered 2021-03-06 – 2021-04-11 (×36): 6 via TOPICAL

## 2021-03-06 MED ORDER — SODIUM CHLORIDE 0.9 % IV BOLUS
500.0000 mL | Freq: Once | INTRAVENOUS | Status: AC
  Administered 2021-03-06: 500 mL via INTRAVENOUS

## 2021-03-06 MED ORDER — SODIUM CHLORIDE 0.9 % IV SOLN: INTRAVENOUS | Status: DC

## 2021-03-06 NOTE — Consult Note (Signed)
[place] Nurse ostomy follow up Patient continues to be SOB, pain level up.  Bedside RN administering IV pain meds.  Pouch changed bc of leaking at umbilicus when patient was up in the chair. Daughter is at the bedside, she is a CMA.  She will be assisting in patient's care at home. She observed me change pouch today. Patient is not able to participate in education or care at this time due to current status.   Stoma type/location: LUQ, end colostomy Stomal assessment/size: 1 1/2" x 1 3/4" oval shaped, dusky but viable, darkened at mucocutaneous junction  Peristomal assessment: intact with dip in abdominal topography from 2-42 oclock at umbilicus  Treatment options for stomal/peristomal skin: used 2" skin barrier ring Output pasty brown; large amounts Ostomy pouching: 1pc.flat with 2" skin barrier ring used bc no soft convexity available and pouch is leaking  Education provided with patient's daughter  Explained role of ostomy nurse and creation of stoma  Explained stoma characteristics (budded, flush, color, texture, care) Demonstrated pouch change (cutting new skin barrier, measuring stoma, cleaning peristomal skin and stoma, use of barrier ring) Discussed dip in the skin and rationale for possibly changing tomorrow for new pouching system (flex convex with belt). She verbalized understanding, reports she is visual learning and today was very helpful.  Enrolled patient in [place] Start Discharge program: Yes   [place] Nurse will follow along with you for continued support with ostomy teaching and care [place] MSN, [place], [place], [place], [place]

## 2021-03-06 NOTE — Progress Notes (Signed)
   03/06/21 0117  Assess: MEWS Score  Temp 99.7 F (37.6 C)  BP (!) 88/63  Pulse Rate (!) 121  Resp (!) 22  Level of Consciousness Alert  SpO2 100 %  O2 Device Nasal Cannula  O2 Flow Rate (L/min) 2 L/min    Red mews protocol initiated (see flowsheet). Marcello Moores MD notified via page, she called back. See new orders.

## 2021-03-06 NOTE — Progress Notes (Signed)
Dr. Thermon Leyland aware via phone of pt status- Low BP sys 50-70 & dys 39-59, HR 120's, and in a lot of pain. Alert and oriented. MD coming up to see pt.

## 2021-03-06 NOTE — Progress Notes (Signed)
Dr. Thermon Leyland in to check on pt. Pt in no distress. See new orders written by MD.

## 2021-03-06 NOTE — Progress Notes (Signed)
   03/06/21 0230  Assess: MEWS Score  Temp 100.1 F (37.8 C)  BP 118/83  Pulse Rate (!) 116  Resp 20  Level of Consciousness Alert  SpO2 100 %  O2 Device Room Air  O2 Flow Rate (L/min) 2 L/min    After interventions (bolus), pt returned to yellow mews. Will continue red mews vitals as per protocol.

## 2021-03-06 NOTE — Progress Notes (Signed)
Dr. Marcello Moores aware via phone pt's pain better yet BP still low at 80/48. See order received.

## 2021-03-06 NOTE — Progress Notes (Signed)
1 Day Post-Op Robotic LAR and colostomy  Subjective: Having some tachycardia and hypotension overnight   fluid bolus given.  UOP picking up.  Good ostomy function.  Had a fever as well  Objective: Vital signs in last 24 hours: Temp:  [97.6 F (36.4 C)-101.3 F (38.5 C)] 98.5 F (36.9 C) (04/28 0525) Pulse Rate:  [90-126] 126 (04/28 0422) Resp:  [6-22] 20 (04/28 0422) BP: (88-141)/(59-87) 93/59 (04/28 0422) SpO2:  [90 %-100 %] 100 % (04/28 0422) Weight:  [83.5 kg] 83.5 kg (04/27 0749)   Intake/Output from previous day: 04/27 0701 - 04/28 0700 In: 4800 [I.V.:3700; IV Piggyback:1100] Out: 3070 [Urine:800; Drains:1720; Stool:200; Blood:350] Intake/Output this shift: Total I/O In: -  Out: 1770 [Urine:400; Drains:1170; Stool:200]   General appearance: alert and mild distress GI: normal findings: soft, appropriately tender  Incision: no significant drainage JP: dark bloody output  Lab Results:  Recent Labs    03/06/21 0424  WBC 3.0*  HGB 13.1  HCT 38.4*  PLT 227   BMET Recent Labs    03/06/21 0424  NA 139  K 4.5  CL 113*  CO2 16*  GLUCOSE 144*  BUN 22  CREATININE 1.91*  CALCIUM 8.2*   PT/INR No results for input(s): LABPROT, INR in the last 72 hours. ABG No results for input(s): PHART, HCO3 in the last 72 hours.  Invalid input(s): PCO2, PO2  MEDS, Scheduled . acetaminophen  1,000 mg Oral Q6H  . alvimopan  12 mg Oral BID  . bacitracin   Topical BID  . enoxaparin (LOVENOX) injection  40 mg Subcutaneous Q24H  . feeding supplement  237 mL Oral BID BM  . gabapentin  300 mg Oral BID  . saccharomyces boulardii  250 mg Oral BID    Studies/Results: DG Abd 1 View  Result Date: 03/05/2021 CLINICAL DATA:  Possible retained foreign body. EXAM: ABDOMEN - 1 VIEW; DG C-ARM 1-60 MIN-NO REPORT FLUOROSCOPY TIME:  3 seconds. COMPARISON:  None. FINDINGS: Four intraoperative fluoroscopic images were obtained of the abdomen. These demonstrate nasogastric tube tip in  expected position of proximal stomach. There appears to be a surgical drain in the pelvis. There is no definite evidence of other radiopaque foreign body. IMPRESSION: Surgical drain seen in the pelvis. Nasogastric tube tip seen in expected position of proximal stomach. No other definite foreign body is noted. Electronically Signed   By: Marijo Conception M.D.   On: 03/05/2021 16:22   DG C-Arm 1-60 Min-No Report  Result Date: 03/05/2021 CLINICAL DATA:  Possible retained foreign body. EXAM: ABDOMEN - 1 VIEW; DG C-ARM 1-60 MIN-NO REPORT FLUOROSCOPY TIME:  3 seconds. COMPARISON:  None. FINDINGS: Four intraoperative fluoroscopic images were obtained of the abdomen. These demonstrate nasogastric tube tip in expected position of proximal stomach. There appears to be a surgical drain in the pelvis. There is no definite evidence of other radiopaque foreign body. IMPRESSION: Surgical drain seen in the pelvis. Nasogastric tube tip seen in expected position of proximal stomach. No other definite foreign body is noted. Electronically Signed   By: Marijo Conception M.D.   On: 03/05/2021 16:22    Assessment: s/p Procedure(s): XI ROBOTIC ASSISTED LOWER ANTERIOR RESECTION WITH MOBILIZATION OF THE SPLENIC FLEXURE OF THE COLON AND END COLOSTOMY OSTOMY Patient Active Problem List   Diagnosis Date Noted  . Rectal cancer ([place]) 03/05/2021  . Port-A-Cath in place 12/10/2020  . Rectal carcinoma ([place]) 07/18/2020  . Diarrhea 07/09/2020  . Rectal bleeding 07/09/2020  . Iron deficiency anemia  07/04/2020  . Rectal mass 07/04/2020  . Goals of care, counseling/discussion 07/04/2020  . S/P carpal tunnel release 01/30/2020  . Right carpal tunnel syndrome 09/19/2018  . 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 Abnormality of gait 02/23/2018  . Degenerative cervical disc 03/24/2016  . 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 Routine general medical examination at a health care  facility 09/02/2015  . Numbness and tingling in hands 08/27/2015    Significant post surgical inflammatory response.  Will support with IVF's and pain control at this time and monitor.    Plan: diet as tolerated  Cont IVF's, switched to 0.9NS due to elevated Cr Elevated Cr: appears to be due to dehydration, K 4.5, will hydrate and follow closely Cont foley to monitor UOP PT for help with ambulation once BP improves Ostomy teaching   LOS: 1 day     .Rosario Adie, MD [facility], [place]    03/06/2021 6:28 AM

## 2021-03-06 NOTE — Progress Notes (Signed)
Pt with hypotension and tachycardia this morning.  Responding to fluid bolus.  Pt appears very dehydrated.  IVFs increased to 200 m/h.  Will hold on PO meds given his increasing abd distention.  Ostomy beefy red and viable.  JP output SS.  EKG and trop shows signs of tachycardia but do not imply a cardiac event.  Will follow closely.  Monitor UOP.  PO meds held.  IV tylenol ordered.  Will try small increments of IV dilaudid for additional pain control.     Rosario Adie, MD  Colorectal and [facility]

## 2021-03-06 NOTE — Consult Note (Signed)
[place] Nurse ostomy follow up Stoma type/location: RUQ; end colostomy Stomal assessment/size: visualized through pouch Peristomal assessment: NA Treatment options for stomal/peristomal skin: pouch intact from placement last pm Output liquid brown Ostomy pouching: 1pc. Education provided: patient is tachycardic and barely arousal at the time of my visit. Rapid respirations and intense pain as well with hypotension.   Discussed role of ostomy nurse to the daughter at the bedside. Patient is not able to learn any care today.  I will return to check patient later in the day for any potential teaching. Educational booklet left with patient's daughter.  Enrolled patient in [place] Start Discharge program: Yes   [place] Nurse will follow along with you for continued support with ostomy teaching and care [place] MSN, [place], [place], [place], [place]

## 2021-03-06 NOTE — Progress Notes (Signed)
PT Cancellation Note  Patient Details Name: Shane [name] MRN: [id number] DOB: 1951/03/26   Cancelled Treatment:    Reason Eval/Treat Not Completed: Medical issues which prohibited therapy Nursing attempting to improve pt's low BP today.  Will check back as schedule permits.   Elizabeht Suto,KATHrine E 03/06/2021, 1:08 PM [place] PT, DPT Acute Rehabilitation Services Pager: [phone] Office: [phone]

## 2021-03-07 ENCOUNTER — Inpatient Hospital Stay (HOSPITAL_COMMUNITY): Payer: Medicare Other

## 2021-03-07 DIAGNOSIS — R410 Disorientation, unspecified: Secondary | ICD-10-CM

## 2021-03-07 LAB — GLUCOSE, CAPILLARY
Glucose-Capillary: 53 mg/dL — ABNORMAL LOW (ref 70–99)
Glucose-Capillary: 56 mg/dL — ABNORMAL LOW (ref 70–99)
Glucose-Capillary: 69 mg/dL — ABNORMAL LOW (ref 70–99)
Glucose-Capillary: 76 mg/dL (ref 70–99)
Glucose-Capillary: 86 mg/dL (ref 70–99)
Glucose-Capillary: 88 mg/dL (ref 70–99)
Glucose-Capillary: 90 mg/dL (ref 70–99)
Glucose-Capillary: 93 mg/dL (ref 70–99)
Glucose-Capillary: 99 mg/dL (ref 70–99)

## 2021-03-07 LAB — CBC
HCT: 34.1 % — ABNORMAL LOW (ref 39.0–52.0)
Hemoglobin: 11.5 g/dL — ABNORMAL LOW (ref 13.0–17.0)
MCH: 30.9 pg (ref 26.0–34.0)
MCHC: 33.7 g/dL (ref 30.0–36.0)
MCV: 91.7 fL (ref 80.0–100.0)
Platelets: 140 10*3/uL — ABNORMAL LOW (ref 150–400)
RBC: 3.72 MIL/uL — ABNORMAL LOW (ref 4.22–5.81)
RDW: 14.7 % (ref 11.5–15.5)
WBC: 7.8 10*3/uL (ref 4.0–10.5)
nRBC: 0 % (ref 0.0–0.2)

## 2021-03-07 LAB — BASIC METABOLIC PANEL
Anion gap: 11 (ref 5–15)
BUN: 38 mg/dL — ABNORMAL HIGH (ref 8–23)
CO2: 15 mmol/L — ABNORMAL LOW (ref 22–32)
Calcium: 7.9 mg/dL — ABNORMAL LOW (ref 8.9–10.3)
Chloride: 111 mmol/L (ref 98–111)
Creatinine, Ser: 2.81 mg/dL — ABNORMAL HIGH (ref 0.61–1.24)
GFR, Estimated: 23 mL/min — ABNORMAL LOW (ref >60)
Glucose, Bld: 66 mg/dL — ABNORMAL LOW (ref 70–99)
Potassium: 4.9 mmol/L (ref 3.5–5.1)
Sodium: 137 mmol/L (ref 135–145)

## 2021-03-07 LAB — MRSA PCR SCREENING: MRSA by PCR: NEGATIVE

## 2021-03-07 MED ORDER — DEXTROSE 50 % IV SOLN: 1.0000 | Freq: Once | INTRAVENOUS | Status: AC

## 2021-03-07 MED ORDER — SODIUM CHLORIDE 0.9 % IV BOLUS
1000.0000 mL | Freq: Once | INTRAVENOUS | Status: AC
  Administered 2021-03-07: 1000 mL via INTRAVENOUS

## 2021-03-07 MED ORDER — METOPROLOL TARTRATE 5 MG/5ML IV SOLN
5.0000 mg | Freq: Four times a day (QID) | INTRAVENOUS | Status: DC | PRN
  Administered 2021-03-07 – 2021-03-08 (×3): 5 mg via INTRAVENOUS
  Filled 2021-03-07 (×3): qty 5

## 2021-03-07 MED ORDER — DEXTROSE-NACL 5-0.45 % IV SOLN: INTRAVENOUS | Status: DC

## 2021-03-07 MED ORDER — DEXTROSE 50 % IV SOLN: 12.5000 g | INTRAVENOUS | Status: AC

## 2021-03-07 MED ORDER — DEXTROSE 50 % IV SOLN
INTRAVENOUS | Status: AC
  Administered 2021-03-07: 12.5 g via INTRAVENOUS
  Filled 2021-03-07: qty 50

## 2021-03-07 MED ORDER — ORAL CARE MOUTH RINSE
15.0000 mL | Freq: Two times a day (BID) | OROMUCOSAL | Status: DC
  Administered 2021-03-07 – 2021-03-09 (×5): 15 mL via OROMUCOSAL

## 2021-03-07 MED ORDER — DEXTROSE 50 % IV SOLN
12.5000 g | INTRAVENOUS | Status: DC | PRN
  Administered 2021-03-07: 25 g via INTRAVENOUS
  Administered 2021-03-07 – 2021-03-09 (×2): 12.5 g via INTRAVENOUS
  Filled 2021-03-07 (×2): qty 50

## 2021-03-07 NOTE — Progress Notes (Signed)
Pharmacy Brief Note - Alvimopan (Entereg)  The standing order set for alvimopan (Entereg) now includes an automatic order to discontinue the drug after the patient has had a bowel movement.  The change was approved by the [place] and the Medical Executive Committee.    This patient has had a good ostomy output as documented by [facility] nurse and bedside RN.  Therefore, alvimopan has been discontinued.  If there are questions, please contact the pharmacy at [phone].  Thank you  Gretta Arab PharmD, BCPS Pager [phone] 03/07/2021 8:08 AM

## 2021-03-07 NOTE — Progress Notes (Signed)
Pt evaluated this afternoon.  Abd soft.  Making more urine.  Pain ok.  BP better, HR coming down.  Pt would like to get up in a chair.  I think this is reasonable.  Wife in the room and updated.    Rosario Adie, MD  Colorectal and [place] [facility]

## 2021-03-07 NOTE — Progress Notes (Signed)
Hypoglycemic Event  CBG: 56  Treatment: 12.5 g dextrose 50%, per protocol  Symptoms: clammy  Follow-up CBG: Time:0405 CBG Result:88  Possible Reasons for Event: not taking many POs  Comments/MD notified:Dr. Ninfa Linden notified. No new orders at this time    [facility]

## 2021-03-07 NOTE — TOC Initial Note (Signed)
Transition of Care [facility]) - Initial/Assessment Note    Patient Details  Name: Shane [name] MRN: [id number] Date of Birth: Feb 21, 1951  Transition of Care [facility]) CM/SW Contact:    Leeroy Cha, RN Phone Number: 03/07/2021, 8:28 AM  Clinical Narrative:                 1 Day Post-Op Robotic LAR and colostomy  Subjective: Having some tachycardia and hypotension overnight   fluid bolus given.  UOP picking up.  Good ostomy function.  Had a fever as well  Objective: Vital signs in last 24 hours: Temp:  [97.6 F (36.4 C)-101.3 F (38.5 C)] 98.5 F (36.9 C) (04/28 0525) Pulse Rate:  [90-126] 126 (04/28 0422) Resp:  [6-22] 20 (04/28 0422) BP: (88-141)/(59-87) 93/59 (04/28 0422) SpO2:  [90 %-100 %] 100 % (04/28 0422) Weight:  [83.5 kg] 83.5 kg (04/27 0749) PLAN: home with possible hhc for colostomy care if not taught while in house.  Wife is support. Expected Discharge Plan: [place] Barriers to Discharge: Continued Medical Work up   Patient Goals and CMS Choice Patient states their goals for this hospitalization and ongoing recovery are:: I want to go home CMS Medicare.gov Compare Post Acute Care list provided to:: Patient Choice offered to / list presented to : Patient  Expected Discharge Plan and Services Expected Discharge Plan: [place]   Discharge Planning Services: CM Consult   Living arrangements for the past 2 months: Single Family Home                                      Prior Living Arrangements/Services Living arrangements for the past 2 months: Single Family Home Lives with:: Spouse Patient language and need for interpreter reviewed:: Yes Do you feel safe going back to the place where you live?: Yes      Need for Family Participation in Patient Care: Yes (Comment) Care giver support system in place?: Yes (comment)   Criminal Activity/Legal Involvement Pertinent to Current Situation/Hospitalization: No -  Comment as needed  Activities of Daily Living Home Assistive Devices/Equipment: Eyeglasses ADL Screening (condition at time of admission) Patient's cognitive ability adequate to safely complete daily activities?: Yes Is the patient deaf or have difficulty hearing?: No Does the patient have difficulty seeing, even when wearing glasses/contacts?: No Does the patient have difficulty concentrating, remembering, or making decisions?: No Patient able to express need for assistance with ADLs?: Yes Does the patient have difficulty dressing or bathing?: No Independently performs ADLs?: No Communication: Independent Dressing (OT): Needs assistance Is this a change from baseline?: Change from baseline, expected to last <3days Grooming: Needs assistance Is this a change from baseline?: Change from baseline, expected to last <3 days Feeding: Independent Bathing: Needs assistance Is this a change from baseline?: Change from baseline, expected to last <3 days Toileting: Needs assistance Is this a change from baseline?: Change from baseline, expected to last <3 days In/Out Bed: Needs assistance Is this a change from baseline?: Change from baseline, expected to last <3 days Walks in Home: Needs assistance Is this a change from baseline?: Change from baseline, expected to last <3 days Does the patient have difficulty walking or climbing stairs?: No Weakness of Legs: Both Weakness of Arms/Hands: Both  Permission Sought/Granted                  Emotional Assessment  Appearance:: Appears stated age Attitude/Demeanor/Rapport: Engaged Affect (typically observed): Calm Orientation: : Oriented to Place,Oriented to Self,Oriented to  Time,Oriented to Situation Alcohol / Substance Use: Not Applicable Psych Involvement: No (comment)  Admission diagnosis:  Rectal cancer ([place]) [C20] Patient Active Problem List   Diagnosis Date Noted  . Rectal cancer ([place]) 03/05/2021  . Port-A-Cath in place  12/10/2020  . Rectal carcinoma ([place]) 07/18/2020  . Diarrhea 07/09/2020  . Rectal bleeding 07/09/2020  . Iron deficiency anemia 07/04/2020  . Rectal mass 07/04/2020  . Goals of care, counseling/discussion 07/04/2020  . S/P carpal tunnel release 01/30/2020  . Right carpal tunnel syndrome 09/19/2018  . 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 Abnormality of gait 02/23/2018  . Degenerative cervical disc 03/24/2016  . 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 Routine general medical examination at a health care facility 09/02/2015  . Numbness and tingling in hands 08/27/2015   PCP:  Marrian Salvage, [place] Pharmacy:   [facility] [place] [place] [address] Phone: [phone] Fax: [phone]  [facility] [place], [place] [address] Phone: [phone] Fax: [phone]  [place], [place], [place] - [address] [place] [address] Phone: [phone] Fax: [place], [place] [address] Phone: [phone] Fax: [phone]  [place], [place] [address] Phone: [phone] Fax: [place] [address] Phone: [phone] Fax: [phone]  [facility] - [place], [place] [address] [place] [address] Phone: [phone] Fax: [phone]     Social Determinants of Health (SDOH) Interventions    Readmission Risk Interventions No flowsheet data found.

## 2021-03-07 NOTE — Progress Notes (Addendum)
2 Days Post-Op Robotic LAR and colostomy  Subjective: Having worsening tachycardia overnight despite fluid bolus given.  Transferred to SDU.  UOP and BP picking up.    Objective: Vital signs in last 24 hours: Temp:  [97.5 F (36.4 C)-98.3 F (36.8 C)] 97.9 F (36.6 C) (04/29 0800) Pulse Rate:  [90-133] 132 (04/29 0609) Resp:  [20-34] 33 (04/29 0609) BP: (75-112)/(38-57) 112/51 (04/29 0609) SpO2:  [93 %-100 %] 95 % (04/29 0609) Weight:  [92.2 kg] 92.2 kg (04/29 0500)   Intake/Output from previous day: 04/28 0701 - 04/29 0700 In: 3405.2 [I.V.:2611.1; IV Piggyback:794.1] Out: 1194 [Urine:650; Drains:520; Stool:650] Intake/Output this shift: No intake/output data recorded.   General appearance: alert and mild distress GI: normal findings: soft, appropriately tender Ostomy: beefy red Incision: no significant drainage JP: dark bloody output  Lab Results:  Recent Labs    03/06/21 0424 03/07/21 0513  WBC 3.0* 7.8  HGB 13.1 11.5*  HCT 38.4* 34.1*  PLT 227 140*   BMET Recent Labs    03/06/21 0424 03/07/21 0513  NA 139 137  K 4.5 4.9  CL 113* 111  CO2 16* 15*  GLUCOSE 144* 66*  BUN 22 38*  CREATININE 1.91* 2.81*  CALCIUM 8.2* 7.9*   PT/INR No results for input(s): LABPROT, INR in the last 72 hours. ABG No results for input(s): PHART, HCO3 in the last 72 hours.  Invalid input(s): PCO2, PO2  MEDS, Scheduled  alvimopan  12 mg Oral BID   bacitracin   Topical BID   Chlorhexidine Gluconate Cloth  6 each Topical Daily   dextrose  1 ampule Intravenous Once   enoxaparin (LOVENOX) injection  40 mg Subcutaneous Q24H   feeding supplement  237 mL Oral BID BM   gabapentin  300 mg Oral BID   mouth rinse  15 mL Mouth Rinse BID   saccharomyces boulardii  250 mg Oral BID    Studies/Results: DG Abd 1 View  Result Date: 03/05/2021 CLINICAL DATA:  Possible retained foreign body. EXAM: ABDOMEN - 1 VIEW; DG C-ARM 1-60 MIN-NO REPORT FLUOROSCOPY TIME:  3 seconds. COMPARISON:   None. FINDINGS: Four intraoperative fluoroscopic images were obtained of the abdomen. These demonstrate nasogastric tube tip in expected position of proximal stomach. There appears to be a surgical drain in the pelvis. There is no definite evidence of other radiopaque foreign body. IMPRESSION: Surgical drain seen in the pelvis. Nasogastric tube tip seen in expected position of proximal stomach. No other definite foreign body is noted. Electronically Signed   By: Marijo Conception M.D.   On: 03/05/2021 16:22   DG CHEST PORT 1 VIEW  Result Date: 03/07/2021 CLINICAL DATA:  Decreased oxygen saturation EXAM: PORTABLE CHEST 1 VIEW COMPARISON:  Chest CT October 31, 2020 FINDINGS: Port-A-Cath tip is in the superior vena cava. No appreciable pneumothorax. There are pleural effusions bilaterally with bibasilar atelectasis. Heart size and pulmonary vascular normal. No adenopathy. There is aortic atherosclerosis. No bone lesions. There are scattered areas of subcutaneous emphysema on each side. IMPRESSION: Scattered areas of subcutaneous emphysema without demonstrable pneumothorax. Port-A-Cath tip in superior vena cava. Pleural effusions bilaterally with bibasilar atelectasis. Heart size normal. Aortic Atherosclerosis (ICD10-I70.0). Electronically Signed   By: Lowella Grip III M.D.   On: 03/07/2021 08:02   DG C-Arm 1-60 Min-No Report  Result Date: 03/05/2021 CLINICAL DATA:  Possible retained foreign body. EXAM: ABDOMEN - 1 VIEW; DG C-ARM 1-60 MIN-NO REPORT FLUOROSCOPY TIME:  3 seconds. COMPARISON:  None. FINDINGS: Four intraoperative fluoroscopic  images were obtained of the abdomen. These demonstrate nasogastric tube tip in expected position of proximal stomach. There appears to be a surgical drain in the pelvis. There is no definite evidence of other radiopaque foreign body. IMPRESSION: Surgical drain seen in the pelvis. Nasogastric tube tip seen in expected position of proximal stomach. No other definite foreign  body is noted. Electronically Signed   By: Marijo Conception M.D.   On: 03/05/2021 16:22    Assessment: s/p Procedure(s): XI ROBOTIC ASSISTED LOWER ANTERIOR RESECTION WITH MOBILIZATION OF THE SPLENIC FLEXURE OF THE COLON AND END COLOSTOMY OSTOMY Patient Active Problem List   Diagnosis Date Noted   Rectal cancer ([place]) 03/05/2021   Port-A-Cath in place 12/10/2020   Rectal carcinoma ([place]) 07/18/2020   Diarrhea 07/09/2020   Rectal bleeding 07/09/2020   Iron deficiency anemia 07/04/2020   Rectal mass 07/04/2020   Goals of care, counseling/discussion 07/04/2020   S/P carpal tunnel release 01/30/2020   Right carpal tunnel syndrome 09/19/2018   Osteoarthritis of spine with radiculopathy, cervical region 09/05/2018   Abnormality of gait 02/23/2018   Degenerative cervical disc 03/24/2016   Degenerative arthritis of knee, bilateral 10/24/2015   Routine general medical examination at a health care facility 09/02/2015   Numbness and tingling in hands 08/27/2015    Significant post surgical inflammatory response.  Will support with IVF's and pain control at this time and monitor.    Plan: Ferrel Logan of clears today Acute kidney injury: due to dehydration and surgery. Cont IVF's, switched to 0.9NS due to elevated Cr and K.  Given better UOP and Bp, will decrease to 124ml/h Elevated Cr: appears to be due to dehydration and post op hypotension, K 4.9, will hydrate and follow closely, expect CR to start to level off now that UOP better Cont foley to monitor UOP PT for help with ambulation once BP and HR improves Ostomy teaching when able D50 prn for hypoglycemia  Discussed care with bedside RN and patient's wife in detail   LOS: 2 days     .Rosario Adie, MD [facility], [place]    03/07/2021 9:00 AM

## 2021-03-07 NOTE — Progress Notes (Signed)
Hypoglycemic Event  CBG: 53 Treatment: 25G Dextrose 50% per protocol  Symptoms: Clammy  Follow-up CBG: Time: 0820 CBG Result:93  Possible Reasons for Event: Not taking anything PO  Comments/MD notified: Paged general surgery   Ida Rogue

## 2021-03-07 NOTE — Progress Notes (Signed)
[place] Red (see flowsheet) at 2118. Pt previously red mews so vitals continued q4 per protocol. Rapid RN called to update and Rapid advised to try to take vitals q 2 if possible, and to call back if systolic gets in the 61Y. BP 90/48 at 0128, on call [facility] paged, UOP 200 ml despite multiple bolus during dayshift and continuous fluids @200ml /hr. Pt drowsy. New orders to transfer to stepdown.

## 2021-03-07 NOTE — Progress Notes (Signed)
Hypoglycemic Event  CBG: 69 Treatment:12.5 g Dextrose 50%  Symptoms:None  Follow-up CBG: Time:1714 CBG Result:99  Possible Reasons for Event: Patient not taking POs      [employer]

## 2021-03-07 NOTE — Progress Notes (Signed)
PT Cancellation Note  Patient Details Name: Shane [name] MRN: [id number] DOB: 1950-12-18   Cancelled Treatment:    Reason Eval/Treat Not Completed: Medical issues which prohibited therapy; pt with decr BP, elevated HR and RR, defer PT at this time and continue efforts   [facility] 03/07/2021, 9:54 AM

## 2021-03-08 ENCOUNTER — Inpatient Hospital Stay (HOSPITAL_COMMUNITY): Payer: Medicare Other

## 2021-03-08 DIAGNOSIS — R0902 Hypoxemia: Secondary | ICD-10-CM

## 2021-03-08 DIAGNOSIS — N179 Acute kidney failure, unspecified: Secondary | ICD-10-CM

## 2021-03-08 DIAGNOSIS — R918 Other nonspecific abnormal finding of lung field: Secondary | ICD-10-CM

## 2021-03-08 DIAGNOSIS — K648 Other hemorrhoids: Secondary | ICD-10-CM | POA: Insufficient documentation

## 2021-03-08 DIAGNOSIS — E7800 Pure hypercholesterolemia, unspecified: Secondary | ICD-10-CM

## 2021-03-08 DIAGNOSIS — N471 Phimosis: Secondary | ICD-10-CM

## 2021-03-08 LAB — COMPREHENSIVE METABOLIC PANEL
ALT: 30 U/L (ref 0–44)
AST: 41 U/L (ref 15–41)
Albumin: 2.5 g/dL — ABNORMAL LOW (ref 3.5–5.0)
Alkaline Phosphatase: 62 U/L (ref 38–126)
Anion gap: 9 (ref 5–15)
BUN: 49 mg/dL — ABNORMAL HIGH (ref 8–23)
CO2: 17 mmol/L — ABNORMAL LOW (ref 22–32)
Calcium: 8.3 mg/dL — ABNORMAL LOW (ref 8.9–10.3)
Chloride: 111 mmol/L (ref 98–111)
Creatinine, Ser: 2.97 mg/dL — ABNORMAL HIGH (ref 0.61–1.24)
GFR, Estimated: 22 mL/min — ABNORMAL LOW (ref >60)
Glucose, Bld: 99 mg/dL (ref 70–99)
Potassium: 4.2 mmol/L (ref 3.5–5.1)
Sodium: 137 mmol/L (ref 135–145)
Total Bilirubin: 1.1 mg/dL (ref 0.3–1.2)
Total Protein: 5.4 g/dL — ABNORMAL LOW (ref 6.5–8.1)

## 2021-03-08 LAB — BLOOD GAS, ARTERIAL
Acid-base deficit: 9.3 mmol/L — ABNORMAL HIGH (ref 0.0–2.0)
Bicarbonate: 14.6 mmol/L — ABNORMAL LOW (ref 20.0–28.0)
Drawn by: 270211
FIO2: 32
O2 Content: 3 L/min
O2 Saturation: 96.7 %
Patient temperature: 98.6
pCO2 arterial: 26.4 mmHg — ABNORMAL LOW (ref 32.0–48.0)
pH, Arterial: 7.362 (ref 7.350–7.450)
pO2, Arterial: 93.2 mmHg (ref 83.0–108.0)

## 2021-03-08 LAB — CBC
HCT: 29.9 % — ABNORMAL LOW (ref 39.0–52.0)
Hemoglobin: 10.4 g/dL — ABNORMAL LOW (ref 13.0–17.0)
MCH: 29.9 pg (ref 26.0–34.0)
MCHC: 34.8 g/dL (ref 30.0–36.0)
MCV: 85.9 fL (ref 80.0–100.0)
Platelets: 137 10*3/uL — ABNORMAL LOW (ref 150–400)
RBC: 3.48 MIL/uL — ABNORMAL LOW (ref 4.22–5.81)
RDW: 14.2 % (ref 11.5–15.5)
WBC: 6.8 10*3/uL (ref 4.0–10.5)
nRBC: 0.4 % — ABNORMAL HIGH (ref 0.0–0.2)

## 2021-03-08 LAB — GLUCOSE, CAPILLARY
Glucose-Capillary: 75 mg/dL (ref 70–99)
Glucose-Capillary: 76 mg/dL (ref 70–99)
Glucose-Capillary: 88 mg/dL (ref 70–99)
Glucose-Capillary: 92 mg/dL (ref 70–99)
Glucose-Capillary: 93 mg/dL (ref 70–99)
Glucose-Capillary: 95 mg/dL (ref 70–99)

## 2021-03-08 LAB — MAGNESIUM: Magnesium: 1.8 mg/dL (ref 1.7–2.4)

## 2021-03-08 LAB — PREALBUMIN: Prealbumin: 5 mg/dL — ABNORMAL LOW (ref 18–38)

## 2021-03-08 LAB — PHOSPHORUS: Phosphorus: 3.9 mg/dL (ref 2.5–4.6)

## 2021-03-08 MED ORDER — FENTANYL CITRATE (PF) 100 MCG/2ML IJ SOLN
25.0000 ug | INTRAMUSCULAR | Status: DC | PRN
  Administered 2021-03-08 – 2021-03-09 (×6): 50 ug via INTRAVENOUS
  Administered 2021-03-09: 25 ug via INTRAVENOUS
  Administered 2021-03-09 – 2021-03-12 (×12): 50 ug via INTRAVENOUS
  Filled 2021-03-08 (×20): qty 2

## 2021-03-08 MED ORDER — ALBUMIN HUMAN 5 % IV SOLN
25.0000 g | Freq: Once | INTRAVENOUS | Status: AC
  Administered 2021-03-08: 25 g via INTRAVENOUS
  Filled 2021-03-08: qty 500

## 2021-03-08 MED ORDER — MAGIC MOUTHWASH
15.0000 mL | Freq: Four times a day (QID) | ORAL | Status: DC | PRN
  Filled 2021-03-08: qty 15

## 2021-03-08 MED ORDER — SIMETHICONE 80 MG PO CHEW
80.0000 mg | CHEWABLE_TABLET | Freq: Four times a day (QID) | ORAL | Status: DC
  Administered 2021-03-08 (×4): 80 mg via ORAL
  Filled 2021-03-08 (×5): qty 1

## 2021-03-08 MED ORDER — ACETAMINOPHEN 500 MG PO TABS
1000.0000 mg | ORAL_TABLET | Freq: Four times a day (QID) | ORAL | Status: DC
  Administered 2021-03-08 (×3): 1000 mg via ORAL
  Filled 2021-03-08 (×5): qty 2

## 2021-03-08 MED ORDER — METOPROLOL TARTRATE 5 MG/5ML IV SOLN
2.5000 mg | Freq: Four times a day (QID) | INTRAVENOUS | Status: DC
  Administered 2021-03-08 – 2021-03-10 (×9): 2.5 mg via INTRAVENOUS
  Filled 2021-03-08 (×11): qty 5

## 2021-03-08 MED ORDER — HEPARIN SODIUM (PORCINE) 5000 UNIT/ML IJ SOLN
5000.0000 [IU] | Freq: Three times a day (TID) | INTRAMUSCULAR | Status: DC
  Administered 2021-03-08 – 2021-03-10 (×9): 5000 [IU] via SUBCUTANEOUS
  Filled 2021-03-08 (×10): qty 1

## 2021-03-08 MED ORDER — PIPERACILLIN-TAZOBACTAM 3.375 G IVPB
3.3750 g | Freq: Three times a day (TID) | INTRAVENOUS | Status: AC
  Administered 2021-03-08 – 2021-03-12 (×15): 3.375 g via INTRAVENOUS
  Filled 2021-03-08 (×16): qty 50

## 2021-03-08 MED ORDER — METHOCARBAMOL 1000 MG/10ML IJ SOLN
500.0000 mg | Freq: Four times a day (QID) | INTRAVENOUS | Status: DC
  Administered 2021-03-08 (×2): 500 mg via INTRAVENOUS
  Filled 2021-03-08 (×2): qty 500

## 2021-03-08 MED ORDER — LACTATED RINGERS IV BOLUS
1000.0000 mL | Freq: Three times a day (TID) | INTRAVENOUS | Status: DC | PRN
  Administered 2021-03-08: 1000 mL via INTRAVENOUS

## 2021-03-08 MED ORDER — DIPHENHYDRAMINE HCL 50 MG/ML IJ SOLN
12.5000 mg | Freq: Four times a day (QID) | INTRAMUSCULAR | Status: DC | PRN
  Administered 2021-03-11 – 2021-03-12 (×3): 25 mg via INTRAVENOUS
  Filled 2021-03-08 (×3): qty 1

## 2021-03-08 MED ORDER — HALOPERIDOL LACTATE 5 MG/ML IJ SOLN
2.0000 mg | Freq: Four times a day (QID) | INTRAMUSCULAR | Status: DC | PRN
  Administered 2021-03-10: 2.5 mg via INTRAVENOUS
  Administered 2021-03-11: 5 mg via INTRAVENOUS
  Filled 2021-03-08 (×2): qty 1

## 2021-03-08 MED ORDER — LIP MEDEX EX OINT
1.0000 "application " | TOPICAL_OINTMENT | Freq: Two times a day (BID) | CUTANEOUS | Status: DC
  Administered 2021-03-08 – 2021-04-11 (×68): 1 via TOPICAL
  Filled 2021-03-08 (×6): qty 7

## 2021-03-08 MED ORDER — METHOCARBAMOL 1000 MG/10ML IJ SOLN
500.0000 mg | Freq: Four times a day (QID) | INTRAVENOUS | Status: DC | PRN
  Filled 2021-03-08: qty 5

## 2021-03-08 MED ORDER — CALCIUM POLYCARBOPHIL 625 MG PO TABS
625.0000 mg | ORAL_TABLET | Freq: Two times a day (BID) | ORAL | Status: DC
  Administered 2021-03-08: 625 mg via ORAL
  Filled 2021-03-08: qty 1

## 2021-03-08 MED ORDER — PROCHLORPERAZINE EDISYLATE 10 MG/2ML IJ SOLN: 5.0000 mg | INTRAMUSCULAR | Status: DC | PRN

## 2021-03-08 MED ORDER — ACETAMINOPHEN 160 MG/5ML PO SOLN
500.0000 mg | Freq: Four times a day (QID) | ORAL | Status: DC | PRN
  Administered 2021-03-08: 500 mg via ORAL
  Filled 2021-03-08: qty 20.3

## 2021-03-08 MED ORDER — ALBUTEROL SULFATE (2.5 MG/3ML) 0.083% IN NEBU
2.5000 mg | INHALATION_SOLUTION | Freq: Four times a day (QID) | RESPIRATORY_TRACT | Status: DC | PRN
  Administered 2021-03-09: 2.5 mg via RESPIRATORY_TRACT
  Filled 2021-03-08: qty 3

## 2021-03-08 MED ORDER — SODIUM BICARBONATE 8.4 % IV SOLN
INTRAVENOUS | Status: DC
Start: 2021-03-08 — End: 2021-03-11
  Filled 2021-03-08 (×3): qty 150
  Filled 2021-03-08: qty 1000
  Filled 2021-03-08: qty 150

## 2021-03-08 NOTE — Progress Notes (Addendum)
Shane [name] [id number] 11-05-1951  CARE TEAM:  PCP: Olive Bass, FNP  Outpatient Care Team: Patient Care Team: Olive Bass, FNP as PCP - General (Internal Medicine) Truett Perna Leighton Roach, MD as Consulting Physician (Oncology) Radonna Ricker, RN as Oncology Nurse Navigator Heilingoetter, Johnette Abraham, PA-C as Physician Assistant (Physician Assistant) Romie Levee, MD as Consulting Physician (General Surgery) Serena Colonel, MD as Consulting Physician (Otolaryngology) Shellia Cleverly, DO as Consulting Physician (Gastroenterology)  Inpatient Treatment Team: Treatment Team: Attending Provider: Romie Levee, MD; Technician: Harlow Ohms, NT; WOC Nurse: Teressa Lower Bonney Aid, RN; Consulting Physician: Karie Soda, MD; Technician: Spero Curb, NT; Registered Nurse: Anthoney Harada, RN (Inactive); Physical Therapist: Desmond Dike, PT; Registered Nurse: Micki Riley, RN; Utilization Review: Clydia Llano, RN; Technician: Birdie Hopes, NT   Problem List:   Principal Problem:   Rectal carcinoma [facility]) Active Problems:   Degenerative cervical disc   Abnormality of gait   Iron deficiency anemia   Port-A-Cath in place   AKI (acute kidney injury) (HCC)   Hemorrhoids, internal   Hypercholesteremia   Pulmonary nodules   Hypoxia   3 Days Post-Op   03/05/2021  POST-OPERATIVE DIAGNOSIS:  RECTAL CANCER  PROCEDURE:   XI ROBOTIC ASSISTED LOWER ANTERIOR RESECTION WITH END COLOSTOMY MOBILIZATION OF THE SPLENIC FLEXURE    SURGEON: Romie Levee, MD  03/05/2021 Postoperative diagnosis: Unable to pass catheter due to tight foreskin  Surgery: Examination under anesthesia; spreading of phimosis with hemostat; insertion of catheter  Surgeon: Dr. Lorin Picket Macdiarmid     Assessment  GUARDED  Minimally Invasive Surgery Hospital Stay = 3 days)  Plan:  -sips for now.  NGT if worse.  Simethicone for bloating.  -Hypoxia/tachypnea from abdominal  distention.  Doubt pneumonia.  No severe hypoxia.  Should resolve as ileus resolves.  Cannot diurese w inc Cr.  Consult medicine/pulmonary if not better by tomorrow or worse.  He claims he is breathing better this morning so follow closely.  -keep in ICU  -IV ABx w fever - follow.  WBC WNL still.  He has no peritonitis and drainage serosanguinous = not c/w leak/delated perforation.  Follow closely on IV ABx.  CT scan to r/o abscess if not better by POD #5  -IVF.  1/2 dose w bolus backup since ++I/O with hypoxia  -AKI - low but not oliguric.  Cr peaking - follow  -Confusion from prolonged anesthesia.  slowly resolving.  Tired but oriented.  Seems less sedated on fentanyl.  Continue scheduled Tylenol.  PRN Robaxin, low-dose pain control.  -tachycardia - try low dose metoprolol IF BP remains better -right low threshold to hold it blood pressure fair.  Cardiology consul if goes into A. fib.  We will see.  -VTE prophylaxis- SCDs, etc.  Switch from enoxaparin to heparin w inc Cr  -Pathology from resection  -mobilize as tolerated to help recovery  Disposition:  Disposition:  The patient is from: Home  Anticipate discharge to:  Skilled Nursing Facility (SNF)  Anticipated Date of Discharge is:  May 4,2022   Barriers to discharge:  Pending Clinical improvement (more likely than not)  Patient currently is NOT MEDICALLY STABLE for discharge from the hospital from a surgery standpoint.    I updated the patient's status to the patient and nurse  Recommendations were made.  Questions were answered.  They expressed understanding & appreciation.  Discussed with my partner, Dr. Fredricka Bonine, who is on call for the rest the day.  Surgery will continue to follow very  closely.   35 minutes spent in review, evaluation, examination, counseling, and coordination of care.   I have reviewed this patient's available data, including medical history, events of note, physical examination and test results as part of  my evaluation.  A significant portion of that time was spent in counseling.  Care during the described time interval was provided by me.  03/08/2021    Subjective: (Chief complaint)  Hypoxic corrected on 2L [place] oxygen.  Concern for a fever spike.  Antibiotics started.  Feels less confused.  Denies much pain but seems uncomfortable.  Likes having sips of clears.  Denies any nausea.  No belching.  ICU nursing just outside room, rounding  Objective:  Vital signs:  Vitals:   03/08/21 0405 03/08/21 0500 03/08/21 0600 03/08/21 0700  BP: (!) 96/59 (!) 107/49 105/67 113/81  Pulse: (!) 110  (!) 124 (!) 116  Resp: (!) 39 (!) 42 (!) 41 (!) 29  Temp:      TempSrc:      SpO2: 98%  96% (!) 73%  Weight:        Last BM Date: 03/07/21  Intake/Output   Yesterday:  04/29 0701 - 04/30 0700 In: 3186.5 [I.V.:3087.3; IV Piggyback:99.3] Out: 870 [Urine:730; Drains:90; Stool:50] This shift:  No intake/output data recorded.  Bowel function:  Flatus: No  BM:  No  Drain: Serosanguinous   Physical Exam:  General: Pt awake/alert in mild acute distress.  Sleeping but awakens.  Tired but not toxic Eyes: PERRL, normal EOM.  Sclera clear.  No icterus Neuro: CN II-XII intact w/o focal sensory/motor deficits. Lymph: No head/neck/groin lymphadenopathy Psych:  No delerium/psychosis/paranoia.  Oriented x 4 HENT: Normocephalic, Mucus membranes moist.  No thrush Neck: Supple, No tracheal deviation.  No obvious thyromegaly Chest: No pain to chest wall compression.  Fair respiratory excursion.  No audible wheezing CV:  Pulses intact.  Regular rhythm.  No major extremity edema MS: Normal AROM mjr joints.  No obvious deformity  Abdomen: Somewhat firm.  Moderately distended.  Mild tenderness to deep palpation only.  No rebound tenderness or guarding.  No pain with cough.  NO PERITONITIS.  Ostomy edematous but viable.  Scant stool in bag.  Dressings clean.  I removed it.  Plan skin is clean dry  intact with no purulence or bleeding.  No cellulitis..  No evidence of peritonitis.  No incarcerated hernias.  GU: Foley in place.  Bilateral large scrotal hydroceles stable.  No hernias. Ext:  No deformity.  No mjr edema.  No cyanosis Skin: No petechiae / purpurea.  No major sores.  Warm and dry    Results:   Cultures: Recent Results (from the past 720 hour(s))  SARS CORONAVIRUS 2 (TAT 6-24 HRS) Nasopharyngeal Nasopharyngeal Swab     Status: None   Collection Time: 03/03/21 11:13 AM   Specimen: Nasopharyngeal Swab  Result Value Ref Range Status   SARS Coronavirus 2 NEGATIVE NEGATIVE Final    Comment: (NOTE) SARS-CoV-2 target nucleic acids are NOT DETECTED.  The SARS-CoV-2 RNA is generally detectable in upper and lower respiratory specimens during the acute phase of infection. Negative results do not preclude SARS-CoV-2 infection, do not rule out co-infections with other pathogens, and should not be used as the sole basis for treatment or other patient management decisions. Negative results must be combined with clinical observations, patient history, and epidemiological information. The expected result is Negative.  Fact Sheet for Patients: SugarRoll.be  Fact Sheet for Healthcare Providers: https://www.woods-mathews.com/  This test is  not yet approved or cleared by the [place] and  has been authorized for detection and/or diagnosis of SARS-CoV-2 by FDA under an Emergency Use Authorization (EUA). This EUA will remain  in effect (meaning this test can be used) for the duration of the COVID-19 declaration under Se ction 564(b)(1) of the Act, 21 U.S.C. section 360bbb-3(b)(1), unless the authorization is terminated or revoked sooner.  Performed at [facility], [place] [address], [place], [place] [address]   MRSA PCR Screening     Status: None   Collection Time: 03/07/21  4:00 AM   Specimen: Nasopharyngeal  Result Value  Ref Range Status   MRSA by PCR NEGATIVE NEGATIVE Final    Comment:        The GeneXpert MRSA Assay (FDA approved for NASAL specimens only), is one component of a comprehensive MRSA colonization surveillance program. It is not intended to diagnose MRSA infection nor to guide or monitor treatment for MRSA infections. Performed at [facility], [place] [address], [place], [place] [address]     Labs: Results for orders placed or performed during the hospital encounter of 03/05/21 (from the past 48 hour(s))  Troponin I (High Sensitivity)     Status: Abnormal   Collection Time: 03/06/21  9:10 AM  Result Value Ref Range   Troponin I (High Sensitivity) 19 (H) <18 ng/L    Comment: (NOTE) Elevated high sensitivity troponin I (hsTnI) values and significant  changes across serial measurements may suggest ACS but many other  chronic and acute conditions are known to elevate hsTnI results.  Refer to the "Links" section for chest pain algorithms and additional  guidance. Performed at [facility], [place] [address], [place], [place] [address]   Troponin I (High Sensitivity)     Status: None   Collection Time: 03/06/21 11:09 AM  Result Value Ref Range   Troponin I (High Sensitivity) 16 <18 ng/L    Comment: (NOTE) Elevated high sensitivity troponin I (hsTnI) values and significant  changes across serial measurements may suggest ACS but many other  chronic and acute conditions are known to elevate hsTnI results.  Refer to the "Links" section for chest pain algorithms and additional  guidance. Performed at [facility], [place] [address] [place], [place] [address]   Glucose, capillary     Status: Abnormal   Collection Time: 03/07/21  3:44 AM  Result Value Ref Range   Glucose-Capillary 56 (L) 70 - 99 mg/dL    Comment: Glucose reference range applies only to samples taken after fasting for at least 8 hours.  MRSA PCR Screening     Status:  None   Collection Time: 03/07/21  4:00 AM   Specimen: Nasopharyngeal  Result Value Ref Range   MRSA by PCR NEGATIVE NEGATIVE    Comment:        The GeneXpert MRSA Assay (FDA approved for NASAL specimens only), is one component of a comprehensive MRSA colonization surveillance program. It is not intended to diagnose MRSA infection nor to guide or monitor treatment for MRSA infections. Performed at [facility], [place] [address] [place], [place] [address]   Glucose, capillary     Status: None   Collection Time: 03/07/21  4:05 AM  Result Value Ref Range   Glucose-Capillary 88 70 - 99 mg/dL    Comment: Glucose reference range applies only to samples taken after fasting for at least 8 hours.  CBC     Status:  Abnormal   Collection Time: 03/07/21  5:13 AM  Result Value Ref Range   WBC 7.8 4.0 - 10.5 K/uL   RBC 3.72 (L) 4.22 - 5.81 MIL/uL   Hemoglobin 11.5 (L) 13.0 - 17.0 g/dL   HCT 34.1 (L) 39.0 - 52.0 %   MCV 91.7 80.0 - 100.0 fL   MCH 30.9 26.0 - 34.0 pg   MCHC 33.7 30.0 - 36.0 g/dL   RDW 14.7 11.5 - 15.5 %   Platelets 140 (L) 150 - 400 K/uL    Comment: SPECIMEN CHECKED FOR CLOTS   nRBC 0.0 0.0 - 0.2 %    Comment: Performed at [facility], [place] [address], [place], [place] [id number]  Basic metabolic panel     Status: Abnormal   Collection Time: 03/07/21  5:13 AM  Result Value Ref Range   Sodium 137 135 - 145 mmol/L   Potassium 4.9 3.5 - 5.1 mmol/L   Chloride 111 98 - 111 mmol/L   CO2 15 (L) 22 - 32 mmol/L   Glucose, Bld 66 (L) 70 - 99 mg/dL    Comment: Glucose reference range applies only to samples taken after fasting for at least 8 hours.   BUN 38 (H) 8 - 23 mg/dL   Creatinine, Ser 2.81 (H) 0.61 - 1.24 mg/dL   Calcium 7.9 (L) 8.9 - 10.3 mg/dL   GFR, Estimated 23 (L) >60 mL/min    Comment: (NOTE) Calculated using the CKD-EPI Creatinine Equation (2021)    Anion gap 11 5 - 15    Comment: Performed at [facility],  [place] [address], [place], [place] [address]  Glucose, capillary     Status: Abnormal   Collection Time: 03/07/21  7:50 AM  Result Value Ref Range   Glucose-Capillary 53 (L) 70 - 99 mg/dL    Comment: Glucose reference range applies only to samples taken after fasting for at least 8 hours.  Glucose, capillary     Status: None   Collection Time: 03/07/21  8:23 AM  Result Value Ref Range   Glucose-Capillary 93 70 - 99 mg/dL    Comment: Glucose reference range applies only to samples taken after fasting for at least 8 hours.  Glucose, capillary     Status: None   Collection Time: 03/07/21 11:25 AM  Result Value Ref Range   Glucose-Capillary 76 70 - 99 mg/dL    Comment: Glucose reference range applies only to samples taken after fasting for at least 8 hours.  Glucose, capillary     Status: Abnormal   Collection Time: 03/07/21  4:50 PM  Result Value Ref Range   Glucose-Capillary 69 (L) 70 - 99 mg/dL    Comment: Glucose reference range applies only to samples taken after fasting for at least 8 hours.  Glucose, capillary     Status: None   Collection Time: 03/07/21  5:14 PM  Result Value Ref Range   Glucose-Capillary 99 70 - 99 mg/dL    Comment: Glucose reference range applies only to samples taken after fasting for at least 8 hours.  Glucose, capillary     Status: None   Collection Time: 03/07/21  8:04 PM  Result Value Ref Range   Glucose-Capillary 90 70 - 99 mg/dL    Comment: Glucose reference range applies only to samples taken after fasting for at least 8 hours.  Glucose, capillary     Status: None   Collection Time: 03/07/21 11:36 PM  Result Value Ref Range  Glucose-Capillary 86 70 - 99 mg/dL    Comment: Glucose reference range applies only to samples taken after fasting for at least 8 hours.  Glucose, capillary     Status: None   Collection Time: 03/08/21  3:42 AM  Result Value Ref Range   Glucose-Capillary 76 70 - 99 mg/dL    Comment: Glucose reference range applies only to  samples taken after fasting for at least 8 hours.  CBC     Status: Abnormal   Collection Time: 03/08/21  5:20 AM  Result Value Ref Range   WBC 6.8 4.0 - 10.5 K/uL   RBC 3.48 (L) 4.22 - 5.81 MIL/uL   Hemoglobin 10.4 (L) 13.0 - 17.0 g/dL   HCT 29.9 (L) 39.0 - 52.0 %   MCV 85.9 80.0 - 100.0 fL   MCH 29.9 26.0 - 34.0 pg   MCHC 34.8 30.0 - 36.0 g/dL   RDW 14.2 11.5 - 15.5 %   Platelets 137 (L) 150 - 400 K/uL    Comment: REPEATED TO VERIFY   nRBC 0.4 (H) 0.0 - 0.2 %    Comment: Performed at [facility], [place] [address], [place], [place] [address]  Comprehensive metabolic panel     Status: Abnormal   Collection Time: 03/08/21  5:20 AM  Result Value Ref Range   Sodium 137 135 - 145 mmol/L   Potassium 4.2 3.5 - 5.1 mmol/L   Chloride 111 98 - 111 mmol/L   CO2 17 (L) 22 - 32 mmol/L   Glucose, Bld 99 70 - 99 mg/dL    Comment: Glucose reference range applies only to samples taken after fasting for at least 8 hours.   BUN 49 (H) 8 - 23 mg/dL   Creatinine, Ser 2.97 (H) 0.61 - 1.24 mg/dL   Calcium 8.3 (L) 8.9 - 10.3 mg/dL   Total Protein 5.4 (L) 6.5 - 8.1 g/dL   Albumin 2.5 (L) 3.5 - 5.0 g/dL   AST 41 15 - 41 U/L   ALT 30 0 - 44 U/L   Alkaline Phosphatase 62 38 - 126 U/L   Total Bilirubin 1.1 0.3 - 1.2 mg/dL   GFR, Estimated 22 (L) >60 mL/min    Comment: (NOTE) Calculated using the CKD-EPI Creatinine Equation (2021)    Anion gap 9 5 - 15    Comment: Performed at [facility], [place] [address], [place], [place] [address]  Prealbumin     Status: Abnormal   Collection Time: 03/08/21  5:20 AM  Result Value Ref Range   Prealbumin 5.0 (L) 18 - 38 mg/dL    Comment: Performed at [facility], [place] [address] [place], [place] [address]  Magnesium     Status: None   Collection Time: 03/08/21  5:20 AM  Result Value Ref Range   Magnesium 1.8 1.7 - 2.4 mg/dL    Comment: Performed at [facility], [place] [address],  [place], [place] [address]  Phosphorus     Status: None   Collection Time: 03/08/21  5:20 AM  Result Value Ref Range   Phosphorus 3.9 2.5 - 4.6 mg/dL    Comment: Performed at [facility], [place] [address], [place], [place] [address]  Glucose, capillary     Status: None   Collection Time: 03/08/21  7:20 AM  Result Value Ref Range   Glucose-Capillary 75 70 - 99 mg/dL    Comment: Glucose reference range applies only to samples taken after fasting  for at least 8 hours.    Imaging / Studies: DG CHEST PORT 1 VIEW  Result Date: 03/07/2021 CLINICAL DATA:  Decreased oxygen saturation EXAM: PORTABLE CHEST 1 VIEW COMPARISON:  Chest CT October 31, 2020 FINDINGS: Port-A-Cath tip is in the superior vena cava. No appreciable pneumothorax. There are pleural effusions bilaterally with bibasilar atelectasis. Heart size and pulmonary vascular normal. No adenopathy. There is aortic atherosclerosis. No bone lesions. There are scattered areas of subcutaneous emphysema on each side. IMPRESSION: Scattered areas of subcutaneous emphysema without demonstrable pneumothorax. Port-A-Cath tip in superior vena cava. Pleural effusions bilaterally with bibasilar atelectasis. Heart size normal. Aortic Atherosclerosis (ICD10-I70.0). Electronically Signed   By: Lowella Grip III M.D.   On: 03/07/2021 08:02    Medications / Allergies: per chart  Antibiotics: Anti-infectives (From admission, onward)   Start     Dose/Rate Route Frequency Ordered Stop   03/08/21 0030  piperacillin-tazobactam (ZOSYN) IVPB 3.375 g        3.375 g 12.5 mL/hr over 240 Minutes Intravenous Every 8 hours 03/08/21 0016 03/13/21 0029   03/05/21 2200  cefoTEtan (CEFOTAN) 2 g in sodium chloride 0.9 % 100 mL IVPB        2 g 200 mL/hr over 30 Minutes Intravenous Every 12 hours 03/05/21 1841 03/05/21 2258   03/05/21 0730  cefoTEtan (CEFOTAN) 2 g in sodium chloride 0.9 % 100 mL IVPB        2 g 200 mL/hr over 30 Minutes Intravenous On call  to O.R. 03/05/21 2992 03/05/21 0926        Note: Portions of this report may have been transcribed using voice recognition software. Every effort was made to ensure accuracy; however, inadvertent computerized transcription errors may be present.   Any transcriptional errors that result from this process are unintentional.    Adin Hector, MD, FACS, MASCRS  Esophageal, Gastrointestinal & Colorectal Surgery Robotic and Minimally Invasive Surgery [facility] [address], [place], [place] [address] [phone] Fax [phone] Main/Paging  CONTACT INFORMATION: Weekday (9AM-5PM) concerns: Call [facility] main office at [phone] Weeknight (5PM-9AM) or Weekend/Holiday concerns: Check [email or web address] for General Surgery [facility] coverage (Please, do not use SecureChat as it is not reliable communication to operating surgeons for immediate patient care)      03/08/2021  7:41 AM

## 2021-03-08 NOTE — Evaluation (Addendum)
Physical Therapy Evaluation Patient Details Name: Shane [name] MRN: [id number] DOB: November 07, 1951 Today's Date: 03/08/2021   History of Present Illness  The patient is a 70 year old male who presents with colorectal cancer, in  ED 03/05/21 with complaints of hemorrhoids and rectal bleeding, anemia. CT showed a large rectal mass. S/P ROBOTIC ASSISTED LOWER ANTERIOR RESECTION WITH END COLOSTOMY  MOBILIZATION OF THE SPLENIC FLEXURE  on 03/05/21, post op tachycardia and hypotension.  Clinical Impression  Evaluation limited today. Patient resting in bed , RR hi 30-40's. BP supine(HOB40*) on 2 L [place], 95/67. HR 115.  Bed placed in semi chair position with  Feet dependent, sitting upright in bed. BP 72/54. RR 38-40. Noted dyspnea. Patient returned to more supine position, legs back level.  BP 102/58. RN notified. Pt admitted with above diagnosis. Pt currently with functional limitations due to the deficits listed below (see PT Problem List). Pt will benefit from skilled PT to increase their independence and safety with mobility to allow discharge to the venue listed below.       Follow Up Recommendations SNF, unless progresses well.    Equipment Recommendations  Rolling walker with 5" wheels    Recommendations for Other Services       Precautions / Restrictions Precautions Precautions: Fall Precaution Comments: colostomy, scrotal edema, JP drain on left abd, Hypotension, Rapid RR      Mobility  Bed Mobility               General bed mobility comments: bed placed in chair position. patient only performed short kicks. BP dropped into 70's so moved  back into supine position.    Transfers                 General transfer comment: NT  Ambulation/Gait                Stairs            Wheelchair Mobility    Modified Rankin (Stroke Patients Only)       Balance                                             Pertinent Vitals/Pain Pain  Assessment: Faces Faces Pain Scale: Hurts little more Pain Location: abdomen Pain Descriptors / Indicators: Discomfort Pain Intervention(s): Monitored during session;Limited activity within patient's tolerance    Home Living Family/patient expects to be discharged to:: Private residence Living Arrangements: Spouse/significant other Available Help at Discharge: Family Type of Home: House Home Access: Stairs to enter   Technical brewer of Steps: 3 Home Layout: One level Home Equipment: None      Prior Function Level of Independence: Independent               Hand Dominance   Dominant Hand: Right    Extremity/Trunk Assessment   Upper Extremity Assessment Upper Extremity Assessment: Generalized weakness    Lower Extremity Assessment Lower Extremity Assessment: Generalized weakness (limited hip flexion with abd pain increased.)    Cervical / Trunk Assessment Cervical / Trunk Assessment: Other exceptions Cervical / Trunk Exceptions: did not sit upright, limited assessment  Communication   Communication: Other (comment);No difficulties  Cognition Arousal/Alertness: Awake/alert Behavior During Therapy: Anxious;WFL for tasks assessed/performed Overall Cognitive Status: Within Functional Limits for tasks assessed  General Comments: somewhat anxious with RR  up to 40.      General Comments General comments (skin integrity, edema, etc.): unabler to test due to BP dropped with seated upright in bed.    Exercises General Exercises - Upper Extremity Shoulder Flexion: AROM;Both;10 reps;Supine General Exercises - Lower Extremity Ankle Circles/Pumps: AROM;Both;10 reps;Supine Short Arc Quad: AROM;Both;10 reps;Supine Heel Slides: AAROM;5 reps;Both;Supine   Assessment/Plan    PT Assessment Patient needs continued PT services  PT Problem List Decreased strength;Decreased mobility;Decreased safety awareness;Decreased  activity tolerance;Cardiopulmonary status limiting activity;Decreased balance;Decreased knowledge of use of DME;Pain       PT Treatment Interventions DME instruction;Therapeutic activities;Gait training;Therapeutic exercise;Patient/family education;Functional mobility training;Stair training    PT Goals (Current goals can be found in the Care Plan section)  Acute Rehab PT Goals Patient Stated Goal: i want to some more, get up PT Goal Formulation: With patient Time For Goal Achievement: 03/22/21 Potential to Achieve Goals: Fair    Frequency Min 3X/week   Barriers to discharge        Co-evaluation               AM-PAC PT "6 Clicks" Mobility  Outcome Measure Help needed turning from your back to your side while in a flat bed without using bedrails?: Total Help needed moving from lying on your back to sitting on the side of a flat bed without using bedrails?: Total Help needed moving to and from a bed to a chair (including a wheelchair)?: Total Help needed standing up from a chair using your arms (e.g., wheelchair or bedside chair)?: Total Help needed to walk in hospital room?: Total Help needed climbing 3-5 steps with a railing? : Total 6 Click Score: 6    End of Session   Activity Tolerance: Treatment limited secondary to medical complications (Comment) Patient left: in bed;with call bell/phone within reach;with bed alarm set Nurse Communication: Mobility status (BP dropped) PT Visit Diagnosis: Unsteadiness on feet (R26.81);Difficulty in walking, not elsewhere classified (R26.2);Pain;Other abnormalities of gait and mobility (R26.89)    Time: 1110-1145 PT Time Calculation (min) (ACUTE ONLY): 35 min   Charges:   PT Evaluation $PT Eval Low Complexity: 1 Low PT Treatments $Therapeutic Activity: 8-22 mins        Tresa Endo PT Acute Rehabilitation Services Pager [phone] Office [phone]   Claretha Cooper 03/08/2021, 1:52 PM

## 2021-03-08 NOTE — Progress Notes (Signed)
Pharmacy Antibiotic Note  Shane [name] is a 70 y.o. male admitted on 03/05/2021 with rectal cancer s/p colectomy.  Pharmacy has been consulted for Zosyn dosing.  Plan: Zosyn 3.375g IV q8h (4 hour infusion).  Monitor renal function and cx data    Weight: 92.2 kg (203 lb 4.2 oz)  Temp (24hrs), Avg:98.3 F (36.8 C), Min:97.7 F (36.5 C), Max:99.8 F (37.7 C)  Recent Labs  Lab 03/06/21 0424 03/07/21 0513  WBC 3.0* 7.8  CREATININE 1.91* 2.81*    Estimated Creatinine Clearance: 27.5 mL/min (A) (by C-G formula based on SCr of 2.81 mg/dL (H)).    Allergies  Allergen Reactions  . Nsaids Other (See Comments)    Kidney disease    Antimicrobials this admission: 4/30 Zosyn >>    Dose adjustments this admission:  Microbiology results:   Thank you for allowing pharmacy to be a part of this patient's care.  Netta Cedars PharmD 03/08/2021 12:27 AM

## 2021-03-08 NOTE — Progress Notes (Addendum)
Surgery MD on call was paged regarding concern for pt current status.   Pt tachycardic HR 110-130's when resting. RR of 30- high 40's on 2 L [place]. Diminished lung sounds. Patietn was oriented to only self. He developed fever 101.4. crepitus noted for the first time on bilateral upper chest cavity, abd distended and firm. No pain at this moment.   Per MD order, pt was given tylenol and started on abx zosyn. maintenance fluid was decreased and robaxin was also scheduled. No plan for imaging at this time.

## 2021-03-08 NOTE — Progress Notes (Signed)
Pt stated seeing hallucinations. Pt labored breathing. Pt SOB when speaking. General surgery notified of pt's condition.

## 2021-03-09 ENCOUNTER — Inpatient Hospital Stay (HOSPITAL_COMMUNITY): Payer: Medicare Other

## 2021-03-09 DIAGNOSIS — R0682 Tachypnea, not elsewhere classified: Secondary | ICD-10-CM

## 2021-03-09 DIAGNOSIS — C20 Malignant neoplasm of rectum: Secondary | ICD-10-CM | POA: Diagnosis not present

## 2021-03-09 DIAGNOSIS — N179 Acute kidney failure, unspecified: Secondary | ICD-10-CM

## 2021-03-09 DIAGNOSIS — R7989 Other specified abnormal findings of blood chemistry: Secondary | ICD-10-CM

## 2021-03-09 DIAGNOSIS — R0603 Acute respiratory distress: Secondary | ICD-10-CM | POA: Diagnosis not present

## 2021-03-09 DIAGNOSIS — R410 Disorientation, unspecified: Secondary | ICD-10-CM | POA: Diagnosis not present

## 2021-03-09 DIAGNOSIS — K567 Ileus, unspecified: Secondary | ICD-10-CM

## 2021-03-09 DIAGNOSIS — R609 Edema, unspecified: Secondary | ICD-10-CM | POA: Diagnosis not present

## 2021-03-09 LAB — GLUCOSE, CAPILLARY
Glucose-Capillary: 93 mg/dL (ref 70–99)
Glucose-Capillary: 93 mg/dL (ref 70–99)
Glucose-Capillary: 86 mg/dL (ref 70–99)
Glucose-Capillary: 88 mg/dL (ref 70–99)
Glucose-Capillary: 66 mg/dL — ABNORMAL LOW (ref 70–99)
Glucose-Capillary: 98 mg/dL (ref 70–99)

## 2021-03-09 LAB — BPAM RBC
Blood Product Expiration Date: 202205212359
Blood Product Expiration Date: 202205212359
Unit Type and Rh: 5100
Unit Type and Rh: 5100

## 2021-03-09 LAB — CBC
HCT: 23.7 % — ABNORMAL LOW (ref 39.0–52.0)
Hemoglobin: 8.6 g/dL — ABNORMAL LOW (ref 13.0–17.0)
MCH: 30.1 pg (ref 26.0–34.0)
MCHC: 36.3 g/dL — ABNORMAL HIGH (ref 30.0–36.0)
MCV: 82.9 fL (ref 80.0–100.0)
Platelets: 114 10*3/uL — ABNORMAL LOW (ref 150–400)
RBC: 2.86 MIL/uL — ABNORMAL LOW (ref 4.22–5.81)
RDW: 13.9 % (ref 11.5–15.5)
WBC: 6.1 10*3/uL (ref 4.0–10.5)
nRBC: 0.8 % — ABNORMAL HIGH (ref 0.0–0.2)

## 2021-03-09 LAB — TYPE AND SCREEN
ABO/RH(D): O POS
Antibody Screen: NEGATIVE
Unit division: 0
Unit division: 0

## 2021-03-09 LAB — BASIC METABOLIC PANEL
Anion gap: 11 (ref 5–15)
BUN: 60 mg/dL — ABNORMAL HIGH (ref 8–23)
CO2: 18 mmol/L — ABNORMAL LOW (ref 22–32)
Calcium: 8.7 mg/dL — ABNORMAL LOW (ref 8.9–10.3)
Chloride: 112 mmol/L — ABNORMAL HIGH (ref 98–111)
Creatinine, Ser: 2.93 mg/dL — ABNORMAL HIGH (ref 0.61–1.24)
GFR, Estimated: 22 mL/min — ABNORMAL LOW (ref >60)
Glucose, Bld: 90 mg/dL (ref 70–99)
Potassium: 3.8 mmol/L (ref 3.5–5.1)
Sodium: 141 mmol/L (ref 135–145)

## 2021-03-09 LAB — MAGNESIUM: Magnesium: 1.8 mg/dL (ref 1.7–2.4)

## 2021-03-09 LAB — HEPATIC FUNCTION PANEL
ALT: 30 U/L (ref 0–44)
AST: 35 U/L (ref 15–41)
Albumin: 2.7 g/dL — ABNORMAL LOW (ref 3.5–5.0)
Alkaline Phosphatase: 41 U/L (ref 38–126)
Bilirubin, Direct: 0.3 mg/dL — ABNORMAL HIGH (ref 0.0–0.2)
Indirect Bilirubin: 0.6 mg/dL (ref 0.3–0.9)
Total Bilirubin: 0.9 mg/dL (ref 0.3–1.2)
Total Protein: 5.8 g/dL — ABNORMAL LOW (ref 6.5–8.1)

## 2021-03-09 LAB — D-DIMER, QUANTITATIVE: D-Dimer, Quant: 10.91 ug/mL-FEU — ABNORMAL HIGH (ref 0.00–0.50)

## 2021-03-09 MED ORDER — ACETAMINOPHEN 10 MG/ML IV SOLN
1000.0000 mg | Freq: Once | INTRAVENOUS | Status: AC
  Administered 2021-03-09: 1000 mg via INTRAVENOUS
  Filled 2021-03-09: qty 100

## 2021-03-09 MED ORDER — ACETAMINOPHEN 10 MG/ML IV SOLN: 1000.0000 mg | Freq: Four times a day (QID) | INTRAVENOUS | Status: DC

## 2021-03-09 MED ORDER — MAGNESIUM SULFATE 2 GM/50ML IV SOLN
2.0000 g | Freq: Once | INTRAVENOUS | Status: AC
  Administered 2021-03-09: 2 g via INTRAVENOUS
  Filled 2021-03-09: qty 50

## 2021-03-09 MED ORDER — ORAL CARE MOUTH RINSE
15.0000 mL | Freq: Two times a day (BID) | OROMUCOSAL | Status: DC
  Administered 2021-03-09 – 2021-03-11 (×5): 15 mL via OROMUCOSAL

## 2021-03-09 MED ORDER — ALBUMIN HUMAN 5 % IV SOLN: 12.5000 g | Freq: Four times a day (QID) | INTRAVENOUS | Status: DC | PRN

## 2021-03-09 MED ORDER — FUROSEMIDE 10 MG/ML IJ SOLN
40.0000 mg | Freq: Once | INTRAMUSCULAR | Status: AC
  Administered 2021-03-09: 40 mg via INTRAVENOUS
  Filled 2021-03-09: qty 4

## 2021-03-09 MED ORDER — CALCIUM POLYCARBOPHIL 625 MG PO TABS
625.0000 mg | ORAL_TABLET | Freq: Two times a day (BID) | ORAL | Status: DC
  Filled 2021-03-09: qty 1

## 2021-03-09 MED ORDER — MELATONIN 3 MG PO TABS: 3.0000 mg | ORAL_TABLET | Freq: Every day | ORAL | Status: DC

## 2021-03-09 MED ORDER — QUETIAPINE FUMARATE 50 MG PO TABS: 25.0000 mg | ORAL_TABLET | Freq: Every day | ORAL | Status: DC

## 2021-03-09 NOTE — Consult Note (Addendum)
Medical Consultation  Shane [name] QBH:[id number] DOB: 1951/03/13 DOA: 03/05/2021 PCP: Shane Salvage, FNP   Requesting physician: Dr. Johney [name] Date of consultation: 03/09/21 Reason for consultation: AKI, Delirium, Tachypnea  Impression/Recommendations AKI     - received IV lasix today as he UOP was poor last couple of days     - let's check renal US, strict I&O; let's get this info first before continued daily diuresing     - agree w/ bicarb  Delirium     - he's A&O x 3; follows commands, but otherwise with limited interaction     - had hallucinations ON     - can try low dose seroquel at night; but supportive care is going to be the mainstay: mobilization when able, regulate sleep cycles, diet/hydration     - add melatonin as well; acquire sitter as necessary  Tachypnea     - this is most likely secondary to ileus     - he has good air movement and clear lungs     - CXR has some atelectasis and small pleural effusions     - will check d-dimer; but will not be able to follow up with CTA d/t AKI     - spoke with pulm about this patient, they agree most likely ab distention and ileus is the root cause here; if he starts to tire out, he will need to be formally eval'd by them for the possibility of intubation     - UPDATE: D-dimer is elevated. Check venous dopplers; can't get CTA, so will order v/q; he's been on PPx heparin or lovenox his entire stay     - UPDATE: dopplers prelim negative, awaiting V/Q     - He's had a fever. Will order OT IV APAP as he can not take PO or PR right now     - UPDATE: Hgb has been trending down for several days; will not start heparin right now; awaiting v/q scan; PCCM to see   Ileus Rectal CA s/p bowel resection Normocytic anemia Abdominal Pain     - per primary team  [facility] will follow-up again tomorrow. Please contact me if I can be of assistance in the meanwhile. Thank you for this consultation.  Chief Complaint: rectal bleeding.  HPI:   Shane [name] is a 70 y.o. male with medical history significant of rectal CA presenting with rectal bleeding. Admitted to surgical team. Evaluation -- including imaging -- showed a large rectal mass. C-scope showed that this mass was communication with an adjacent loop of sigmoid colon. Further imaging showed communication with right seminal vesicle and pelvic side wall. This prompted surgery to take him for resection of bowel w/ placement of a permanent colostomy. He successfully completed that procedure on 4/27. Since that time he has developed ileus. Over the past couple of days he has become more tachypnic, tachycardic and his creatinine has risen. He has also become more delirious. [facility] was consulted to assist with these issues.     Review of Systems:  Unable to obtain d/t mentation.  Past Medical History:  Diagnosis Date  . Allergy   . Arthritis    knee  . Rectal cancer ([place]) 08/2020   Past Surgical History:  Procedure Laterality Date  . IR IMAGING GUIDED PORT INSERTION  07/25/2020  . OSTOMY N/A 03/05/2021   Procedure: OSTOMY;  Surgeon: Shane Ruff, MD;  Location: [facility];  Service: General;  Laterality: N/A;  . WISDOM TOOTH EXTRACTION    .  XI ROBOTIC ASSISTED LOWER ANTERIOR RESECTION N/A 03/05/2021   Procedure: XI ROBOTIC ASSISTED LOWER ANTERIOR RESECTION WITH MOBILIZATION OF THE SPLENIC FLEXURE OF THE COLON AND END COLOSTOMY;  Surgeon: Shane Ruff, MD;  Location: [facility];  Service: General;  Laterality: N/A;  6 HOURS   Social History:  reports that he quit smoking about 42 years ago. His smoking use included cigarettes. He has a 10.00 pack-year smoking history. He has never used smokeless tobacco. He reports that he does not drink alcohol and does not use drugs.  Allergies  Allergen Reactions  . Nsaids Other (See Comments)    Kidney disease   Family History  Problem Relation Age of Onset  . Healthy Mother   . Hypertension Father   . Stroke Father   . Colon cancer Neg Hx    . Esophageal cancer Neg Hx   . Stomach cancer Neg Hx   . Rectal cancer Neg Hx     Prior to Admission medications   Medication Sig Start Date End Date Taking? Authorizing Provider  Diclofenac Sodium 2 % SOLN Place 2 g onto the skin 2 (two) times daily. Patient taking differently: Place 2 g onto the skin daily as needed (pain). 06/30/19  Yes Shane Pulley, DO  docusate sodium (COLACE) 100 MG capsule Take 100 mg by mouth daily as needed for mild constipation.    Yes [provider]  HYDROcodone-acetaminophen (NORCO/VICODIN) 5-325 MG tablet Take 1 tablet by mouth every 6 (six) hours as needed for moderate pain. 02/12/21  Yes Shane Shark, NP  hydrocortisone (ANUSOL-HC) 2.5 % rectal cream Place 1 application rectally 2 (two) times daily. Patient taking differently: Place 1 application rectally daily as needed for hemorrhoids or anal itching. 01/28/21  Yes Shane Salvage, FNP  lidocaine-prilocaine (EMLA) cream Apply 1 application topically as needed. Patient taking differently: Apply 1 application topically as needed (prior to port use). 10/21/20  Yes Shane Pier, MD  pravastatin (PRAVACHOL) 20 MG tablet TAKE 1 TABLET(20 MG) BY MOUTH DAILY Patient taking differently: Take 20 mg by mouth daily. 11/12/20  Yes Shane Salvage, FNP  capecitabine (XELODA) 500 MG tablet TAKE 2000 MG (4 TABLETS) EVERY MORNING AND 1500 MG (3 TABLETS) EVERY EVENING FOR A TOTAL DAILY DOSE OF 3500 MG ON RADIATION DAYS ONLY. Patient not taking: No sig reported 11/04/20 11/04/21  Shane Pier, MD  diphenoxylate-atropine (LOMOTIL) 2.5-0.025 MG tablet Take 1-2 tablets by mouth 3 (three) times daily as needed for diarrhea or loose stools (Maxiumum of 6/day). Patient not taking: No sig reported 12/25/20   Shane Pier, MD  ondansetron (ZOFRAN) 8 MG tablet Take 1 tablet (8 mg total) by mouth every 8 (eight) hours as needed for nausea or vomiting. Start 72 hours after each IV chemotherapy treatment  07/30/20   Shane Pier, MD  prochlorperazine (COMPAZINE) 10 MG tablet Take 1 tablet (10 mg total) by mouth every 6 (six) hours as needed for nausea. 07/30/20   Shane Pier, MD   Physical Exam: Blood pressure 132/62, pulse (!) 115, temperature 99.1 F (37.3 C), temperature source Oral, resp. rate (!) 40, weight 83.8 kg, SpO2 99 %. Vitals:   03/09/21 0600 03/09/21 0800  BP: (!) 121/55 132/62  Pulse: (!) 107 (!) 115  Resp: (!) 39 (!) 40  Temp:  99.1 F (37.3 C)  SpO2: 99% 99%    General: 70 y.o. ill appearing male resting in bed Eyes: PERRL, normal sclera ENMT: Nares patent w/o discharge,  orophaynx clear, dentition normal, ears w/o discharge/lesions/ulcers Neck: Supple, trachea midline Cardiovascular: tachy, +S1, S2, no m/g/r, equal pulses throughout Respiratory: CTABL, no w/r/r, increased WOB w/ clavicular retractions and accessory muscle usage noted GI: BS hypokenetic, distended and somewhat firm, no tenderness noted, full ostomy left abdominal wall w/ [name] stool, surgical incision areas w/o inflammation MSK: No e/c/c Skin: No rashes, bruises, ulcerations noted Neuro: A&O x 3, no focal deficits Psyc: Limited interaction with a flat affect, he seems quietly anxious but is cooperative with exam  Labs on Admission:  Basic Metabolic Panel: Recent Labs  Lab 03/06/21 0424 03/07/21 0513 03/08/21 0520 03/09/21 0211  NA 139 137 137 141  K 4.5 4.9 4.2 3.8  CL 113* 111 111 112*  CO2 16* 15* 17* 18*  GLUCOSE 144* 66* 99 90  BUN 22 38* 49* 60*  CREATININE 1.91* 2.81* 2.97* 2.93*  CALCIUM 8.2* 7.9* 8.3* 8.7*  MG  --   --  1.8 1.8  PHOS  --   --  3.9  --    Liver Function Tests: Recent Labs  Lab 03/08/21 0520  AST 41  ALT 30  ALKPHOS 62  BILITOT 1.1  PROT 5.4*  ALBUMIN 2.5*   No results for input(s): LIPASE, AMYLASE in the last 168 hours. No results for input(s): AMMONIA in the last 168 hours. CBC: Recent Labs  Lab 03/06/21 0424 03/07/21 0513 03/08/21 0520  03/09/21 0211  WBC 3.0* 7.8 6.8 6.1  HGB 13.1 11.5* 10.4* 8.6*  HCT 38.4* 34.1* 29.9* 23.7*  MCV 90.6 91.7 85.9 82.9  PLT 227 140* 137* 114*   Cardiac Enzymes: No results for input(s): CKTOTAL, CKMB, CKMBINDEX, TROPONINI in the last 168 hours. BNP: Invalid input(s): POCBNP CBG: Recent Labs  Lab 03/08/21 1559 03/08/21 1927 03/08/21 2353 03/09/21 0350 03/09/21 0720  GLUCAP 95 93 92 93 93    Radiological Exams on Admission: DG Abd Portable 1V  Result Date: 03/08/2021 CLINICAL DATA:  Ileus. EXAM: PORTABLE ABDOMEN - 1 VIEW COMPARISON:  None. FINDINGS: There is a paucity of bowel gas limiting evaluation. A surgical drain terminates in the pelvis. Bilateral pleural effusions with underlying atelectasis. No other abnormalities. IMPRESSION: 1. There is a surgical drain terminating in the pelvis. 2. The lack of significant bowel gas limits evaluation for ileus or obstruction. No dilated loops are seen. 3. Small bilateral pleural effusions with underlying atelectasis. Electronically Signed   By: Dorise Bullion III M.D   On: 03/08/2021 14:55    EKG: Independently reviewed. Sinus tach, no st elevations  Time spent: 60 minutes  Hermenegildo Clausen A Glenys Snader DO [facility]  If 7PM-7AM, please contact night-coverage [email or web address] 03/09/2021, 9:44 AM

## 2021-03-09 NOTE — Progress Notes (Addendum)
Pt mentation still intact. Pt getting more drowsy. Tried to get NG tube placed. Pt stated being tired & done. Refused NG tube placement. Pt wants to have conversation with family. Dr. Marylyn Ishihara notified.

## 2021-03-09 NOTE — Progress Notes (Signed)
Pt still labored breathing. Pt using accessory muscles. Pt fatigued. MD aware of pt's condition. Pt still seeing hallucinations. Pt still able to answer orientations questions.

## 2021-03-09 NOTE — Progress Notes (Addendum)
Pt unable to lie flat for Pulmonary Perf & Vent. Dr. Johney Maine notified. Also notified MD about adding IV tylenol to help pt with pain. Dr. Marylyn Ishihara was also notified about IV tylenol. Awaiting response from surgery

## 2021-03-09 NOTE — Progress Notes (Addendum)
Pt refused NG tube placement. Explained importance of NG. Pt refused after education. Dr. Johney Maine notified. Pt comfortable in bed with wife at bedside.

## 2021-03-09 NOTE — Consult Note (Signed)
NAME:  Shane [name], MRN:  [id number], DOB:  1951-02-13, LOS: 4 ADMISSION DATE:  03/05/2021, CONSULTATION DATE:  03/09/2021  REFERRING MD:  Milana Kidney, CHIEF COMPLAINT:  resp distress   History of Present Illness:  70 year old man presented with rectal bleeding, found to have rectal cancer, underwent anterior resection with end colostomy on 4/27.  Postop course complicated by ileus and AKI. NGT was attempted today but could not be placed. Creatinine has plateaued, given Lasix 5/1 Developed confusion overnight, pain medications were switched from Dilaudid to fentanyl with some improvement .  Venous Dopplers were negative for DVT 5/1 He developed increasing respiratory distress and tachypnea, hence PCCM consulted although oxygen requirements have not increased Febrile 100.7  Pertinent  Medical History    Significant Hospital Events: Including procedures, antibiotic start and stop dates in addition to other pertinent events   .   Interim History / Subjective:  Low-grade febrile. Daughter and wife at bedside  Objective   Blood pressure (!) 110/58, pulse (!) 116, temperature (!) 100.7 F (38.2 C), temperature source Axillary, resp. rate (!) 27, weight 83.8 kg, SpO2 98 %.        Intake/Output Summary (Last 24 hours) at 03/09/2021 1722 Last data filed at 03/09/2021 1532 Gross per 24 hour  Intake 2884.58 ml  Output 3525 ml  Net -640.42 ml   Filed Weights   03/05/21 0749 03/07/21 0500 03/09/21 0500  Weight: 83.5 kg 92.2 kg 83.8 kg    Examination: General: Chronically ill-appearing, elderly man, mild distress HENT: No JVD, mild pallor, no icterus Lungs: Decreased breath sounds bilateral, mild accessory muscle use Cardiovascular: S1-S2 tacky, sinus on monitor Abdomen: Distended soft, mild tenderness left lower quadrant, colostomy appears pink Extremities: No deformity, 1+ edema Neuro: Awake, interactive, appears weak and deconditioned, nonfocal   Labs/imaging that I  havepersonally reviewed  (right click and "Reselect all SmartList Selections" daily)  Chest x-ray shows bibasal infiltrates and effusions. X-ray abdomen does not show any evidence of bowel obstruction   Labs show no evidence of leukocytosis , hemoglobin is drifted down from 1.5-8.6, mild thrombocytopenia.  Creatinine has increased from a baseline of 0.9 and plateaued at 2.9  Resolved Hospital Problem list     Assessment & Plan:  Acute respiratory distress -this appears to be related to ileus and bibasilar atelectasis No reason to suspect PE. Likely other causes of elevated D-dimer in the situation including malignancy Incentive spirometry Not a candidate for BiPAP  Ileus -we were able to convince patient of need for NG tube and this was placed by RN. Place NG tube to LIS to decompress  AKI -creatinine has plateaued and he has some urine output so expect this to turn around eventually -Okay to hold off on Lasix for now -Avoid contrast and other nephrotoxins  Delirium -likely ICU delirium, okay to use Haldol as QTC permits. Keep n.p.o. so doubt Seroquel will get absorbed  PCCM will follow   Labs   CBC: Recent Labs  Lab 03/06/21 0424 03/07/21 0513 03/08/21 0520 03/09/21 0211  WBC 3.0* 7.8 6.8 6.1  HGB 13.1 11.5* 10.4* 8.6*  HCT 38.4* 34.1* 29.9* 23.7*  MCV 90.6 91.7 85.9 82.9  PLT 227 140* 137* 114*    Basic Metabolic Panel: Recent Labs  Lab 03/06/21 0424 03/07/21 0513 03/08/21 0520 03/09/21 0211  NA 139 137 137 141  K 4.5 4.9 4.2 3.8  CL 113* 111 111 112*  CO2 16* 15* 17* 18*  GLUCOSE 144* 66* 99 90  BUN  22 38* 49* 60*  CREATININE 1.91* 2.81* 2.97* 2.93*  CALCIUM 8.2* 7.9* 8.3* 8.7*  MG  --   --  1.8 1.8  PHOS  --   --  3.9  --    GFR: Estimated Creatinine Clearance: 23.9 mL/min (A) (by C-G formula based on SCr of 2.93 mg/dL (H)). Recent Labs  Lab 03/06/21 0424 03/07/21 0513 03/08/21 0520 03/09/21 0211  WBC 3.0* 7.8 6.8 6.1    Liver Function  Tests: Recent Labs  Lab 03/08/21 0520 03/09/21 0211  AST 41 35  ALT 30 30  ALKPHOS 62 41  BILITOT 1.1 0.9  PROT 5.4* 5.8*  ALBUMIN 2.5* 2.7*   No results for input(s): LIPASE, AMYLASE in the last 168 hours. No results for input(s): AMMONIA in the last 168 hours.  ABG    Component Value Date/Time   PHART 7.362 03/08/2021 1313   PCO2ART 26.4 (L) 03/08/2021 1313   PO2ART 93.2 03/08/2021 1313   HCO3 14.6 (L) 03/08/2021 1313   ACIDBASEDEF 9.3 (H) 03/08/2021 1313   O2SAT 96.7 03/08/2021 1313     Coagulation Profile: No results for input(s): INR, PROTIME in the last 168 hours.  Cardiac Enzymes: No results for input(s): CKTOTAL, CKMB, CKMBINDEX, TROPONINI in the last 168 hours.  HbA1C: Hgb A1c MFr Bld  Date/Time Value Ref Range Status  08/27/2015 08:57 AM 4.4 (L) 4.6 - 6.5 % Final    Comment:    Glycemic Control Guidelines for People with Diabetes:Non Diabetic:  <6%Goal of Therapy: <7%Additional Action Suggested:  >8%     CBG: Recent Labs  Lab 03/08/21 2353 03/09/21 0350 03/09/21 0720 03/09/21 1142 03/09/21 1531  GLUCAP 92 93 93 86 88    Review of Systems:   Unable to obtain due to distress  Past Medical History:  He,  has a past medical history of Allergy, Arthritis, and Rectal cancer ([place]) (08/2020).   Surgical History:   Past Surgical History:  Procedure Laterality Date  . IR IMAGING GUIDED PORT INSERTION  07/25/2020  . OSTOMY N/A 03/05/2021   Procedure: OSTOMY;  Surgeon: Leighton Ruff, MD;  Location: [facility];  Service: General;  Laterality: N/A;  . WISDOM TOOTH EXTRACTION    . XI ROBOTIC ASSISTED LOWER ANTERIOR RESECTION N/A 03/05/2021   Procedure: XI ROBOTIC ASSISTED LOWER ANTERIOR RESECTION WITH MOBILIZATION OF THE SPLENIC FLEXURE OF THE COLON AND END COLOSTOMY;  Surgeon: Leighton Ruff, MD;  Location: [facility];  Service: General;  Laterality: N/A;  6 HOURS     Social History:   reports that he quit smoking about 42 years ago. His smoking use included  cigarettes. He has a 10.00 pack-year smoking history. He has never used smokeless tobacco. He reports that he does not drink alcohol and does not use drugs.   Family History:  His family history includes Healthy in his mother; Hypertension in his father; Stroke in his father. There is no history of Colon cancer, Esophageal cancer, Stomach cancer, or Rectal cancer.   Allergies Allergies  Allergen Reactions  . Nsaids Other (See Comments)    Kidney disease     Home Medications  Prior to Admission medications   Medication Sig Start Date End Date Taking? Authorizing Provider  Diclofenac Sodium 2 % SOLN Place 2 g onto the skin 2 (two) times daily. Patient taking differently: Place 2 g onto the skin daily as needed (pain). 06/30/19  Yes Lyndal Pulley, DO  docusate sodium (COLACE) 100 MG capsule Take 100 mg by mouth daily as  needed for mild constipation.    Yes [provider]  HYDROcodone-acetaminophen (NORCO/VICODIN) 5-325 MG tablet Take 1 tablet by mouth every 6 (six) hours as needed for moderate pain. 02/12/21  Yes Owens Shark, NP  hydrocortisone (ANUSOL-HC) 2.5 % rectal cream Place 1 application rectally 2 (two) times daily. Patient taking differently: Place 1 application rectally daily as needed for hemorrhoids or anal itching. 01/28/21  Yes Marrian Salvage, FNP  lidocaine-prilocaine (EMLA) cream Apply 1 application topically as needed. Patient taking differently: Apply 1 application topically as needed (prior to port use). 10/21/20  Yes Ladell Pier, MD  pravastatin (PRAVACHOL) 20 MG tablet TAKE 1 TABLET(20 MG) BY MOUTH DAILY Patient taking differently: Take 20 mg by mouth daily. 11/12/20  Yes Marrian Salvage, FNP  capecitabine (XELODA) 500 MG tablet TAKE 2000 MG (4 TABLETS) EVERY MORNING AND 1500 MG (3 TABLETS) EVERY EVENING FOR A TOTAL DAILY DOSE OF 3500 MG ON RADIATION DAYS ONLY. Patient not taking: No sig reported 11/04/20 11/04/21  Ladell Pier, MD   diphenoxylate-atropine (LOMOTIL) 2.5-0.025 MG tablet Take 1-2 tablets by mouth 3 (three) times daily as needed for diarrhea or loose stools (Maxiumum of 6/day). Patient not taking: No sig reported 12/25/20   Ladell Pier, MD  ondansetron (ZOFRAN) 8 MG tablet Take 1 tablet (8 mg total) by mouth every 8 (eight) hours as needed for nausea or vomiting. Start 72 hours after each IV chemotherapy treatment 07/30/20   Ladell Pier, MD  prochlorperazine (COMPAZINE) 10 MG tablet Take 1 tablet (10 mg total) by mouth every 6 (six) hours as needed for nausea. 07/30/20   Ladell Pier, MD     Kara Mead MD. FCCP. [facility] Pager : [phone]  If no response to pager , please call [phone] until 7 pm After 7:00 pm call Elink  [phone]   03/09/2021

## 2021-03-09 NOTE — Progress Notes (Addendum)
Shane [name] [id number] May 22, 1951  CARE TEAM:  PCP: Marrian Salvage, [place]  Outpatient Care Team: Patient Care Team: Marrian Salvage, [place] as PCP - General (Internal Medicine) Benay Spice Izola Price, MD as Consulting Physician (Oncology) Jonnie Finner, RN as Oncology Nurse Navigator Heilingoetter, Tobe Sos, PA-C as Physician Assistant (Physician Assistant) Leighton Ruff, MD as Consulting Physician (General Surgery) Izora Gala, MD as Consulting Physician (Otolaryngology) Lavena Bullion, DO as Consulting Physician (Gastroenterology)  Inpatient Treatment Team: Treatment Team: Attending Provider: Leighton Ruff, MD; Technician: Ernest Mallick, NT; [place] Nurse: Lissa Morales Rudi Heap, RN; Consulting Physician: Michael [place], MD; Registered Nurse: Raenette Rover, RN (Inactive); Technician: Lynett Fish, NT; Utilization Review: Alease Medina, RN; Technician: Resa Miner, [place]; Pharmacist: Efraim Kaufmann, [facility]; Registered Nurse: Vernelle Emerald, RN   Problem List:   Principal Problem:   Rectal carcinoma [facility]) Active Problems:   Degenerative cervical disc   Abnormality of gait   Iron deficiency anemia   Port-A-Cath in place   AKI (acute kidney injury) ([place])   Hemorrhoids, internal   Hypercholesteremia   Pulmonary nodules   Hypoxia   Phimosis of penis s/p dilitation 03/05/2021   Delirium   4 Days Post-Op   03/05/2021  POST-OPERATIVE DIAGNOSIS:  RECTAL CANCER  PROCEDURE:   XI ROBOTIC ASSISTED LOWER ANTERIOR RESECTION WITH END COLOSTOMY MOBILIZATION OF THE SPLENIC FLEXURE    SURGEON: Leighton Ruff, MD  0000000 Postoperative diagnosis: Unable to pass catheter due to tight foreskin  Surgery: Examination under anesthesia; spreading of phimosis with hemostat; insertion of catheter  Surgeon: Dr. Nicki Reaper Macdiarmid     Assessment  GUARDED  [facility] Stay = 4 days)  Plan:  Ferrel Logan for now.  With worse distention despite  bowel fucntion, NGT trial to see if can help decompress.  Simethicone for bloating.    -Hypoxia/tachypnea from abdominal distention.  Try NGT.  Doubt pneumonia.  No severe hypoxia.  Should resolve as ileus resolves.   No escalating oxygenation needs.  Start with medicine consultation.  May need to involve pulmonary if does not turnaround.  -keep in ICU  -He has no peritonitis and drainage serous = not c/w leak/delated perforation.  Follow closely on IV ABx.  CT scan to r/o abscess if not better by POD #5.  He has not had a fever spike nor worsening white count.  Zosyn should cover any intra-abdominal & pulmonary issues at this time.  -With his renal failure no longer oliguric, do test dose of furosemide to see if he can start diuresing.  See what medicine thinks.  -IVF.  Switched to bicarb.  Do have maintenance with albumin bolus backup since he seemed responsive to albumin bolus yesterday.    -AKI - low but not oliguric.  Cr peaking - follow.  Give test dose of furosemide to see if we can get some fluid off him which would help from a gastrointestinal ileus and pulmonary standpoint.  TRH agrees.  I would hold off on any nephrology consult for now since creatinine plateauing, starting to diurese, and medicine coming on board first.  Patient not a good candidate for dialysis at this point anyway  -Confusion from prolonged anesthesia.  Persistent.  Seems just exhausted since he has not been able to sleep well.  No evidence of hypercarbia.  We switched his narcotics around -seems less sedated on fentanyl as opposed to Dilaudid.  Not asking for much narcotics..  Not having any more hypoglycemia issues.  He is able to follow  commands and does not have any focal neurological deficits to my examination, arguing against any TIA/stroke.  I have paged [place] medicine to be involved to see if we are missing anything & help follow.   Dr Marylyn Ishihara to see.  Continue scheduled Tylenol.  PRN Robaxin, low-dose  femtanyl.  -tachycardia - low dose metoprolol with very low threshold to hold if is hypotensive.  Heart rate in the 110s now as opposed to 130s.  An improvement but still concerning.  Cardiology consul if goes into A. fib.  We will see.  -VTE prophylaxis- SCDs, etc.  Switch from enoxaparin to heparin w inc Cr.  Could fully anticoagulate if there is strong suspicion but I doubt any DVT, MI, or PE at this time.  -f/u Pathology from resection.  Suspect at least partial pathologic response and given the tumor shrinkage and massive fibrosis.  -TNA if not opened up by POD#7 - hold off for now w fluid overload & renal issues  -mobilize as tolerated to help recovery.  See if we can get him up to a chair.  Disposition:  Disposition:  The patient is from: Home  Anticipate discharge to:  [place] (SNF)  Anticipated Date of Discharge is:  May 8,2022   Barriers to discharge:  Pending Clinical improvement (more likely than not)  Patient currently is NOT MEDICALLY STABLE for discharge from the hospital from a surgery standpoint.    I updated the patient's status to the patient  Recommendations were made.  Questions were answered.  They expressed understanding & appreciation.    35 minutes spent in review, evaluation, examination, counseling, and coordination of care.   I have reviewed this patient's available data, including medical history, events of note, physical examination and test results as part of my evaluation.  A significant portion of that time was spent in counseling.  Care during the described time interval was provided by me.  03/09/2021    Subjective: (Chief complaint)  Was confused and hallucinating yesterday.  Less so now but exhausted & not very talkative  Denies abdominal pain, just tired.  Still tachypnic.  Blood gas done yesterday.  No hypercarbia.  Switch to bicarb.  Began to have bowel movements out ostomy and urine output has  improved.  Objective:  Vital signs:  Vitals:   03/09/21 0353 03/09/21 0400 03/09/21 0500 03/09/21 0600  BP:  (!) 146/43 (!) 137/47 (!) 121/55  Pulse:  (!) 123 (!) 116 (!) 107  Resp:  (!) 39 (!) 39 (!) 39  Temp: 99.3 F (37.4 C)     TempSrc: Axillary     SpO2:  100% 98% 99%  Weight:   83.8 kg     Last BM Date: 03/08/21  Intake/Output   Yesterday:  04/30 0701 - 05/01 0700 In: 3897.2 [P.O.:480; I.V.:2069.9; IV Piggyback:1347.3] Out: 960 [Urine:885; Drains:50; Stool:25] This shift:  No intake/output data recorded.  Bowel function:  Flatus: No  BM:  No  Drain: Serous   Physical Exam:  General: Pt awake/alert in moderate acute distress.  Tired Eyes: PERRL, normal EOM.  Sclera clear.  No icterus Neuro: CN II-XII intact w/o focal sensory/motor deficits. Lymph: No head/neck/groin lymphadenopathy Psych:  No delerium/psychosis/paranoia.  Oriented x 4 HENT: Normocephalic, Mucus membranes moist.  No thrush Neck: Supple, No tracheal deviation.  No obvious thyromegaly Chest: No pain to chest wall compression.  Fair respiratory excursion.  No audible wheezing CV:  Pulses intact.  Regular rhythm.  No major extremity edema MS: Normal  AROM mjr joints.  No obvious deformity  Abdomen: Somewhat firm.  Very distended.  Mild tenderness to deep palpation L flank only.  No rebound tenderness or guarding.  No pain with cough.  NO PERITONITIS.  Ostomy pink with gas & liquid stool in bag.  Incisions clean,dry, & intact with no purulence or bleeding.  No cellulitis..  No evidence of peritonitis.  No incarcerated hernias.  GU: Foley in place.  Bilateral large scrotal hydroceles stable.  No hernias. Ext:  No deformity.  2+ mjr edema.  No cyanosis Skin: No petechiae / purpurea.  No major sores.  Warm and dry    Results:   Cultures: Recent Results (from the past 720 hour(s))  SARS CORONAVIRUS 2 (TAT 6-24 HRS) Nasopharyngeal Nasopharyngeal Swab     Status: None   Collection Time: 03/03/21  11:13 AM   Specimen: Nasopharyngeal Swab  Result Value Ref Range Status   SARS Coronavirus 2 NEGATIVE NEGATIVE Final    Comment: (NOTE) SARS-CoV-2 target nucleic acids are NOT DETECTED.  The SARS-CoV-2 RNA is generally detectable in upper and lower respiratory specimens during the acute phase of infection. Negative results do not preclude SARS-CoV-2 infection, do not rule out co-infections with other pathogens, and should not be used as the sole basis for treatment or other patient management decisions. Negative results must be combined with clinical observations, patient history, and epidemiological information. The expected result is Negative.  Fact Sheet for Patients: SugarRoll.be  Fact Sheet for Healthcare Providers: https://www.woods-mathews.com/  This test is not yet approved or cleared by the [place] FDA and  has been authorized for detection and/or diagnosis of SARS-CoV-2 by FDA under an Emergency Use Authorization (EUA). This EUA will remain  in effect (meaning this test can be used) for the duration of the COVID-19 declaration under Se ction 564(b)(1) of the Act, 21 U.S.C. section 360bbb-3(b)(1), unless the authorization is terminated or revoked sooner.  Performed at [facility], [place] [address], [place], [place] [address]   MRSA PCR Screening     Status: None   Collection Time: 03/07/21  4:00 AM   Specimen: Nasopharyngeal  Result Value Ref Range Status   MRSA by PCR NEGATIVE NEGATIVE Final    Comment:        The GeneXpert MRSA Assay (FDA approved for NASAL specimens only), is one component of a comprehensive MRSA colonization surveillance program. It is not intended to diagnose MRSA infection nor to guide or monitor treatment for MRSA infections. Performed at [facility], [place] [address], [place], [place] [address]     Labs: Results for orders placed or performed during the hospital  encounter of 03/05/21 (from the past 48 hour(s))  Glucose, capillary     Status: Abnormal   Collection Time: 03/07/21  7:50 AM  Result Value Ref Range   Glucose-Capillary 53 (L) 70 - 99 mg/dL    Comment: Glucose reference range applies only to samples taken after fasting for at least 8 hours.  Glucose, capillary     Status: None   Collection Time: 03/07/21  8:23 AM  Result Value Ref Range   Glucose-Capillary 93 70 - 99 mg/dL    Comment: Glucose reference range applies only to samples taken after fasting for at least 8 hours.  Glucose, capillary     Status: None   Collection Time: 03/07/21 11:25 AM  Result Value Ref Range   Glucose-Capillary 76 70 - 99 mg/dL    Comment: Glucose reference range applies  only to samples taken after fasting for at least 8 hours.  Glucose, capillary     Status: Abnormal   Collection Time: 03/07/21  4:50 PM  Result Value Ref Range   Glucose-Capillary 69 (L) 70 - 99 mg/dL    Comment: Glucose reference range applies only to samples taken after fasting for at least 8 hours.  Glucose, capillary     Status: None   Collection Time: 03/07/21  5:14 PM  Result Value Ref Range   Glucose-Capillary 99 70 - 99 mg/dL    Comment: Glucose reference range applies only to samples taken after fasting for at least 8 hours.  Glucose, capillary     Status: None   Collection Time: 03/07/21  8:04 PM  Result Value Ref Range   Glucose-Capillary 90 70 - 99 mg/dL    Comment: Glucose reference range applies only to samples taken after fasting for at least 8 hours.  Glucose, capillary     Status: None   Collection Time: 03/07/21 11:36 PM  Result Value Ref Range   Glucose-Capillary 86 70 - 99 mg/dL    Comment: Glucose reference range applies only to samples taken after fasting for at least 8 hours.  Glucose, capillary     Status: None   Collection Time: 03/08/21  3:42 AM  Result Value Ref Range   Glucose-Capillary 76 70 - 99 mg/dL    Comment: Glucose reference range applies only  to samples taken after fasting for at least 8 hours.  CBC     Status: Abnormal   Collection Time: 03/08/21  5:20 AM  Result Value Ref Range   WBC 6.8 4.0 - 10.5 K/uL   RBC 3.48 (L) 4.22 - 5.81 MIL/uL   Hemoglobin 10.4 (L) 13.0 - 17.0 g/dL   HCT 29.9 (L) 39.0 - 52.0 %   MCV 85.9 80.0 - 100.0 fL   MCH 29.9 26.0 - 34.0 pg   MCHC 34.8 30.0 - 36.0 g/dL   RDW 14.2 11.5 - 15.5 %   Platelets 137 (L) 150 - 400 K/uL    Comment: REPEATED TO VERIFY   nRBC 0.4 (H) 0.0 - 0.2 %    Comment: Performed at [facility], [place] [address] [place], [place] [address]  Comprehensive metabolic panel     Status: Abnormal   Collection Time: 03/08/21  5:20 AM  Result Value Ref Range   Sodium 137 135 - 145 mmol/L   Potassium 4.2 3.5 - 5.1 mmol/L   Chloride 111 98 - 111 mmol/L   CO2 17 (L) 22 - 32 mmol/L   Glucose, Bld 99 70 - 99 mg/dL    Comment: Glucose reference range applies only to samples taken after fasting for at least 8 hours.   BUN 49 (H) 8 - 23 mg/dL   Creatinine, Ser 2.97 (H) 0.61 - 1.24 mg/dL   Calcium 8.3 (L) 8.9 - 10.3 mg/dL   Total Protein 5.4 (L) 6.5 - 8.1 g/dL   Albumin 2.5 (L) 3.5 - 5.0 g/dL   AST 41 15 - 41 U/L   ALT 30 0 - 44 U/L   Alkaline Phosphatase 62 38 - 126 U/L   Total Bilirubin 1.1 0.3 - 1.2 mg/dL   GFR, Estimated 22 (L) >60 mL/min    Comment: (NOTE) Calculated using the CKD-EPI Creatinine Equation (2021)    Anion gap 9 5 - 15    Comment: Performed at [facility], [place] [address] [place], [place] [address]  Prealbumin  Status: Abnormal   Collection Time: 03/08/21  5:20 AM  Result Value Ref Range   Prealbumin 5.0 (L) 18 - 38 mg/dL    Comment: Performed at [facility], [place] [address] [place], [place] [address]  Magnesium     Status: None   Collection Time: 03/08/21  5:20 AM  Result Value Ref Range   Magnesium 1.8 1.7 - 2.4 mg/dL    Comment: Performed at [facility], [place] [address],  [place], [place] [address]  Phosphorus     Status: None   Collection Time: 03/08/21  5:20 AM  Result Value Ref Range   Phosphorus 3.9 2.5 - 4.6 mg/dL    Comment: Performed at [facility], [place] [address], [place], [place] [address]  Glucose, capillary     Status: None   Collection Time: 03/08/21  7:20 AM  Result Value Ref Range   Glucose-Capillary 75 70 - 99 mg/dL    Comment: Glucose reference range applies only to samples taken after fasting for at least 8 hours.  Glucose, capillary     Status: None   Collection Time: 03/08/21 11:25 AM  Result Value Ref Range   Glucose-Capillary 88 70 - 99 mg/dL    Comment: Glucose reference range applies only to samples taken after fasting for at least 8 hours.  Blood gas, arterial     Status: Abnormal   Collection Time: 03/08/21  1:13 PM  Result Value Ref Range   FIO2 32.00    O2 Content 3.0 L/min   Delivery systems NASAL CANNULA    pH, Arterial 7.362 7.350 - 7.450   pCO2 arterial 26.4 (L) 32.0 - 48.0 mmHg   pO2, Arterial 93.2 83.0 - 108.0 mmHg   Bicarbonate 14.6 (L) 20.0 - 28.0 mmol/L   Acid-base deficit 9.3 (H) 0.0 - 2.0 mmol/L   O2 Saturation 96.7 %   Patient temperature 98.6    Collection site RIGHT RADIAL    Drawn by [id number]    Allens test (pass/fail) PASS PASS    Comment: Performed at [place] [address], [place], [place] [address]  Glucose, capillary     Status: None   Collection Time: 03/08/21  3:59 PM  Result Value Ref Range   Glucose-Capillary 95 70 - 99 mg/dL    Comment: Glucose reference range applies only to samples taken after fasting for at least 8 hours.  Glucose, capillary     Status: None   Collection Time: 03/08/21  7:27 PM  Result Value Ref Range   Glucose-Capillary 93 70 - 99 mg/dL    Comment: Glucose reference range applies only to samples taken after fasting for at least 8 hours.  Glucose, capillary     Status: None   Collection Time: 03/08/21 11:53 PM  Result Value Ref  Range   Glucose-Capillary 92 70 - 99 mg/dL    Comment: Glucose reference range applies only to samples taken after fasting for at least 8 hours.  CBC     Status: Abnormal   Collection Time: 03/09/21  2:11 AM  Result Value Ref Range   WBC 6.1 4.0 - 10.5 K/uL   RBC 2.86 (L) 4.22 - 5.81 MIL/uL   Hemoglobin 8.6 (L) 13.0 - 17.0 g/dL   HCT 23.7 (L) 39.0 - 52.0 %   MCV 82.9 80.0 - 100.0 fL   MCH 30.1 26.0 - 34.0 pg   MCHC 36.3 (H) 30.0 - 36.0 g/dL   RDW 13.9 11.5 -  15.5 %   Platelets 114 (L) 150 - 400 K/uL    Comment: Immature Platelet Fraction may be clinically indicated, consider ordering this additional test [id number] PLATELET COUNT CONFIRMED BY SMEAR    nRBC 0.8 (H) 0.0 - 0.2 %    Comment: Performed at [facility], [place] [address], [place], [place] [id number]  Basic metabolic panel     Status: Abnormal   Collection Time: 03/09/21  2:11 AM  Result Value Ref Range   Sodium 141 135 - 145 mmol/L   Potassium 3.8 3.5 - 5.1 mmol/L   Chloride 112 (H) 98 - 111 mmol/L   CO2 18 (L) 22 - 32 mmol/L   Glucose, Bld 90 70 - 99 mg/dL    Comment: Glucose reference range applies only to samples taken after fasting for at least 8 hours.   BUN 60 (H) 8 - 23 mg/dL   Creatinine, Ser 2.93 (H) 0.61 - 1.24 mg/dL   Calcium 8.7 (L) 8.9 - 10.3 mg/dL   GFR, Estimated 22 (L) >60 mL/min    Comment: (NOTE) Calculated using the CKD-EPI Creatinine Equation (2021)    Anion gap 11 5 - 15    Comment: Performed at [facility], [place] [address] [place], [place] [address]  Magnesium     Status: None   Collection Time: 03/09/21  2:11 AM  Result Value Ref Range   Magnesium 1.8 1.7 - 2.4 mg/dL    Comment: Performed at [facility], [place] [address] [place], [place] [address]  Glucose, capillary     Status: None   Collection Time: 03/09/21  3:50 AM  Result Value Ref Range   Glucose-Capillary 93 70 - 99 mg/dL    Comment: Glucose reference range applies only to  samples taken after fasting for at least 8 hours.  Glucose, capillary     Status: None   Collection Time: 03/09/21  7:20 AM  Result Value Ref Range   Glucose-Capillary 93 70 - 99 mg/dL    Comment: Glucose reference range applies only to samples taken after fasting for at least 8 hours.    Imaging / Studies: DG Abd Portable 1V  Result Date: 03/08/2021 CLINICAL DATA:  Ileus. EXAM: PORTABLE ABDOMEN - 1 VIEW COMPARISON:  None. FINDINGS: There is a paucity of bowel gas limiting evaluation. A surgical drain terminates in the pelvis. Bilateral pleural effusions with underlying atelectasis. No other abnormalities. IMPRESSION: 1. There is a surgical drain terminating in the pelvis. 2. The lack of significant bowel gas limits evaluation for ileus or obstruction. No dilated loops are seen. 3. Small bilateral pleural effusions with underlying atelectasis. Electronically Signed   By: Dorise Bullion III M.D   On: 03/08/2021 14:55    Medications / Allergies: per chart  Antibiotics: Anti-infectives (From admission, onward)   Start     Dose/Rate Route Frequency Ordered Stop   03/08/21 0030  piperacillin-tazobactam (ZOSYN) IVPB 3.375 g        3.375 g 12.5 mL/hr over 240 Minutes Intravenous Every 8 hours 03/08/21 0016 03/13/21 0029   03/05/21 2200  cefoTEtan (CEFOTAN) 2 g in sodium chloride 0.9 % 100 mL IVPB        2 g 200 mL/hr over 30 Minutes Intravenous Every 12 hours 03/05/21 1841 03/05/21 2258   03/05/21 0730  cefoTEtan (CEFOTAN) 2 g in sodium chloride 0.9 % 100 mL IVPB        2 g 200 mL/hr over 30 Minutes Intravenous On call  to O.R. 03/05/21 [id number] 03/05/21 0926        Note: Portions of this report may have been transcribed using voice recognition software. Every effort was made to ensure accuracy; however, inadvertent computerized transcription errors may be present.   Any transcriptional errors that result from this process are unintentional.    Adin Hector, MD, FACS,  MASCRS  Esophageal, Gastrointestinal & Colorectal Surgery Robotic and Minimally Invasive Surgery [facility] [address] [place], [place] [address] [phone] Fax [phone] Main/Paging  CONTACT INFORMATION: Weekday (9AM-5PM) concerns: Call [facility] main office at [phone] Weeknight (5PM-9AM) or Weekend/Holiday concerns: Check [email or web address] for General Surgery [facility] coverage (Please, do not use SecureChat as it is not reliable communication to operating surgeons for immediate patient care)      03/09/2021  7:32 AM

## 2021-03-09 NOTE — Progress Notes (Signed)
Lower extremity venous has been completed.   Preliminary results in CV Proc.   Abram Sander 03/09/2021 1:59 PM

## 2021-03-09 NOTE — Progress Notes (Signed)
Pt having wheezing in all bases of lungs. Pt given breathing treatment. Wheezing has decreased. Charge nurse notified of condition of pt. Dr. Marylyn Ishihara also notified of pt condition.

## 2021-03-10 ENCOUNTER — Inpatient Hospital Stay: Payer: Self-pay

## 2021-03-10 DIAGNOSIS — R0682 Tachypnea, not elsewhere classified: Secondary | ICD-10-CM

## 2021-03-10 DIAGNOSIS — C20 Malignant neoplasm of rectum: Secondary | ICD-10-CM | POA: Diagnosis not present

## 2021-03-10 DIAGNOSIS — K567 Ileus, unspecified: Secondary | ICD-10-CM | POA: Diagnosis not present

## 2021-03-10 DIAGNOSIS — R410 Disorientation, unspecified: Secondary | ICD-10-CM | POA: Diagnosis not present

## 2021-03-10 DIAGNOSIS — R0603 Acute respiratory distress: Secondary | ICD-10-CM | POA: Diagnosis not present

## 2021-03-10 DIAGNOSIS — N179 Acute kidney failure, unspecified: Secondary | ICD-10-CM | POA: Diagnosis not present

## 2021-03-10 LAB — BASIC METABOLIC PANEL
Anion gap: 11 (ref 5–15)
BUN: 61 mg/dL — ABNORMAL HIGH (ref 8–23)
CO2: 23 mmol/L (ref 22–32)
Calcium: 8.6 mg/dL — ABNORMAL LOW (ref 8.9–10.3)
Chloride: 110 mmol/L (ref 98–111)
Creatinine, Ser: 2.1 mg/dL — ABNORMAL HIGH (ref 0.61–1.24)
GFR, Estimated: 33 mL/min — ABNORMAL LOW (ref >60)
Glucose, Bld: 105 mg/dL — ABNORMAL HIGH (ref 70–99)
Potassium: 3.2 mmol/L — ABNORMAL LOW (ref 3.5–5.1)
Sodium: 144 mmol/L (ref 135–145)

## 2021-03-10 LAB — RETICULOCYTES
Immature Retic Fract: 12.1 % (ref 2.3–15.9)
RBC.: 2.68 MIL/uL — ABNORMAL LOW (ref 4.22–5.81)
Retic Count, Absolute: 26.5 10*3/uL (ref 19.0–186.0)
Retic Ct Pct: 1 % (ref 0.4–3.1)

## 2021-03-10 LAB — FOLATE: Folate: 7.9 ng/mL (ref >5.9)

## 2021-03-10 LAB — CBC
HCT: 21.5 % — ABNORMAL LOW (ref 39.0–52.0)
Hemoglobin: 7.9 g/dL — ABNORMAL LOW (ref 13.0–17.0)
MCH: 29.9 pg (ref 26.0–34.0)
MCHC: 36.7 g/dL — ABNORMAL HIGH (ref 30.0–36.0)
MCV: 81.4 fL (ref 80.0–100.0)
Platelets: 123 10*3/uL — ABNORMAL LOW (ref 150–400)
RBC: 2.64 MIL/uL — ABNORMAL LOW (ref 4.22–5.81)
RDW: 14.3 % (ref 11.5–15.5)
WBC: 7.2 10*3/uL (ref 4.0–10.5)
nRBC: 1.4 % — ABNORMAL HIGH (ref 0.0–0.2)

## 2021-03-10 LAB — GLUCOSE, CAPILLARY
Glucose-Capillary: 102 mg/dL — ABNORMAL HIGH (ref 70–99)
Glucose-Capillary: 104 mg/dL — ABNORMAL HIGH (ref 70–99)
Glucose-Capillary: 108 mg/dL — ABNORMAL HIGH (ref 70–99)
Glucose-Capillary: 118 mg/dL — ABNORMAL HIGH (ref 70–99)
Glucose-Capillary: 130 mg/dL — ABNORMAL HIGH (ref 70–99)
Glucose-Capillary: 94 mg/dL (ref 70–99)
Glucose-Capillary: 97 mg/dL (ref 70–99)

## 2021-03-10 LAB — MAGNESIUM: Magnesium: 2.2 mg/dL (ref 1.7–2.4)

## 2021-03-10 LAB — IRON AND TIBC
Iron: 20 ug/dL — ABNORMAL LOW (ref 45–182)
Saturation Ratios: 10 % — ABNORMAL LOW (ref 17.9–39.5)
TIBC: 205 ug/dL — ABNORMAL LOW (ref 250–450)
UIBC: 185 ug/dL

## 2021-03-10 LAB — VITAMIN B12: Vitamin B-12: 888 pg/mL (ref 180–914)

## 2021-03-10 LAB — FERRITIN: Ferritin: 214 ng/mL (ref 24–336)

## 2021-03-10 MED ORDER — SODIUM CHLORIDE 0.9 % IV SOLN
INTRAVENOUS | Status: DC | PRN
  Administered 2021-03-10 (×2): 500 mL via INTRAVENOUS
  Administered 2021-03-11: 1000 mL via INTRAVENOUS

## 2021-03-10 MED ORDER — MELATONIN 3 MG PO TABS
3.0000 mg | ORAL_TABLET | Freq: Every day | ORAL | Status: DC
  Administered 2021-03-14 – 2021-04-10 (×27): 3 mg via ORAL
  Filled 2021-03-10 (×27): qty 1

## 2021-03-10 MED ORDER — SODIUM CHLORIDE 0.9 % IV SOLN: 500.0000 mg | Freq: Once | INTRAVENOUS | Status: DC

## 2021-03-10 MED ORDER — SODIUM CHLORIDE 0.9 % IV SOLN: 25.0000 mg | Freq: Once | INTRAVENOUS | Status: DC

## 2021-03-10 MED ORDER — SODIUM CHLORIDE 0.9% FLUSH
10.0000 mL | Freq: Two times a day (BID) | INTRAVENOUS | Status: DC
  Administered 2021-03-11: 10 mL
  Administered 2021-03-11: 40 mL
  Administered 2021-03-12 – 2021-03-31 (×19): 10 mL
  Administered 2021-03-31 – 2021-04-01 (×2): 30 mL
  Administered 2021-04-01: 10 mL
  Administered 2021-04-02: 30 mL
  Administered 2021-04-02 – 2021-04-07 (×8): 10 mL

## 2021-03-10 MED ORDER — INSULIN ASPART 100 UNIT/ML IJ SOLN
0.0000 [IU] | INTRAMUSCULAR | Status: DC
  Administered 2021-03-11 – 2021-03-14 (×16): 1 [IU] via SUBCUTANEOUS
  Administered 2021-03-14: 2 [IU] via SUBCUTANEOUS
  Administered 2021-03-14 – 2021-03-15 (×8): 1 [IU] via SUBCUTANEOUS

## 2021-03-10 MED ORDER — SODIUM CHLORIDE 0.9 % IV SOLN
25.0000 mg | Freq: Once | INTRAVENOUS | Status: AC
  Administered 2021-03-10: 25 mg via INTRAVENOUS
  Filled 2021-03-10: qty 0.5

## 2021-03-10 MED ORDER — SODIUM CHLORIDE 0.9 % IV BOLUS
250.0000 mL | Freq: Once | INTRAVENOUS | Status: AC
  Administered 2021-03-10: 250 mL via INTRAVENOUS

## 2021-03-10 MED ORDER — SODIUM CHLORIDE 0.9 % IV SOLN
25.0000 mg | Freq: Once | INTRAVENOUS | Status: DC
  Filled 2021-03-10: qty 0.5

## 2021-03-10 MED ORDER — SODIUM CHLORIDE 0.9 % IV SOLN
500.0000 mg | Freq: Once | INTRAVENOUS | Status: AC
  Administered 2021-03-10: 500 mg via INTRAVENOUS
  Filled 2021-03-10: qty 10

## 2021-03-10 MED ORDER — TRAVASOL 10 % IV SOLN
INTRAVENOUS | Status: AC
  Filled 2021-03-10: qty 499.2

## 2021-03-10 MED ORDER — SODIUM CHLORIDE 0.9% FLUSH
10.0000 mL | INTRAVENOUS | Status: DC | PRN
  Administered 2021-03-21: 10 mL
  Administered 2021-03-28: 20 mL
  Administered 2021-03-28 – 2021-04-10 (×7): 10 mL

## 2021-03-10 MED ORDER — SODIUM CHLORIDE 3 % IN NEBU
4.0000 mL | INHALATION_SOLUTION | Freq: Two times a day (BID) | RESPIRATORY_TRACT | Status: AC
  Administered 2021-03-10 – 2021-03-13 (×6): 4 mL via RESPIRATORY_TRACT
  Filled 2021-03-10 (×6): qty 4

## 2021-03-10 MED ORDER — POTASSIUM CHLORIDE 10 MEQ/100ML IV SOLN
10.0000 meq | INTRAVENOUS | Status: AC
  Administered 2021-03-10 (×4): 10 meq via INTRAVENOUS
  Filled 2021-03-10 (×4): qty 100

## 2021-03-10 MED ORDER — ALBUMIN HUMAN 5 % IV SOLN
25.0000 g | Freq: Once | INTRAVENOUS | Status: AC
  Administered 2021-03-10: 25 g via INTRAVENOUS
  Filled 2021-03-10: qty 500

## 2021-03-10 MED ORDER — ACETAMINOPHEN 10 MG/ML IV SOLN
1000.0000 mg | Freq: Four times a day (QID) | INTRAVENOUS | Status: AC | PRN
  Administered 2021-03-10 (×2): 1000 mg via INTRAVENOUS
  Filled 2021-03-10 (×2): qty 100

## 2021-03-10 NOTE — Progress Notes (Signed)
RN notified to remove all PIV's and change IV tubing .  PICC ready to use.

## 2021-03-10 NOTE — Progress Notes (Signed)
Peripherally Inserted Central Catheter Placement  The IV Nurse has discussed with the patient and/or persons authorized to consent for the patient, the purpose of this procedure and the potential benefits and risks involved with this procedure.  The benefits include less needle sticks, lab draws from the catheter, and the patient may be discharged home with the catheter. Risks include, but not limited to, infection, bleeding, blood clot (thrombus formation), and puncture of an artery; nerve damage and irregular heartbeat and possibility to perform a PICC exchange if needed/ordered by physician.  Alternatives to this procedure were also discussed.  Bard Power PICC patient education guide, fact sheet on infection prevention and patient information card has been provided to patient /or left at bedside.    PICC Placement Documentation  PICC Triple Lumen 05/39/76 PICC Right Basilic 39 cm 1 cm (Active)  Indication for Insertion or Continuance of Line Administration of hyperosmolar/irritating solutions (i.e. TPN, Vancomycin, etc.) 03/10/21 1525  Exposed Catheter (cm) 1 cm 03/10/21 1525  Site Assessment Clean;Dry;Intact 03/10/21 1525  Lumen #1 Status Saline locked;Flushed;Blood return noted 03/10/21 1525  Lumen #2 Status Flushed;Saline locked;Blood return noted 03/10/21 1525  Lumen #3 Status Flushed;Saline locked;Blood return noted 03/10/21 1525  Dressing Type Transparent 03/10/21 1525  Dressing Status Clean;Dry;Intact 03/10/21 1525  Antimicrobial disc in place? Yes 03/10/21 [place] Not Applicable 73/41/93 7902  Line Care Connections checked and tightened 03/10/21 1525  Line Adjustment (NICU/IV Team Only) No 03/10/21 1525  Dressing Intervention New dressing 03/10/21 1525  Dressing Change Due 03/17/21 03/10/21 1525       Rolena Infante 03/10/2021, 3:26 PM

## 2021-03-10 NOTE — Progress Notes (Signed)
   NAME:  Shane [name], MRN:  [id number], DOB:  11/05/1951, LOS: 5 ADMISSION DATE:  03/05/2021, CONSULTATION DATE:  03/10/2021  REFERRING MD:  Milana Kidney, CHIEF COMPLAINT:  resp distress   History of Present Illness:  70 year old man presented with rectal bleeding, found to have rectal cancer, underwent anterior resection with end colostomy on 4/27.  Postop course complicated by ileus and AKI. NGT was attempted today but could not be placed. Creatinine has plateaued, given Lasix 5/1 Developed confusion overnight, pain medications were switched from Dilaudid to fentanyl with some improvement .  Venous Dopplers were negative for DVT 5/1 He developed increasing respiratory distress and tachypnea, hence PCCM consulted although oxygen requirements have not increased Febrile 100.7  Pertinent  Medical History    Significant Hospital Events: Including procedures, antibiotic start and stop dates in addition to other pertinent events   .   Interim History / Subjective:  NAEON. Comfortable this AM.  On room air.  Objective   Blood pressure (!) 121/57, pulse 70, temperature 99.7 F (37.6 C), temperature source Axillary, resp. rate (!) 25, weight 76.2 kg, SpO2 92 %.        Intake/Output Summary (Last 24 hours) at 03/10/2021 0738 Last data filed at 03/10/2021 0700 Gross per 24 hour  Intake 2158.77 ml  Output 8275 ml  Net -6116.23 ml   Filed Weights   03/07/21 0500 03/09/21 0500 03/10/21 0500  Weight: 92.2 kg 83.8 kg 76.2 kg    Examination: General: Chronically ill-appearing, elderly man, resting comfortably. HENT: /AT.  No JVD, no icterus. NGT in place. Lungs: Normal effort.  Decreased breath sounds bilateral bases, otherwise clear. Cardiovascular: RRR, no M/R/G. Abdomen: Distended soft, colostomy appears pink Extremities: No deformity, 1+ edema Neuro:  Nods head in response to questions.  MAE's.   Labs/imaging that I havepersonally reviewed  (right click and "Reselect all SmartList  Selections" daily)  Chest x-ray shows bibasal infiltrates and effusions. X-ray abdomen does not show any evidence of bowel obstruction   Resolved Hospital Problem list     Assessment & Plan:  Acute respiratory distress - this appeared to be related to ileus and bibasilar atelectasis.  No reason to suspect PE - Likely other causes of elevated D-dimer in the situation including malignancy.  Improved overnight and resolved as of AM 5/2, currently comfortable on room air. - Continue IS and push bronchial hygiene. - Keep NGT to LIS. - Mobilize as able. - Not a candidate for BiPAP currently.  Ileus. - Continue NGT to LIS for decompression.  AKI - creatinine has plateaued and he has decent urine output so expect this to turn around eventually. - Continue supportive care. - Avoid contrast and other nephrotoxins.  Hypokalemia. - 4 runs K already ordered. - Follow BMP.  Delirium - likely ICU delirium. - OK to use Haldol as QTC permits.  Respiratory status much improved.  Good output from NGT.  Expect ileus to resolve gradually.   Nothing further to add from our standpoint.  PCCM will sign off.  Please do not hesitate to call us back if we can be of any further assistance.    Montey Hora, [facility] For pager details, please see AMION or use Epic chat  After 1900, please call [facility] for cross coverage needs 03/10/2021, 7:55 AM

## 2021-03-10 NOTE — Progress Notes (Signed)
PICC consent obtained via telephone from wife.

## 2021-03-10 NOTE — Progress Notes (Signed)
PT Cancellation Note  Patient Details Name: Shane [name] MRN: [id number] DOB: August 04, 1951   Cancelled Treatment:    Reason Eval/Treat Not Completed: Medical issues which prohibited therapy, will follow for medically ready to partiicpate   Claretha Cooper 03/10/2021, 2:23 PM Tresa Endo [place] Pager [phone] Office [phone]

## 2021-03-10 NOTE — Progress Notes (Signed)
Hospitalist Consult Progress Note   Shane [name]  [id number]  DOB: 10/29/1951  DOA: 03/05/2021  PCP: Marrian Salvage, FNP  Requesting provider: Dr. Johney Maine Reason for consultation: AKI, delirium, tachypnea   History of Present Illness: Shane [name] is a 70 y.o. male with medical history significant of rectal CA (possibly stage IV given pulm nodules) presented with rectal bleeding. Admitted to surgical team. Evaluation -- including imaging -- showed a large rectal mass. C-scope showed that this mass was in communication with an adjacent loop of sigmoid colon. Further imaging showed communication with right seminal vesicle and pelvic side wall. This prompted surgery to take him for resection of bowel w/ placement of a permanent colostomy. He successfully completed that procedure on 4/27.  Since that time he has developed ileus.  Over the past couple of days he has become more tachypnic, tachycardic and his creatinine had risen. He has also become more delirious. TRH was consulted to assist with these issues.   Interval history: Patient seen on 5/2. Chart reviewed and patient examined. He's oriented to name, president and mostly his situation. Too tired/confused to answer the year but was trying.  Hospital day 5 without nutrition; given underlying cancer background, I agree with starting TPN now and not waiting further.  Larger picture, this gentleman has had chemo and radiation now with further complications of his cancer. His physical status and reserve is poor and quality of life has significantly decreased. I am going to involve palliative care at this point as I'm not sure how much we're doing for him rather than to him at this time.   Review of Systems: Review of Systems - too weak to obtain full ROS   Assessment/Plan:  Rectal cancer - s/p radiation/Xeloda 01/03/21 - has persistent lung nodules seen on CT scans; concern would be mets? 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 - despite aggressive treatment patient seems to be having difficulty recovering with such poor physical reserve; at least appropriate for [place] discussions given full code status and now requiring initiation of TPN for prolonged ileus and decline  AKI Non anion gap metabolic acidosis - appears intravascularly volume depleted; avoid IVF for now given hypoalb and likely will 3rd space it out - TPN should help with increasing protein stores as well as alb infusion he's been getting - other considered differential is ATN; he diuresed about ~4L after lasix on 5/1, but avoid further for now - renal u/s reviewed and okay too - acidosis has improved; would discontinue bicarb drip by tomorrow if CO2 remains appropriate   Delirium - not unexpected given hospitalization and underlying frail state - QTc 411 on 03/06/21 EKG - mood okay at this time; use haldol for agitation if needed - re-orient as needed otherwise supportive care  Tachypnea Fever Probable aspiration - likely due to compression atelectasis and effusions noted on CXR from 5/1; he's been laying in bed for ~5 days - encourage IS use - if able to get patient to a chair that may help as well - s/p lasix on 5/1, but improving nutrition status will help auto-diurese as protein improves - fever also likely atelectasis and/or aspiration given large NGT output, ileus, and probably poor cough/gag ability; would continue zosyn and complete an aspiration course   Past Medical History: Past Medical History:  Diagnosis Date  . Allergy   . Arthritis  knee  . Rectal cancer ([place]) 08/2020    Past Surgical History: Past Surgical History:  Procedure Laterality Date  . IR IMAGING GUIDED PORT INSERTION  07/25/2020  . OSTOMY N/A 03/05/2021   Procedure: OSTOMY;  Surgeon: Leighton Ruff, MD;  Location: [facility];  Service: General;  Laterality: N/A;  . WISDOM TOOTH EXTRACTION    . XI ROBOTIC ASSISTED LOWER  ANTERIOR RESECTION N/A 03/05/2021   Procedure: XI ROBOTIC ASSISTED LOWER ANTERIOR RESECTION WITH MOBILIZATION OF THE SPLENIC FLEXURE OF THE COLON AND END COLOSTOMY;  Surgeon: Leighton Ruff, MD;  Location: [facility];  Service: General;  Laterality: N/A;  6 HOURS    Allergies:   Allergies  Allergen Reactions  . Nsaids Other (See Comments)    Kidney disease    Social History:  reports that he quit smoking about 42 years ago. His smoking use included cigarettes. He has a 10.00 pack-year smoking history. He has never used smokeless tobacco. He reports that he does not drink alcohol and does not use drugs.  Family History: Family History  Problem Relation Age of Onset  . Healthy Mother   . Hypertension Father   . Stroke Father   . Colon cancer Neg Hx   . Esophageal cancer Neg Hx   . Stomach cancer Neg Hx   . Rectal cancer Neg Hx    Objective: Physical Exam: Vitals:   03/10/21 0500 03/10/21 0600 03/10/21 0800 03/10/21 0900  BP: (!) 134/51 (!) 121/57 99/61 (!) 126/46  Pulse: 70   (!) 114  Resp: (!) 32 (!) 25 (!) 21 (!) 26  Temp:   99.4 F (37.4 C)   TempSrc:   Axillary   SpO2: 95% 92% 96% 96%  Weight: 76.2 kg      General appearance: weak, frail, deconditioned; almost slurring from weakness. Says his name, president, and says he had "surgery" Head: Normocephalic, without obvious abnormality, atraumatic, NGT in place Eyes: EOMI Lungs: mild coarse BS, no wheezing Heart: regular rate and rhythm and S1, S2 normal Abdomen: ostomy bag in place with brown stool. Absent bowel sounds. Nonsp TTP. RUQ drain noted Extremities: no edema Skin: mobility and turgor normal Neurologic: moves all 4 extremities to command  Data reviewed:  I have personally reviewed following labs and imaging studies Results for orders placed or performed during the hospital encounter of 03/05/21 (from the past 48 hour(s))  Glucose, capillary     Status: None   Collection Time: 03/08/21 11:25 AM  Result Value  Ref Range   Glucose-Capillary 88 70 - 99 mg/dL    Comment: Glucose reference range applies only to samples taken after fasting for at least 8 hours.  Blood gas, arterial     Status: Abnormal   Collection Time: 03/08/21  1:13 PM  Result Value Ref Range   FIO2 32.00    O2 Content 3.0 L/min   Delivery systems NASAL CANNULA    pH, Arterial 7.362 7.350 - 7.450   pCO2 arterial 26.4 (L) 32.0 - 48.0 mmHg   pO2, Arterial 93.2 83.0 - 108.0 mmHg   Bicarbonate 14.6 (L) 20.0 - 28.0 mmol/L   Acid-base deficit 9.3 (H) 0.0 - 2.0 mmol/L   O2 Saturation 96.7 %   Patient temperature 98.6    Collection site RIGHT RADIAL    Drawn by [id number]    Allens test (pass/fail) PASS PASS    Comment: Performed at [facility] [address] [place], [place] [address]  Glucose, capillary  Status: None   Collection Time: 03/08/21  3:59 PM  Result Value Ref Range   Glucose-Capillary 95 70 - 99 mg/dL    Comment: Glucose reference range applies only to samples taken after fasting for at least 8 hours.  Glucose, capillary     Status: None   Collection Time: 03/08/21  7:27 PM  Result Value Ref Range   Glucose-Capillary 93 70 - 99 mg/dL    Comment: Glucose reference range applies only to samples taken after fasting for at least 8 hours.  Glucose, capillary     Status: None   Collection Time: 03/08/21 11:53 PM  Result Value Ref Range   Glucose-Capillary 92 70 - 99 mg/dL    Comment: Glucose reference range applies only to samples taken after fasting for at least 8 hours.  CBC     Status: Abnormal   Collection Time: 03/09/21  2:11 AM  Result Value Ref Range   WBC 6.1 4.0 - 10.5 K/uL   RBC 2.86 (L) 4.22 - 5.81 MIL/uL   Hemoglobin 8.6 (L) 13.0 - 17.0 g/dL   HCT 23.7 (L) 39.0 - 52.0 %   MCV 82.9 80.0 - 100.0 fL   MCH 30.1 26.0 - 34.0 pg   MCHC 36.3 (H) 30.0 - 36.0 g/dL   RDW 13.9 11.5 - 15.5 %   Platelets 114 (L) 150 - 400 K/uL    Comment: Immature Platelet Fraction may be clinically  indicated, consider ordering this additional test [id number] PLATELET COUNT CONFIRMED BY SMEAR    nRBC 0.8 (H) 0.0 - 0.2 %    Comment: Performed at [facility], [place] [address] [place], [place] [id number]  Basic metabolic panel     Status: Abnormal   Collection Time: 03/09/21  2:11 AM  Result Value Ref Range   Sodium 141 135 - 145 mmol/L   Potassium 3.8 3.5 - 5.1 mmol/L   Chloride 112 (H) 98 - 111 mmol/L   CO2 18 (L) 22 - 32 mmol/L   Glucose, Bld 90 70 - 99 mg/dL    Comment: Glucose reference range applies only to samples taken after fasting for at least 8 hours.   BUN 60 (H) 8 - 23 mg/dL   Creatinine, Ser 2.93 (H) 0.61 - 1.24 mg/dL   Calcium 8.7 (L) 8.9 - 10.3 mg/dL   GFR, Estimated 22 (L) >60 mL/min    Comment: (NOTE) Calculated using the CKD-EPI Creatinine Equation (2021)    Anion gap 11 5 - 15    Comment: Performed at [facility], [place] [address] [place], [place] [address]  Magnesium     Status: None   Collection Time: 03/09/21  2:11 AM  Result Value Ref Range   Magnesium 1.8 1.7 - 2.4 mg/dL    Comment: Performed at [facility], [place] [address], [place], [place] [address]  Hepatic function panel     Status: Abnormal   Collection Time: 03/09/21  2:11 AM  Result Value Ref Range   Total Protein 5.8 (L) 6.5 - 8.1 g/dL   Albumin 2.7 (L) 3.5 - 5.0 g/dL   AST 35 15 - 41 U/L   ALT 30 0 - 44 U/L   Alkaline Phosphatase 41 38 - 126 U/L   Total Bilirubin 0.9 0.3 - 1.2 mg/dL   Bilirubin, Direct 0.3 (H) 0.0 - 0.2 mg/dL   Indirect Bilirubin 0.6 0.3 - 0.9 mg/dL    Comment: Performed at [facility], [place] [address] [place], [place] [address]  Glucose, capillary     Status: None   Collection Time: 03/09/21  3:50 AM  Result Value Ref Range   Glucose-Capillary 93 70 - 99 mg/dL    Comment: Glucose reference range applies only to samples taken after fasting for at least 8 hours.  Glucose, capillary     Status:  None   Collection Time: 03/09/21  7:20 AM  Result Value Ref Range   Glucose-Capillary 93 70 - 99 mg/dL    Comment: Glucose reference range applies only to samples taken after fasting for at least 8 hours.  D-dimer, quantitative     Status: Abnormal   Collection Time: 03/09/21 10:53 AM  Result Value Ref Range   D-Dimer, Quant 10.91 (H) 0.00 - 0.50 ug/mL-FEU    Comment: (NOTE) At the manufacturer cut-off value of 0.5 g/mL FEU, this assay has a negative predictive value of 95-100%.This assay is intended for use in conjunction with a clinical pretest probability (PTP) assessment model to exclude pulmonary embolism (PE) and deep venous thrombosis (DVT) in outpatients suspected of PE or DVT. Results should be correlated with clinical presentation. Performed at [facility], [place] [address], [place], [place] [address]   Glucose, capillary     Status: None   Collection Time: 03/09/21 11:42 AM  Result Value Ref Range   Glucose-Capillary 86 70 - 99 mg/dL    Comment: Glucose reference range applies only to samples taken after fasting for at least 8 hours.  Glucose, capillary     Status: None   Collection Time: 03/09/21  3:31 PM  Result Value Ref Range   Glucose-Capillary 88 70 - 99 mg/dL    Comment: Glucose reference range applies only to samples taken after fasting for at least 8 hours.  Culture, blood (routine x 2)     Status: None (Preliminary result)   Collection Time: 03/09/21  6:24 PM   Specimen: BLOOD  Result Value Ref Range   Specimen Description      BLOOD BLOOD RIGHT HAND Performed at [facility] [address], [place], [place] [address]    Special Requests      BOTTLES DRAWN AEROBIC ONLY Blood Culture adequate volume Performed at [facility] [place] [address], [place], [place] [address]    Culture      NO GROWTH < 12 HOURS Performed at [facility] [address], [place], [place] [address]    Report Status  PENDING   Culture, blood (routine x 2)     Status: None (Preliminary result)   Collection Time: 03/09/21  6:24 PM   Specimen: BLOOD  Result Value Ref Range   Specimen Description      BLOOD RIGHT ANTECUBITAL Performed at [facility] [address], [place], [place] [address]    Special Requests      BOTTLES DRAWN AEROBIC ONLY Blood Culture adequate volume Performed at [facility] [address] [place], [place] [address]    Culture      NO GROWTH < 12 HOURS Performed at [facility] [address] [place], [place] [address]    Report Status PENDING   Culture, Urine     Status: None (Preliminary result)   Collection Time: 03/09/21  6:36 PM   Specimen: Urine, Random  Result Value Ref Range   Specimen Description      URINE, RANDOM Performed at [place] [address] [place], [place] [address]    Special Requests      NONE Performed at [facility], [place] [address] [place], [place] [address]    Culture PENDING    Report Status PENDING   Glucose, capillary     Status: Abnormal   Collection Time: 03/09/21  7:44 PM  Result Value Ref Range   Glucose-Capillary 66 (L) 70 - 99 mg/dL    Comment: Glucose reference range applies only to samples taken after fasting for at least 8 hours.  Glucose, capillary     Status: None   Collection Time: 03/09/21  8:31 PM  Result Value Ref Range   Glucose-Capillary 98 70 - 99 mg/dL    Comment: Glucose reference range applies only to samples taken after fasting for at least 8 hours.  Glucose, capillary     Status: None   Collection Time: 03/10/21  1:25 AM  Result Value Ref Range   Glucose-Capillary 94 70 - 99 mg/dL    Comment: Glucose reference range applies only to samples taken after fasting for at least 8 hours.  CBC     Status: Abnormal   Collection Time: 03/10/21  2:33 AM  Result Value Ref Range   WBC 7.2 4.0 - 10.5 K/uL   RBC 2.64 (L) 4.22 - 5.81 MIL/uL    Hemoglobin 7.9 (L) 13.0 - 17.0 g/dL   HCT 21.5 (L) 39.0 - 52.0 %   MCV 81.4 80.0 - 100.0 fL   MCH 29.9 26.0 - 34.0 pg   MCHC 36.7 (H) 30.0 - 36.0 g/dL   RDW 14.3 11.5 - 15.5 %   Platelets 123 (L) 150 - 400 K/uL   nRBC 1.4 (H) 0.0 - 0.2 %    Comment: Performed at [facility], [place] [address] [place], [place] [address]  Basic metabolic panel     Status: Abnormal   Collection Time: 03/10/21  2:33 AM  Result Value Ref Range   Sodium 144 135 - 145 mmol/L   Potassium 3.2 (L) 3.5 - 5.1 mmol/L   Chloride 110 98 - 111 mmol/L   CO2 23 22 - 32 mmol/L   Glucose, Bld 105 (H) 70 - 99 mg/dL    Comment: Glucose reference range applies only to samples taken after fasting for at least 8 hours.   BUN 61 (H) 8 - 23 mg/dL   Creatinine, Ser 2.10 (H) 0.61 - 1.24 mg/dL   Calcium 8.6 (L) 8.9 - 10.3 mg/dL   GFR, Estimated 33 (L) >60 mL/min    Comment: (NOTE) Calculated using the CKD-EPI Creatinine Equation (2021)    Anion gap 11 5 - 15    Comment: Performed at [facility], [place] [address], [place], [place] [address]  Vitamin B12     Status: None   Collection Time: 03/10/21  2:33 AM  Result Value Ref Range   Vitamin B-12 888 180 - 914 pg/mL    Comment: (NOTE) This assay is not validated for testing neonatal or myeloproliferative syndrome specimens for Vitamin B12 levels. Performed at [facility], [place] [address] [place], [place] [address]   Folate     Status: None   Collection Time: 03/10/21  2:33 AM  Result Value Ref Range   Folate 7.9 >5.9 ng/mL    Comment: Performed at [facility], [place] [address] [place], [place] [address]  Iron and TIBC     Status: Abnormal   Collection Time: 03/10/21  2:33 AM  Result Value Ref Range   Iron 20 (L) 45 - 182 ug/dL   TIBC 205 (L) 250 - 450 ug/dL   Saturation Ratios 10 (L) 17.9 - 39.5 %   UIBC 185 ug/dL    Comment: Performed at [facility], [place] [address],  [place], [place] [address]  Ferritin     Status: None   Collection Time: 03/10/21  2:33 AM  Result Value Ref Range   Ferritin 214 24 - 336 ng/mL    Comment: Performed at [facility], [place] [address] [place], [place] [address]  Reticulocytes     Status: Abnormal   Collection Time: 03/10/21  2:33 AM  Result Value Ref Range   Retic Ct Pct 1.0 0.4 - 3.1 %   RBC. 2.68 (L) 4.22 - 5.81 MIL/uL   Retic Count, Absolute 26.5 19.0 - 186.0 K/uL   Immature Retic Fract 12.1 2.3 - 15.9 %    Comment: Performed at [facility], [place] [address] [place], [place] [address]  Magnesium     Status: None   Collection Time: 03/10/21  2:33 AM  Result Value Ref Range   Magnesium 2.2 1.7 - 2.4 mg/dL    Comment: Performed at [facility], [place] [address], [place], [place] [address]  Glucose, capillary     Status: Abnormal   Collection Time: 03/10/21  3:16 AM  Result Value Ref Range   Glucose-Capillary 104 (H) 70 - 99 mg/dL    Comment: Glucose reference range applies only to samples taken after fasting for at least 8 hours.  Glucose, capillary     Status: Abnormal   Collection Time: 03/10/21  7:31 AM  Result Value Ref Range   Glucose-Capillary 102 (H) 70 - 99 mg/dL    Comment: Glucose reference range applies only to samples taken after fasting for at least 8 hours.    Labs:  CBC: Recent Labs  Lab 03/06/21 0424 03/07/21 0513 03/08/21 0520 03/09/21 0211 03/10/21 0233  WBC 3.0* 7.8 6.8 6.1 7.2  HGB 13.1 11.5* 10.4* 8.6* 7.9*  HCT 38.4* 34.1* 29.9* 23.7* 21.5*  MCV 90.6 91.7 85.9 82.9 81.4  PLT 227 140* 137* 114* 123*    Basic Metabolic Panel: Recent Labs  Lab 03/06/21 0424 03/07/21 0513 03/08/21 0520 03/09/21 0211 03/10/21 0233  NA 139 137 137 141 144  K 4.5 4.9 4.2 3.8 3.2*  CL 113* 111 111 112* 110  CO2 16* 15* 17* 18* 23  GLUCOSE 144* 66* 99 90 105*  BUN 22 38* 49* 60* 61*  CREATININE 1.91* 2.81* 2.97* 2.93* 2.10*  CALCIUM 8.2* 7.9*  8.3* 8.7* 8.6*  MG  --   --  1.8 1.8 2.2  PHOS  --   --  3.9  --   --    GFR Estimated Creatinine Clearance: 33.3 mL/min (A) (by C-G formula based on SCr of 2.1 mg/dL (H)). Liver Function Tests: Recent Labs  Lab 03/08/21 0520 03/09/21 0211  AST 41 35  ALT 30 30  ALKPHOS 62 41  BILITOT 1.1 0.9  PROT 5.4* 5.8*  ALBUMIN 2.5* 2.7*   No results for input(s): LIPASE, AMYLASE in the last 168 hours. No results for input(s): AMMONIA in the last 168 hours. Coagulation profile No results for input(s): INR, PROTIME in the last 168 hours.  Cardiac Enzymes: No results for input(s): CKTOTAL, CKMB, CKMBINDEX, TROPONINI in the last 168 hours. BNP: Invalid input(s): POCBNP CBG: Recent Labs  Lab 03/09/21 1944 03/09/21  2031 03/10/21 0125 03/10/21 0316 03/10/21 0731  GLUCAP 66* 98 94 104* 102*   D-Dimer Recent Labs    03/09/21 1053  DDIMER 10.91*   Hgb A1c No results for input(s): HGBA1C in the last 72 hours. Lipid Profile No results for input(s): CHOL, HDL, LDLCALC, TRIG, CHOLHDL, LDLDIRECT in the last 72 hours. Thyroid function studies No results for input(s): TSH, T4TOTAL, T3FREE, THYROIDAB in the last 72 hours.  Invalid input(s): FREET3 Anemia work up Recent Labs    03/10/21 0233  VITAMINB12 888  FOLATE 7.9  FERRITIN 214  TIBC 205*  IRON 20*  RETICCTPCT 1.0   Urinalysis No results found for: COLORURINE, APPEARANCEUR, LABSPEC, PHURINE, GLUCOSEU, [place], [place], KETONESUR, PROTEINUR, UROBILINOGEN, NITRITE, [place]  Microbiology Recent Results (from the past 240 hour(s))  SARS CORONAVIRUS 2 (TAT 6-24 HRS) Nasopharyngeal Nasopharyngeal Swab     Status: None   Collection Time: 03/03/21 11:13 AM   Specimen: Nasopharyngeal Swab  Result Value Ref Range Status   SARS Coronavirus 2 NEGATIVE NEGATIVE Final    Comment: (NOTE) SARS-CoV-2 target nucleic acids are NOT DETECTED.  The SARS-CoV-2 RNA is generally detectable in upper and lower respiratory specimens  during the acute phase of infection. Negative results do not preclude SARS-CoV-2 infection, do not rule out co-infections with other pathogens, and should not be used as the sole basis for treatment or other patient management decisions. Negative results must be combined with clinical observations, patient history, and epidemiological information. The expected result is Negative.  Fact Sheet for Patients: SugarRoll.be  Fact Sheet for Healthcare Providers: https://www.woods-mathews.com/  This test is not yet approved or cleared by the [place] FDA and  has been authorized for detection and/or diagnosis of SARS-CoV-2 by FDA under an Emergency Use Authorization (EUA). This EUA will remain  in effect (meaning this test can be used) for the duration of the COVID-19 declaration under Se ction 564(b)(1) of the Act, 21 U.S.C. section 360bbb-3(b)(1), unless the authorization is terminated or revoked sooner.  Performed at [facility], [place] [address], [place], [place] [address]   MRSA PCR Screening     Status: None   Collection Time: 03/07/21  4:00 AM   Specimen: Nasopharyngeal  Result Value Ref Range Status   MRSA by PCR NEGATIVE NEGATIVE Final    Comment:        The GeneXpert MRSA Assay (FDA approved for NASAL specimens only), is one component of a comprehensive MRSA colonization surveillance program. It is not intended to diagnose MRSA infection nor to guide or monitor treatment for MRSA infections. Performed at [facility], [place] [address], [place], [place] [address]   Culture, blood (routine x 2)     Status: None (Preliminary result)   Collection Time: 03/09/21  6:24 PM   Specimen: BLOOD  Result Value Ref Range Status   Specimen Description   Final    BLOOD BLOOD RIGHT HAND Performed at [facility] [address] [place], [place] [address]    Special Requests   Final    BOTTLES  DRAWN AEROBIC ONLY Blood Culture adequate volume Performed at [facility] [address], [place], [place] [address]    Culture   Final    NO GROWTH < 12 HOURS Performed at [facility] [address], [place], [place] [address]    Report Status PENDING  Incomplete  Culture, blood (routine x 2)     Status: None (Preliminary result)   Collection  Time: 03/09/21  6:24 PM   Specimen: BLOOD  Result Value Ref Range Status   Specimen Description   Final    BLOOD RIGHT ANTECUBITAL Performed at [facility], [address], [place], [place] [address]    Special Requests   Final    BOTTLES DRAWN AEROBIC ONLY Blood Culture adequate volume Performed at [facility], [address] [place], [place] [address]    Culture   Final    NO GROWTH < 12 HOURS Performed at [facility], [address], [place], [place] [address]    Report Status PENDING  Incomplete  Culture, Urine     Status: None (Preliminary result)   Collection Time: 03/09/21  6:36 PM   Specimen: Urine, Random  Result Value Ref Range Status   Specimen Description   Final    URINE, RANDOM Performed at [facility], [address], [place], [place] [address]    Special Requests   Final    NONE Performed at [facility], [address], [place], [place] [address]    Culture PENDING  Incomplete   Report Status PENDING  Incomplete    Inpatient Medications:   Scheduled Meds: . bacitracin   Topical BID  . Chlorhexidine Gluconate Cloth  6 each Topical Daily  . heparin injection (subcutaneous)  5,000 Units Subcutaneous Q8H  . lip balm  1 application Topical BID  . mouth rinse  15 mL Mouth Rinse BID  . [START ON 03/11/2021] melatonin  3 mg Oral QHS  . metoprolol tartrate  2.5 mg Intravenous Q6H   PRN Meds: sodium chloride, acetaminophen, albumin human, albuterol, alum & mag hydroxide-simeth, dextrose, diphenhydrAMINE, fentaNYL  (SUBLIMAZE) injection, haloperidol lactate, magic mouthwash, methocarbamol (ROBAXIN) IV, metoprolol tartrate, ondansetron **OR** ondansetron (ZOFRAN) IV, simethicone Continuous Infusions: . sodium chloride    . acetaminophen    . albumin human    . iron dextran (INFED/DEXFERRUM) infusion    . methocarbamol (ROBAXIN) IV    . piperacillin-tazobactam (ZOSYN)  IV 3.375 g (03/10/21 0811)  . potassium chloride 10 mEq (03/10/21 0916)  . sodium bicarbonate 150 mEq in D5W infusion 50 mL/hr at 03/10/21 0800    Radiological Exams on Admission: DG Abd 1 View  Result Date: 03/09/2021 CLINICAL DATA:  NG tube placement EXAM: ABDOMEN - 1 VIEW COMPARISON:  None. FINDINGS: NG tube appears adequately positioned in the stomach. Paucity of bowel gas. IMPRESSION: NG tube adequately positioned in the stomach. Electronically Signed   By: Bary Richard M.D.   On: 03/09/2021 18:33   US RENAL  Result Date: 03/09/2021 CLINICAL DATA:  Acute kidney injury EXAM: RENAL / URINARY TRACT ULTRASOUND COMPLETE COMPARISON:  None. FINDINGS: Right Kidney: Renal measurements: 11.5 x 5.3 x 5.6 cm = volume: 176 mL. Echogenicity within normal limits. RIGHT renal cyst measures 2.4 cm. No suspicious mass or hydronephrosis. Left Kidney: Renal measurements: 11.5 x 6.5 x 5 cm = volume: 195 mL. Echogenicity within normal limits. No mass or hydronephrosis visualized. Bladder: Decompressed by Foley catheter. Other: None. IMPRESSION: No acute or significant findings.  No hydronephrosis. Electronically Signed   By: Bary Richard M.D.   On: 03/09/2021 18:35   DG CHEST PORT 1 VIEW  Result Date: 03/09/2021 CLINICAL DATA:  Increasing shortness of breath, wheezing, known rectal cancer EXAM: PORTABLE CHEST 1 VIEW COMPARISON:  03/07/2021 FINDINGS: Single frontal view of the chest demonstrates stable right chest wall port. Cardiac silhouette is stable. No change in bilateral veiling  opacities consistent with consolidation and effusions. No pneumothorax.  IMPRESSION: 1. Stable bibasilar consolidation and effusions. Electronically Signed   By: Randa Ngo M.D.   On: 03/09/2021 17:36   DG Abd Portable 1V  Result Date: 03/08/2021 CLINICAL DATA:  Ileus. EXAM: PORTABLE ABDOMEN - 1 VIEW COMPARISON:  None. FINDINGS: There is a paucity of bowel gas limiting evaluation. A surgical drain terminates in the pelvis. Bilateral pleural effusions with underlying atelectasis. No other abnormalities. IMPRESSION: 1. There is a surgical drain terminating in the pelvis. 2. The lack of significant bowel gas limits evaluation for ileus or obstruction. No dilated loops are seen. 3. Small bilateral pleural effusions with underlying atelectasis. Electronically Signed   By: Dorise Bullion III M.D   On: 03/08/2021 14:55   VAS [place] LOWER EXTREMITY VENOUS (DVT)  Result Date: 03/09/2021  Lower Venous DVT Study Patient Name:  ERAY COLLINSON  Date of Exam:   03/09/2021 Medical Rec #: [id number]      Accession #:    [id number] Date of Birth: 1951/08/21     Patient Gender: M Patient Age:   69Y Exam Location:  [facility] Procedure:      VAS [place] LOWER EXTREMITY VENOUS (DVT) Referring Phys: [id number] Jonnie Finner --------------------------------------------------------------------------------  Indications: Edema.  Comparison Study: no prior Performing Technologist: Abram Sander RVS  Examination Guidelines: A complete evaluation includes B-mode imaging, spectral Doppler, color Doppler, and power Doppler as needed of all accessible portions of each vessel. Bilateral testing is considered an integral part of a complete examination. Limited examinations for reoccurring indications may be performed as noted. The reflux portion of the exam is performed with the patient in reverse Trendelenburg.  +---------+---------------+---------+-----------+----------+--------------+ RIGHT    CompressibilityPhasicitySpontaneityPropertiesThrombus Aging  +---------+---------------+---------+-----------+----------+--------------+ CFV      Full           Yes      Yes                                 +---------+---------------+---------+-----------+----------+--------------+ SFJ      Full                                                        +---------+---------------+---------+-----------+----------+--------------+ FV Prox  Full                                                        +---------+---------------+---------+-----------+----------+--------------+ FV Mid   Full                                                        +---------+---------------+---------+-----------+----------+--------------+ FV DistalFull                                                        +---------+---------------+---------+-----------+----------+--------------+ PFV  Full                                                        +---------+---------------+---------+-----------+----------+--------------+ POP      Full           Yes      Yes                                 +---------+---------------+---------+-----------+----------+--------------+ PTV      Full                                                        +---------+---------------+---------+-----------+----------+--------------+ PERO     Full                                                        +---------+---------------+---------+-----------+----------+--------------+   +---------+---------------+---------+-----------+----------+--------------+ LEFT     CompressibilityPhasicitySpontaneityPropertiesThrombus Aging +---------+---------------+---------+-----------+----------+--------------+ CFV      Full           Yes      Yes                                 +---------+---------------+---------+-----------+----------+--------------+ SFJ      Full                                                         +---------+---------------+---------+-----------+----------+--------------+ FV Prox  Full                                                        +---------+---------------+---------+-----------+----------+--------------+ FV Mid   Full                                                        +---------+---------------+---------+-----------+----------+--------------+ FV DistalFull                                                        +---------+---------------+---------+-----------+----------+--------------+ PFV      Full                                                        +---------+---------------+---------+-----------+----------+--------------+  POP      Full           Yes      Yes                                 +---------+---------------+---------+-----------+----------+--------------+ PTV      Full                                                        +---------+---------------+---------+-----------+----------+--------------+ PERO     Full                                                        +---------+---------------+---------+-----------+----------+--------------+     Summary: BILATERAL: - No evidence of deep vein thrombosis seen in the lower extremities, bilaterally. - No evidence of superficial venous thrombosis in the lower extremities, bilaterally. -No evidence of popliteal cyst, bilaterally.   *See table(s) above for measurements and observations. Electronically signed by Harold Barban MD on 03/09/2021 at 4:26:21 PM.    Final    [place] EKG SITE RITE  Result Date: 03/10/2021 If Site Rite image not attached, placement could not be confirmed due to current cardiac rhythm.   Thank you for this consultation.  Our [facility] hospitalist team will follow the patient with you.   Time spent: Greater than 50% of the 35 minute visit was spent in counseling/coordination of care for the patient as laid out in the A&P.   Dwyane Dee M.D. [facility] 03/10/2021,  9:18 AM Pager: Secure chat

## 2021-03-10 NOTE — Progress Notes (Signed)
This RN was providing oral care for pt.  Noticed mucus at the back of pt's mouth.  With assistance, RN was able to suction large amount of thick mucus from pt mouth and throat.  Mucus brown, dark tan in color.  Pt tolerated well, VS stable throughout.  MD paged.  RN will continue to carefully monitor.

## 2021-03-10 NOTE — TOC Progression Note (Signed)
Transition of Care [facility]) - Progression Note    Patient Details  Name: Shane [name] MRN: [id number] Date of Birth: [birth year]  Transition of [facility]) CM/SW Contact  Leeroy Cha, RN Phone Number: 03/10/2021, 8:38 AM  Clinical Narrative:    Acute respiratory distress - this appeared to be related to ileus and bibasilar atelectasis.  No reason to suspect PE - Likely other causes of elevated D-dimer in the situation including malignancy.  Improved overnight and resolved as of AM 5/2, currently comfortable on room air. - Continue IS and push bronchial hygiene. - Keep NGT to LIS. - Mobilize as able. - Not a candidate for BiPAP currently.  Ileus. - Continue NGT to LIS for decompression.  AKI - creatinine has plateaued and he has decent urine output so expect this to turn around eventually. - Continue supportive care. - Avoid contrast and other nephrotoxins.  Hypokalemia. - 4 runs K already ordered. - Follow BMP.  Delirium - likely ICU delirium. - OK to use Haldol as QTC permits.  Respiratory status much improved.  Good output from NGT.  Expect ileus to resolve gradually.   Nothing further to add from our standpoint.  PCCM will sign off.  Please do not hesitate to call us back if we can be of any further assistance.   PLAN: FOLLOWING FOR TOC NEEDS AND DC NEEDS.   Expected Discharge Plan: [place] Barriers to Discharge: Continued Medical Work up  Expected Discharge Plan and Services Expected Discharge Plan: [place]   Discharge Planning Services: CM Consult   Living arrangements for the past 2 months: Single Family Home                                       Social Determinants of Health (SDOH) Interventions    Readmission Risk Interventions No flowsheet data found.

## 2021-03-10 NOTE — Progress Notes (Signed)
Shane [name] [id number] July 29, 1951  CARE TEAM:  PCP: Olive Bass, FNP  Outpatient Care Team: Patient Care Team: Olive Bass, FNP as PCP - General (Internal Medicine) Truett Perna Leighton Roach, MD as Consulting Physician (Oncology) Radonna Ricker, RN as Oncology Nurse Navigator Heilingoetter, Johnette Abraham, PA-C as Physician Assistant (Physician Assistant) Romie Levee, MD as Consulting Physician (General Surgery) Serena Colonel, MD as Consulting Physician (Otolaryngology) Shellia Cleverly, DO as Consulting Physician (Gastroenterology)  Inpatient Treatment Team: Treatment Team: Attending Provider: Romie Levee, MD; Technician: Harlow Ohms, NT; WOC Nurse: Teressa Lower Bonney Aid, RN; Consulting Physician: Karie Soda, MD; Rounding Team: Pccm, Md, MD; Registered Nurse: Anthoney Harada, RN (Inactive); Technician: Meda Klinefelter, NT; Consulting Physician: Montez Morita, Md, MD; Consulting Physician: Merlyn Albert, MD; Consulting Physician: Lewie Chamber, MD   Problem List:   Principal Problem:   Rectal carcinoma [facility]) Active Problems:   Degenerative cervical disc   Abnormality of gait   Iron deficiency anemia   Port-A-Cath in place   AKI (acute kidney injury) (HCC)   Hypercholesteremia   Pulmonary nodules   Hypoxia   Phimosis of penis s/p dilitation 03/05/2021   Delirium   Tachypnea   Acute respiratory distress   Ileus (HCC)   Positive D dimer   5 Days Post-Op   03/05/2021  POST-OPERATIVE DIAGNOSIS:  RECTAL CANCER  PROCEDURE:   XI ROBOTIC ASSISTED LOWER ANTERIOR RESECTION WITH END COLOSTOMY MOBILIZATION OF THE SPLENIC FLEXURE    SURGEON: Romie Levee, MD  03/05/2021 Postoperative diagnosis: Unable to pass catheter due to tight foreskin  Surgery: Examination under anesthesia; spreading of phimosis with hemostat; insertion of catheter  Surgeon: Dr. Lorin Picket Macdiarmid     Assessment  GUARDED but improving  [facility] Stay = 5  days)  Plan:  NGT decompression.  Start TNA for expected persistent ileus.  Place PICC line.  Creatinine elevated but nonoliguric and improving so I do not think he is at risk for dialysis.  Sips for now.   -Hypoxia/tachypnea from abdominal distention -tachypnea improving.  No worsening hypoxia.  Reassuring.  Doubt pneumonia.  No severe hypoxia.  Internal medicine requested pulmonary evaluation.  I think they are getting a pulmonary VQ scan just in case but I am skeptical that the patient has a PE.  .  -keep in ICU  -He has no peritonitis and drainage serous = not c/w leak/delated perforation.  Follow closely on IV ABx.  CT scan to r/o abscess if not better by POD #5.  He has not had a fever spike nor worsening white count.  Zosyn should cover any intra-abdominal & pulmonary issues at this time.  -With his renal failure no longer oliguric, he was Lasix responsive.  Consider repeat dosing versus following expectantly since he is -4 L yesterday.  See what medicine thinks.  -IVF.  Switched to bicarb.  Do have maintenance with albumin bolus backup since he seemed responsive to albumin bolus yesterday.    -AKI - low but not oliguric.  Cr now trending down.  I would hold off on any nephrology consult for now since creatinine plateauing, starting to diurese, and medicine coming on board first.  Patient not a good candidate for dialysis at this point anyway  -Confusion from prolonged anesthesia.  Most likely consistent with ICU to confusion/psychosis.  He seemed better now that his abdominal distention has waned and he can breathe more comfortably.  Exhausted since he has not been able to sleep well.  No evidence of hypercarbia.  We switched  his narcotics around -seems less sedated on fentanyl as opposed to Dilaudid.  Not asking for much narcotics..  Not having any more hypoglycemia issues.  He is able to follow commands and does not have any focal neurological deficits to my examination, arguing  against any TIA/stroke.  I have paged [place] medicine to be involved to see if we are missing anything & help follow.   Dr Marylyn Ishihara to see.  Continue scheduled Tylenol.  PRN Robaxin, low-dose femtanyl.  -tachycardia - low dose metoprolol with very low threshold to hold if is hypotensive.  Heart rate in the 110s now as opposed to 130s.  An improvement but still concerning.  Cardiology consult if goes into A. fib.  We will see.  -VTE prophylaxis- SCDs, etc.  Switch from enoxaparin to heparin w inc Cr.  Could fully anticoagulate if there is strong suspicion but I doubt any DVT, MI, or PE at this time.  -f/u Pathology from resection.  Suspect at least partial pathologic response and given the tumor shrinkage and massive fibrosis.  -TNA if not opened up by POD#7 - hold off for now w fluid overload & renal issues  -mobilize as tolerated to help recovery.  See if we can get him up to a chair.  Disposition:  Disposition:  The patient is from: Home  Anticipate discharge to:  [facility] (SNF)  Anticipated Date of Discharge is:  May 8,2022   Barriers to discharge:  Pending Clinical improvement (more likely than not)  Patient currently is NOT MEDICALLY STABLE for discharge from the hospital from a surgery standpoint.    I updated the patient's status to the patient  Recommendations were made.  Questions were answered.  They expressed understanding & appreciation.    35 minutes spent in review, evaluation, examination, counseling, and coordination of care.   I have reviewed this patient's available data, including medical history, events of note, physical examination and test results as part of my evaluation.  A significant portion of that time was spent in counseling.  Care during the described time interval was provided by me.  03/10/2021    Subjective: (Chief complaint)  Internal medicine and pulmonary consultation appreciated.  ICU help appreciated.  Patient relented and  nasogastric tube placement after refusing 2 more times.  Large volume removed.    Patient feels less abdominal discomfort.  Having bowel movements.  Feels more comfortable this morning but still very tired.  Objective:  Vital signs:  Vitals:   03/10/21 0300 03/10/21 0400 03/10/21 0500 03/10/21 0600  BP: (!) 128/59 (!) 123/54 (!) 134/51 (!) 121/57  Pulse: (!) 112 100 70   Resp: (!) 24 (!) 21 (!) 32 (!) 25  Temp:      TempSrc:      SpO2: 94% 97% 95% 92%  Weight:   76.2 kg     Last BM Date: 03/09/21  Intake/Output   Yesterday:  05/01 0701 - 05/02 0700 In: 2060.6 [I.V.:1201.5; IV Piggyback:859.1] Out: 8275 [Urine:4800; Emesis/NG output:2950; Drains:25; Stool:500] This shift:  Total I/O In: 1119 [I.V.:481; IV Piggyback:637.9] Out: [id number] [Urine:1515; Emesis/NG output:2650; Drains:15; Stool:500]  Bowel function:  Flatus: YES  BM:  YES  Drain: Serous   Physical Exam:  General: Pt awake/alert in mild acute distress.  Tired Eyes: PERRL, normal EOM.  Sclera clear.  No icterus Neuro: CN II-XII intact w/o focal sensory/motor deficits. Lymph: No head/neck/groin lymphadenopathy Psych:  No delerium/psychosis/paranoia.  Oriented x 4 HENT: Normocephalic, Mucus membranes moist.  No thrush  Neck: Supple, No tracheal deviation.  No obvious thyromegaly Chest: No pain to chest wall compression.  Fair respiratory excursion.  No audible wheezing CV:  Pulses intact.  Regular rhythm.  No major extremity edema MS: Normal AROM mjr joints.  No obvious deformity  Abdomen: Somewhat firm.  Moderately distended.  Mild tenderness to deep palpation L flank only.  No rebound tenderness or guarding.  No pain with cough.  NO PERITONITIS.  Ostomy pink with gas & liquid stool in bag.  Incisions clean,dry, & intact with no purulence or bleeding.  No cellulitis..    GU: Foley in place.  Bilateral large scrotal hydroceles stable.  No hernias. Ext:  No deformity.  2+ mjr edema.  No cyanosis Skin: No  petechiae / purpurea.  No major sores.  Warm and dry    Results:   Cultures: Recent Results (from the past 720 hour(s))  SARS CORONAVIRUS 2 (TAT 6-24 HRS) Nasopharyngeal Nasopharyngeal Swab     Status: None   Collection Time: 03/03/21 11:13 AM   Specimen: Nasopharyngeal Swab  Result Value Ref Range Status   SARS Coronavirus 2 NEGATIVE NEGATIVE Final    Comment: (NOTE) SARS-CoV-2 target nucleic acids are NOT DETECTED.  The SARS-CoV-2 RNA is generally detectable in upper and lower respiratory specimens during the acute phase of infection. Negative results do not preclude SARS-CoV-2 infection, do not rule out co-infections with other pathogens, and should not be used as the sole basis for treatment or other patient management decisions. Negative results must be combined with clinical observations, patient history, and epidemiological information. The expected result is Negative.  Fact Sheet for Patients: SugarRoll.be  Fact Sheet for Healthcare Providers: https://www.woods-mathews.com/  This test is not yet approved or cleared by the [place] FDA and  has been authorized for detection and/or diagnosis of SARS-CoV-2 by FDA under an Emergency Use Authorization (EUA). This EUA will remain  in effect (meaning this test can be used) for the duration of the COVID-19 declaration under Se ction 564(b)(1) of the Act, 21 U.S.C. section 360bbb-3(b)(1), unless the authorization is terminated or revoked sooner.  Performed at [facility], [place] [address] [place], [place] [address]   MRSA PCR Screening     Status: None   Collection Time: 03/07/21  4:00 AM   Specimen: Nasopharyngeal  Result Value Ref Range Status   MRSA by PCR NEGATIVE NEGATIVE Final    Comment:        The GeneXpert MRSA Assay (FDA approved for NASAL specimens only), is one component of a comprehensive MRSA colonization surveillance program. It is not intended to  diagnose MRSA infection nor to guide or monitor treatment for MRSA infections. Performed at [facility], [place] [address], [place], [place] [address]   Culture, Urine     Status: None (Preliminary result)   Collection Time: 03/09/21  6:36 PM   Specimen: Urine, Random  Result Value Ref Range Status   Specimen Description   Final    URINE, RANDOM Performed at [facility], [place] [address] [place], [place] [address]    Special Requests   Final    NONE Performed at [facility], [place] [address], [place], [place] [address]    Culture PENDING  Incomplete   Report Status PENDING  Incomplete    Labs: Results for orders placed or performed during the hospital encounter of 03/05/21 (from the past 48 hour(s))  Glucose, capillary     Status: None  Collection Time: 03/08/21  7:20 AM  Result Value Ref Range   Glucose-Capillary 75 70 - 99 mg/dL    Comment: Glucose reference range applies only to samples taken after fasting for at least 8 hours.  Glucose, capillary     Status: None   Collection Time: 03/08/21 11:25 AM  Result Value Ref Range   Glucose-Capillary 88 70 - 99 mg/dL    Comment: Glucose reference range applies only to samples taken after fasting for at least 8 hours.  Blood gas, arterial     Status: Abnormal   Collection Time: 03/08/21  1:13 PM  Result Value Ref Range   FIO2 32.00    O2 Content 3.0 L/min   Delivery systems NASAL CANNULA    pH, Arterial 7.362 7.350 - 7.450   pCO2 arterial 26.4 (L) 32.0 - 48.0 mmHg   pO2, Arterial 93.2 83.0 - 108.0 mmHg   Bicarbonate 14.6 (L) 20.0 - 28.0 mmol/L   Acid-base deficit 9.3 (H) 0.0 - 2.0 mmol/L   O2 Saturation 96.7 %   Patient temperature 98.6    Collection site RIGHT RADIAL    Drawn by [id number]    Allens test (pass/fail) PASS PASS    Comment: Performed at [place] [address] [place], [place] [address]  Glucose, capillary     Status: None   Collection Time:  03/08/21  3:59 PM  Result Value Ref Range   Glucose-Capillary 95 70 - 99 mg/dL    Comment: Glucose reference range applies only to samples taken after fasting for at least 8 hours.  Glucose, capillary     Status: None   Collection Time: 03/08/21  7:27 PM  Result Value Ref Range   Glucose-Capillary 93 70 - 99 mg/dL    Comment: Glucose reference range applies only to samples taken after fasting for at least 8 hours.  Glucose, capillary     Status: None   Collection Time: 03/08/21 11:53 PM  Result Value Ref Range   Glucose-Capillary 92 70 - 99 mg/dL    Comment: Glucose reference range applies only to samples taken after fasting for at least 8 hours.  CBC     Status: Abnormal   Collection Time: 03/09/21  2:11 AM  Result Value Ref Range   WBC 6.1 4.0 - 10.5 K/uL   RBC 2.86 (L) 4.22 - 5.81 MIL/uL   Hemoglobin 8.6 (L) 13.0 - 17.0 g/dL   HCT 23.7 (L) 39.0 - 52.0 %   MCV 82.9 80.0 - 100.0 fL   MCH 30.1 26.0 - 34.0 pg   MCHC 36.3 (H) 30.0 - 36.0 g/dL   RDW 13.9 11.5 - 15.5 %   Platelets 114 (L) 150 - 400 K/uL    Comment: Immature Platelet Fraction may be clinically indicated, consider ordering this additional test [id number] PLATELET COUNT CONFIRMED BY SMEAR    nRBC 0.8 (H) 0.0 - 0.2 %    Comment: Performed at [facility], [place] [address] [place], [place] [id number]  Basic metabolic panel     Status: Abnormal   Collection Time: 03/09/21  2:11 AM  Result Value Ref Range   Sodium 141 135 - 145 mmol/L   Potassium 3.8 3.5 - 5.1 mmol/L   Chloride 112 (H) 98 - 111 mmol/L   CO2 18 (L) 22 - 32 mmol/L   Glucose, Bld 90 70 - 99 mg/dL    Comment: Glucose reference range applies only to samples taken after fasting for at least 8  hours.   BUN 60 (H) 8 - 23 mg/dL   Creatinine, Ser 2.93 (H) 0.61 - 1.24 mg/dL   Calcium 8.7 (L) 8.9 - 10.3 mg/dL   GFR, Estimated 22 (L) >60 mL/min    Comment: (NOTE) Calculated using the CKD-EPI Creatinine Equation (2021)    Anion gap 11 5 - 15     Comment: Performed at [facility], [place] [address], [place], [place] [address]  Magnesium     Status: None   Collection Time: 03/09/21  2:11 AM  Result Value Ref Range   Magnesium 1.8 1.7 - 2.4 mg/dL    Comment: Performed at [facility], [place] [address] [place], [place] [address]  Hepatic function panel     Status: Abnormal   Collection Time: 03/09/21  2:11 AM  Result Value Ref Range   Total Protein 5.8 (L) 6.5 - 8.1 g/dL   Albumin 2.7 (L) 3.5 - 5.0 g/dL   AST 35 15 - 41 U/L   ALT 30 0 - 44 U/L   Alkaline Phosphatase 41 38 - 126 U/L   Total Bilirubin 0.9 0.3 - 1.2 mg/dL   Bilirubin, Direct 0.3 (H) 0.0 - 0.2 mg/dL   Indirect Bilirubin 0.6 0.3 - 0.9 mg/dL    Comment: Performed at [facility], [place] [address] [place], [place] [address]  Glucose, capillary     Status: None   Collection Time: 03/09/21  3:50 AM  Result Value Ref Range   Glucose-Capillary 93 70 - 99 mg/dL    Comment: Glucose reference range applies only to samples taken after fasting for at least 8 hours.  Glucose, capillary     Status: None   Collection Time: 03/09/21  7:20 AM  Result Value Ref Range   Glucose-Capillary 93 70 - 99 mg/dL    Comment: Glucose reference range applies only to samples taken after fasting for at least 8 hours.  D-dimer, quantitative     Status: Abnormal   Collection Time: 03/09/21 10:53 AM  Result Value Ref Range   D-Dimer, Quant 10.91 (H) 0.00 - 0.50 ug/mL-FEU    Comment: (NOTE) At the manufacturer cut-off value of 0.5 g/mL FEU, this assay has a negative predictive value of 95-100%.This assay is intended for use in conjunction with a clinical pretest probability (PTP) assessment model to exclude pulmonary embolism (PE) and deep venous thrombosis (DVT) in outpatients suspected of PE or DVT. Results should be correlated with clinical presentation. Performed at [facility], [place] [address], [place],  [place] [address]   Glucose, capillary     Status: None   Collection Time: 03/09/21 11:42 AM  Result Value Ref Range   Glucose-Capillary 86 70 - 99 mg/dL    Comment: Glucose reference range applies only to samples taken after fasting for at least 8 hours.  Glucose, capillary     Status: None   Collection Time: 03/09/21  3:31 PM  Result Value Ref Range   Glucose-Capillary 88 70 - 99 mg/dL    Comment: Glucose reference range applies only to samples taken after fasting for at least 8 hours.  Culture, Urine     Status: None (Preliminary result)   Collection Time: 03/09/21  6:36 PM   Specimen: Urine, Random  Result Value Ref Range   Specimen Description      URINE, RANDOM Performed at [place] [address] [place], [place] [address]    Special Requests  NONE Performed at [facility], [place] [address] [place], [place] [address]    Culture PENDING    Report Status PENDING   Glucose, capillary     Status: Abnormal   Collection Time: 03/09/21  7:44 PM  Result Value Ref Range   Glucose-Capillary 66 (L) 70 - 99 mg/dL    Comment: Glucose reference range applies only to samples taken after fasting for at least 8 hours.  Glucose, capillary     Status: None   Collection Time: 03/09/21  8:31 PM  Result Value Ref Range   Glucose-Capillary 98 70 - 99 mg/dL    Comment: Glucose reference range applies only to samples taken after fasting for at least 8 hours.  Glucose, capillary     Status: None   Collection Time: 03/10/21  1:25 AM  Result Value Ref Range   Glucose-Capillary 94 70 - 99 mg/dL    Comment: Glucose reference range applies only to samples taken after fasting for at least 8 hours.  CBC     Status: Abnormal   Collection Time: 03/10/21  2:33 AM  Result Value Ref Range   WBC 7.2 4.0 - 10.5 K/uL   RBC 2.64 (L) 4.22 - 5.81 MIL/uL   Hemoglobin 7.9 (L) 13.0 - 17.0 g/dL   HCT 21.5 (L) 39.0 - 52.0 %   MCV 81.4 80.0 - 100.0 fL   MCH 29.9 26.0 - 34.0 pg   MCHC  36.7 (H) 30.0 - 36.0 g/dL   RDW 14.3 11.5 - 15.5 %   Platelets 123 (L) 150 - 400 K/uL   nRBC 1.4 (H) 0.0 - 0.2 %    Comment: Performed at [facility], [place] [address] [place], [place] [address]  Basic metabolic panel     Status: Abnormal   Collection Time: 03/10/21  2:33 AM  Result Value Ref Range   Sodium 144 135 - 145 mmol/L   Potassium 3.2 (L) 3.5 - 5.1 mmol/L   Chloride 110 98 - 111 mmol/L   CO2 23 22 - 32 mmol/L   Glucose, Bld 105 (H) 70 - 99 mg/dL    Comment: Glucose reference range applies only to samples taken after fasting for at least 8 hours.   BUN 61 (H) 8 - 23 mg/dL   Creatinine, Ser 2.10 (H) 0.61 - 1.24 mg/dL   Calcium 8.6 (L) 8.9 - 10.3 mg/dL   GFR, Estimated 33 (L) >60 mL/min    Comment: (NOTE) Calculated using the CKD-EPI Creatinine Equation (2021)    Anion gap 11 5 - 15    Comment: Performed at [facility], [place] [address] [place], [place] [address]  Vitamin B12     Status: None   Collection Time: 03/10/21  2:33 AM  Result Value Ref Range   Vitamin B-12 888 180 - 914 pg/mL    Comment: (NOTE) This assay is not validated for testing neonatal or myeloproliferative syndrome specimens for Vitamin B12 levels. Performed at [facility], [place] [address] [place], [place] [address]   Folate     Status: None   Collection Time: 03/10/21  2:33 AM  Result Value Ref Range   Folate 7.9 >5.9 ng/mL    Comment: Performed at [facility], [place] [address] [place], [place] [address]  Iron and TIBC     Status: Abnormal   Collection Time: 03/10/21  2:33 AM  Result Value Ref Range   Iron 20 (L) 45 - 182 ug/dL  TIBC 205 (L) 250 - 450 ug/dL   Saturation Ratios 10 (L) 17.9 - 39.5 %   UIBC 185 ug/dL    Comment: Performed at [facility], [place] [address], [place], [place] [address]  Ferritin     Status: None   Collection Time: 03/10/21  2:33 AM  Result Value Ref Range   Ferritin 214  24 - 336 ng/mL    Comment: Performed at [facility], [place] [address], [place], [place] [address]  Reticulocytes     Status: Abnormal   Collection Time: 03/10/21  2:33 AM  Result Value Ref Range   Retic Ct Pct 1.0 0.4 - 3.1 %   RBC. 2.68 (L) 4.22 - 5.81 MIL/uL   Retic Count, Absolute 26.5 19.0 - 186.0 K/uL   Immature Retic Fract 12.1 2.3 - 15.9 %    Comment: Performed at [facility], [place] [address], [place], [place] [address]  Magnesium     Status: None   Collection Time: 03/10/21  2:33 AM  Result Value Ref Range   Magnesium 2.2 1.7 - 2.4 mg/dL    Comment: Performed at [facility], [place] [address] [place], [place] [address]  Glucose, capillary     Status: Abnormal   Collection Time: 03/10/21  3:16 AM  Result Value Ref Range   Glucose-Capillary 104 (H) 70 - 99 mg/dL    Comment: Glucose reference range applies only to samples taken after fasting for at least 8 hours.    Imaging / Studies: DG Abd 1 View  Result Date: 03/09/2021 CLINICAL DATA:  NG tube placement EXAM: ABDOMEN - 1 VIEW COMPARISON:  None. FINDINGS: NG tube appears adequately positioned in the stomach. Paucity of bowel gas. IMPRESSION: NG tube adequately positioned in the stomach. Electronically Signed   By: Franki Cabot M.D.   On: 03/09/2021 18:33   US RENAL  Result Date: 03/09/2021 CLINICAL DATA:  Acute kidney injury EXAM: RENAL / URINARY TRACT ULTRASOUND COMPLETE COMPARISON:  None. FINDINGS: Right Kidney: Renal measurements: 11.5 x 5.3 x 5.6 cm = volume: 176 mL. Echogenicity within normal limits. RIGHT renal cyst measures 2.4 cm. No suspicious mass or hydronephrosis. Left Kidney: Renal measurements: 11.5 x 6.5 x 5 cm = volume: 195 mL. Echogenicity within normal limits. No mass or hydronephrosis visualized. Bladder: Decompressed by Foley catheter. Other: None. IMPRESSION: No acute or significant findings.  No hydronephrosis. Electronically Signed   By: Franki Cabot  M.D.   On: 03/09/2021 18:35   DG CHEST PORT 1 VIEW  Result Date: 03/09/2021 CLINICAL DATA:  Increasing shortness of breath, wheezing, known rectal cancer EXAM: PORTABLE CHEST 1 VIEW COMPARISON:  03/07/2021 FINDINGS: Single frontal view of the chest demonstrates stable right chest wall port. Cardiac silhouette is stable. No change in bilateral veiling opacities consistent with consolidation and effusions. No pneumothorax. IMPRESSION: 1. Stable bibasilar consolidation and effusions. Electronically Signed   By: Randa Ngo M.D.   On: 03/09/2021 17:36   DG Abd Portable 1V  Result Date: 03/08/2021 CLINICAL DATA:  Ileus. EXAM: PORTABLE ABDOMEN - 1 VIEW COMPARISON:  None. FINDINGS: There is a paucity of bowel gas limiting evaluation. A surgical drain terminates in the pelvis. Bilateral pleural effusions with underlying atelectasis. No other abnormalities. IMPRESSION: 1. There is a surgical drain terminating in the pelvis. 2. The lack of significant bowel gas limits evaluation for ileus or obstruction. No dilated loops are seen. 3. Small bilateral pleural effusions with underlying atelectasis. Electronically Signed  By: Dorise Bullion III M.D   On: 03/08/2021 14:55   VAS [place] LOWER EXTREMITY VENOUS (DVT)  Result Date: 03/09/2021  Lower Venous DVT Study Patient Name:  JATORIAN RENAULT  Date of Exam:   03/09/2021 Medical Rec #: [id number]      Accession #:    [id number] Date of Birth: 1951/02/24     Patient Gender: M Patient Age:   69Y Exam Location:  [facility] Procedure:      VAS [place] LOWER EXTREMITY VENOUS (DVT) Referring Phys: [id number] Jonnie Finner --------------------------------------------------------------------------------  Indications: Edema.  Comparison Study: no prior Performing Technologist: Abram Sander RVS  Examination Guidelines: A complete evaluation includes B-mode imaging, spectral Doppler, color Doppler, and power Doppler as needed of all accessible portions of each vessel. Bilateral  testing is considered an integral part of a complete examination. Limited examinations for reoccurring indications may be performed as noted. The reflux portion of the exam is performed with the patient in reverse Trendelenburg.  +---------+---------------+---------+-----------+----------+--------------+ RIGHT    CompressibilityPhasicitySpontaneityPropertiesThrombus Aging +---------+---------------+---------+-----------+----------+--------------+ CFV      Full           Yes      Yes                                 +---------+---------------+---------+-----------+----------+--------------+ SFJ      Full                                                        +---------+---------------+---------+-----------+----------+--------------+ FV Prox  Full                                                        +---------+---------------+---------+-----------+----------+--------------+ FV Mid   Full                                                        +---------+---------------+---------+-----------+----------+--------------+ FV DistalFull                                                        +---------+---------------+---------+-----------+----------+--------------+ PFV      Full                                                        +---------+---------------+---------+-----------+----------+--------------+ POP      Full           Yes      Yes                                 +---------+---------------+---------+-----------+----------+--------------+ PTV  Full                                                        +---------+---------------+---------+-----------+----------+--------------+ PERO     Full                                                        +---------+---------------+---------+-----------+----------+--------------+   +---------+---------------+---------+-----------+----------+--------------+ LEFT      CompressibilityPhasicitySpontaneityPropertiesThrombus Aging +---------+---------------+---------+-----------+----------+--------------+ CFV      Full           Yes      Yes                                 +---------+---------------+---------+-----------+----------+--------------+ SFJ      Full                                                        +---------+---------------+---------+-----------+----------+--------------+ FV Prox  Full                                                        +---------+---------------+---------+-----------+----------+--------------+ FV Mid   Full                                                        +---------+---------------+---------+-----------+----------+--------------+ FV DistalFull                                                        +---------+---------------+---------+-----------+----------+--------------+ PFV      Full                                                        +---------+---------------+---------+-----------+----------+--------------+ POP      Full           Yes      Yes                                 +---------+---------------+---------+-----------+----------+--------------+ PTV      Full                                                        +---------+---------------+---------+-----------+----------+--------------+  PERO     Full                                                        +---------+---------------+---------+-----------+----------+--------------+     Summary: BILATERAL: - No evidence of deep vein thrombosis seen in the lower extremities, bilaterally. - No evidence of superficial venous thrombosis in the lower extremities, bilaterally. -No evidence of popliteal cyst, bilaterally.   *See table(s) above for measurements and observations. Electronically signed by Harold Barban MD on 03/09/2021 at 4:26:21 PM.    Final     Medications / Allergies: per  chart  Antibiotics: Anti-infectives (From admission, onward)   Start     Dose/Rate Route Frequency Ordered Stop   03/08/21 0030  piperacillin-tazobactam (ZOSYN) IVPB 3.375 g        3.375 g 12.5 mL/hr over 240 Minutes Intravenous Every 8 hours 03/08/21 0016 03/13/21 0029   03/05/21 2200  cefoTEtan (CEFOTAN) 2 g in sodium chloride 0.9 % 100 mL IVPB        2 g 200 mL/hr over 30 Minutes Intravenous Every 12 hours 03/05/21 1841 03/05/21 2258   03/05/21 0730  cefoTEtan (CEFOTAN) 2 g in sodium chloride 0.9 % 100 mL IVPB        2 g 200 mL/hr over 30 Minutes Intravenous On call to O.R. 03/05/21 [id number] 03/05/21 0926        Note: Portions of this report may have been transcribed using voice recognition software. Every effort was made to ensure accuracy; however, inadvertent computerized transcription errors may be present.   Any transcriptional errors that result from this process are unintentional.    Adin Hector, MD, FACS, MASCRS  Esophageal, Gastrointestinal & Colorectal Surgery Robotic and Minimally Invasive Surgery [facility] [address], [place], [place] [address] [phone] Fax [phone] Main/Paging  CONTACT INFORMATION: Weekday (9AM-5PM) concerns: Call [facility] main office at [phone] Weeknight (5PM-9AM) or Weekend/Holiday concerns: Check [email or web address] for General Surgery [facility] coverage (Please, do not use SecureChat as it is not reliable communication to operating surgeons for immediate patient care)      03/10/2021  6:40 AM

## 2021-03-10 NOTE — Consult Note (Signed)
[facility] Nurse ostomy follow up Stoma type/location: LUQ colosotmy Stomal assessment/size: 1 and 3/4 inch, slightly raised, 85% red, 15% slough. Lumen in center Peristomal assessment: intact Treatment options for stomal/peristomal skin: skin barrier ring Output: brown liquid stool Ostomy pouching: 1pc.soft (compressible) convex with skin barrier ring.  Education provided: Daughter Janett Billow) who is a CNA is in the room and relays that she observed last Thursday's pouch change.  She has no questions.  I offer education on the features of the soft (compressible) convex product such as the integrated gas filter and the fact that the pouch is both odor and water-proof (meaning that she father will be able to shower). She indicates relief at this information. Of course, I note that today's goals are to stabilize her father's fever and that ostomy education will continue as recovery progresses. Enrolled patient in [facility] Start Discharge program: Yes  Supplies in room (3 pouches, 3 rings, pattern, sizing guide) in plastic bag on window sill.   Thanks, Maudie Flakes, MSN, RN, [place], Arther Abbott  Pager# [phone]

## 2021-03-10 NOTE — Progress Notes (Signed)
PHARMACY - TOTAL PARENTERAL NUTRITION CONSULT NOTE   Indication: Prolonged ileus  Patient Measurements: Weight: 76.2 kg (167 lb 15.9 oz)   Body mass index is 24.1 kg/m.   Assessment: 70 yo male with rectal cancer s/p xi robotic assisted lower anterior resection with end colostomy on 4/27 now with prolonged ileus to start TPN on 5/2  Glucose / Insulin: CBGs66-104 prior to start of TPN Electrolytes: K 3.2 - replacement orders already done Renal: SCr 2.1 improved from 2.93, BUN 61 elevated Hepatic: AST/ALT 35/30 stable, tbili 0.3, alkphos 41 Albumin/prealbumin: alb 2.7, prealb 5 Intake / Output; MIVF: on Sodium Bicarb in D5W at 50 ml/hr GI Imaging: GI Surgeries / Procedures:  4/27:  xi robotic assisted lower anterior resection with end colostomy on 4/27  Central access: 5/2 TPN start date: 5/2  Nutritional Goals (per RD recommendation on ): pending  Goal TPN rate pending RD assessment is 90 mL/hr (provides 114 g of protein and 2199 kcals per day)  Current Nutrition:  NPO  Plan:  Start TPN at 20mL/hr at 1800 Electrolytes in TPN: Na 74mEq/L, K 62mEq/L, Ca 31mEq/L, Mg 56mEq/L, and Phos 6mmol/L. Cl:Ac 1:1 Add standard MVI and trace elements to TPN Initiate Sensitive q4h SSI and adjust as needed  Keep MIVF at 50 mL/hr for now per Md Monitor TPN labs on Mon/Thurs and as needed   Adrian Saran, PharmD, BCPS Secure Chat if ?s 03/10/2021 10:22 AM

## 2021-03-11 ENCOUNTER — Inpatient Hospital Stay (HOSPITAL_COMMUNITY): Payer: Medicare Other

## 2021-03-11 DIAGNOSIS — I959 Hypotension, unspecified: Secondary | ICD-10-CM

## 2021-03-11 DIAGNOSIS — Z4659 Encounter for fitting and adjustment of other gastrointestinal appliance and device: Secondary | ICD-10-CM

## 2021-03-11 DIAGNOSIS — N179 Acute kidney failure, unspecified: Secondary | ICD-10-CM | POA: Diagnosis not present

## 2021-03-11 DIAGNOSIS — E440 Moderate protein-calorie malnutrition: Secondary | ICD-10-CM | POA: Insufficient documentation

## 2021-03-11 DIAGNOSIS — C20 Malignant neoplasm of rectum: Secondary | ICD-10-CM

## 2021-03-11 DIAGNOSIS — K567 Ileus, unspecified: Secondary | ICD-10-CM | POA: Diagnosis not present

## 2021-03-11 DIAGNOSIS — D649 Anemia, unspecified: Secondary | ICD-10-CM

## 2021-03-11 HISTORY — PX: IR US GUIDE BX ASP/DRAIN: IMG2392

## 2021-03-11 LAB — PHOSPHORUS: Phosphorus: 1.7 mg/dL — ABNORMAL LOW (ref 2.5–4.6)

## 2021-03-11 LAB — COMPREHENSIVE METABOLIC PANEL
ALT: 22 U/L (ref 0–44)
AST: 30 U/L (ref 15–41)
Albumin: 2.2 g/dL — ABNORMAL LOW (ref 3.5–5.0)
Alkaline Phosphatase: 46 U/L (ref 38–126)
Anion gap: 9 (ref 5–15)
BUN: 58 mg/dL — ABNORMAL HIGH (ref 8–23)
CO2: 27 mmol/L (ref 22–32)
Calcium: 8.1 mg/dL — ABNORMAL LOW (ref 8.9–10.3)
Chloride: 111 mmol/L (ref 98–111)
Creatinine, Ser: 1.48 mg/dL — ABNORMAL HIGH (ref 0.61–1.24)
GFR, Estimated: 50 mL/min — ABNORMAL LOW (ref >60)
Glucose, Bld: 140 mg/dL — ABNORMAL HIGH (ref 70–99)
Potassium: 3.2 mmol/L — ABNORMAL LOW (ref 3.5–5.1)
Sodium: 147 mmol/L — ABNORMAL HIGH (ref 135–145)
Total Bilirubin: 1.2 mg/dL (ref 0.3–1.2)
Total Protein: 5.1 g/dL — ABNORMAL LOW (ref 6.5–8.1)

## 2021-03-11 LAB — CBC
HCT: 15.1 % — ABNORMAL LOW (ref 39.0–52.0)
Hemoglobin: 5.4 g/dL — CL (ref 13.0–17.0)
MCH: 29.7 pg (ref 26.0–34.0)
MCHC: 35.8 g/dL (ref 30.0–36.0)
MCV: 83 fL (ref 80.0–100.0)
Platelets: 136 10*3/uL — ABNORMAL LOW (ref 150–400)
RBC: 1.82 MIL/uL — ABNORMAL LOW (ref 4.22–5.81)
RDW: 14.7 % (ref 11.5–15.5)
WBC: 11.4 10*3/uL — ABNORMAL HIGH (ref 4.0–10.5)
nRBC: 2.6 % — ABNORMAL HIGH (ref 0.0–0.2)

## 2021-03-11 LAB — BLOOD GAS, ARTERIAL
Acid-Base Excess: 6.6 mmol/L — ABNORMAL HIGH (ref 0.0–2.0)
Bicarbonate: 29 mmol/L — ABNORMAL HIGH (ref 20.0–28.0)
O2 Saturation: 88.4 %
Patient temperature: 97.7
pCO2 arterial: 32 mmHg (ref 32.0–48.0)
pH, Arterial: 7.563 — ABNORMAL HIGH (ref 7.350–7.450)
pO2, Arterial: 52.8 mmHg — ABNORMAL LOW (ref 83.0–108.0)

## 2021-03-11 LAB — DIFFERENTIAL
Abs Immature Granulocytes: 0.37 10*3/uL — ABNORMAL HIGH (ref 0.00–0.07)
Basophils Absolute: 0 10*3/uL (ref 0.0–0.1)
Basophils Relative: 0 %
Eosinophils Absolute: 0 10*3/uL (ref 0.0–0.5)
Eosinophils Relative: 0 %
Immature Granulocytes: 3 %
Lymphocytes Relative: 7 %
Lymphs Abs: 0.8 10*3/uL (ref 0.7–4.0)
Monocytes Absolute: 0.9 10*3/uL (ref 0.1–1.0)
Monocytes Relative: 8 %
Neutro Abs: 9.3 10*3/uL — ABNORMAL HIGH (ref 1.7–7.7)
Neutrophils Relative %: 82 %

## 2021-03-11 LAB — HEMOGLOBIN AND HEMATOCRIT, BLOOD
HCT: 20.7 % — ABNORMAL LOW (ref 39.0–52.0)
HCT: 20.3 % — ABNORMAL LOW (ref 39.0–52.0)
Hemoglobin: 7.5 g/dL — ABNORMAL LOW (ref 13.0–17.0)
Hemoglobin: 7.3 g/dL — ABNORMAL LOW (ref 13.0–17.0)

## 2021-03-11 LAB — URINE CULTURE: Culture: NO GROWTH

## 2021-03-11 LAB — POTASSIUM: Potassium: 5 mmol/L (ref 3.5–5.1)

## 2021-03-11 LAB — OCCULT BLOOD X 1 CARD TO LAB, STOOL: Fecal Occult Bld: POSITIVE — AB

## 2021-03-11 LAB — TRIGLYCERIDES: Triglycerides: 121 mg/dL (ref <150)

## 2021-03-11 LAB — PREPARE RBC (CROSSMATCH)

## 2021-03-11 LAB — PREALBUMIN: Prealbumin: <5 mg/dL — ABNORMAL LOW (ref 18–38)

## 2021-03-11 LAB — GLUCOSE, CAPILLARY
Glucose-Capillary: 118 mg/dL — ABNORMAL HIGH (ref 70–99)
Glucose-Capillary: 123 mg/dL — ABNORMAL HIGH (ref 70–99)
Glucose-Capillary: 130 mg/dL — ABNORMAL HIGH (ref 70–99)
Glucose-Capillary: 130 mg/dL — ABNORMAL HIGH (ref 70–99)
Glucose-Capillary: 131 mg/dL — ABNORMAL HIGH (ref 70–99)
Glucose-Capillary: 136 mg/dL — ABNORMAL HIGH (ref 70–99)

## 2021-03-11 LAB — LACTIC ACID, PLASMA
Lactic Acid, Venous: 2.3 mmol/L (ref 0.5–1.9)
Lactic Acid, Venous: 2.9 mmol/L (ref 0.5–1.9)

## 2021-03-11 LAB — MAGNESIUM: Magnesium: 2.2 mg/dL (ref 1.7–2.4)

## 2021-03-11 MED ORDER — NOREPINEPHRINE 4 MG/250ML-% IV SOLN
0.0000 ug/min | INTRAVENOUS | Status: DC
  Administered 2021-03-11: 3 ug/min via INTRAVENOUS
  Administered 2021-03-11 – 2021-03-12 (×3): 2 ug/min via INTRAVENOUS
  Filled 2021-03-11 (×2): qty 250

## 2021-03-11 MED ORDER — ORAL CARE MOUTH RINSE
15.0000 mL | Freq: Two times a day (BID) | OROMUCOSAL | Status: DC
  Administered 2021-03-12 – 2021-04-11 (×44): 15 mL via OROMUCOSAL

## 2021-03-11 MED ORDER — TRAVASOL 10 % IV SOLN
INTRAVENOUS | Status: AC
  Filled 2021-03-11: qty 811.2

## 2021-03-11 MED ORDER — SODIUM CHLORIDE 0.9 % IV BOLUS
500.0000 mL | Freq: Once | INTRAVENOUS | Status: AC
  Administered 2021-03-11: 500 mL via INTRAVENOUS

## 2021-03-11 MED ORDER — LACTATED RINGERS IV SOLN: INTRAVENOUS | Status: DC

## 2021-03-11 MED ORDER — SODIUM CHLORIDE (PF) 0.9 % IJ SOLN
INTRAMUSCULAR | Status: AC
  Filled 2021-03-11: qty 50

## 2021-03-11 MED ORDER — POTASSIUM CHLORIDE 10 MEQ/50ML IV SOLN
10.0000 meq | INTRAVENOUS | Status: DC
  Filled 2021-03-11: qty 50

## 2021-03-11 MED ORDER — POTASSIUM CHLORIDE 10 MEQ/50ML IV SOLN
10.0000 meq | INTRAVENOUS | Status: AC
  Administered 2021-03-11 (×2): 10 meq via INTRAVENOUS
  Filled 2021-03-11: qty 50

## 2021-03-11 MED ORDER — POTASSIUM PHOSPHATES 15 MMOLE/5ML IV SOLN
30.0000 mmol | Freq: Once | INTRAVENOUS | Status: AC
  Administered 2021-03-11: 30 mmol via INTRAVENOUS
  Filled 2021-03-11: qty 10

## 2021-03-11 MED ORDER — LIDOCAINE-EPINEPHRINE 1 %-1:100000 IJ SOLN
INTRAMUSCULAR | Status: AC
  Filled 2021-03-11: qty 1

## 2021-03-11 MED ORDER — SODIUM CHLORIDE 0.9 % IV SOLN: INTRAVENOUS | Status: AC

## 2021-03-11 MED ORDER — SODIUM CHLORIDE 0.9 % IV BOLUS: 1000.0000 mL | Freq: Three times a day (TID) | INTRAVENOUS | Status: AC | PRN

## 2021-03-11 MED ORDER — IOHEXOL 300 MG/ML  SOLN
100.0000 mL | Freq: Once | INTRAMUSCULAR | Status: AC | PRN
  Administered 2021-03-11: 100 mL via INTRAVENOUS

## 2021-03-11 MED ORDER — ENOXAPARIN SODIUM 40 MG/0.4ML IJ SOSY: 40.0000 mg | PREFILLED_SYRINGE | INTRAMUSCULAR | Status: DC

## 2021-03-11 MED ORDER — FUROSEMIDE 10 MG/ML IJ SOLN
40.0000 mg | Freq: Once | INTRAMUSCULAR | Status: AC
  Administered 2021-03-12: 40 mg via INTRAVENOUS
  Filled 2021-03-11: qty 4

## 2021-03-11 MED ORDER — SODIUM CHLORIDE 0.9 % IV SOLN: 100.0000 mg | INTRAVENOUS | Status: DC

## 2021-03-11 MED ORDER — CHLORHEXIDINE GLUCONATE 0.12 % MT SOLN
15.0000 mL | Freq: Two times a day (BID) | OROMUCOSAL | Status: DC
  Administered 2021-03-11 – 2021-04-11 (×60): 15 mL via OROMUCOSAL
  Filled 2021-03-11 (×56): qty 15

## 2021-03-11 MED ORDER — SODIUM CHLORIDE 0.9 % IV SOLN
100.0000 mg | INTRAVENOUS | Status: AC
  Administered 2021-03-12 – 2021-03-16 (×5): 100 mg via INTRAVENOUS
  Filled 2021-03-11 (×5): qty 100

## 2021-03-11 MED ORDER — IOHEXOL 9 MG/ML PO SOLN: 500.0000 mL | ORAL | Status: DC

## 2021-03-11 MED ORDER — ENOXAPARIN SODIUM 40 MG/0.4ML IJ SOSY
40.0000 mg | PREFILLED_SYRINGE | INTRAMUSCULAR | Status: DC
  Administered 2021-03-13: 40 mg via SUBCUTANEOUS
  Filled 2021-03-11: qty 0.4

## 2021-03-11 MED ORDER — ACETAMINOPHEN 10 MG/ML IV SOLN
1000.0000 mg | Freq: Four times a day (QID) | INTRAVENOUS | Status: AC | PRN
  Administered 2021-03-11 – 2021-03-12 (×3): 1000 mg via INTRAVENOUS
  Filled 2021-03-11 (×3): qty 100

## 2021-03-11 MED ORDER — SODIUM CHLORIDE 0.9 % IV SOLN
200.0000 mg | Freq: Once | INTRAVENOUS | Status: AC
  Administered 2021-03-11: 200 mg via INTRAVENOUS
  Filled 2021-03-11: qty 200

## 2021-03-11 MED ORDER — PANTOPRAZOLE SODIUM 40 MG IV SOLR
40.0000 mg | Freq: Two times a day (BID) | INTRAVENOUS | Status: DC
  Administered 2021-03-11 – 2021-03-13 (×5): 40 mg via INTRAVENOUS
  Filled 2021-03-11 (×4): qty 40

## 2021-03-11 MED ORDER — SODIUM CHLORIDE 0.9% IV SOLUTION: Freq: Once | INTRAVENOUS | Status: AC

## 2021-03-11 MED ORDER — IOHEXOL 9 MG/ML PO SOLN
ORAL | Status: AC
  Administered 2021-03-11: 500 mL via ORAL
  Filled 2021-03-11: qty 1000

## 2021-03-11 MED ORDER — SODIUM CHLORIDE 0.9 % IV SOLN: INTRAVENOUS | Status: DC

## 2021-03-11 MED ORDER — LIDOCAINE-EPINEPHRINE (PF) 1 %-1:200000 IJ SOLN
INTRAMUSCULAR | Status: AC | PRN
  Administered 2021-03-11: 5 mL

## 2021-03-11 MED ORDER — ACETAMINOPHEN 325 MG PO TABS: 650.0000 mg | ORAL_TABLET | Freq: Once | ORAL | Status: DC

## 2021-03-11 NOTE — Progress Notes (Signed)
Patient continues to be hypotensive, tachycardic, tachypneic, and overall continues to appears weak. Expressed additional concern to MD J. Mansy. See orders for details. Will continue to monitor patient.

## 2021-03-11 NOTE — Progress Notes (Addendum)
Patient has received 2 units of blood.  Discussed with ICU nursing.   Weaning down the Levophed somewhat. Patient now has had a lot of liquid stool out the pink functioning colostomy   CT scan of abdomen pelvis done.  No IV contrast given recent renal failure.  Patient able to tolerate half of enteral contrast.  Reviewed films with Dr. Anselm Pancoast with interventional radiology.  Edematous thickened bowel especially colon.  Most likely reflection of edema.  Ischemia another possibility but end colostomy pink and viable argues against that.  No evidence of free air perforation.  No evidence of bowel obstruction.  Low pelvic fluid collection near surgical drain adequate drainage -it could reflect some rectal stump dehiscence but he can drain transanally and has a surgical drain to catch any disruption of the rectum into the peritoneal cavity.  Small drained fluid collection there.  Left upper quadrant infra-splenic and left flank fluid.  Could be ascites or hematoma but in the setting of shock, I feel the patient would benefit from percutaneous drainage to r/o abscess.  Dr. Anselm Pancoast agrees.  He will see if bedside ultrasound aspiration can be done now versus CT-guided aspiration/drainage placement in the morning.  NGT coiled up into esophagus - need to pull back 15 cm.    Continue IV antibiotics.  Continue IV BID PPI - switch to gtt if recurrent UGIB & consider GI eval - OK for now  Follow-up hemoglobin this afternoon.  If hemoglobin less than 7, would recommend transfusing 2 units more of blood.  Hopefully they can get him off the pressors.  If blood pressure improves, consider 1 dose of Lasix to help with diuresis since patient with anasarca and total body fluid overload.  Again I would continue to be aggressive with this patient  Hiis renal failure is resolving, despite concerns of productive mucus and tachypnea, he is not progressed into worsening oxygen requirement/respiratory failure.  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  His pathology has negative margins with negative lymph nodes which is a downgrade and offers decent long-term survival.  I remain hopeful that we can stabilize and improvement with continued aggressive care .  D/w ICU nursing & TRH   Adin Hector, MD, FACS, MASCRS  Esophageal, Gastrointestinal & Colorectal Surgery Robotic and Minimally Invasive Surgery [facility] [address] [place], [place] [address] [phone] Fax [phone] Main/Paging  CONTACT INFORMATION: Weekday (9AM-5PM) concerns: Call [facility] main office at [phone] Weeknight (5PM-9AM) or Weekend/Holiday concerns: Check [email or web address] for General Surgery [facility] coverage (Please, do not use SecureChat as it is not reliable communication to operating surgeons for immediate patient care)

## 2021-03-11 NOTE — Progress Notes (Signed)
[name], male   DOB: 09-25-1951, 70 y.o.   MRN: [id number] Pt's latest CT reviewed by Dr. Pascal Lux; per order of Dr. Johney Maine plans are for US guided aspiration/possible drainage of LUQ fluid collection at pt's bedside this afternoon. Plans d/w pt/spouse with their understanding and consent. Pt remains on levophed and recently received fentanyl.

## 2021-03-11 NOTE — Progress Notes (Signed)
Pathology notes rectal cancer.  Margins negative.  Distal margin grossly at 4 cm.  0/26  Lymph nodes negative.  Consistent with ypT4ypN0. Downgrades to a high risk stage II.  Need to discuss at GI tumor board if patient would benefit from post adjuvant chemotherapy.  Patient certainly not ready to consider that given his challenging course.  Try and set up to discuss at GI tumor board next week    SURGICAL PATHOLOGY  * THIS IS AN ADDENDUM REPORT *  CASE: [id number]  PATIENT: Shane [name]  Surgical Pathology Report  *Addendum *   Reason for Addendum #1: Immunohistochemistry results  Reason for Addendum #2: DNA Mismatch Repair IHC Results   Clinical History: Rectal cancer ([facility])    FINAL MICROSCOPIC DIAGNOSIS:   A. COLON, RECTOSIGMOID, RESECTION:  - Residual invasive adenocarcinoma status post neoadjuvant therapy  - No carcinoma identified in eighteen lymph nodes (0/18)  - Margins uninvolved by carcinoma  - Diverticular disease  - See oncology table and comment below   B. COLON, ASCENDING, RESECTION:  - No residual adenocarcinoma  - No carcinoma identified in eight lymph nodes (0/8)   ONCOLOGY TABLE:   COLON AND RECTUM, CARCINOMA: Resection, Including Transanal Disk  Excision of Rectal Neoplasms   Procedure: Low anterior resection  Tumor Site: Rectum  Tumor Size: Adenocarcinoma  Macroscopic Tumor Perforation: Not identified  Macroscopic Evaluation of Mesorectum (required for rectal cancer):  Incomplete  Histologic Type: Adenocarcinoma  Histologic Grade: G2, moderately differentiated  Multiple Primary Sites: N/A  Tumor Extension: Directly invades or adheres to adjacent structure  (sigmoid colon)  Lymphovascular Invasion: Not identified  Perineural Invasion: Not identified  Treatment Effect: Present, with residual cancer showing evident tumor  regression, but more than single cells or rare small groups of cancer  cells (partial response, score 2)   Margins:    Margin Status for Invasive Carcinoma: All margins negative for  invasive carcinoma    Distance from Invasive Carcinoma to Radial (Circumferential) Margin  (required for rectal       tumors): Cannot be determined  Margin Status for Non-Invasive Tumor: All margins negative for  high-grade dysplasia / intramucosal       carcinoma and low-grade dysplasia  Regional Lymph Nodes:    Number of Lymph Nodes with Tumor: 0    Number of Lymph Nodes Examined: 26  Tumor Deposits: Not identified  Distant Metastasis:    Distant Site(s) Involved: Not applicable  Pathologic Stage Classification (pTNM, AJCC 8th Edition): ypT4b, ypN0  Ancillary Studies: MMR / MSI testing will be ordered.  Representative Tumor Block: A9  Comments: Dr. Saralyn Pilar reviewed the case and agrees with the above  diagnosis.  (v4.2.0.1)    Shane [name] DESCRIPTION:   Specimen A: Rectosigmoid, double suture is distal margin, received  fresh.  Specimen integrity: Disrupted around distal margin  Specimen length: 48 cm in length tortuous segment, with mid to distal  rectum looped back and adherent upon distal sigmoid/proximal rectum.  Mesorectal intactness: Disrupted and incomplete, diffusely indurated,  with minimal mesorectum.  Tumor location: Distal rectum  Tumor size: Within the distal rectum is a 1.5 cm in diameter area of  roughened, flattened and centrally umbilicated tan-red mucosa. A  discrete mucosal mass is not identified. Within the distal  sigmoid/proximal rectum is a 2 x 2 cm area of tan-red indurated,  roughened mucosa. The lumen in this area is stenosed to less than 1 cm.  On sectioning through the 2 areas of roughened mucosa, there is  intervening  pink-white to red indurated and dense tissue, corresponding  to the adherent loop of intestine. There is also diffuse indurated  tissue surrounding the distal rectum, however a discrete mass lesion is  not identified.  Percent of  bowel circumference involved: Not applicable  Tumor distance to margins:            Proximal: 22 cm from the area of roughened mucosa  in distal sigmoid/proximal rectum.            Distal: 4 cm from area of roughened mucosa in  distal rectum.            Radial (entire lower 1/3 rectum): Indurated, dense  tissue between 2 areas of roughened mucosa diffusely abuts perirectal  tissue.  Macroscopic extent of tumor invasion: Indeterminate  Total presumed lymph nodes: There are 18 possible lymph nodes on initial  search, ranging from 0.3 to0.8 cm.  Extramural satellite tumor nodules: None  Mucosal polyp(s): None  Additional findings: Several unperforated diverticula within sigmoid.  Block summary:  Block 1 = proximal margin  Block 2 = distal margin  Blocks 3-7 = sections of 2 areas of roughened mucosa, with intervening  dense tissue  Blocks 8, 9 = additional sections around roughened mucosa, distal  sigmoid/proximal rectum  Blocks 10, 11 = indurated perirectal soft tissue  Block 12 = diverticulum  Block 13 = 5 possible nodes  Block 14 = 5 possible nodes  Block 15 = 4 possible nodes  Block 16 = 4 possible nodes   Specimen B: Colon resection, descending, staple line is final proximal  margin, received fresh.  Length: 17 cm  Serosa: Scattered fatty adhesions  Contents: Filled with green-brown soft material.  Mucosa/Wall: Tan-pink, smooth, soft, with normal intestinal folds.  There are no mass lesions. The wall averages 0.5 cm thick, with 2 focal  areas of possible intramural hemorrhage.  Lymph nodes: Found within attached fat are 8 possible lymph nodes which  range from 0.1-0.4 cm.  Block Summary:  Block 1 = proximal margin  Block 2 = distal margin  Block 3 = areas of possible intramural hemorrhage  Block 4 = 4 possible nodes  Block 5 = 4 possible nodes   SW 03/06/2021     Final Diagnosis performed by Thressa Sheller, MD.  Electronically signed   03/10/2021  Technical component performed at [facility], [place]  [address], [place], [place] [address].  Professional component performed at [employer]. [facility],  [place] [address], [place], [place] [address].  Immunohistochemistry Technical component (if applicable) was performed  at [facility]. [address] [place],  [place], [place] [address].  IMMUNOHISTOCHEMISTRY DISCLAIMER (if applicable):  Some of these immunohistochemical stains may have been developed and the  performance characteristics determine by [facility]. Some  may not have been cleared or approved by the U.S. Food and Drug  Administration. The FDA has determined that such clearance or approval  is not necessary. This test is used for clinical purposes. It should not  be regarded as investigational or for research. This laboratory is  certified under the [place]  (CLIA-88) as qualified to perform high complexity clinical laboratory  testing. The controls stained appropriately.   ADDENDUM:   CDX-2 immunohistochemistry does NOT highlight the presence of  adenocarcinoma at the inked perirectal soft tissue.           Addendum #1 performed by Thressa Sheller, MD.  Electronically signed  03/11/2021  Technical component performed at [facility], [place]  [address] [place], [place] [address].  Professional component performed at [employer]. [facility],  [place] [address] [place], [place] [address].  Immunohistochemistry Technical component (if applicable) was performed  at [facility]. [address], [place],  [place], [place] [address].  IMMUNOHISTOCHEMISTRY DISCLAIMER (if applicable):  Some of these immunohistochemical stains may have been developed and the  performance characteristics determine by [facility]. Some  may not have been cleared or approved  by the U.S. Food and Drug  Administration. The FDA has determined that such clearance or approval  is not necessary. This test is used for clinical purposes. It should not  be regarded as investigational or for research. This laboratory is  certified under the [place]  (CLIA-88) as qualified to perform high complexity clinical laboratory  testing. The controls stained appropriately.   ADDENDUM:   Mismatch Repair Protein (IHC)   SUMMARY INTERPRETATION: NORMAL  There is preserved expression of the major MMR proteins. There is a very  low probability that microsatellite instability (MSI) is present.  However, certain clinically significant MMR protein mutations may result  in preservation of nuclear expression. It is recommended that the  preservation of protein expression be correlated with molecular based  MSI testing.   IHC EXPRESSION RESULTS  TEST      RESULT  MLH1:     Preserved nuclear expression  MSH2:     Preserved nuclear expression  MSH6:     Preserved nuclear expression  PMS2:     Preserved nuclear expression   References:  1. Guidelines on Genetic Evaluation and Management of Lynch Syndrome: A  Consensus Statement by the [place] Multi-Society Task Force on Colorectal  Cancer Gae Dry. Sherlie Ban, MD, and others. Am [place]i [place] 2014;  [phone]; doi: 10.1038/ajg.2014.186; published online 30 May 2013  2. Outcomes of screening endometrial cancer patients for Lynch syndrome  by patient-administered checklist. Olena Heckle MS, and others. Gynecol Oncol  2013;131(3):619-623.  3. Muir-Torre syndrome (MTS): An update and approach to diagnosis and  management. Shelly Flatten, MD and others. J Am Acad Dermatol  [phone]   Disclaimer  Some of these immunohistochemical stains may have been developed and the  performance characteristics determined by [facility]. Some may  not have been cleared or  approved by the U.S. Food and Drug  Administration. The FDA has determined that such clearance or approval  is not necessary. This test is used for clinical purposes. It should not  be regarded as investigational or for research. This laboratory is  certified under the [place]  (CLIA-88) as qualified to perform high complexity clinical laboratory  testing.  Testing performed: [place], [place],  [place], [place] [address]             Addendum #2 performed by Gillie Manners, MD.  Electronically signed  03/11/2021  Technical component performed at [facility], [place]  [address] [place], [place] [address].  Professional component performed at [employer]. [facility],  [place] [address], [place], [place] [address].  Immunohistochemistry Technical component (if applicable) was performed  at [facility]. [address], [place],  [place],  [address].  IMMUNOHISTOCHEMISTRY DISCLAIMER (if applicable):  Some of these immunohistochemical stains may have been developed and the  performance characteristics determine by [facility]. Some  may not  have been cleared or approved by the U.S. Food and Drug  Administration. The FDA has determined that such clearance or approval  is not necessary. This test is used for clinical purposes. It should not  be regarded as investigational or for research. This laboratory is  certified under the [place]  (CLIA-88) as qualified to perform high complexity clinical laboratory  testing. The controls stained appropriately.

## 2021-03-11 NOTE — Progress Notes (Signed)
Upon receiving patient, family with lots of questions. On assessment patient very weak, alert to family, hypotensive, tachycardic, tachypneic, warm to touch. Family concerned about care and plan of care. Also concerned about "Moucous plug" and "Fluid in lungs." MD J. Mansy notified, made aware, and updated on patient. 250 ml NS bolus ordered on patient. Will administer and continue to monitor patient.

## 2021-03-11 NOTE — Progress Notes (Signed)
Hospitalist Consult Progress Note   Shane [name]  [id number]  DOB: 1950-11-16  DOA: 03/05/2021  PCP: Shane Salvage, FNP  Requesting provider: Dr. Johney Maine Reason for consultation: AKI, delirium, tachypnea   History of Present Illness: Shane [name] is a 70 y.o. male with medical history significant of rectal CA (possibly stage IV given pulm nodules) presented with rectal bleeding. Admitted to surgical team. Evaluation -- including imaging -- showed a large rectal mass. C-scope showed that this mass was in communication with an adjacent loop of sigmoid colon. Further imaging showed communication with right seminal vesicle and pelvic side wall. This prompted surgery to take him for resection of bowel w/ placement of a permanent colostomy. He successfully completed that procedure on 4/27.  Since that time he has developed ileus.  Over the past couple of days he has become more tachypnic, tachycardic and his creatinine had risen. He has also become more delirious. TRH was consulted to assist with these issues.   Interval history: Became hypotensive and even more lethargic overnight. Started on pressors and this am receiving PRBC (Hgb 5.4 g/dL).  Also CT abdomen/pelvis ordered as well.  This morning, he is not able to interact with me much.  Yesterday he could talk some with slurred speech but this morning appears even more weak, too weak to talk.   Review of Systems: Review of Systems - too weak to obtain full ROS   Assessment/Plan:  Rectal cancer - s/p radiation/Xeloda 01/03/21 - has persistent lung nodules seen on CT scans; concern would be mets? 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 UOP remains high (3L past 24 hours and 4.8L prior to that). Possibly the diuretic phase of ATN and will need to keep up with volume; especially since he became hypotensive overnight (from contribution of volume loss) - he is on LR maintenance IVF; agree with stopping bicarb (stopped overnight of 5/2 per PCCM), his CO2 is 27 - PRBC will also volume expan  Delirium - not unexpected given hospitalization and underlying frail state - QTc 411 on 03/06/21 EKG - use haldol only for agitation if needed -Limit sedating agents given his severe lethargy at this time - re-orient as needed otherwise supportive care  Tachypnea Fever Probable aspiration - likely due to compression atelectasis and effusions noted on CXR from 5/1; he's been laying in bed for ~6 days - encourage IS use - if able to get patient to a chair that may help as well - s/p lasix on 5/1, but improving nutrition status will help auto-diurese as protein improves - fever also likely atelectasis and/or aspiration given large NGT output, ileus, and probably poor cough/gag ability; would continue zosyn and complete an aspiration course -VQ scan canceled as PE suspicion very low plus extremely doubtful he could hold his breath anyways for the study nor is he a candidate for anticoagulation at this point with anemia requiring PRBCs   Past Medical History: Past Medical History:  Diagnosis Date  . Allergy   .  Arthritis    knee  . Rectal cancer (HCC) 08/2020    Past Surgical History: Past Surgical History:  Procedure Laterality Date  . IR IMAGING GUIDED PORT INSERTION  07/25/2020  . OSTOMY N/A 03/05/2021   Procedure: OSTOMY;  Surgeon: Romie Leveehomas, Alicia, MD;  Location: [facility];  Service: General;  Laterality: N/A;  . WISDOM TOOTH EXTRACTION    . XI ROBOTIC ASSISTED LOWER  ANTERIOR RESECTION N/A 03/05/2021   Procedure: XI ROBOTIC ASSISTED LOWER ANTERIOR RESECTION WITH MOBILIZATION OF THE SPLENIC FLEXURE OF THE COLON AND END COLOSTOMY;  Surgeon: Romie Leveehomas, Alicia, MD;  Location: [facility];  Service: General;  Laterality: N/A;  6 HOURS    Allergies:   Allergies  Allergen Reactions  . Nsaids Other (See Comments)    Kidney disease    Social History:  reports that he quit smoking about 42 years ago. His smoking use included cigarettes. He has a 10.00 pack-year smoking history. He has never used smokeless tobacco. He reports that he does not drink alcohol and does not use drugs.  Family History: Family History  Problem Relation Age of Onset  . Healthy Mother   . Hypertension Father   . Stroke Father   . Colon cancer Neg Hx   . Esophageal cancer Neg Hx   . Stomach cancer Neg Hx   . Rectal cancer Neg Hx    Objective: Physical Exam: Vitals:   03/11/21 0808 03/11/21 1001 03/11/21 1030 03/11/21 1056  BP: (!) 123/45 (!) 128/43 (!) 128/37 (!) 120/29  Pulse: (!) 111 (!) 111  (!) 108  Resp: (!) 27 (!) 30 (!) 33 (!) 32  Temp: 100.1 F (37.8 C) 99.7 F (37.6 C) 99.6 F (37.6 C) 98.3 F (36.8 C)  TempSrc:  Axillary Axillary   SpO2:  98% 97%   Weight:       General appearance: weak, frail, deconditioned; awakens to my voice but unable to talk back at all today due to severe lethargy Head: Normocephalic, without obvious abnormality, atraumatic, NGT in place Eyes: EOMI Lungs: mild coarse BS, no wheezing Heart: regular rate and rhythm and S1, S2 normal Abdomen: More distended; ostomy bag in place with brown stool. Absent bowel sounds. Nonsp TTP. RUQ drain noted Extremities: no edema Skin: mobility and turgor normal Neurologic: Unable to fully assess due to altered mentation and severe lethargy  Data reviewed:  I have personally reviewed following labs and imaging studies Results for orders placed or performed during the hospital encounter of 03/05/21 (from the  past 48 hour(s))  Glucose, capillary     Status: None   Collection Time: 03/09/21  3:31 PM  Result Value Ref Range   Glucose-Capillary 88 70 - 99 mg/dL    Comment: Glucose reference range applies only to samples taken after fasting for at least 8 hours.  Culture, blood (routine x 2)     Status: None (Preliminary result)   Collection Time: 03/09/21  6:24 PM   Specimen: BLOOD  Result Value Ref Range   Specimen Description      BLOOD BLOOD RIGHT HAND Performed at [facility], [address] [place], [place] [address]    Special Requests      BOTTLES DRAWN AEROBIC ONLY Blood Culture adequate volume Performed at [facility], [address] [place], [place] [address]    Culture      NO GROWTH < 12 HOURS Performed at [facility], [address] [place], [place] [address]  Report Status PENDING   Culture, blood (routine x 2)     Status: None (Preliminary result)   Collection Time: 03/09/21  6:24 PM   Specimen: BLOOD  Result Value Ref Range   Specimen Description      BLOOD RIGHT ANTECUBITAL Performed at [facility], [place] [address] [place], [place] [address]    Special Requests      BOTTLES DRAWN AEROBIC ONLY Blood Culture adequate volume Performed at [place] [address] [place], [place] [address]    Culture      NO GROWTH < 12 HOURS Performed at [place] [address] [place], [place] [address]    Report Status PENDING   Culture, Urine     Status: None   Collection Time: 03/09/21  6:36 PM   Specimen: Urine, Random  Result Value Ref Range   Specimen Description      URINE, RANDOM Performed at [place] [address] [place], [place] [address]    Special Requests NONE    Culture      NO GROWTH Performed at [facility], [place] [address], [place], [place] [address]    Report Status 03/11/2021 FINAL   Glucose, capillary     Status:  Abnormal   Collection Time: 03/09/21  7:44 PM  Result Value Ref Range   Glucose-Capillary 66 (L) 70 - 99 mg/dL    Comment: Glucose reference range applies only to samples taken after fasting for at least 8 hours.  Glucose, capillary     Status: None   Collection Time: 03/09/21  8:31 PM  Result Value Ref Range   Glucose-Capillary 98 70 - 99 mg/dL    Comment: Glucose reference range applies only to samples taken after fasting for at least 8 hours.  Glucose, capillary     Status: None   Collection Time: 03/10/21  1:25 AM  Result Value Ref Range   Glucose-Capillary 94 70 - 99 mg/dL    Comment: Glucose reference range applies only to samples taken after fasting for at least 8 hours.  CBC     Status: Abnormal   Collection Time: 03/10/21  2:33 AM  Result Value Ref Range   WBC 7.2 4.0 - 10.5 K/uL   RBC 2.64 (L) 4.22 - 5.81 MIL/uL   Hemoglobin 7.9 (L) 13.0 - 17.0 g/dL   HCT 21.5 (L) 39.0 - 52.0 %   MCV 81.4 80.0 - 100.0 fL   MCH 29.9 26.0 - 34.0 pg   MCHC 36.7 (H) 30.0 - 36.0 g/dL   RDW 14.3 11.5 - 15.5 %   Platelets 123 (L) 150 - 400 K/uL   nRBC 1.4 (H) 0.0 - 0.2 %    Comment: Performed at [facility], [place] [address] [place],  [address]  Basic metabolic panel     Status: Abnormal   Collection Time: 03/10/21  2:33 AM  Result Value Ref Range   Sodium 144 135 - 145 mmol/L   Potassium 3.2 (L) 3.5 - 5.1 mmol/L   Chloride 110 98 - 111 mmol/L   CO2 23 22 - 32 mmol/L   Glucose, Bld 105 (H) 70 - 99 mg/dL    Comment: Glucose reference range applies only to samples taken after fasting for at least 8 hours.   BUN 61 (H) 8 - 23 mg/dL   Creatinine, Ser 2.10 (H) 0.61 - 1.24 mg/dL   Calcium 8.6 (L) 8.9 - 10.3 mg/dL  GFR, Estimated 33 (L) >60 mL/min    Comment: (NOTE) Calculated using the CKD-EPI Creatinine Equation (2021)    Anion gap 11 5 - 15    Comment: Performed at [facility], [place] [address], [place], [place] [address]  Vitamin B12      Status: None   Collection Time: 03/10/21  2:33 AM  Result Value Ref Range   Vitamin B-12 888 180 - 914 pg/mL    Comment: (NOTE) This assay is not validated for testing neonatal or myeloproliferative syndrome specimens for Vitamin B12 levels. Performed at [facility], [place] [address], [place], [place] [address]   Folate     Status: None   Collection Time: 03/10/21  2:33 AM  Result Value Ref Range   Folate 7.9 >5.9 ng/mL    Comment: Performed at [facility], [place] [address], [place], [place] [address]  Iron and TIBC     Status: Abnormal   Collection Time: 03/10/21  2:33 AM  Result Value Ref Range   Iron 20 (L) 45 - 182 ug/dL   TIBC 205 (L) 250 - 450 ug/dL   Saturation Ratios 10 (L) 17.9 - 39.5 %   UIBC 185 ug/dL    Comment: Performed at [facility], [place] [address], [place], [place] [address]  Ferritin     Status: None   Collection Time: 03/10/21  2:33 AM  Result Value Ref Range   Ferritin 214 24 - 336 ng/mL    Comment: Performed at [facility], [place] [address] [place], [place] [address]  Reticulocytes     Status: Abnormal   Collection Time: 03/10/21  2:33 AM  Result Value Ref Range   Retic Ct Pct 1.0 0.4 - 3.1 %   RBC. 2.68 (L) 4.22 - 5.81 MIL/uL   Retic Count, Absolute 26.5 19.0 - 186.0 K/uL   Immature Retic Fract 12.1 2.3 - 15.9 %    Comment: Performed at [facility], [place] [address] [place], [place] [address]  Magnesium     Status: None   Collection Time: 03/10/21  2:33 AM  Result Value Ref Range   Magnesium 2.2 1.7 - 2.4 mg/dL    Comment: Performed at [facility], [place] [address], [place], [place] [address]  Glucose, capillary     Status: Abnormal   Collection Time: 03/10/21  3:16 AM  Result Value Ref Range   Glucose-Capillary 104 (H) 70 - 99 mg/dL    Comment: Glucose reference range applies only to samples taken after fasting for at least 8 hours.   Glucose, capillary     Status: Abnormal   Collection Time: 03/10/21  7:31 AM  Result Value Ref Range   Glucose-Capillary 102 (H) 70 - 99 mg/dL    Comment: Glucose reference range applies only to samples taken after fasting for at least 8 hours.  Glucose, capillary     Status: Abnormal   Collection Time: 03/10/21 12:14 PM  Result Value Ref Range   Glucose-Capillary 118 (H) 70 - 99 mg/dL    Comment: Glucose reference range applies only to samples taken after fasting for at least 8 hours.  Glucose, capillary     Status: None   Collection Time: 03/10/21  3:57 PM  Result Value Ref Range   Glucose-Capillary 97 70 - 99 mg/dL    Comment: Glucose reference range applies only to samples taken after fasting for at least 8 hours.  Glucose, capillary  Status: Abnormal   Collection Time: 03/10/21  7:30 PM  Result Value Ref Range   Glucose-Capillary 108 (H) 70 - 99 mg/dL    Comment: Glucose reference range applies only to samples taken after fasting for at least 8 hours.  Glucose, capillary     Status: Abnormal   Collection Time: 03/10/21 11:39 PM  Result Value Ref Range   Glucose-Capillary 130 (H) 70 - 99 mg/dL    Comment: Glucose reference range applies only to samples taken after fasting for at least 8 hours.  Comprehensive metabolic panel     Status: Abnormal   Collection Time: 03/11/21  2:31 AM  Result Value Ref Range   Sodium 147 (H) 135 - 145 mmol/L   Potassium 3.2 (L) 3.5 - 5.1 mmol/L   Chloride 111 98 - 111 mmol/L   CO2 27 22 - 32 mmol/L   Glucose, Bld 140 (H) 70 - 99 mg/dL    Comment: Glucose reference range applies only to samples taken after fasting for at least 8 hours.   BUN 58 (H) 8 - 23 mg/dL   Creatinine, Ser 1.48 (H) 0.61 - 1.24 mg/dL   Calcium 8.1 (L) 8.9 - 10.3 mg/dL   Total Protein 5.1 (L) 6.5 - 8.1 g/dL   Albumin 2.2 (L) 3.5 - 5.0 g/dL   AST 30 15 - 41 U/L   ALT 22 0 - 44 U/L   Alkaline Phosphatase 46 38 - 126 U/L   Total Bilirubin 1.2 0.3 - 1.2 mg/dL   GFR,  Estimated 50 (L) >60 mL/min    Comment: (NOTE) Calculated using the CKD-EPI Creatinine Equation (2021)    Anion gap 9 5 - 15    Comment: Performed at [facility], [place] [address] [place], [place] [address]  Prealbumin     Status: Abnormal   Collection Time: 03/11/21  2:31 AM  Result Value Ref Range   Prealbumin <5 (L) 18 - 38 mg/dL    Comment: Performed at [facility], [place] [address] [place], [place] [address]  Magnesium     Status: None   Collection Time: 03/11/21  2:31 AM  Result Value Ref Range   Magnesium 2.2 1.7 - 2.4 mg/dL    Comment: Performed at [facility], [place] [address] [place], [place] [address]  Phosphorus     Status: Abnormal   Collection Time: 03/11/21  2:31 AM  Result Value Ref Range   Phosphorus 1.7 (L) 2.5 - 4.6 mg/dL    Comment: Performed at [facility], [place] [address], [place], [place] [address]  Triglycerides     Status: None   Collection Time: 03/11/21  2:31 AM  Result Value Ref Range   Triglycerides 121 <150 mg/dL    Comment: Performed at [facility], [place] [address], [place], [place] [address]  CBC     Status: Abnormal   Collection Time: 03/11/21  2:31 AM  Result Value Ref Range   WBC 11.4 (H) 4.0 - 10.5 K/uL   RBC 1.82 (L) 4.22 - 5.81 MIL/uL   Hemoglobin 5.4 (LL) 13.0 - 17.0 g/dL    Comment: REPEATED TO VERIFY THIS CRITICAL RESULT HAS VERIFIED AND BEEN CALLED TO RN CRYSTAL BY ALEXIS Pasco ON 05 03 2022 AT [facility], AND HAS BEEN READ BACK.     HCT 15.1 (L) 39.0 - 52.0 %   MCV 83.0 80.0 - 100.0 fL   MCH 29.7 26.0 - 34.0 pg   MCHC  35.8 30.0 - 36.0 g/dL   RDW 14.7 11.5 - 15.5 %   Platelets 136 (L) 150 - 400 K/uL   nRBC 2.6 (H) 0.0 - 0.2 %    Comment: Performed at [facility], [place] [address], [place], [place] [address]  Differential     Status: Abnormal   Collection Time: 03/11/21  2:31 AM  Result Value Ref Range   Neutrophils  Relative % 82 %   Neutro Abs 9.3 (H) 1.7 - 7.7 K/uL   Lymphocytes Relative 7 %   Lymphs Abs 0.8 0.7 - 4.0 K/uL   Monocytes Relative 8 %   Monocytes Absolute 0.9 0.1 - 1.0 K/uL   Eosinophils Relative 0 %   Eosinophils Absolute 0.0 0.0 - 0.5 K/uL   Basophils Relative 0 %   Basophils Absolute 0.0 0.0 - 0.1 K/uL   Immature Granulocytes 3 %   Abs Immature Granulocytes 0.37 (H) 0.00 - 0.07 K/uL   Reactive, Benign Lymphocytes PRESENT    Target Cells PRESENT     Comment: Performed at [facility], [place] [address] [place], [place] [address]  Glucose, capillary     Status: Abnormal   Collection Time: 03/11/21  3:49 AM  Result Value Ref Range   Glucose-Capillary 130 (H) 70 - 99 mg/dL    Comment: Glucose reference range applies only to samples taken after fasting for at least 8 hours.  Blood gas, arterial     Status: Abnormal   Collection Time: 03/11/21  4:24 AM  Result Value Ref Range   pH, Arterial 7.563 (H) 7.350 - 7.450   pCO2 arterial 32.0 32.0 - 48.0 mmHg   pO2, Arterial 52.8 (L) 83.0 - 108.0 mmHg   Bicarbonate 29.0 (H) 20.0 - 28.0 mmol/L   Acid-Base Excess 6.6 (H) 0.0 - 2.0 mmol/L   O2 Saturation 88.4 %   Patient temperature 97.7     Comment: Performed at [facility], [place] [address] [place], [place] [address]  Type and screen [place]     Status: None (Preliminary result)   Collection Time: 03/11/21  4:50 AM  Result Value Ref Range   ABO/RH(D) O POS    Antibody Screen NEG    Sample Expiration 03/14/2021,2359    Unit Number [id number]    Blood Component Type RED CELLS,LR    Unit division 00    Status of Unit ISSUED    Transfusion Status OK TO TRANSFUSE    Crossmatch Result Compatible    Unit Number [id number]    Blood Component Type RED CELLS,LR    Unit division 00    Status of Unit ISSUED    Transfusion Status OK TO TRANSFUSE    Crossmatch Result      Compatible Performed at [facility], [place] [address] [place], [place] [address]   Prepare RBC (crossmatch)     Status: None   Collection Time: 03/11/21  4:50 AM  Result Value Ref Range   Order Confirmation      ORDER PROCESSED BY BLOOD BANK Performed at [facility], [place] [address], [place], [place] [address]   Lactic acid, plasma     Status: Abnormal   Collection Time: 03/11/21  4:50 AM  Result Value Ref Range   Lactic Acid, Venous 2.3 (HH) 0.5 - 1.9 mmol/L    Comment: CRITICAL RESULT CALLED TO, READ BACK BY AND VERIFIED WITH: CRYSTAL BALDWIN @ 0531 ON 03/11/21 C VARNER Performed at  [facility], [place] [address] [place] [address], [place], [place] [address]   Occult blood card to lab, stool     Status: Abnormal   Collection Time: 03/11/21  6:54 AM  Result Value Ref Range   Fecal Occult Bld POSITIVE (A) NEGATIVE    Comment: Performed at [facility], [place] [address], [place], [place] [address]  Glucose, capillary     Status: Abnormal   Collection Time: 03/11/21  7:43 AM  Result Value Ref Range   Glucose-Capillary 118 (H) 70 - 99 mg/dL    Comment: Glucose reference range applies only to samples taken after fasting for at least 8 hours.  Lactic acid, plasma     Status: Abnormal   Collection Time: 03/11/21  8:09 AM  Result Value Ref Range   Lactic Acid, Venous 2.9 (HH) 0.5 - 1.9 mmol/L    Comment: CRITICAL VALUE NOTED.  VALUE IS CONSISTENT WITH PREVIOUSLY REPORTED AND CALLED VALUE. Performed at [facility], [place] [address], [place], [place] [address]   Glucose, capillary     Status: Abnormal   Collection Time: 03/11/21 11:26 AM  Result Value Ref Range   Glucose-Capillary 136 (H) 70 - 99 mg/dL    Comment: Glucose reference range applies only to samples taken after fasting for at least 8 hours.    Labs:  CBC: Recent Labs  Lab 03/07/21 0513 03/08/21 0520 03/09/21 0211 03/10/21 0233 03/11/21 0231  WBC 7.8 6.8 6.1 7.2 11.4*  NEUTROABS  --   --    --   --  9.3*  HGB 11.5* 10.4* 8.6* 7.9* 5.4*  HCT 34.1* 29.9* 23.7* 21.5* 15.1*  MCV 91.7 85.9 82.9 81.4 83.0  PLT 140* 137* 114* 123* 136*    Basic Metabolic Panel: Recent Labs  Lab 03/07/21 0513 03/08/21 0520 03/09/21 0211 03/10/21 0233 03/11/21 0231  NA 137 137 141 144 147*  K 4.9 4.2 3.8 3.2* 3.2*  CL 111 111 112* 110 111  CO2 15* 17* 18* 23 27  GLUCOSE 66* 99 90 105* 140*  BUN 38* 49* 60* 61* 58*  CREATININE 2.81* 2.97* 2.93* 2.10* 1.48*  CALCIUM 7.9* 8.3* 8.7* 8.6* 8.1*  MG  --  1.8 1.8 2.2 2.2  PHOS  --  3.9  --   --  1.7*   GFR Estimated Creatinine Clearance: 47.3 mL/min (A) (by C-G formula based on SCr of 1.48 mg/dL (H)). Liver Function Tests: Recent Labs  Lab 03/08/21 0520 03/09/21 0211 03/11/21 0231  AST 41 35 30  ALT 30 30 22   ALKPHOS 62 41 46  BILITOT 1.1 0.9 1.2  PROT 5.4* 5.8* 5.1*  ALBUMIN 2.5* 2.7* 2.2*   No results for input(s): LIPASE, AMYLASE in the last 168 hours. No results for input(s): AMMONIA in the last 168 hours. Coagulation profile No results for input(s): INR, PROTIME in the last 168 hours.  Cardiac Enzymes: No results for input(s): CKTOTAL, CKMB, CKMBINDEX, TROPONINI in the last 168 hours. BNP: Invalid input(s): POCBNP CBG: Recent Labs  Lab 03/10/21 1930 03/10/21 2339 03/11/21 0349 03/11/21 0743 03/11/21 1126  GLUCAP 108* 130* 130* 118* 136*   D-Dimer Recent Labs    03/09/21 1053  DDIMER 10.91*   Hgb A1c No results for input(s): HGBA1C in the last 72 hours. Lipid Profile Recent Labs    03/11/21 0231  TRIG 121   Thyroid function studies No results for input(s): TSH, T4TOTAL, T3FREE, THYROIDAB in the last 72 hours.  Invalid input(s): FREET3 Anemia work up  Recent Labs    03/10/21 0233  VITAMINB12 888  FOLATE 7.9  FERRITIN 214  TIBC 205*  IRON 20*  RETICCTPCT 1.0   Urinalysis No results found for: COLORURINE, APPEARANCEUR, LABSPEC, PHURINE, GLUCOSEU, HGBUR, BILIRUBINUR, KETONESUR, PROTEINUR,  UROBILINOGEN, NITRITE, LEUKOCYTESUR  Microbiology Recent Results (from the past 240 hour(s))  SARS CORONAVIRUS 2 (TAT 6-24 HRS) Nasopharyngeal Nasopharyngeal Swab     Status: None   Collection Time: 03/03/21 11:13 AM   Specimen: Nasopharyngeal Swab  Result Value Ref Range Status   SARS Coronavirus 2 NEGATIVE NEGATIVE Final    Comment: (NOTE) SARS-CoV-2 target nucleic acids are NOT DETECTED.  The SARS-CoV-2 RNA is generally detectable in upper and lower respiratory specimens during the acute phase of infection. Negative results do not preclude SARS-CoV-2 infection, do not rule out co-infections with other pathogens, and should not be used as the sole basis for treatment or other patient management decisions. Negative results must be combined with clinical observations, patient history, and epidemiological information. The expected result is Negative.  Fact Sheet for Patients: SugarRoll.be  Fact Sheet for Healthcare Providers: https://www.woods-mathews.com/  This test is not yet approved or cleared by the [place] FDA and  has been authorized for detection and/or diagnosis of SARS-CoV-2 by FDA under an Emergency Use Authorization (EUA). This EUA will remain  in effect (meaning this test can be used) for the duration of the COVID-19 declaration under Se ction 564(b)(1) of the Act, 21 U.S.C. section 360bbb-3(b)(1), unless the authorization is terminated or revoked sooner.  Performed at [facility], [place] [address], [place], [place] [address]   MRSA PCR Screening     Status: None   Collection Time: 03/07/21  4:00 AM   Specimen: Nasopharyngeal  Result Value Ref Range Status   MRSA by PCR NEGATIVE NEGATIVE Final    Comment:        The GeneXpert MRSA Assay (FDA approved for NASAL specimens only), is one component of a comprehensive MRSA colonization surveillance program. It is not intended to diagnose MRSA infection nor to  guide or monitor treatment for MRSA infections. Performed at [facility], [place] [address], [place], [place] [address]   Culture, blood (routine x 2)     Status: None (Preliminary result)   Collection Time: 03/09/21  6:24 PM   Specimen: BLOOD  Result Value Ref Range Status   Specimen Description   Final    BLOOD BLOOD RIGHT HAND Performed at [place] [address], [place], [place] [address]    Special Requests   Final    BOTTLES DRAWN AEROBIC ONLY Blood Culture adequate volume Performed at [place] [address], [place], [place] [address]    Culture   Final    NO GROWTH < 12 HOURS Performed at [place] [address] [place], [place] [address]    Report Status PENDING  Incomplete  Culture, blood (routine x 2)     Status: None (Preliminary result)   Collection Time: 03/09/21  6:24 PM   Specimen: BLOOD  Result Value Ref Range Status   Specimen Description   Final    BLOOD RIGHT ANTECUBITAL Performed at [facility] [address], [place], [place] [address]    Special Requests   Final    BOTTLES DRAWN AEROBIC ONLY Blood Culture adequate volume Performed at [facility] [address], [place], [place] [address]    Culture   Final  NO GROWTH < 12 HOURS Performed at [facility] [address] [place], [place] [address]    Report Status PENDING  Incomplete  Culture, Urine     Status: None   Collection Time: 03/09/21  6:36 PM   Specimen: Urine, Random  Result Value Ref Range Status   Specimen Description   Final    URINE, RANDOM Performed at [facility] [address] [place], [place] [address]    Special Requests NONE  Final   Culture   Final    NO GROWTH Performed at [facility], [place] [address], [place], [place] [address]    Report Status 03/11/2021 FINAL  Final    Inpatient Medications:   Scheduled  Meds: . bacitracin   Topical BID  . Chlorhexidine Gluconate Cloth  6 each Topical Daily  . [START ON 03/13/2021] enoxaparin (LOVENOX) injection  40 mg Subcutaneous Q24H  . insulin aspart  0-9 Units Subcutaneous Q4H  . iohexol  500 mL Oral Q1H  . lip balm  1 application Topical BID  . mouth rinse  15 mL Mouth Rinse BID  . melatonin  3 mg Oral QHS  . pantoprazole (PROTONIX) IV  40 mg Intravenous Q12H  . sodium chloride flush  10-40 mL Intracatheter Q12H  . sodium chloride HYPERTONIC  4 mL Nebulization BID   PRN Meds: sodium chloride, acetaminophen, albumin human, albuterol, alum & mag hydroxide-simeth, dextrose, diphenhydrAMINE, fentaNYL (SUBLIMAZE) injection, haloperidol lactate, magic mouthwash, metoprolol tartrate, ondansetron **OR** ondansetron (ZOFRAN) IV, simethicone, sodium chloride, sodium chloride flush Continuous Infusions: . sodium chloride 10 mL/hr at 03/11/21 1118  . sodium chloride 75 mL/hr at 03/11/21 1118  . sodium chloride    . acetaminophen    . albumin human    . anidulafungin     Followed by  . [START ON 03/12/2021] anidulafungin    . norepinephrine (LEVOPHED) Adult infusion 3 mcg/min (03/11/21 1118)  . piperacillin-tazobactam (ZOSYN)  IV 12.5 mL/hr at 03/11/21 1118  . potassium PHOSPHATE IVPB (in mmol)    . sodium chloride    . TPN ADULT (ION) 40 mL/hr at 03/11/21 1118  . TPN ADULT (ION)      Radiological Exams on Admission: DG Abd 1 View  Result Date: 03/09/2021 CLINICAL DATA:  NG tube placement EXAM: ABDOMEN - 1 VIEW COMPARISON:  None. FINDINGS: NG tube appears adequately positioned in the stomach. Paucity of bowel gas. IMPRESSION: NG tube adequately positioned in the stomach. Electronically Signed   By: Franki Cabot M.D.   On: 03/09/2021 18:33   US RENAL  Result Date: 03/09/2021 CLINICAL DATA:  Acute kidney injury EXAM: RENAL / URINARY TRACT ULTRASOUND COMPLETE COMPARISON:  None. FINDINGS: Right Kidney: Renal measurements: 11.5 x 5.3 x 5.6 cm = volume: 176 mL.  Echogenicity within normal limits. RIGHT renal cyst measures 2.4 cm. No suspicious mass or hydronephrosis. Left Kidney: Renal measurements: 11.5 x 6.5 x 5 cm = volume: 195 mL. Echogenicity within normal limits. No mass or hydronephrosis visualized. Bladder: Decompressed by Foley catheter. Other: None. IMPRESSION: No acute or significant findings.  No hydronephrosis. Electronically Signed   By: Franki Cabot M.D.   On: 03/09/2021 18:35   DG CHEST PORT 1 VIEW  Result Date: 03/09/2021 CLINICAL DATA:  Increasing shortness of breath, wheezing, known rectal cancer EXAM: PORTABLE CHEST 1 VIEW COMPARISON:  03/07/2021 FINDINGS: Single frontal view of the chest demonstrates stable right chest wall port. Cardiac silhouette is stable. No change in bilateral veiling opacities  consistent with consolidation and effusions. No pneumothorax. IMPRESSION: 1. Stable bibasilar consolidation and effusions. Electronically Signed   By: Randa Ngo M.D.   On: 03/09/2021 17:36   VAS [place] LOWER EXTREMITY VENOUS (DVT)  Result Date: 03/09/2021  Lower Venous DVT Study Patient Name:  JACQUIN BALDEZ  Date of Exam:   03/09/2021 Medical Rec #: [id number]      Accession #:    [id number] Date of Birth: 12-29-1950     Patient Gender: M Patient Age:   69Y Exam Location:  [facility] Procedure:      VAS [place] LOWER EXTREMITY VENOUS (DVT) Referring Phys: [id number] Jonnie Finner --------------------------------------------------------------------------------  Indications: Edema.  Comparison Study: no prior Performing Technologist: Abram Sander RVS  Examination Guidelines: A complete evaluation includes B-mode imaging, spectral Doppler, color Doppler, and power Doppler as needed of all accessible portions of each vessel. Bilateral testing is considered an integral part of a complete examination. Limited examinations for reoccurring indications may be performed as noted. The reflux portion of the exam is performed with the patient in reverse  Trendelenburg.  +---------+---------------+---------+-----------+----------+--------------+ RIGHT    CompressibilityPhasicitySpontaneityPropertiesThrombus Aging +---------+---------------+---------+-----------+----------+--------------+ CFV      Full           Yes      Yes                                 +---------+---------------+---------+-----------+----------+--------------+ SFJ      Full                                                        +---------+---------------+---------+-----------+----------+--------------+ FV Prox  Full                                                        +---------+---------------+---------+-----------+----------+--------------+ FV Mid   Full                                                        +---------+---------------+---------+-----------+----------+--------------+ FV DistalFull                                                        +---------+---------------+---------+-----------+----------+--------------+ PFV      Full                                                        +---------+---------------+---------+-----------+----------+--------------+ POP      Full           Yes      Yes                                 +---------+---------------+---------+-----------+----------+--------------+  PTV      Full                                                        +---------+---------------+---------+-----------+----------+--------------+ PERO     Full                                                        +---------+---------------+---------+-----------+----------+--------------+   +---------+---------------+---------+-----------+----------+--------------+ LEFT     CompressibilityPhasicitySpontaneityPropertiesThrombus Aging +---------+---------------+---------+-----------+----------+--------------+ CFV      Full           Yes      Yes                                  +---------+---------------+---------+-----------+----------+--------------+ SFJ      Full                                                        +---------+---------------+---------+-----------+----------+--------------+ FV Prox  Full                                                        +---------+---------------+---------+-----------+----------+--------------+ FV Mid   Full                                                        +---------+---------------+---------+-----------+----------+--------------+ FV DistalFull                                                        +---------+---------------+---------+-----------+----------+--------------+ PFV      Full                                                        +---------+---------------+---------+-----------+----------+--------------+ POP      Full           Yes      Yes                                 +---------+---------------+---------+-----------+----------+--------------+ PTV      Full                                                        +---------+---------------+---------+-----------+----------+--------------+  PERO     Full                                                        +---------+---------------+---------+-----------+----------+--------------+     Summary: BILATERAL: - No evidence of deep vein thrombosis seen in the lower extremities, bilaterally. - No evidence of superficial venous thrombosis in the lower extremities, bilaterally. -No evidence of popliteal cyst, bilaterally.   *See table(s) above for measurements and observations. Electronically signed by Harold Barban MD on 03/09/2021 at 4:26:21 PM.    Final    [place] EKG SITE RITE  Result Date: 03/10/2021 If Site Rite image not attached, placement could not be confirmed due to current cardiac rhythm.   Thank you for this consultation.  Our [facility] hospitalist team will follow the patient with you.   Time spent: Greater than 50% of the  35 minute visit was spent in counseling/coordination of care for the patient as laid out in the A&P.   Dwyane Dee M.D. [facility] 03/11/2021, 11:46 AM Pager: Secure chat

## 2021-03-11 NOTE — Significant Event (Addendum)
OVERNIGHT COVERAGE CRITICAL CARE PROGRESS NOTE  CTSP re: hypotension.  Dr. Sidney Ace called stating that the patient has had MAP < 65 all night long.  Norepinephrine was started in addition to IV fluid bolus (NS 750 mL).  Also, bicarbonate infusion is running at 100 mL/hr.  Hgb 7.9-->5.4.  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  Also has coffee ground-looking output via NG tube.  2 units PRBC ordered.  On exam, MAP improved (68-70) on norepinephrine @ 2 mcg/min.  RR 25, SpO2 97%.  He is awake but a bit lethargic.  Not very interactive but denies pain.  Upper airway rattle.  Lungs are clear on auscultation.  Tachycardic with regular rate and rhythm.  No murmur, rub or gallop.  Agree with current management. Hold scheduled metoprolol. Needs blood. Check ABG.  Check lactate. Will have PCCM follow in am.  Renee Pain, MD Board Certified by the ABIM, [place]

## 2021-03-11 NOTE — Progress Notes (Addendum)
Initial Nutrition Assessment  DOCUMENTATION CODES:  Non-severe (moderate) malnutrition in context of chronic illness  INTERVENTION:  Continue TPN per Pharmacy.  Advance to diet if medically able.  NUTRITION DIAGNOSIS:  Moderate Malnutrition related to chronic illness,cancer and cancer related treatments as evidenced by mild fat depletion,moderate fat depletion,mild muscle depletion,moderate muscle depletion,severe muscle depletion.  GOAL:  Patient will meet greater than or equal to 90% of their needs  MONITOR:  Diet advancement,Skin,Labs,Weight trends,I & O's  REASON FOR ASSESSMENT:  Consult New TPN/TNA  ASSESSMENT:  70 yo male with a PMH of arthritis and recent dx (08/2020) of rectal cancer (current treatment of FolFox) who presents with rectal bleeding. Pt with AKI and ileus. 4/27 - anterior resection with end colostomy; clear liquid diet 4/28 - NPO 4/30 - advanced to clear liquids then back to NPO 5/1 - NGT placed 5/2 - started on TPN  Spoke with pt and family member at bedside. Pt and family member report he was eating well at home, 3 meals per day, before all of this happened with no appetite changes.  Denies any noticeable weight loss. Per Epic, pt has lost 16 lbs (8.7%) of his body weight. However, given history of edema, it is difficult to tell whether or not this loss was fluid or true weight loss.  On exam, pt has mild-moderate/severe depletions in both fat and muscle stores, indicating moderate malnutrition. Pt at risk for severe malnutrition.  Recommend continuing TPN until ileus resolves then starting PO diet again. If PO is difficult or intake is low, consider tube feeding for nutrition.  Medications: SSI, Protonix, IVF @ 75 ml/hr, Levophed, Zosyn, KCl @ 10 mEq per IV Labs: reviewed; Na 147, K 3.2, serum Ca 8.1, Phos 1.7, CBG 97-130  NUTRITION - FOCUSED PHYSICAL EXAM: Flowsheet Row Most Recent Value  Orbital Region Moderate depletion  Upper Arm Region  Moderate depletion  Thoracic and Lumbar Region Mild depletion  Buccal Region Moderate depletion  Temple Region Severe depletion  Clavicle Bone Region Severe depletion  Clavicle and Acromion Bone Region Moderate depletion  Scapular Bone Region Unable to assess  Dorsal Hand Mild depletion  Patellar Region Mild depletion  Anterior Thigh Region Mild depletion  Posterior Calf Region Moderate depletion  Edema (RD Assessment) None  Hair Reviewed  Eyes Reviewed  Mouth Reviewed  Skin Reviewed  Nails Reviewed     Diet Order:   Diet Order            Diet NPO time specified Except for: Ice Chips  Diet effective now                EDUCATION NEEDS:  Education needs have been addressed  Skin:  Skin Assessment: Skin Integrity Issues: Skin Integrity Issues:: Incisions Incisions: Abdomen, closed  Last BM:  Ostomy - 200 ml output in last 24 hrs (Type 7, black in color, medium amount)  Height:  Ht Readings from Last 1 Encounters:  02/26/21 5\' 10"  (1.778 m)   Weight:  Wt Readings from Last 1 Encounters:  03/10/21 76.2 kg   Ideal Body Weight:  75.5 kg  BMI:  Body mass index is 24.1 kg/m.  Estimated Nutritional Needs:  Kcal:  2200-2400 Protein:  90-115 grams Fluid:  >2 L  Derrel Nip, RD, LDN Registered Dietitian After Hours/Weekend Pager # in [place]

## 2021-03-11 NOTE — Progress Notes (Signed)
The patient was hypotensive earlier with diastolic BP in the 89Q and  MAP of 49.  He was ordered a bolus of 250 normal saline later 500 mL.  His MAP consistently stayed below 65.  He was very restless earlier and around 01 13 was given 25 mg IV Benadryl.  His IV Lopressor was held off.  He was fairly lethargic.  He has been on bicarbonate drip at 50 mill per hour that was increased to 100 mL/h.  Given persistent hypotension I started him on IV Levophed.  Hemoglobin and hematocrit came back 5.4 and 15.1 down from 7.9/21.5 on 5/2 yesterday.  We will type and crossmatch and transfuse 2 units of packed red blood cells.  I discussed his case with the intensivist Dr. Carson Myrtle who will assume his care with the intensivist team.  We will continue his IV Zosyn and other current plan of care.

## 2021-03-11 NOTE — Progress Notes (Signed)
Pharmacy Antibiotic Note  Shane [name] is a 70 y.o. male admitted on 03/05/2021 with rectal cancer s/p colectomy.  Pharmacy has been consulted for Zosyn dosing.  Day 3 Zosyn WBC 11.4 up SCr improving Tmax 101.6 on 5/2  Plan: Continue current Zosyn dosing Eraxis added per [facility] Monitor renal function and cx data    Weight: 76.2 kg (167 lb 15.9 oz)  Temp (24hrs), Avg:99.8 F (37.7 C), Min:97.7 F (36.5 C), Max:101.6 F (38.7 C)  Recent Labs  Lab 03/07/21 0513 03/08/21 0520 03/09/21 0211 03/10/21 0233 03/11/21 0231 03/11/21 0450 03/11/21 0809  WBC 7.8 6.8 6.1 7.2 11.4*  --   --   CREATININE 2.81* 2.97* 2.93* 2.10* 1.48*  --   --   LATICACIDVEN  --   --   --   --   --  2.3* 2.9*    Estimated Creatinine Clearance: 47.3 mL/min (A) (by C-G formula based on SCr of 1.48 mg/dL (H)).    Allergies  Allergen Reactions  . Nsaids Other (See Comments)    Kidney disease    Antimicrobials this admission: 4/30 Zosyn >>  5/3 Eraxis >>    Dose adjustments this admission:  Microbiology results: 5/1 UCx: ngf 5/1 BCx: ngtd 4/29 MRSA PCR: neg   Thank you for allowing pharmacy to be a part of this patient's care.  Kara Mead PharmD 03/11/2021 10:51 AM

## 2021-03-11 NOTE — Progress Notes (Signed)
Patient with critical Hemoglobin of 5.4. MD J. Mansy notified. Will continue to monitor patient.

## 2021-03-11 NOTE — Progress Notes (Addendum)
Shane [name] [id number] 1950-11-14  CARE TEAM:  PCP: Marrian Salvage, [place]  Outpatient Care Team: Patient Care Team: Marrian Salvage, [place] as PCP - General (Internal Medicine) Benay Spice Izola Price, MD as Consulting Physician (Oncology) Jonnie Finner, RN as Oncology Nurse Navigator Heilingoetter, Tobe Sos, PA-C as Physician Assistant (Physician Assistant) Leighton Ruff, MD as Consulting Physician (General Surgery) Izora Gala, MD as Consulting Physician (Otolaryngology) Lavena Bullion, DO as Consulting Physician (Gastroenterology)  Inpatient Treatment Team: Treatment Team: Attending Provider: Leighton Ruff, MD; Technician: Ernest Mallick, NT; [place] Nurse: Lissa Morales Rudi Heap, RN; Consulting Physician: Michael Boston, MD; Rounding Team: Pccm, Md, MD; Consulting Physician: Edison Pace, Md, MD; Consulting Physician: Redmond Baseman, MD; Consulting Physician: Dwyane Dee, MD; Registered Nurse: Santa Lighter, RN; Consulting Physician: Renee Pain, MD; Case Manager: Frann Rider, RN; Registered Nurse: Justice Britain, RN; Pharmacist: Adrian Saran, [facility]   Problem List:   Principal Problem:   Rectal carcinoma [facility]) Active Problems:   Degenerative cervical disc   Abnormality of gait   Iron deficiency anemia   Port-A-Cath in place   AK[facility]acute [facility])   Hypercholesteremia   Pulmonary nodules   Hypoxia   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 Nicki Reaper Macdiarmid     Assessment  GUARDED with probable UGIB from gastritis & hemorrhagic shock [facility] Stay = 6 days)  Plan:  NGT decompression.  I reordered PPI & inc to BID.  If recurrent problem, may need EGD by GI - hold off for now since does not alter plan today.    TRANSFUSE STAT. - volume for his shock.  Get off pressors after pRBCs & CT scan - stress to GI & kidneys - not ideal  Hold heparin x 48hr  CT scan r/o abscess since drain different & more distended.  May have rectal stump leak - NTD for now.  Ideally would be enteral & IV contrast but may not be possible with CKD/AKI & major ileus..  See if at least NGT contrast possible  Continue IV Zosyn.  Add Eraxis for now  Reminded ICU that surgery is primary  TNA for expected persistent ileus.  Place PICC line.  Creatinine elevated but nonoliguric and improving so I do not think he is at risk for dialysis.  Sips for now.   -Hypoxia/tachypnea from abdominal distention -tachypnea improving.  No worsening hypoxia.  Reassuring.  Doubt pneumonia.  No severe hypoxia.  Internal medicine requested pulmonary evaluation.  I think they are getting a pulmonary VQ scan just in case but I am skeptical that the patient has a PE.  .  -keep in ICU  -IVF.  Switched to bicarb.  Switched off ? - switch to NS w need for blood, etc  Do have maintenance with albumin bolus backup since he seemed responsive to albumin bolus yesterday.    -AKI - low but not oliguric.  Cr now trending down.  I would hold off on any nephrology consult for now since  creatinine plateauing, starting to diurese, and medicine coming on board first.  Patient not a good candidate for dialysis at this point anyway  -Confusion from prolonged anesthesia.  Most likely consistent with ICU to confusion/psychosis.  He seemed better now that his abdominal distention has waned and he can breathe more comfortably.  Exhausted since he has not been able  to sleep well.  No evidence of hypercarbia.  We switched his narcotics around -seems less sedated on fentanyl as opposed to Dilaudid.  Not asking for much narcotics..  Not having any more hypoglycemia issues.  He is able to follow commands and does not have any focal neurological deficits to my examination, arguing against any TIA/stroke.  I have paged [place] [facility] to be involved to see if we are missing anything & help follow.   Dr Marylyn Ishihara to see.  Continue scheduled Tylenol.  PRN Robaxin, low-dose femtanyl.  -tachycardia - low dose metoprolol with very low threshold to hold if is hypotensive.  Heart rate in the 110s now as opposed to 130s.  An improvement but still concerning.  Cardiology consult if goes into A. fib.  We will see.  -VTE prophylaxis- SCDs only.  Restart enoxaparin when Hgb stable & no procedure needed  -f/u Pathology from resection.  Suspect at least partial pathologic response and given the tumor shrinkage and massive fibrosis.  -TNA if not opened up by POD#7 - hold off for now w fluid overload & renal issues  -mobilize as tolerated to help recovery.  See if we can get him up to a chair.  Disposition:  Disposition:  The patient is from: Home  Anticipate discharge to:  [facility] (SNF)  Anticipated Date of Discharge is:  May 8,2022   Barriers to discharge:  Pending Clinical improvement (more likely than not)  Patient currently is NOT MEDICALLY STABLE for discharge from the hospital from a surgery standpoint.    I updated the patient's status to the patient  Recommendations were made.  Questions were answered.  They expressed understanding & appreciation.    35 minutes spent in review, evaluation, examination, counseling, and coordination of care.   I have reviewed this patient's available data, including medical history, events of note, physical examination and test results as part of my evaluation.  A significant portion of that time was spent in counseling.   Care during the described time interval was provided by me.  03/11/2021    Subjective: (Chief complaint)  Worse BP Fallen Hgb  NGT & ostomy black output C/w UGIB ICU nursing & RT in room  Objective:  Vital signs:  Vitals:   03/11/21 0645 03/11/21 0700 03/11/21 0715 03/11/21 0718  BP: (!) 143/56 (!) 123/35 (!) 122/33   Pulse:      Resp: (!) 28 (!) 28 (!) 33   Temp:      TempSrc:      SpO2:    100%  Weight:        Last BM Date: 03/10/21  Intake/Output   Yesterday:  05/02 0701 - 05/03 0700 In: 2524.6 [I.V.:1851.3; IV Piggyback:673.3] Out: 6629 [Urine:3055; Emesis/NG output:650; Stool:200] This shift:  No intake/output data recorded.  Bowel function:  Flatus: YES  BM:  YES - black  Drain: grey/green but low volume   Physical Exam:  General: Pt awake/alert in mild acute distress.  Tired Eyes: PERRL, normal EOM.  Sclera clear.  No icterus Neuro: CN II-XII intact w/o focal sensory/motor deficits. Lymph: No head/neck/groin lymphadenopathy Psych:  No  delerium/psychosis/paranoia.  Oriented x 4 HENT: Normocephalic, Mucus membranes moist.  No thrush Neck: Supple, No tracheal deviation.  No obvious thyromegaly Chest: No pain to chest wall compression.  Fair respiratory excursion.  No audible wheezing CV:  Pulses intact.  Regular rhythm.  No major extremity edema MS: Normal AROM mjr joints.  No obvious deformity  Abdomen: Somewhat firm.  Moderately distended.  Moderate tenderness to deep palpation - worse.  Ostomy pink with gas & liquid stool in bag.  Incisions clean,dry, & intact with no purulence or bleeding.  No cellulitis..    GU: Foley in place.  Bilateral large scrotal hydroceles stable.  No hernias. Ext:  No deformity.  2+ mjr edema.  No cyanosis Skin: No petechiae / purpurea.  No major sores.  Warm and dry    Results:   Cultures: Recent Results (from the past 720 hour(s))  SARS CORONAVIRUS 2 (TAT 6-24 HRS) Nasopharyngeal Nasopharyngeal Swab     Status:  None   Collection Time: 03/03/21 11:13 AM   Specimen: Nasopharyngeal Swab  Result Value Ref Range Status   SARS Coronavirus 2 NEGATIVE NEGATIVE Final    Comment: (NOTE) SARS-CoV-2 target nucleic acids are NOT DETECTED.  The SARS-CoV-2 RNA is generally detectable in upper and lower respiratory specimens during the acute phase of infection. Negative results do not preclude SARS-CoV-2 infection, do not rule out co-infections with other pathogens, and should not be used as the sole basis for treatment or other patient management decisions. Negative results must be combined with clinical observations, patient history, and epidemiological information. The expected result is Negative.  Fact Sheet for Patients: SugarRoll.be  Fact Sheet for Healthcare Providers: https://www.woods-mathews.com/  This test is not yet approved or cleared by the [place] FDA and  has been authorized for detection and/or diagnosis of SARS-CoV-2 by FDA under an Emergency Use Authorization (EUA). This EUA will remain  in effect (meaning this test can be used) for the duration of the COVID-19 declaration under Se ction 564(b)(1) of the Act, 21 U.S.C. section 360bbb-3(b)(1), unless the authorization is terminated or revoked sooner.  Performed at [facility], [place] [address], [place], [place] [address]   MRSA PCR Screening     Status: None   Collection Time: 03/07/21  4:00 AM   Specimen: Nasopharyngeal  Result Value Ref Range Status   MRSA by PCR NEGATIVE NEGATIVE Final    Comment:        The GeneXpert MRSA Assay (FDA approved for NASAL specimens only), is one component of a comprehensive MRSA colonization surveillance program. It is not intended to diagnose MRSA infection nor to guide or monitor treatment for MRSA infections. Performed at [facility], [place] [address], [place], [place] [address]   Culture, blood (routine x 2)      Status: None (Preliminary result)   Collection Time: 03/09/21  6:24 PM   Specimen: BLOOD  Result Value Ref Range Status   Specimen Description   Final    BLOOD BLOOD RIGHT HAND Performed at [place] [address], [place], [place] [address]    Special Requests   Final    BOTTLES DRAWN AEROBIC ONLY Blood Culture adequate volume Performed at [place] [address] [place], [place] [address]    Culture   Final    NO GROWTH < 12 HOURS Performed at [place] [address] [place], [place] [address]    Report Status PENDING  Incomplete  Culture, blood (routine x 2)     Status: None (Preliminary result)   Collection Time: 03/09/21  6:24 PM   Specimen: BLOOD  Result Value Ref Range Status   Specimen Description   Final    BLOOD RIGHT ANTECUBITAL Performed at [place] [address], [place], [place] [address]    Special Requests   Final    BOTTLES DRAWN AEROBIC ONLY Blood Culture adequate volume Performed at [place] [address] [place], [place] [address]    Culture   Final    NO GROWTH < 12 HOURS Performed at [facility] [address], [place], [place] [address]    Report Status PENDING  Incomplete  Culture, Urine     Status: None   Collection Time: 03/09/21  6:36 PM   Specimen: Urine, Random  Result Value Ref Range Status   Specimen Description   Final    URINE, RANDOM Performed at [place] [address], [place], [place] [address]    Special Requests NONE  Final   Culture   Final    NO GROWTH Performed at [facility], [place] [address], [place], [place] [address]    Report Status 03/11/2021 FINAL  Final    Labs: Results for orders placed or performed during the hospital encounter of 03/05/21 (from the past 48 hour(s))  D-dimer, quantitative     Status: Abnormal   Collection Time: 03/09/21 10:53 AM  Result Value Ref  Range   D-Dimer, Quant 10.91 (H) 0.00 - 0.50 ug/mL-FEU    Comment: (NOTE) At the manufacturer cut-off value of 0.5 g/mL FEU, this assay has a negative predictive value of 95-100%.This assay is intended for use in conjunction with a clinical pretest probability (PTP) assessment model to exclude pulmonary embolism (PE) and deep venous thrombosis (DVT) in outpatients suspected of PE or DVT. Results should be correlated with clinical presentation. Performed at [facility], [place] [address], [place], [place] [address]   Glucose, capillary     Status: None   Collection Time: 03/09/21 11:42 AM  Result Value Ref Range   Glucose-Capillary 86 70 - 99 mg/dL    Comment: Glucose reference range applies only to samples taken after fasting for at least 8 hours.  Glucose, capillary     Status: None   Collection Time: 03/09/21  3:31 PM  Result Value Ref Range   Glucose-Capillary 88 70 - 99 mg/dL    Comment: Glucose reference range applies only to samples taken after fasting for at least 8 hours.  Culture, blood (routine x 2)     Status: None (Preliminary result)   Collection Time: 03/09/21  6:24 PM   Specimen: BLOOD  Result Value Ref Range   Specimen Description      BLOOD BLOOD RIGHT HAND Performed at [place] [address], [place], [place] [address]    Special Requests      BOTTLES DRAWN AEROBIC ONLY Blood Culture adequate volume Performed at [place] [address] [place], [place] [address]    Culture      NO GROWTH < 12 HOURS Performed at [place] [address], [place], [place] [address]    Report Status PENDING   Culture, blood (routine x 2)     Status: None (Preliminary result)   Collection Time: 03/09/21  6:24 PM   Specimen: BLOOD  Result Value Ref Range  Specimen Description      BLOOD RIGHT ANTECUBITAL Performed at [place] [address], [place], [place] [address]     Special Requests      BOTTLES DRAWN AEROBIC ONLY Blood Culture adequate volume Performed at [place] [address] [place], [place] [address]    Culture      NO GROWTH < 12 HOURS Performed at [place] [address] [place], [place] [address]    Report Status PENDING   Culture, Urine     Status: None   Collection Time: 03/09/21  6:36 PM   Specimen: Urine, Random  Result Value Ref Range   Specimen Description      URINE, RANDOM Performed at [place] [address], [place], [place] [address]    Special Requests NONE    Culture      NO GROWTH Performed at [facility], [place] [address] [place], [place] [address]    Report Status 03/11/2021 FINAL   Glucose, capillary     Status: Abnormal   Collection Time: 03/09/21  7:44 PM  Result Value Ref Range   Glucose-Capillary 66 (L) 70 - 99 mg/dL    Comment: Glucose reference range applies only to samples taken after fasting for at least 8 hours.  Glucose, capillary     Status: None   Collection Time: 03/09/21  8:31 PM  Result Value Ref Range   Glucose-Capillary 98 70 - 99 mg/dL    Comment: Glucose reference range applies only to samples taken after fasting for at least 8 hours.  Glucose, capillary     Status: None   Collection Time: 03/10/21  1:25 AM  Result Value Ref Range   Glucose-Capillary 94 70 - 99 mg/dL    Comment: Glucose reference range applies only to samples taken after fasting for at least 8 hours.  CBC     Status: Abnormal   Collection Time: 03/10/21  2:33 AM  Result Value Ref Range   WBC 7.2 4.0 - 10.5 K/uL   RBC 2.64 (L) 4.22 - 5.81 MIL/uL   Hemoglobin 7.9 (L) 13.0 - 17.0 g/dL   HCT 21.5 (L) 39.0 - 52.0 %   MCV 81.4 80.0 - 100.0 fL   MCH 29.9 26.0 - 34.0 pg   MCHC 36.7 (H) 30.0 - 36.0 g/dL   RDW 14.3 11.5 - 15.5 %   Platelets 123 (L) 150 - 400 K/uL   nRBC 1.4 (H) 0.0 - 0.2 %    Comment: Performed at [facility], [place]  [address] [place], [place] [id number]  Basic metabolic panel     Status: Abnormal   Collection Time: 03/10/21  2:33 AM  Result Value Ref Range   Sodium 144 135 - 145 mmol/L   Potassium 3.2 (L) 3.5 - 5.1 mmol/L   Chloride 110 98 - 111 mmol/L   CO2 23 22 - 32 mmol/L   Glucose, Bld 105 (H) 70 - 99 mg/dL    Comment: Glucose reference range applies only to samples taken after fasting for at least 8 hours.   BUN 61 (H) 8 - 23 mg/dL   Creatinine, Ser 2.10 (H) 0.61 - 1.24 mg/dL   Calcium 8.6 (L) 8.9 - 10.3 mg/dL   GFR, Estimated 33 (L) >60 mL/min    Comment: (NOTE) Calculated using the CKD-EPI Creatinine Equation (2021)    Anion gap 11 5 - 15    Comment: Performed at [facility]  [place] [address], [place], [place] [address]  Vitamin B12     Status: None   Collection Time: 03/10/21  2:33 AM  Result Value Ref Range   Vitamin B-12 888 180 - 914 pg/mL    Comment: (NOTE) This assay is not validated for testing neonatal or myeloproliferative syndrome specimens for Vitamin B12 levels. Performed at [facility], [place] [address] [place], [place] [address]   Folate     Status: None   Collection Time: 03/10/21  2:33 AM  Result Value Ref Range   Folate 7.9 >5.9 ng/mL    Comment: Performed at [facility], [place] [address], [place], [place] [address]  Iron and TIBC     Status: Abnormal   Collection Time: 03/10/21  2:33 AM  Result Value Ref Range   Iron 20 (L) 45 - 182 ug/dL   TIBC 205 (L) 250 - 450 ug/dL   Saturation Ratios 10 (L) 17.9 - 39.5 %   UIBC 185 ug/dL    Comment: Performed at [facility], [place] [address], [place], [place] [address]  Ferritin     Status: None   Collection Time: 03/10/21  2:33 AM  Result Value Ref Range   Ferritin 214 24 - 336 ng/mL    Comment: Performed at [facility], [place] [address] [place], [place] [address]  Reticulocytes     Status: Abnormal   Collection Time: 03/10/21   2:33 AM  Result Value Ref Range   Retic Ct Pct 1.0 0.4 - 3.1 %   RBC. 2.68 (L) 4.22 - 5.81 MIL/uL   Retic Count, Absolute 26.5 19.0 - 186.0 K/uL   Immature Retic Fract 12.1 2.3 - 15.9 %    Comment: Performed at [facility], [place] [address], [place], [place] [address]  Magnesium     Status: None   Collection Time: 03/10/21  2:33 AM  Result Value Ref Range   Magnesium 2.2 1.7 - 2.4 mg/dL    Comment: Performed at [facility], [place] [address], [place],  [address]  Glucose, capillary     Status: Abnormal   Collection Time: 03/10/21  3:16 AM  Result Value Ref Range   Glucose-Capillary 104 (H) 70 - 99 mg/dL    Comment: Glucose reference range applies only to samples taken after fasting for at least 8 hours.  Glucose, capillary     Status: Abnormal   Collection Time: 03/10/21  7:31 AM  Result Value Ref Range   Glucose-Capillary 102 (H) 70 - 99 mg/dL    Comment: Glucose reference range applies only to samples taken after fasting for at least 8 hours.  Glucose, capillary     Status: Abnormal   Collection Time: 03/10/21 12:14 PM  Result Value Ref Range   Glucose-Capillary 118 (H) 70 - 99 mg/dL    Comment: Glucose reference range applies only to samples taken after fasting for at least 8 hours.  Glucose, capillary     Status: None   Collection Time: 03/10/21  3:57 PM  Result Value Ref Range   Glucose-Capillary 97 70 - 99 mg/dL    Comment: Glucose reference range applies only to samples taken after fasting for at least 8 hours.  Glucose, capillary     Status: Abnormal   Collection Time: 03/10/21  7:30 PM  Result Value Ref Range   Glucose-Capillary 108 (H) 70 - 99 mg/dL    Comment: Glucose reference range applies  only to samples taken after fasting for at least 8 hours.  Glucose, capillary     Status: Abnormal   Collection Time: 03/10/21 11:39 PM  Result Value Ref Range   Glucose-Capillary 130 (H) 70 - 99 mg/dL    Comment: Glucose reference  range applies only to samples taken after fasting for at least 8 hours.  Comprehensive metabolic panel     Status: Abnormal   Collection Time: 03/11/21  2:31 AM  Result Value Ref Range   Sodium 147 (H) 135 - 145 mmol/L   Potassium 3.2 (L) 3.5 - 5.1 mmol/L   Chloride 111 98 - 111 mmol/L   CO2 27 22 - 32 mmol/L   Glucose, Bld 140 (H) 70 - 99 mg/dL    Comment: Glucose reference range applies only to samples taken after fasting for at least 8 hours.   BUN 58 (H) 8 - 23 mg/dL   Creatinine, Ser 1.48 (H) 0.61 - 1.24 mg/dL   Calcium 8.1 (L) 8.9 - 10.3 mg/dL   Total Protein 5.1 (L) 6.5 - 8.1 g/dL   Albumin 2.2 (L) 3.5 - 5.0 g/dL   AST 30 15 - 41 U/L   ALT 22 0 - 44 U/L   Alkaline Phosphatase 46 38 - 126 U/L   Total Bilirubin 1.2 0.3 - 1.2 mg/dL   GFR, Estimated 50 (L) >60 mL/min    Comment: (NOTE) Calculated using the CKD-EPI Creatinine Equation (2021)    Anion gap 9 5 - 15    Comment: Performed at [facility], [place] [address] [place], [place] [address]  Prealbumin     Status: Abnormal   Collection Time: 03/11/21  2:31 AM  Result Value Ref Range   Prealbumin <5 (L) 18 - 38 mg/dL    Comment: Performed at [facility], [place] [address], [place], [place] [address]  Magnesium     Status: None   Collection Time: 03/11/21  2:31 AM  Result Value Ref Range   Magnesium 2.2 1.7 - 2.4 mg/dL    Comment: Performed at [facility], [place] [address], [place], [place] [address]  Phosphorus     Status: Abnormal   Collection Time: 03/11/21  2:31 AM  Result Value Ref Range   Phosphorus 1.7 (L) 2.5 - 4.6 mg/dL    Comment: Performed at [facility], [place] [address] [place], [place] [address]  Triglycerides     Status: None   Collection Time: 03/11/21  2:31 AM  Result Value Ref Range   Triglycerides 121 <150 mg/dL    Comment: Performed at [facility], [place] [address], [place], [place] [address]  CBC     Status:  Abnormal   Collection Time: 03/11/21  2:31 AM  Result Value Ref Range   WBC 11.4 (H) 4.0 - 10.5 K/uL   RBC 1.82 (L) 4.22 - 5.81 MIL/uL   Hemoglobin 5.4 (LL) 13.0 - 17.0 g/dL    Comment: REPEATED TO VERIFY THIS CRITICAL RESULT HAS VERIFIED AND BEEN CALLED TO RN CRYSTAL BY ALEXIS Mineral ON 05 03 2022 AT [place], AND HAS BEEN READ BACK.     HCT 15.1 (L) 39.0 - 52.0 %   MCV 83.0 80.0 - 100.0 fL   MCH 29.7 26.0 - 34.0 pg   MCHC 35.8 30.0 - 36.0 g/dL   RDW 14.7 11.5 - 15.5 %   Platelets 136 (L) 150 - 400 K/uL   nRBC 2.6 (H) 0.0 - 0.2 %  Comment: Performed at [facility], [place] [address] [place], [place] [address]  Differential     Status: Abnormal   Collection Time: 03/11/21  2:31 AM  Result Value Ref Range   Neutrophils Relative % 82 %   Neutro Abs 9.3 (H) 1.7 - 7.7 K/uL   Lymphocytes Relative 7 %   Lymphs Abs 0.8 0.7 - 4.0 K/uL   Monocytes Relative 8 %   Monocytes Absolute 0.9 0.1 - 1.0 K/uL   Eosinophils Relative 0 %   Eosinophils Absolute 0.0 0.0 - 0.5 K/uL   Basophils Relative 0 %   Basophils Absolute 0.0 0.0 - 0.1 K/uL   Immature Granulocytes 3 %   Abs Immature Granulocytes 0.37 (H) 0.00 - 0.07 K/uL   Reactive, Benign Lymphocytes PRESENT    Target Cells PRESENT     Comment: Performed at [facility], [place] [address], [place], [place] [address]  Glucose, capillary     Status: Abnormal   Collection Time: 03/11/21  3:49 AM  Result Value Ref Range   Glucose-Capillary 130 (H) 70 - 99 mg/dL    Comment: Glucose reference range applies only to samples taken after fasting for at least 8 hours.  Blood gas, arterial     Status: Abnormal   Collection Time: 03/11/21  4:24 AM  Result Value Ref Range   pH, Arterial 7.563 (H) 7.350 - 7.450   pCO2 arterial 32.0 32.0 - 48.0 mmHg   pO2, Arterial 52.8 (L) 83.0 - 108.0 mmHg   Bicarbonate 29.0 (H) 20.0 - 28.0 mmol/L   Acid-Base Excess 6.6 (H) 0.0 - 2.0 mmol/L   O2 Saturation 88.4 %   Patient  temperature 97.7     Comment: Performed at [facility], [place] [address], [place], [place] [address]  Type and screen [place]     Status: None (Preliminary result)   Collection Time: 03/11/21  4:50 AM  Result Value Ref Range   ABO/RH(D) O POS    Antibody Screen NEG    Sample Expiration 03/14/2021,2359    Unit Number [id number]    Blood Component Type RED CELLS,LR    Unit division 00    Status of Unit ISSUED    Transfusion Status OK TO TRANSFUSE    Crossmatch Result      Compatible Performed at [facility], [place] [address] [place] [address], [place], [place] [address]    Unit Number [id number]    Blood Component Type RED CELLS,LR    Unit division 00    Status of Unit ALLOCATED    Transfusion Status OK TO TRANSFUSE    Crossmatch Result Compatible   Prepare RBC (crossmatch)     Status: None   Collection Time: 03/11/21  4:50 AM  Result Value Ref Range   Order Confirmation      ORDER PROCESSED BY BLOOD BANK Performed at [facility], [place] [address], [place], [place] [address]   Lactic acid, plasma     Status: Abnormal   Collection Time: 03/11/21  4:50 AM  Result Value Ref Range   Lactic Acid, Venous 2.3 (HH) 0.5 - 1.9 mmol/L    Comment: CRITICAL RESULT CALLED TO, READ BACK BY AND VERIFIED WITH: CRYSTAL BALDWIN @ 0531 ON 03/11/21 Sandy Salaam Performed at [facility] [address], [place], [place] [address]   Occult blood card to lab, stool     Status: Abnormal   Collection Time: 03/11/21  6:54 AM  Result  Value Ref Range   Fecal Occult Bld POSITIVE (A) NEGATIVE    Comment: Performed at [facility], [place] [address] [place], [place] [address]    Imaging / Studies: DG Abd 1 View  Result Date: 03/09/2021 CLINICAL DATA:  NG tube placement EXAM: ABDOMEN - 1 VIEW COMPARISON:  None. FINDINGS: NG tube appears adequately positioned in the stomach. Paucity of bowel gas. IMPRESSION: NG  tube adequately positioned in the stomach. Electronically Signed   By: Franki Cabot M.D.   On: 03/09/2021 18:33   US RENAL  Result Date: 03/09/2021 CLINICAL DATA:  Acute kidney injury EXAM: RENAL / URINARY TRACT ULTRASOUND COMPLETE COMPARISON:  None. FINDINGS: Right Kidney: Renal measurements: 11.5 x 5.3 x 5.6 cm = volume: 176 mL. Echogenicity within normal limits. RIGHT renal cyst measures 2.4 cm. No suspicious mass or hydronephrosis. Left Kidney: Renal measurements: 11.5 x 6.5 x 5 cm = volume: 195 mL. Echogenicity within normal limits. No mass or hydronephrosis visualized. Bladder: Decompressed by Foley catheter. Other: None. IMPRESSION: No acute or significant findings.  No hydronephrosis. Electronically Signed   By: Franki Cabot M.D.   On: 03/09/2021 18:35   DG CHEST PORT 1 VIEW  Result Date: 03/09/2021 CLINICAL DATA:  Increasing shortness of breath, wheezing, known rectal cancer EXAM: PORTABLE CHEST 1 VIEW COMPARISON:  03/07/2021 FINDINGS: Single frontal view of the chest demonstrates stable right chest wall port. Cardiac silhouette is stable. No change in bilateral veiling opacities consistent with consolidation and effusions. No pneumothorax. IMPRESSION: 1. Stable bibasilar consolidation and effusions. Electronically Signed   By: Randa Ngo M.D.   On: 03/09/2021 17:36   VAS [place] LOWER EXTREMITY VENOUS (DVT)  Result Date: 03/09/2021  Lower Venous DVT Study Patient Name:  Shane [name]  Date of Exam:   03/09/2021 Medical Rec #: [id number]      Accession #:    [id number] Date of Birth: 26-Feb-1951     Patient Gender: M Patient Age:   69Y Exam Location:  [facility] Procedure:      VAS [place] LOWER EXTREMITY VENOUS (DVT) Referring Phys: [id number] Jonnie Finner --------------------------------------------------------------------------------  Indications: Edema.  Comparison Study: no prior Performing Technologist: Abram Sander RVS  Examination Guidelines: A complete evaluation includes B-mode imaging,  spectral Doppler, color Doppler, and power Doppler as needed of all accessible portions of each vessel. Bilateral testing is considered an integral part of a complete examination. Limited examinations for reoccurring indications may be performed as noted. The reflux portion of the exam is performed with the patient in reverse Trendelenburg.  +---------+---------------+---------+-----------+----------+--------------+ RIGHT    CompressibilityPhasicitySpontaneityPropertiesThrombus Aging +---------+---------------+---------+-----------+----------+--------------+ CFV      Full           Yes      Yes                                 +---------+---------------+---------+-----------+----------+--------------+ SFJ      Full                                                        +---------+---------------+---------+-----------+----------+--------------+ FV Prox  Full                                                        +---------+---------------+---------+-----------+----------+--------------+  FV Mid   Full                                                        +---------+---------------+---------+-----------+----------+--------------+ FV DistalFull                                                        +---------+---------------+---------+-----------+----------+--------------+ PFV      Full                                                        +---------+---------------+---------+-----------+----------+--------------+ POP      Full           Yes      Yes                                 +---------+---------------+---------+-----------+----------+--------------+ PTV      Full                                                        +---------+---------------+---------+-----------+----------+--------------+ PERO     Full                                                        +---------+---------------+---------+-----------+----------+--------------+    +---------+---------------+---------+-----------+----------+--------------+ LEFT     CompressibilityPhasicitySpontaneityPropertiesThrombus Aging +---------+---------------+---------+-----------+----------+--------------+ CFV      Full           Yes      Yes                                 +---------+---------------+---------+-----------+----------+--------------+ SFJ      Full                                                        +---------+---------------+---------+-----------+----------+--------------+ FV Prox  Full                                                        +---------+---------------+---------+-----------+----------+--------------+ FV Mid   Full                                                        +---------+---------------+---------+-----------+----------+--------------+   FV DistalFull                                                        +---------+---------------+---------+-----------+----------+--------------+ PFV      Full                                                        +---------+---------------+---------+-----------+----------+--------------+ POP      Full           Yes      Yes                                 +---------+---------------+---------+-----------+----------+--------------+ PTV      Full                                                        +---------+---------------+---------+-----------+----------+--------------+ PERO     Full                                                        +---------+---------------+---------+-----------+----------+--------------+     Summary: BILATERAL: - No evidence of deep vein thrombosis seen in the lower extremities, bilaterally. - No evidence of superficial venous thrombosis in the lower extremities, bilaterally. -No evidence of popliteal cyst, bilaterally.   *See table(s) above for measurements and observations. Electronically signed by Harold Barban MD on 03/09/2021 at  4:26:21 PM.    Final    [place] EKG SITE RITE  Result Date: 03/10/2021 If Site Rite image not attached, placement could not be confirmed due to current cardiac rhythm.   Medications / Allergies: per chart  Antibiotics: Anti-infectives (From admission, onward)   Start     Dose/Rate Route Frequency Ordered Stop   03/08/21 0030  piperacillin-tazobactam (ZOSYN) IVPB 3.375 g        3.375 g 12.5 mL/hr over 240 Minutes Intravenous Every 8 hours 03/08/21 0016 03/13/21 0029   03/05/21 2200  cefoTEtan (CEFOTAN) 2 g in sodium chloride 0.9 % 100 mL IVPB        2 g 200 mL/hr over 30 Minutes Intravenous Every 12 hours 03/05/21 1841 03/05/21 2258   03/05/21 0730  cefoTEtan (CEFOTAN) 2 g in sodium chloride 0.9 % 100 mL IVPB        2 g 200 mL/hr over 30 Minutes Intravenous On call to O.R. 03/05/21 [id number] 03/05/21 0926        Note: Portions of this report may have been transcribed using voice recognition software. Every effort was made to ensure accuracy; however, inadvertent computerized transcription errors may be present.   Any transcriptional errors that result from this process are unintentional.    Adin Hector, MD, FACS, MASCRS  Esophageal, Gastrointestinal & Colorectal Surgery Robotic and Minimally Invasive Surgery [facility] [address], [place], [place] [address] [phone] Fax [phone] Main/Paging  CONTACT INFORMATION: Weekday (9AM-5PM) concerns: Call [facility] main office at [phone] Weeknight (5PM-9AM) or Weekend/Holiday concerns: Check [email or web address] for General Surgery [facility] coverage (Please, do not use SecureChat as it is not reliable communication to operating surgeons for immediate patient care)      03/11/2021  7:38 AM

## 2021-03-11 NOTE — Procedures (Signed)
Pre procedural Dx: Postop fluid collection Post procedural Dx: Same  Technically successful CT guided placed of a 10 Fr drainage catheter placement into the left upper abdominal quadrant yielding 160 cc of purulent appearing fluid.    A representative aspirated sample was capped and sent to the laboratory for analysis.    EBL: None Complications: None immediate  Ronny Bacon, MD Pager #: [phone]

## 2021-03-11 NOTE — Progress Notes (Signed)
PHARMACY - TOTAL PARENTERAL NUTRITION CONSULT NOTE   Indication: Prolonged ileus  Patient Measurements: Weight: 76.2 kg (167 lb 15.9 oz)   Body mass index is 24.1 kg/m.   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 K 3.2, low and phos 1.7, low.  Renal: SCr 1.48 improved, BUN 58 elevated slightly improved Hepatic: AST/ALT WNL stable, tbili 1.2, alkphos 46 Albumin/prealbumin: alb 2.2 (5/3), prealb <5 (5/3) Intake / Output; MIVF: I/O last 24hr - 2649mL/3905mL (net -1289mL), NS at 31ml/hr GI Imaging: GI Surgeries / Procedures:  4/27:  xi robotic assisted lower anterior resection with end colostomy on 4/27  Central access: 5/2 TPN start date: 5/2  Nutritional Goals (per RD recommendation on 5/3): 2200-2400 kcal/day, protein 90-115 g/day, and >2L/day  Goal TPN rate pending RD assessment is 90 mL/hr (provides 114 g of protein and 2199 kcals per day)  Current Nutrition:  NPO  Plan:   This AM, give 50mEq KCL IV and 32mMol IV of Kphos   Increase TPN rate from 56ml/hr to 65 mL/hr at 1800  Electrolytes in TPN: Na 85mEq/L (decr), K 30mEq/L, Ca 46mEq/L, Mg 46mEq/L, and Phos 23mmol/L. Cl:Ac 1:1  Add standard MVI and trace elements to TPN  Continue Sensitive q4h SSI and adjust as needed   Reduce MIVF from 75 m/hr to 35 mL/hr  Monitor TPN labs on Mon/Thurs and as needed   Adrian Saran, PharmD, BCPS Secure Chat if ?s 03/11/2021 10:33 AM

## 2021-03-11 NOTE — Plan of Care (Addendum)
Problem: Fluid Volume: Goal: Ability to achieve a balanced intake and output will improve Outcome: Progressing   Problem: Nutrition: Goal: Will attain and maintain optimal nutritional status will improve Outcome: Progressing   Problem: Clinical Measurements: Goal: Postoperative complications will be avoided or minimized Outcome: Progressing   Problem: Skin Integrity: Goal: Will show signs of wound healing Outcome: Progressing Goal: Risk for impaired skin integrity will decrease Outcome: Progressing   Problem: Clinical Measurements: Goal: Cardiovascular complication will be avoided Outcome: Progressing   Problem: Nutrition: Goal: Adequate nutrition will be maintained Outcome: Progressing   Problem: Elimination: Goal: Will not experience complications related to bowel motility Outcome: Progressing Goal: Will not experience complications related to urinary retention Outcome: Progressing   Problem: Pain Managment: Goal: General experience of comfort will improve Outcome: Progressing   Problem: Safety: Goal: Ability to remain free from injury will improve Outcome: Progressing   Problem: Skin Integrity: Goal: Risk for impaired skin integrity will decrease Outcome: Progressing

## 2021-03-11 NOTE — Progress Notes (Addendum)
NAME:  Shane [name], MRN:  [id number], DOB:  1951/01/21, LOS: 6 ADMISSION DATE:  03/05/2021, CONSULTATION DATE:  03/11/2021  REFERRING MD:  Milana Kidney, CHIEF COMPLAINT:  resp distress   History of Present Illness:  70 year old man presented with rectal bleeding, found to have rectal cancer, underwent anterior resection with end colostomy on 4/27.  Postop course complicated by ileus and AKI. NGT was attempted today but could not be placed. Creatinine has plateaued, given Lasix 5/1 Developed confusion overnight, pain medications were switched from Dilaudid to fentanyl with some improvement .  Venous Dopplers were negative for DVT 5/1 He developed increasing respiratory distress and tachypnea, hence PCCM consulted although oxygen requirements have not increased Febrile 100.7  Pertinent  Medical History    Significant Hospital Events: Including procedures, antibiotic start and stop dates in addition to other pertinent events   . Overnight 5/2, Hgb drop to 5.2.  2u PRBC ordered and started on Levophed for hypotension.  Interim History / Subjective:  Hgb drop to 5.2 .  Hypotensive overnight, now on low dose levo, BP adequate. Awaiting 2u PRBC transfusion this AM. Surgery planning for CT scan abd / pelv.  Objective   Blood pressure (!) 123/35, pulse (!) 117, temperature 97.7 F (36.5 C), temperature source Axillary, resp. rate (!) 28, weight 76.2 kg, SpO2 100 %.        Intake/Output Summary (Last 24 hours) at 03/11/2021 0717 Last data filed at 03/11/2021 0700 Gross per 24 hour  Intake 2524.56 ml  Output 3255 ml  Net -730.44 ml   Filed Weights   03/07/21 0500 03/09/21 0500 03/10/21 0500  Weight: 92.2 kg 83.8 kg 76.2 kg    Examination:  General: Chronically ill-appearing, elderly man, in NAD. HENT: [place]/AT.  No JVD, no icterus. NGT in place, coffee ground output in suction canister. Lungs: Normal effort.  Decreased breath sounds bilateral bases, otherwise clear. Cardiovascular: RRR,  no M/R/G. Abdomen: Distended soft, colostomy appears pink Extremities: No deformity, 1+ edema Neuro:  Nods head in response to questions.  MAE's.   Labs/imaging that I havepersonally reviewed  (right click and "Reselect all SmartList Selections" daily)  Chest x-ray shows bibasal infiltrates and effusions. X-ray abdomen does not show any evidence of bowel obstruction   Resolved Hospital Problem list     Assessment & Plan:   Anemia - has had melena and coffee ground output from NGT.  ? Gastritis vs other upper GI.  2u PRBC ordered, starting transfusion shortly. - Transfuse 2u PRBC as already ordered. - F/u repeat H/H. - D/c Lovenox. - Agree with PPI - pantoprazole 40mg  BID ordered by surgery.  - Surgery has ordered CT abd / pelv. - Might need GI consult if melena or coffee ground output persists, ? EGD.  Hypotension - presumed 2/2 above. - Continue low dose levophed for goal MAP > 65.  Anticipate this can come off once blood transfusion has completed.  Acute respiratory distress - this appeared to be related to ileus and bibasilar atelectasis.  Now improved. No reason to suspect PE - Likely other causes of elevated D-dimer in the situation including malignancy.  - Continue IS and push bronchial hygiene. - Keep NGT to LIS. - Mobilize as able. - Not a candidate for BiPAP currently.  Ileus. - Continue NGT to LIS for decompression. - Continue TPN.  AKI - creatinine has plateaued and he has decent urine output so expect this to turn around eventually. - Continue supportive care. - Avoid contrast and other nephrotoxins.  Hypokalemia. - 6 runs K already ordered. - D/c bicarb infusion, switch to LR for maintenance fluids. - Follow BMP.  Delirium - likely ICU delirium. - OK to use Haldol as QTC permits.  Rectal CA - s/p resection with end colostomy. - Per surgery. - F/u with oncology.  CC time: 30 min.   Montey Hora, [employer] [facility]  Medicine For pager details, please see AMION or use Epic chat  After 1900, please call [place] for cross coverage needs 03/11/2021, 7:17 AM

## 2021-03-11 NOTE — Progress Notes (Signed)
Per MD order, this RN backed out NG tube by 15 cm.  Pt tolerated fairly well.  NG tube now measuring 44cm externally.  Gastric contents emptying due low intermittent suction successfully.  Per MD no follow up x-ray for placement necessary.  RN will continue to carefully monitor.

## 2021-03-11 NOTE — Progress Notes (Signed)
Pt has had multiple instances of moderate amount of thick, tenacious secretions in back of mouth and throat.   RN had pt swish water, provided mouth care, and suctioned back of mouth to remove.  Pt tolerated fairly well each time.  RN will continue to carefully monitor.

## 2021-03-11 NOTE — Progress Notes (Signed)
Patient with abnormal ABG and Critical Lactic Acid of 2.3. Carson Myrtle MD notified and made aware. Sodium bicarb stopped per Carson Myrtle MD. No additional orders at this time. Will continue to monitor patient.

## 2021-03-11 NOTE — Progress Notes (Signed)
Pt consistently hypotensive despite 240ml fluid bolus. See flow sheet for vital signs. Argie Ramming MD notified and made aware. See chart for new orders. Will continue to monitor patient.

## 2021-03-12 ENCOUNTER — Inpatient Hospital Stay (HOSPITAL_COMMUNITY): Payer: Medicare Other

## 2021-03-12 DIAGNOSIS — R0603 Acute respiratory distress: Secondary | ICD-10-CM | POA: Diagnosis not present

## 2021-03-12 DIAGNOSIS — C20 Malignant neoplasm of rectum: Secondary | ICD-10-CM | POA: Diagnosis not present

## 2021-03-12 DIAGNOSIS — N179 Acute kidney failure, unspecified: Secondary | ICD-10-CM | POA: Diagnosis not present

## 2021-03-12 DIAGNOSIS — G9340 Encephalopathy, unspecified: Secondary | ICD-10-CM

## 2021-03-12 DIAGNOSIS — E876 Hypokalemia: Secondary | ICD-10-CM

## 2021-03-12 DIAGNOSIS — R410 Disorientation, unspecified: Secondary | ICD-10-CM | POA: Diagnosis not present

## 2021-03-12 LAB — HEMOGLOBIN AND HEMATOCRIT, BLOOD
HCT: 21.8 % — ABNORMAL LOW (ref 39.0–52.0)
Hemoglobin: 7.5 g/dL — ABNORMAL LOW (ref 13.0–17.0)

## 2021-03-12 LAB — GLUCOSE, CAPILLARY
Glucose-Capillary: 133 mg/dL — ABNORMAL HIGH (ref 70–99)
Glucose-Capillary: 119 mg/dL — ABNORMAL HIGH (ref 70–99)
Glucose-Capillary: 128 mg/dL — ABNORMAL HIGH (ref 70–99)
Glucose-Capillary: 128 mg/dL — ABNORMAL HIGH (ref 70–99)
Glucose-Capillary: 147 mg/dL — ABNORMAL HIGH (ref 70–99)

## 2021-03-12 LAB — CBC
HCT: 21.3 % — ABNORMAL LOW (ref 39.0–52.0)
HCT: 20.9 % — ABNORMAL LOW (ref 39.0–52.0)
Hemoglobin: 7.5 g/dL — ABNORMAL LOW (ref 13.0–17.0)
Hemoglobin: 7.3 g/dL — ABNORMAL LOW (ref 13.0–17.0)
MCH: 29.9 pg (ref 26.0–34.0)
MCH: 29.9 pg (ref 26.0–34.0)
MCHC: 35.2 g/dL (ref 30.0–36.0)
MCHC: 34.9 g/dL (ref 30.0–36.0)
MCV: 84.9 fL (ref 80.0–100.0)
MCV: 85.7 fL (ref 80.0–100.0)
Platelets: 170 10*3/uL (ref 150–400)
Platelets: 156 10*3/uL (ref 150–400)
RBC: 2.51 MIL/uL — ABNORMAL LOW (ref 4.22–5.81)
RBC: 2.44 MIL/uL — ABNORMAL LOW (ref 4.22–5.81)
RDW: 15.5 % (ref 11.5–15.5)
RDW: 15.6 % — ABNORMAL HIGH (ref 11.5–15.5)
WBC: 20.6 10*3/uL — ABNORMAL HIGH (ref 4.0–10.5)
WBC: 21.1 10*3/uL — ABNORMAL HIGH (ref 4.0–10.5)
nRBC: 8 % — ABNORMAL HIGH (ref 0.0–0.2)
nRBC: 7.5 % — ABNORMAL HIGH (ref 0.0–0.2)

## 2021-03-12 LAB — BASIC METABOLIC PANEL
Anion gap: 8 (ref 5–15)
Anion gap: 7 (ref 5–15)
BUN: 39 mg/dL — ABNORMAL HIGH (ref 8–23)
BUN: 40 mg/dL — ABNORMAL HIGH (ref 8–23)
CO2: 26 mmol/L (ref 22–32)
CO2: 24 mmol/L (ref 22–32)
Calcium: 8.1 mg/dL — ABNORMAL LOW (ref 8.9–10.3)
Calcium: 7.9 mg/dL — ABNORMAL LOW (ref 8.9–10.3)
Chloride: 114 mmol/L — ABNORMAL HIGH (ref 98–111)
Chloride: 118 mmol/L — ABNORMAL HIGH (ref 98–111)
Creatinine, Ser: 1.4 mg/dL — ABNORMAL HIGH (ref 0.61–1.24)
Creatinine, Ser: 1.21 mg/dL (ref 0.61–1.24)
GFR, Estimated: 54 mL/min — ABNORMAL LOW (ref >60)
GFR, Estimated: >60 mL/min (ref >60)
Glucose, Bld: 128 mg/dL — ABNORMAL HIGH (ref 70–99)
Glucose, Bld: 150 mg/dL — ABNORMAL HIGH (ref 70–99)
Potassium: 3.3 mmol/L — ABNORMAL LOW (ref 3.5–5.1)
Potassium: 3.5 mmol/L (ref 3.5–5.1)
Sodium: 148 mmol/L — ABNORMAL HIGH (ref 135–145)
Sodium: 149 mmol/L — ABNORMAL HIGH (ref 135–145)

## 2021-03-12 LAB — MAGNESIUM: Magnesium: 2.2 mg/dL (ref 1.7–2.4)

## 2021-03-12 LAB — PROCALCITONIN: Procalcitonin: 13.73 ng/mL

## 2021-03-12 LAB — PHOSPHORUS: Phosphorus: 3.2 mg/dL (ref 2.5–4.6)

## 2021-03-12 LAB — LACTIC ACID, PLASMA: Lactic Acid, Venous: 1.7 mmol/L (ref 0.5–1.9)

## 2021-03-12 MED ORDER — TRAVASOL 10 % IV SOLN
INTRAVENOUS | Status: AC
  Filled 2021-03-12: qty 1123.2

## 2021-03-12 MED ORDER — POTASSIUM CHLORIDE 10 MEQ/100ML IV SOLN
10.0000 meq | INTRAVENOUS | Status: AC
  Administered 2021-03-12 (×4): 10 meq via INTRAVENOUS
  Filled 2021-03-12 (×4): qty 100

## 2021-03-12 MED ORDER — FENTANYL CITRATE (PF) 100 MCG/2ML IJ SOLN
12.5000 ug | INTRAMUSCULAR | Status: DC | PRN
  Administered 2021-03-12 (×2): 25 ug via INTRAVENOUS
  Administered 2021-03-12: 12.5 ug via INTRAVENOUS
  Administered 2021-03-12 – 2021-03-17 (×22): 25 ug via INTRAVENOUS
  Filled 2021-03-12 (×25): qty 2

## 2021-03-12 MED ORDER — ACETAMINOPHEN 10 MG/ML IV SOLN
1000.0000 mg | Freq: Four times a day (QID) | INTRAVENOUS | Status: AC | PRN
  Administered 2021-03-12 – 2021-03-13 (×2): 1000 mg via INTRAVENOUS
  Filled 2021-03-12 (×2): qty 100

## 2021-03-12 MED ORDER — DEXTROSE 5 % IV SOLN: INTRAVENOUS | Status: AC

## 2021-03-12 MED ORDER — LACTATED RINGERS IV BOLUS
500.0000 mL | Freq: Once | INTRAVENOUS | Status: AC
  Administered 2021-03-12: 500 mL via INTRAVENOUS

## 2021-03-12 MED ORDER — DEXMEDETOMIDINE HCL IN NACL 200 MCG/50ML IV SOLN
0.4000 ug/kg/h | INTRAVENOUS | Status: DC
  Administered 2021-03-12 – 2021-03-13 (×6): 0.4 ug/kg/h via INTRAVENOUS
  Administered 2021-03-13: 0.5 ug/kg/h via INTRAVENOUS
  Administered 2021-03-14: 0.6 ug/kg/h via INTRAVENOUS
  Administered 2021-03-14: 0.5 ug/kg/h via INTRAVENOUS
  Administered 2021-03-14: 0.6 ug/kg/h via INTRAVENOUS
  Filled 2021-03-12 (×10): qty 50

## 2021-03-12 MED ORDER — SODIUM CHLORIDE 0.9% FLUSH
5.0000 mL | Freq: Three times a day (TID) | INTRAVENOUS | Status: DC
  Administered 2021-03-12 – 2021-03-29 (×38): 5 mL
  Administered 2021-03-29: 10 mL
  Administered 2021-03-30 – 2021-04-02 (×11): 5 mL
  Administered 2021-04-02: 30 mL
  Administered 2021-04-03 – 2021-04-11 (×24): 5 mL

## 2021-03-12 NOTE — Progress Notes (Signed)
Referring Physician(s): Johney Maine, Remo Lipps  Supervising Physician: Jacqulynn Cadet  Patient Status:  Shane [name]  Chief Complaint: Follow up LUQ fluid collection aspiration and drain placement 03/12/21 at bedside by Dr. Pascal Lux  Subjective:  Patient laying in bed with soft mittens on, doesn't answer questions for me but nods yes and is cooperative. Grandson at bedside.  Allergies: Nsaids  Medications: Prior to Admission medications   Medication Sig Start Date End Date Taking? Authorizing Provider  Diclofenac Sodium 2 % SOLN Place 2 g onto the skin 2 (two) times daily. Patient taking differently: Place 2 g onto the skin daily as needed (pain). 06/30/19  Yes Lyndal Pulley, DO  docusate sodium (COLACE) 100 MG capsule Take 100 mg by mouth daily as needed for mild constipation.    Yes [provider]  HYDROcodone-acetaminophen (NORCO/VICODIN) 5-325 MG tablet Take 1 tablet by mouth every 6 (six) hours as needed for moderate pain. 02/12/21  Yes Owens Shark, NP  hydrocortisone (ANUSOL-HC) 2.5 % rectal cream Place 1 application rectally 2 (two) times daily. Patient taking differently: Place 1 application rectally daily as needed for hemorrhoids or anal itching. 01/28/21  Yes Marrian Salvage, FNP  lidocaine-prilocaine (EMLA) cream Apply 1 application topically as needed. Patient taking differently: Apply 1 application topically as needed (prior to port use). 10/21/20  Yes Ladell Pier, MD  pravastatin (PRAVACHOL) 20 MG tablet TAKE 1 TABLET(20 MG) BY MOUTH DAILY Patient taking differently: Take 20 mg by mouth daily. 11/12/20  Yes Marrian Salvage, FNP  capecitabine (XELODA) 500 MG tablet TAKE 2000 MG (4 TABLETS) EVERY MORNING AND 1500 MG (3 TABLETS) EVERY EVENING FOR A TOTAL DAILY DOSE OF 3500 MG ON RADIATION DAYS ONLY. Patient not taking: No sig reported 11/04/20 11/04/21  Ladell Pier, MD  diphenoxylate-atropine (LOMOTIL) 2.5-0.025 MG tablet Take 1-2 tablets by mouth  3 (three) times daily as needed for diarrhea or loose stools (Maxiumum of 6/day). Patient not taking: No sig reported 12/25/20   Ladell Pier, MD  ondansetron (ZOFRAN) 8 MG tablet Take 1 tablet (8 mg total) by mouth every 8 (eight) hours as needed for nausea or vomiting. Start 72 hours after each IV chemotherapy treatment 07/30/20   Ladell Pier, MD  prochlorperazine (COMPAZINE) 10 MG tablet Take 1 tablet (10 mg total) by mouth every 6 (six) hours as needed for nausea. 07/30/20   Ladell Pier, MD     Vital Signs: BP (!) 146/29   Pulse (!) 102   Temp 98 F (36.7 C) (Oral)   Resp (!) 24   Wt 190 lb 0.6 oz (86.2 kg) Comment: Bed Scale with bilateral boots off and scd's and extra blankets off  SpO2 98%   BMI 27.27 kg/m   Physical Exam Vitals and nursing note reviewed.  Constitutional:      Appearance: He is ill-appearing.  HENT:     Head: Normocephalic.  Cardiovascular:     Rate and Rhythm: Tachycardia present.  Pulmonary:     Effort: Pulmonary effort is normal.  Abdominal:     General: There is no distension.     Palpations: Abdomen is soft.     Tenderness: There is no abdominal tenderness.     Comments: (+) LUQ drain to gravity with ~200 cc output - top 1/3 is serosanguineous with bottom 1/3 tan sediment. No issues with flushing. Insertion site unremarkable.  Skin:    General: Skin is warm and dry.  Neurological:  Mental Status: He is alert. Mental status is at baseline.     Imaging: DG Abd 1 View  Result Date: 03/09/2021 CLINICAL DATA:  NG tube placement EXAM: ABDOMEN - 1 VIEW COMPARISON:  None. FINDINGS: NG tube appears adequately positioned in the stomach. Paucity of bowel gas. IMPRESSION: NG tube adequately positioned in the stomach. Electronically Signed   By: Franki Cabot M.D.   On: 03/09/2021 18:33   CT ABDOMEN PELVIS W CONTRAST  Result Date: 03/11/2021 CLINICAL DATA:  70 year old history of rectal cancer and status post low anterior resection with end  colostomy. Postoperative fevers. EXAM: CT ABDOMEN AND PELVIS WITH CONTRAST TECHNIQUE: Multidetector CT imaging of the abdomen and pelvis was performed using the standard protocol following bolus administration of intravenous contrast. CONTRAST:  111mL OMNIPAQUE IOHEXOL 300 MG/ML  SOLN COMPARISON:  CT abdomen and pelvis 10/31/2020 FINDINGS: Lower chest: Bilateral pleural effusions. Pleural effusions are small to moderate in size but incompletely evaluated. Compressive atelectasis in both lower lobes. The distal esophagus is distended. The nasogastric tube is coiled in the stomach and the tip actually extends back into the distal esophagus. Hepatobiliary: Normal appearance of the liver. There is low-density collection along the posterior aspect of the right hepatic lobe which could be in the subcapsular space. This collection measures 5.2 x 4.1 x 7.5 cm. No gross abnormality to the gallbladder. No significant biliary dilatation. Pancreas: Unremarkable. No pancreatic ductal dilatation or surrounding inflammatory changes. Spleen: Normal appearance of the spleen. Adrenals/Urinary Tract: Normal appearance of the adrenal glands. Evidence for cysts in the right kidney. Negative for hydronephrosis. Urinary bladder is decompressed with a Foley catheter. Stomach/Bowel: Patient has a left colostomy with a Hartmann's pouch. There is a small amount of fluid in the residual rectum. Distal esophagus appears to be markedly distended but difficult to exclude wall thickening in the esophagus. Nasogastric tube is coiled in the stomach but the tip actually extends cephalad into the distal esophagus. Dilated fluid-filled loops of small bowel throughout the abdomen. There appears to be diffuse wall thickening involving the transverse and left colon. Possible bowel wall thickening at the cecum. In addition, there is probably wall thickening involving small bowel in the left upper abdomen. Vascular/Lymphatic: Atherosclerotic calcifications  in the abdominal aorta without aneurysm. No significant lymph node enlargement in the abdomen or pelvis. Reproductive: Limited evaluation of the prostate. Large amount of fluid in the scrotum bilaterally. This finding was present on the previous examination and most compatible with bilateral hydroceles. There is a small amount of gas near the right inguinal canal and not clear if this is within the canal or the subcutaneous tissues. Other: Small to moderate amount of fluid in the left upper abdomen which may represent ascites. Small amount of fluid in the left lower abdomen. There is a small amount of fluid in the pelvis surrounding the surgical drain. There is a right-sided surgical drain that extends into the pelvis and the tip is on the left side of the pelvis. Large amount of subcutaneous edema. Musculoskeletal: No acute bone abnormality. Scattered subcutaneous gas in the abdomen and pelvis. IMPRESSION: 1. Diffuse dilatation of small bowel and colon. In addition, there is evidence for colonic wall thickening and suspect there is small bowel wall thickening in the left upper abdomen. Findings raise concern for colitis and enteritis. There is likely an adynamic ileus. 2. Anasarca with bilateral pleural effusions, ascites and large amount of subcutaneous edema. 3. Scattered intra-abdominal free fluid/ascites. Largest component is in the left upper abdomen  and small amount of fluid in the pelvis surround the surgical drain. 4. Focal low-density fluid collection posterior to the right hepatic lobe likely in the subcapsular space. This low-density collection measures up to 7.5 cm. This could represent loculated ascites but indeterminate. 5. Distended esophagus. Nasogastric tube is coiled in the stomach but the tip is actually in the distal esophagus. Consider repositioning of the nasogastric tube. 6. Bilateral hydroceles. These results were called by telephone at the time of interpretation on 03/11/2021 at 4:11 pm to  provider [facility] , who verbally acknowledged these results. Electronically Signed   By: Markus Daft M.D.   On: 03/11/2021 16:17   US RENAL  Result Date: 03/09/2021 CLINICAL DATA:  Acute kidney injury EXAM: RENAL / URINARY TRACT ULTRASOUND COMPLETE COMPARISON:  None. FINDINGS: Right Kidney: Renal measurements: 11.5 x 5.3 x 5.6 cm = volume: 176 mL. Echogenicity within normal limits. RIGHT renal cyst measures 2.4 cm. No suspicious mass or hydronephrosis. Left Kidney: Renal measurements: 11.5 x 6.5 x 5 cm = volume: 195 mL. Echogenicity within normal limits. No mass or hydronephrosis visualized. Bladder: Decompressed by Foley catheter. Other: None. IMPRESSION: No acute or significant findings.  No hydronephrosis. Electronically Signed   By: Franki Cabot M.D.   On: 03/09/2021 18:35   IR US Guide Bx Asp/Drain  Result Date: 03/12/2021 INDICATION: History of rectal cancer post LAR with end colostomy, now with postoperative abdominal distension and fevers with indeterminate fluid collection within the left upper abdominal quadrant. Please perform ultrasound-guided aspiration and/or drainage catheter placement for infection source control purposes. EXAM: ULTRASOUND-GUIDED PERCUTANEOUS DRAINAGE CATHETER PLACEMENT COMPARISON:  CT abdomen pelvis-03/11/2021 MEDICATIONS: The patient is currently admitted to the hospital and receiving intravenous antibiotics. The antibiotics were administered within an appropriate time frame prior to the initiation of the procedure. ANESTHESIA/SEDATION: None CONTRAST:  None COMPLICATIONS: None immediate. PROCEDURE: Informed written consent was obtained from the patient's wife after a discussion of the risks, benefits and alternatives to treatment. The patient was placed supine on his ICU hospital bed and preprocedural sonographic evaluation was performed of the left upper abdominal quadrant demonstrating small to moderate-sized moderately complex fluid collection compatible with the  findings seen on preceding abdominal CT image 26, series 2. the procedure was planned. A timeout was performed prior to the initiation of the procedure. The skin overlying the left upper abdominal quadrant was prepped and draped in the usual sterile fashion. The overlying soft tissues were anesthetized with 1% lidocaine with epinephrine an 18 gauge trocar needle was utilized to access the fluid collection. A small amount of purulent fluid was aspirated the decision was made to proceed with definitive percutaneous drainage catheter placement As such, a short Amplatz wire was coiled within the collection. Appropriate position was confirmed with sonographic evaluation. Multiple ultrasound images were saved procedural documentation purposes. Next, the track was dilated ultimately allowing placement of a 12 [place] all-purpose drainage catheter. Appropriate positioning was again confirmed with sonographic evaluation and image documentation Next, approximately 160 cc of purulent fluid was aspirated. The tube was connected to a drainage bag and sutured in place. A dressing was placed. The patient tolerated the procedure well without immediate post procedural complication. IMPRESSION: Successful CT guided placement of a 65 French all purpose drain catheter into the complex fluid collection with the left upper abdominal quadrant with aspiration of 160 mL of purulent fluid. Samples were sent to the laboratory as requested by the ordering clinical team. Electronically Signed   By: Eldridge Abrahams.D.  On: 03/12/2021 07:52   DG CHEST PORT 1 VIEW  Result Date: 03/09/2021 CLINICAL DATA:  Increasing shortness of breath, wheezing, known rectal cancer EXAM: PORTABLE CHEST 1 VIEW COMPARISON:  03/07/2021 FINDINGS: Single frontal view of the chest demonstrates stable right chest wall port. Cardiac silhouette is stable. No change in bilateral veiling opacities consistent with consolidation and effusions. No pneumothorax. IMPRESSION: 1.  Stable bibasilar consolidation and effusions. Electronically Signed   By: Randa Ngo M.D.   On: 03/09/2021 17:36   DG Abd Portable 1V  Result Date: 03/08/2021 CLINICAL DATA:  Ileus. EXAM: PORTABLE ABDOMEN - 1 VIEW COMPARISON:  None. FINDINGS: There is a paucity of bowel gas limiting evaluation. A surgical drain terminates in the pelvis. Bilateral pleural effusions with underlying atelectasis. No other abnormalities. IMPRESSION: 1. There is a surgical drain terminating in the pelvis. 2. The lack of significant bowel gas limits evaluation for ileus or obstruction. No dilated loops are seen. 3. Small bilateral pleural effusions with underlying atelectasis. Electronically Signed   By: Dorise Bullion III M.D   On: 03/08/2021 14:55   VAS [place] LOWER EXTREMITY VENOUS (DVT)  Result Date: 03/09/2021  Lower Venous DVT Study Patient Name:  Shane [name]  Date of Exam:   03/09/2021 Medical Rec #: [id number]      Accession #:    [id number] Date of Birth: October 17, 1951     Patient Gender: M Patient Age:   69Y Exam Location:  [facility] Procedure:      VAS [place] LOWER EXTREMITY VENOUS (DVT) Referring Phys: [id number] Jonnie Finner --------------------------------------------------------------------------------  Indications: Edema.  Comparison Study: no prior Performing Technologist: Abram Sander RVS  Examination Guidelines: A complete evaluation includes B-mode imaging, spectral Doppler, color Doppler, and power Doppler as needed of all accessible portions of each vessel. Bilateral testing is considered an integral part of a complete examination. Limited examinations for reoccurring indications may be performed as noted. The reflux portion of the exam is performed with the patient in reverse Trendelenburg.  +---------+---------------+---------+-----------+----------+--------------+ RIGHT    CompressibilityPhasicitySpontaneityPropertiesThrombus Aging  +---------+---------------+---------+-----------+----------+--------------+ CFV      Full           Yes      Yes                                 +---------+---------------+---------+-----------+----------+--------------+ SFJ      Full                                                        +---------+---------------+---------+-----------+----------+--------------+ FV Prox  Full                                                        +---------+---------------+---------+-----------+----------+--------------+ FV Mid   Full                                                        +---------+---------------+---------+-----------+----------+--------------+ FV DistalFull                                                        +---------+---------------+---------+-----------+----------+--------------+  PFV      Full                                                        +---------+---------------+---------+-----------+----------+--------------+ POP      Full           Yes      Yes                                 +---------+---------------+---------+-----------+----------+--------------+ PTV      Full                                                        +---------+---------------+---------+-----------+----------+--------------+ PERO     Full                                                        +---------+---------------+---------+-----------+----------+--------------+   +---------+---------------+---------+-----------+----------+--------------+ LEFT     CompressibilityPhasicitySpontaneityPropertiesThrombus Aging +---------+---------------+---------+-----------+----------+--------------+ CFV      Full           Yes      Yes                                 +---------+---------------+---------+-----------+----------+--------------+ SFJ      Full                                                         +---------+---------------+---------+-----------+----------+--------------+ FV Prox  Full                                                        +---------+---------------+---------+-----------+----------+--------------+ FV Mid   Full                                                        +---------+---------------+---------+-----------+----------+--------------+ FV DistalFull                                                        +---------+---------------+---------+-----------+----------+--------------+ PFV      Full                                                        +---------+---------------+---------+-----------+----------+--------------+  POP      Full           Yes      Yes                                 +---------+---------------+---------+-----------+----------+--------------+ PTV      Full                                                        +---------+---------------+---------+-----------+----------+--------------+ PERO     Full                                                        +---------+---------------+---------+-----------+----------+--------------+     Summary: BILATERAL: - No evidence of deep vein thrombosis seen in the lower extremities, bilaterally. - No evidence of superficial venous thrombosis in the lower extremities, bilaterally. -No evidence of popliteal cyst, bilaterally.   *See table(s) above for measurements and observations. Electronically signed by Harold Barban MD on 03/09/2021 at 4:26:21 PM.    Final    [place] EKG SITE RITE  Result Date: 03/10/2021 If Site Rite image not attached, placement could not be confirmed due to current cardiac rhythm.   Labs:  CBC: Recent Labs    03/10/21 0233 03/11/21 0231 03/11/21 1510 03/11/21 1738 03/12/21 0539 03/12/21 0942  WBC 7.2 11.4*  --   --  20.6* 21.1*  HGB 7.9* 5.4* 7.5* 7.3* 7.5*  7.5* 7.3*  HCT 21.5* 15.1* 20.7* 20.3* 21.3*  21.8* 20.9*  PLT 123* 136*  --   --  170 156     COAGS: No results for input(s): INR, APTT in the last 8760 hours.  BMP: Recent Labs    07/04/20 0845 07/15/20 0048 08/13/20 0906 03/09/21 0211 03/10/21 0233 03/11/21 0231 03/11/21 1738 03/12/21 0539  NA 138 136   < > 141 144 147*  --  148*  K 3.8 3.7   < > 3.8 3.2* 3.2* 5.0 3.3*  CL 108 106   < > 112* 110 111  --  114*  CO2 25 21*   < > 18* 23 27  --  26  GLUCOSE 105* 100*   < > 90 105* 140*  --  128*  BUN 12 21   < > 60* 61* 58*  --  39*  CALCIUM 9.9 9.2   < > 8.7* 8.6* 8.1*  --  8.1*  CREATININE 0.96 0.94   < > 2.93* 2.10* 1.48*  --  1.40*  GFRNONAA >60 >60   < > 22* 33* 50*  --  54*  GFRAA >60 >60  --   --   --   --   --   --    < > = values in this interval not displayed.    LIVER FUNCTION TESTS: Recent Labs    01/10/21 0934 03/08/21 0520 03/09/21 0211 03/11/21 0231  BILITOT 0.7 1.1 0.9 1.2  AST 21 41 35 30  ALT 12 30 30 22   ALKPHOS 102 62 41 46  PROT 6.8 5.4* 5.8* 5.1*  ALBUMIN 3.2* 2.5* 2.7* 2.2*    Assessment  and Plan:  70 y/o M admitted for lower anterior resection for rectal cancer who subsequently developed shock and intra-abdominal fluid collection. He is s/p LUQ fluid collectin aspiration/drain placement 5/3 at bedside with Dr. Pascal Lux seen today for follow up. Patient lethargic on exam but follows commands and nods yes appropriately.   Drain to gravity with ~200 cc output, top 2/3 is serosanguinous and the bottom 1/3 tan sediment (purulence?). Per I/O 325 cc so far. No issues with flushing, insertion site unremarkable. Culture pending, Tmax 101.2, tachycardic and mildly hypotensive. WBC increased to 21.1 (11.4 >> 20.6), hgb stable at 7.3.    Continue TID flushes with 5 cc NS, record output Qshift, dressing changes QD or PRN if soiled, call IR if difficulty flushing or sudden decrease in output.   Plan for repeat CT/possible injection once output <10 mL/QD (excluding flush).  IR will continue to follow - please call with questions or  concerns.  Electronically Signed: Joaquim Nam, PA-C 03/12/2021, 10:51 AM   I spent a total of 15 Minutes at the the patient's bedside AND on the patient's hospital floor or unit, greater than 50% of which was counseling/coordinating care for LUQ drain follow up.

## 2021-03-12 NOTE — Progress Notes (Addendum)
Pt had x1 bloody emesis, RR 30's-40's. Elink notified of respiratory status. Surgery MD on call notified about bloody emesis, tachypnea and restarting of levophed, no new orders.

## 2021-03-12 NOTE — Progress Notes (Addendum)
PROGRESS NOTE    Shane [name]  [id number] DOB: 1951/06/18 DOA: 03/05/2021 PCP: Marrian Salvage, FNP   No chief complaint on file.  Brief Narrative: Shane [name] Shane [name] 70 y.o.malewith medical history significant of rectal CA (possibly stage IV given pulm nodules) presented with rectal bleeding. Admitted to surgical team. Evaluation -- including imaging -- showed Shane [name] large rectal mass. C-scope showed that this mass was in communication with an adjacent loop of sigmoid colon. Further imaging showed communication with right seminal vesicle and pelvic side wall. This prompted surgery to take him for resection of bowel w/ placement of Shane Cortright permanent colostomy. He successfully completed that procedure on 4/27.  Since that time he has developed ileus, delirium, and had an intermittent pressor requirement.  His creatinine has risen.  TRH was consulted to assist with these issues.  Critical care currently following as well.  Surgery is the primary service.  PCCM following today, they're going to continue to follow him given his tenuous status with sepsis, intermittent pressor requirement, anemia, and delirium.  Will discuss whether TRH needs to continue to follow.   Discussed with Dr. Chase Caller, critical care medicine continuing to follow.  We will sign off for now.  Please let us know when we can be of further assistance.  Assessment & Plan:   Principal Problem:   Rectal carcinoma ([place]) Active Problems:   Degenerative cervical disc   Abnormality of gait   Iron deficiency anemia   Port-Jin Capote-Cath in place   AKI (acute kidney injury) ([place])   Hypercholesteremia   Pulmonary nodules   Hypoxia   Phimosis of penis s/p dilitation 03/05/2021   Delirium   Tachypnea   Acute respiratory distress   Ileus ([facility])   Positive D dimer   Anemia   Hypotension   Malnutrition of moderate degree   Hypokalemia  Septic Shock Abdominal fluid Collection Leukocytosis with fevers, tachypnea, tachycardia Low  dose pressor requirement - wean per PCCM recs (MAP >65) Follow CXR, abd plain films Follow culture from CT guided drain placement (5/3) with 160 cc purulent fluid  Urine cx and blood cx from 5/1 NG Continue zosyn and eraxis.  Adjust per cultures.   Acute Blood Loss Anemia Hemorrhagic Shock S/p 2 units pRBC ? Melena/coffee ground output from NGT Continue PPI BID Consider GI c/s if persistent Hb 13.1 on 4/28 Hb 5.4 on 5/3 Stable today, continue to follow Transfuse for <7 Lovenox on hold, with orders to resume 5/5 AM  Acute Respiratory Distress  Tachypnea 2/2 above, no oxygen requirement Follow plain films  Rectal cancer - s/p radiation/Xeloda 01/03/21 - has persistent lung nodules seen on CT scans; concern would be mets? - now s/p colostomy due to obstructing mass ~23 cm from anal verge - CT abdomen pelvis 5/3 with concern for colitis/enteritis, adynamic ileus, anasarca with bilateral pleural effusions, ascites, subcutaneous edema, scattered intraabdominal free fluid/ascites, low density fluid collection posterior to R hepatic lobe in subcapsular spaces, distended esophagus, NG tube coiled in stomach, bilateral hydroceles (see report) - TPN per surgery - full aggressive care continues   AKI Non anion gap metabolic acidosis Hypernatremia - CT yesterday without hydro - baseline creatinine <1 - peaked at 2.97 - gradually improving today - 3.4 L out in last 24 hrs -> possibly diuretic phase of ATN - s/p LR bolus.  D5 given hypernatremia x 10 hrs. - continue to monitor  Anasarca - hypoalbuminemic - s/p lasix last night - will continue to monitor for now, follow CXR as noted  above  Delirium - not unexpected given hospitalization and underlying frail state - QTc 411 on 03/06/21 EKG - use haldol only for agitation if needed -Limit sedating agents given his severe lethargy at this time - re-orient as needed otherwise supportive care - delirium precautions  DVT  prophylaxis: SCD Code Status: full  Family Communication: per surgery, they discussed with daughter Disposition:    Consultants:   Surgery is primary  PCCM and [place] consulting  Procedures:  5/4 IMPRESSION: Successful CT guided placement of Shane [name] into the complex fluid collection with the left upper abdominal quadrant with aspiration of 160 mL of purulent fluid. Samples were sent to the laboratory as requested by the ordering clinical team.  4/27 XI ROBOTIC ASSISTED LOWER ANTERIOR RESECTION WITH MOBILIZATION OF THE SPLENIC FLEXURE  AND END COLOSTOMY  4/27 Examination under anesthesia; spreading of phimosis with hemostat; insertion of [name]  Antimicrobials:  Anti-infectives (From admission, onward)   Start     Dose/Rate Route Frequency Ordered Stop   03/12/21 1000  anidulafungin (ERAXIS) 100 mg in sodium chloride 0.9 % 100 mL IVPB       "Followed by" Linked Group Details   100 mg 78 mL/hr over 100 Minutes Intravenous Every 24 hours 03/11/21 0941     03/11/21 1030  anidulafungin (ERAXIS) 100 mg in sodium chloride 0.9 % 100 mL IVPB  Status:  Discontinued        100 mg 78 mL/hr over 100 Minutes Intravenous Every 24 hours 03/11/21 0937 03/11/21 0941   03/11/21 1030  anidulafungin (ERAXIS) 200 mg in sodium chloride 0.9 % 200 mL IVPB       "Followed by" Linked Group Details   200 mg 78 mL/hr over 200 Minutes Intravenous  Once 03/11/21 0941 03/11/21 1540   03/08/21 0030  piperacillin-tazobactam (ZOSYN) IVPB 3.375 g        3.375 g 12.5 mL/hr over 240 Minutes Intravenous Every 8 hours 03/08/21 0016 03/13/21 0029   03/05/21 2200  cefoTEtan (CEFOTAN) 2 g in sodium chloride 0.9 % 100 mL IVPB        2 g 200 mL/hr over 30 Minutes Intravenous Every 12 hours 03/05/21 1841 03/05/21 2258   03/05/21 0730  cefoTEtan (CEFOTAN) 2 g in sodium chloride 0.9 % 100 mL IVPB        2 g 200 mL/hr over 30 Minutes Intravenous On call to O.R. 03/05/21 0729 03/05/21  0926     Subjective: Confused, but alert C/o abdominal discomfort  Objective: Vitals:   03/12/21 0700 03/12/21 0800 03/12/21 0814 03/12/21 0850  BP: (!) 118/33 140/90    Pulse:    (!) 104  Resp: (!) 27 (!) 24  (!) 30  Temp:   98 F (36.7 C)   TempSrc:   Oral   SpO2: 100% 98%  97%  Weight:        Intake/Output Summary (Last 24 hours) at 03/12/2021 0937 Last data filed at 03/12/2021 0838 Gross per 24 hour  Intake 4792.95 ml  Output 8115 ml  Net -3322.05 ml   Filed Weights   03/09/21 0500 03/10/21 0500 03/12/21 0446  Weight: 83.8 kg 76.2 kg 86.2 kg    Examination:  General exam: Appears calm and comfortable  Respiratory system: coarse breath sounds bilaterally, mild tachypnea Cardiovascular system: S1 & S2 heard, RRR.  Gastrointestinal system: surgical dressings, distended, tender to palpation, colostomy with brown stool Central nervous system: Alert, but disoriented. No focal neurological deficits. Extremities: no  LEE, dependent edema, scrotal edmea   Data Reviewed: I have personally reviewed following labs and imaging studies  CBC: Recent Labs  Lab 03/08/21 0520 03/09/21 0211 03/10/21 0233 03/11/21 0231 03/11/21 1510 03/11/21 1738 03/12/21 0539  WBC 6.8 6.1 7.2 11.4*  --   --  20.6*  NEUTROABS  --   --   --  9.3*  --   --   --   HGB 10.4* 8.6* 7.9* 5.4* 7.5* 7.3* 7.5*  7.5*  HCT 29.9* 23.7* 21.5* 15.1* 20.7* 20.3* 21.3*  21.8*  MCV 85.9 82.9 81.4 83.0  --   --  84.9  PLT 137* 114* 123* 136*  --   --  035    Basic Metabolic Panel: Recent Labs  Lab 03/08/21 0520 03/09/21 0211 03/10/21 0233 03/11/21 0231 03/11/21 1738 03/12/21 0539  NA 137 141 144 147*  --  148*  K 4.2 3.8 3.2* 3.2* 5.0 3.3*  CL 111 112* 110 111  --  114*  CO2 17* 18* 23 27  --  26  GLUCOSE 99 90 105* 140*  --  128*  BUN 49* 60* 61* 58*  --  39*  CREATININE 2.97* 2.93* 2.10* 1.48*  --  1.40*  CALCIUM 8.3* 8.7* 8.6* 8.1*  --  8.1*  MG 1.8 1.8 2.2 2.2  --  2.2  PHOS 3.9  --   --   1.7*  --  3.2    GFR: Estimated Creatinine Clearance: 50 mL/min (Nadya Hopwood) (by C-G formula based on SCr of 1.4 mg/dL (H)).  Liver Function Tests: Recent Labs  Lab 03/08/21 0520 03/09/21 0211 03/11/21 0231  AST 41 35 30  ALT 30 30 22   ALKPHOS 62 41 46  BILITOT 1.1 0.9 1.2  PROT 5.4* 5.8* 5.1*  ALBUMIN 2.5* 2.7* 2.2*    CBG: Recent Labs  Lab 03/11/21 1551 03/11/21 1938 03/11/21 2346 03/12/21 0321 03/12/21 0729  GLUCAP 123* 130* 131* 133* 119*     Recent Results (from the past 240 hour(s))  SARS CORONAVIRUS 2 (TAT 6-24 HRS) Nasopharyngeal Nasopharyngeal Swab     Status: None   Collection Time: 03/03/21 11:13 AM   Specimen: Nasopharyngeal Swab  Result Value Ref Range Status   SARS Coronavirus 2 NEGATIVE NEGATIVE Final    Comment: (NOTE) SARS-CoV-2 target nucleic acids are NOT DETECTED.  The SARS-CoV-2 RNA is generally detectable in upper and lower respiratory specimens during the acute phase of infection. Negative results do not preclude SARS-CoV-2 infection, do not rule out co-infections with other pathogens, and should not be used as the sole basis for treatment or other patient management decisions. Negative results must be combined with clinical observations, patient history, and epidemiological information. The expected result is Negative.  Fact Sheet for Patients: SugarRoll.be  Fact Sheet for Healthcare Providers: https://www.woods-mathews.com/  This test is not yet approved or cleared by the [place] FDA and  has been authorized for detection and/or diagnosis of SARS-CoV-2 by FDA under an Emergency Use Authorization (EUA). This EUA will remain  in effect (meaning this test can be used) for the duration of the COVID-19 declaration under Se ction 564(b)(1) of the Act, 21 U.S.C. section 360bbb-3(b)(1), unless the authorization is terminated or revoked sooner.  Performed at [name], [place] [address],  [place], [place] [address]   MRSA PCR Screening     Status: None   Collection Time: 03/07/21  4:00 AM   Specimen: Nasopharyngeal  Result Value Ref Range Status   MRSA by  PCR NEGATIVE NEGATIVE Final    Comment:        The GeneXpert MRSA Assay (FDA approved for NASAL specimens only), is one component of Burnadette Baskett comprehensive MRSA colonization surveillance program. It is not intended to diagnose MRSA infection nor to guide or monitor treatment for MRSA infections. Performed at [facility], [place] [address], [place], [place] [address]   Culture, blood (routine x 2)     Status: None (Preliminary result)   Collection Time: 03/09/21  6:24 PM   Specimen: BLOOD  Result Value Ref Range Status   Specimen Description   Final    BLOOD BLOOD RIGHT HAND Performed at [facility] [address] [place], [place] [address]    Special Requests   Final    BOTTLES DRAWN AEROBIC ONLY Blood Culture adequate volume Performed at [facility] [address] [place], [place] [address]    Culture   Final    NO GROWTH 3 DAYS Performed at [name], [place] [address] [place], [place] [address]    Report Status PENDING  Incomplete  Culture, blood (routine x 2)     Status: None (Preliminary result)   Collection Time: 03/09/21  6:24 PM   Specimen: BLOOD  Result Value Ref Range Status   Specimen Description   Final    BLOOD RIGHT ANTECUBITAL Performed at [facility] [address] [place], [place] [address]    Special Requests   Final    BOTTLES DRAWN AEROBIC ONLY Blood Culture adequate volume Performed at [facility] [address], [place], [place] [address]    Culture   Final    NO GROWTH 3 DAYS Performed at [name], [place] [address] [place], [place] [address]    Report Status PENDING  Incomplete  Culture, Urine     Status: None   Collection Time: 03/09/21  6:36 PM   Specimen: Urine,  Random  Result Value Ref Range Status   Specimen Description   Final    URINE, RANDOM Performed at [facility] [address], [place], [place] [address]    Special Requests NONE  Final   Culture   Final    NO GROWTH Performed at [name], [place] [address], [place], [place] [address]    Report Status 03/11/2021 FINAL  Final  Aerobic/Anaerobic Culture (surgical/deep wound)     Status: None (Preliminary result)   Collection Time: 03/11/21  4:52 PM   Specimen: Wound; Abscess  Result Value Ref Range Status   Specimen Description   Final    WOUND Performed at [facility] [address], [place], [place] [address]    Special Requests SYRINGE  Final   Gram Stain   Final    ABUNDANT WBC PRESENT, PREDOMINANTLY PMN RARE GRAM POSITIVE COCCI Performed at [name], [place] [address] [place],  [address]    Culture PENDING  Incomplete   Report Status PENDING  Incomplete         Radiology Studies: CT ABDOMEN PELVIS W CONTRAST  Result Date: 03/11/2021 CLINICAL DATA:  70 year old history of rectal cancer and status post low anterior resection with end colostomy. Postoperative fevers. EXAM: CT ABDOMEN AND PELVIS WITH CONTRAST TECHNIQUE: Multidetector CT imaging of the abdomen and pelvis was performed using the standard protocol following bolus administration of intravenous contrast. CONTRAST:  130mL OMNIPAQUE IOHEXOL 300 MG/ML  SOLN COMPARISON:  CT  abdomen and pelvis 10/31/2020 FINDINGS: Lower chest: Bilateral pleural effusions. Pleural effusions are small to moderate in size but incompletely evaluated. Compressive atelectasis in both lower lobes. The distal esophagus is distended. The nasogastric tube is coiled in the stomach and the tip actually extends back into the distal esophagus. Hepatobiliary: Normal appearance of the liver. There is low-density collection along the posterior aspect of the right hepatic lobe which could be in  the subcapsular space. This collection measures 5.2 x 4.1 x 7.5 cm. No gross abnormality to the gallbladder. No significant biliary dilatation. Pancreas: Unremarkable. No pancreatic ductal dilatation or surrounding inflammatory changes. Spleen: Normal appearance of the spleen. Adrenals/Urinary Tract: Normal appearance of the adrenal glands. Evidence for cysts in the right kidney. Negative for hydronephrosis. Urinary bladder is decompressed with Shane [name] Foley [name]. Stomach/Bowel: Patient has Shalisha Clausing left colostomy with Shane [name]'s pouch. There is Shane [name] small amount of fluid in the residual rectum. Distal esophagus appears to be markedly distended but difficult to exclude wall thickening in the esophagus. Nasogastric tube is coiled in the stomach but the tip actually extends cephalad into the distal esophagus. Dilated fluid-filled loops of small bowel throughout the abdomen. There appears to be diffuse wall thickening involving the transverse and left colon. Possible bowel wall thickening at the cecum. In addition, there is probably wall thickening involving small bowel in the left upper abdomen. Vascular/Lymphatic: Atherosclerotic calcifications in the abdominal aorta without aneurysm. No significant lymph node enlargement in the abdomen or pelvis. Reproductive: Limited evaluation of the prostate. Large amount of fluid in the scrotum bilaterally. This finding was present on the previous examination and most compatible with bilateral hydroceles. There is Kesha Hurrell small amount of gas near the right inguinal canal and not clear if this is within the canal or the subcutaneous tissues. Other: Small to moderate amount of fluid in the left upper abdomen which may represent ascites. Small amount of fluid in the left lower abdomen. There is Shane [name] small amount of fluid in the pelvis surrounding the surgical drain. There is Shane [name] right-sided surgical drain that extends into the pelvis and the tip is on the left side of the pelvis. Large amount of  subcutaneous edema. Musculoskeletal: No acute bone abnormality. Scattered subcutaneous gas in the abdomen and pelvis. IMPRESSION: 1. Diffuse dilatation of small bowel and colon. In addition, there is evidence for colonic wall thickening and suspect there is small bowel wall thickening in the left upper abdomen. Findings raise concern for colitis and enteritis. There is likely an adynamic ileus. 2. Anasarca with bilateral pleural effusions, ascites and large amount of subcutaneous edema. 3. Scattered intra-abdominal free fluid/ascites. Largest component is in the left upper abdomen and small amount of fluid in the pelvis surround the surgical drain. 4. Focal low-density fluid collection posterior to the right hepatic lobe likely in the subcapsular space. This low-density collection measures up to 7.5 cm. This could represent loculated ascites but indeterminate. 5. Distended esophagus. Nasogastric tube is coiled in the stomach but the tip is actually in the distal esophagus. Consider repositioning of the nasogastric tube. 6. Bilateral hydroceles. These results were called by telephone at the time of interpretation on 03/11/2021 at 4:11 pm to provider Shane [name] , who verbally acknowledged these results. Electronically Signed   By: Markus Daft M.D.   On: 03/11/2021 16:17   IR US Guide Bx Asp/Drain  Result Date: 03/12/2021 INDICATION: History of rectal cancer post LAR with end colostomy, now with postoperative abdominal distension and fevers with indeterminate fluid  collection within the left upper abdominal quadrant. Please perform ultrasound-guided aspiration and/or drainage [name] placement for infection source control purposes. EXAM: ULTRASOUND-GUIDED PERCUTANEOUS DRAINAGE [name] PLACEMENT COMPARISON:  CT abdomen pelvis-03/11/2021 MEDICATIONS: The patient is currently admitted to the [name] and receiving intravenous antibiotics. The antibiotics were administered within an appropriate time frame prior to the  initiation of the procedure. ANESTHESIA/SEDATION: None CONTRAST:  None COMPLICATIONS: None immediate. PROCEDURE: Informed written consent was obtained from the patient's wife after Yanna Leaks discussion of the risks, benefits and alternatives to treatment. The patient was placed supine on his ICU [name] bed and preprocedural sonographic evaluation was performed of the left upper abdominal quadrant demonstrating small to moderate-sized moderately complex fluid collection compatible with the findings seen on preceding abdominal CT image 26, series 2. the procedure was planned. Shane [name] timeout was performed prior to the initiation of the procedure. The skin overlying the left upper abdominal quadrant was prepped and draped in the usual sterile fashion. The overlying soft tissues were anesthetized with 1% lidocaine with epinephrine an 18 gauge trocar needle was utilized to access the fluid collection. Shane [name] small amount of purulent fluid was aspirated the decision was made to proceed with definitive percutaneous drainage [name] placement As such, Shane [name] short Amplatz wire was coiled within the collection. Appropriate position was confirmed with sonographic evaluation. Multiple ultrasound images were saved procedural documentation purposes. Next, the track was dilated ultimately allowing placement of Shane [name] 12 [place] all-purpose drainage [name]. Appropriate positioning was again confirmed with sonographic evaluation and image documentation Next, approximately 160 cc of purulent fluid was aspirated. The tube was connected to Shane Mccomber drainage bag and sutured in place. Shirrell Solinger dressing was placed. The patient tolerated the procedure well without immediate post procedural complication. IMPRESSION: Successful CT guided placement of Rickita Forstner 76 [place] all purpose drain [name] into the complex fluid collection with the left upper abdominal quadrant with aspiration of 160 mL of purulent fluid. Samples were sent to the laboratory as requested by the ordering clinical  team. Electronically Signed   By: Sandi Mariscal M.D.   On: 03/12/2021 07:52        Scheduled Meds: . bacitracin   Topical BID  . chlorhexidine  15 mL Mouth Rinse BID  . Chlorhexidine Gluconate Cloth  6 each Topical Daily  . [START ON 03/13/2021] enoxaparin (LOVENOX) injection  40 mg Subcutaneous Q24H  . insulin aspart  0-9 Units Subcutaneous Q4H  . lip balm  1 application Topical BID  . mouth rinse  15 mL Mouth Rinse q12n4p  . melatonin  3 mg Oral QHS  . pantoprazole (PROTONIX) IV  40 mg Intravenous Q12H  . sodium chloride flush  10-40 mL Intracatheter Q12H  . sodium chloride flush  5 mL Intracatheter Q8H  . sodium chloride HYPERTONIC  4 mL Nebulization BID   Continuous Infusions: . sodium chloride Stopped (03/12/21 0825)  . anidulafungin    . dexmedetomidine (PRECEDEX) IV infusion 0.4 mcg/kg/hr (03/12/21 0924)  . dextrose Stopped (03/12/21 0931)  . norepinephrine (LEVOPHED) Adult infusion Stopped (03/12/21 [id number])  . piperacillin-tazobactam (ZOSYN)  IV 12.5 mL/hr at 03/12/21 0838  . potassium chloride 10 mEq (03/12/21 0928)  . sodium chloride    . TPN ADULT (ION) 65 mL/hr at 03/12/21 0838  . TPN ADULT (ION)       LOS: 7 days    Time spent: over 30 min    Fayrene Helper, MD [facility]   To contact the attending provider between 7A-7P or the covering provider during after hours  7P-7A, please log into the web site [email or web address] and access using universal [place] password for that web site. If you do not have the password, please call the [name] operator.  03/12/2021, 9:37 AM

## 2021-03-12 NOTE — Progress Notes (Signed)
MD White paged in regards to patient's fever of 101.9 F orally and unable to place NG tube. NG tube was attempted x4 and coiled each time with patient vomiting. Nausea medication was given during attempts. MD White ordered for IV tylenol q 6hrs prn for fever and also a chest x-ray for possible aspiration. Discussed with pharmacist Sharyn Lull to assist with placing tylenol IV order. Patient will remain NPO.

## 2021-03-12 NOTE — Progress Notes (Signed)
Chaplain engaged in an initial visit with Shane [name] and his grandson, Shane [name].  Chaplain offered support.    03/12/21 1200  Clinical Encounter Type  Visited With Patient and family together  Visit Type Initial

## 2021-03-12 NOTE — Progress Notes (Signed)
NAME:  Shane [name], MRN:  [id number], DOB:  1951/08/26, LOS: 7 ADMISSION DATE:  03/05/2021, CONSULTATION DATE:  03/12/2021  REFERRING MD:  Milana Kidney, CHIEF COMPLAINT:  resp distress   History of Present Illness:  70 year old man presented with rectal bleeding, found to have rectal cancer, underwent anterior resection with end colostomy on 4/27.  Postop course complicated by ileus and AKI. NGT was attempted today but could not be placed. Creatinine has plateaued, given Lasix 5/1 Developed confusion overnight, pain medications were switched from Dilaudid to fentanyl with some improvement .  Venous Dopplers were negative for DVT 5/1 He developed increasing respiratory distress and tachypnea, hence PCCM consulted although oxygen requirements have not increased Febrile 100.7  He is status post radiation and Xeloda 01/03/2021  Pertinent  Medical History    Significant Hospital Events: Including procedures, antibiotic start and stop dates in addition to other pertinent events   . QTC 411 on 03/06/21 . Overnight 5/2, Hgb drop to 5.2.  2u PRBC ordered and started on Levophed for hypotension. START TPN . 03/11/21 : Hgb drop to 5.2 .  Hypotensive overnight, now on low dose levo, BP adequate. Awaiting 2u PRBC transfusion this AM. Surgery planning for CT scan abd / pelv. Continue zosyn. Start Eraxis.   Interim History / Subjective:   03/12/21: Pathology results returned yesterday.  Rectal cancer.   Consistent with [id number]. Downgrades to a high risk stage II.  Started on blood and vasopressors yesterday.  CT scan yesterday showed left upper quadrant intrasplenic and left flank fluid.  Yesterday status post CT-guided placement of 10 French catheter drainage of the left upper abdominal quadrant yielding 160 cc of purulent appearing fluid culture sent -> later in the evening shock was improving and AKI improving.    Currently on RA 97%.  On low-dose Levophed.  On antibiotics.  Still febrile.  Has foley  since admit.  According to bedside nurse mostly oriented but intermittently delirious but also complains of nonspecific pain diffusely.  Also sensitive to touch in terms of pain.  On Vicodin at home as needed  Pulled out NG tube  Objective   Blood pressure (!) 118/33, pulse 88, temperature (!) 100.4 F (38 C), temperature source Axillary, resp. rate (!) 27, weight 86.2 kg, SpO2 100 %.        Intake/Output Summary (Last 24 hours) at 03/12/2021 0752 Last data filed at 03/12/2021 0733 Gross per 24 hour  Intake 4683.5 ml  Output 8365 ml  Net -3681.5 ml   Filed Weights   03/09/21 0500 03/10/21 0500 03/12/21 0446  Weight: 83.8 kg 76.2 kg 86.2 kg    Examination:     Labs/imaging that I havepersonally reviewed  (right click and "Reselect all SmartList Selections" daily)  Chest x-ray shows bibasal infiltrates and effusions. X-ray abdomen does not show any evidence of bowel obstruction   Resolved Hospital Problem list     Assessment & Plan:   Anemia -    - 03/11/21: has had melena and coffee ground output from NGT.  ? Gastritis vs other upper GI.  2u PRBC ordered, starting transfusion shortly. And s/p - Transfuse 2u PRBC   -03/12/2021: No active bleeding.  Pressor requirement is low but has not responded adequately to the 2 units of PRBC from yesterday  Plan - F/u repeat H/H. -Maintain D/c Lovenox from 03/11/2021 - Agree with PPI - pantoprazole 40mg  BID ordered by surgery.  - Might need GI consult if melena or coffee ground output persists, ?  EGD.   Circulatory shock resumed 2/2 above. +/  sepsis component (status post pus collection in the left upper quadrant and status post drainage 03/11/2021]  03/12/2021 = improved pressor needs yesterday after blood transfusion but currently on low-dose pressors without any active bleeding  Plan - Recheck CBC 03/12/2021 - Continue low dose levophed for goal MAP > 65.  Anticipate this can come off once blood transfusion has completed. -Left upper  quadrant drainage since 03/11/2021 -Antibiotics as below per primary service  -Eraxis 03/11/2021  -Zosyn 03/08/2021 - 03/13/2021  -Cefotetan 03/05/2021 - 03/05/2021      Acute respiratory distress - this appeared to be related to ileus and bibasilar atelectasis.  Now improved. No reason to suspect PE - Likely other causes of elevated D-dimer in the situation including malignancy.   -03/12/2021: On room air and saturating well but tachypneic secondary to SIRS  Plan -Check chest x-ray 03/12/2021 - Continue IS and push bronchial hygiene. - Keep NGT to LIS. - Mobilize as able. - Not a candidate for BiPAP currently.  Ileus. - Continue NGT to LIS for decompression. - Continue TPN since 03/10/2021  AKI -   03/12/2021: creatinine has plateaued and he has decent urine output so expect this to turn around eventually.  Plan  -500 cc LR bolus - Continue supportive care. - Avoid contrast and other nephrotoxins.  Hypokalemia.  03/12/2021 -already repleted by primary service/  Plan  -Potassium goal greater than 4 - Magnesium goal greater than 2  Delirium - likely ICU delirium.  03/12/2021: Intermittent delirium present.  Med review shows chronic Vicodin as needed.  Also complains of nonspecific diffuse pain  Plan - Low-dose fentanyl as needed - Start Precedex - OK to use Haldol as QTC permits.  Rectal CA - s/p resection with end colostomy. - Per surgery. - F/u with oncology.      ATTESTATION & SIGNATURE   The patient Shane [name] is critically ill with multiple organ systems failure and requires high complexity decision making for assessment and support, frequent evaluation and titration of therapies, application of advanced monitoring technologies and extensive interpretation of multiple databases.   Critical Care Time devoted to patient care services described in this note is  40  Minutes. This time reflects time of care of this signee Dr Brand Males. This critical care time does  not reflect procedure time, or teaching time or supervisory time of PA/NP/Med student/Med Resident etc but could involve care discussion time     Dr. Brand Males, M.D., [facility].C.P Pulmonary and Critical Care Medicine Staff Physician [place] Pager: [phone], If no answer or between  15:00h - 7:00h: call [phone]  03/12/2021 7:52 AM     LABS    PULMONARY Recent Labs  Lab 03/08/21 1313 03/11/21 0424  PHART 7.362 7.563*  PCO2ART 26.4* 32.0  PO2ART 93.2 52.8*  HCO3 14.6* 29.0*  O2SAT 96.7 88.4    CBC Recent Labs  Lab 03/09/21 0211 03/10/21 0233 03/11/21 0231 03/11/21 1510 03/11/21 1738 03/12/21 0539  HGB 8.6* 7.9* 5.4* 7.5* 7.3* 7.5*  HCT 23.7* 21.5* 15.1* 20.7* 20.3* 21.8*  WBC 6.1 7.2 11.4*  --   --   --   PLT 114* 123* 136*  --   --   --     COAGULATION No results for input(s): INR in the last 168 hours.  CARDIAC  No results for input(s): TROPONINI in the last 168 hours. No results  for input(s): PROBNP in the last 168 hours.   CHEMISTRY Recent Labs  Lab 03/08/21 0520 03/09/21 0211 03/10/21 0233 03/11/21 0231 03/11/21 1738 03/12/21 0539  NA 137 141 144 147*  --  148*  K 4.2 3.8 3.2* 3.2* 5.0 3.3*  CL 111 112* 110 111  --  114*  CO2 17* 18* 23 27  --  26  GLUCOSE 99 90 105* 140*  --  128*  BUN 49* 60* 61* 58*  --  39*  CREATININE 2.97* 2.93* 2.10* 1.48*  --  1.40*  CALCIUM 8.3* 8.7* 8.6* 8.1*  --  8.1*  MG 1.8 1.8 2.2 2.2  --  2.2  PHOS 3.9  --   --  1.7*  --  3.2   Estimated Creatinine Clearance: 50 mL/min (A) (by C-G formula based on SCr of 1.4 mg/dL (H)).   LIVER Recent Labs  Lab 03/08/21 0520 03/09/21 0211 03/11/21 0231  AST 41 35 30  ALT 30 30 22   ALKPHOS 62 41 46  BILITOT 1.1 0.9 1.2  PROT 5.4* 5.8* 5.1*  ALBUMIN 2.5* 2.7* 2.2*     INFECTIOUS Recent Labs  Lab 03/11/21 0450 03/11/21 0809  LATICACIDVEN 2.3* 2.9*     ENDOCRINE CBG (last 3)  Recent Labs     03/11/21 2346 03/12/21 0321 03/12/21 0729  GLUCAP 131* 133* 119*         IMAGING x48h  - image(s) personally visualized  -   highlighted in bold CT ABDOMEN PELVIS W CONTRAST  Result Date: 03/11/2021 CLINICAL DATA:  70 year old history of rectal cancer and status post low anterior resection with end colostomy. Postoperative fevers. EXAM: CT ABDOMEN AND PELVIS WITH CONTRAST TECHNIQUE: Multidetector CT imaging of the abdomen and pelvis was performed using the standard protocol following bolus administration of intravenous contrast. CONTRAST:  168mL OMNIPAQUE IOHEXOL 300 MG/ML  SOLN COMPARISON:  CT abdomen and pelvis 10/31/2020 FINDINGS: Lower chest: Bilateral pleural effusions. Pleural effusions are small to moderate in size but incompletely evaluated. Compressive atelectasis in both lower lobes. The distal esophagus is distended. The nasogastric tube is coiled in the stomach and the tip actually extends back into the distal esophagus. Hepatobiliary: Normal appearance of the liver. There is low-density collection along the posterior aspect of the right hepatic lobe which could be in the subcapsular space. This collection measures 5.2 x 4.1 x 7.5 cm. No gross abnormality to the gallbladder. No significant biliary dilatation. Pancreas: Unremarkable. No pancreatic ductal dilatation or surrounding inflammatory changes. Spleen: Normal appearance of the spleen. Adrenals/Urinary Tract: Normal appearance of the adrenal glands. Evidence for cysts in the right kidney. Negative for hydronephrosis. Urinary bladder is decompressed with a Foley catheter. Stomach/Bowel: Patient has a left colostomy with a Hartmann's pouch. There is a small amount of fluid in the residual rectum. Distal esophagus appears to be markedly distended but difficult to exclude wall thickening in the esophagus. Nasogastric tube is coiled in the stomach but the tip actually extends cephalad into the distal esophagus. Dilated fluid-filled loops of  small bowel throughout the abdomen. There appears to be diffuse wall thickening involving the transverse and left colon. Possible bowel wall thickening at the cecum. In addition, there is probably wall thickening involving small bowel in the left upper abdomen. Vascular/Lymphatic: Atherosclerotic calcifications in the abdominal aorta without aneurysm. No significant lymph node enlargement in the abdomen or pelvis. Reproductive: Limited evaluation of the prostate. Large amount of fluid in the scrotum bilaterally. This finding was present on the previous examination  and most compatible with bilateral hydroceles. There is a small amount of gas near the right inguinal canal and not clear if this is within the canal or the subcutaneous tissues. Other: Small to moderate amount of fluid in the left upper abdomen which may represent ascites. Small amount of fluid in the left lower abdomen. There is a small amount of fluid in the pelvis surrounding the surgical drain. There is a right-sided surgical drain that extends into the pelvis and the tip is on the left side of the pelvis. Large amount of subcutaneous edema. Musculoskeletal: No acute bone abnormality. Scattered subcutaneous gas in the abdomen and pelvis. IMPRESSION: 1. Diffuse dilatation of small bowel and colon. In addition, there is evidence for colonic wall thickening and suspect there is small bowel wall thickening in the left upper abdomen. Findings raise concern for colitis and enteritis. There is likely an adynamic ileus. 2. Anasarca with bilateral pleural effusions, ascites and large amount of subcutaneous edema. 3. Scattered intra-abdominal free fluid/ascites. Largest component is in the left upper abdomen and small amount of fluid in the pelvis surround the surgical drain. 4. Focal low-density fluid collection posterior to the right hepatic lobe likely in the subcapsular space. This low-density collection measures up to 7.5 cm. This could represent loculated  ascites but indeterminate. 5. Distended esophagus. Nasogastric tube is coiled in the stomach but the tip is actually in the distal esophagus. Consider repositioning of the nasogastric tube. 6. Bilateral hydroceles. These results were called by telephone at the time of interpretation on 03/11/2021 at 4:11 pm to provider [facility] , who verbally acknowledged these results. Electronically Signed   By: Markus Daft M.D.   On: 03/11/2021 16:17

## 2021-03-12 NOTE — Progress Notes (Signed)
Pt had been c/o increasing pain and hiccoughs since NGT was removed this AM. Dr. Marcello Moores notified by this RN, and orders received to replace NGT after Abdominal X-Ray was reviewed.   This RN has attempted several times to insert NGT, without success d/t pt vomiting. PRN Zofran given and this RN will again attempt NGT insertion after pt able to rest briefly. This RN will continue to closely monitor pt.

## 2021-03-12 NOTE — Progress Notes (Signed)
Pt removed NGT this AM at 0800. Dr.[name] notified by this RN, and orders to leave tube out for now and monitor for signs of nausea, or increased abdominal pain. Pt had moderate drainage to LIS overnight, and MD aware. This RN will notify Surgery with changes in patient condition.

## 2021-03-12 NOTE — Progress Notes (Signed)
PT Cancellation Note  Patient Details Name: Shane [name] MRN: [id number] DOB: 08-04-1951   Cancelled Treatment:    Reason Eval/Treat Not Completed: Medical issues which prohibited therapy, to get blood, CT. Will check back 5/6.   Claretha Cooper 03/12/2021, 8:42 AM  [place] Pager [phone] Office [phone]

## 2021-03-12 NOTE — Progress Notes (Signed)
Rectal carcinoma (HCC)  Subjective: Pt pulled out NG.  Alert and answering questions, somewhat confused.  Feels pain all over.  Objective: Vital signs in last 24 hours: Temp:  [98 F (36.7 C)-101.2 F (38.4 C)] 98 F (36.7 C) (05/04 0814) Pulse Rate:  [88-112] 104 (05/04 0850) Resp:  [16-38] 30 (05/04 0850) BP: (104-158)/(29-90) 140/90 (05/04 0800) SpO2:  [97 %-100 %] 97 % (05/04 0850) Weight:  [86.2 kg] 86.2 kg (05/04 0446) Last BM Date: 03/12/21  Intake/Output from previous day: 05/03 0701 - 05/04 0700 In: 4617.5 [I.V.:2369.5; Blood:630; NG/GT:500; IV [id number]:7829] Out: 8365 [Urine:3475; Emesis/NG output:3700; Drains:340; Stool:850] Intake/Output this shift: Total I/O In: 175.5 [I.V.:151.2; IV Piggyback:24.3] Out: 550 [Emesis/NG output:550]  General appearance: alert and cooperative GI: normal findings: soft, TTP diffusely, no peritonitis Incision/Wound:  Lab Results:  Results for orders placed or performed during the hospital encounter of 03/05/21 (from the past 24 hour(s))  Glucose, capillary     Status: Abnormal   Collection Time: 03/11/21 11:26 AM  Result Value Ref Range   Glucose-Capillary 136 (H) 70 - 99 mg/dL  Hemoglobin and hematocrit, blood     Status: Abnormal   Collection Time: 03/11/21  3:10 PM  Result Value Ref Range   Hemoglobin 7.5 (L) 13.0 - 17.0 g/dL   HCT 20.7 (L) 39.0 - 52.0 %  Glucose, capillary     Status: Abnormal   Collection Time: 03/11/21  3:51 PM  Result Value Ref Range   Glucose-Capillary 123 (H) 70 - 99 mg/dL  Aerobic/Anaerobic Culture (surgical/deep wound)     Status: None (Preliminary result)   Collection Time: 03/11/21  4:52 PM   Specimen: Wound; Abscess  Result Value Ref Range   Specimen Description      WOUND Performed at [facility] [address] [place], [place] [address]    Special Requests SYRINGE    Gram Stain      ABUNDANT WBC PRESENT, PREDOMINANTLY PMN RARE GRAM POSITIVE COCCI Performed at  [facility], [place] [address] [place], [place] [address]    Culture PENDING    Report Status PENDING   Potassium     Status: None   Collection Time: 03/11/21  5:38 PM  Result Value Ref Range   Potassium 5.0 3.5 - 5.1 mmol/L  Hemoglobin and hematocrit, blood     Status: Abnormal   Collection Time: 03/11/21  5:38 PM  Result Value Ref Range   Hemoglobin 7.3 (L) 13.0 - 17.0 g/dL   HCT 20.3 (L) 39.0 - 52.0 %  Glucose, capillary     Status: Abnormal   Collection Time: 03/11/21  7:38 PM  Result Value Ref Range   Glucose-Capillary 130 (H) 70 - 99 mg/dL  Glucose, capillary     Status: Abnormal   Collection Time: 03/11/21 11:46 PM  Result Value Ref Range   Glucose-Capillary 131 (H) 70 - 99 mg/dL  Glucose, capillary     Status: Abnormal   Collection Time: 03/12/21  3:21 AM  Result Value Ref Range   Glucose-Capillary 133 (H) 70 - 99 mg/dL  Hemoglobin and hematocrit, blood     Status: Abnormal   Collection Time: 03/12/21  5:39 AM  Result Value Ref Range   Hemoglobin 7.5 (L) 13.0 - 17.0 g/dL   HCT 21.8 (L) 39.0 - 84.6 %  Basic metabolic panel     Status: Abnormal   Collection Time: 03/12/21  5:39 AM  Result Value Ref Range   Sodium 148 (H) 135 -  145 mmol/L   Potassium 3.3 (L) 3.5 - 5.1 mmol/L   Chloride 114 (H) 98 - 111 mmol/L   CO2 26 22 - 32 mmol/L   Glucose, Bld 128 (H) 70 - 99 mg/dL   BUN 39 (H) 8 - 23 mg/dL   Creatinine, Ser 1.40 (H) 0.61 - 1.24 mg/dL   Calcium 8.1 (L) 8.9 - 10.3 mg/dL   GFR, Estimated 54 (L) >60 mL/min   Anion gap 8 5 - 15  Magnesium     Status: None   Collection Time: 03/12/21  5:39 AM  Result Value Ref Range   Magnesium 2.2 1.7 - 2.4 mg/dL  Phosphorus     Status: None   Collection Time: 03/12/21  5:39 AM  Result Value Ref Range   Phosphorus 3.2 2.5 - 4.6 mg/dL  CBC     Status: Abnormal   Collection Time: 03/12/21  5:39 AM  Result Value Ref Range   WBC 20.6 (H) 4.0 - 10.5 K/uL   RBC 2.51 (L) 4.22 - 5.81 MIL/uL   Hemoglobin 7.5 (L) 13.0 - 17.0  g/dL   HCT 21.3 (L) 39.0 - 52.0 %   MCV 84.9 80.0 - 100.0 fL   MCH 29.9 26.0 - 34.0 pg   MCHC 35.2 30.0 - 36.0 g/dL   RDW 15.5 11.5 - 15.5 %   Platelets 170 150 - 400 K/uL   nRBC 8.0 (H) 0.0 - 0.2 %  Glucose, capillary     Status: Abnormal   Collection Time: 03/12/21  7:29 AM  Result Value Ref Range   Glucose-Capillary 119 (H) 70 - 99 mg/dL   Comment 1 Notify RN    Comment 2 Document in Chart      Studies/Results Radiology     MEDS, Scheduled . bacitracin   Topical BID  . chlorhexidine  15 mL Mouth Rinse BID  . Chlorhexidine Gluconate Cloth  6 each Topical Daily  . [START ON 03/13/2021] enoxaparin (LOVENOX) injection  40 mg Subcutaneous Q24H  . insulin aspart  0-9 Units Subcutaneous Q4H  . lip balm  1 application Topical BID  . mouth rinse  15 mL Mouth Rinse q12n4p  . melatonin  3 mg Oral QHS  . pantoprazole (PROTONIX) IV  40 mg Intravenous Q12H  . sodium chloride flush  10-40 mL Intracatheter Q12H  . sodium chloride flush  5 mL Intracatheter Q8H  . sodium chloride HYPERTONIC  4 mL Nebulization BID     Assessment: Rectal carcinoma (HCC) Post op abscess  Plan: Cont IVF's Cont IV abx Cont TPN Try to keep off pressors if possible OOB and dangle legs RT for pulm toilet Daughter updated via phone today Will check CXR and AXR  LOS: 7 days    Rosario Adie, MD [facility]    03/12/2021 9:11 AM

## 2021-03-12 NOTE — Progress Notes (Signed)
Shock resolving MEWS score much improved UOP OK w falling Cr. = AKI resolving Give Lasix x1 to help w diuresis Keep foley given diuresis, bedridden state, confusion, need for strict I&O Follow K, Cr, etc

## 2021-03-12 NOTE — Progress Notes (Signed)
PHARMACY - TOTAL PARENTERAL NUTRITION CONSULT NOTE   Indication: Prolonged ileus  Patient Measurements: Weight: 86.2 kg (190 lb 0.6 oz) (Bed Scale with bilateral boots off and scd's and extra blankets off)   Body mass index is 27.27 kg/m.   Assessment: 70 yo male with rectal cancer s/p xi robotic assisted lower anterior resection with end colostomy on 4/27 now with prolonged ileus to start TPN on 5/2  Glucose / Insulin: CBGs continue to be at goal < 150 Electrolytes: Na 148, up. K 332, low, CL 114 up, phos and mag WNL.  Renal: SCr 1.40 improved, BUN 39 improved Hepatic: AST/ALT WNL stable, tbili 1.2, alkphos 46 Albumin/prealbumin: alb 2.2 (5/3), prealb <5 (5/3) Intake / Output; MIVF: I/O last 24hr - 4681mL/8365mL (net -3775mL with significant NG output of 3700 mL, stool output of 850 mL, drain output of 325mL and UOP of 3475 mL), D5 at 50 ml/hr x 10 hr per Md then stop GI Imaging: GI Surgeries / Procedures:  4/27:  xi robotic assisted lower anterior resection with end colostomy on 4/27  Central access: 5/2 TPN start date: 5/2  Nutritional Goals (per RD recommendation on 5/3): 2200-2400 kcal/day, protein 90-115 g/day, and >2L/day  Goal TPN rate pending RD assessment is 90 mL/hr (provides 114 g of protein and 2199 kcals per day)  Current Nutrition:  NPO  Plan:   58mEq KCL IV already ordered per Md this AM for low K    Increase TPN rate from 59ml/hr to goal 34mL/hr at 1800  Electrolytes in TPN: Na 13mEq/L (decr), K 15mEq/L (incr), Ca 2mEq/L, Mg 78mEq/L, and Phos 84mmol/L. Cl:Ac 1:2 (change)  Add standard MVI and trace elements to TPN  Continue Sensitive q4h SSI and adjust as needed   IV fluids to stop ~1900 per Md orders  Monitor TPN labs on Mon/Thurs and as needed   Adrian Saran, PharmD, BCPS Secure Chat if ?s 03/12/2021 8:55 AM

## 2021-03-13 DIAGNOSIS — C20 Malignant neoplasm of rectum: Secondary | ICD-10-CM | POA: Diagnosis not present

## 2021-03-13 DIAGNOSIS — R195 Other fecal abnormalities: Secondary | ICD-10-CM

## 2021-03-13 DIAGNOSIS — A419 Sepsis, unspecified organism: Secondary | ICD-10-CM

## 2021-03-13 DIAGNOSIS — K920 Hematemesis: Secondary | ICD-10-CM

## 2021-03-13 DIAGNOSIS — Z85048 Personal history of other malignant neoplasm of rectum, rectosigmoid junction, and anus: Secondary | ICD-10-CM

## 2021-03-13 DIAGNOSIS — R6521 Severe sepsis with septic shock: Secondary | ICD-10-CM | POA: Diagnosis not present

## 2021-03-13 DIAGNOSIS — E870 Hyperosmolality and hypernatremia: Secondary | ICD-10-CM

## 2021-03-13 DIAGNOSIS — D62 Acute posthemorrhagic anemia: Secondary | ICD-10-CM | POA: Diagnosis not present

## 2021-03-13 DIAGNOSIS — K922 Gastrointestinal hemorrhage, unspecified: Secondary | ICD-10-CM

## 2021-03-13 DIAGNOSIS — A4181 Sepsis due to Enterococcus: Secondary | ICD-10-CM

## 2021-03-13 LAB — PREPARE RBC (CROSSMATCH)

## 2021-03-13 LAB — COMPREHENSIVE METABOLIC PANEL
ALT: 19 U/L (ref 0–44)
AST: 27 U/L (ref 15–41)
Albumin: 2.1 g/dL — ABNORMAL LOW (ref 3.5–5.0)
Alkaline Phosphatase: 71 U/L (ref 38–126)
Anion gap: 6 (ref 5–15)
BUN: 52 mg/dL — ABNORMAL HIGH (ref 8–23)
CO2: 24 mmol/L (ref 22–32)
Calcium: 7.9 mg/dL — ABNORMAL LOW (ref 8.9–10.3)
Chloride: 122 mmol/L — ABNORMAL HIGH (ref 98–111)
Creatinine, Ser: 1.29 mg/dL — ABNORMAL HIGH (ref 0.61–1.24)
GFR, Estimated: 59 mL/min — ABNORMAL LOW (ref >60)
Glucose, Bld: 151 mg/dL — ABNORMAL HIGH (ref 70–99)
Potassium: 3.8 mmol/L (ref 3.5–5.1)
Sodium: 152 mmol/L — ABNORMAL HIGH (ref 135–145)
Total Bilirubin: 0.7 mg/dL (ref 0.3–1.2)
Total Protein: 5.4 g/dL — ABNORMAL LOW (ref 6.5–8.1)

## 2021-03-13 LAB — CBC
HCT: 16.3 % — ABNORMAL LOW (ref 39.0–52.0)
Hemoglobin: 5.6 g/dL — CL (ref 13.0–17.0)
MCH: 32 pg (ref 26.0–34.0)
MCHC: 34.4 g/dL (ref 30.0–36.0)
MCV: 93.1 fL (ref 80.0–100.0)
Platelets: 155 10*3/uL (ref 150–400)
RBC: 1.75 MIL/uL — ABNORMAL LOW (ref 4.22–5.81)
RDW: 16.6 % — ABNORMAL HIGH (ref 11.5–15.5)
WBC: 20.1 10*3/uL — ABNORMAL HIGH (ref 4.0–10.5)
nRBC: 19 % — ABNORMAL HIGH (ref 0.0–0.2)

## 2021-03-13 LAB — MAGNESIUM
Magnesium: 2.3 mg/dL (ref 1.7–2.4)
Magnesium: 2.2 mg/dL (ref 1.7–2.4)

## 2021-03-13 LAB — BASIC METABOLIC PANEL
Anion gap: 6 (ref 5–15)
BUN: 54 mg/dL — ABNORMAL HIGH (ref 8–23)
CO2: 24 mmol/L (ref 22–32)
Calcium: 7.8 mg/dL — ABNORMAL LOW (ref 8.9–10.3)
Chloride: 121 mmol/L — ABNORMAL HIGH (ref 98–111)
Creatinine, Ser: 1.28 mg/dL — ABNORMAL HIGH (ref 0.61–1.24)
GFR, Estimated: 60 mL/min — ABNORMAL LOW (ref >60)
Glucose, Bld: 147 mg/dL — ABNORMAL HIGH (ref 70–99)
Potassium: 3.7 mmol/L (ref 3.5–5.1)
Sodium: 151 mmol/L — ABNORMAL HIGH (ref 135–145)

## 2021-03-13 LAB — GLUCOSE, CAPILLARY
Glucose-Capillary: 120 mg/dL — ABNORMAL HIGH (ref 70–99)
Glucose-Capillary: 124 mg/dL — ABNORMAL HIGH (ref 70–99)
Glucose-Capillary: 128 mg/dL — ABNORMAL HIGH (ref 70–99)
Glucose-Capillary: 137 mg/dL — ABNORMAL HIGH (ref 70–99)
Glucose-Capillary: 137 mg/dL — ABNORMAL HIGH (ref 70–99)
Glucose-Capillary: 144 mg/dL — ABNORMAL HIGH (ref 70–99)
Glucose-Capillary: 144 mg/dL — ABNORMAL HIGH (ref 70–99)

## 2021-03-13 LAB — HEMOGLOBIN AND HEMATOCRIT, BLOOD
HCT: 22.6 % — ABNORMAL LOW (ref 39.0–52.0)
Hemoglobin: 7.7 g/dL — ABNORMAL LOW (ref 13.0–17.0)

## 2021-03-13 LAB — PROCALCITONIN: Procalcitonin: 6.53 ng/mL

## 2021-03-13 LAB — PHOSPHORUS
Phosphorus: 2.9 mg/dL (ref 2.5–4.6)
Phosphorus: 3 mg/dL (ref 2.5–4.6)

## 2021-03-13 MED ORDER — PIPERACILLIN-TAZOBACTAM 3.375 G IVPB
3.3750 g | Freq: Three times a day (TID) | INTRAVENOUS | Status: AC
  Administered 2021-03-13 – 2021-03-16 (×10): 3.375 g via INTRAVENOUS
  Filled 2021-03-13 (×11): qty 50

## 2021-03-13 MED ORDER — SODIUM CHLORIDE 0.9% IV SOLUTION: Freq: Once | INTRAVENOUS | Status: AC

## 2021-03-13 MED ORDER — PANTOPRAZOLE SODIUM 40 MG IV SOLR
40.0000 mg | Freq: Two times a day (BID) | INTRAVENOUS | Status: DC
  Administered 2021-03-17 – 2021-03-27 (×23): 40 mg via INTRAVENOUS
  Filled 2021-03-13 (×22): qty 40

## 2021-03-13 MED ORDER — SODIUM CHLORIDE 0.9% IV SOLUTION: Freq: Once | INTRAVENOUS | Status: DC

## 2021-03-13 MED ORDER — TRAVASOL 10 % IV SOLN
INTRAVENOUS | Status: AC
  Filled 2021-03-13: qty 1123.2

## 2021-03-13 MED ORDER — LACTATED RINGERS IV BOLUS: 1000.0000 mL | Freq: Three times a day (TID) | INTRAVENOUS | Status: DC | PRN

## 2021-03-13 MED ORDER — SODIUM CHLORIDE 0.9 % IV SOLN
8.0000 mg/h | INTRAVENOUS | Status: AC
  Administered 2021-03-13 – 2021-03-16 (×8): 8 mg/h via INTRAVENOUS
  Filled 2021-03-13 (×12): qty 80

## 2021-03-13 NOTE — Consult Note (Addendum)
[place] Nurse ostomy follow up Surgical team following for assessment and plan of care for abd wound.  Stoma type/location: LUQ colosotmy Stomal assessment/size: 1 and 3/4 inch, slightly above skin level, 85% red, 15% slough. Lumen in center Peristomal assessment: intact Output: mod amt brown liquid stool Ostomy pouching: 1pc.soft convex with skin barrier ring (pouch Kellie Simmering # [id number] and barrier ring Kellie Simmering # [phone]) Education provided:  Pt is critically ill in ICU and is not awake at this time.  [place] team will begin teaching sessions when stable and out of ICU.  Applied barrier ring and one piece convex pouch. Enrolled patient in [place] Start Discharge program: Yes, previously Supplies in room (3 pouches, 3 rings, pattern, sizing guide, belt) in plastic bag on window sill. Julien Girt MSN, RN, [place], [place], [place]

## 2021-03-13 NOTE — Progress Notes (Signed)
PHARMACY - TOTAL PARENTERAL NUTRITION CONSULT NOTE   Indication: Prolonged ileus  Patient Measurements: Weight: 85.2 kg (187 lb 13.3 oz)   Body mass index is 26.95 kg/m.   Assessment: 70 yo male with rectal cancer s/p xi robotic assisted lower anterior resection with end colostomy on 4/27 now with prolonged ileus to start TPN on 5/2  Glucose / Insulin: CBGs continue to be at goal < 150 Electrolytes: Na further increase to 151.  K 3.7 inc to WNL.  Cl elevated, CO2 WNL. Renal: SCr 1.28 improved, BUN up to 54 Hepatic: AST/ALT WNL stable, tbili 1.2, alkphos 46 Albumin/prealbumin: alb 2.2 (5/3), prealb <5 (5/3) Intake / Output; MIVF: I/O last 24hr net -351 mL.  (increasing stool output to 1760 mL, decreased NG output 550 mL, decreased drain output of 110mL and decreased UOP 1750 mL).  IVF d/c per MD. GI Imaging: GI Surgeries / Procedures:  4/27:  xi robotic assisted lower anterior resection with end colostomy on 4/27  Central access: 5/2 TPN start date: 5/2  Nutritional Goals (per RD recommendation on 5/3): 2200-2400 kcal/day, protein 90-115 g/day, and >2L/day  Goal TPN rate is 90 mL/hr (provides 114 g of protein and 2199 kcals per day)  Current Nutrition:  NPO and TPN  Plan:   Continue TPN at goal 70mL/hr at 1800  Electrolytes in TPN: Na removed, K 20mEq/L, Ca 19mEq/L, Mg 34mEq/L, and Phos 41mmol/L. Cl:Ac max Ac  Add standard MVI and trace elements to TPN  Continue Sensitive q4h SSI and adjust as needed   IV fluids off per Md orders  Monitor TPN labs on Mon/Thurs.  BMET, Mag, Phos daily Friday-Sunday.   Gretta Arab PharmD, BCPS Clinical Pharmacist [facility] main pharmacy [phone] 03/13/2021 7:35 AM

## 2021-03-13 NOTE — Consult Note (Addendum)
Referring Provider:  Jennelle Human, NP Primary Care Physician:  Marrian Salvage, [place] Primary Gastroenterologist:  Dr. Bryan Lemma  Reason for Consultation:  Hematemesis  HPI: Shane [name] is a 70 y.o. 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 Status post chemotherapy and radiation.  Was admitted on 4/27 and underwent anterior resection with end colostomy.  His course was complicated by some acute respiratory distress, AKI, septic shock.  He has been putting out dark brown stool with red tint and surgery was aware.  On 5/3 Hgb decreased to 5s and he received 2 units PRBCs.  Was down to 5.6 grams again this morning.  Has received 2 more units PRBCs and Hgb is up to 7.7 grams.  He had an NGT in place and it was not suctioning any blood. Then he pulled it out and there were multiple attempts to re-insert it yesterday.  Then last night he had a large episode of bloody emesis.  He has been on IV PPI BID.  He is sleepy today and does not answer many questions, drifts off to sleep. His wife and daughter are at bedside.  He was on a small amount of levophed but that has been turned off today.   Past Medical History:  Diagnosis Date  . Allergy   . Arthritis    knee  . Rectal cancer ([name]) 08/2020    Past Surgical History:  Procedure Laterality Date  . IR IMAGING GUIDED PORT INSERTION  07/25/2020  . IR US GUIDE BX ASP/DRAIN  03/11/2021  . OSTOMY N/A 03/05/2021   Procedure: OSTOMY;  Surgeon: Leighton Ruff, MD;  Location: [facility];  Service: General;  Laterality: N/A;  . WISDOM TOOTH EXTRACTION    . XI ROBOTIC ASSISTED LOWER ANTERIOR RESECTION N/A 03/05/2021   Procedure: XI ROBOTIC ASSISTED LOWER ANTERIOR RESECTION WITH MOBILIZATION OF THE SPLENIC FLEXURE OF THE COLON AND END COLOSTOMY;  Surgeon: Leighton Ruff, MD;  Location: [facility];  Service: General;  Laterality: N/A;  6 HOURS    Prior to Admission medications   Medication Sig  Start Date End Date Taking? Authorizing Provider  Diclofenac Sodium 2 % SOLN Place 2 g onto the skin 2 (two) times daily. Patient taking differently: Place 2 g onto the skin daily as needed (pain). 06/30/19  Yes Lyndal Pulley, DO  docusate sodium (COLACE) 100 MG capsule Take 100 mg by mouth daily as needed for mild constipation.    Yes [provider]  HYDROcodone-acetaminophen (NORCO/VICODIN) 5-325 MG tablet Take 1 tablet by mouth every 6 (six) hours as needed for moderate pain. 02/12/21  Yes Owens Shark, NP  hydrocortisone (ANUSOL-HC) 2.5 % rectal cream Place 1 application rectally 2 (two) times daily. Patient taking differently: Place 1 application rectally daily as needed for hemorrhoids or anal itching. 01/28/21  Yes Marrian Salvage, FNP  lidocaine-prilocaine (EMLA) cream Apply 1 application topically as needed. Patient taking differently: Apply 1 application topically as needed (prior to port use). 10/21/20  Yes Ladell Pier, MD  pravastatin (PRAVACHOL) 20 MG tablet TAKE 1 TABLET(20 MG) BY MOUTH DAILY Patient taking differently: Take 20 mg by mouth daily. 11/12/20  Yes Marrian Salvage, FNP  capecitabine (XELODA) 500 MG tablet TAKE 2000 MG (4 TABLETS) EVERY MORNING AND 1500 MG (3 TABLETS) EVERY EVENING FOR A TOTAL DAILY DOSE OF 3500 MG ON RADIATION DAYS ONLY. Patient not taking: No sig reported 11/04/20 11/04/21  Ladell Pier, MD  diphenoxylate-atropine (LOMOTIL) 2.5-0.025 MG tablet Take 1-2 tablets by mouth 3 (three) times daily as needed for diarrhea or loose stools (Maxiumum of 6/day). Patient not taking: No sig reported 12/25/20   Ladell Pier, MD  ondansetron (ZOFRAN) 8 MG tablet Take 1 tablet (8 mg total) by mouth every 8 (eight) hours as needed for nausea or vomiting. Start 72 hours after each IV chemotherapy treatment 07/30/20   Ladell Pier, MD  prochlorperazine (COMPAZINE) 10 MG tablet Take 1 tablet (10 mg total) by mouth every 6 (six) hours as  needed for nausea. 07/30/20   Ladell Pier, MD    Current Facility-Administered Medications  Medication Dose Route Frequency Provider Last Rate Last Admin  . 0.9 %  sodium chloride infusion (Manually program via Guardrails IV Fluids)   Intravenous Once Leighton Ruff, MD   Held at [id number] [phone]  . 0.9 %  sodium chloride infusion   Intravenous PRN Leighton Ruff, MD 10 mL/hr at 03/13/21 1128 Infusion Verify at 03/13/21 1128  . acetaminophen (OFIRMEV) IV 1,000 mg  1,000 mg Intravenous Q6H PRN Ileana Roup, MD   Stopped at 03/13/21 0139  . albuterol (PROVENTIL) (2.5 MG/3ML) 0.083% nebulizer solution 2.5 mg  2.5 mg Nebulization Q6H PRN Michael Boston, MD   2.5 mg at 03/09/21 1651  . alum & mag hydroxide-simeth (MAALOX/MYLANTA) 200-200-20 MG/5ML suspension 30 mL  30 mL Oral [id number] PRN Leighton Ruff, MD      . anidulafungin (ERAXIS) 100 mg in sodium chloride 0.9 % 100 mL IVPB  100 mg Intravenous Q24H Michael Boston, MD   Stopped at 03/13/21 1119  . bacitracin ointment   Topical BID Leighton Ruff, MD   Given at 03/13/21 [phone]  . chlorhexidine (PERIDEX) 0.12 % solution 15 mL  15 mL Mouth Rinse BID Renee Pain, MD   15 mL at 03/13/21 0927  . Chlorhexidine Gluconate Cloth 2 % PADS 6 each  6 each Topical Daily Leighton Ruff, MD   6 each at 03/12/21 0911  . dexmedetomidine (PRECEDEX) 200 MCG/50ML (4 mcg/mL) infusion  0.4-1.2 mcg/kg/hr Intravenous Titrated Brand Males, MD 8.62 mL/hr at 03/13/21 1128 0.4 mcg/kg/hr at 03/13/21 1128  . dextrose 50 % solution 12.5 g  12.5 g Intravenous [id number] PRN Leighton Ruff, MD   [id number] g at 03/09/21 2000  . diphenhydrAMINE (BENADRYL) injection 12.5-25 mg  12.5-25 mg Intravenous Q6H PRN Michael Boston, MD   25 mg at 03/12/21 0413  . enoxaparin (LOVENOX) injection 40 mg  40 mg Subcutaneous Q24H Michael Boston, MD   40 mg at 03/13/21 [id number]  . fentaNYL (SUBLIMAZE) injection 12.5-25 mcg  12.5-25 mcg Intravenous Q2H PRN Brand Males, MD   25 mcg at 03/13/21 0858  .  haloperidol lactate (HALDOL) injection 2-5 mg  2-5 mg Intravenous Q6H PRN Michael Boston, MD   5 mg at 03/11/21 2030  . insulin aspart (novoLOG) injection 0-9 Units  0-9 Units Subcutaneous Q4H Adrian Saran, [facility]   1 Units at 03/13/21 [id number]  . lip balm (CARMEX) ointment 1 application  1 application Topical BID Michael Boston, MD   1 application at [id number] [phone]  . magic mouthwash  15 mL Oral QID PRN Michael Boston, MD      . MEDLINE mouth rinse  15 mL Mouth Rinse q12n4p Renee Pain, MD   15 mL at 03/12/21 1749  . melatonin tablet 3 mg  3 mg Oral QHS Michael Boston, MD      . norepinephrine (LEVOPHED) 4mg  in 244mL premix infusion  0-40 mcg/min Intravenous Titrated Mansy, Jan A, MD 3.75 mL/hr at 03/13/21 1128 1 mcg/min at 03/13/21 1128  . ondansetron (ZOFRAN) tablet 4 mg  4 mg Oral V4B PRN Leighton Ruff, MD       Or  . ondansetron [facility]) injection 4 mg  4 mg Intravenous S4H PRN Leighton Ruff, MD   4 mg at 67/59/16 1306  . pantoprazole (PROTONIX) injection 40 mg  40 mg Intravenous Gorden Harms, MD   40 mg at 03/13/21 0927  . simethicone (MYLICON) chewable tablet 40 mg  40 mg Oral B8G PRN Leighton Ruff, MD   40 mg at 03/05/21 2059  . sodium chloride flush (NS) 0.9 % injection 10-40 mL  10-40 mL Intracatheter [id number] Leighton Ruff, MD   10 mL at 03/13/21 0928  . sodium chloride flush (NS) 0.9 % injection 10-40 mL  10-40 mL Intracatheter PRN Leighton Ruff, MD      . sodium chloride flush (NS) 0.9 % injection 5 mL  5 mL Intracatheter Q8H Sandi Mariscal, MD   5 mL at 03/13/21 0558  . TPN ADULT (ION)   Intravenous Continuous TPN Adrian Saran, RPH 90 mL/hr at 03/13/21 1128 Infusion Verify at 03/13/21 1128  . TPN ADULT (ION)   Intravenous Continuous TPN Shade, Haze Justin, RPH        Allergies as of 01/15/2021  . (No Known Allergies)    Family History  Problem Relation Age of Onset  . Healthy Mother   . Hypertension Father   . Stroke Father   . Colon cancer Neg Hx   . Esophageal cancer  Neg Hx   . Stomach cancer Neg Hx   . Rectal cancer Neg Hx     Social History   Socioeconomic History  . Marital status: Married    Spouse name: Not on file  . Number of children: 2  . Years of education: 35  . Highest education level: Not on file  Occupational History  . Occupation: Armed forces technical officer    Comment: retired  Tobacco Use  . Smoking status: Former Smoker    Packs/day: 1.00    Years: 10.00    Pack years: 10.00    Types: Cigarettes    Quit date: 08/09/1978    Years since quitting: 42.6  . Smokeless tobacco: Never Used  Vaping Use  . Vaping Use: Never used  Substance and Sexual Activity  . Alcohol use: No    Alcohol/week: 0.0 standard drinks  . Drug use: No  . Sexual activity: Not Currently  Other Topics Concern  . Not on file  Social History Narrative   Fun: [name] out with his grandkids, 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   Social Determinants of Radio broadcast assistant Strain: Not on file  Food Insecurity: No Landscape architect  . Worried About Charity fundraiser in the Last Year: Never true  . Ran Out of Food in the Last Year: Never true  Transportation Needs: No Transportation Needs  . Lack of Transportation (Medical): No  . Lack of Transportation (Non-Medical): No  Physical Activity: Not on file  Stress: Not on file  Social Connections: Not on file  Intimate Partner Violence: Not on file    Review of Systems: ROS is O/W negative except as mentioned in HPI.  Physical Exam: Vital signs in last 24 hours: Temp:  [98.2 F (36.8 C)-101.9 F (38.8 C)] 98.4 F (36.9 C) (05/05 0800) Pulse Rate:  [95-113] 100 (05/05 0800) Resp:  [23-43] 33 (  05/05 1145) BP: (80-138)/(16-84) 131/38 (05/05 1145) SpO2:  [93 %-100 %] 99 % (05/05 1145) Weight:  [85.2 kg] 85.2 kg (05/05 0500) Last BM Date: 03/13/21 General:  Alert but sleepy, ill-appearing, pleasant in NAD Head:  Normocephalic and atraumatic. Eyes:  Sclera clear, no icterus.  Conjunctiva pink. Ears:  Normal  auditory acuity. Mouth:  No deformity or lesions.  Dark blood residue coating his tongue, palate and pharynx. Lungs:  Clear throughout to auscultation.  No wheezes, crackles, or rhonchi.  Heart:  Regular rate and rhythm; no murmurs, clicks, rubs, or gallops. Abdomen:  Soft, non-distended.  BS present.  Diffuse TTP.  Ostomy noted as well as dressings on abdomen. Msk:  Symmetrical without gross deformities. Pulses:  Normal pulses noted. Extremities:  Without clubbing or edema. Neurologic:  Alert and oriented;  grossly normal neurologically. Skin:  Intact without significant lesions or rashes.  Intake/Output from previous day: 05/04 0701 - 05/05 0700 In: 3870.3 [I.V.:2666.1; Blood:360.4; IV Piggyback:828.8] Out: 4222 [Urine:1750; Emesis/NG output:550; Drains:162; Stool:1760] Intake/Output this shift: Total I/O In: 728.1 [I.V.:598.1; IV Piggyback:130] Out: 805 [Urine:625; Drains:5; Stool:175]  Lab Results: Recent Labs    03/12/21 0539 03/12/21 0942 03/13/21 0057 03/13/21 1114  WBC 20.6* 21.1* 20.1*  --   HGB 7.5*  7.5* 7.3* 5.6* 7.7*  HCT 21.3*  21.8* 20.9* 16.3* 22.6*  PLT 170 156 155  --    BMET Recent Labs    03/12/21 1840 03/13/21 0517 03/13/21 1005  NA 149* 152* 151*  K 3.5 3.8 3.7  CL 118* 122* 121*  CO2 24 24 24   GLUCOSE 150* 151* 147*  BUN 40* 52* 54*  CREATININE 1.21 1.29* 1.28*  CALCIUM 7.9* 7.9* 7.8*   LFT Recent Labs    03/13/21 0517  PROT 5.4*  ALBUMIN 2.1*  AST 27  ALT 19  ALKPHOS 71  BILITOT 0.7   Studies/Results: CT ABDOMEN PELVIS W CONTRAST  Result Date: 03/11/2021 CLINICAL DATA:  70 year old history of rectal cancer and status post low anterior resection with end colostomy. Postoperative fevers. EXAM: CT ABDOMEN AND PELVIS WITH CONTRAST TECHNIQUE: Multidetector CT imaging of the abdomen and pelvis was performed using the standard protocol following bolus administration of intravenous contrast. CONTRAST:  16mL OMNIPAQUE IOHEXOL 300 MG/ML   SOLN COMPARISON:  CT abdomen and pelvis 10/31/2020 FINDINGS: Lower chest: Bilateral pleural effusions. Pleural effusions are small to moderate in size but incompletely evaluated. Compressive atelectasis in both lower lobes. The distal esophagus is distended. The nasogastric tube is coiled in the stomach and the tip actually extends back into the distal esophagus. Hepatobiliary: Normal appearance of the liver. There is low-density collection along the posterior aspect of the right hepatic lobe which could be in the subcapsular space. This collection measures 5.2 x 4.1 x 7.5 cm. No gross abnormality to the gallbladder. No significant biliary dilatation. Pancreas: Unremarkable. No pancreatic ductal dilatation or surrounding inflammatory changes. Spleen: Normal appearance of the spleen. Adrenals/Urinary Tract: Normal appearance of the adrenal glands. Evidence for cysts in the right kidney. Negative for hydronephrosis. Urinary bladder is decompressed with a Foley catheter. Stomach/Bowel: Patient has a left colostomy with a Hartmann's pouch. There is a small amount of fluid in the residual rectum. Distal esophagus appears to be markedly distended but difficult to exclude wall thickening in the esophagus. Nasogastric tube is coiled in the stomach but the tip actually extends cephalad into the distal esophagus. Dilated fluid-filled loops of small bowel throughout the abdomen. There appears to be diffuse wall thickening involving the  transverse and left colon. Possible bowel wall thickening at the cecum. In addition, there is probably wall thickening involving small bowel in the left upper abdomen. Vascular/Lymphatic: Atherosclerotic calcifications in the abdominal aorta without aneurysm. No significant lymph node enlargement in the abdomen or pelvis. Reproductive: Limited evaluation of the prostate. Large amount of fluid in the scrotum bilaterally. This finding was present on the previous examination and most compatible  with bilateral hydroceles. There is a small amount of gas near the right inguinal canal and not clear if this is within the canal or the subcutaneous tissues. Other: Small to moderate amount of fluid in the left upper abdomen which may represent ascites. Small amount of fluid in the left lower abdomen. There is a small amount of fluid in the pelvis surrounding the surgical drain. There is a right-sided surgical drain that extends into the pelvis and the tip is on the left side of the pelvis. Large amount of subcutaneous edema. Musculoskeletal: No acute bone abnormality. Scattered subcutaneous gas in the abdomen and pelvis. IMPRESSION: 1. Diffuse dilatation of small bowel and colon. In addition, there is evidence for colonic wall thickening and suspect there is small bowel wall thickening in the left upper abdomen. Findings raise concern for colitis and enteritis. There is likely an adynamic ileus. 2. Anasarca with bilateral pleural effusions, ascites and large amount of subcutaneous edema. 3. Scattered intra-abdominal free fluid/ascites. Largest component is in the left upper abdomen and small amount of fluid in the pelvis surround the surgical drain. 4. Focal low-density fluid collection posterior to the right hepatic lobe likely in the subcapsular space. This low-density collection measures up to 7.5 cm. This could represent loculated ascites but indeterminate. 5. Distended esophagus. Nasogastric tube is coiled in the stomach but the tip is actually in the distal esophagus. Consider repositioning of the nasogastric tube. 6. Bilateral hydroceles. These results were called by telephone at the time of interpretation on 03/11/2021 at 4:11 pm to provider [facility] , who verbally acknowledged these results. Electronically Signed   By: Markus Daft M.D.   On: 03/11/2021 16:17   IR US Guide Bx Asp/Drain  Result Date: 03/12/2021 INDICATION: History of rectal cancer post LAR with end colostomy, now with postoperative  abdominal distension and fevers with indeterminate fluid collection within the left upper abdominal quadrant. Please perform ultrasound-guided aspiration and/or drainage catheter placement for infection source control purposes. EXAM: ULTRASOUND-GUIDED PERCUTANEOUS DRAINAGE CATHETER PLACEMENT COMPARISON:  CT abdomen pelvis-03/11/2021 MEDICATIONS: The patient is currently admitted to the hospital and receiving intravenous antibiotics. The antibiotics were administered within an appropriate time frame prior to the initiation of the procedure. ANESTHESIA/SEDATION: None CONTRAST:  None COMPLICATIONS: None immediate. PROCEDURE: Informed written consent was obtained from the patient's wife after a discussion of the risks, benefits and alternatives to treatment. The patient was placed supine on his ICU hospital bed and preprocedural sonographic evaluation was performed of the left upper abdominal quadrant demonstrating small to moderate-sized moderately complex fluid collection compatible with the findings seen on preceding abdominal CT image 26, series 2. the procedure was planned. A timeout was performed prior to the initiation of the procedure. The skin overlying the left upper abdominal quadrant was prepped and draped in the usual sterile fashion. The overlying soft tissues were anesthetized with 1% lidocaine with epinephrine an 18 gauge trocar needle was utilized to access the fluid collection. A small amount of purulent fluid was aspirated the decision was made to proceed with definitive percutaneous drainage catheter placement As such,  a short Amplatz wire was coiled within the collection. Appropriate position was confirmed with sonographic evaluation. Multiple ultrasound images were saved procedural documentation purposes. Next, the track was dilated ultimately allowing placement of a 12 [place] all-purpose drainage catheter. Appropriate positioning was again confirmed with sonographic evaluation and image  documentation Next, approximately 160 cc of purulent fluid was aspirated. The tube was connected to a drainage bag and sutured in place. A dressing was placed. The patient tolerated the procedure well without immediate post procedural complication. IMPRESSION: Successful CT guided placement of a 35 French all purpose drain catheter into the complex fluid collection with the left upper abdominal quadrant with aspiration of 160 mL of purulent fluid. Samples were sent to the laboratory as requested by the ordering clinical team. Electronically Signed   By: Sandi Mariscal M.D.   On: 03/12/2021 07:52   DG CHEST PORT 1 VIEW  Result Date: 03/12/2021 CLINICAL DATA:  Aspiration EXAM: PORTABLE CHEST 1 VIEW COMPARISON:  03/12/2021 FINDINGS: Single frontal view of the chest demonstrates a stable right chest wall port and right-sided PICC. Cardiac silhouette is stable. Persistent bibasilar veiling opacities unchanged since prior exam. No pneumothorax. Pigtail drain partially visualized within the left upper quadrant abdomen. IMPRESSION: 1. Stable bibasilar veiling opacities consistent with consolidation and effusion. Superimposed aspiration would be difficult to exclude. Continued radiographic follow-up is recommended. Electronically Signed   By: Randa Ngo M.D.   On: 03/12/2021 20:20   DG CHEST PORT 1 VIEW  Result Date: 03/12/2021 CLINICAL DATA:  Respiratory distress EXAM: PORTABLE CHEST 1 VIEW COMPARISON:  03/09/2021 FINDINGS: Cardiac shadow is stable. Chest wall port is again noted on the right. Right-sided PICC line is noted in the mid right atrium and could be withdrawn as clinically necessary. Bilateral pleural effusions are again seen and stable. No new focal infiltrate is noted. IMPRESSION: Stable bilateral effusions with underlying atelectatic changes. New right-sided PICC line is in the midportion of the right atrium and could be withdrawn as clinically necessary. Electronically Signed   By: Inez Catalina M.D.    On: 03/12/2021 11:59   DG Abd Portable 2V  Result Date: 03/12/2021 CLINICAL DATA:  Follow-up ileus EXAM: PORTABLE ABDOMEN - 2 VIEW COMPARISON:  03/11/2021 FINDINGS: Paucity of bowel gas is noted. Left-sided end colostomy is seen. Surgical drain is noted deep in the pelvis as well as a percutaneous drain in the upper abdomen on the left. No free air is seen. Small bilateral pleural effusions are noted. IMPRESSION: Paucity of bowel gas consistent with fluid-filled loops of bowel. Electronically Signed   By: Inez Catalina M.D.   On: 03/12/2021 12:01   IMPRESSION:  *Hematemesis:  One large bloody emesis overnight.  Had been having dark stools with blood in ostomy but NGT was not suctioning any blood when in place.  The hematemesis occurred after multiple attempts to re-insert NGT.  ? NGT trauma vs ulcer, etc.  Is on prophylactic Lovenox. *Acute on chronic anemia:  Acutely due to a component of blood loss.  Hgb down to the 5 gram range twice since 5/3, received 2 units of PRBCs each time, so total of 4 units.  Repeat Hgb 7.7 grams post transfusion. *Rectal cancer, underwent anterior resection with end colostomy on 4/27.  Status post chemotherapy and radiation.  PLAN: -Was on IV BID PPI, but will change to PPI gtt for now. -Monitor Hgb and transfuse further prn. -On for EGD tentatively 5/6. -Will check Hpylori Ab. -Will hold tomorrow morning's dose of Lovenox.  Laban Emperor. Coral Timme  03/13/2021, 11:57 AM

## 2021-03-13 NOTE — H&P (View-Only) (Signed)
Referring Provider:  Jennelle Human, NP Primary Care Physician:  Marrian Salvage, [place] Primary Gastroenterologist:  Dr. Bryan Lemma  Reason for Consultation:  Hematemesis  HPI: Shane [name] is a 70 y.o. 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 Status post chemotherapy and radiation.  Was admitted on 4/27 and underwent anterior resection with end colostomy.  His course was complicated by some acute respiratory distress, AKI, septic shock.  He has been putting out dark brown stool with red tint and surgery was aware.  On 5/3 Hgb decreased to 5s and he received 2 units PRBCs.  Was down to 5.6 grams again this morning.  Has received 2 more units PRBCs and Hgb is up to 7.7 grams.  He had an NGT in place and it was not suctioning any blood. Then he pulled it out and there were multiple attempts to re-insert it yesterday.  Then last night he had a large episode of bloody emesis.  He has been on IV PPI BID.  He is sleepy today and does not answer many questions, drifts off to sleep. His wife and daughter are at bedside.  He was on a small amount of levophed but that has been turned off today.   Past Medical History:  Diagnosis Date  . Allergy   . Arthritis    knee  . Rectal cancer ([place]) 08/2020    Past Surgical History:  Procedure Laterality Date  . IR IMAGING GUIDED PORT INSERTION  07/25/2020  . IR US GUIDE BX ASP/DRAIN  03/11/2021  . OSTOMY N/A 03/05/2021   Procedure: OSTOMY;  Surgeon: Leighton Ruff, MD;  Location: [facility];  Service: General;  Laterality: N/A;  . WISDOM TOOTH EXTRACTION    . XI ROBOTIC ASSISTED LOWER ANTERIOR RESECTION N/A 03/05/2021   Procedure: XI ROBOTIC ASSISTED LOWER ANTERIOR RESECTION WITH MOBILIZATION OF THE SPLENIC FLEXURE OF THE COLON AND END COLOSTOMY;  Surgeon: Leighton Ruff, MD;  Location: [facility];  Service: General;  Laterality: N/A;  6 HOURS    Prior to Admission medications   Medication Sig  Start Date End Date Taking? Authorizing Provider  Diclofenac Sodium 2 % SOLN Place 2 g onto the skin 2 (two) times daily. Patient taking differently: Place 2 g onto the skin daily as needed (pain). 06/30/19  Yes Lyndal Pulley, DO  docusate sodium (COLACE) 100 MG capsule Take 100 mg by mouth daily as needed for mild constipation.    Yes [provider]  HYDROcodone-acetaminophen (NORCO/VICODIN) 5-325 MG tablet Take 1 tablet by mouth every 6 (six) hours as needed for moderate pain. 02/12/21  Yes Owens Shark, NP  hydrocortisone (ANUSOL-HC) 2.5 % rectal cream Place 1 application rectally 2 (two) times daily. Patient taking differently: Place 1 application rectally daily as needed for hemorrhoids or anal itching. 01/28/21  Yes Marrian Salvage, FNP  lidocaine-prilocaine (EMLA) cream Apply 1 application topically as needed. Patient taking differently: Apply 1 application topically as needed (prior to port use). 10/21/20  Yes Ladell Pier, MD  pravastatin (PRAVACHOL) 20 MG tablet TAKE 1 TABLET(20 MG) BY MOUTH DAILY Patient taking differently: Take 20 mg by mouth daily. 11/12/20  Yes Marrian Salvage, FNP  capecitabine (XELODA) 500 MG tablet TAKE 2000 MG (4 TABLETS) EVERY MORNING AND 1500 MG (3 TABLETS) EVERY EVENING FOR A TOTAL DAILY DOSE OF 3500 MG ON RADIATION DAYS ONLY. Patient not taking: No sig reported 11/04/20 11/04/21  Ladell Pier, MD  diphenoxylate-atropine (LOMOTIL) 2.5-0.025 MG tablet Take 1-2 tablets by mouth 3 (three) times daily as needed for diarrhea or loose stools (Maxiumum of 6/day). Patient not taking: No sig reported 12/25/20   Ladell Pier, MD  ondansetron (ZOFRAN) 8 MG tablet Take 1 tablet (8 mg total) by mouth every 8 (eight) hours as needed for nausea or vomiting. Start 72 hours after each IV chemotherapy treatment 07/30/20   Ladell Pier, MD  prochlorperazine (COMPAZINE) 10 MG tablet Take 1 tablet (10 mg total) by mouth every 6 (six) hours as  needed for nausea. 07/30/20   Ladell Pier, MD    Current Facility-Administered Medications  Medication Dose Route Frequency Provider Last Rate Last Admin  . 0.9 %  sodium chloride infusion (Manually program via Guardrails IV Fluids)   Intravenous Once Leighton Ruff, MD   Held at [id number] [phone]  . 0.9 %  sodium chloride infusion   Intravenous PRN Leighton Ruff, MD 10 mL/hr at 03/13/21 1128 Infusion Verify at 03/13/21 1128  . acetaminophen (OFIRMEV) IV 1,000 mg  1,000 mg Intravenous Q6H PRN Ileana Roup, MD   Stopped at 03/13/21 0139  . albuterol (PROVENTIL) (2.5 MG/3ML) 0.083% nebulizer solution 2.5 mg  2.5 mg Nebulization Q6H PRN Michael Boston, MD   2.5 mg at 03/09/21 1651  . alum & mag hydroxide-simeth (MAALOX/MYLANTA) 200-200-20 MG/5ML suspension 30 mL  30 mL Oral [id number] PRN Leighton Ruff, MD      . anidulafungin (ERAXIS) 100 mg in sodium chloride 0.9 % 100 mL IVPB  100 mg Intravenous Q24H Michael Boston, MD   Stopped at 03/13/21 1119  . bacitracin ointment   Topical BID Leighton Ruff, MD   Given at 03/13/21 [phone]  . chlorhexidine (PERIDEX) 0.12 % solution 15 mL  15 mL Mouth Rinse BID Renee Pain, MD   15 mL at 03/13/21 0927  . Chlorhexidine Gluconate Cloth 2 % PADS 6 each  6 each Topical Daily Leighton Ruff, MD   6 each at 03/12/21 0911  . dexmedetomidine (PRECEDEX) 200 MCG/50ML (4 mcg/mL) infusion  0.4-1.2 mcg/kg/hr Intravenous Titrated Brand Males, MD 8.62 mL/hr at 03/13/21 1128 0.4 mcg/kg/hr at 03/13/21 1128  . dextrose 50 % solution 12.5 g  12.5 g Intravenous [id number] PRN Leighton Ruff, MD   [id number] g at 03/09/21 2000  . diphenhydrAMINE (BENADRYL) injection 12.5-25 mg  12.5-25 mg Intravenous Q6H PRN Michael Boston, MD   25 mg at 03/12/21 0413  . enoxaparin (LOVENOX) injection 40 mg  40 mg Subcutaneous Q24H Michael Boston, MD   40 mg at 03/13/21 [id number]  . fentaNYL (SUBLIMAZE) injection 12.5-25 mcg  12.5-25 mcg Intravenous Q2H PRN Brand Males, MD   25 mcg at 03/13/21 0858  .  haloperidol lactate (HALDOL) injection 2-5 mg  2-5 mg Intravenous Q6H PRN Michael Boston, MD   5 mg at 03/11/21 2030  . insulin aspart (novoLOG) injection 0-9 Units  0-9 Units Subcutaneous Q4H Adrian Saran, [facility]   1 Units at 03/13/21 [id number]  . lip balm (CARMEX) ointment 1 application  1 application Topical BID Michael Boston, MD   1 application at [id number] [phone]  . magic mouthwash  15 mL Oral QID PRN Michael Boston, MD      . MEDLINE mouth rinse  15 mL Mouth Rinse q12n4p Renee Pain, MD   15 mL at 03/12/21 1749  . melatonin tablet 3 mg  3 mg Oral QHS Michael Boston, MD      . norepinephrine (LEVOPHED) 4mg  in 21mL premix infusion  0-40 mcg/min Intravenous Titrated Mansy, Jan A, MD 3.75 mL/hr at 03/13/21 1128 1 mcg/min at 03/13/21 1128  . ondansetron (ZOFRAN) tablet 4 mg  4 mg Oral V4B PRN Leighton Ruff, MD       Or  . ondansetron [facility]) injection 4 mg  4 mg Intravenous S4H PRN Leighton Ruff, MD   4 mg at 67/59/16 1306  . pantoprazole (PROTONIX) injection 40 mg  40 mg Intravenous Gorden Harms, MD   40 mg at 03/13/21 0927  . simethicone (MYLICON) chewable tablet 40 mg  40 mg Oral B8G PRN Leighton Ruff, MD   40 mg at 03/05/21 2059  . sodium chloride flush (NS) 0.9 % injection 10-40 mL  10-40 mL Intracatheter [id number] Leighton Ruff, MD   10 mL at 03/13/21 0928  . sodium chloride flush (NS) 0.9 % injection 10-40 mL  10-40 mL Intracatheter PRN Leighton Ruff, MD      . sodium chloride flush (NS) 0.9 % injection 5 mL  5 mL Intracatheter Q8H Sandi Mariscal, MD   5 mL at 03/13/21 0558  . TPN ADULT (ION)   Intravenous Continuous TPN Adrian Saran, RPH 90 mL/hr at 03/13/21 1128 Infusion Verify at 03/13/21 1128  . TPN ADULT (ION)   Intravenous Continuous TPN Shade, Haze Justin, RPH        Allergies as of 01/15/2021  . (No Known Allergies)    Family History  Problem Relation Age of Onset  . Healthy Mother   . Hypertension Father   . Stroke Father   . Colon cancer Neg Hx   . Esophageal cancer  Neg Hx   . Stomach cancer Neg Hx   . Rectal cancer Neg Hx     Social History   Socioeconomic History  . Marital status: Married    Spouse name: Not on file  . Number of children: 2  . Years of education: 35  . Highest education level: Not on file  Occupational History  . Occupation: Armed forces technical officer    Comment: retired  Tobacco Use  . Smoking status: Former Smoker    Packs/day: 1.00    Years: 10.00    Pack years: 10.00    Types: Cigarettes    Quit date: 08/09/1978    Years since quitting: 42.6  . Smokeless tobacco: Never Used  Vaping Use  . Vaping Use: Never used  Substance and Sexual Activity  . Alcohol use: No    Alcohol/week: 0.0 standard drinks  . Drug use: No  . Sexual activity: Not Currently  Other Topics Concern  . Not on file  Social History Narrative   Fun: [name] out with his grandkids, 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   Social Determinants of Radio broadcast assistant Strain: Not on file  Food Insecurity: No Landscape architect  . Worried About Charity fundraiser in the Last Year: Never true  . Ran Out of Food in the Last Year: Never true  Transportation Needs: No Transportation Needs  . Lack of Transportation (Medical): No  . Lack of Transportation (Non-Medical): No  Physical Activity: Not on file  Stress: Not on file  Social Connections: Not on file  Intimate Partner Violence: Not on file    Review of Systems: ROS is O/W negative except as mentioned in HPI.  Physical Exam: Vital signs in last 24 hours: Temp:  [98.2 F (36.8 C)-101.9 F (38.8 C)] 98.4 F (36.9 C) (05/05 0800) Pulse Rate:  [95-113] 100 (05/05 0800) Resp:  [23-43] 33 (  05/05 1145) BP: (80-138)/(16-84) 131/38 (05/05 1145) SpO2:  [93 %-100 %] 99 % (05/05 1145) Weight:  [85.2 kg] 85.2 kg (05/05 0500) Last BM Date: 03/13/21 General:  Alert but sleepy, ill-appearing, pleasant in NAD Head:  Normocephalic and atraumatic. Eyes:  Sclera clear, no icterus.  Conjunctiva pink. Ears:  Normal  auditory acuity. Mouth:  No deformity or lesions.  Dark blood residue coating his tongue, palate and pharynx. Lungs:  Clear throughout to auscultation.  No wheezes, crackles, or rhonchi.  Heart:  Regular rate and rhythm; no murmurs, clicks, rubs, or gallops. Abdomen:  Soft, non-distended.  BS present.  Diffuse TTP.  Ostomy noted as well as dressings on abdomen. Msk:  Symmetrical without gross deformities. Pulses:  Normal pulses noted. Extremities:  Without clubbing or edema. Neurologic:  Alert and oriented;  grossly normal neurologically. Skin:  Intact without significant lesions or rashes.  Intake/Output from previous day: 05/04 0701 - 05/05 0700 In: 3870.3 [I.V.:2666.1; Blood:360.4; IV Piggyback:828.8] Out: 4222 [Urine:1750; Emesis/NG output:550; Drains:162; Stool:1760] Intake/Output this shift: Total I/O In: 728.1 [I.V.:598.1; IV Piggyback:130] Out: 805 [Urine:625; Drains:5; Stool:175]  Lab Results: Recent Labs    03/12/21 0539 03/12/21 0942 03/13/21 0057 03/13/21 1114  WBC 20.6* 21.1* 20.1*  --   HGB 7.5*  7.5* 7.3* 5.6* 7.7*  HCT 21.3*  21.8* 20.9* 16.3* 22.6*  PLT 170 156 155  --    BMET Recent Labs    03/12/21 1840 03/13/21 0517 03/13/21 1005  NA 149* 152* 151*  K 3.5 3.8 3.7  CL 118* 122* 121*  CO2 24 24 24   GLUCOSE 150* 151* 147*  BUN 40* 52* 54*  CREATININE 1.21 1.29* 1.28*  CALCIUM 7.9* 7.9* 7.8*   LFT Recent Labs    03/13/21 0517  PROT 5.4*  ALBUMIN 2.1*  AST 27  ALT 19  ALKPHOS 71  BILITOT 0.7   Studies/Results: CT ABDOMEN PELVIS W CONTRAST  Result Date: 03/11/2021 CLINICAL DATA:  70 year old history of rectal cancer and status post low anterior resection with end colostomy. Postoperative fevers. EXAM: CT ABDOMEN AND PELVIS WITH CONTRAST TECHNIQUE: Multidetector CT imaging of the abdomen and pelvis was performed using the standard protocol following bolus administration of intravenous contrast. CONTRAST:  169mL OMNIPAQUE IOHEXOL 300 MG/ML   SOLN COMPARISON:  CT abdomen and pelvis 10/31/2020 FINDINGS: Lower chest: Bilateral pleural effusions. Pleural effusions are small to moderate in size but incompletely evaluated. Compressive atelectasis in both lower lobes. The distal esophagus is distended. The nasogastric tube is coiled in the stomach and the tip actually extends back into the distal esophagus. Hepatobiliary: Normal appearance of the liver. There is low-density collection along the posterior aspect of the right hepatic lobe which could be in the subcapsular space. This collection measures 5.2 x 4.1 x 7.5 cm. No gross abnormality to the gallbladder. No significant biliary dilatation. Pancreas: Unremarkable. No pancreatic ductal dilatation or surrounding inflammatory changes. Spleen: Normal appearance of the spleen. Adrenals/Urinary Tract: Normal appearance of the adrenal glands. Evidence for cysts in the right kidney. Negative for hydronephrosis. Urinary bladder is decompressed with a Foley catheter. Stomach/Bowel: Patient has a left colostomy with a Hartmann's pouch. There is a small amount of fluid in the residual rectum. Distal esophagus appears to be markedly distended but difficult to exclude wall thickening in the esophagus. Nasogastric tube is coiled in the stomach but the tip actually extends cephalad into the distal esophagus. Dilated fluid-filled loops of small bowel throughout the abdomen. There appears to be diffuse wall thickening involving the  transverse and left colon. Possible bowel wall thickening at the cecum. In addition, there is probably wall thickening involving small bowel in the left upper abdomen. Vascular/Lymphatic: Atherosclerotic calcifications in the abdominal aorta without aneurysm. No significant lymph node enlargement in the abdomen or pelvis. Reproductive: Limited evaluation of the prostate. Large amount of fluid in the scrotum bilaterally. This finding was present on the previous examination and most compatible  with bilateral hydroceles. There is a small amount of gas near the right inguinal canal and not clear if this is within the canal or the subcutaneous tissues. Other: Small to moderate amount of fluid in the left upper abdomen which may represent ascites. Small amount of fluid in the left lower abdomen. There is a small amount of fluid in the pelvis surrounding the surgical drain. There is a right-sided surgical drain that extends into the pelvis and the tip is on the left side of the pelvis. Large amount of subcutaneous edema. Musculoskeletal: No acute bone abnormality. Scattered subcutaneous gas in the abdomen and pelvis. IMPRESSION: 1. Diffuse dilatation of small bowel and colon. In addition, there is evidence for colonic wall thickening and suspect there is small bowel wall thickening in the left upper abdomen. Findings raise concern for colitis and enteritis. There is likely an adynamic ileus. 2. Anasarca with bilateral pleural effusions, ascites and large amount of subcutaneous edema. 3. Scattered intra-abdominal free fluid/ascites. Largest component is in the left upper abdomen and small amount of fluid in the pelvis surround the surgical drain. 4. Focal low-density fluid collection posterior to the right hepatic lobe likely in the subcapsular space. This low-density collection measures up to 7.5 cm. This could represent loculated ascites but indeterminate. 5. Distended esophagus. Nasogastric tube is coiled in the stomach but the tip is actually in the distal esophagus. Consider repositioning of the nasogastric tube. 6. Bilateral hydroceles. These results were called by telephone at the time of interpretation on 03/11/2021 at 4:11 pm to provider [facility] , who verbally acknowledged these results. Electronically Signed   By: Markus Daft M.D.   On: 03/11/2021 16:17   IR US Guide Bx Asp/Drain  Result Date: 03/12/2021 INDICATION: History of rectal cancer post LAR with end colostomy, now with postoperative  abdominal distension and fevers with indeterminate fluid collection within the left upper abdominal quadrant. Please perform ultrasound-guided aspiration and/or drainage catheter placement for infection source control purposes. EXAM: ULTRASOUND-GUIDED PERCUTANEOUS DRAINAGE CATHETER PLACEMENT COMPARISON:  CT abdomen pelvis-03/11/2021 MEDICATIONS: The patient is currently admitted to the hospital and receiving intravenous antibiotics. The antibiotics were administered within an appropriate time frame prior to the initiation of the procedure. ANESTHESIA/SEDATION: None CONTRAST:  None COMPLICATIONS: None immediate. PROCEDURE: Informed written consent was obtained from the patient's wife after a discussion of the risks, benefits and alternatives to treatment. The patient was placed supine on his ICU hospital bed and preprocedural sonographic evaluation was performed of the left upper abdominal quadrant demonstrating small to moderate-sized moderately complex fluid collection compatible with the findings seen on preceding abdominal CT image 26, series 2. the procedure was planned. A timeout was performed prior to the initiation of the procedure. The skin overlying the left upper abdominal quadrant was prepped and draped in the usual sterile fashion. The overlying soft tissues were anesthetized with 1% lidocaine with epinephrine an 18 gauge trocar needle was utilized to access the fluid collection. A small amount of purulent fluid was aspirated the decision was made to proceed with definitive percutaneous drainage catheter placement As such,  a short Amplatz wire was coiled within the collection. Appropriate position was confirmed with sonographic evaluation. Multiple ultrasound images were saved procedural documentation purposes. Next, the track was dilated ultimately allowing placement of a 12 [place] all-purpose drainage catheter. Appropriate positioning was again confirmed with sonographic evaluation and image  documentation Next, approximately 160 cc of purulent fluid was aspirated. The tube was connected to a drainage bag and sutured in place. A dressing was placed. The patient tolerated the procedure well without immediate post procedural complication. IMPRESSION: Successful CT guided placement of a 35 French all purpose drain catheter into the complex fluid collection with the left upper abdominal quadrant with aspiration of 160 mL of purulent fluid. Samples were sent to the laboratory as requested by the ordering clinical team. Electronically Signed   By: Sandi Mariscal M.D.   On: 03/12/2021 07:52   DG CHEST PORT 1 VIEW  Result Date: 03/12/2021 CLINICAL DATA:  Aspiration EXAM: PORTABLE CHEST 1 VIEW COMPARISON:  03/12/2021 FINDINGS: Single frontal view of the chest demonstrates a stable right chest wall port and right-sided PICC. Cardiac silhouette is stable. Persistent bibasilar veiling opacities unchanged since prior exam. No pneumothorax. Pigtail drain partially visualized within the left upper quadrant abdomen. IMPRESSION: 1. Stable bibasilar veiling opacities consistent with consolidation and effusion. Superimposed aspiration would be difficult to exclude. Continued radiographic follow-up is recommended. Electronically Signed   By: Randa Ngo M.D.   On: 03/12/2021 20:20   DG CHEST PORT 1 VIEW  Result Date: 03/12/2021 CLINICAL DATA:  Respiratory distress EXAM: PORTABLE CHEST 1 VIEW COMPARISON:  03/09/2021 FINDINGS: Cardiac shadow is stable. Chest wall port is again noted on the right. Right-sided PICC line is noted in the mid right atrium and could be withdrawn as clinically necessary. Bilateral pleural effusions are again seen and stable. No new focal infiltrate is noted. IMPRESSION: Stable bilateral effusions with underlying atelectatic changes. New right-sided PICC line is in the midportion of the right atrium and could be withdrawn as clinically necessary. Electronically Signed   By: Inez Catalina M.D.    On: 03/12/2021 11:59   DG Abd Portable 2V  Result Date: 03/12/2021 CLINICAL DATA:  Follow-up ileus EXAM: PORTABLE ABDOMEN - 2 VIEW COMPARISON:  03/11/2021 FINDINGS: Paucity of bowel gas is noted. Left-sided end colostomy is seen. Surgical drain is noted deep in the pelvis as well as a percutaneous drain in the upper abdomen on the left. No free air is seen. Small bilateral pleural effusions are noted. IMPRESSION: Paucity of bowel gas consistent with fluid-filled loops of bowel. Electronically Signed   By: Inez Catalina M.D.   On: 03/12/2021 12:01   IMPRESSION:  *Hematemesis:  One large bloody emesis overnight.  Had been having dark stools with blood in ostomy but NGT was not suctioning any blood when in place.  The hematemesis occurred after multiple attempts to re-insert NGT.  ? NGT trauma vs ulcer, etc.  Is on prophylactic Lovenox. *Acute on chronic anemia:  Acutely due to a component of blood loss.  Hgb down to the 5 gram range twice since 5/3, received 2 units of PRBCs each time, so total of 4 units.  Repeat Hgb 7.7 grams post transfusion. *Rectal cancer, underwent anterior resection with end colostomy on 4/27.  Status post chemotherapy and radiation.  PLAN: -Was on IV BID PPI, but will change to PPI gtt for now. -Monitor Hgb and transfuse further prn. -On for EGD tentatively 5/6. -Will check Hpylori Ab. -Will hold tomorrow morning's dose of Lovenox.  Laban Emperor. Atari Novick  03/13/2021, 11:57 AM

## 2021-03-13 NOTE — Progress Notes (Signed)
Date and time results received: 03/13/21 0135  Test: CBC Critical Value: HGB 5.6  Name of Provider Notified: [place] PAGER  Orders Received? Or Actions Taken?: Waiting for response

## 2021-03-13 NOTE — Progress Notes (Signed)
NAME:  Shane [name], MRN:  [id number], DOB:  [birth year], LOS: 8 ADMISSION DATE:  03/05/2021, CONSULTATION DATE:  03/13/2021  REFERRING MD:  Milana Kidney, CHIEF COMPLAINT:  resp distress   History of Present Illness:  70 year old man presented with rectal bleeding, found to have rectal cancer, underwent anterior resection with end colostomy on 4/27.  Postop course complicated by ileus and AKI. NGT was attempted today but could not be placed. Creatinine has plateaued, given Lasix 5/1 Developed confusion overnight, pain medications were switched from Dilaudid to fentanyl with some improvement .  Venous Dopplers were negative for DVT 5/1 He developed increasing respiratory distress and tachypnea, hence PCCM consulted although oxygen requirements have not increased Febrile 100.7  He is status post radiation and Xeloda 01/03/2021  Pertinent  Medical History    Significant Hospital Events: Including procedures, antibiotic start and stop dates in addition to other pertinent events   . QTC 411 on 03/06/21 . Overnight 5/2, Hgb drop to 5.2.  2u PRBC ordered and started on Levophed for hypotension. START TPN, RUE PICC Placed  . 03/11/21 : Hgb drop to 5.2 .  Hypotensive overnight, now on low dose levo, BP adequate. Awaiting 2u PRBC transfusion this AM. Surgery planning for CT scan abd / pelv. Continue zosyn. Start Eraxis.  . 5/4 pathology c/w rectal ca, tarted on blood and vasopressors yesterday.  CT scan yesterday showed left upper quadrant intrasplenic and left flank fluid.  Yesterday status post CT-guided placement of 10 French catheter drainage of the left upper abdominal quadrant yielding 160 cc of purulent appearing fluid culture sent -> later in the evening shock was improving and AKI improving.  Currently on RA 97%.  On low-dose Levophed.  On antibiotics.  Still febrile.  Has foley since admit.  According to bedside nurse mostly oriented but intermittently delirious but also complains of nonspecific  pain diffusely.  Also sensitive to touch in terms of pain.  On Vicodin at home as needed.  Pulled NGT out, unable to be replaced.   Interim History / Subjective:   Unable to replace NGT since yesterday, one large bright red bloody emesis overnight, remains on IV PPI BID S/p 2 u PRBC for Hgb 7.3-> 5.6, pending repeat H/H tmax 101.9 Down to NE 1 mcg, weaning Remains on dex 0.4 and confused, c/o of pain all over, denies SOB Remains on room air  UOP 1.7L /24 hr   Objective   Blood pressure (!) 124/40, pulse 100, temperature 98.4 F (36.9 C), temperature source Axillary, resp. rate (!) 32, weight 85.2 kg, SpO2 99 %.        Intake/Output Summary (Last 24 hours) at 03/13/2021 1027 Last data filed at 03/13/2021 0947 Gross per 24 hour  Intake 3840.79 ml  Output 3722 ml  Net 118.79 ml   Filed Weights   03/10/21 0500 03/12/21 0446 03/13/21 0500  Weight: 76.2 kg 86.2 kg 85.2 kg    Examination:  General:  Frail elderly male lying in bed in NAD HEENT: MM pale, coffee ground/ residual blood on tongue, pupils 3/reactive Neuro: awake, oriented to self / place, confused to date, MAE- generalized weakness CV: SR/ borderline ST, distant heart sounds, unaccessed port R chest, RUE PICC PULM:  Some audible upper airway rhonchi, better after cough which is weak, no sputum production, coarse, rhonchi in LUL GI: mildly diffusely tender, hypo BS, ostomy pink with dark bloody drainage, R sided drain and LUQ drain with purulent drainage Extremities: warm/dry, +1 LE edema  Skin: no  rashes    Labs/imaging that I havepersonally reviewed  (right click and "Reselect all SmartList Selections" daily)  Chest x-ray 5/4 shows bibasal infiltrates and effusions. No CXR today   No BMET CBC-> Hgb 7.3-> 5.6, WBC 21.1-> 20.1   4/29 MRSA neg 5/1 UC >> neg 5/1 BCx >>  5/3 LUQ drain cx >>  4/27 cefotetan 4/29 zoysn >> 5/4 5/3 anidulafungin >>   Resolved Hospital Problem list     Assessment & Plan:    Anemia/ ABLA   - 03/11/21: has had melena and coffee ground output from NGT.  ? Gastritis vs other upper GI.  2u PRBC ordered, starting transfusion shortly. And s/p - Transfuse 2u PRBC.  lovenox held -03/12/2021: No active bleeding.  Pressor requirement is low but has not responded adequately to the 2 units of PRBC from yesterday - 5/5 large bloody emesis overnight, Hgb 7.3-> 5.6 s/p 2 units PRBC Plan - pending repeat H/H - spoke w/ Legrand Como, [place] surgery given concern for UGIB 2/2 trauma from NGT vs UGIB, they are ok w/GI consult, pending return call from [facility]  Shock- multifactorial, initially thought to be septic, now likely septic (given purulent LUQ drainage, possibly fungal, and suspected ABLA/ hemorraghic given ongoing anemia and bloody emesis - still requiring low dose NE, goal MAP > 65 - transfuse for Hgb > 7 - s/p 5 days of zosyn, remains on Eraxis  - follow culture data  - ongoing fever/ leukocytosis despite abx, consider aspiration given emesis, LE dopplers 5/1 neg for DVT    Acute respiratory distress - this appeared to be related to ileus and bibasilar atelectasis.  Bilateral pleural effusion noted as well 5/4 - ongoing tachypnea likely 2/2 abd process/ SIRS/ and possible aspiration from vomiting - remains on room air, no O2 needs which is reassuring - encourage ongoing aggressive pulm hygiene / IS, mobilize as able  - intermittent CXR as needed   Ileus. - Continue TPN since 03/10/2021 - per [facility]   AKI -  03/12/2021: creatinine has plateaued and he has decent urine output so expect this to turn around eventually. 5/5, great UOP Plan  - BMET in am  - Continue supportive care. - Avoid contrast and other nephrotoxins.   Hypokalemia. Plan BMET in am    Delirium - likely ICU delirium. Plan - continue low dose precedex prn  - prn fentanyl for pain  - delirium precautions    Rectal CA - s/p resection with end colostomy. - Per surgery. - F/u with oncology.   LABS     PULMONARY Recent Labs  Lab 03/08/21 1313 03/11/21 0424  PHART 7.362 7.563*  PCO2ART 26.4* 32.0  PO2ART 93.2 52.8*  HCO3 14.6* 29.0*  O2SAT 96.7 88.4    CBC Recent Labs  Lab 03/12/21 0539 03/12/21 0942 03/13/21 0057  HGB 7.5*  7.5* 7.3* 5.6*  HCT 21.3*  21.8* 20.9* 16.3*  WBC 20.6* 21.1* 20.1*  PLT 170 156 155    COAGULATION No results for input(s): INR in the last 168 hours.  CARDIAC  No results for input(s): TROPONINI in the last 168 hours. No results for input(s): PROBNP in the last 168 hours.   CHEMISTRY Recent Labs  Lab 03/08/21 0520 03/09/21 0211 03/10/21 0233 03/11/21 0231 03/11/21 1738 03/12/21 0539 03/12/21 1840  NA 137 141 144 147*  --  148* 149*  K 4.2 3.8 3.2* 3.2*   < > 3.3* 3.5  CL 111 112* 110 111  --  114* 118*  CO2 17* 18* 23 27  --  26 24  GLUCOSE 99 90 105* 140*  --  128* 150*  BUN 49* 60* 61* 58*  --  39* 40*  CREATININE 2.97* 2.93* 2.10* 1.48*  --  1.40* 1.21  CALCIUM 8.3* 8.7* 8.6* 8.1*  --  8.1* 7.9*  MG 1.8 1.8 2.2 2.2  --  2.2  --   PHOS 3.9  --   --  1.7*  --  3.2  --    < > = values in this interval not displayed.   Estimated Creatinine Clearance: 57.8 mL/min (by C-G formula based on SCr of 1.21 mg/dL).   LIVER Recent Labs  Lab 03/08/21 0520 03/09/21 0211 03/11/21 0231  AST 41 35 30  ALT 30 30 22   ALKPHOS 62 41 46  BILITOT 1.1 0.9 1.2  PROT 5.4* 5.8* 5.1*  ALBUMIN 2.5* 2.7* 2.2*     INFECTIOUS Recent Labs  Lab 03/11/21 0450 03/11/21 0809 03/12/21 0942  LATICACIDVEN 2.3* 2.9* 1.7  PROCALCITON  --   --  13.73     ENDOCRINE CBG (last 3)  Recent Labs    03/13/21 0026 03/13/21 0417 03/13/21 0737  GLUCAP 128* 137* 137*         IMAGING x48h  - image(s) personally visualized  -   highlighted in bold CT ABDOMEN PELVIS W CONTRAST  Result Date: 03/11/2021 CLINICAL DATA:  70 year old history of rectal cancer and status post low anterior resection with end colostomy. Postoperative fevers. EXAM: CT  ABDOMEN AND PELVIS WITH CONTRAST TECHNIQUE: Multidetector CT imaging of the abdomen and pelvis was performed using the standard protocol following bolus administration of intravenous contrast. CONTRAST:  135mL OMNIPAQUE IOHEXOL 300 MG/ML  SOLN COMPARISON:  CT abdomen and pelvis 10/31/2020 FINDINGS: Lower chest: Bilateral pleural effusions. Pleural effusions are small to moderate in size but incompletely evaluated. Compressive atelectasis in both lower lobes. The distal esophagus is distended. The nasogastric tube is coiled in the stomach and the tip actually extends back into the distal esophagus. Hepatobiliary: Normal appearance of the liver. There is low-density collection along the posterior aspect of the right hepatic lobe which could be in the subcapsular space. This collection measures 5.2 x 4.1 x 7.5 cm. No gross abnormality to the gallbladder. No significant biliary dilatation. Pancreas: Unremarkable. No pancreatic ductal dilatation or surrounding inflammatory changes. Spleen: Normal appearance of the spleen. Adrenals/Urinary Tract: Normal appearance of the adrenal glands. Evidence for cysts in the right kidney. Negative for hydronephrosis. Urinary bladder is decompressed with a Foley catheter. Stomach/Bowel: Patient has a left colostomy with a Hartmann's pouch. There is a small amount of fluid in the residual rectum. Distal esophagus appears to be markedly distended but difficult to exclude wall thickening in the esophagus. Nasogastric tube is coiled in the stomach but the tip actually extends cephalad into the distal esophagus. Dilated fluid-filled loops of small bowel throughout the abdomen. There appears to be diffuse wall thickening involving the transverse and left colon. Possible bowel wall thickening at the cecum. In addition, there is probably wall thickening involving small bowel in the left upper abdomen. Vascular/Lymphatic: Atherosclerotic calcifications in the abdominal aorta without aneurysm.  No significant lymph node enlargement in the abdomen or pelvis. Reproductive: Limited evaluation of the prostate. Large amount of fluid in the scrotum bilaterally. This finding was present on the previous examination and most compatible with bilateral hydroceles. There is a small amount of gas near the right inguinal canal and not clear if this is  within the canal or the subcutaneous tissues. Other: Small to moderate amount of fluid in the left upper abdomen which may represent ascites. Small amount of fluid in the left lower abdomen. There is a small amount of fluid in the pelvis surrounding the surgical drain. There is a right-sided surgical drain that extends into the pelvis and the tip is on the left side of the pelvis. Large amount of subcutaneous edema. Musculoskeletal: No acute bone abnormality. Scattered subcutaneous gas in the abdomen and pelvis. IMPRESSION: 1. Diffuse dilatation of small bowel and colon. In addition, there is evidence for colonic wall thickening and suspect there is small bowel wall thickening in the left upper abdomen. Findings raise concern for colitis and enteritis. There is likely an adynamic ileus. 2. Anasarca with bilateral pleural effusions, ascites and large amount of subcutaneous edema. 3. Scattered intra-abdominal free fluid/ascites. Largest component is in the left upper abdomen and small amount of fluid in the pelvis surround the surgical drain. 4. Focal low-density fluid collection posterior to the right hepatic lobe likely in the subcapsular space. This low-density collection measures up to 7.5 cm. This could represent loculated ascites but indeterminate. 5. Distended esophagus. Nasogastric tube is coiled in the stomach but the tip is actually in the distal esophagus. Consider repositioning of the nasogastric tube. 6. Bilateral hydroceles. These results were called by telephone at the time of interpretation on 03/11/2021 at 4:11 pm to provider [facility] , who verbally  acknowledged these results. Electronically Signed   By: Markus Daft M.D.   On: 03/11/2021 16:17   IR US Guide Bx Asp/Drain  Result Date: 03/12/2021 INDICATION: History of rectal cancer post LAR with end colostomy, now with postoperative abdominal distension and fevers with indeterminate fluid collection within the left upper abdominal quadrant. Please perform ultrasound-guided aspiration and/or drainage catheter placement for infection source control purposes. EXAM: ULTRASOUND-GUIDED PERCUTANEOUS DRAINAGE CATHETER PLACEMENT COMPARISON:  CT abdomen pelvis-03/11/2021 MEDICATIONS: The patient is currently admitted to the hospital and receiving intravenous antibiotics. The antibiotics were administered within an appropriate time frame prior to the initiation of the procedure. ANESTHESIA/SEDATION: None CONTRAST:  None COMPLICATIONS: None immediate. PROCEDURE: Informed written consent was obtained from the patient's wife after a discussion of the risks, benefits and alternatives to treatment. The patient was placed supine on his ICU hospital bed and preprocedural sonographic evaluation was performed of the left upper abdominal quadrant demonstrating small to moderate-sized moderately complex fluid collection compatible with the findings seen on preceding abdominal CT image 26, series 2. the procedure was planned. A timeout was performed prior to the initiation of the procedure. The skin overlying the left upper abdominal quadrant was prepped and draped in the usual sterile fashion. The overlying soft tissues were anesthetized with 1% lidocaine with epinephrine an 18 gauge trocar needle was utilized to access the fluid collection. A small amount of purulent fluid was aspirated the decision was made to proceed with definitive percutaneous drainage catheter placement As such, a short Amplatz wire was coiled within the collection. Appropriate position was confirmed with sonographic evaluation. Multiple ultrasound images  were saved procedural documentation purposes. Next, the track was dilated ultimately allowing placement of a 12 [place] all-purpose drainage catheter. Appropriate positioning was again confirmed with sonographic evaluation and image documentation Next, approximately 160 cc of purulent fluid was aspirated. The tube was connected to a drainage bag and sutured in place. A dressing was placed. The patient tolerated the procedure well without immediate post procedural complication. IMPRESSION: Successful CT guided placement  of a 62 [place] all purpose drain catheter into the complex fluid collection with the left upper abdominal quadrant with aspiration of 160 mL of purulent fluid. Samples were sent to the laboratory as requested by the ordering clinical team. Electronically Signed   By: Sandi Mariscal M.D.   On: 03/12/2021 07:52   DG CHEST PORT 1 VIEW  Result Date: 03/12/2021 CLINICAL DATA:  Aspiration EXAM: PORTABLE CHEST 1 VIEW COMPARISON:  03/12/2021 FINDINGS: Single frontal view of the chest demonstrates a stable right chest wall port and right-sided PICC. Cardiac silhouette is stable. Persistent bibasilar veiling opacities unchanged since prior exam. No pneumothorax. Pigtail drain partially visualized within the left upper quadrant abdomen. IMPRESSION: 1. Stable bibasilar veiling opacities consistent with consolidation and effusion. Superimposed aspiration would be difficult to exclude. Continued radiographic follow-up is recommended. Electronically Signed   By: Randa Ngo M.D.   On: 03/12/2021 20:20   DG CHEST PORT 1 VIEW  Result Date: 03/12/2021 CLINICAL DATA:  Respiratory distress EXAM: PORTABLE CHEST 1 VIEW COMPARISON:  03/09/2021 FINDINGS: Cardiac shadow is stable. Chest wall port is again noted on the right. Right-sided PICC line is noted in the mid right atrium and could be withdrawn as clinically necessary. Bilateral pleural effusions are again seen and stable. No new focal infiltrate is noted.  IMPRESSION: Stable bilateral effusions with underlying atelectatic changes. New right-sided PICC line is in the midportion of the right atrium and could be withdrawn as clinically necessary. Electronically Signed   By: Inez Catalina M.D.   On: 03/12/2021 11:59   DG Abd Portable 2V  Result Date: 03/12/2021 CLINICAL DATA:  Follow-up ileus EXAM: PORTABLE ABDOMEN - 2 VIEW COMPARISON:  03/11/2021 FINDINGS: Paucity of bowel gas is noted. Left-sided end colostomy is seen. Surgical drain is noted deep in the pelvis as well as a percutaneous drain in the upper abdomen on the left. No free air is seen. Small bilateral pleural effusions are noted. IMPRESSION: Paucity of bowel gas consistent with fluid-filled loops of bowel. Electronically Signed   By: Inez Catalina M.D.   On: 03/12/2021 12:01    CCT 40 mins   Kennieth Rad, ACNP [facility] 03/13/2021, 10:27 AM

## 2021-03-13 NOTE — Progress Notes (Signed)
Repeat hgb after 2 Units PRBCs from this AM was 7.7. Dr Marcello Moores notified, as requested this AM, to see if additional units were necessary. Per verbal order from Dr. Marcello Moores, the additional 2 units of PRBCs were discontinued. This RN will continue to carefully monitor patient.

## 2021-03-13 NOTE — Progress Notes (Signed)
Referring Physician(s): Gross, Remo Lipps  Supervising Physician: Arne Cleveland  Patient Status:  [facility] - In-pt  Chief Complaint:  S/p LUQ fluid collection aspiration and drain placement 03/12/21 at bedside by Dr. Pascal Lux  Subjective:  Patient laying in bed with soft mittens on, not in acute distress.  Family members at the bedside.  Patient appears confused, oriented to self only but cooperative.  RN at the bedside, reports that the drain is working well, she emptied 75 cc of fluid from the gravity bag today.   Allergies: Nsaids  Medications: Prior to Admission medications   Medication Sig Start Date End Date Taking? Authorizing Provider  Diclofenac Sodium 2 % SOLN Place 2 g onto the skin 2 (two) times daily. Patient taking differently: Place 2 g onto the skin daily as needed (pain). 06/30/19  Yes Lyndal Pulley, DO  docusate sodium (COLACE) 100 MG capsule Take 100 mg by mouth daily as needed for mild constipation.    Yes [provider]  HYDROcodone-acetaminophen (NORCO/VICODIN) 5-325 MG tablet Take 1 tablet by mouth every 6 (six) hours as needed for moderate pain. 02/12/21  Yes Owens Shark, NP  hydrocortisone (ANUSOL-HC) 2.5 % rectal cream Place 1 application rectally 2 (two) times daily. Patient taking differently: Place 1 application rectally daily as needed for hemorrhoids or anal itching. 01/28/21  Yes Marrian Salvage, FNP  lidocaine-prilocaine (EMLA) cream Apply 1 application topically as needed. Patient taking differently: Apply 1 application topically as needed (prior to port use). 10/21/20  Yes Ladell Pier, MD  pravastatin (PRAVACHOL) 20 MG tablet TAKE 1 TABLET(20 MG) BY MOUTH DAILY Patient taking differently: Take 20 mg by mouth daily. 11/12/20  Yes Marrian Salvage, FNP  capecitabine (XELODA) 500 MG tablet TAKE 2000 MG (4 TABLETS) EVERY MORNING AND 1500 MG (3 TABLETS) EVERY EVENING FOR A TOTAL DAILY DOSE OF 3500 MG ON RADIATION DAYS ONLY. Patient  not taking: No sig reported 11/04/20 11/04/21  Ladell Pier, MD  diphenoxylate-atropine (LOMOTIL) 2.5-0.025 MG tablet Take 1-2 tablets by mouth 3 (three) times daily as needed for diarrhea or loose stools (Maxiumum of 6/day). Patient not taking: No sig reported 12/25/20   Ladell Pier, MD  ondansetron (ZOFRAN) 8 MG tablet Take 1 tablet (8 mg total) by mouth every 8 (eight) hours as needed for nausea or vomiting. Start 72 hours after each IV chemotherapy treatment 07/30/20   Ladell Pier, MD  prochlorperazine (COMPAZINE) 10 MG tablet Take 1 tablet (10 mg total) by mouth every 6 (six) hours as needed for nausea. 07/30/20   Ladell Pier, MD     Vital Signs: BP (!) 141/36   Pulse (!) 102   Temp 99.6 F (37.6 C) (Oral)   Resp (!) 37   Wt 187 lb 13.3 oz (85.2 kg)   SpO2 98%   BMI 26.95 kg/m   Physical Exam Vitals reviewed.  Constitutional:      General: He is not in acute distress.    Appearance: He is ill-appearing.  HENT:     Head: Normocephalic and atraumatic.  Cardiovascular:     Rate and Rhythm: Tachycardia present.  Pulmonary:     Effort: Pulmonary effort is normal.  Abdominal:     General: Abdomen is flat.     Palpations: Abdomen is soft.     Comments: Positive LUQ drain to a gravity bag. Site is unremarkable with no erythema, edema, tenderness, bleeding or drainage. Suture and stat lock in place. Dressing is  clean, dry, and intact. Trace of light brown colored fluid noted in the gravity bag. Drain aspirates and flushes well.   Skin:    General: Skin is warm and dry.  Neurological:     Mental Status: He is disoriented.     Imaging: DG Abd 1 View  Result Date: 03/09/2021 CLINICAL DATA:  NG tube placement EXAM: ABDOMEN - 1 VIEW COMPARISON:  None. FINDINGS: NG tube appears adequately positioned in the stomach. Paucity of bowel gas. IMPRESSION: NG tube adequately positioned in the stomach. Electronically Signed   By: Franki Cabot M.D.   On: 03/09/2021 18:33    CT ABDOMEN PELVIS W CONTRAST  Result Date: 03/11/2021 CLINICAL DATA:  70 year old history of rectal cancer and status post low anterior resection with end colostomy. Postoperative fevers. EXAM: CT ABDOMEN AND PELVIS WITH CONTRAST TECHNIQUE: Multidetector CT imaging of the abdomen and pelvis was performed using the standard protocol following bolus administration of intravenous contrast. CONTRAST:  166mL OMNIPAQUE IOHEXOL 300 MG/ML  SOLN COMPARISON:  CT abdomen and pelvis 10/31/2020 FINDINGS: Lower chest: Bilateral pleural effusions. Pleural effusions are small to moderate in size but incompletely evaluated. Compressive atelectasis in both lower lobes. The distal esophagus is distended. The nasogastric tube is coiled in the stomach and the tip actually extends back into the distal esophagus. Hepatobiliary: Normal appearance of the liver. There is low-density collection along the posterior aspect of the right hepatic lobe which could be in the subcapsular space. This collection measures 5.2 x 4.1 x 7.5 cm. No gross abnormality to the gallbladder. No significant biliary dilatation. Pancreas: Unremarkable. No pancreatic ductal dilatation or surrounding inflammatory changes. Spleen: Normal appearance of the spleen. Adrenals/Urinary Tract: Normal appearance of the adrenal glands. Evidence for cysts in the right kidney. Negative for hydronephrosis. Urinary bladder is decompressed with a Foley catheter. Stomach/Bowel: Patient has a left colostomy with a Hartmann's pouch. There is a small amount of fluid in the residual rectum. Distal esophagus appears to be markedly distended but difficult to exclude wall thickening in the esophagus. Nasogastric tube is coiled in the stomach but the tip actually extends cephalad into the distal esophagus. Dilated fluid-filled loops of small bowel throughout the abdomen. There appears to be diffuse wall thickening involving the transverse and left colon. Possible bowel wall thickening  at the cecum. In addition, there is probably wall thickening involving small bowel in the left upper abdomen. Vascular/Lymphatic: Atherosclerotic calcifications in the abdominal aorta without aneurysm. No significant lymph node enlargement in the abdomen or pelvis. Reproductive: Limited evaluation of the prostate. Large amount of fluid in the scrotum bilaterally. This finding was present on the previous examination and most compatible with bilateral hydroceles. There is a small amount of gas near the right inguinal canal and not clear if this is within the canal or the subcutaneous tissues. Other: Small to moderate amount of fluid in the left upper abdomen which may represent ascites. Small amount of fluid in the left lower abdomen. There is a small amount of fluid in the pelvis surrounding the surgical drain. There is a right-sided surgical drain that extends into the pelvis and the tip is on the left side of the pelvis. Large amount of subcutaneous edema. Musculoskeletal: No acute bone abnormality. Scattered subcutaneous gas in the abdomen and pelvis. IMPRESSION: 1. Diffuse dilatation of small bowel and colon. In addition, there is evidence for colonic wall thickening and suspect there is small bowel wall thickening in the left upper abdomen. Findings raise concern for colitis and  enteritis. There is likely an adynamic ileus. 2. Anasarca with bilateral pleural effusions, ascites and large amount of subcutaneous edema. 3. Scattered intra-abdominal free fluid/ascites. Largest component is in the left upper abdomen and small amount of fluid in the pelvis surround the surgical drain. 4. Focal low-density fluid collection posterior to the right hepatic lobe likely in the subcapsular space. This low-density collection measures up to 7.5 cm. This could represent loculated ascites but indeterminate. 5. Distended esophagus. Nasogastric tube is coiled in the stomach but the tip is actually in the distal esophagus. Consider  repositioning of the nasogastric tube. 6. Bilateral hydroceles. These results were called by telephone at the time of interpretation on 03/11/2021 at 4:11 pm to provider [facility] , who verbally acknowledged these results. Electronically Signed   By: Markus Daft M.D.   On: 03/11/2021 16:17   US RENAL  Result Date: 03/09/2021 CLINICAL DATA:  Acute kidney injury EXAM: RENAL / URINARY TRACT ULTRASOUND COMPLETE COMPARISON:  None. FINDINGS: Right Kidney: Renal measurements: 11.5 x 5.3 x 5.6 cm = volume: 176 mL. Echogenicity within normal limits. RIGHT renal cyst measures 2.4 cm. No suspicious mass or hydronephrosis. Left Kidney: Renal measurements: 11.5 x 6.5 x 5 cm = volume: 195 mL. Echogenicity within normal limits. No mass or hydronephrosis visualized. Bladder: Decompressed by Foley catheter. Other: None. IMPRESSION: No acute or significant findings.  No hydronephrosis. Electronically Signed   By: Franki Cabot M.D.   On: 03/09/2021 18:35   IR US Guide Bx Asp/Drain  Result Date: 03/12/2021 INDICATION: History of rectal cancer post LAR with end colostomy, now with postoperative abdominal distension and fevers with indeterminate fluid collection within the left upper abdominal quadrant. Please perform ultrasound-guided aspiration and/or drainage catheter placement for infection source control purposes. EXAM: ULTRASOUND-GUIDED PERCUTANEOUS DRAINAGE CATHETER PLACEMENT COMPARISON:  CT abdomen pelvis-03/11/2021 MEDICATIONS: The patient is currently admitted to the hospital and receiving intravenous antibiotics. The antibiotics were administered within an appropriate time frame prior to the initiation of the procedure. ANESTHESIA/SEDATION: None CONTRAST:  None COMPLICATIONS: None immediate. PROCEDURE: Informed written consent was obtained from the patient's wife after a discussion of the risks, benefits and alternatives to treatment. The patient was placed supine on his ICU hospital bed and preprocedural sonographic  evaluation was performed of the left upper abdominal quadrant demonstrating small to moderate-sized moderately complex fluid collection compatible with the findings seen on preceding abdominal CT image 26, series 2. the procedure was planned. A timeout was performed prior to the initiation of the procedure. The skin overlying the left upper abdominal quadrant was prepped and draped in the usual sterile fashion. The overlying soft tissues were anesthetized with 1% lidocaine with epinephrine an 18 gauge trocar needle was utilized to access the fluid collection. A small amount of purulent fluid was aspirated the decision was made to proceed with definitive percutaneous drainage catheter placement As such, a short Amplatz wire was coiled within the collection. Appropriate position was confirmed with sonographic evaluation. Multiple ultrasound images were saved procedural documentation purposes. Next, the track was dilated ultimately allowing placement of a 12 [place] all-purpose drainage catheter. Appropriate positioning was again confirmed with sonographic evaluation and image documentation Next, approximately 160 cc of purulent fluid was aspirated. The tube was connected to a drainage bag and sutured in place. A dressing was placed. The patient tolerated the procedure well without immediate post procedural complication. IMPRESSION: Successful CT guided placement of a 52 French all purpose drain catheter into the complex fluid collection with the left  upper abdominal quadrant with aspiration of 160 mL of purulent fluid. Samples were sent to the laboratory as requested by the ordering clinical team. Electronically Signed   By: Sandi Mariscal M.D.   On: 03/12/2021 07:52   DG CHEST PORT 1 VIEW  Result Date: 03/12/2021 CLINICAL DATA:  Aspiration EXAM: PORTABLE CHEST 1 VIEW COMPARISON:  03/12/2021 FINDINGS: Single frontal view of the chest demonstrates a stable right chest wall port and right-sided PICC. Cardiac silhouette  is stable. Persistent bibasilar veiling opacities unchanged since prior exam. No pneumothorax. Pigtail drain partially visualized within the left upper quadrant abdomen. IMPRESSION: 1. Stable bibasilar veiling opacities consistent with consolidation and effusion. Superimposed aspiration would be difficult to exclude. Continued radiographic follow-up is recommended. Electronically Signed   By: Randa Ngo M.D.   On: 03/12/2021 20:20   DG CHEST PORT 1 VIEW  Result Date: 03/12/2021 CLINICAL DATA:  Respiratory distress EXAM: PORTABLE CHEST 1 VIEW COMPARISON:  03/09/2021 FINDINGS: Cardiac shadow is stable. Chest wall port is again noted on the right. Right-sided PICC line is noted in the mid right atrium and could be withdrawn as clinically necessary. Bilateral pleural effusions are again seen and stable. No new focal infiltrate is noted. IMPRESSION: Stable bilateral effusions with underlying atelectatic changes. New right-sided PICC line is in the midportion of the right atrium and could be withdrawn as clinically necessary. Electronically Signed   By: Inez Catalina M.D.   On: 03/12/2021 11:59   DG CHEST PORT 1 VIEW  Result Date: 03/09/2021 CLINICAL DATA:  Increasing shortness of breath, wheezing, known rectal cancer EXAM: PORTABLE CHEST 1 VIEW COMPARISON:  03/07/2021 FINDINGS: Single frontal view of the chest demonstrates stable right chest wall port. Cardiac silhouette is stable. No change in bilateral veiling opacities consistent with consolidation and effusions. No pneumothorax. IMPRESSION: 1. Stable bibasilar consolidation and effusions. Electronically Signed   By: Randa Ngo M.D.   On: 03/09/2021 17:36   DG Abd Portable 2V  Result Date: 03/12/2021 CLINICAL DATA:  Follow-up ileus EXAM: PORTABLE ABDOMEN - 2 VIEW COMPARISON:  03/11/2021 FINDINGS: Paucity of bowel gas is noted. Left-sided end colostomy is seen. Surgical drain is noted deep in the pelvis as well as a percutaneous drain in the upper  abdomen on the left. No free air is seen. Small bilateral pleural effusions are noted. IMPRESSION: Paucity of bowel gas consistent with fluid-filled loops of bowel. Electronically Signed   By: Inez Catalina M.D.   On: 03/12/2021 12:01   [place] EKG SITE RITE  Result Date: 03/10/2021 If Site Rite image not attached, placement could not be confirmed due to current cardiac rhythm.   Labs:  CBC: Recent Labs    03/11/21 0231 03/11/21 1510 03/12/21 0539 03/12/21 0942 03/13/21 0057 03/13/21 1114  WBC 11.4*  --  20.6* 21.1* 20.1*  --   HGB 5.4*   < > 7.5*  7.5* 7.3* 5.6* 7.7*  HCT 15.1*   < > 21.3*  21.8* 20.9* 16.3* 22.6*  PLT 136*  --  170 156 155  --    < > = values in this interval not displayed.    COAGS: No results for input(s): INR, APTT in the last 8760 hours.  BMP: Recent Labs    07/04/20 0845 07/15/20 0048 08/13/20 0906 03/12/21 0539 03/12/21 1840 03/13/21 0517 03/13/21 1005  NA 138 136   < > 148* 149* 152* 151*  K 3.8 3.7   < > 3.3* 3.5 3.8 3.7  CL 108 106   < >  114* 118* 122* 121*  CO2 25 21*   < > 26 24 24 24   GLUCOSE 105* 100*   < > 128* 150* 151* 147*  BUN 12 21   < > 39* 40* 52* 54*  CALCIUM 9.9 9.2   < > 8.1* 7.9* 7.9* 7.8*  CREATININE 0.96 0.94   < > 1.40* 1.21 1.29* 1.28*  GFRNONAA >60 >60   < > 54* >60 59* 60*  GFRAA >60 >60  --   --   --   --   --    < > = values in this interval not displayed.    LIVER FUNCTION TESTS: Recent Labs    03/08/21 0520 03/09/21 0211 03/11/21 0231 03/13/21 0517  BILITOT 1.1 0.9 1.2 0.7  AST 41 35 30 27  ALT 30 30 22 19   ALKPHOS 62 41 46 71  PROT 5.4* 5.8* 5.1* 5.4*  ALBUMIN 2.5* 2.7* 2.2* 2.1*    Assessment and Plan:  70 y/o male admitted for lower anterior resection for rectal cancer who subsequently developed shock and intra-abdominal fluid collection.  S/p LUQ fluid collection aspiration and drain placement at the bedside with Dr. Pascal Lux on 5/3   Patient appears lethargic and confused, oriented to self only  but cooperative.  LUQ drain intact, trace of light brown fluid in the gravity bag. Overnight output 155 cc. Culture pending Vitals hypertensive, tachycardic, tachypneic HGB dropped from 7.3 to 5.6 today, received 2 units of PRBCs WBC still elevated 20.1   Continue with flushing TID, output recording q shift and dressing changes as needed. Would consider additional imaging when output is less than 10 ml for 24 hours not including flush material.    Further treatment plan per CCS/PCCM/GI/TRH Appreciate and agree with the plan.  IR to follow.    Electronically Signed: Tera Mater, PA-C 03/13/2021, 3:38 PM   I spent a total of 15 Minutes at the the patient's bedside AND on the patient's hospital floor or unit, greater than 50% of which was counseling/coordinating care for LUQ drain

## 2021-03-13 NOTE — Progress Notes (Addendum)
Rectal carcinoma (HCC)  Subjective: Unable to replace NG yesterday.  Alert and answering questions, somewhat confused.  Feels pain all over.  Had a fever last night.  Had some bloody emesis as well.  Hgb down again today  Objective: Vital signs in last 24 hours: Temp:  [98 F (36.7 C)-101.9 F (38.8 C)] 98.4 F (36.9 C) (05/05 0800) Pulse Rate:  [95-113] 113 (05/04 2300) Resp:  [23-43] 34 (05/05 0700) BP: (80-146)/(16-84) 93/34 (05/05 0700) SpO2:  [93 %-100 %] 99 % (05/05 0745) Weight:  [85.2 kg] 85.2 kg (05/05 0500) Last BM Date: 03/13/21  Intake/Output from previous day: 05/04 0701 - 05/05 0700 In: 3870.3 [I.V.:2666.1; Blood:360.4; IV Piggyback:828.8] Out: 4222 [Urine:1750; Emesis/NG output:550; Drains:162; Stool:1760] Intake/Output this shift: No intake/output data recorded.  General appearance: alert and cooperative GI: normal findings: soft, TTP diffusely, no peritonitis Incision/Wound: clean, dry, intact  Lab Results:  Results for orders placed or performed during the hospital encounter of 03/05/21 (from the past 24 hour(s))  Lactic acid, plasma     Status: None   Collection Time: 03/12/21  9:42 AM  Result Value Ref Range   Lactic Acid, Venous 1.7 0.5 - 1.9 mmol/L  Procalcitonin - Baseline     Status: None   Collection Time: 03/12/21  9:42 AM  Result Value Ref Range   Procalcitonin 13.73 ng/mL  CBC     Status: Abnormal   Collection Time: 03/12/21  9:42 AM  Result Value Ref Range   WBC 21.1 (H) 4.0 - 10.5 K/uL   RBC 2.44 (L) 4.22 - 5.81 MIL/uL   Hemoglobin 7.3 (L) 13.0 - 17.0 g/dL   HCT 20.9 (L) 39.0 - 52.0 %   MCV 85.7 80.0 - 100.0 fL   MCH 29.9 26.0 - 34.0 pg   MCHC 34.9 30.0 - 36.0 g/dL   RDW 15.6 (H) 11.5 - 15.5 %   Platelets 156 150 - 400 K/uL   nRBC 7.5 (H) 0.0 - 0.2 %  Glucose, capillary     Status: Abnormal   Collection Time: 03/12/21 11:35 AM  Result Value Ref Range   Glucose-Capillary 128 (H) 70 - 99 mg/dL   Comment 1 Notify RN    Comment 2  Document in Chart   Glucose, capillary     Status: Abnormal   Collection Time: 03/12/21  5:13 PM  Result Value Ref Range   Glucose-Capillary 128 (H) 70 - 99 mg/dL   Comment 1 Notify RN    Comment 2 Document in Chart   Basic metabolic panel     Status: Abnormal   Collection Time: 03/12/21  6:40 PM  Result Value Ref Range   Sodium 149 (H) 135 - 145 mmol/L   Potassium 3.5 3.5 - 5.1 mmol/L   Chloride 118 (H) 98 - 111 mmol/L   CO2 24 22 - 32 mmol/L   Glucose, Bld 150 (H) 70 - 99 mg/dL   BUN 40 (H) 8 - 23 mg/dL   Creatinine, Ser 1.21 0.61 - 1.24 mg/dL   Calcium 7.9 (L) 8.9 - 10.3 mg/dL   GFR, Estimated >60 >60 mL/min   Anion gap 7 5 - 15  Glucose, capillary     Status: Abnormal   Collection Time: 03/12/21  8:06 PM  Result Value Ref Range   Glucose-Capillary 147 (H) 70 - 99 mg/dL  Glucose, capillary     Status: Abnormal   Collection Time: 03/13/21 12:26 AM  Result Value Ref Range   Glucose-Capillary 128 (H) 70 - 99  mg/dL  CBC     Status: Abnormal   Collection Time: 03/13/21 12:57 AM  Result Value Ref Range   WBC 20.1 (H) 4.0 - 10.5 K/uL   RBC 1.75 (L) 4.22 - 5.81 MIL/uL   Hemoglobin 5.6 (LL) 13.0 - 17.0 g/dL   HCT 16.3 (L) 39.0 - 52.0 %   MCV 93.1 80.0 - 100.0 fL   MCH 32.0 26.0 - 34.0 pg   MCHC 34.4 30.0 - 36.0 g/dL   RDW 16.6 (H) 11.5 - 15.5 %   Platelets 155 150 - 400 K/uL   nRBC 19.0 (H) 0.0 - 0.2 %  Prepare RBC (crossmatch)     Status: None   Collection Time: 03/13/21  1:47 AM  Result Value Ref Range   Order Confirmation      ORDER PROCESSED BY BLOOD BANK Performed at [facility], [place] [address], [place], [place] [address]   Glucose, capillary     Status: Abnormal   Collection Time: 03/13/21  4:17 AM  Result Value Ref Range   Glucose-Capillary 137 (H) 70 - 99 mg/dL  Glucose, capillary     Status: Abnormal   Collection Time: 03/13/21  7:37 AM  Result Value Ref Range   Glucose-Capillary 137 (H) 70 - 99 mg/dL      Studies/Results Radiology     MEDS, Scheduled . sodium chloride   Intravenous Once  . bacitracin   Topical BID  . chlorhexidine  15 mL Mouth Rinse BID  . Chlorhexidine Gluconate Cloth  6 each Topical Daily  . enoxaparin (LOVENOX) injection  40 mg Subcutaneous Q24H  . insulin aspart  0-9 Units Subcutaneous Q4H  . lip balm  1 application Topical BID  . mouth rinse  15 mL Mouth Rinse q12n4p  . melatonin  3 mg Oral QHS  . pantoprazole (PROTONIX) IV  40 mg Intravenous Q12H  . sodium chloride flush  10-40 mL Intracatheter Q12H  . sodium chloride flush  5 mL Intracatheter Q8H     Assessment: Rectal carcinoma (HCC) Post op abscess  Plan: Cont IVF's, TPN Acute blood loss anemia: most likely due to bloody emesis, transfused 2 units.  Will get repeat H/H to assess, on IV PPI Cont IV abx, IV tylenol Try to wean off pressors if possible RT for pulm toilet Daughter and wife updated today CXR and AXR look ok.  Will hold of on NG for now as it seems to be causing trauma   LOS: 8 days    Rosario Adie, MD [facility], [place]    03/13/2021 8:12 AM

## 2021-03-13 NOTE — Progress Notes (Signed)
Informed by patient's nurse that verbal order given by surgeon to transfuse blood but he instructed RN to enter orders. Paged surgeon, Dr. Nadeen Landau, and he called back stating he does not have access to Epic on home computer. He stated cone RN do it all the time. Advised our staff does not, but would do with clarification. He stated he wanted 2 units PRBCs for hemoglobin of 5.6. Confirmed that type and screen have already been done and consent form filed in chart. Orders entered and checked with blood bank to ensure they were entered correctly.

## 2021-03-13 NOTE — Progress Notes (Signed)
At AM rounding, this RN showed Dr. Marcello Moores patient's colostomy output which appeared to be black/brown and red-tinged. RN also mentioned patient's large bloody emesis occurrence from overnight. At this time, Dr. Marcello Moores would like to hold off on NG tube reinsertion d/t her concern that patient's symptoms are being exacerbated by trauma from NG tube. GI was also consulted by CCM.   2 units of PRBCs were given as ordered this AM; awaiting results of follow-up hgb. This RN will continue to carefully monitor patient for signs of bleeding.

## 2021-03-14 ENCOUNTER — Inpatient Hospital Stay (HOSPITAL_COMMUNITY): Payer: Medicare Other | Admitting: Certified Registered Nurse Anesthetist

## 2021-03-14 ENCOUNTER — Encounter (HOSPITAL_COMMUNITY): Payer: Self-pay | Admitting: General Surgery

## 2021-03-14 ENCOUNTER — Encounter (HOSPITAL_COMMUNITY)
Admission: RE | Disposition: A | Discharge status: Home / Self Care | Payer: Self-pay | Source: Home / Self Care | Attending: General Surgery

## 2021-03-14 DIAGNOSIS — R579 Shock, unspecified: Secondary | ICD-10-CM

## 2021-03-14 DIAGNOSIS — N179 Acute kidney failure, unspecified: Secondary | ICD-10-CM | POA: Diagnosis not present

## 2021-03-14 DIAGNOSIS — K2091 Esophagitis, unspecified with bleeding: Secondary | ICD-10-CM

## 2021-03-14 DIAGNOSIS — R739 Hyperglycemia, unspecified: Secondary | ICD-10-CM

## 2021-03-14 DIAGNOSIS — G9340 Encephalopathy, unspecified: Secondary | ICD-10-CM | POA: Diagnosis not present

## 2021-03-14 DIAGNOSIS — D500 Iron deficiency anemia secondary to blood loss (chronic): Secondary | ICD-10-CM

## 2021-03-14 DIAGNOSIS — C20 Malignant neoplasm of rectum: Secondary | ICD-10-CM | POA: Diagnosis not present

## 2021-03-14 HISTORY — PX: ESOPHAGOGASTRODUODENOSCOPY (EGD) WITH PROPOFOL: SHX5813

## 2021-03-14 LAB — CBC
HCT: 22.8 % — ABNORMAL LOW (ref 39.0–52.0)
HCT: 21 % — ABNORMAL LOW (ref 39.0–52.0)
Hemoglobin: 7.2 g/dL — ABNORMAL LOW (ref 13.0–17.0)
Hemoglobin: 6.7 g/dL — CL (ref 13.0–17.0)
MCH: 29.3 pg (ref 26.0–34.0)
MCH: 29.5 pg (ref 26.0–34.0)
MCHC: 31.6 g/dL (ref 30.0–36.0)
MCHC: 31.9 g/dL (ref 30.0–36.0)
MCV: 92.7 fL (ref 80.0–100.0)
MCV: 92.5 fL (ref 80.0–100.0)
Platelets: 188 10*3/uL (ref 150–400)
Platelets: 178 10*3/uL (ref 150–400)
RBC: 2.46 MIL/uL — ABNORMAL LOW (ref 4.22–5.81)
RBC: 2.27 MIL/uL — ABNORMAL LOW (ref 4.22–5.81)
RDW: 17.4 % — ABNORMAL HIGH (ref 11.5–15.5)
RDW: 17.3 % — ABNORMAL HIGH (ref 11.5–15.5)
WBC: 20.1 10*3/uL — ABNORMAL HIGH (ref 4.0–10.5)
WBC: 18.2 10*3/uL — ABNORMAL HIGH (ref 4.0–10.5)
nRBC: 8.5 % — ABNORMAL HIGH (ref 0.0–0.2)
nRBC: 6.6 % — ABNORMAL HIGH (ref 0.0–0.2)

## 2021-03-14 LAB — BASIC METABOLIC PANEL
Anion gap: 8 (ref 5–15)
BUN: 46 mg/dL — ABNORMAL HIGH (ref 8–23)
CO2: 21 mmol/L — ABNORMAL LOW (ref 22–32)
Calcium: 8 mg/dL — ABNORMAL LOW (ref 8.9–10.3)
Chloride: 125 mmol/L — ABNORMAL HIGH (ref 98–111)
Creatinine, Ser: 1.45 mg/dL — ABNORMAL HIGH (ref 0.61–1.24)
GFR, Estimated: 52 mL/min — ABNORMAL LOW (ref >60)
Glucose, Bld: 168 mg/dL — ABNORMAL HIGH (ref 70–99)
Potassium: 3.8 mmol/L (ref 3.5–5.1)
Sodium: 154 mmol/L — ABNORMAL HIGH (ref 135–145)

## 2021-03-14 LAB — CULTURE, BLOOD (ROUTINE X 2)
Culture: NO GROWTH
Culture: NO GROWTH
Special Requests: ADEQUATE
Special Requests: ADEQUATE

## 2021-03-14 LAB — GLUCOSE, CAPILLARY
Glucose-Capillary: 157 mg/dL — ABNORMAL HIGH (ref 70–99)
Glucose-Capillary: 136 mg/dL — ABNORMAL HIGH (ref 70–99)
Glucose-Capillary: 146 mg/dL — ABNORMAL HIGH (ref 70–99)
Glucose-Capillary: 140 mg/dL — ABNORMAL HIGH (ref 70–99)
Glucose-Capillary: 138 mg/dL — ABNORMAL HIGH (ref 70–99)

## 2021-03-14 LAB — PROCALCITONIN: Procalcitonin: 4.77 ng/mL

## 2021-03-14 LAB — MAGNESIUM: Magnesium: 2.2 mg/dL (ref 1.7–2.4)

## 2021-03-14 LAB — HEMOGLOBIN AND HEMATOCRIT, BLOOD
HCT: 24.8 % — ABNORMAL LOW (ref 39.0–52.0)
Hemoglobin: 8 g/dL — ABNORMAL LOW (ref 13.0–17.0)

## 2021-03-14 LAB — H. PYLORI ANTIBODY, IGG: H Pylori IgG: 0.67 Index Value (ref 0.00–0.79)

## 2021-03-14 LAB — PREPARE RBC (CROSSMATCH)

## 2021-03-14 SURGERY — ESOPHAGOGASTRODUODENOSCOPY (EGD) WITH PROPOFOL
Anesthesia: Monitor Anesthesia Care

## 2021-03-14 MED ORDER — SODIUM CHLORIDE 0.9 % IV SOLN
25.0000 mg | Freq: Once | INTRAVENOUS | Status: AC
  Administered 2021-03-14: 25 mg via INTRAVENOUS
  Filled 2021-03-14: qty 0.5

## 2021-03-14 MED ORDER — DEXTROSE 5 % IV SOLN: INTRAVENOUS | Status: DC

## 2021-03-14 MED ORDER — LIDOCAINE 2% (20 MG/ML) 5 ML SYRINGE
INTRAMUSCULAR | Status: DC | PRN
  Administered 2021-03-14: 80 mg via INTRAVENOUS

## 2021-03-14 MED ORDER — SODIUM CHLORIDE 0.9 % IV SOLN
INTRAVENOUS | Status: DC
Start: 2021-03-14 — End: 2021-03-14

## 2021-03-14 MED ORDER — SODIUM CHLORIDE 0.9 % IV SOLN
500.0000 mg | Freq: Once | INTRAVENOUS | Status: AC
  Administered 2021-03-14: 500 mg via INTRAVENOUS
  Filled 2021-03-14: qty 10

## 2021-03-14 MED ORDER — TRAVASOL 10 % IV SOLN
INTRAVENOUS | Status: AC
  Filled 2021-03-14: qty 1123.2

## 2021-03-14 MED ORDER — PROPOFOL 10 MG/ML IV BOLUS
INTRAVENOUS | Status: DC | PRN
  Administered 2021-03-14: 10 mg via INTRAVENOUS
  Administered 2021-03-14 (×2): 20 mg via INTRAVENOUS
  Administered 2021-03-14 (×2): 10 mg via INTRAVENOUS

## 2021-03-14 MED ORDER — SODIUM CHLORIDE 0.9% IV SOLUTION: Freq: Once | INTRAVENOUS | Status: AC

## 2021-03-14 MED ORDER — ACETAMINOPHEN 10 MG/ML IV SOLN
1000.0000 mg | Freq: Four times a day (QID) | INTRAVENOUS | Status: AC
  Administered 2021-03-14 – 2021-03-15 (×4): 1000 mg via INTRAVENOUS
  Filled 2021-03-14 (×4): qty 100

## 2021-03-14 MED ORDER — PROPOFOL 500 MG/50ML IV EMUL
INTRAVENOUS | Status: DC | PRN
  Administered 2021-03-14: 120 ug/kg/min via INTRAVENOUS

## 2021-03-14 SURGICAL SUPPLY — 15 items

## 2021-03-14 NOTE — Transfer of Care (Signed)
Immediate Anesthesia Transfer of Care Note  Patient: Shane [name]  Procedure(s) Performed: ESOPHAGOGASTRODUODENOSCOPY (EGD) WITH PROPOFOL (N/A )  Patient Location: Endoscopy Unit  Anesthesia Type:MAC  Level of Consciousness: drowsy and patient cooperative  Airway & Oxygen Therapy: Patient Spontanous Breathing and Patient connected to face mask oxygen  Post-op Assessment: Report given to RN and Post -op Vital signs reviewed and stable  Post vital signs: Reviewed and stable  Last Vitals:  Vitals Value Taken Time  BP 122/38 03/14/21 1500  Temp    Pulse 92 03/14/21 1502  Resp 37 03/14/21 1502  SpO2 100 % 03/14/21 1502  Vitals shown include unvalidated device data.  Last Pain:  Vitals:   03/14/21 1459  TempSrc:   PainSc: 0-No pain      Patients Stated Pain Goal: 0 (09/81/19 1478)  Complications: No complications documented.

## 2021-03-14 NOTE — Op Note (Signed)
[facility] Patient Name: Shane [name] Procedure Date: 03/14/2021 MRN: [id number] Attending MD: Gerrit Heck , MD Date of Birth: 07-Feb-1951 CSN: [id number] Age: 70 Admit Type: Outpatient Procedure:                Upper GI endoscopy Indications:              Acute post hemorrhagic anemia, Hematemesis Providers:                Gerrit Heck, MD, Mariana Arn, Theodora Blow,                            Technician Referring MD:              Medicines:                Monitored Anesthesia Care Complications:            No immediate complications. Estimated Blood Loss:     Estimated blood loss: none. Procedure:                Pre-Anesthesia Assessment:                           - Prior to the procedure, a History and Physical                            was performed, and patient medications and                            allergies were reviewed. The patient's tolerance of                            previous anesthesia was also reviewed. The risks                            and benefits of the procedure and the sedation                            options and risks were discussed with the patient.                            All questions were answered, and informed consent                            was obtained. Prior Anticoagulants: The patient has                            taken no previous anticoagulant or antiplatelet                            agents. ASA Grade Assessment: III - A patient with                            severe systemic disease. After reviewing the risks  and benefits, the patient was deemed in                            satisfactory condition to undergo the procedure.                           After obtaining informed consent, the endoscope was                            passed under direct vision. Throughout the                            procedure, the patient's blood pressure, pulse, and                            oxygen  saturations were monitored continuously. The                            GIF-H190 ([id number]) Olympus gastroscope was                            introduced through the mouth, and advanced to the                            body of the stomach. The upper GI endoscopy was                            technically difficult and complex due to presence                            of food. The patient tolerated the procedure well. Scope In: Scope Out: Findings:      LA Grade D (one or more mucosal breaks involving at least 75% of       esophageal circumference) esophagitis with bleeding was found in the       entire esophagus. There was mucosal friability with contact oozing.      A large amount of food (residue) was found in the gastric fundus, in the       gastric body and in the gastric antrum. Attempted fluid aspiration, but       this was precluded due to solid food residue that kept clogging the       endoscope.      The limited mucosa that was visible in the gastric fundus and gastric       body was otherwise normal. No blood noted in the gastric lumen, albeit       on very limited views. Impression:               - Severe erosive esophagitis with contact oozing                            and friability.                           - A large amount of food (residue) in the stomach.                           -  No specimens collected. Moderate Sedation:      Not Applicable - Patient had care per Anesthesia. Recommendation:           - Return patient to hospital ward for ongoing care.                           - NPO due to ileus.                           - Use sucralfate suspension 1 gram PO QID.                           - Use Protonix (pantoprazole) 40 mg IV BID.                           - Continue serial Hgb/Hct checks with blood                            products as needed per protocol. Procedure Code(s):        --- Professional ---                           [phone], 52,  Esophagogastroduodenoscopy, flexible,                            transoral; diagnostic, including collection of                            specimen(s) by brushing or washing, when performed                            (separate procedure) Diagnosis Code(s):        --- Professional ---                           K20.91, Esophagitis, unspecified with bleeding                           D62, Acute posthemorrhagic anemia                           K92.0, Hematemesis CPT copyright 2019 American Medical Association. All rights reserved. The codes documented in this report are preliminary and upon coder review may  be revised to meet current compliance requirements. Gerrit Heck, MD 03/14/2021 3:01:17 PM Number of Addenda: 0

## 2021-03-14 NOTE — Progress Notes (Signed)
Rectal carcinoma (HCC)  Subjective: Received 2 u pRBC's, hgb down a bit this am.  GI to perform EGD later today.  Continues to have occasional fevers  Objective: Vital signs in last 24 hours: Temp:  [98.1 F (36.7 C)-102.6 F (39.2 C)] 98.9 F (37.2 C) (05/06 0700) Pulse Rate:  [102-116] 116 (05/05 1600) Resp:  [23-43] 29 (05/06 0800) BP: (95-154)/(21-70) 120/52 (05/06 0800) SpO2:  [94 %-100 %] 97 % (05/06 0400) Last BM Date: 03/14/21 (from ostomy)  Intake/Output from previous day: 05/05 0701 - 05/06 0700 In: 2676.7 [I.V.:2066.2; Blood:371; IV Piggyback:229.5] Out: 2446 [Urine:1900; Drains:96; Stool:450] Intake/Output this shift: Total I/O In: 1050.8 [I.V.:1006.9; IV Piggyback:43.9] Out: 650 [Urine:300; Stool:350]  General appearance: alert and cooperative GI: normal findings: soft, TTP diffusely, no peritonitis Incision/Wound: clean, dry, intact JP: clear Ostomy: stool in bag, beefy red LLQ drain: purulent output Lab Results:  Results for orders placed or performed during the hospital encounter of 03/05/21 (from the past 24 hour(s))  Hemoglobin and hematocrit, blood     Status: Abnormal   Collection Time: 03/13/21 11:14 AM  Result Value Ref Range   Hemoglobin 7.7 (L) 13.0 - 17.0 g/dL   HCT 22.6 (L) 39.0 - 52.0 %  Glucose, capillary     Status: Abnormal   Collection Time: 03/13/21 11:48 AM  Result Value Ref Range   Glucose-Capillary 120 (H) 70 - 99 mg/dL  Glucose, capillary     Status: Abnormal   Collection Time: 03/13/21  4:14 PM  Result Value Ref Range   Glucose-Capillary 124 (H) 70 - 99 mg/dL   Comment 1 Notify RN    Comment 2 Document in Chart   Glucose, capillary     Status: Abnormal   Collection Time: 03/13/21  8:11 PM  Result Value Ref Range   Glucose-Capillary 144 (H) 70 - 99 mg/dL  Glucose, capillary     Status: Abnormal   Collection Time: 03/13/21 11:53 PM  Result Value Ref Range   Glucose-Capillary 144 (H) 70 - 99 mg/dL  Procalcitonin     Status:  None   Collection Time: 03/14/21  2:37 AM  Result Value Ref Range   Procalcitonin 4.77 ng/mL  Basic metabolic panel     Status: Abnormal   Collection Time: 03/14/21  2:37 AM  Result Value Ref Range   Sodium 154 (H) 135 - 145 mmol/L   Potassium 3.8 3.5 - 5.1 mmol/L   Chloride 125 (H) 98 - 111 mmol/L   CO2 21 (L) 22 - 32 mmol/L   Glucose, Bld 168 (H) 70 - 99 mg/dL   BUN 46 (H) 8 - 23 mg/dL   Creatinine, Ser 1.45 (H) 0.61 - 1.24 mg/dL   Calcium 8.0 (L) 8.9 - 10.3 mg/dL   GFR, Estimated 52 (L) >60 mL/min   Anion gap 8 5 - 15  Magnesium     Status: None   Collection Time: 03/14/21  2:37 AM  Result Value Ref Range   Magnesium 2.2 1.7 - 2.4 mg/dL  CBC     Status: Abnormal   Collection Time: 03/14/21  2:37 AM  Result Value Ref Range   WBC 20.1 (H) 4.0 - 10.5 K/uL   RBC 2.46 (L) 4.22 - 5.81 MIL/uL   Hemoglobin 7.2 (L) 13.0 - 17.0 g/dL   HCT 22.8 (L) 39.0 - 52.0 %   MCV 92.7 80.0 - 100.0 fL   MCH 29.3 26.0 - 34.0 pg   MCHC 31.6 30.0 - 36.0 g/dL   RDW 17.4 (  H) 11.5 - 15.5 %   Platelets 188 150 - 400 K/uL   nRBC 8.5 (H) 0.0 - 0.2 %  Glucose, capillary     Status: Abnormal   Collection Time: 03/14/21  4:58 AM  Result Value Ref Range   Glucose-Capillary 157 (H) 70 - 99 mg/dL  Glucose, capillary     Status: Abnormal   Collection Time: 03/14/21  7:35 AM  Result Value Ref Range   Glucose-Capillary 136 (H) 70 - 99 mg/dL   Comment 1 Notify RN    Comment 2 Document in Chart   CBC Once     Status: Abnormal   Collection Time: 03/14/21  9:19 AM  Result Value Ref Range   WBC 18.2 (H) 4.0 - 10.5 K/uL   RBC 2.27 (L) 4.22 - 5.81 MIL/uL   Hemoglobin 6.7 (LL) 13.0 - 17.0 g/dL   HCT 21.0 (L) 39.0 - 52.0 %   MCV 92.5 80.0 - 100.0 fL   MCH 29.5 26.0 - 34.0 pg   MCHC 31.9 30.0 - 36.0 g/dL   RDW 17.3 (H) 11.5 - 15.5 %   Platelets 178 150 - 400 K/uL   nRBC 6.6 (H) 0.0 - 0.2 %  Prepare RBC (crossmatch)     Status: None   Collection Time: 03/14/21 10:12 AM  Result Value Ref Range   Order  Confirmation      BB SAMPLE OR UNITS ALREADY AVAILABLE Performed at [facility], [place] [address], [place], [place] [address]      Studies/Results Radiology     MEDS, Scheduled . sodium chloride   Intravenous Once  . bacitracin   Topical BID  . chlorhexidine  15 mL Mouth Rinse BID  . Chlorhexidine Gluconate Cloth  6 each Topical Daily  . insulin aspart  0-9 Units Subcutaneous Q4H  . lip balm  1 application Topical BID  . mouth rinse  15 mL Mouth Rinse q12n4p  . melatonin  3 mg Oral QHS  . [START ON 03/17/2021] pantoprazole  40 mg Intravenous Q12H  . sodium chloride flush  10-40 mL Intracatheter Q12H  . sodium chloride flush  5 mL Intracatheter Q8H     Assessment: Rectal carcinoma (HCC) Post op abscess  Plan: Cont IVF's, TPN Acute blood loss anemia: Continues to drop.  1 unit pRBCs ordered.  EGD today to eval for source.  Will get repeat H/H to assess after transfuison, on IV PPI, GI involved ID: Cont IV abx, Eraxis, IV tylenol for fevers, wbc coming down  RT for pulm toilet Daughter updated today via phone    LOS: 9 days    Rosario Adie, MD [facility], [place]    03/14/2021 10:41 AM

## 2021-03-14 NOTE — Anesthesia Postprocedure Evaluation (Signed)
Anesthesia Post Note  Patient: Doye Montilla  Procedure(s) Performed: ESOPHAGOGASTRODUODENOSCOPY (EGD) WITH PROPOFOL (N/A )     Patient location during evaluation: Endoscopy Anesthesia Type: MAC Level of consciousness: sedated and patient cooperative Pain management: pain level controlled Vital Signs Assessment: post-procedure vital signs reviewed and stable Respiratory status: spontaneous breathing, nonlabored ventilation, respiratory function stable and patient connected to nasal cannula oxygen Cardiovascular status: blood pressure returned to baseline and stable Postop Assessment: no apparent nausea or vomiting Anesthetic complications: no   No complications documented.  Last Vitals:  Vitals:   03/14/21 1510 03/14/21 1600  BP: (!) 127/40   Pulse: 97   Resp: (!) 31   Temp:  37.1 C  SpO2: 97%     Last Pain:  Vitals:   03/14/21 1600  TempSrc: Oral  PainSc:                  Cissy Galbreath,E. Patterson Hollenbaugh

## 2021-03-14 NOTE — Progress Notes (Signed)
NAME:  Shane [name], MRN:  [id number], DOB:  01/30/1951, LOS: 9 ADMISSION DATE:  03/05/2021, CONSULTATION DATE:  03/14/2021  REFERRING MD:  Milana Kidney, CHIEF COMPLAINT:  resp distress   History of Present Illness:  70 year old man presented with rectal bleeding, found to have rectal cancer, underwent anterior resection with end colostomy on 4/27.  Postop course complicated by ileus and AKI. NGT was attempted today but could not be placed. Creatinine has plateaued, given Lasix 5/1 Developed confusion overnight, pain medications were switched from Dilaudid to fentanyl with some improvement .  Venous Dopplers were negative for DVT 5/1 He developed increasing respiratory distress and tachypnea, hence PCCM consulted although oxygen requirements have not increased Febrile 100.7  He is status post radiation and Xeloda 01/03/2021  Pertinent  Medical History    Significant Hospital Events: Including procedures, antibiotic start and stop dates in addition to other pertinent events   . QTC 411 on 03/06/21 . Overnight 5/2, Hgb drop to 5.2.  2u PRBC ordered and started on Levophed for hypotension. START TPN, RUE PICC Placed  . 03/11/21 : Hgb drop to 5.2 .  Hypotensive overnight, now on low dose levo, BP adequate. Awaiting 2u PRBC transfusion this AM. Surgery planning for CT scan abd / pelv. Continue zosyn. Start Eraxis.  . 5/4 pathology c/w rectal ca, tarted on blood and vasopressors yesterday.  CT scan yesterday showed left upper quadrant intrasplenic and left flank fluid.  Yesterday status post CT-guided placement of 10 French catheter drainage of the left upper abdominal quadrant yielding 160 cc of purulent appearing fluid culture sent -> later in the evening shock was improving and AKI improving.  Currently on RA 97%.  On low-dose Levophed.  On antibiotics.  Still febrile.  Has foley since admit.  According to bedside nurse mostly oriented but intermittently delirious but also complains of nonspecific  pain diffusely.  Also sensitive to touch in terms of pain.  On Vicodin at home as needed.  Pulled NGT out, unable to be replaced.  . 5/5: Unable to replace NGT since yesterday, one large bright red bloody emesis overnight, remains on IV PPI BID. S/p 2 u PRBC for Hgb 7.3-> 5.6, pending repeat H/H. tmax 101.9. Down to NE 1 mcg, weaning. Remains on dex 0.4 and confused, c/o of pain all over, denies SOB Remains on room air . UOP 1.7L /24 hr .    Interim History / Subjective:    5/6  -still with ongoing fevers.  T-max almost 103 Fahrenheit.  No active bleeding.  Continues on Precedex.  Neurologic status appears better.  On room air.  No NG or OG tube.  Objective   Blood pressure (!) 120/52, pulse (!) 116, temperature 98.9 F (37.2 C), temperature source Axillary, resp. rate (!) 29, weight 85.2 kg, SpO2 97 %.        Intake/Output Summary (Last 24 hours) at 03/14/2021 0920 Last data filed at 03/14/2021 1497 Gross per 24 hour  Intake 2305.74 ml  Output 3046 ml  Net -740.26 ml   Filed Weights   03/10/21 0500 03/12/21 0446 03/13/21 0500  Weight: 76.2 kg 86.2 kg 85.2 kg    Examination:    Labs/imaging that I havepersonally reviewed  (right click and "Reselect all SmartList Selections" daily)  Chest x-ray 5/4 shows bibasal infiltrates and effusions. No CXR today   No BMET CBC-> Hgb 7.3-> 5.6, WBC 21.1-> 20.1   4/29 MRSA neg 5/1 UC >> neg 5/1 BCx >>  5/3 LUQ drain cx >>  4/27 cefotetan 4/29 zoysn >> 5/4 5/3 anidulafungin >>   Resolved Hospital Problem list     Assessment & Plan:   Anemia/ ABLA -concern for blood loss 03/13/2021  - 03/11/21: has had melena and coffee ground output from NGT.  ? Gastritis vs other upper GI.  2u PRBC ordered, starting transfusion shortly. And s/p - Transfuse 2u PRBC.  lovenox held -03/12/2021: No active bleeding.  Pressor requirement is low but has not responded adequately to the 2 units of PRBC from yesterday - 5/5 large bloody emesis overnight, Hgb  7.3-> 5.6 s/p 2 units PRBC.  Status post GI consult. -03/14/2021: No active bleeding.  GI planning endoscopy possibly today  Plan -Possible endoscopy by GI -Await H. pylori antigen test from 03/13/2021 -On infusion 03/15/2019 2x1 dose per GI -- PRBC for hgb </= 6.9gm%    - exceptions are   -  if ACS susepcted/confirmed then transfuse for hgb </= 8.0gm%,  or    -  active bleeding with hemodynamic instability, then transfuse regardless of hemoglobin value   At at all times try to transfuse 1 unit prbc as possible with exception of active hemorrhage     Shock- multifactorial, initially thought to be septic, now likely septic (given purulent LUQ drainage, possibly fungal, and suspected ABLA/ hemorraghic given ongoing anemia and bloody emesis  03/14/2021: Off Levophed  Plan - MAP goal > 65   Severe sepsis   -Abundant enterococcucs in LUQ drain   5/6 - Ongoing fevers  Plan - s/p 5 days of zosyn, remains on Eraxis  - follow culture data     AKI -   03/14/2021: Improvement in creatinine making good urine.  But hyponatremic  Plan  - BMET in am  - Continue supportive care. - Avoid contrast and other nephrotoxins.   Electrolyte imbalance  03/14/2021: Hypernatremic  Plan BMET in am  Start D5 water 03/14/2021   Delirium /Acute metabolic enephalopathy- likely ICU delirium.  -03/14/2021: Significant improvement but still maintains on Precedex Plan - continue low dose precedex infusion prn  -Haldol as needed with QTC monitoring - prn fentanyl for pain  - delirium precautions  -DC Benadryl As needed -Melatonin at night since 03/11/2021  Acute respiratory distress - this appeared to be related to ileus and bibasilar atelectasis.  Bilateral pleural effusion noted as well 5/4  03/14/2021 - on RA without distress and improved with improved agitation  Plan - encourage ongoing aggressive pulm hygiene / IS, mobilize as able  - intermittent CXR as needed   Ileus. - Continue TPN since  03/10/2021 - per CCS   Rectal CA - s/p resection with end colostomy. - Per surgery. - F/u with oncology.  Goals of care  03/14/2021: Continues to be full code  Plan - Recommend palliative care consultation for goals of care    Shane [name]   The patient Shane [name] is critically ill with multiple organ systems failure and requires high complexity decision making for assessment and support, frequent evaluation and titration of therapies, application of advanced monitoring technologies and extensive interpretation of multiple databases.   Critical Care Time devoted to patient care services described in this note is  40  Minutes. This time reflects time of care of this signee Dr Brand Males. This critical care time does not reflect procedure time, or teaching time or supervisory time of PA/NP/Med student/Med Resident etc but could involve care discussion time     Dr. Brand Males, M.D., [facility]C.P Pulmonary and Critical Care  Medicine Staff [place] [facility] Pager: [phone], If no answer or between  15:00h - 7:00h: call [phone]  03/14/2021 9:21 AM    LABS    PULMONARY Recent Labs  Lab 03/08/21 1313 03/11/21 0424  PHART 7.362 7.563*  PCO2ART 26.4* 32.0  PO2ART 93.2 52.8*  HCO3 14.6* 29.0*  O2SAT 96.7 88.4    CBC Recent Labs  Lab 03/12/21 0942 03/13/21 0057 03/13/21 1114 03/14/21 0237  HGB 7.3* 5.6* 7.7* 7.2*  HCT 20.9* 16.3* 22.6* 22.8*  WBC 21.1* 20.1*  --  20.1*  PLT 156 155  --  188    COAGULATION No results for input(s): INR in the last 168 hours.  CARDIAC  No results for input(s): TROPONINI in the last 168 hours. No results for input(s): PROBNP in the last 168 hours.   CHEMISTRY Recent Labs  Lab 03/08/21 0520 03/09/21 0211 03/11/21 0231 03/11/21 1738 03/12/21 0539 03/12/21 1840 03/13/21 0517 03/13/21 1005 03/14/21 0237  NA 137   < > 147*  --  148* 149* 152* 151*  154*  K 4.2   < > 3.2*   < > 3.3* 3.5 3.8 3.7 3.8  CL 111   < > 111  --  114* 118* 122* 121* 125*  CO2 17*   < > 27  --  26 24 24 24  21*  GLUCOSE 99   < > 140*  --  128* 150* 151* 147* 168*  BUN 49*   < > 58*  --  39* 40* 52* 54* 46*  CREATININE 2.97*   < > 1.48*  --  1.40* 1.21 1.29* 1.28* 1.45*  CALCIUM 8.3*   < > 8.1*  --  8.1* 7.9* 7.9* 7.8* 8.0*  MG 1.8   < > 2.2  --  2.2  --  2.3 2.2 2.2  PHOS 3.9  --  1.7*  --  3.2  --  2.9 3.0  --    < > = values in this interval not displayed.   Estimated Creatinine Clearance: 48.2 mL/min (A) (by C-G formula based on SCr of 1.45 mg/dL (H)).   LIVER Recent Labs  Lab 03/08/21 0520 03/09/21 0211 03/11/21 0231 03/13/21 0517  AST 41 35 30 27  ALT 30 30 22 19   ALKPHOS 62 41 46 71  BILITOT 1.1 0.9 1.2 0.7  PROT 5.4* 5.8* 5.1* 5.4*  ALBUMIN 2.5* 2.7* 2.2* 2.1*     INFECTIOUS Recent Labs  Lab 03/11/21 0450 03/11/21 0809 03/12/21 0942 03/13/21 0517 03/14/21 0237  LATICACIDVEN 2.3* 2.9* 1.7  --   --   PROCALCITON  --   --  13.73 6.53 4.77     ENDOCRINE CBG (last 3)  Recent Labs    03/13/21 2353 03/14/21 0458 03/14/21 0735  GLUCAP 144* 157* 136*         IMAGING x48h  - image(s) personally visualized  -   highlighted in bold DG CHEST PORT 1 VIEW  Result Date: 03/12/2021 CLINICAL DATA:  Aspiration EXAM: PORTABLE CHEST 1 VIEW COMPARISON:  03/12/2021 FINDINGS: Single frontal view of the chest demonstrates a stable right chest wall port and right-sided PICC. Cardiac silhouette is stable. Persistent bibasilar veiling opacities unchanged since prior exam. No pneumothorax. Pigtail drain partially visualized within the left upper quadrant abdomen. IMPRESSION: 1. Stable bibasilar veiling opacities consistent with consolidation and effusion. Superimposed aspiration would be difficult to exclude. Continued radiographic follow-up is recommended. Electronically Signed  By: Randa Ngo M.D.   On: 03/12/2021 20:20   DG CHEST PORT 1  VIEW  Result Date: 03/12/2021 CLINICAL DATA:  Respiratory distress EXAM: PORTABLE CHEST 1 VIEW COMPARISON:  03/09/2021 FINDINGS: Cardiac shadow is stable. Chest wall port is again noted on the right. Right-sided PICC line is noted in the mid right atrium and could be withdrawn as clinically necessary. Bilateral pleural effusions are again seen and stable. No new focal infiltrate is noted. IMPRESSION: Stable bilateral effusions with underlying atelectatic changes. New right-sided PICC line is in the midportion of the right atrium and could be withdrawn as clinically necessary. Electronically Signed   By: Inez Catalina M.D.   On: 03/12/2021 11:59   DG Abd Portable 2V  Result Date: 03/12/2021 CLINICAL DATA:  Follow-up ileus EXAM: PORTABLE ABDOMEN - 2 VIEW COMPARISON:  03/11/2021 FINDINGS: Paucity of bowel gas is noted. Left-sided end colostomy is seen. Surgical drain is noted deep in the pelvis as well as a percutaneous drain in the upper abdomen on the left. No free air is seen. Small bilateral pleural effusions are noted. IMPRESSION: Paucity of bowel gas consistent with fluid-filled loops of bowel. Electronically Signed   By: Inez Catalina M.D.   On: 03/12/2021 12:01

## 2021-03-14 NOTE — TOC Progression Note (Signed)
Transition of Care [facility]) - Progression Note    Patient Details  Name: Shane [name] MRN: [id number] Date of Birth: Apr 18, 1951  Transition of [facility]) CM/SW Contact  Leeroy Cha, RN Phone Number: 03/14/2021, 7:45 AM  Clinical Narrative:    Rectal carcinoma ([place])  Subjective: Unable to replace NG yesterday.  Alert and answering questions, somewhat confused.  Feels pain all over.  Had a fever last night.  Had some bloody emesis as well.  Hgb down again today  Objective: Vital signs in last 24 hours: Temp:  [98 F (36.7 C)-101.9 F (38.8 C)] 98.4 F (36.9 C) (05/05 0800) Pulse Rate:  [95-113] 113 (05/04 2300) Resp:  [23-43] 34 (05/05 0700) BP: (80-146)/(16-84) 93/34 (05/05 0700) SpO2:  [93 %-100 %] 99 % (05/05 0745) Weight:  [85.2 kg] 85.2 kg (05/05 0500) Last BM Date: 03/13/21  Intake/Output from previous day: 05/04 0701 - 05/05 0700 In: 3870.3 [I.V.:2666.1; Blood:360.4; IV Piggyback:828.8] Out: 4222 [Urine:1750; Emesis/NG output:550; Drains:162; Stool:1760] Intake/Output this shift: No intake/output data recorded.  General appearance: alert and cooperative GI: normal findings: soft, TTP diffusely, no peritonitis Incision/Wound: clean, dry, intact PLAN: to returnb to home may need hhc. Iv abx x2, iv levophed, iv iron, protonix, iv tpn  Expected Discharge Plan: [place] Barriers to Discharge: Continued Medical Work up  Expected Discharge Plan and Services Expected Discharge Plan: [place]   Discharge Planning Services: CM Consult   Living arrangements for the past 2 months: Single Family Home                                       Social Determinants of Health (SDOH) Interventions    Readmission Risk Interventions No flowsheet data found.

## 2021-03-14 NOTE — Progress Notes (Signed)
PHARMACY - TOTAL PARENTERAL NUTRITION CONSULT NOTE   Indication: Prolonged ileus  Patient Measurements: Weight: 85.2 kg (187 lb 13.3 oz)   Body mass index is 26.95 kg/m.   Assessment: 70 yo male with rectal cancer s/p xi robotic assisted lower anterior resection with end colostomy on 4/27 now with prolonged ileus to start TPN on 5/2  Glucose / Insulin: CBGs continue to be at goal < 150 Electrolytes: Na further increase to 154.  K 3.8 inc to WNL.  Cl elevated 125, CO2 WNL. Renal: SCr 1.45 up some, BUN 46 improved Hepatic: AST/ALT WNL stable, tbili 1.2, alkphos 46 Albumin/prealbumin: alb 2.2 (5/3), prealb <5 (5/3) Intake / Output; MIVF: I/O last 24hr net +230 mL.  (stool output slowed to 450 mL, decreased drain output of only 96 mL and UOP of 1900 mL).  IVF d/c per MD. GI Imaging: GI Surgeries / Procedures:  4/27:  xi robotic assisted lower anterior resection with end colostomy on 4/27 5/6: NGT out and unable to be replaced thus far  Central access: 5/2 TPN start date: 5/2  Nutritional Goals (per RD recommendation on 5/3): 2200-2400 kcal/day, protein 90-115 g/day, and >2L/day  Goal TPN rate is 90 mL/hr (provides 114 g of protein and 2199 kcals per day)  Current Nutrition:  NPO and TPN  Plan:   Continue TPN at goal 68mL/hr at 1800  Electrolytes in TPN: Na removed, K 43mEq/L, Ca 63mEq/L, Mg 25mEq/L, and Phos 41mmol/L. Cl:Ac max Ac  Add standard MVI and trace elements to TPN  Continue Sensitive q4h SSI and adjust as needed   IV fluids off per Md orders  Monitor TPN labs on Mon/Thurs and as needed.   Adrian Saran, PharmD, BCPS Secure Chat if ?s 03/14/2021 8:35 AM

## 2021-03-14 NOTE — Anesthesia Preprocedure Evaluation (Addendum)
Anesthesia Evaluation  Patient identified by MRN, date of birth, ID band Patient confused    Reviewed: Allergy & Precautions, NPO status , Patient's Chart, lab work & pertinent test results  Airway Mallampati: II  TM Distance: >3 FB Neck ROM: Full    Dental no notable dental hx.    Pulmonary neg pulmonary ROS, former smoker,    breath sounds clear to auscultation+ rhonchi  + decreased breath sounds      Cardiovascular negative cardio ROS Normal cardiovascular exam Rhythm:Regular Rate:Normal     Neuro/Psych Acute MS changesnegative neurological ROS     GI/Hepatic Neg liver ROS, Rectal cancer   Endo/Other  negative endocrine ROS  Renal/GU Renal InsufficiencyRenal disease  negative genitourinary   Musculoskeletal negative musculoskeletal ROS (+)   Abdominal   Peds negative pediatric ROS (+)  Hematology  (+) anemia , Acute blood loss   Anesthesia Other Findings   Reproductive/Obstetrics negative OB ROS                            Anesthesia Physical Anesthesia Plan  ASA: III  Anesthesia Plan: MAC   Post-op Pain Management:    Induction: Intravenous  PONV Risk Score and Plan: 1 and Propofol infusion and Treatment may vary due to age or medical condition  Airway Management Planned: Simple Face Mask  Additional Equipment:   Intra-op Plan:   Post-operative Plan:   Informed Consent: I have reviewed the patients History and Physical, chart, labs and discussed the procedure including the risks, benefits and alternatives for the proposed anesthesia with the patient or authorized representative who has indicated his/her understanding and acceptance.     Dental advisory given  Plan Discussed with: CRNA and Surgeon  Anesthesia Plan Comments: (Patient s/p low anterior resexn 03/05/21, subesequent complicated post-op course. Mildly confused, no hematemesis today)       Anesthesia  Quick Evaluation

## 2021-03-14 NOTE — Progress Notes (Signed)
PT Cancellation Note  Patient Details Name: Shane [name] MRN: [id number] DOB: [birth year]   Cancelled Treatment:     Pt present with persistent anemia despite blood transfusions.  Scheduled for EGD today.  Will continue to monitor and attempt to see as schedule and medical permits.   Nathanial Rancher 03/14/2021, 9:39 AM

## 2021-03-14 NOTE — Interval H&P Note (Signed)
History and Physical Interval Note:  03/14/2021 2:15 PM  Shane [name]  has presented today for surgery, with the diagnosis of Hematemesis, anemia.  The various methods of treatment have been discussed with the patient and family. After consideration of risks, benefits and other options for treatment, the patient has consented to  Procedure(s): ESOPHAGOGASTRODUODENOSCOPY (EGD) WITH PROPOFOL (N/A) as a surgical intervention.  The patient's history has been reviewed, patient examined, no change in status, stable for surgery.  I have reviewed the patient's chart and labs.  Questions were answered to the patient's satisfaction.     Dominic Pea Jourdan Durbin

## 2021-03-15 LAB — MAGNESIUM: Magnesium: 2.1 mg/dL (ref 1.7–2.4)

## 2021-03-15 LAB — GLUCOSE, CAPILLARY
Glucose-Capillary: 119 mg/dL — ABNORMAL HIGH (ref 70–99)
Glucose-Capillary: 119 mg/dL — ABNORMAL HIGH (ref 70–99)
Glucose-Capillary: 120 mg/dL — ABNORMAL HIGH (ref 70–99)
Glucose-Capillary: 124 mg/dL — ABNORMAL HIGH (ref 70–99)
Glucose-Capillary: 131 mg/dL — ABNORMAL HIGH (ref 70–99)
Glucose-Capillary: 135 mg/dL — ABNORMAL HIGH (ref 70–99)
Glucose-Capillary: 146 mg/dL — ABNORMAL HIGH (ref 70–99)

## 2021-03-15 LAB — DIFFERENTIAL
Band Neutrophils: 9 %
Basophils Relative: 0 %
Blasts: NONE SEEN %
Eosinophils Relative: 0 %
Lymphocytes Relative: 1 %
Metamyelocytes Relative: NONE SEEN %
Monocytes Relative: 6 %
Myelocytes: NONE SEEN %
Neutrophils Relative %: 84 %
Promyelocytes Relative: NONE SEEN %
RBC Morphology: NORMAL
WBC Morphology: NORMAL
nRBC: 20 /100 WBC — ABNORMAL HIGH

## 2021-03-15 LAB — CBC
HCT: 25.7 % — ABNORMAL LOW (ref 39.0–52.0)
Hemoglobin: 8.2 g/dL — ABNORMAL LOW (ref 13.0–17.0)
MCH: 29.1 pg (ref 26.0–34.0)
MCHC: 31.9 g/dL (ref 30.0–36.0)
MCV: 91.1 fL (ref 80.0–100.0)
Platelets: 191 10*3/uL (ref 150–400)
RBC: 2.82 MIL/uL — ABNORMAL LOW (ref 4.22–5.81)
RDW: 18.4 % — ABNORMAL HIGH (ref 11.5–15.5)
WBC: 11.1 10*3/uL — ABNORMAL HIGH (ref 4.0–10.5)

## 2021-03-15 LAB — COMPREHENSIVE METABOLIC PANEL
ALT: 40 U/L (ref 0–44)
AST: 53 U/L — ABNORMAL HIGH (ref 15–41)
Albumin: 2 g/dL — ABNORMAL LOW (ref 3.5–5.0)
Alkaline Phosphatase: 84 U/L (ref 38–126)
Anion gap: 6 (ref 5–15)
BUN: 36 mg/dL — ABNORMAL HIGH (ref 8–23)
CO2: 22 mmol/L (ref 22–32)
Calcium: 8.3 mg/dL — ABNORMAL LOW (ref 8.9–10.3)
Chloride: 128 mmol/L — ABNORMAL HIGH (ref 98–111)
Creatinine, Ser: 1.27 mg/dL — ABNORMAL HIGH (ref 0.61–1.24)
GFR, Estimated: >60 mL/min (ref >60)
Glucose, Bld: 132 mg/dL — ABNORMAL HIGH (ref 70–99)
Potassium: 3.8 mmol/L (ref 3.5–5.1)
Sodium: 156 mmol/L — ABNORMAL HIGH (ref 135–145)
Total Bilirubin: 0.7 mg/dL (ref 0.3–1.2)
Total Protein: 5.8 g/dL — ABNORMAL LOW (ref 6.5–8.1)

## 2021-03-15 LAB — BRAIN NATRIURETIC PEPTIDE: B Natriuretic Peptide: 188.7 pg/mL — ABNORMAL HIGH (ref 0.0–100.0)

## 2021-03-15 LAB — PHOSPHORUS: Phosphorus: 2.8 mg/dL (ref 2.5–4.6)

## 2021-03-15 MED ORDER — TRAVASOL 10 % IV SOLN
INTRAVENOUS | Status: AC
  Filled 2021-03-15: qty 1123.2

## 2021-03-15 MED ORDER — FUROSEMIDE 10 MG/ML IJ SOLN
40.0000 mg | Freq: Once | INTRAMUSCULAR | Status: AC
  Administered 2021-03-15: 40 mg via INTRAVENOUS
  Filled 2021-03-15: qty 4

## 2021-03-15 NOTE — Progress Notes (Signed)
PHARMACY - TOTAL PARENTERAL NUTRITION CONSULT NOTE   Indication: Prolonged ileus  Patient Measurements: Weight: 85.2 kg (187 lb 13.3 oz)   Body mass index is 26.95 kg/m.   Assessment: 70 yo male with rectal cancer s/p xi robotic assisted lower anterior resection with end colostomy on 4/27 now with prolonged ileus to start TPN on 5/2  Glucose / Insulin: CBGs continue to be at goal < 150 Electrolytes: Na further increase to 156.  K 3.8 stable.  Cl elevated 128 up, CO2 WNL. Renal: SCr 1.27 improved, BUN 36 improved Hepatic: AST/ALT 53/40 up, tbili 0.7, alkphos 84 Albumin/prealbumin: alb 2 (5/7), prealb <5 (5/3) Intake / Output; MIVF: I/O last 24hr net +257 mL. (stool output 979mL up, drain output up to 185 ml and UOP of 2100 mL). D5W at 50 ml/hr per Md, Lasix 40mg  IV x 1 this AM GI Imaging: GI Surgeries / Procedures:  4/27:  xi robotic assisted lower anterior resection with end colostomy on 4/27 5/6: NGT out and unable to be replaced thus far  Central access: 5/2 TPN start date: 5/2  Nutritional Goals (per RD recommendation on 5/3): 2200-2400 kcal/day, protein 90-115 g/day, and >2L/day  Goal TPN rate is 90 mL/hr (provides 114 g of protein and 2199 kcals per day)  Current Nutrition:  NPO and TPN  Plan:   Continue TPN at goal 30mL/hr at 1800  Electrolytes in TPN: Na removed, K 70mEq/L, Ca 16mEq/L, Mg 15mEq/L, and Phos 73mmol/L. Cl:Ac max Ac  Add standard MVI and trace elements to TPN  Continue Sensitive q4h SSI and adjust as needed   D5W at 50 ml/hr per Md  Monitor TPN labs on Mon/Thurs and as needed.  Note Na still elevated despite no Na or Cl in TPN and pt on D5W IVF's. Protonix infusion is in NS but minimal amount. If Na does not start to improve over next day or so, may have to start putting all IV drugs in D5W if possible   Adrian Saran, PharmD, BCPS Secure Chat if ?s 03/15/2021 10:46 AM

## 2021-03-15 NOTE — Progress Notes (Signed)
Rectal carcinoma (HCC)  Subjective: No major complaints this AM.  HGB stable after transfusion yesterday.  Objective: Vital signs in last 24 hours: Temp:  [98.2 F (36.8 C)-100.2 F (37.9 C)] 99.3 F (37.4 C) (05/07 0934) Pulse Rate:  [91-103] 93 (05/07 1000) Resp:  [16-44] 35 (05/07 1000) BP: (111-164)/(36-97) 162/55 (05/07 1000) SpO2:  [95 %-100 %] 100 % (05/07 1000) Last BM Date: 03/15/21  Intake/Output from previous day: 05/06 0701 - 05/07 0700 In: 3441.9 [P.O.:100; I.V.:2643.3; Blood:315; IV Piggyback:373.6] Out: 1950 [Urine:2100; Drains:185; Stool:900] Intake/Output this shift: Total I/O In: 1093.1 [P.O.:200; I.V.:759.6; IV Piggyback:133.5] Out: 250 [Urine:250]  General appearance: alert and cooperative GI: normal findings: soft, TTP diffusely, no peritonitis Incision/Wound: clean, dry, intact JP: clear Ostomy: stool in bag, beefy red LLQ drain: purulent output  Lab Results:  Results for orders placed or performed during the hospital encounter of 03/05/21 (from the past 24 hour(s))  Glucose, capillary     Status: Abnormal   Collection Time: 03/14/21 12:00 PM  Result Value Ref Range   Glucose-Capillary 146 (H) 70 - 99 mg/dL   Comment 1 Notify RN    Comment 2 Document in Chart   Glucose, capillary     Status: Abnormal   Collection Time: 03/14/21  3:33 PM  Result Value Ref Range   Glucose-Capillary 140 (H) 70 - 99 mg/dL  Hemoglobin and hematocrit, blood     Status: Abnormal   Collection Time: 03/14/21  6:00 PM  Result Value Ref Range   Hemoglobin 8.0 (L) 13.0 - 17.0 g/dL   HCT 24.8 (L) 39.0 - 52.0 %  Glucose, capillary     Status: Abnormal   Collection Time: 03/14/21  7:44 PM  Result Value Ref Range   Glucose-Capillary 138 (H) 70 - 99 mg/dL  Glucose, capillary     Status: Abnormal   Collection Time: 03/15/21 12:07 AM  Result Value Ref Range   Glucose-Capillary 146 (H) 70 - 99 mg/dL  Brain natriuretic peptide     Status: Abnormal   Collection Time:  03/15/21  1:29 AM  Result Value Ref Range   B Natriuretic Peptide 188.7 (H) 0.0 - 100.0 pg/mL  Magnesium     Status: None   Collection Time: 03/15/21  1:33 AM  Result Value Ref Range   Magnesium 2.1 1.7 - 2.4 mg/dL  CBC     Status: Abnormal   Collection Time: 03/15/21  1:33 AM  Result Value Ref Range   WBC 11.1 (H) 4.0 - 10.5 K/uL   RBC 2.82 (L) 4.22 - 5.81 MIL/uL   Hemoglobin 8.2 (L) 13.0 - 17.0 g/dL   HCT 25.7 (L) 39.0 - 52.0 %   MCV 91.1 80.0 - 100.0 fL   MCH 29.1 26.0 - 34.0 pg   MCHC 31.9 30.0 - 36.0 g/dL   RDW 18.4 (H) 11.5 - 15.5 %   Platelets 191 150 - 400 K/uL  Comprehensive metabolic panel     Status: Abnormal   Collection Time: 03/15/21  1:33 AM  Result Value Ref Range   Sodium 156 (H) 135 - 145 mmol/L   Potassium 3.8 3.5 - 5.1 mmol/L   Chloride 128 (H) 98 - 111 mmol/L   CO2 22 22 - 32 mmol/L   Glucose, Bld 132 (H) 70 - 99 mg/dL   BUN 36 (H) 8 - 23 mg/dL   Creatinine, Ser 1.27 (H) 0.61 - 1.24 mg/dL   Calcium 8.3 (L) 8.9 - 10.3 mg/dL   Total Protein 5.8 (L) 6.5 -  8.1 g/dL   Albumin 2.0 (L) 3.5 - 5.0 g/dL   AST 53 (H) 15 - 41 U/L   ALT 40 0 - 44 U/L   Alkaline Phosphatase 84 38 - 126 U/L   Total Bilirubin 0.7 0.3 - 1.2 mg/dL   GFR, Estimated >60 >60 mL/min   Anion gap 6 5 - 15  Phosphorus     Status: None   Collection Time: 03/15/21  1:33 AM  Result Value Ref Range   Phosphorus 2.8 2.5 - 4.6 mg/dL  Differential     Status: Abnormal   Collection Time: 03/15/21  1:33 AM  Result Value Ref Range   Neutrophils Relative % 84 %   Band Neutrophils 9 %   Lymphocytes Relative 1 %   Monocytes Relative 6 %   Eosinophils Relative 0 %   Basophils Relative 0 %   WBC Morphology NORMAL    RBC Morphology NORMAL    nRBC 20 (H) 0 /100 WBC   Metamyelocytes Relative NONE SEEN %   Myelocytes NONE SEEN %   Promyelocytes Relative NONE SEEN %   Blasts NONE SEEN %  Glucose, capillary     Status: Abnormal   Collection Time: 03/15/21  3:38 AM  Result Value Ref Range    Glucose-Capillary 135 (H) 70 - 99 mg/dL  Glucose, capillary     Status: Abnormal   Collection Time: 03/15/21  7:47 AM  Result Value Ref Range   Glucose-Capillary 131 (H) 70 - 99 mg/dL   Comment 1 Notify RN    Comment 2 Document in Chart      Studies/Results Radiology     MEDS, Scheduled . bacitracin   Topical BID  . chlorhexidine  15 mL Mouth Rinse BID  . Chlorhexidine Gluconate Cloth  6 each Topical Daily  . furosemide  40 mg Intravenous Once  . insulin aspart  0-9 Units Subcutaneous Q4H  . lip balm  1 application Topical BID  . mouth rinse  15 mL Mouth Rinse q12n4p  . melatonin  3 mg Oral QHS  . [START ON 03/17/2021] pantoprazole  40 mg Intravenous Q12H  . sodium chloride flush  10-40 mL Intracatheter Q12H  . sodium chloride flush  5 mL Intracatheter Q8H     Assessment: Rectal carcinoma (HCC) Post op abscess  Plan: Cont IVF's, TPN Acute blood loss anemia: stable since transfusion yesterday.   BNP elevated this morning, anemia may be in part dilutional due to large volume resuscitation around the time of surgery. -trial lasix today, continue to monitor ID: Cont IV abx, Eraxis, IV tylenol for fevers, wbc coming down  RT for pulm toilet   LOS: 10 days    Shane [name], [facility], [place]    03/15/2021 10:29 AM

## 2021-03-16 LAB — BASIC METABOLIC PANEL
Anion gap: 6 (ref 5–15)
BUN: 27 mg/dL — ABNORMAL HIGH (ref 8–23)
CO2: 22 mmol/L (ref 22–32)
Calcium: 8.2 mg/dL — ABNORMAL LOW (ref 8.9–10.3)
Chloride: 120 mmol/L — ABNORMAL HIGH (ref 98–111)
Creatinine, Ser: 1.28 mg/dL — ABNORMAL HIGH (ref 0.61–1.24)
GFR, Estimated: 60 mL/min — ABNORMAL LOW (ref >60)
Glucose, Bld: 134 mg/dL — ABNORMAL HIGH (ref 70–99)
Potassium: 4 mmol/L (ref 3.5–5.1)
Sodium: 148 mmol/L — ABNORMAL HIGH (ref 135–145)

## 2021-03-16 LAB — GLUCOSE, CAPILLARY
Glucose-Capillary: 109 mg/dL — ABNORMAL HIGH (ref 70–99)
Glucose-Capillary: 114 mg/dL — ABNORMAL HIGH (ref 70–99)
Glucose-Capillary: 114 mg/dL — ABNORMAL HIGH (ref 70–99)
Glucose-Capillary: 118 mg/dL — ABNORMAL HIGH (ref 70–99)
Glucose-Capillary: 119 mg/dL — ABNORMAL HIGH (ref 70–99)

## 2021-03-16 LAB — CBC
HCT: 27.4 % — ABNORMAL LOW (ref 39.0–52.0)
Hemoglobin: 8.6 g/dL — ABNORMAL LOW (ref 13.0–17.0)
MCH: 28.6 pg (ref 26.0–34.0)
MCHC: 31.4 g/dL (ref 30.0–36.0)
MCV: 91 fL (ref 80.0–100.0)
Platelets: 271 10*3/uL (ref 150–400)
RBC: 3.01 MIL/uL — ABNORMAL LOW (ref 4.22–5.81)
RDW: 18.3 % — ABNORMAL HIGH (ref 11.5–15.5)
WBC: 14.2 10*3/uL — ABNORMAL HIGH (ref 4.0–10.5)
nRBC: 7.6 % — ABNORMAL HIGH (ref 0.0–0.2)

## 2021-03-16 LAB — PHOSPHORUS: Phosphorus: 3.5 mg/dL (ref 2.5–4.6)

## 2021-03-16 LAB — MAGNESIUM: Magnesium: 2 mg/dL (ref 1.7–2.4)

## 2021-03-16 MED ORDER — TRAVASOL 10 % IV SOLN
INTRAVENOUS | Status: AC
  Filled 2021-03-16: qty 1123.2

## 2021-03-16 MED ORDER — INSULIN ASPART 100 UNIT/ML IJ SOLN
0.0000 [IU] | Freq: Four times a day (QID) | INTRAMUSCULAR | Status: DC
  Administered 2021-03-17 (×3): 1 [IU] via SUBCUTANEOUS

## 2021-03-16 MED ORDER — FUROSEMIDE 10 MG/ML IJ SOLN
20.0000 mg | Freq: Once | INTRAMUSCULAR | Status: AC
  Administered 2021-03-16: 20 mg via INTRAVENOUS
  Filled 2021-03-16: qty 2

## 2021-03-16 NOTE — Progress Notes (Signed)
Referring Physician(s): Dr Thermon Leyland  Supervising Physician: Jacqulynn Cadet  Patient Status:  [facility] - In-pt  Chief Complaint:  Rectal carcinoma  Post op abscess IR Drain placed 03/11/21  Subjective:  alert today Answers all questions appropriately Communicating well  OP from IR LUQ abscess drain is milky yellow Site is c/d/i    Allergies: Nsaids  Medications: Prior to Admission medications   Medication Sig Start Date End Date Taking? Authorizing Provider  Diclofenac Sodium 2 % SOLN Place 2 g onto the skin 2 (two) times daily. Patient taking differently: Place 2 g onto the skin daily as needed (pain). 06/30/19  Yes Lyndal Pulley, DO  docusate sodium (COLACE) 100 MG capsule Take 100 mg by mouth daily as needed for mild constipation.    Yes [provider]  HYDROcodone-acetaminophen (NORCO/VICODIN) 5-325 MG tablet Take 1 tablet by mouth every 6 (six) hours as needed for moderate pain. 02/12/21  Yes Owens Shark, NP  hydrocortisone (ANUSOL-HC) 2.5 % rectal cream Place 1 application rectally 2 (two) times daily. Patient taking differently: Place 1 application rectally daily as needed for hemorrhoids or anal itching. 01/28/21  Yes Marrian Salvage, FNP  lidocaine-prilocaine (EMLA) cream Apply 1 application topically as needed. Patient taking differently: Apply 1 application topically as needed (prior to port use). 10/21/20  Yes Ladell Pier, MD  pravastatin (PRAVACHOL) 20 MG tablet TAKE 1 TABLET(20 MG) BY MOUTH DAILY Patient taking differently: Take 20 mg by mouth daily. 11/12/20  Yes Marrian Salvage, FNP  capecitabine (XELODA) 500 MG tablet TAKE 2000 MG (4 TABLETS) EVERY MORNING AND 1500 MG (3 TABLETS) EVERY EVENING FOR A TOTAL DAILY DOSE OF 3500 MG ON RADIATION DAYS ONLY. Patient not taking: No sig reported 11/04/20 11/04/21  Ladell Pier, MD  diphenoxylate-atropine (LOMOTIL) 2.5-0.025 MG tablet Take 1-2 tablets by mouth 3 (three) times daily  as needed for diarrhea or loose stools (Maxiumum of 6/day). Patient not taking: No sig reported 12/25/20   Ladell Pier, MD  ondansetron (ZOFRAN) 8 MG tablet Take 1 tablet (8 mg total) by mouth every 8 (eight) hours as needed for nausea or vomiting. Start 72 hours after each IV chemotherapy treatment 07/30/20   Ladell Pier, MD  prochlorperazine (COMPAZINE) 10 MG tablet Take 1 tablet (10 mg total) by mouth every 6 (six) hours as needed for nausea. 07/30/20   Ladell Pier, MD     Vital Signs: BP (!) 142/50 (BP Location: Left Arm)   Pulse 98   Temp 99 F (37.2 C) (Oral)   Resp (!) 33   Wt 186 lb 8.2 oz (84.6 kg)   SpO2 99%   BMI 26.76 kg/m   Physical Exam Skin:    General: Skin is warm.     Comments: LUQ abscess drain intact Draining well Milky brown fluid 160 cc yesterday Organism ID, Bacteria ENTEROCOCCUS FAECALIS       Imaging: DG CHEST PORT 1 VIEW  Result Date: 03/12/2021 CLINICAL DATA:  Aspiration EXAM: PORTABLE CHEST 1 VIEW COMPARISON:  03/12/2021 FINDINGS: Single frontal view of the chest demonstrates a stable right chest wall port and right-sided PICC. Cardiac silhouette is stable. Persistent bibasilar veiling opacities unchanged since prior exam. No pneumothorax. Pigtail drain partially visualized within the left upper quadrant abdomen. IMPRESSION: 1. Stable bibasilar veiling opacities consistent with consolidation and effusion. Superimposed aspiration would be difficult to exclude. Continued radiographic follow-up is recommended. Electronically Signed   By: Randa Ngo M.D.   On:  03/12/2021 20:20    Labs:  CBC: Recent Labs    03/14/21 0237 03/14/21 0919 03/14/21 1800 03/15/21 0133 03/16/21 0246  WBC 20.1* 18.2*  --  11.1* 14.2*  HGB 7.2* 6.7* 8.0* 8.2* 8.6*  HCT 22.8* 21.0* 24.8* 25.7* 27.4*  PLT 188 178  --  191 271    COAGS: No results for input(s): INR, APTT in the last 8760 hours.  BMP: Recent Labs    07/04/20 0845 07/15/20 0048  08/13/20 0906 03/13/21 1005 03/14/21 0237 03/15/21 0133 03/16/21 0246  NA 138 136   < > 151* 154* 156* 148*  K 3.8 3.7   < > 3.7 3.8 3.8 4.0  CL 108 106   < > 121* 125* 128* 120*  CO2 25 21*   < > 24 21* 22 22  GLUCOSE 105* 100*   < > 147* 168* 132* 134*  BUN 12 21   < > 54* 46* 36* 27*  CALCIUM 9.9 9.2   < > 7.8* 8.0* 8.3* 8.2*  CREATININE 0.96 0.94   < > 1.28* 1.45* 1.27* 1.28*  GFRNONAA >60 >60   < > 60* 52* >60 60*  GFRAA >60 >60  --   --   --   --   --    < > = values in this interval not displayed.    LIVER FUNCTION TESTS: Recent Labs    03/09/21 0211 03/11/21 0231 03/13/21 0517 03/15/21 0133  BILITOT 0.9 1.2 0.7 0.7  AST 35 30 27 53*  ALT 30 22 19  40  ALKPHOS 41 46 71 84  PROT 5.8* 5.1* 5.4* 5.8*  ALBUMIN 2.7* 2.2* 2.1* 2.0*    Assessment and Plan:  LUQ drain intact OP milky yellow Will follow  Electronically Signed: Lavonia Drafts, PA-C 03/16/2021, 12:52 PM   I spent a total of 15 Minutes at the the patient's bedside AND on the patient's hospital floor or unit, greater than 50% of which was counseling/coordinating care for LUQ absc drain

## 2021-03-16 NOTE — Progress Notes (Signed)
Rectal carcinoma ([place])  Subjective: Pain at about a 6 this morning.  Continued drainage from IR and JP drains.  Thin brown liquid out of ostomy.  3 L urine after lasix yesterday morning.  Objective: Vital signs in last 24 hours: Temp:  [98.7 F (37.1 C)-100.1 F (37.8 C)] 100.1 F (37.8 C) (05/08 0400) Pulse Rate:  [88-108] 96 (05/08 0500) Resp:  [21-46] 21 (05/08 0500) BP: (134-164)/(30-65) 150/41 (05/08 0500) SpO2:  [97 %-100 %] 98 % (05/08 0500) Weight:  [84.6 kg] 84.6 kg (05/08 0500) Last BM Date: 03/15/21  Intake/Output from previous day: 05/07 0701 - 05/08 0700 In: 3243.6 [P.O.:300; I.V.:2714.4; IV Piggyback:224.2] Out: 4790 [Urine:3875; Drains:165; Stool:750] Intake/Output this shift: Total I/O In: 490.5 [I.V.:460.2; Other:5; IV Piggyback:25.3] Out: 1290 [Urine:775; Drains:65; Stool:450]  General appearance: alert and cooperative GI: Phannensteil incision with a little drainage from left lateral aspect, JP drain murky but a little clearer than yesterday, IR drain with purulent drainage Ostomy: liquid stool in bag, beefy red mucosa  Lab Results:  Results for orders placed or performed during the hospital encounter of 03/05/21 (from the past 24 hour(s))  Glucose, capillary     Status: Abnormal   Collection Time: 03/15/21  7:47 AM  Result Value Ref Range   Glucose-Capillary 131 (H) 70 - 99 mg/dL   Comment 1 Notify RN    Comment 2 Document in Chart   Glucose, capillary     Status: Abnormal   Collection Time: 03/15/21 12:19 PM  Result Value Ref Range   Glucose-Capillary 119 (H) 70 - 99 mg/dL   Comment 1 Notify RN    Comment 2 Document in Chart   Glucose, capillary     Status: Abnormal   Collection Time: 03/15/21  4:55 PM  Result Value Ref Range   Glucose-Capillary 124 (H) 70 - 99 mg/dL  Glucose, capillary     Status: Abnormal   Collection Time: 03/15/21  7:52 PM  Result Value Ref Range   Glucose-Capillary 119 (H) 70 - 99 mg/dL  Glucose, capillary     Status:  Abnormal   Collection Time: 03/15/21 11:08 PM  Result Value Ref Range   Glucose-Capillary 120 (H) 70 - 99 mg/dL  CBC     Status: Abnormal   Collection Time: 03/16/21  2:46 AM  Result Value Ref Range   WBC 14.2 (H) 4.0 - 10.5 K/uL   RBC 3.01 (L) 4.22 - 5.81 MIL/uL   Hemoglobin 8.6 (L) 13.0 - 17.0 g/dL   HCT 27.4 (L) 39.0 - 52.0 %   MCV 91.0 80.0 - 100.0 fL   MCH 28.6 26.0 - 34.0 pg   MCHC 31.4 30.0 - 36.0 g/dL   RDW 18.3 (H) 11.5 - 15.5 %   Platelets 271 150 - 400 K/uL   nRBC 7.6 (H) 0.0 - 0.2 %  Basic metabolic panel     Status: Abnormal   Collection Time: 03/16/21  2:46 AM  Result Value Ref Range   Sodium 148 (H) 135 - 145 mmol/L   Potassium 4.0 3.5 - 5.1 mmol/L   Chloride 120 (H) 98 - 111 mmol/L   CO2 22 22 - 32 mmol/L   Glucose, Bld 134 (H) 70 - 99 mg/dL   BUN 27 (H) 8 - 23 mg/dL   Creatinine, Ser 1.28 (H) 0.61 - 1.24 mg/dL   Calcium 8.2 (L) 8.9 - 10.3 mg/dL   GFR, Estimated 60 (L) >60 mL/min   Anion gap 6 5 - 15  Magnesium  Status: None   Collection Time: 03/16/21  2:46 AM  Result Value Ref Range   Magnesium 2.0 1.7 - 2.4 mg/dL  Phosphorus     Status: None   Collection Time: 03/16/21  2:46 AM  Result Value Ref Range   Phosphorus 3.5 2.5 - 4.6 mg/dL  Glucose, capillary     Status: Abnormal   Collection Time: 03/16/21  3:03 AM  Result Value Ref Range   Glucose-Capillary 114 (H) 70 - 99 mg/dL     Studies/Results Radiology     MEDS, Scheduled . bacitracin   Topical BID  . chlorhexidine  15 mL Mouth Rinse BID  . Chlorhexidine Gluconate Cloth  6 each Topical Daily  . furosemide  20 mg Intravenous Once  . insulin aspart  0-9 Units Subcutaneous Q4H  . lip balm  1 application Topical BID  . mouth rinse  15 mL Mouth Rinse q12n4p  . melatonin  3 mg Oral QHS  . [START ON 03/17/2021] pantoprazole  40 mg Intravenous Q12H  . sodium chloride flush  10-40 mL Intracatheter Q12H  . sodium chloride flush  5 mL Intracatheter Q8H     Assessment: Rectal carcinoma  (HCC) Post op abscess  Plan: Cont IVF's, TPN Acute blood loss anemia: stable since transfusion 5/6.  Responded well to lasix yesterday, slight increase in HGB anemia in part dilutional due to large volume resuscitation around the time of surgery. Give 20 more IV lasix this AM ID: Cont IV abx, Eraxis, IV tylenol for fevers, wbc up a little this morning - monitor RT for pulm toilet   LOS: 11 days    Felicie Morn, [facility], [place]    03/16/2021 6:17 AM

## 2021-03-16 NOTE — Progress Notes (Signed)
Small white area noticed around bottom of stoma measuring about 1-2 cm. Surgery MD made aware, will continue to monitor.

## 2021-03-16 NOTE — Progress Notes (Signed)
PHARMACY - TOTAL PARENTERAL NUTRITION CONSULT NOTE   Indication: Prolonged ileus  Patient Measurements: Weight: 84.6 kg (186 lb 8.2 oz)   Body mass index is 26.76 kg/m.   Assessment: 70 yo male with rectal cancer s/p xi robotic assisted lower anterior resection with end colostomy on 4/27 now with prolonged ileus to start TPN on 5/2  Glucose / Insulin: CBGs continue to be at goal < 150 Electrolytes: Na much improved - 148.  K 4 stable. Mag/phos WNL. Cl improved to 120, CO2 WNL. Renal: SCr 1.28 stable, BUN 27 improved Hepatic: AST/ALT 53/40 up, tbili 0.7, alkphos 84 Albumin/prealbumin: alb 2 (5/7), prealb <5 (5/3) Intake / Output; MIVF: I/O last 24hr net -1546 mL. (stool output 741mL, drain output 165 ml and UOP of 3875 mL after lasix x 1). D5W at 50 ml/hr per Md, Lasix 20mg  IV x 1 this AM GI Imaging: GI Surgeries / Procedures:  4/27:  xi robotic assisted lower anterior resection with end colostomy on 4/27 5/6: NGT out and unable to be replaced thus far  Central access: 5/2 TPN start date: 5/2  Nutritional Goals (per RD recommendation on 5/3): 2200-2400 kcal/day, protein 90-115 g/day, and >2L/day  Goal TPN rate is 90 mL/hr (provides 114 g of protein and 2199 kcals per day)  Current Nutrition:  NPO and TPN  Plan:   Continue TPN at goal 74mL/hr at 1800  Electrolytes in TPN: Na removed, K 72mEq/L, Ca 65mEq/L, Mg 60mEq/L, and Phos 71mmol/L. Cl:Ac max Ac  Add standard MVI and trace elements to TPN  Continue Sensitive SSI but due to continued CBGs at goal will change from q4 to q6   D5W at 50 ml/hr per Md  Monitor TPN labs on Mon/Thurs and as needed.  With some meds being stopped, may need to add Na back in TPN depending on how long it continues  Adrian Saran, PharmD, BCPS Secure Chat if ?s 03/16/2021 9:13 AM

## 2021-03-17 ENCOUNTER — Other Ambulatory Visit: Payer: Medicare Other

## 2021-03-17 ENCOUNTER — Inpatient Hospital Stay: Payer: Medicare Other | Admitting: Nurse Practitioner

## 2021-03-17 ENCOUNTER — Inpatient Hospital Stay: Payer: Medicare Other

## 2021-03-17 ENCOUNTER — Ambulatory Visit
Admission: RE | Admit: 2021-03-17 | Discharge: 2021-03-17 | Disposition: A | Discharge status: Home / Self Care | Payer: Medicare Other | Source: Ambulatory Visit | Attending: Radiation Oncology | Admitting: Radiation Oncology

## 2021-03-17 ENCOUNTER — Inpatient Hospital Stay: Payer: Medicare Other | Attending: Physician Assistant

## 2021-03-17 ENCOUNTER — Encounter (HOSPITAL_COMMUNITY): Payer: Self-pay | Admitting: Gastroenterology

## 2021-03-17 DIAGNOSIS — C20 Malignant neoplasm of rectum: Secondary | ICD-10-CM | POA: Insufficient documentation

## 2021-03-17 LAB — TYPE AND SCREEN
ABO/RH(D): O POS
Antibody Screen: NEGATIVE
Unit division: 0
Unit division: 0
Unit division: 0
Unit division: 0
Unit division: 0
Unit division: 0

## 2021-03-17 LAB — BPAM RBC
Blood Product Expiration Date: 202205162359
Blood Product Expiration Date: 202205212359
Blood Product Expiration Date: 202205312359
Blood Product Expiration Date: 202205312359
Blood Product Expiration Date: 202206012359
Blood Product Expiration Date: 202206042359
ISSUE DATE / TIME: 202205030736
ISSUE DATE / TIME: 202205031031
ISSUE DATE / TIME: 202205050204
ISSUE DATE / TIME: 202205050517
ISSUE DATE / TIME: 202205061159
Unit Type and Rh: 5100
Unit Type and Rh: 5100
Unit Type and Rh: 5100
Unit Type and Rh: 5100
Unit Type and Rh: 5100
Unit Type and Rh: 5100

## 2021-03-17 LAB — COMPREHENSIVE METABOLIC PANEL
ALT: 36 U/L (ref 0–44)
AST: 44 U/L — ABNORMAL HIGH (ref 15–41)
Albumin: 2 g/dL — ABNORMAL LOW (ref 3.5–5.0)
Alkaline Phosphatase: 78 U/L (ref 38–126)
Anion gap: 5 (ref 5–15)
BUN: 26 mg/dL — ABNORMAL HIGH (ref 8–23)
CO2: 21 mmol/L — ABNORMAL LOW (ref 22–32)
Calcium: 8.1 mg/dL — ABNORMAL LOW (ref 8.9–10.3)
Chloride: 115 mmol/L — ABNORMAL HIGH (ref 98–111)
Creatinine, Ser: 1.1 mg/dL (ref 0.61–1.24)
GFR, Estimated: >60 mL/min (ref >60)
Glucose, Bld: 135 mg/dL — ABNORMAL HIGH (ref 70–99)
Potassium: 4 mmol/L (ref 3.5–5.1)
Sodium: 141 mmol/L (ref 135–145)
Total Bilirubin: 0.6 mg/dL (ref 0.3–1.2)
Total Protein: 6.2 g/dL — ABNORMAL LOW (ref 6.5–8.1)

## 2021-03-17 LAB — GLUCOSE, CAPILLARY
Glucose-Capillary: 123 mg/dL — ABNORMAL HIGH (ref 70–99)
Glucose-Capillary: 124 mg/dL — ABNORMAL HIGH (ref 70–99)
Glucose-Capillary: 129 mg/dL — ABNORMAL HIGH (ref 70–99)
Glucose-Capillary: 130 mg/dL — ABNORMAL HIGH (ref 70–99)

## 2021-03-17 LAB — DIFFERENTIAL
Abs Immature Granulocytes: NONE SEEN 10*3/uL (ref 0.00–0.07)
Band Neutrophils: 9 %
Basophils Relative: 1 %
Blasts: NONE SEEN %
Eosinophils Relative: 0 %
Immature Granulocytes: NONE SEEN %
Lymphocytes Relative: 3 %
Metamyelocytes Relative: NONE SEEN %
Monocytes Relative: 10 %
Myelocytes: NONE SEEN %
Neutrophils Relative %: 77 %
Promyelocytes Relative: NONE SEEN %
RBC Morphology: NORMAL
WBC Morphology: NORMAL
nRBC: 16 /100 WBC — ABNORMAL HIGH

## 2021-03-17 LAB — CBC
HCT: 26.1 % — ABNORMAL LOW (ref 39.0–52.0)
Hemoglobin: 8.3 g/dL — ABNORMAL LOW (ref 13.0–17.0)
MCH: 28.9 pg (ref 26.0–34.0)
MCHC: 31.8 g/dL (ref 30.0–36.0)
MCV: 90.9 fL (ref 80.0–100.0)
Platelets: 312 10*3/uL (ref 150–400)
RBC: 2.87 MIL/uL — ABNORMAL LOW (ref 4.22–5.81)
RDW: 17.5 % — ABNORMAL HIGH (ref 11.5–15.5)
WBC: 11.4 10*3/uL — ABNORMAL HIGH (ref 4.0–10.5)

## 2021-03-17 LAB — PHOSPHORUS: Phosphorus: 3 mg/dL (ref 2.5–4.6)

## 2021-03-17 LAB — AEROBIC/ANAEROBIC CULTURE W GRAM STAIN (SURGICAL/DEEP WOUND)

## 2021-03-17 LAB — TRIGLYCERIDES: Triglycerides: 99 mg/dL (ref <150)

## 2021-03-17 LAB — MAGNESIUM: Magnesium: 2 mg/dL (ref 1.7–2.4)

## 2021-03-17 LAB — PREALBUMIN: Prealbumin: 6.6 mg/dL — ABNORMAL LOW (ref 18–38)

## 2021-03-17 MED ORDER — SUCRALFATE 1 GM/10ML PO SUSP
1.0000 g | Freq: Three times a day (TID) | ORAL | Status: DC
  Administered 2021-03-17 – 2021-04-01 (×53): 1 g via ORAL
  Filled 2021-03-17 (×53): qty 10

## 2021-03-17 MED ORDER — TRAVASOL 10 % IV SOLN
INTRAVENOUS | Status: AC
  Filled 2021-03-17: qty 1123.2

## 2021-03-17 MED ORDER — FUROSEMIDE 10 MG/ML IJ SOLN
20.0000 mg | Freq: Once | INTRAMUSCULAR | Status: AC
  Administered 2021-03-17: 20 mg via INTRAVENOUS
  Filled 2021-03-17: qty 2

## 2021-03-17 MED ORDER — ACETAMINOPHEN 500 MG PO TABS
1000.0000 mg | ORAL_TABLET | Freq: Four times a day (QID) | ORAL | Status: DC
  Administered 2021-03-17 – 2021-04-11 (×87): 1000 mg via ORAL
  Filled 2021-03-17 (×90): qty 2

## 2021-03-17 MED ORDER — HYDROMORPHONE HCL 1 MG/ML IJ SOLN
0.5000 mg | INTRAMUSCULAR | Status: DC | PRN
  Administered 2021-03-17 – 2021-03-19 (×15): 1 mg via INTRAVENOUS
  Filled 2021-03-17 (×15): qty 1

## 2021-03-17 MED ORDER — METOPROLOL TARTRATE 5 MG/5ML IV SOLN: 5.0000 mg | Freq: Four times a day (QID) | INTRAVENOUS | Status: DC | PRN

## 2021-03-17 NOTE — Progress Notes (Signed)
Pt declined cpt/vest therapy tonight.

## 2021-03-17 NOTE — Progress Notes (Signed)
New wound RLQ- small dehsiced area 1.1cmx0.1cm, milky/purulent drainage. Area cleaned with NS, gauze and tape applied. MD notified. RN will continue to monitor.

## 2021-03-17 NOTE — Progress Notes (Signed)
PHARMACY - TOTAL PARENTERAL NUTRITION CONSULT NOTE   Indication: Prolonged ileus  Patient Measurements: Weight: 81.1 kg (178 lb 12.7 oz)   Body mass index is 25.65 kg/m.   Assessment: 70 yo male with rectal cancer s/p xi robotic assisted lower anterior resection with end colostomy on 4/27 now with prolonged ileus to start TPN on 5/2  Glucose / Insulin: CBGs continue to be at goal < 150 Electrolytes: Na much improved - 141.  K 4 stable. Mag/Phos/Corrected Ca WNL. Cl improved to 115, CO2 slightly low 21. Renal: SCr 1.12 improved, BUN 26 improved Hepatic: AST/ALT 44/36 improved, tbili 0.7, alkphos 78 Albumin/prealbumin: alb 2, prealb <6.6 (5/9) Intake / Output; MIVF: I/O last 24hr net -1L. (stool output 1816mL, drain output 151 ml and UOP of 3700 mL after lasix x 1). D5W at 50 ml/hr per Md GI Imaging: GI Surgeries / Procedures:  4/27:  xi robotic assisted lower anterior resection with end colostomy on 4/27 5/6: NGT out and unable to be replaced thus far  Central access: 5/2 TPN start date: 5/2  Nutritional Goals (per RD recommendation on 5/3): 2200-2400 kcal/day, protein 90-115 g/day, and >2L/day  Goal TPN rate is 90 mL/hr (provides 114 g of protein and 2199 kcals per day)  Current Nutrition:  NPO and TPN  Plan:   Continue TPN at goal 23mL/hr at 1800  Electrolytes in TPN:   Na add back 30 mEq/L  K 59mEq/L  Ca 17mEq/L  Mg 44mEq/L  Phos 38mmol/L  Cl:Ac max Ac  Add standard MVI and trace elements to TPN  Continue Sensitive SSI q6   D5W at 50 ml/hr per Md  Monitor TPN labs on Mon/Thurs; BMET in AM   Peggyann Juba, PharmD, [place]: [phone] 03/17/2021 7:02 AM

## 2021-03-17 NOTE — Anesthesia Postprocedure Evaluation (Signed)
Anesthesia Post Note  Patient: Tremel Setters  Procedure(s) Performed: XI ROBOTIC ASSISTED LOWER ANTERIOR RESECTION WITH MOBILIZATION OF THE SPLENIC FLEXURE OF THE COLON AND END COLOSTOMY (N/A Abdomen) OSTOMY (N/A )     Patient location during evaluation: PACU Anesthesia Type: General Level of consciousness: sedated and patient cooperative Pain management: pain level controlled Vital Signs Assessment: post-procedure vital signs reviewed and stable Respiratory status: spontaneous breathing Cardiovascular status: stable Anesthetic complications: no   No complications documented.  Last Vitals:  Vitals:   03/16/21 2000 03/17/21 0000  BP: (!) 141/53 (!) 126/43  Pulse:    Resp: (!) 33 (!) 39  Temp: 37.1 C 37.5 C  SpO2:      Last Pain:  Vitals:   03/17/21 0555  TempSrc:   PainSc: [place]

## 2021-03-17 NOTE — Progress Notes (Signed)
Rectal carcinoma (HCC)  Subjective: No major complaints this AM.  HGB stable.  Tolerating sips of clears  Objective: Vital signs in last 24 hours: Temp:  [98.4 F (36.9 C)-99.5 F (37.5 C)] 98.4 F (36.9 C) (05/09 0720) Pulse Rate:  [95-98] 96 (05/08 1300) Resp:  [21-39] 39 (05/09 0000) BP: (118-163)/(43-109) 126/43 (05/09 0000) SpO2:  [97 %-99 %] 99 % (05/08 1600) Weight:  [81.1 kg] 81.1 kg (05/09 0500) Last BM Date: 03/16/21  Intake/Output from previous day: 05/08 0701 - 05/09 0700 In: 5552.1 [P.O.:1000; I.V.:4398.6; IV Piggyback:148.5] Out: 8416 [Urine:3350; Drains:123; SAYTK:1601] Intake/Output this shift: No intake/output data recorded.  General appearance: alert and cooperative GI: normal findings: soft, TTP diffusely, no peritonitis Incision/Wound: clean, dry, intact JP: purulent Ostomy: bilious stool in bag, beefy red LLQ drain: purulent output  Lab Results:  Results for orders placed or performed during the hospital encounter of 03/05/21 (from the past 24 hour(s))  Glucose, capillary     Status: Abnormal   Collection Time: 03/16/21 11:41 AM  Result Value Ref Range   Glucose-Capillary 114 (H) 70 - 99 mg/dL  Glucose, capillary     Status: Abnormal   Collection Time: 03/16/21  6:32 PM  Result Value Ref Range   Glucose-Capillary 118 (H) 70 - 99 mg/dL   Comment 1 Notify RN    Comment 2 Document in Chart   Glucose, capillary     Status: Abnormal   Collection Time: 03/16/21 11:56 PM  Result Value Ref Range   Glucose-Capillary 119 (H) 70 - 99 mg/dL   Comment 1 Notify RN    Comment 2 Document in Chart   Prealbumin     Status: Abnormal   Collection Time: 03/17/21 12:18 AM  Result Value Ref Range   Prealbumin 6.6 (L) 18 - 38 mg/dL  Comprehensive metabolic panel     Status: Abnormal   Collection Time: 03/17/21 12:18 AM  Result Value Ref Range   Sodium 141 135 - 145 mmol/L   Potassium 4.0 3.5 - 5.1 mmol/L   Chloride 115 (H) 98 - 111 mmol/L   CO2 21 (L) 22 - 32  mmol/L   Glucose, Bld 135 (H) 70 - 99 mg/dL   BUN 26 (H) 8 - 23 mg/dL   Creatinine, Ser 1.10 0.61 - 1.24 mg/dL   Calcium 8.1 (L) 8.9 - 10.3 mg/dL   Total Protein 6.2 (L) 6.5 - 8.1 g/dL   Albumin 2.0 (L) 3.5 - 5.0 g/dL   AST 44 (H) 15 - 41 U/L   ALT 36 0 - 44 U/L   Alkaline Phosphatase 78 38 - 126 U/L   Total Bilirubin 0.6 0.3 - 1.2 mg/dL   GFR, Estimated >60 >60 mL/min   Anion gap 5 5 - 15  Magnesium     Status: None   Collection Time: 03/17/21 12:18 AM  Result Value Ref Range   Magnesium 2.0 1.7 - 2.4 mg/dL  Phosphorus     Status: None   Collection Time: 03/17/21 12:18 AM  Result Value Ref Range   Phosphorus 3.0 2.5 - 4.6 mg/dL  CBC     Status: Abnormal   Collection Time: 03/17/21 12:18 AM  Result Value Ref Range   WBC 11.4 (H) 4.0 - 10.5 K/uL   RBC 2.87 (L) 4.22 - 5.81 MIL/uL   Hemoglobin 8.3 (L) 13.0 - 17.0 g/dL   HCT 26.1 (L) 39.0 - 52.0 %   MCV 90.9 80.0 - 100.0 fL   MCH 28.9 26.0 - 34.0  pg   MCHC 31.8 30.0 - 36.0 g/dL   RDW 17.5 (H) 11.5 - 15.5 %   Platelets 312 150 - 400 K/uL  Triglycerides     Status: None   Collection Time: 03/17/21 12:18 AM  Result Value Ref Range   Triglycerides 99 <150 mg/dL  Differential     Status: Abnormal   Collection Time: 03/17/21 12:18 AM  Result Value Ref Range   Neutrophils Relative % 77 %   Band Neutrophils 9 %   Lymphocytes Relative 3 %   Monocytes Relative 10 %   Eosinophils Relative 0 %   Basophils Relative 1 %   WBC Morphology NORMAL    RBC Morphology NORMAL    nRBC 16 (H) 0 /100 WBC   Metamyelocytes Relative NONE SEEN %   Myelocytes NONE SEEN %   Promyelocytes Relative NONE SEEN %   Blasts NONE SEEN %   Immature Granulocytes NONE SEEN %   Abs Immature Granulocytes NONE SEEN 0.00 - 0.07 K/uL  Glucose, capillary     Status: Abnormal   Collection Time: 03/17/21  5:44 AM  Result Value Ref Range   Glucose-Capillary 123 (H) 70 - 99 mg/dL   Comment 1 Notify RN    Comment 2 Document in Chart       Studies/Results Radiology     MEDS, Scheduled . bacitracin   Topical BID  . chlorhexidine  15 mL Mouth Rinse BID  . Chlorhexidine Gluconate Cloth  6 each Topical Daily  . insulin aspart  0-9 Units Subcutaneous Q6H  . lip balm  1 application Topical BID  . mouth rinse  15 mL Mouth Rinse q12n4p  . melatonin  3 mg Oral QHS  . pantoprazole  40 mg Intravenous Q12H  . sodium chloride flush  10-40 mL Intracatheter Q12H  . sodium chloride flush  5 mL Intracatheter Q8H     Assessment: Rectal carcinoma (HCC) Post op abscess: IR drain in LLQ, completed course of Zosyn Hemorrhagic esophagitis   Plan: Cont IVF's, TPN Acute blood loss anemia: stable, has had IV Fe -20mg  IV lasix today for diuresis, continue to monitor Cr ID: [id number] coming down, will follow and repeat CT abd if it trends back up RT for pulm toilet PT for ambulation Try clears today Transfer to floor Cont Carafate, and IV PPI   LOS: 12 days    Rosario Adie, MD  [facility], [place]    03/17/2021 8:18 AM

## 2021-03-17 NOTE — Progress Notes (Signed)
  Radiation Oncology         ([phone]) [phone] ________________________________  Name: Shane [name] MRN: [id number]  Date of Service: 03/17/2021  DOB: 12-13-1950  Post Treatment Telephone Note  Diagnosis:  At least Stage IIB, cT4aN0M0, adenocarcinoma of the rectum   Interval Since Last Radiation:  11 weeks   11/27/2020 through 01/03/2021 Site Technique Total Dose (Gy) Dose per Fx (Gy) Completed Fx Beam Energies  Rectum: Rectum 3D 45/45 1.8 25/25 10X, 15X  Rectum: Rectum_Bst          Narrative:  The patient was contacted today for routine follow-up. During treatment he did very well with radiotherapy and did not have significant desquamation. He is currently hospitalized with a postoperative abscess following his LAR and end colostomy on 03/05/21.   Impression/Plan: 1. At least Stage IIB, cT4aN0M0, adenocarcinoma of the rectum. I was unable to reach the patient by phone. He did not have voicemail set up. We will plan to follow his course expectantly as he continues to follow up with Dr. Learta Codding in medical oncology.      Carola Rhine, PAC

## 2021-03-17 NOTE — Progress Notes (Signed)
Referring Physician(s): Gross,S  Supervising Physician: Sandi Mariscal  Patient Status:  [facility] - In-pt  Chief Complaint:  Abdominal abscess  Subjective: Patient resting quietly in bed, denies worsening abdominal pain, nausea, vomiting   Allergies: Nsaids  Medications: Prior to Admission medications   Medication Sig Start Date End Date Taking? Authorizing Provider  Diclofenac Sodium 2 % SOLN Place 2 g onto the skin 2 (two) times daily. Patient taking differently: Place 2 g onto the skin daily as needed (pain). 06/30/19  Yes Lyndal Pulley, DO  docusate sodium (COLACE) 100 MG capsule Take 100 mg by mouth daily as needed for mild constipation.    Yes [provider]  HYDROcodone-acetaminophen (NORCO/VICODIN) 5-325 MG tablet Take 1 tablet by mouth every 6 (six) hours as needed for moderate pain. 02/12/21  Yes Owens Shark, NP  hydrocortisone (ANUSOL-HC) 2.5 % rectal cream Place 1 application rectally 2 (two) times daily. Patient taking differently: Place 1 application rectally daily as needed for hemorrhoids or anal itching. 01/28/21  Yes Marrian Salvage, FNP  lidocaine-prilocaine (EMLA) cream Apply 1 application topically as needed. Patient taking differently: Apply 1 application topically as needed (prior to port use). 10/21/20  Yes Ladell Pier, MD  pravastatin (PRAVACHOL) 20 MG tablet TAKE 1 TABLET(20 MG) BY MOUTH DAILY Patient taking differently: Take 20 mg by mouth daily. 11/12/20  Yes Marrian Salvage, FNP  capecitabine (XELODA) 500 MG tablet TAKE 2000 MG (4 TABLETS) EVERY MORNING AND 1500 MG (3 TABLETS) EVERY EVENING FOR A TOTAL DAILY DOSE OF 3500 MG ON RADIATION DAYS ONLY. Patient not taking: No sig reported 11/04/20 11/04/21  Ladell Pier, MD  diphenoxylate-atropine (LOMOTIL) 2.5-0.025 MG tablet Take 1-2 tablets by mouth 3 (three) times daily as needed for diarrhea or loose stools (Maxiumum of 6/day). Patient not taking: No sig reported 12/25/20    Ladell Pier, MD  ondansetron (ZOFRAN) 8 MG tablet Take 1 tablet (8 mg total) by mouth every 8 (eight) hours as needed for nausea or vomiting. Start 72 hours after each IV chemotherapy treatment 07/30/20   Ladell Pier, MD  prochlorperazine (COMPAZINE) 10 MG tablet Take 1 tablet (10 mg total) by mouth every 6 (six) hours as needed for nausea. 07/30/20   Ladell Pier, MD     Vital Signs: BP (!) 153/51 (BP Location: Left Arm)   Pulse 80   Temp 98.4 F (36.9 C) (Oral)   Resp 20   Wt 178 lb 12.7 oz (81.1 kg)   SpO2 99%   BMI 25.65 kg/m   Physical Exam awake, alert.  Left upper quadrant drain intact, insertion site okay, not significantly tender, output 110 cc of turbid tan-colored fluid, drain irrigated without difficulty  Imaging: No results found.  Labs:  CBC: Recent Labs    03/14/21 0919 03/14/21 1800 03/15/21 0133 03/16/21 0246 03/17/21 0018  WBC 18.2*  --  11.1* 14.2* 11.4*  HGB 6.7* 8.0* 8.2* 8.6* 8.3*  HCT 21.0* 24.8* 25.7* 27.4* 26.1*  PLT 178  --  191 271 312    COAGS: No results for input(s): INR, APTT in the last 8760 hours.  BMP: Recent Labs    07/04/20 0845 07/15/20 0048 08/13/20 0906 03/14/21 0237 03/15/21 0133 03/16/21 0246 03/17/21 0018  NA 138 136   < > 154* 156* 148* 141  K 3.8 3.7   < > 3.8 3.8 4.0 4.0  CL 108 106   < > 125* 128* 120* 115*  CO2 25  21*   < > 21* 22 22 21*  GLUCOSE 105* 100*   < > 168* 132* 134* 135*  BUN 12 21   < > 46* 36* 27* 26*  CALCIUM 9.9 9.2   < > 8.0* 8.3* 8.2* 8.1*  CREATININE 0.96 0.94   < > 1.45* 1.27* 1.28* 1.10  GFRNONAA >60 >60   < > 52* >60 60* >60  GFRAA >60 >60  --   --   --   --   --    < > = values in this interval not displayed.    LIVER FUNCTION TESTS: Recent Labs    03/11/21 0231 03/13/21 0517 03/15/21 0133 03/17/21 0018  BILITOT 1.2 0.7 0.7 0.6  AST 30 27 53* 44*  ALT 22 19 40 36  ALKPHOS 46 71 84 78  PROT 5.1* 5.4* 5.8* 6.2*  ALBUMIN 2.2* 2.1* 2.0* 2.0*    Assessment and  Plan: Patient with history of rectal cancer, status post LAR with end colostomy; now with postop left upper quadrant abd abscess, s/p drain placement on 5/3; afebrile, WBC 11.4 down from 14.2, hemoglobin 8.3 down from 8.6, creatinine 1.10, drain fluid cultures with Enterococcus; cont  current treatment/drain irrigation/lab monitoring; once drain output minimal or if WBC increases/clinical status worsens obtain follow-up CT; will also need drain injection prior to removal   Electronically Signed: D. Rowe Robert, PA-C 03/17/2021, 10:37 AM   I spent a total of 15 minutes at the the patient's bedside AND on the patient's hospital floor or unit, greater than 50% of which was counseling/coordinating care for left upper abdominal abscess drain    [name], male   DOB: July 18, 1951, 70 y.o.   MRN: [id number]

## 2021-03-17 NOTE — Progress Notes (Signed)
Physical Therapy Treatment Patient Details Name: Shane [name] MRN: [id number] DOB: 1951/04/23 Today's Date: 03/17/2021    History of Present Illness The patient is a 70 year old male who presents with colorectal cancer, in  ED 03/05/21 with complaints of hemorrhoids and rectal bleeding, anemia. CT showed a large rectal mass. S/P ROBOTIC ASSISTED LOWER ANTERIOR RESECTION WITH END COLOSTOMY  MOBILIZATION OF THE SPLENIC FLEXURE  on 03/05/21, post op tachycardia and hypotension.    PT Comments    POD # 12  Daughter in room during session. First performed B LE MMT and some LE TE's of AP and HS. Assisted to EOB.  General bed mobility comments: performed partial "Log Roll" due to recent ABD surgery and c/o ABD pain required + 2 asisst.  Once upright, pt was able to static sit EOB x 5 min with slight dizziness.  vitals WNL. Attempted to stand.  General transfer comment: very weak...Marland KitchenMarland KitchenMarland Kitchenrequired + 2 side by side assist with VC's to "lock knees" to achieve partial upright stance.  Pt only able to take a few partial steps to recliner then uncontrolled desend to sit.  Try EVA walker next session.  profoundly weak.  Positioned in recliner with multiple pillows.    Follow Up Recommendations  SNF     Equipment Recommendations       Recommendations for Other Services       Precautions / Restrictions Precautions Precautions: Fall Precaution Comments: colostomy, scrotal edema, colostomy, B drains Restrictions Weight Bearing Restrictions: No    Mobility  Bed Mobility Overal bed mobility: Needs Assistance Bed Mobility: Rolling;Sidelying to Sit Rolling: Mod assist Sidelying to sit: Max assist;+2 for physical assistance;+2 for safety/equipment       General bed mobility comments: performed partial "Log Roll" due to recent ABD surgery and c/o ABD pain required + 2 asisst.  Once upright, pt was able to static sit EOB x 5 min with slight dizziness.  vitals WNL.    Transfers Overall transfer level:  Needs assistance Equipment used: Rolling walker (2 wheeled) Transfers: Sit to/from [facility] Sit to Stand: Max assist;+2 physical assistance;+2 safety/equipment Stand pivot transfers: Max assist;Total assist;+2 physical assistance;+2 safety/equipment       General transfer comment: very weak...Marland KitchenMarland KitchenMarland Kitchenrequired + 2 side by side assist with VC's to "lock knees" to achieve partial upright stance.  Pt only able to take a few partial steps to recliner then uncontrolled desend to sit.  Try EVA walker next session.  profoundly weak.  Ambulation/Gait             General Gait Details: not able due to weakness   Stairs             Wheelchair Mobility    Modified Rankin (Stroke Patients Only)       Balance                                            Cognition Arousal/Alertness: Awake/alert Behavior During Therapy: WFL for tasks assessed/performed Overall Cognitive Status: Within Functional Limits for tasks assessed                                 General Comments: AxO x 3 slightly groggy (meds) following all directions.  Flat affect      Exercises      General Comments  Pertinent Vitals/Pain Pain Assessment: Faces Faces Pain Scale: Hurts even more Pain Location: abdomen mid lower Pain Descriptors / Indicators: Discomfort Pain Intervention(s): Monitored during session    Home Living                      Prior Function            PT Goals (current goals can now be found in the care plan section) Progress towards PT goals: Progressing toward goals    Frequency    Min 3X/week      PT Plan Current plan remains appropriate    Co-evaluation              AM-PAC PT "6 Clicks" Mobility   Outcome Measure    Help needed moving from lying on your back to sitting on the side of a flat bed without using bedrails?: Total Help needed moving to and from a bed to a chair (including a  wheelchair)?: Total Help needed standing up from a chair using your arms (e.g., wheelchair or bedside chair)?: Total Help needed to walk in hospital room?: Total Help needed climbing 3-5 steps with a railing? : Total 6 Click Score: 5    End of Session Equipment Utilized During Treatment: Gait belt Activity Tolerance: Treatment limited secondary to medical complications (Comment);Patient limited by fatigue Patient left: in chair;with call bell/phone within reach;with chair alarm set Nurse Communication: Mobility status PT Visit Diagnosis: Unsteadiness on feet (R26.81);Difficulty in walking, not elsewhere classified (R26.2);Pain;Other abnormalities of gait and mobility (R26.89)     Time: 1345-1415 PT Time Calculation (min) (ACUTE ONLY): 30 min  Charges:  $Therapeutic Activity: 23-37 mins                     Rica Koyanagi  PTA Acute  Rehabilitation Services Pager      [phone] Office      [phone]

## 2021-03-17 NOTE — Progress Notes (Signed)
Physical Therapy Treatment Patient Details Name: Shane [name] MRN: [id number] DOB: [birth year] Today's Date: 03/17/2021    History of Present Illness The patient is a 70 year old male who presents with colorectal cancer, in  ED 03/05/21 with complaints of hemorrhoids and rectal bleeding, anemia. CT showed a large rectal mass. S/P ROBOTIC ASSISTED LOWER ANTERIOR RESECTION WITH END COLOSTOMY  MOBILIZATION OF THE SPLENIC FLEXURE  on 03/05/21, post op tachycardia and hypotension.    PT Comments    POD # 12 pm session General transfer comment: RN sent a secure chat for assistance back to bed.  Pt required + 2 side by side assist and quick 1/4 pivot from recliner to bed.  Max c/o fatigue as pt was in chair approx one hour. Sit to sidelying: Max assist;Total assist;+2 for physical assistance;+2 for safety/equipment Pt will need ST Rehab at SNF.   Follow Up Recommendations  SNF     Equipment Recommendations       Recommendations for Other Services       Precautions / Restrictions Precautions Precautions: Fall Precaution Comments: colostomy, scrotal edema, colostomy, B drains Restrictions Weight Bearing Restrictions: No    Mobility  Bed Mobility Overal bed mobility: Needs Assistance Bed Mobility: Rolling;Sit to Sidelying Rolling: Max assist Sidelying to sit: Max assist;+2 for physical assistance;+2 for safety/equipment     Sit to sidelying: Max assist;Total assist;+2 for physical assistance;+2 for safety/equipment General bed mobility comments: assisted back to bed partial "log roll" to sidelying the rolling with B knees fale to supine.  Positioned to comfort.    Transfers Overall transfer level: Needs assistance Equipment used: None Transfers: Stand Pivot Transfers Sit to Stand: Max assist;+2 physical assistance;+2 safety/equipment Stand pivot transfers: Max assist;Total assist;+2 physical assistance;+2 safety/equipment       General transfer comment: RN sent a secure chat  for assistance back to bed.  Pt required + 2 side by side assist and quick 1/4 pivot from recliner to bed.  Max c/o fatigue as pt was in chair approx one hour.  Ambulation/Gait             General Gait Details: not able due to weakness   Stairs             Wheelchair Mobility    Modified Rankin (Stroke Patients Only)       Balance                                            Cognition Arousal/Alertness: Awake/alert Behavior During Therapy: WFL for tasks assessed/performed Overall Cognitive Status: Within Functional Limits for tasks assessed                                 General Comments: AxO x 3 slightly groggy (meds) following all directions.  Flat affect      Exercises      General Comments        Pertinent Vitals/Pain Pain Assessment: Faces Faces Pain Scale: Hurts even more Pain Location: abdomen mid lower Pain Descriptors / Indicators: Discomfort Pain Intervention(s): Monitored during session    Home Living                      Prior Function            PT Goals (current goals  can now be found in the care plan section) Progress towards PT goals: Progressing toward goals    Frequency    Min 3X/week      PT Plan Current plan remains appropriate    Co-evaluation              AM-PAC PT "6 Clicks" Mobility   Outcome Measure    Help needed moving from lying on your back to sitting on the side of a flat bed without using bedrails?: Total Help needed moving to and from a bed to a chair (including a wheelchair)?: Total Help needed standing up from a chair using your arms (e.g., wheelchair or bedside chair)?: Total Help needed to walk in hospital room?: Total Help needed climbing 3-5 steps with a railing? : Total 6 Click Score: 5    End of Session Equipment Utilized During Treatment: Gait belt Activity Tolerance: Treatment limited secondary to medical complications (Comment);Patient  limited by fatigue Patient left: in bed;with bed alarm set;with nursing/sitter in room;with call bell/phone within reach Nurse Communication: Mobility status PT Visit Diagnosis: Unsteadiness on feet (R26.81);Difficulty in walking, not elsewhere classified (R26.2);Pain;Other abnormalities of gait and mobility (R26.89)     Time: 9935-7017 PT Time Calculation (min) (ACUTE ONLY): 16 min  Charges:  $Therapeutic Activity: 23-37 mins                     Rica Koyanagi  PTA Acute  Rehabilitation Services Pager      [phone] Office      [phone]

## 2021-03-17 NOTE — TOC Progression Note (Signed)
Transition of Care [facility]) - Progression Note    Patient Details  Name: Blanton Kardell MRN: [id number] Date of Birth: 1950-11-30  Transition of [facility]) CM/SW Contact  Leeroy Cha, RN Phone Number: 03/17/2021, 9:05 AM  Clinical Narrative:    Rectal carcinoma ([place])  Subjective: No major complaints this AM.  HGB stable.  Tolerating sips of clears  Objective: Vital signs in last 24 hours: Temp:  [98.4 F (36.9 C)-99.5 F (37.5 C)] 98.4 F (36.9 C) (05/09 0720) Pulse Rate:  [95-98] 96 (05/08 1300) Resp:  [21-39] 39 (05/09 0000) BP: (118-163)/(43-109) 126/43 (05/09 0000) SpO2:  [97 %-99 %] 99 % (05/08 1600) Weight:  [81.1 kg] 81.1 kg (05/09 0500) Last BM Date: 03/16/21  Intake/Output from previous day: 05/08 0701 - 05/09 0700 In: 5552.1 [P.O.:1000; I.V.:4398.6; IV Piggyback:148.5] Out: 8127 [Urine:3350; Drains:123; NTZGY:1749] Intake/Output this shift: No intake/output data recorded.  General appearance: alert and cooperative GI: normal findings: soft, TTP diffusely, no peritonitis Incision/Wound: clean, dry, intact JP: purulent Ostomy: bilious stool in bag, beefy red LLQ drain: purulent output  PLAN: following for progression and toc needs not stable enough for snf Expected Discharge Plan: [place] Barriers to Discharge: Continued Medical Work up  Expected Discharge Plan and Services Expected Discharge Plan: [place]   Discharge Planning Services: CM Consult   Living arrangements for the past 2 months: Single Family Home                                       Social Determinants of Health (SDOH) Interventions    Readmission Risk Interventions No flowsheet data found.

## 2021-03-18 LAB — CBC
HCT: 24.1 % — ABNORMAL LOW (ref 39.0–52.0)
Hemoglobin: 7.5 g/dL — ABNORMAL LOW (ref 13.0–17.0)
MCH: 28.7 pg (ref 26.0–34.0)
MCHC: 31.1 g/dL (ref 30.0–36.0)
MCV: 92.3 fL (ref 80.0–100.0)
Platelets: 356 10*3/uL (ref 150–400)
RBC: 2.61 MIL/uL — ABNORMAL LOW (ref 4.22–5.81)
RDW: 16.7 % — ABNORMAL HIGH (ref 11.5–15.5)
WBC: 13.9 10*3/uL — ABNORMAL HIGH (ref 4.0–10.5)
nRBC: 1.4 % — ABNORMAL HIGH (ref 0.0–0.2)

## 2021-03-18 LAB — BASIC METABOLIC PANEL
Anion gap: 3 — ABNORMAL LOW (ref 5–15)
BUN: 24 mg/dL — ABNORMAL HIGH (ref 8–23)
CO2: 22 mmol/L (ref 22–32)
Calcium: 8.1 mg/dL — ABNORMAL LOW (ref 8.9–10.3)
Chloride: 114 mmol/L — ABNORMAL HIGH (ref 98–111)
Creatinine, Ser: 0.74 mg/dL (ref 0.61–1.24)
GFR, Estimated: >60 mL/min (ref >60)
Glucose, Bld: 124 mg/dL — ABNORMAL HIGH (ref 70–99)
Potassium: 4.4 mmol/L (ref 3.5–5.1)
Sodium: 139 mmol/L (ref 135–145)

## 2021-03-18 LAB — GLUCOSE, CAPILLARY
Glucose-Capillary: 124 mg/dL — ABNORMAL HIGH (ref 70–99)
Glucose-Capillary: 145 mg/dL — ABNORMAL HIGH (ref 70–99)
Glucose-Capillary: 123 mg/dL — ABNORMAL HIGH (ref 70–99)

## 2021-03-18 MED ORDER — OXYCODONE HCL 5 MG PO TABS
5.0000 mg | ORAL_TABLET | ORAL | Status: DC | PRN
Start: 2021-03-18 — End: 2021-04-11
  Administered 2021-03-19 – 2021-04-03 (×45): 10 mg via ORAL
  Administered 2021-04-04: 5 mg via ORAL
  Administered 2021-04-04 (×3): 10 mg via ORAL
  Administered 2021-04-05 (×2): 5 mg via ORAL
  Administered 2021-04-05 – 2021-04-09 (×16): 10 mg via ORAL
  Administered 2021-04-09: 5 mg via ORAL
  Administered 2021-04-09 – 2021-04-11 (×6): 10 mg via ORAL
  Filled 2021-03-18 (×76): qty 2

## 2021-03-18 MED ORDER — DEXTROSE 70 % IV SOLN
INTRAVENOUS | Status: AC
Start: 2021-03-18 — End: 2021-03-19
  Filled 2021-03-18: qty 1123.2

## 2021-03-18 MED ORDER — ENSURE SURGERY PO LIQD
237.0000 mL | Freq: Two times a day (BID) | ORAL | Status: DC
  Administered 2021-03-18 – 2021-03-25 (×4): 237 mL via ORAL
  Filled 2021-03-18 (×16): qty 237

## 2021-03-18 NOTE — Progress Notes (Unsigned)
Error

## 2021-03-18 NOTE — Progress Notes (Signed)
Nutrition Follow-up  DOCUMENTATION CODES:   Non-severe (moderate) malnutrition in context of chronic illness  INTERVENTION:  - continue Ensure Surgery BID, each supplement provides 330 kcal and 18 grams protein. - continue diet advancement as feasible. - TPN management per Pharmacist.    NUTRITION DIAGNOSIS:   Moderate Malnutrition related to chronic illness,cancer and cancer related treatments as evidenced by mild fat depletion,moderate fat depletion,mild muscle depletion,moderate muscle depletion,severe muscle depletion. -ongoing  GOAL:   Patient will meet greater than or equal to 90% of their needs -met with TPN  MONITOR:   PO intake,Supplement acceptance,Diet advancement,Labs,Weight trends,Other (Comment) (TPN regimen)  ASSESSMENT:   70 yo male with a PMH of arthritis and recent dx (08/2020) of rectal cancer (current treatment of [facility]) who presents with rectal bleeding. Pt with AKI and ileus.  Significant Events: 4/27- anterior resection with end colostomy; diet advanced to CLD 4/28- NPO 4/30- advanced to CLD then back to NPO 5/1- NGT placed 5/2- double lumen PICC placed in R basilic; TPN initiation   5/9- diet advanced to CLD   Patient sleeping soundly at the time of visit with no family or visitors present. No clear liquids in the room. No intakes documented following diet advancement yesterday. Diet was advanced to FLD today at [place].   Weight has been fluctuating almost daily since admission. Current weight is -5 lb compared to admission weight. Non-pitting edema to BUE and RLE and mild pitting edema to LLE, facial area, and perineal area documented in the edema section of flow sheet.   He is receiving custom TPN at goal rate of 90 ml/hr which is providing 2199 kcal and 114 grams protein/24 hrs.    Labs reviewed; CBG: 124 mg/dl, Cl: 114 mmol/l, BUN: 24 mg/dl, Ca: 8.1 mg/dl. Medications reviewed; 20 mg IV lasix x1 dose 5/9, 3 mg melatonin/night, 40 mg IV protonix  BID, 1 g carafate TID.   Diet Order:   Diet Order            Diet full liquid Room service appropriate? Yes; Fluid consistency: Thin  Diet effective now                 EDUCATION NEEDS:   Education needs have been addressed  Skin:  Skin Assessment: Skin Integrity Issues: Skin Integrity Issues:: Incisions Incisions: Abdomen, closed  Last BM:  5/10 (150 ml via ostomy)  Height:   Ht Readings from Last 1 Encounters:  02/26/21 5' 10"  (1.778 m)    Weight:   Wt Readings from Last 1 Encounters:  03/17/21 81.1 kg     Estimated Nutritional Needs:  Kcal:  2200-2400 Protein:  90-115 grams Fluid:  >2 L     Shane Matin, MS, RD, LDN, CNSC Inpatient Clinical Dietitian RD pager # available in [place]  After hours/weekend pager # available in [facility]

## 2021-03-18 NOTE — Progress Notes (Addendum)
Rectal carcinoma (HCC)  Subjective: No major complaints this AM.  HGB down a bit today.  Tolerating clears  Objective: Vital signs in last 24 hours: Temp:  [97.9 F (36.6 C)-99 F (37.2 C)] 98.7 F (37.1 C) (05/10 0401) Pulse Rate:  [91-104] 97 (05/10 0401) Resp:  [17-33] 19 (05/10 0401) BP: (106-149)/(48-78) 132/52 (05/10 0401) SpO2:  [96 %-100 %] 99 % (05/10 0401) Last BM Date: 03/17/21  Intake/Output from previous day: 05/09 0701 - 05/10 0700 In: 3165.6 [P.O.:240; I.V.:2925.6] Out: 9983 [Urine:3410; Drains:302; Stool:480] Intake/Output this shift: No intake/output data recorded.  General appearance: alert and cooperative GI: normal findings: soft, TTP diffusely, no peritonitis Incision/Wound: clean, dry, intact JP: purulent Ostomy: bilious stool in bag, beefy red LLQ drain: purulent output  Lab Results:  Results for orders placed or performed during the hospital encounter of 03/05/21 (from the past 24 hour(s))  Glucose, capillary     Status: Abnormal   Collection Time: 03/17/21 12:06 PM  Result Value Ref Range   Glucose-Capillary 129 (H) 70 - 99 mg/dL   Comment 1 Notify RN    Comment 2 Document in Chart   Glucose, capillary     Status: Abnormal   Collection Time: 03/17/21  5:43 PM  Result Value Ref Range   Glucose-Capillary 130 (H) 70 - 99 mg/dL  Glucose, capillary     Status: Abnormal   Collection Time: 03/17/21 11:52 PM  Result Value Ref Range   Glucose-Capillary 124 (H) 70 - 99 mg/dL  CBC     Status: Abnormal   Collection Time: 03/18/21  4:00 AM  Result Value Ref Range   WBC 13.9 (H) 4.0 - 10.5 K/uL   RBC 2.61 (L) 4.22 - 5.81 MIL/uL   Hemoglobin 7.5 (L) 13.0 - 17.0 g/dL   HCT 24.1 (L) 39.0 - 52.0 %   MCV 92.3 80.0 - 100.0 fL   MCH 28.7 26.0 - 34.0 pg   MCHC 31.1 30.0 - 36.0 g/dL   RDW 16.7 (H) 11.5 - 15.5 %   Platelets 356 150 - 400 K/uL   nRBC 1.4 (H) 0.0 - 0.2 %  Basic metabolic panel     Status: Abnormal   Collection Time: 03/18/21  4:00 AM   Result Value Ref Range   Sodium 139 135 - 145 mmol/L   Potassium 4.4 3.5 - 5.1 mmol/L   Chloride 114 (H) 98 - 111 mmol/L   CO2 22 22 - 32 mmol/L   Glucose, Bld 124 (H) 70 - 99 mg/dL   BUN 24 (H) 8 - 23 mg/dL   Creatinine, Ser 0.74 0.61 - 1.24 mg/dL   Calcium 8.1 (L) 8.9 - 10.3 mg/dL   GFR, Estimated >60 >60 mL/min   Anion gap 3 (L) 5 - 15  Glucose, capillary     Status: Abnormal   Collection Time: 03/18/21  6:27 AM  Result Value Ref Range   Glucose-Capillary 124 (H) 70 - 99 mg/dL     Studies/Results Radiology     MEDS, Scheduled . acetaminophen  1,000 mg Oral Q6H  . bacitracin   Topical BID  . chlorhexidine  15 mL Mouth Rinse BID  . Chlorhexidine Gluconate Cloth  6 each Topical Daily  . insulin aspart  0-9 Units Subcutaneous Q6H  . lip balm  1 application Topical BID  . mouth rinse  15 mL Mouth Rinse q12n4p  . melatonin  3 mg Oral QHS  . pantoprazole  40 mg Intravenous Q12H  . sodium chloride flush  10-40 mL Intracatheter Q12H  . sodium chloride flush  5 mL Intracatheter Q8H  . sucralfate  1 g Oral TID WC & HS     Assessment: Rectal carcinoma (HCC)  POD 12 LAR and diverting ostomy Post op abscess: IR drain in LLQ, completed course of Zosyn and Eraxis Hemorrhagic esophagitis  Plan: Cont TPN Acute blood loss anemia: Hgb down today, has had IV Fe, repeat CBC in AM ID: wbc up and down, will follow and repeat CT abd if it trends back up RT for pulm toilet PT for ambulation Try fulls today PO pain meds Cont Carafate, and IV PPI for esophagitis   d/c foley   LOS: 13 days    Rosario Adie, MD  [facility], [place]    03/18/2021 8:26 AM

## 2021-03-18 NOTE — Progress Notes (Signed)
Referring Physician(s): Gross,S  Supervising Physician: Jacqulynn Cadet  Patient Status:  [facility] - In-pt  Chief Complaint:  Abdominal abscess  Subjective: Patient without new complaints; denies worsening abdominal pain, nausea, vomiting; tolerating diet okay   Allergies: Nsaids  Medications: Prior to Admission medications   Medication Sig Start Date End Date Taking? Authorizing Provider  Diclofenac Sodium 2 % SOLN Place 2 g onto the skin 2 (two) times daily. Patient taking differently: Place 2 g onto the skin daily as needed (pain). 06/30/19  Yes Lyndal Pulley, DO  docusate sodium (COLACE) 100 MG capsule Take 100 mg by mouth daily as needed for mild constipation.    Yes [provider]  HYDROcodone-acetaminophen (NORCO/VICODIN) 5-325 MG tablet Take 1 tablet by mouth every 6 (six) hours as needed for moderate pain. 02/12/21  Yes Owens Shark, NP  hydrocortisone (ANUSOL-HC) 2.5 % rectal cream Place 1 application rectally 2 (two) times daily. Patient taking differently: Place 1 application rectally daily as needed for hemorrhoids or anal itching. 01/28/21  Yes Marrian Salvage, FNP  lidocaine-prilocaine (EMLA) cream Apply 1 application topically as needed. Patient taking differently: Apply 1 application topically as needed (prior to port use). 10/21/20  Yes Ladell Pier, MD  pravastatin (PRAVACHOL) 20 MG tablet TAKE 1 TABLET(20 MG) BY MOUTH DAILY Patient taking differently: Take 20 mg by mouth daily. 11/12/20  Yes Marrian Salvage, FNP  capecitabine (XELODA) 500 MG tablet TAKE 2000 MG (4 TABLETS) EVERY MORNING AND 1500 MG (3 TABLETS) EVERY EVENING FOR A TOTAL DAILY DOSE OF 3500 MG ON RADIATION DAYS ONLY. Patient not taking: No sig reported 11/04/20 11/04/21  Ladell Pier, MD  diphenoxylate-atropine (LOMOTIL) 2.5-0.025 MG tablet Take 1-2 tablets by mouth 3 (three) times daily as needed for diarrhea or loose stools (Maxiumum of 6/day). Patient not taking:  No sig reported 12/25/20   Ladell Pier, MD  ondansetron (ZOFRAN) 8 MG tablet Take 1 tablet (8 mg total) by mouth every 8 (eight) hours as needed for nausea or vomiting. Start 72 hours after each IV chemotherapy treatment 07/30/20   Ladell Pier, MD  prochlorperazine (COMPAZINE) 10 MG tablet Take 1 tablet (10 mg total) by mouth every 6 (six) hours as needed for nausea. 07/30/20   Ladell Pier, MD     Vital Signs: BP (!) 126/53 (BP Location: Left Arm)   Pulse 88   Temp 98.5 F (36.9 C) (Oral)   Resp 20   Wt 178 lb 12.7 oz (81.1 kg)   SpO2 100%   BMI 25.65 kg/m   Physical Exam awake, alert;  Left upper quadrant drain intact, insertion site okay, not significantly tender, output  220 cc of turbid tan-colored fluid Imaging: No results found.  Labs:  CBC: Recent Labs    03/15/21 0133 03/16/21 0246 03/17/21 0018 03/18/21 0400  WBC 11.1* 14.2* 11.4* 13.9*  HGB 8.2* 8.6* 8.3* 7.5*  HCT 25.7* 27.4* 26.1* 24.1*  PLT 191 271 312 356    COAGS: No results for input(s): INR, APTT in the last 8760 hours.  BMP: Recent Labs    07/04/20 0845 07/15/20 0048 08/13/20 0906 03/15/21 0133 03/16/21 0246 03/17/21 0018 03/18/21 0400  NA 138 136   < > 156* 148* 141 139  K 3.8 3.7   < > 3.8 4.0 4.0 4.4  CL 108 106   < > 128* 120* 115* 114*  CO2 25 21*   < > 22 22 21* 22  GLUCOSE 105*  100*   < > 132* 134* 135* 124*  BUN 12 21   < > 36* 27* 26* 24*  CALCIUM 9.9 9.2   < > 8.3* 8.2* 8.1* 8.1*  CREATININE 0.96 0.94   < > 1.27* 1.28* 1.10 0.74  GFRNONAA >60 >60   < > >60 60* >60 >60  GFRAA >60 >60  --   --   --   --   --    < > = values in this interval not displayed.    LIVER FUNCTION TESTS: Recent Labs    03/11/21 0231 03/13/21 0517 03/15/21 0133 03/17/21 0018  BILITOT 1.2 0.7 0.7 0.6  AST 30 27 53* 44*  ALT 22 19 40 36  ALKPHOS 46 71 84 78  PROT 5.1* 5.4* 5.8* 6.2*  ALBUMIN 2.2* 2.1* 2.0* 2.0*    Assessment and Plan: Patient with history of rectal cancer, status  post LAR with end colostomy; now with postop left upper quadrant abd abscess, s/p drain placement on 5/3; afebrile, WBC 13.9 up from 11.4, hemoglobin 7.5 down from 8.3, creatinine normal ;drain fluid cultures with Enterococcus; cont  current treatment/drain irrigation/lab monitoring; once drain output minimal or if WBC cont to increase/clinical status worsens obtain follow-up CT; will also need drain injection prior to removal   Electronically Signed: D. Rowe Robert, PA-C 03/18/2021, 5:08 PM   I spent a total of 15 minutes at the the patient's bedside AND on the patient's hospital floor or unit, greater than 50% of which was counseling/coordinating care for left upper abdominal abscess drain    Patient ID: Ziere Docken, male   DOB: 1951/05/01, 70 y.o.   MRN: [id number]

## 2021-03-18 NOTE — Consult Note (Signed)
[place] Nurse ostomy follow up Stoma type/location: LUQ, colstomy Stomal assessment/size: 1 13/4" budded, pink, os central  Peristomal assessment: NA Treatment options for stomal/peristomal skin: using 2" skin barrier Output liquid brown Ostomy pouching: 1pc flat used currently; was placed by bedside nursing staff last evening.   Education provided:  Patient is still not truly ready for teaching, daughter is a CNA and reports comfort with change.  She is with her dad most of the time at the hospital.  Enrolled patient in [place] Start Discharge program: Yes, previously  Placed 1pc flat pouches and 2" skin barrier in the room, will plan for pouch change with daughter later in the week.   [place] Nurse will follow along with you for continued support with ostomy teaching and care [place] MSN, RN, [place], [place], [place]

## 2021-03-18 NOTE — Progress Notes (Signed)
PHARMACY - TOTAL PARENTERAL NUTRITION CONSULT NOTE   Indication: Prolonged ileus  Patient Measurements: Weight: 81.1 kg (178 lb 12.7 oz)   Body mass index is 25.65 kg/m.   Assessment: 70 yo male with rectal cancer s/p xi robotic assisted lower anterior resection with end colostomy on 4/27 now with prolonged ileus to start TPN on 5/2.  Glucose / Insulin: on sSSI q6h--> d/c on 5/10 - CBGs continue to be at goal < 150 Electrolytes:  Cl improved to 114, CO2 wnl; other lytes wnl Renal: SCr 1.12 improved, BUN 26 improved Hepatic: AST/ALT 44/36 improved, tbili 0.7, alkphos 78 --> per labs on 5/9 Albumin/prealbumin: alb 2, prealb <6.6 (5/9) Intake / Output; MIVF: I/O last 24hr net -1L.  - stool output 448mL - drain output 302 ml  - UOP 3410 mL -  D5W at 50 ml/hr per Md GI Imaging: GI Surgeries / Procedures:  4/27:  xi robotic assisted lower anterior resection with end colostomy on 4/27 5/6: NGT out and unable to be replaced thus far  Central access: 5/2 TPN start date: 5/2  Nutritional Goals (per RD recommendation on 5/3): 2200-2400 kcal/day, protein 90-115 g/day, and >2L/day  Goal TPN rate is 90 mL/hr (provides 114 g of protein and 2199 kcals per day)  - 5/9: starting clears - 5/10: adv to fulls   Current Nutrition:  NPO and TPN  Plan:   Continue TPN at goal 56mL/hr at 1800  Electrolytes in TPN:   Continue Na at 30 mEq/L  Reduce K to 50 mEq/L  Ca 74mEq/L  Mg 74mEq/L  Phos 85mmol/L  Cl:Ac max Ac  Add standard MVI and trace elements to TPN  With good cbgs, will d/cSSI   D5W at 50 ml/hr per Md  Bmet, mag and phos on 5/11  Monitor TPN labs on Mon/Thurs  Dia Sitter, PharmD, BCPS 03/18/2021 7:34 AM

## 2021-03-18 NOTE — Care Management Important Message (Signed)
Important Message  Patient Details IM Letter given to the Patient. Name: Kito Cuffe MRN: [id number] Date of Birth: 1951-05-31   Medicare Important Message Given:  Yes     Kerin Salen 03/18/2021, 10:34 AM

## 2021-03-19 ENCOUNTER — Inpatient Hospital Stay (HOSPITAL_COMMUNITY): Payer: Medicare Other

## 2021-03-19 ENCOUNTER — Other Ambulatory Visit: Payer: Self-pay

## 2021-03-19 DIAGNOSIS — Z933 Colostomy status: Secondary | ICD-10-CM

## 2021-03-19 LAB — CBC
HCT: 24.5 % — ABNORMAL LOW (ref 39.0–52.0)
Hemoglobin: 7.6 g/dL — ABNORMAL LOW (ref 13.0–17.0)
MCH: 28.1 pg (ref 26.0–34.0)
MCHC: 31 g/dL (ref 30.0–36.0)
MCV: 90.7 fL (ref 80.0–100.0)
Platelets: 472 10*3/uL — ABNORMAL HIGH (ref 150–400)
RBC: 2.7 MIL/uL — ABNORMAL LOW (ref 4.22–5.81)
RDW: 16.4 % — ABNORMAL HIGH (ref 11.5–15.5)
WBC: 17.3 10*3/uL — ABNORMAL HIGH (ref 4.0–10.5)
nRBC: 0.6 % — ABNORMAL HIGH (ref 0.0–0.2)

## 2021-03-19 LAB — BASIC METABOLIC PANEL
Anion gap: 5 (ref 5–15)
BUN: 34 mg/dL — ABNORMAL HIGH (ref 8–23)
CO2: 21 mmol/L — ABNORMAL LOW (ref 22–32)
Calcium: 8.3 mg/dL — ABNORMAL LOW (ref 8.9–10.3)
Chloride: 110 mmol/L (ref 98–111)
Creatinine, Ser: 1.36 mg/dL — ABNORMAL HIGH (ref 0.61–1.24)
GFR, Estimated: 56 mL/min — ABNORMAL LOW (ref >60)
Glucose, Bld: 132 mg/dL — ABNORMAL HIGH (ref 70–99)
Potassium: 4.8 mmol/L (ref 3.5–5.1)
Sodium: 136 mmol/L (ref 135–145)

## 2021-03-19 LAB — PHOSPHORUS: Phosphorus: 4.5 mg/dL (ref 2.5–4.6)

## 2021-03-19 LAB — MAGNESIUM: Magnesium: 2.2 mg/dL (ref 1.7–2.4)

## 2021-03-19 MED ORDER — IOHEXOL 9 MG/ML PO SOLN: 500.0000 mL | ORAL | Status: AC

## 2021-03-19 MED ORDER — IOHEXOL 300 MG/ML  SOLN
75.0000 mL | Freq: Once | INTRAMUSCULAR | Status: AC | PRN
  Administered 2021-03-19: 75 mL via INTRAVENOUS

## 2021-03-19 MED ORDER — PIPERACILLIN-TAZOBACTAM 3.375 G IVPB
3.3750 g | Freq: Three times a day (TID) | INTRAVENOUS | Status: DC
  Administered 2021-03-19 – 2021-03-28 (×27): 3.375 g via INTRAVENOUS
  Filled 2021-03-19 (×27): qty 50

## 2021-03-19 MED ORDER — NALOXONE HCL 0.4 MG/ML IJ SOLN: 0.4000 mg | INTRAMUSCULAR | Status: DC | PRN

## 2021-03-19 MED ORDER — HYDROMORPHONE HCL 1 MG/ML IJ SOLN
1.0000 mg | INTRAMUSCULAR | Status: DC | PRN
  Administered 2021-03-19: 1 mg via INTRAVENOUS
  Filled 2021-03-19: qty 1

## 2021-03-19 MED ORDER — SODIUM CHLORIDE (PF) 0.9 % IJ SOLN
INTRAMUSCULAR | Status: AC
  Filled 2021-03-19: qty 50

## 2021-03-19 MED ORDER — SODIUM CHLORIDE 0.9 % IV BOLUS
500.0000 mL | Freq: Once | INTRAVENOUS | Status: AC
  Administered 2021-03-19: 500 mL via INTRAVENOUS

## 2021-03-19 MED ORDER — TRAVASOL 10 % IV SOLN
INTRAVENOUS | Status: DC
  Filled 2021-03-19: qty 1123.2

## 2021-03-19 MED ORDER — IOHEXOL 9 MG/ML PO SOLN
ORAL | Status: AC
  Administered 2021-03-19: 500 mL via ORAL
  Filled 2021-03-19: qty 1000

## 2021-03-19 MED ORDER — HYDROMORPHONE HCL 1 MG/ML IJ SOLN
1.0000 mg | INTRAMUSCULAR | Status: DC | PRN
  Administered 2021-03-20: 1 mg via INTRAVENOUS
  Administered 2021-03-20: 2 mg via INTRAVENOUS
  Administered 2021-03-20: 1 mg via INTRAVENOUS
  Administered 2021-03-20 (×2): 2 mg via INTRAVENOUS
  Administered 2021-03-20: 1 mg via INTRAVENOUS
  Administered 2021-03-20 – 2021-03-24 (×26): 2 mg via INTRAVENOUS
  Filled 2021-03-19: qty 1
  Filled 2021-03-19: qty 2
  Filled 2021-03-19: qty 1
  Filled 2021-03-19 (×3): qty 2
  Filled 2021-03-19: qty 1
  Filled 2021-03-19 (×25): qty 2

## 2021-03-19 MED ORDER — HYDROMORPHONE HCL 1 MG/ML IJ SOLN: 1.0000 mg | INTRAMUSCULAR | Status: DC | PRN

## 2021-03-19 MED ORDER — METHOCARBAMOL 1000 MG/10ML IJ SOLN
1000.0000 mg | Freq: Four times a day (QID) | INTRAVENOUS | Status: DC | PRN
  Administered 2021-03-20 – 2021-04-08 (×16): 1000 mg via INTRAVENOUS
  Filled 2021-03-19: qty 10
  Filled 2021-03-19 (×15): qty 1000

## 2021-03-19 NOTE — Plan of Care (Signed)
  Problem: Education: Goal: Knowledge of ostomy care will improve Outcome: Progressing Goal: Understanding of discharge needs will improve Outcome: Progressing   Problem: Coping: Goal: Coping ability will improve Outcome: Progressing   Problem: Fluid Volume: Goal: Ability to achieve a balanced intake and output will improve Outcome: Progressing   Problem: Health Behavior/Discharge Planning: Goal: Ability to manage health-related needs will improve Outcome: Progressing   Problem: Education: Goal: Knowledge of General Education information will improve Description: Including pain rating scale, medication(s)/side effects and non-pharmacologic comfort measures Outcome: Progressing   Problem: Coping: Goal: Level of anxiety will decrease Outcome: Progressing

## 2021-03-19 NOTE — Progress Notes (Signed)
Referring Physician(s): Gross,S  Supervising Physician: Jacqulynn Cadet  Patient Status:  [facility] - In-pt  Chief Complaint:  Abdominal abscess S/p LUQ fluid collection aspiration and drain placement 03/12/21 at bedside by Dr. Pascal Lux  Subjective:  Patient laying in bed, not in acute distress. Denies abdominal pain, nausea, vomiting, diarrhea, constipation.  Allergies: Nsaids  Medications: Prior to Admission medications   Medication Sig Start Date End Date Taking? Authorizing Provider  Diclofenac Sodium 2 % SOLN Place 2 g onto the skin 2 (two) times daily. Patient taking differently: Place 2 g onto the skin daily as needed (pain). 06/30/19  Yes Lyndal Pulley, DO  docusate sodium (COLACE) 100 MG capsule Take 100 mg by mouth daily as needed for mild constipation.    Yes [provider]  HYDROcodone-acetaminophen (NORCO/VICODIN) 5-325 MG tablet Take 1 tablet by mouth every 6 (six) hours as needed for moderate pain. 02/12/21  Yes Owens Shark, NP  hydrocortisone (ANUSOL-HC) 2.5 % rectal cream Place 1 application rectally 2 (two) times daily. Patient taking differently: Place 1 application rectally daily as needed for hemorrhoids or anal itching. 01/28/21  Yes Marrian Salvage, FNP  lidocaine-prilocaine (EMLA) cream Apply 1 application topically as needed. Patient taking differently: Apply 1 application topically as needed (prior to port use). 10/21/20  Yes Ladell Pier, MD  pravastatin (PRAVACHOL) 20 MG tablet TAKE 1 TABLET(20 MG) BY MOUTH DAILY Patient taking differently: Take 20 mg by mouth daily. 11/12/20  Yes Marrian Salvage, FNP  capecitabine (XELODA) 500 MG tablet TAKE 2000 MG (4 TABLETS) EVERY MORNING AND 1500 MG (3 TABLETS) EVERY EVENING FOR A TOTAL DAILY DOSE OF 3500 MG ON RADIATION DAYS ONLY. Patient not taking: No sig reported 11/04/20 11/04/21  Ladell Pier, MD  diphenoxylate-atropine (LOMOTIL) 2.5-0.025 MG tablet Take 1-2 tablets by mouth 3  (three) times daily as needed for diarrhea or loose stools (Maxiumum of 6/day). Patient not taking: No sig reported 12/25/20   Ladell Pier, MD  ondansetron (ZOFRAN) 8 MG tablet Take 1 tablet (8 mg total) by mouth every 8 (eight) hours as needed for nausea or vomiting. Start 72 hours after each IV chemotherapy treatment 07/30/20   Ladell Pier, MD  prochlorperazine (COMPAZINE) 10 MG tablet Take 1 tablet (10 mg total) by mouth every 6 (six) hours as needed for nausea. 07/30/20   Ladell Pier, MD     Vital Signs: BP (!) 166/60 (BP Location: Left Arm)   Pulse (!) 105   Temp 99.5 F (37.5 C) (Oral)   Resp (!) 22   Wt 88.1 kg   SpO2 98%   BMI 27.87 kg/m   Physical Exam Constitutional:      General: He is not in acute distress.    Appearance: He is ill-appearing.  HENT:     Head: Normocephalic and atraumatic.  Cardiovascular:     Rate and Rhythm: Tachycardia present.  Pulmonary:     Effort: Pulmonary effort is normal. No respiratory distress.  Abdominal:     General: Abdomen is flat.     Palpations: Abdomen is soft.     Comments: Positive LUQ drain to a gravity bag. Site is unremarkable with no erythema, edema, tenderness, bleeding or drainage. Suture and stat lock in place. Dressing is clean, dry, and intact. 80 ml of thick, tan colored fluid noted in the the gravity bag. Drain aspirates and flushes well.   Skin:    General: Skin is warm and dry.  Neurological:  Mental Status: He is alert. Mental status is at baseline.      Imaging: No results found.  Labs:  CBC: Recent Labs    03/16/21 0246 03/17/21 0018 03/18/21 0400 03/19/21 0354  WBC 14.2* 11.4* 13.9* 17.3*  HGB 8.6* 8.3* 7.5* 7.6*  HCT 27.4* 26.1* 24.1* 24.5*  PLT 271 312 356 472*    COAGS: No results for input(s): INR, APTT in the last 8760 hours.  BMP: Recent Labs    07/04/20 0845 07/15/20 0048 08/13/20 0906 03/16/21 0246 03/17/21 0018 03/18/21 0400 03/19/21 0354  NA 138 136   < >  148* 141 139 136  K 3.8 3.7   < > 4.0 4.0 4.4 4.8  CL 108 106   < > 120* 115* 114* 110  CO2 25 21*   < > 22 21* 22 21*  GLUCOSE 105* 100*   < > 134* 135* 124* 132*  BUN 12 21   < > 27* 26* 24* 34*  CALCIUM 9.9 9.2   < > 8.2* 8.1* 8.1* 8.3*  CREATININE 0.96 0.94   < > 1.28* 1.10 0.74 1.36*  GFRNONAA >60 >60   < > 60* >60 >60 56*  GFRAA >60 >60  --   --   --   --   --    < > = values in this interval not displayed.    LIVER FUNCTION TESTS: Recent Labs    03/11/21 0231 03/13/21 0517 03/15/21 0133 03/17/21 0018  BILITOT 1.2 0.7 0.7 0.6  AST 30 27 53* 44*  ALT 22 19 40 36  ALKPHOS 46 71 84 78  PROT 5.1* 5.4* 5.8* 6.2*  ALBUMIN 2.2* 2.1* 2.0* 2.0*    Assessment and Plan: 70 y/o male admitted for lower anterior resection for rectal cancer who subsequently developed shock and intra-abdominal fluid collection.  S/p LUQ fluid collection aspiration and drain placement at the bedside with Dr. Pascal Lux on 5/3.  LUQ drain intact, 80 cc of thick tan colored fluid in the gravity bag.  OP 150 cc overnight Afebrile hgb stable  WBC elevated from 13.9 yesterday to 17.3 today, CCS ordered CT abd/pelvis - pending.  Continue with flushing TID, output recording q shift and dressing changes as needed. Would consider additional imaging when output is less than 10 ml for 24 hours not including flush material.   Further treatment plan per CCS/PCCM/GI/TRH Appreciate and agree with the plan.  Will review CT abd/pelvis once done.  IR to follow.     Electronically Signed: Tera Mater, PA-C 03/19/2021, 1:43 PM   I spent a total of 15 minutes at the the patient's bedside AND on the patient's hospital floor or unit, greater than 50% of which was counseling/coordinating care for left upper abdominal abscess drain

## 2021-03-19 NOTE — Progress Notes (Signed)
Rectal carcinoma ([place])  Subjective: Having more abd pain this AM.  HGB stable, wbc up.  Tolerating fulls, denies nausea  Objective: Vital signs in last 24 hours: Temp:  [97.8 F (36.6 C)-98.5 F (36.9 C)] 98.1 F (36.7 C) (05/11 0548) Pulse Rate:  [88-96] 92 (05/11 0548) Resp:  [18-20] 20 (05/11 0548) BP: (126-143)/(50-64) 143/64 (05/11 0548) SpO2:  [99 %-100 %] 100 % (05/11 0548) Weight:  [88.1 kg] 88.1 kg (05/11 0500) Last BM Date: 03/17/21  Intake/Output from previous day: 05/10 0701 - 05/11 0700 In: 2045.6 [I.V.:2045.6] Out: 5102 [Urine:1600; Drains:150] Intake/Output this shift: No intake/output data recorded.  General appearance: alert and cooperative GI: normal findings: soft, TTP diffusely, no peritonitis Incision/Wound: clean, dry, intact JP: clear Ostomy: bilious stool in bag, beefy red LLQ drain: purulent output  Lab Results:  Results for orders placed or performed during the hospital encounter of 03/05/21 (from the past 24 hour(s))  Glucose, capillary     Status: Abnormal   Collection Time: 03/18/21 11:39 AM  Result Value Ref Range   Glucose-Capillary 145 (H) 70 - 99 mg/dL  Glucose, capillary     Status: Abnormal   Collection Time: 03/18/21  6:36 PM  Result Value Ref Range   Glucose-Capillary 123 (H) 70 - 99 mg/dL  CBC     Status: Abnormal   Collection Time: 03/19/21  3:54 AM  Result Value Ref Range   WBC 17.3 (H) 4.0 - 10.5 K/uL   RBC 2.70 (L) 4.22 - 5.81 MIL/uL   Hemoglobin 7.6 (L) 13.0 - 17.0 g/dL   HCT 24.5 (L) 39.0 - 52.0 %   MCV 90.7 80.0 - 100.0 fL   MCH 28.1 26.0 - 34.0 pg   MCHC 31.0 30.0 - 36.0 g/dL   RDW 16.4 (H) 11.5 - 15.5 %   Platelets 472 (H) 150 - 400 K/uL   nRBC 0.6 (H) 0.0 - 0.2 %  Basic metabolic panel     Status: Abnormal   Collection Time: 03/19/21  3:54 AM  Result Value Ref Range   Sodium 136 135 - 145 mmol/L   Potassium 4.8 3.5 - 5.1 mmol/L   Chloride 110 98 - 111 mmol/L   CO2 21 (L) 22 - 32 mmol/L   Glucose, Bld 132 (H)  70 - 99 mg/dL   BUN 34 (H) 8 - 23 mg/dL   Creatinine, Ser 1.36 (H) 0.61 - 1.24 mg/dL   Calcium 8.3 (L) 8.9 - 10.3 mg/dL   GFR, Estimated 56 (L) >60 mL/min   Anion gap 5 5 - 15  Magnesium     Status: None   Collection Time: 03/19/21  3:54 AM  Result Value Ref Range   Magnesium 2.2 1.7 - 2.4 mg/dL  Phosphorus     Status: None   Collection Time: 03/19/21  3:54 AM  Result Value Ref Range   Phosphorus 4.5 2.5 - 4.6 mg/dL     Studies/Results Radiology     MEDS, Scheduled . acetaminophen  1,000 mg Oral Q6H  . bacitracin   Topical BID  . chlorhexidine  15 mL Mouth Rinse BID  . Chlorhexidine Gluconate Cloth  6 each Topical Daily  . feeding supplement  237 mL Oral BID BM  . lip balm  1 application Topical BID  . mouth rinse  15 mL Mouth Rinse q12n4p  . melatonin  3 mg Oral QHS  . pantoprazole  40 mg Intravenous Q12H  . sodium chloride flush  10-40 mL Intracatheter Q12H  . sodium  chloride flush  5 mL Intracatheter Q8H  . sucralfate  1 g Oral TID WC & HS     Assessment: Rectal carcinoma (HCC)  POD 12 LAR and diverting ostomy Post op abscess: IR drain in LLQ, completed course of Zosyn and Eraxis Hemorrhagic esophagitis  Plan: Cont TPN, Fulls as tolerated today Acute blood loss anemia: Hgb stable today, has had IV Fe, repeat CBC in AM ID: wbc up and down, will repeat CT abd today to eval for any further fluid collections, will restart Zosyn for now RT for pulm toilet PT for ambulation PO pain meds  Cont Carafate, and IV PPI for esophagitis     LOS: 14 days    Rosario Adie, MD  [facility], [place]    03/19/2021 9:01 AM

## 2021-03-19 NOTE — Progress Notes (Signed)
PHARMACY - TOTAL PARENTERAL NUTRITION CONSULT NOTE   Indication: Prolonged ileus  Patient Measurements: Weight: 88.1 kg (194 lb 3.6 oz)   Body mass index is 27.87 kg/m.   Assessment: 70 yo male with rectal cancer s/p xi robotic assisted lower anterior resection with end colostomy on 4/27 now with prolonged ileus to start TPN on 5/2.  Glucose / Insulin: on sSSI q6h--> d/c on 5/10 - CBGs continue to be at goal < 150 Electrolytes: K trending up to 4.8, phos up 4.5;  Cl improved to 110, CO2 slightly low at 21; other lytes wnl Renal: SCr up 1.36, BUN up 34 Hepatic: AST/ALT 44/36 improved, tbili 0.7, alkphos 78 --> per labs on 5/9 Albumin/prealbumin: alb 2, prealb <6.6 (5/9) Intake / Output; MIVF: I/O last 24hr net +295 - drain output 150 ml  - UOP 1600 mL -  D5W at 10 ml/hr per Md GI Imaging: GI Surgeries / Procedures:  4/27:  xi robotic assisted lower anterior resection with end colostomy on 4/27 5/6: NGT out and unable to be replaced thus far  Central access: 5/2 TPN start date: 5/2  Nutritional Goals (per RD recommendation on 5/3): 2200-2400 kcal/day, protein 90-115 g/day, and >2L/day  Goal TPN rate is 90 mL/hr (provides 114 g of protein and 2199 kcals per day)  - 5/9: starting clears - 5/10: adv to fulls   Current Nutrition:  - TPN - 5/10:  ensure surgery 237 mL BID   Plan:   Continue TPN at goal 68mL/hr at 1800  Electrolytes in TPN:   Increase Na to 50 mEq/L  Reduce K to 20 mEq/L  Ca 67mEq/L  Mg 28mEq/L  Reduce Phos to 10 mmol/L  Cl:Ac - max Ac  Add standard MVI and trace elements to TPN  D5W at 10 ml/hr per Md  Monitor TPN labs on Mon/Thurs  Dia Sitter, PharmD, BCPS 03/19/2021 7:31 AM

## 2021-03-19 NOTE — Progress Notes (Signed)
The proposed treatment discussed in conference is for discussion purposes only and is not a binding recommendation.  The patients have not been physically examined, or presented with their treatment options.  Therefore, final treatment plans cannot be decided.   

## 2021-03-19 NOTE — Progress Notes (Signed)
Attempted to get patient to drink contrast for abdominal CT scan. Immediately after taking each sip the patient would have a strong wet cough, along with gurgling and burping. Informed Dr. Marcello Moores and she made the patient NPO until we get the CT scan, which includes no further intake of oral contrast. Informed CT scan of how much she had concumed and that the doctor wanted to go ahead with the scan.

## 2021-03-20 ENCOUNTER — Inpatient Hospital Stay (HOSPITAL_COMMUNITY): Payer: Medicare Other

## 2021-03-20 ENCOUNTER — Other Ambulatory Visit: Payer: Self-pay | Admitting: *Deleted

## 2021-03-20 ENCOUNTER — Telehealth: Payer: Self-pay | Admitting: Oncology

## 2021-03-20 ENCOUNTER — Encounter (HOSPITAL_COMMUNITY): Payer: Self-pay | Admitting: General Surgery

## 2021-03-20 DIAGNOSIS — C20 Malignant neoplasm of rectum: Secondary | ICD-10-CM | POA: Diagnosis not present

## 2021-03-20 LAB — COMPREHENSIVE METABOLIC PANEL
ALT: 33 U/L (ref 0–44)
AST: 38 U/L (ref 15–41)
Albumin: 1.8 g/dL — ABNORMAL LOW (ref 3.5–5.0)
Alkaline Phosphatase: 104 U/L (ref 38–126)
Anion gap: 7 (ref 5–15)
BUN: 57 mg/dL — ABNORMAL HIGH (ref 8–23)
CO2: 21 mmol/L — ABNORMAL LOW (ref 22–32)
Calcium: 8.4 mg/dL — ABNORMAL LOW (ref 8.9–10.3)
Chloride: 110 mmol/L (ref 98–111)
Creatinine, Ser: 2.93 mg/dL — ABNORMAL HIGH (ref 0.61–1.24)
GFR, Estimated: 22 mL/min — ABNORMAL LOW (ref >60)
Glucose, Bld: 112 mg/dL — ABNORMAL HIGH (ref 70–99)
Potassium: 5.4 mmol/L — ABNORMAL HIGH (ref 3.5–5.1)
Sodium: 138 mmol/L (ref 135–145)
Total Bilirubin: 0.2 mg/dL — ABNORMAL LOW (ref 0.3–1.2)
Total Protein: 6.2 g/dL — ABNORMAL LOW (ref 6.5–8.1)

## 2021-03-20 LAB — CBC
HCT: 24.2 % — ABNORMAL LOW (ref 39.0–52.0)
Hemoglobin: 7.6 g/dL — ABNORMAL LOW (ref 13.0–17.0)
MCH: 28.1 pg (ref 26.0–34.0)
MCHC: 31.4 g/dL (ref 30.0–36.0)
MCV: 89.6 fL (ref 80.0–100.0)
Platelets: 549 10*3/uL — ABNORMAL HIGH (ref 150–400)
RBC: 2.7 MIL/uL — ABNORMAL LOW (ref 4.22–5.81)
RDW: 16.8 % — ABNORMAL HIGH (ref 11.5–15.5)
WBC: 21.6 10*3/uL — ABNORMAL HIGH (ref 4.0–10.5)
nRBC: 0.3 % — ABNORMAL HIGH (ref 0.0–0.2)

## 2021-03-20 LAB — PHOSPHORUS: Phosphorus: 5.5 mg/dL — ABNORMAL HIGH (ref 2.5–4.6)

## 2021-03-20 LAB — MAGNESIUM: Magnesium: 2.4 mg/dL (ref 1.7–2.4)

## 2021-03-20 LAB — SURGICAL PATHOLOGY

## 2021-03-20 LAB — PROTIME-INR
INR: 1.3 — ABNORMAL HIGH (ref 0.8–1.2)
Prothrombin Time: 15.9 seconds — ABNORMAL HIGH (ref 11.4–15.2)

## 2021-03-20 MED ORDER — DEXTROSE-NACL 5-0.2 % IV SOLN: INTRAVENOUS | Status: DC

## 2021-03-20 MED ORDER — FENTANYL CITRATE (PF) 100 MCG/2ML IJ SOLN
INTRAMUSCULAR | Status: AC
  Filled 2021-03-20: qty 2

## 2021-03-20 MED ORDER — FENTANYL CITRATE (PF) 100 MCG/2ML IJ SOLN
INTRAMUSCULAR | Status: AC | PRN
  Administered 2021-03-20 (×2): 50 ug via INTRAVENOUS

## 2021-03-20 MED ORDER — SODIUM CHLORIDE 0.9% FLUSH
5.0000 mL | Freq: Three times a day (TID) | INTRAVENOUS | Status: DC
  Administered 2021-03-20 – 2021-03-28 (×21): 5 mL
  Administered 2021-03-29: 10 mL
  Administered 2021-03-29 – 2021-04-02 (×13): 5 mL
  Administered 2021-04-02: 30 mL
  Administered 2021-04-03 – 2021-04-09 (×18): 5 mL

## 2021-03-20 MED ORDER — SODIUM CHLORIDE 0.9 % IV BOLUS
1000.0000 mL | Freq: Once | INTRAVENOUS | Status: AC
  Administered 2021-03-20: 1000 mL via INTRAVENOUS

## 2021-03-20 MED ORDER — MIDAZOLAM HCL 2 MG/2ML IJ SOLN
INTRAMUSCULAR | Status: AC | PRN
  Administered 2021-03-20 (×2): 1 mg via INTRAVENOUS

## 2021-03-20 MED ORDER — LIDOCAINE HCL (PF) 1 % IJ SOLN
INTRAMUSCULAR | Status: AC | PRN
  Administered 2021-03-20: 10 mL

## 2021-03-20 MED ORDER — TRAVASOL 10 % IV SOLN
INTRAVENOUS | Status: AC
  Filled 2021-03-20: qty 1123.2

## 2021-03-20 MED ORDER — MIDAZOLAM HCL 2 MG/2ML IJ SOLN
INTRAMUSCULAR | Status: AC
  Filled 2021-03-20: qty 2

## 2021-03-20 NOTE — Consult Note (Signed)
Shane [name] ostomy follow up Stoma type/location: LLQ, end colostomy Stomal assessment/size: 1 3/4" x 1 5/8" oval shaped, flush, pink, moist Peristomal assessment: MCS noted at 3 o'clock with slough in the bed Treatment options for stomal/peristomal skin: 2" skin barrier ring Output liquid brown Ostomy pouching: 1pc.flat with 2" skin barrier ring Education provided: [name] is CMA and familiar with pouch change. Today [name] is at the bedside and observed pouch change. Explained ostomy creation to her. Patient is not able to participate due to pain and tachypnea  Enrolled patient in [place] Start Discharge program: Yes  [place] [name] will follow along with you for continued support with ostomy teaching and care [place] MSN, [place], [place], [place], [place]

## 2021-03-20 NOTE — Plan of Care (Signed)
  Problem: Education: Goal: Knowledge of General Education information will improve Description: Including pain rating scale, medication(s)/side effects and non-pharmacologic comfort measures Outcome: Progressing   Problem: Coping: Goal: Level of anxiety will decrease Outcome: Progressing   Problem: Safety: Goal: Ability to remain free from injury will improve Outcome: Progressing   

## 2021-03-20 NOTE — Progress Notes (Signed)
Pt had issues with uncontrolled pain throughout the night after receiving multiple doses of PRN meds. Hospitalist  and [facility] notified and received new orders. Pain management improved with new orders.

## 2021-03-20 NOTE — Procedures (Signed)
Pre procedural Dx: Post-op fluid collection Post procedural Dx: Same  Technically successful CT guided placed of a 12 Fr drainage catheter placement into the right lower abdomen/pelvis yielding 75 cc of purulent appearing fluid.    A representative aspirated sample was capped and sent to the laboratory for analysis.    EBL: Trace Complications: None immediate  Ronny Bacon, MD Pager #: [phone]

## 2021-03-20 NOTE — Progress Notes (Signed)
Referring Physician(s): Gross  Supervising Physician: Sandi Mariscal  Patient Status:  [facility] - In-pt  Chief Complaint:  Abdominal fluid collection  Brief History:  Shane [name] is a 52 y/omaleadmitted for low anterior resection for rectal cancer who subsequently developed shock and intra-abdominal fluid collection.  He is s/p LUQ fluid collection aspiration and drain placement at the bedside with Dr. Pascal Lux on 5/3.  WBC elevated from 13.9 to 17.3 today so CCS ordered CT abd/pelvis.  CT showed= New right abdominopelvic loculated ascites, suspicious for developing abscess or infected ascites.  Images reviewed by Dr. Pascal Lux and amenable to drain placement.  Subjective:  Lying in bed. Comfortable. Daughter in room. Daughter was concerned about left drain, worried the skin was infected.  Allergies: Nsaids  Medications: Prior to Admission medications   Medication Sig Start Date End Date Taking? Authorizing Provider  Diclofenac Sodium 2 % SOLN Place 2 g onto the skin 2 (two) times daily. Patient taking differently: Place 2 g onto the skin daily as needed (pain). 06/30/19  Yes Lyndal Pulley, DO  docusate sodium (COLACE) 100 MG capsule Take 100 mg by mouth daily as needed for mild constipation.    Yes [provider]  HYDROcodone-acetaminophen (NORCO/VICODIN) 5-325 MG tablet Take 1 tablet by mouth every 6 (six) hours as needed for moderate pain. 02/12/21  Yes Owens Shark, NP  hydrocortisone (ANUSOL-HC) 2.5 % rectal cream Place 1 application rectally 2 (two) times daily. Patient taking differently: Place 1 application rectally daily as needed for hemorrhoids or anal itching. 01/28/21  Yes Marrian Salvage, FNP  lidocaine-prilocaine (EMLA) cream Apply 1 application topically as needed. Patient taking differently: Apply 1 application topically as needed (prior to port use). 10/21/20  Yes Ladell Pier, MD  pravastatin (PRAVACHOL) 20 MG tablet TAKE 1 TABLET(20 MG)  BY MOUTH DAILY Patient taking differently: Take 20 mg by mouth daily. 11/12/20  Yes Marrian Salvage, FNP  capecitabine (XELODA) 500 MG tablet TAKE 2000 MG (4 TABLETS) EVERY MORNING AND 1500 MG (3 TABLETS) EVERY EVENING FOR A TOTAL DAILY DOSE OF 3500 MG ON RADIATION DAYS ONLY. Patient not taking: No sig reported 11/04/20 11/04/21  Ladell Pier, MD  diphenoxylate-atropine (LOMOTIL) 2.5-0.025 MG tablet Take 1-2 tablets by mouth 3 (three) times daily as needed for diarrhea or loose stools ([place]um of 6/day). Patient not taking: No sig reported 12/25/20   Ladell Pier, MD  ondansetron (ZOFRAN) 8 MG tablet Take 1 tablet (8 mg total) by mouth every 8 (eight) hours as needed for nausea or vomiting. Start 72 hours after each IV chemotherapy treatment 07/30/20   Ladell Pier, MD  prochlorperazine (COMPAZINE) 10 MG tablet Take 1 tablet (10 mg total) by mouth every 6 (six) hours as needed for nausea. 07/30/20   Ladell Pier, MD     Vital Signs: BP (!) 153/63 (BP Location: Left Arm)   Pulse (!) 101   Temp 98.6 F (37 C)   Resp 19   Wt 83.7 kg   SpO2 100%   BMI 26.48 kg/m   Physical Exam Constitutional:      Appearance: Normal appearance.  HENT:     Head: Normocephalic and atraumatic.  Cardiovascular:     Rate and Rhythm: Normal rate and regular rhythm.  Pulmonary:     Effort: Pulmonary effort is normal. No respiratory distress.     Breath sounds: Normal breath sounds.  Abdominal:     Palpations: Abdomen is soft.  Tenderness: There is no abdominal tenderness.     Comments: Surgical dressings in place. Surgical drain on right, IR drain on left. There is no redness at the exit site and no drainage at the site. Purulent tan output from both. ~110 mL output from IR drain. ~30 mL output from surgical drain.  Skin:    General: Skin is warm and dry.  Neurological:     General: No focal deficit present.     Mental Status: He is alert.  Psychiatric:        Mood and Affect:  Mood normal.        Behavior: Behavior normal.     Imaging: CT ABDOMEN PELVIS W CONTRAST  Result Date: 03/19/2021 CLINICAL DATA:  Rectal cancer, status post low anterior resection with end colostomy. Intra-abdominal abscess. EXAM: CT ABDOMEN AND PELVIS WITH CONTRAST TECHNIQUE: Multidetector CT imaging of the abdomen and pelvis was performed using the standard protocol following bolus administration of intravenous contrast. CONTRAST:  35mL OMNIPAQUE IOHEXOL 300 MG/ML  SOLN COMPARISON:  03/11/2021 FINDINGS: Lower chest: Bibasilar airspace disease. Small bilateral pleural effusions are similar. Normal heart size. Incompletely imaged central line. Distal esophageal wall thickening. Hepatobiliary: Right hepatic lobe fluid density measures 5.0 cm on 21/2 versus 4.3 cm on the prior exam. New since a CT of 10/31/2020. Slightly more masslike today, favoring subcapsular fluid. Compressed gallbladder without surrounding edema or biliary duct dilatation. Pancreas: Normal, without mass or ductal dilatation. Spleen: Normal in size, without focal abnormality. Adrenals/Urinary Tract: Normal adrenal glands. Bilateral mild hydroureteronephrosis, new. Decreased contrast excretion into the kidneys. Mild bladder distension. Air within the urinary bladder. Stomach/Bowel: The stomach is mildly thick walled on 23/2. Nasogastric tube no longer identified. End colostomy.  Decompressed colon. Jejunal loops measure up to 3.4 cm, without focal transition point. Vascular/Lymphatic: Aortic atherosclerosis. No abdominopelvic adenopathy. Reproductive: Possible TURP defect. Bilateral left larger than right hydroceles are incompletely imaged. Other: A percutaneous drain in the left upper quadrant surrounds a significantly decreased fluid and gas collection including on 31/2. Example 3.9 x 3.9 cm today versus on the order of 8.5 x 6.1 cm on the prior. Likely contiguous fluid surrounding the medial spleen including on 19/2. A new collection of  loculated ascites within the right abdomen and pelvis is relatively ill-defined superiorly at 6.7 x 4.2 cm on 58/2. More well-defined in the pelvis at 4.9 x 6.1 cm on 73/2. Cul-de-sac fluid collection including at 5.9 x 5.3 cm on 80/2 is relatively similar in size to on the prior exam and has a surgical drain within. Anasarca. There may be a right abdominal wall fluid collection of 1.8 x 8.1 cm on 62/2, slightly more well-defined than on the prior. Musculoskeletal: Lumbosacral spondylosis. IMPRESSION: 1. Interval percutaneous drain placement within the left upper quadrant. Small surrounding gas and fluid collection. 2. New right abdominopelvic loculated ascites, suspicious for developing abscess or infected ascites. 3. Pelvic cul-de-sac collection is similar in size as a surgical drain within. 4. New bilateral hydroureteronephrosis, possibly related to bladder distension. 5. Persistent bilateral pleural effusions with adjacent consolidation, most likely atelectasis. 6. Subcapsular right hepatic lobe fluid collection is felt to be slightly increased. 7. Favor postoperative adynamic ileus without high-grade obstruction. 8. Bilateral hydroceles. 9. Gastric and esophageal wall thickening, suspicious for gastritis and esophagitis. 10. Possible developing tiny right abdominal wall fluid collection. Recommend attention on follow-up. Electronically Signed   By: Abigail Miyamoto M.D.   On: 03/19/2021 18:31    Labs:  CBC: Recent Labs  03/17/21 0018 03/18/21 0400 03/19/21 0354 03/20/21 0313  WBC 11.4* 13.9* 17.3* 21.6*  HGB 8.3* 7.5* 7.6* 7.6*  HCT 26.1* 24.1* 24.5* 24.2*  PLT 312 356 472* 549*    COAGS: Recent Labs    03/20/21 0313  INR 1.3*    BMP: Recent Labs    07/04/20 0845 07/15/20 0048 08/13/20 0906 03/17/21 0018 03/18/21 0400 03/19/21 0354 03/20/21 0313  NA 138 136   < > 141 139 136 138  K 3.8 3.7   < > 4.0 4.4 4.8 5.4*  CL 108 106   < > 115* 114* 110 110  CO2 25 21*   < > 21* 22 21*  21*  GLUCOSE 105* 100*   < > 135* 124* 132* 112*  BUN 12 21   < > 26* 24* 34* 57*  CALCIUM 9.9 9.2   < > 8.1* 8.1* 8.3* 8.4*  CREATININE 0.96 0.94   < > 1.10 0.74 1.36* 2.93*  GFRNONAA >60 >60   < > >60 >60 56* 22*  GFRAA >60 >60  --   --   --   --   --    < > = values in this interval not displayed.    LIVER FUNCTION TESTS: Recent Labs    03/13/21 0517 03/15/21 0133 03/17/21 0018 03/20/21 0313  BILITOT 0.7 0.7 0.6 0.2*  AST 27 53* 44* 38  ALT 19 40 36 33  ALKPHOS 71 84 78 104  PROT 5.4* 5.8* 6.2* 6.2*  ALBUMIN 2.1* 2.0* 2.0* 1.8*    Assessment and Plan:  Low anterior resection for rectal cancer.  S/p LUQ fluid collection aspiration and drain placement at the bedside with Dr. Pascal Lux on 5/3.  WBC elevated from 13.9 to 17.3 today so [facility] ordered CT abd/pelvis.  CT showed= New right abdominopelvic loculated ascites, suspicious for developing abscess or infected ascites.  Will proceed with placement of additional drain today by Dr. Pascal Lux.  Risks and benefits discussed with the patient and his daughter including bleeding, infection, damage to adjacent structures, bowel perforation/fistula connection, and sepsis.  All questions were answered, patient is agreeable to proceed. Consent signed and in chart.  Continue with flushing TID, output recording q shift and dressing changes as needed.  Consider additional imaging when output is less than 10 ml for 24 hours not including flush material.   Electronically Signed: Murrell Redden, PA-C 03/20/2021, 10:40 AM    I spent a total of 15 Minutes at the the patient's bedside AND on the patient's hospital floor or unit, greater than 50% of which was counseling/coordinating care for f/u drain and additional drain placment.

## 2021-03-20 NOTE — Progress Notes (Addendum)
HEMATOLOGY-ONCOLOGY PROGRESS NOTE  SUBJECTIVE: Shane [name] is followed by our office for rectal cancer.  He completed a course of neoadjuvant therapy.  The patient is postop day 12 LAR and diverting colostomy.  He had a postop abscess and has an IR drain in the left lower quadrant.  He completed a course of Zosyn and Eraxis.  The patient reports his abdominal pain a 6/10.  Pain is just over his incision.  He is not having any nausea or vomiting.  Fully catheter was being placed at the time my visit due to urinary retention.  Oncology History  Rectal carcinoma ([place])  07/18/2020 Initial Diagnosis   Rectal carcinoma ([place])   07/30/2020 -  Chemotherapy   The patient had ondansetron (ZOFRAN) 16 mg in sodium chloride 0.9 % 50 mL IVPB, 16 mg (100 % of original dose 16 mg), Intravenous,  Once, 8 of 8 cycles Dose modification: 16 mg (original dose 16 mg, Cycle 1) Administration: 16 mg (07/30/2020), 16 mg (08/13/2020), 16 mg (08/27/2020), 16 mg (09/09/2020), 16 mg (09/24/2020), 16 mg (10/07/2020), 16 mg (10/21/2020), 16 mg (11/04/2020) leucovorin 832 mg in dextrose 5 % 250 mL infusion, 400 mg/m2 = 832 mg, Intravenous,  Once, 8 of 8 cycles Administration: 832 mg (07/30/2020), 832 mg (08/13/2020), 832 mg (08/27/2020), 832 mg (09/09/2020), 832 mg (09/24/2020), 808 mg (10/07/2020), 808 mg (10/21/2020), 808 mg (11/04/2020) oxaliplatin (ELOXATIN) 175 mg in dextrose 5 % 500 mL chemo infusion, 85 mg/m2 = 175 mg, Intravenous,  Once, 8 of 8 cycles Dose modification: 65 mg/m2 (original dose 85 mg/m2, Cycle 5, Reason: Provider Judgment) Administration: 175 mg (07/30/2020), 175 mg (08/13/2020), 175 mg (08/27/2020), 175 mg (09/09/2020), 135 mg (09/24/2020), 170 mg (10/07/2020), 170 mg (10/21/2020), 170 mg (11/04/2020) fluorouracil (ADRUCIL) chemo injection 850 mg, 400 mg/m2 = 850 mg, Intravenous,  Once, 7 of 7 cycles Administration: 850 mg (07/30/2020), 850 mg (08/13/2020), 850 mg (08/27/2020), 850 mg (09/09/2020), 800 mg  (10/07/2020), 800 mg (10/21/2020), 800 mg (11/04/2020) fluorouracil (ADRUCIL) 5,000 mg in sodium chloride 0.9 % 150 mL chemo infusion, 2,400 mg/m2 = 5,000 mg, Intravenous, 1 Day/Dose, 8 of 8 cycles Administration: 5,000 mg (07/30/2020), 5,000 mg (08/13/2020), 5,000 mg (08/27/2020), 5,000 mg (09/09/2020), 5,000 mg (09/24/2020), 5,000 mg (10/07/2020), 4,850 mg (10/21/2020), 4,850 mg (11/04/2020)  for chemotherapy treatment.     PHYSICAL EXAMINATION:  Vitals:   03/19/21 2045 03/20/21 0640  BP: (!) 156/68 (!) 153/63  Pulse: (!) 106 (!) 101  Resp:  19  Temp: 99.3 F (37.4 C) 98.6 F (37 C)  SpO2: 100% 100%   Filed Weights   03/17/21 0500 03/19/21 0500 03/20/21 0500  Weight: 81.1 kg 88.1 kg 83.7 kg    Intake/Output from previous day: 05/11 0701 - 05/12 0700 In: 3441.8 [P.O.:240; I.V.:2962; IV Piggyback:239.9] Out: 940 [Urine:700; Drains:140; Stool:100]  GENERAL:alert, no distress and comfortable SKIN: skin color, texture, turgor are normal, no rashes or significant lesions LUNGS: clear to auscultation and percussion with normal breathing effort HEART: regular rate & rhythm and no murmurs and no lower extremity edema ABDOMEN:, Soft, tenderness to palpation diffusely, wound clean dry and intact, JP drain with purulent drainage, ostomy with liquid stool in bag NEURO: alert & oriented x 3 with fluent speech, no focal motor/sensory deficits HEENT: Erythema and ulceration at the tip of the tongue  LABORATORY DATA:  I have reviewed the data as listed CMP Latest Ref Rng & Units 03/20/2021 03/19/2021 03/18/2021  Glucose 70 - 99 mg/dL 112(H) 132(H) 124(H)  BUN 8 - 23  mg/dL 57(H) 34(H) 24(H)  Creatinine 0.61 - 1.24 mg/dL 2.93(H) 1.36(H) 0.74  Sodium 135 - 145 mmol/L 138 136 139  Potassium 3.5 - 5.1 mmol/L 5.4(H) 4.8 4.4  Chloride 98 - 111 mmol/L 110 110 114(H)  CO2 22 - 32 mmol/L 21(L) 21(L) 22  Calcium 8.9 - 10.3 mg/dL 8.4(L) 8.3(L) 8.1(L)  Total Protein 6.5 - 8.1 g/dL 6.2(L) - -  Total  Bilirubin 0.3 - 1.2 mg/dL 0.2(L) - -  Alkaline Phos 38 - 126 U/L 104 - -  AST 15 - 41 U/L 38 - -  ALT 0 - 44 U/L 33 - -    Lab Results  Component Value Date   WBC 21.6 (H) 03/20/2021   HGB 7.6 (L) 03/20/2021   HCT 24.2 (L) 03/20/2021   MCV 89.6 03/20/2021   PLT 549 (H) 03/20/2021   NEUTROABS 9.3 (H) 03/11/2021    DG Abd 1 View  Result Date: 03/09/2021 CLINICAL DATA:  NG tube placement EXAM: ABDOMEN - 1 VIEW COMPARISON:  None. FINDINGS: NG tube appears adequately positioned in the stomach. Paucity of bowel gas. IMPRESSION: NG tube adequately positioned in the stomach. Electronically Signed   By: Franki Cabot M.D.   On: 03/09/2021 18:33   DG Abd 1 View  Result Date: 03/05/2021 CLINICAL DATA:  Possible retained foreign body. EXAM: ABDOMEN - 1 VIEW; DG C-ARM 1-60 MIN-NO REPORT FLUOROSCOPY TIME:  3 seconds. COMPARISON:  None. FINDINGS: Four intraoperative fluoroscopic images were obtained of the abdomen. These demonstrate nasogastric tube tip in expected position of proximal stomach. There appears to be a surgical drain in the pelvis. There is no definite evidence of other radiopaque foreign body. IMPRESSION: Surgical drain seen in the pelvis. Nasogastric tube tip seen in expected position of proximal stomach. No other definite foreign body is noted. Electronically Signed   By: Marijo Conception M.D.   On: 03/05/2021 16:22   CT ABDOMEN PELVIS W CONTRAST  Result Date: 03/19/2021 CLINICAL DATA:  Rectal cancer, status post low anterior resection with end colostomy. Intra-abdominal abscess. EXAM: CT ABDOMEN AND PELVIS WITH CONTRAST TECHNIQUE: Multidetector CT imaging of the abdomen and pelvis was performed using the standard protocol following bolus administration of intravenous contrast. CONTRAST:  16m OMNIPAQUE IOHEXOL 300 MG/ML  SOLN COMPARISON:  03/11/2021 FINDINGS: Lower chest: Bibasilar airspace disease. Small bilateral pleural effusions are similar. Normal heart size. Incompletely imaged  central line. Distal esophageal wall thickening. Hepatobiliary: Right hepatic lobe fluid density measures 5.0 cm on 21/2 versus 4.3 cm on the prior exam. New since a CT of 10/31/2020. Slightly more masslike today, favoring subcapsular fluid. Compressed gallbladder without surrounding edema or biliary duct dilatation. Pancreas: Normal, without mass or ductal dilatation. Spleen: Normal in size, without focal abnormality. Adrenals/Urinary Tract: Normal adrenal glands. Bilateral mild hydroureteronephrosis, new. Decreased contrast excretion into the kidneys. Mild bladder distension. Air within the urinary bladder. Stomach/Bowel: The stomach is mildly thick walled on 23/2. Nasogastric tube no longer identified. End colostomy.  Decompressed colon. Jejunal loops measure up to 3.4 cm, without focal transition point. Vascular/Lymphatic: Aortic atherosclerosis. No abdominopelvic adenopathy. Reproductive: Possible TURP defect. Bilateral left larger than right hydroceles are incompletely imaged. Other: A percutaneous drain in the left upper quadrant surrounds a significantly decreased fluid and gas collection including on 31/2. Example 3.9 x 3.9 cm today versus on the order of 8.5 x 6.1 cm on the prior. Likely contiguous fluid surrounding the medial spleen including on 19/2. A new collection of loculated ascites within the right abdomen  and pelvis is relatively ill-defined superiorly at 6.7 x 4.2 cm on 58/2. More well-defined in the pelvis at 4.9 x 6.1 cm on 73/2. Cul-de-sac fluid collection including at 5.9 x 5.3 cm on 80/2 is relatively similar in size to on the prior exam and has a surgical drain within. Anasarca. There may be a right abdominal wall fluid collection of 1.8 x 8.1 cm on 62/2, slightly more well-defined than on the prior. Musculoskeletal: Lumbosacral spondylosis. IMPRESSION: 1. Interval percutaneous drain placement within the left upper quadrant. Small surrounding gas and fluid collection. 2. New right  abdominopelvic loculated ascites, suspicious for developing abscess or infected ascites. 3. Pelvic cul-de-sac collection is similar in size as a surgical drain within. 4. New bilateral hydroureteronephrosis, possibly related to bladder distension. 5. Persistent bilateral pleural effusions with adjacent consolidation, most likely atelectasis. 6. Subcapsular right hepatic lobe fluid collection is felt to be slightly increased. 7. Favor postoperative adynamic ileus without high-grade obstruction. 8. Bilateral hydroceles. 9. Gastric and esophageal wall thickening, suspicious for gastritis and esophagitis. 10. Possible developing tiny right abdominal wall fluid collection. Recommend attention on follow-up. Electronically Signed   By: Abigail Miyamoto M.D.   On: 03/19/2021 18:31   CT ABDOMEN PELVIS W CONTRAST  Result Date: 03/11/2021 CLINICAL DATA:  70 year old history of rectal cancer and status post low anterior resection with end colostomy. Postoperative fevers. EXAM: CT ABDOMEN AND PELVIS WITH CONTRAST TECHNIQUE: Multidetector CT imaging of the abdomen and pelvis was performed using the standard protocol following bolus administration of intravenous contrast. CONTRAST:  179m OMNIPAQUE IOHEXOL 300 MG/ML  SOLN COMPARISON:  CT abdomen and pelvis 10/31/2020 FINDINGS: Lower chest: Bilateral pleural effusions. Pleural effusions are small to moderate in size but incompletely evaluated. Compressive atelectasis in both lower lobes. The distal esophagus is distended. The nasogastric tube is coiled in the stomach and the tip actually extends back into the distal esophagus. Hepatobiliary: Normal appearance of the liver. There is low-density collection along the posterior aspect of the right hepatic lobe which could be in the subcapsular space. This collection measures 5.2 x 4.1 x 7.5 cm. No gross abnormality to the gallbladder. No significant biliary dilatation. Pancreas: Unremarkable. No pancreatic ductal dilatation or  surrounding inflammatory changes. Spleen: Normal appearance of the spleen. Adrenals/Urinary Tract: Normal appearance of the adrenal glands. Evidence for cysts in the right kidney. Negative for hydronephrosis. Urinary bladder is decompressed with a Foley catheter. Stomach/Bowel: Patient has a left colostomy with a Hartmann's pouch. There is a small amount of fluid in the residual rectum. Distal esophagus appears to be markedly distended but difficult to exclude wall thickening in the esophagus. Nasogastric tube is coiled in the stomach but the tip actually extends cephalad into the distal esophagus. Dilated fluid-filled loops of small bowel throughout the abdomen. There appears to be diffuse wall thickening involving the transverse and left colon. Possible bowel wall thickening at the cecum. In addition, there is probably wall thickening involving small bowel in the left upper abdomen. Vascular/Lymphatic: Atherosclerotic calcifications in the abdominal aorta without aneurysm. No significant lymph node enlargement in the abdomen or pelvis. Reproductive: Limited evaluation of the prostate. Large amount of fluid in the scrotum bilaterally. This finding was present on the previous examination and most compatible with bilateral hydroceles. There is a small amount of gas near the right inguinal canal and not clear if this is within the canal or the subcutaneous tissues. Other: Small to moderate amount of fluid in the left upper abdomen which may represent ascites. Small  amount of fluid in the left lower abdomen. There is a small amount of fluid in the pelvis surrounding the surgical drain. There is a right-sided surgical drain that extends into the pelvis and the tip is on the left side of the pelvis. Large amount of subcutaneous edema. Musculoskeletal: No acute bone abnormality. Scattered subcutaneous gas in the abdomen and pelvis. IMPRESSION: 1. Diffuse dilatation of small bowel and colon. In addition, there is evidence  for colonic wall thickening and suspect there is small bowel wall thickening in the left upper abdomen. Findings raise concern for colitis and enteritis. There is likely an adynamic ileus. 2. Anasarca with bilateral pleural effusions, ascites and large amount of subcutaneous edema. 3. Scattered intra-abdominal free fluid/ascites. Largest component is in the left upper abdomen and small amount of fluid in the pelvis surround the surgical drain. 4. Focal low-density fluid collection posterior to the right hepatic lobe likely in the subcapsular space. This low-density collection measures up to 7.5 cm. This could represent loculated ascites but indeterminate. 5. Distended esophagus. Nasogastric tube is coiled in the stomach but the tip is actually in the distal esophagus. Consider repositioning of the nasogastric tube. 6. Bilateral hydroceles. These results were called by telephone at the time of interpretation on 03/11/2021 at 4:11 pm to provider [facility] , who verbally acknowledged these results. Electronically Signed   By: Markus Daft M.D.   On: 03/11/2021 16:17   US RENAL  Result Date: 03/09/2021 CLINICAL DATA:  Acute kidney injury EXAM: RENAL / URINARY TRACT ULTRASOUND COMPLETE COMPARISON:  None. FINDINGS: Right Kidney: Renal measurements: 11.5 x 5.3 x 5.6 cm = volume: 176 mL. Echogenicity within normal limits. RIGHT renal cyst measures 2.4 cm. No suspicious mass or hydronephrosis. Left Kidney: Renal measurements: 11.5 x 6.5 x 5 cm = volume: 195 mL. Echogenicity within normal limits. No mass or hydronephrosis visualized. Bladder: Decompressed by Foley catheter. Other: None. IMPRESSION: No acute or significant findings.  No hydronephrosis. Electronically Signed   By: Franki Cabot M.D.   On: 03/09/2021 18:35   IR US Guide Bx Asp/Drain  Result Date: 03/12/2021 INDICATION: History of rectal cancer post LAR with end colostomy, now with postoperative abdominal distension and fevers with indeterminate fluid  collection within the left upper abdominal quadrant. Please perform ultrasound-guided aspiration and/or drainage catheter placement for infection source control purposes. EXAM: ULTRASOUND-GUIDED PERCUTANEOUS DRAINAGE CATHETER PLACEMENT COMPARISON:  CT abdomen pelvis-03/11/2021 MEDICATIONS: The patient is currently admitted to the hospital and receiving intravenous antibiotics. The antibiotics were administered within an appropriate time frame prior to the initiation of the procedure. ANESTHESIA/SEDATION: None CONTRAST:  None COMPLICATIONS: None immediate. PROCEDURE: Informed written consent was obtained from the patient's wife after a discussion of the risks, benefits and alternatives to treatment. The patient was placed supine on his ICU hospital bed and preprocedural sonographic evaluation was performed of the left upper abdominal quadrant demonstrating small to moderate-sized moderately complex fluid collection compatible with the findings seen on preceding abdominal CT image 26, series 2. the procedure was planned. A timeout was performed prior to the initiation of the procedure. The skin overlying the left upper abdominal quadrant was prepped and draped in the usual sterile fashion. The overlying soft tissues were anesthetized with 1% lidocaine with epinephrine an 18 gauge trocar needle was utilized to access the fluid collection. A small amount of purulent fluid was aspirated the decision was made to proceed with definitive percutaneous drainage catheter placement As such, a short Amplatz wire was coiled within the  collection. Appropriate position was confirmed with sonographic evaluation. Multiple ultrasound images were saved procedural documentation purposes. Next, the track was dilated ultimately allowing placement of a 12 [place] all-purpose drainage catheter. Appropriate positioning was again confirmed with sonographic evaluation and image documentation Next, approximately 160 cc of purulent fluid was  aspirated. The tube was connected to a drainage bag and sutured in place. A dressing was placed. The patient tolerated the procedure well without immediate post procedural complication. IMPRESSION: Successful CT guided placement of a 65 French all purpose drain catheter into the complex fluid collection with the left upper abdominal quadrant with aspiration of 160 mL of purulent fluid. Samples were sent to the laboratory as requested by the ordering clinical team. Electronically Signed   By: Sandi Mariscal M.D.   On: 03/12/2021 07:52   DG CHEST PORT 1 VIEW  Result Date: 03/12/2021 CLINICAL DATA:  Aspiration EXAM: PORTABLE CHEST 1 VIEW COMPARISON:  03/12/2021 FINDINGS: Single frontal view of the chest demonstrates a stable right chest wall port and right-sided PICC. Cardiac silhouette is stable. Persistent bibasilar veiling opacities unchanged since prior exam. No pneumothorax. Pigtail drain partially visualized within the left upper quadrant abdomen. IMPRESSION: 1. Stable bibasilar veiling opacities consistent with consolidation and effusion. Superimposed aspiration would be difficult to exclude. Continued radiographic follow-up is recommended. Electronically Signed   By: Randa Ngo M.D.   On: 03/12/2021 20:20   DG CHEST PORT 1 VIEW  Result Date: 03/12/2021 CLINICAL DATA:  Respiratory distress EXAM: PORTABLE CHEST 1 VIEW COMPARISON:  03/09/2021 FINDINGS: Cardiac shadow is stable. Chest wall port is again noted on the right. Right-sided PICC line is noted in the mid right atrium and could be withdrawn as clinically necessary. Bilateral pleural effusions are again seen and stable. No new focal infiltrate is noted. IMPRESSION: Stable bilateral effusions with underlying atelectatic changes. New right-sided PICC line is in the midportion of the right atrium and could be withdrawn as clinically necessary. Electronically Signed   By: Inez Catalina M.D.   On: 03/12/2021 11:59   DG CHEST PORT 1 VIEW  Result Date:  03/09/2021 CLINICAL DATA:  Increasing shortness of breath, wheezing, known rectal cancer EXAM: PORTABLE CHEST 1 VIEW COMPARISON:  03/07/2021 FINDINGS: Single frontal view of the chest demonstrates stable right chest wall port. Cardiac silhouette is stable. No change in bilateral veiling opacities consistent with consolidation and effusions. No pneumothorax. IMPRESSION: 1. Stable bibasilar consolidation and effusions. Electronically Signed   By: Randa Ngo M.D.   On: 03/09/2021 17:36   DG CHEST PORT 1 VIEW  Result Date: 03/07/2021 CLINICAL DATA:  Decreased oxygen saturation EXAM: PORTABLE CHEST 1 VIEW COMPARISON:  Chest CT October 31, 2020 FINDINGS: Port-A-Cath tip is in the superior vena cava. No appreciable pneumothorax. There are pleural effusions bilaterally with bibasilar atelectasis. Heart size and pulmonary vascular normal. No adenopathy. There is aortic atherosclerosis. No bone lesions. There are scattered areas of subcutaneous emphysema on each side. IMPRESSION: Scattered areas of subcutaneous emphysema without demonstrable pneumothorax. Port-A-Cath tip in superior vena cava. Pleural effusions bilaterally with bibasilar atelectasis. Heart size normal. Aortic Atherosclerosis (ICD10-I70.0). Electronically Signed   By: Lowella Grip III M.D.   On: 03/07/2021 08:02   DG Abd Portable 1V  Result Date: 03/08/2021 CLINICAL DATA:  Ileus. EXAM: PORTABLE ABDOMEN - 1 VIEW COMPARISON:  None. FINDINGS: There is a paucity of bowel gas limiting evaluation. A surgical drain terminates in the pelvis. Bilateral pleural effusions with underlying atelectasis. No other abnormalities. IMPRESSION: 1. There is  a surgical drain terminating in the pelvis. 2. The lack of significant bowel gas limits evaluation for ileus or obstruction. No dilated loops are seen. 3. Small bilateral pleural effusions with underlying atelectasis. Electronically Signed   By: Dorise Bullion III M.D   On: 03/08/2021 14:55   DG Abd Portable  2V  Result Date: 03/12/2021 CLINICAL DATA:  Follow-up ileus EXAM: PORTABLE ABDOMEN - 2 VIEW COMPARISON:  03/11/2021 FINDINGS: Paucity of bowel gas is noted. Left-sided end colostomy is seen. Surgical drain is noted deep in the pelvis as well as a percutaneous drain in the upper abdomen on the left. No free air is seen. Small bilateral pleural effusions are noted. IMPRESSION: Paucity of bowel gas consistent with fluid-filled loops of bowel. Electronically Signed   By: Inez Catalina M.D.   On: 03/12/2021 12:01   DG C-Arm 1-60 Min-No Report  Result Date: 03/05/2021 CLINICAL DATA:  Possible retained foreign body. EXAM: ABDOMEN - 1 VIEW; DG C-ARM 1-60 MIN-NO REPORT FLUOROSCOPY TIME:  3 seconds. COMPARISON:  None. FINDINGS: Four intraoperative fluoroscopic images were obtained of the abdomen. These demonstrate nasogastric tube tip in expected position of proximal stomach. There appears to be a surgical drain in the pelvis. There is no definite evidence of other radiopaque foreign body. IMPRESSION: Surgical drain seen in the pelvis. Nasogastric tube tip seen in expected position of proximal stomach. No other definite foreign body is noted. Electronically Signed   By: Marijo Conception M.D.   On: 03/05/2021 16:22   VAS [place] LOWER EXTREMITY VENOUS (DVT)  Result Date: 03/09/2021  Lower Venous DVT Study Patient Name:  Shane [name]  Date of Exam:   03/09/2021 Medical Rec #: [id number]      Accession #:    [id number] Date of Birth: [birth year]     Patient Gender: M Patient Age:   69Y Exam Location:  [facility] Procedure:      VAS [place] LOWER EXTREMITY VENOUS (DVT) Referring Phys: [id number] Jonnie Finner --------------------------------------------------------------------------------  Indications: Edema.  Comparison Study: no prior Performing Technologist: Abram Sander RVS  Examination Guidelines: A complete evaluation includes B-mode imaging, spectral Doppler, color Doppler, and power Doppler as needed of all accessible  portions of each vessel. Bilateral testing is considered an integral part of a complete examination. Limited examinations for reoccurring indications may be performed as noted. The reflux portion of the exam is performed with the patient in reverse Trendelenburg.  +---------+---------------+---------+-----------+----------+--------------+ RIGHT    CompressibilityPhasicitySpontaneityPropertiesThrombus Aging +---------+---------------+---------+-----------+----------+--------------+ CFV      Full           Yes      Yes                                 +---------+---------------+---------+-----------+----------+--------------+ SFJ      Full                                                        +---------+---------------+---------+-----------+----------+--------------+ FV Prox  Full                                                        +---------+---------------+---------+-----------+----------+--------------+  FV Mid   Full                                                        +---------+---------------+---------+-----------+----------+--------------+ FV DistalFull                                                        +---------+---------------+---------+-----------+----------+--------------+ PFV      Full                                                        +---------+---------------+---------+-----------+----------+--------------+ POP      Full           Yes      Yes                                 +---------+---------------+---------+-----------+----------+--------------+ PTV      Full                                                        +---------+---------------+---------+-----------+----------+--------------+ PERO     Full                                                        +---------+---------------+---------+-----------+----------+--------------+   +---------+---------------+---------+-----------+----------+--------------+ LEFT      CompressibilityPhasicitySpontaneityPropertiesThrombus Aging +---------+---------------+---------+-----------+----------+--------------+ CFV      Full           Yes      Yes                                 +---------+---------------+---------+-----------+----------+--------------+ SFJ      Full                                                        +---------+---------------+---------+-----------+----------+--------------+ FV Prox  Full                                                        +---------+---------------+---------+-----------+----------+--------------+ FV Mid   Full                                                        +---------+---------------+---------+-----------+----------+--------------+   FV DistalFull                                                        +---------+---------------+---------+-----------+----------+--------------+ PFV      Full                                                        +---------+---------------+---------+-----------+----------+--------------+ POP      Full           Yes      Yes                                 +---------+---------------+---------+-----------+----------+--------------+ PTV      Full                                                        +---------+---------------+---------+-----------+----------+--------------+ PERO     Full                                                        +---------+---------------+---------+-----------+----------+--------------+     Summary: BILATERAL: - No evidence of deep vein thrombosis seen in the lower extremities, bilaterally. - No evidence of superficial venous thrombosis in the lower extremities, bilaterally. -No evidence of popliteal cyst, bilaterally.   *See table(s) above for measurements and observations. Electronically signed by Harold Barban MD on 03/09/2021 at 4:26:21 PM.    Final    [place] EKG SITE RITE  Result Date: 03/10/2021 If Site Rite image  not attached, placement could not be confirmed due to current cardiac rhythm.   ASSESSMENT AND PLAN: 1. Rectal cancer  CT abdomen/pelvis 06/27/2020-irregular masslike thickening of the rectum, increased colonic stool, perirectal infiltration into the presacral space, tiny bibasilar pulmonary nodules  CT chest 07/12/2020-multiple small pulmonary nodules, largest 3 mm in the left apex  MRI pelvis 07/12/2020-tumor at 11.4 centimeters from the anal verge, 6.6 cm from the internal sphincter, tumor extends through the right lateral rectal wall and directly involves an adjacent loop of distal sigmoid colon with invasion of anterior peritoneal reflection and involvement of the right seminal vesicle, no lymphadenopathy. T4N0  Colonoscopy 07/17/2020-mass at 12 cm from the anal verge, completely obstructing mass at 23 cm from the anal verge, large internal hemorrhoids, biopsy confirmed adenocarcinoma  Cycle 1 FOLFOX 07/30/2020  Cycle 2 FOLFOX 08/13/2020  Cycle 3 FOLFOX 08/27/2020  Cycle 4 FOLFOX 09/09/2020  Cycle 5 FOLFOX 09/24/2020, 5-FU bolus held, oxaliplatin dose reduced secondary to neutropenia  Cycle 6 FOLFOX 10/07/2020, 5-FU bolus and full dose oxaliplatin, Neulasta  Cycle 7 FOLFOX 10/21/2020, Neulasta  CTs 10/31/2020-no change in rectal mass, no evidence of nodal metastases, unchanged nonspecific lung nodules  Cycle 8 FOLFOX 11/04/2020  Radiation/Xeloda1/19/2022-01/03/2021  03/05/2021 - LAR and end colostomy  ypT4b, ypN0 adenocarcinoma of  the rectum, 0/18 lymph nodes positive, margins uninvolved, treatment effect-partial response score 2, no loss of mismatch repair protein expression, MSS 2. Iron deficiency anemia secondary to #1 3. Hemorrhoids 4. Rectal pain secondary to hemorrhoids versus the primary rectal tumor 5. Constipation secondary to #1, improved with a stool softener and laxative regimen 6. Small indeterminate pulmonary nodules 7. Arthritis 8. 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  He continues to recover from surgery.  Force has been complicated by abscess.  He has required drain placement.  IR will be the patient again today due to new right sided fluid collection.  He remains on antibiotics.  Surgical path results discussed with the patient.  He will not need adjuvant chemotherapy postoperatively.  Our plan is for a follow-up visit and CEA in approximately 6 weeks.  He was noted to have small pulmonary nodules on prior CT.  These pulmonary nodules have remained stable.  Will follow up with an outpatient CT scan of the chest in approximately 6 months to ensure stability.  Continue postop care and pain management per general surgery.  Recommendations: 1.  We will plan for outpatient follow-up at the cancer center in approximately 6 weeks for a follow-up visit and CEA. 2.  Follow-up CT scan of the chest in approximately 6 months to ensure stability of pulmonary nodules.   LOS: 15 days   Mikey Bussing, DNP, AGPCNP-BC, AOCNP 03/20/21 Shane [name] was interviewed and examined.  He is recovering from a low anterior resection/colostomy procedure.  I reviewed the pathology report details with him.  His case was presented at the GI tumor conference on 03/19/2021.  The radial margin was noted to be "close ".  We will schedule outpatient follow-up and a repeat CEA for approximately 6 weeks.  There is no indication for adjuvant systemic therapy.  He complains of discomfort at the tongue.  I will prescribe Magic mouthwash.  I was present for greater than 50% of today's visit.  I performed medical decision making.

## 2021-03-20 NOTE — Telephone Encounter (Signed)
Scheduled appt per 5/12 sch msg - no answer and vmail full. Patient to get an updated schedule when discharged hospital

## 2021-03-20 NOTE — Progress Notes (Signed)
PHARMACY - TOTAL PARENTERAL NUTRITION CONSULT NOTE   Indication: Prolonged ileus  Patient Measurements: Weight: 83.7 kg (184 lb 8.4 oz)   Body mass index is 26.48 kg/m.   Assessment: 70 yo male with rectal cancer s/p xi robotic assisted lower anterior resection with end colostomy on 4/27 now with prolonged ileus to start TPN on 5/2.  Glucose / Insulin: on sSSI q6h--> d/c on 5/10 - CBGs continue to be at goal < 150 Electrolytes: K trending up to 5.4, phos up 5.5;  Cl remains at 110, CO2 remains slightly low at 21; other lytes wnl Renal: SCr up 2.93, BUN up 57 Hepatic: AST/ALT 38/33 improved, tbili 0.2, alkphos 104 --> per labs on 5/12 Albumin/prealbumin: alb 1.8, prealb <6.6 (5/9) Intake / Output; MIVF: I/O last 24hr net +2501.8 - drain output 140 ml  - UOP 700 mL -  Started on D51/4NS at 36ml/hr today per Md GI Imaging: GI Surgeries / Procedures:  4/27:  xi robotic assisted lower anterior resection with end colostomy on 4/27 5/6: NGT out and unable to be replaced thus far  Central access: 5/2 TPN start date: 5/2  Nutritional Goals (per RD recommendation on 5/3): 2200-2400 kcal/day, protein 90-115 g/day, and >2L/day  Goal TPN rate is 90 mL/hr (provides 114 g of protein and 2199 kcals per day)  - 5/9: starting clears - 5/10: adv to fulls   Current Nutrition:  - TPN - 5/10:  ensure surgery 237 mL BID   Plan:   Continue TPN at goal 90mL/hr at 1800  Electrolytes in TPN:   Na 50 mEq/L  Remove potassium chloride  Ca 41mEq/L  Mg 71mEq/L  Reduce Phos to 5 mmol/L  Cl:Ac - max Ac  Add standard MVI and trace elements to TPN  D51/4 NS at 75 ml/hr per Md  Monitor BMP, magnesium and phosphorus tomorrow and TPN labs on Mon/Thurs  Clee Pandit P. Legrand Como, PharmD, [place] Please utilize Amion for appropriate phone number to reach the unit pharmacist ([place]) 03/20/2021 8:45 AM

## 2021-03-20 NOTE — Progress Notes (Signed)
Per Dr. Benay Spice: Set up for OV/CEA in 6 weeks. Scheduling message sent.

## 2021-03-20 NOTE — Progress Notes (Signed)
Rectal carcinoma ([place])  Subjective: Having more abd pain this AM.  HGB stable, wbc up.    Objective: Vital signs in last 24 hours: Temp:  [98.6 F (37 C)-99.5 F (37.5 C)] 98.6 F (37 C) (05/12 0640) Pulse Rate:  [101-106] 101 (05/12 0640) Resp:  [19-22] 19 (05/12 0640) BP: (153-166)/(60-68) 153/63 (05/12 0640) SpO2:  [98 %-100 %] 100 % (05/12 0640) Weight:  [83.7 kg] 83.7 kg (05/12 0500) Last BM Date: 03/17/21  Intake/Output from previous day: 05/11 0701 - 05/12 0700 In: 3441.8 [P.O.:240; I.V.:2962; IV Piggyback:239.9] Out: 940 [Urine:700; Drains:140; Stool:100] Intake/Output this shift: No intake/output data recorded.  General appearance: alert and cooperative GI: normal findings: soft, TTP diffusely, no peritonitis Incision/Wound: clean, dry, intact JP: purulent Ostomy: stool in bag, beefy red LLQ drain: purulent output  Lab Results:  Results for orders placed or performed during the hospital encounter of 03/05/21 (from the past 24 hour(s))  Comprehensive metabolic panel     Status: Abnormal   Collection Time: 03/20/21  3:13 AM  Result Value Ref Range   Sodium 138 135 - 145 mmol/L   Potassium 5.4 (H) 3.5 - 5.1 mmol/L   Chloride 110 98 - 111 mmol/L   CO2 21 (L) 22 - 32 mmol/L   Glucose, Bld 112 (H) 70 - 99 mg/dL   BUN 57 (H) 8 - 23 mg/dL   Creatinine, Ser 2.93 (H) 0.61 - 1.24 mg/dL   Calcium 8.4 (L) 8.9 - 10.3 mg/dL   Total Protein 6.2 (L) 6.5 - 8.1 g/dL   Albumin 1.8 (L) 3.5 - 5.0 g/dL   AST 38 15 - 41 U/L   ALT 33 0 - 44 U/L   Alkaline Phosphatase 104 38 - 126 U/L   Total Bilirubin 0.2 (L) 0.3 - 1.2 mg/dL   GFR, Estimated 22 (L) >60 mL/min   Anion gap 7 5 - 15  Magnesium     Status: None   Collection Time: 03/20/21  3:13 AM  Result Value Ref Range   Magnesium 2.4 1.7 - 2.4 mg/dL  Phosphorus     Status: Abnormal   Collection Time: 03/20/21  3:13 AM  Result Value Ref Range   Phosphorus 5.5 (H) 2.5 - 4.6 mg/dL  CBC     Status: Abnormal   Collection Time:  03/20/21  3:13 AM  Result Value Ref Range   WBC 21.6 (H) 4.0 - 10.5 K/uL   RBC 2.70 (L) 4.22 - 5.81 MIL/uL   Hemoglobin 7.6 (L) 13.0 - 17.0 g/dL   HCT 24.2 (L) 39.0 - 52.0 %   MCV 89.6 80.0 - 100.0 fL   MCH 28.1 26.0 - 34.0 pg   MCHC 31.4 30.0 - 36.0 g/dL   RDW 16.8 (H) 11.5 - 15.5 %   Platelets 549 (H) 150 - 400 K/uL   nRBC 0.3 (H) 0.0 - 0.2 %  Protime-INR     Status: Abnormal   Collection Time: 03/20/21  3:13 AM  Result Value Ref Range   Prothrombin Time 15.9 (H) 11.4 - 15.2 seconds   INR 1.3 (H) 0.8 - 1.2     Studies/Results Radiology     MEDS, Scheduled . acetaminophen  1,000 mg Oral Q6H  . bacitracin   Topical BID  . chlorhexidine  15 mL Mouth Rinse BID  . Chlorhexidine Gluconate Cloth  6 each Topical Daily  . feeding supplement  237 mL Oral BID BM  . lip balm  1 application Topical BID  . mouth rinse  15 mL Mouth Rinse q12n4p  . melatonin  3 mg Oral QHS  . pantoprazole  40 mg Intravenous Q12H  . sodium chloride flush  10-40 mL Intracatheter Q12H  . sodium chloride flush  5 mL Intracatheter Q8H  . sucralfate  1 g Oral TID WC & HS     Assessment: Rectal carcinoma (HCC)  POD 12 LAR and diverting ostomy Post op abscess: IR drain in LLQ, completed course of Zosyn and Eraxis Hemorrhagic esophagitis  Plan: Cont TPN, NPO for now Acute blood loss anemia: Hgb stable today, has had IV Fe, repeat CBC in AM ID: wbc up, repeat CT abd shows new R sided fluid collection, will cont Zosyn.  IR to eval for drain  RT for pulm toilet PT for ambulation IV pain meds for now Cont Carafate, and IV PPI for esophagitis     LOS: 15 days    Rosario Adie, MD  [facility], [place]    03/20/2021 7:37 AM

## 2021-03-20 NOTE — Progress Notes (Signed)
PT Cancellation Note  Patient Details Name: Eliza Grissinger MRN: [id number] DOB: 1951/08/21   Cancelled Treatment:    Reason Eval/Treat Not Completed: Patient at procedure or test/unavailable, will check back tomorrow.   Claretha Cooper 03/20/2021, 2:47 PM [place] Pager [phone] Office [phone]'

## 2021-03-21 LAB — BASIC METABOLIC PANEL
Anion gap: 4 — ABNORMAL LOW (ref 5–15)
BUN: 36 mg/dL — ABNORMAL HIGH (ref 8–23)
CO2: 24 mmol/L (ref 22–32)
Calcium: 8.6 mg/dL — ABNORMAL LOW (ref 8.9–10.3)
Chloride: 115 mmol/L — ABNORMAL HIGH (ref 98–111)
Creatinine, Ser: 1.17 mg/dL (ref 0.61–1.24)
GFR, Estimated: >60 mL/min (ref >60)
Glucose, Bld: 112 mg/dL — ABNORMAL HIGH (ref 70–99)
Potassium: 4.6 mmol/L (ref 3.5–5.1)
Sodium: 143 mmol/L (ref 135–145)

## 2021-03-21 LAB — CBC
HCT: 22.7 % — ABNORMAL LOW (ref 39.0–52.0)
Hemoglobin: 6.9 g/dL — CL (ref 13.0–17.0)
MCH: 27.9 pg (ref 26.0–34.0)
MCHC: 30.4 g/dL (ref 30.0–36.0)
MCV: 91.9 fL (ref 80.0–100.0)
Platelets: 579 10*3/uL — ABNORMAL HIGH (ref 150–400)
RBC: 2.47 MIL/uL — ABNORMAL LOW (ref 4.22–5.81)
RDW: 17.1 % — ABNORMAL HIGH (ref 11.5–15.5)
WBC: 17.7 10*3/uL — ABNORMAL HIGH (ref 4.0–10.5)
nRBC: 0.2 % (ref 0.0–0.2)

## 2021-03-21 LAB — PREPARE RBC (CROSSMATCH)

## 2021-03-21 LAB — PHOSPHORUS: Phosphorus: 3.3 mg/dL (ref 2.5–4.6)

## 2021-03-21 LAB — MAGNESIUM: Magnesium: 2.3 mg/dL (ref 1.7–2.4)

## 2021-03-21 MED ORDER — HEPARIN SOD (PORK) LOCK FLUSH 100 UNIT/ML IV SOLN
500.0000 [IU] | INTRAVENOUS | Status: AC | PRN
  Administered 2021-03-21: 500 [IU]
  Filled 2021-03-21: qty 5

## 2021-03-21 MED ORDER — TRAVASOL 10 % IV SOLN
INTRAVENOUS | Status: AC
  Filled 2021-03-21: qty 1123.2

## 2021-03-21 MED ORDER — SODIUM CHLORIDE 0.9% IV SOLUTION: Freq: Once | INTRAVENOUS | Status: AC

## 2021-03-21 NOTE — Progress Notes (Signed)
Routine line care: TL PICC infusing. Port capped, due for needle change. Heparinized and de-accessed port as it was not in use.

## 2021-03-21 NOTE — Progress Notes (Signed)
Rectal carcinoma ([place])  Subjective: Abdominal pain has ~resolved following both perc drain placement and foley catheter placement. Denies n/v.  Objective: Vital signs in last 24 hours: Temp:  [98.1 F (36.7 C)-98.9 F (37.2 C)] 98.2 F (36.8 C) (05/13 0341) Pulse Rate:  [102-115] 103 (05/13 0341) Resp:  [18-28] 18 (05/13 0341) BP: (126-155)/(51-65) 140/59 (05/13 0341) SpO2:  [97 %-100 %] 97 % (05/13 0341) Weight:  [83.9 kg] 83.9 kg (05/13 0500) Last BM Date: 03/20/21 (colostomy)  Intake/Output from previous day: 05/12 0701 - 05/13 0700 In: 1687.6 [P.O.:50; I.V.:1302.5; IV Piggyback:335.1] Out: 6760 [Urine:6350; Drains:310; Stool:100] Intake/Output this shift: Total I/O In: -  Out: 275 [Urine:275]  General appearance: alert and cooperative GI: normal findings: soft, TTP diffusely, no peritonitis Incision/Wound: clean, dry, intact JP: purulent Ostomy: appliance with gas but no evident stool at this time LLQ drain: purulent output  Lab Results:  Results for orders placed or performed during the hospital encounter of 03/05/21 (from the past 24 hour(s))  Aerobic/Anaerobic Culture (surgical/deep wound)     Status: None (Preliminary result)   Collection Time: 03/20/21  3:23 PM   Specimen: Abscess  Result Value Ref Range   Specimen Description      ABSCESS ABDOMINAL PELVIC DRAIN Performed at [facility], [place] [address], [place], [place] [address]    Special Requests      NONE Performed at [facility], [place] [address] [place], [place] [address]    Gram Stain      ABUNDANT WBC PRESENT, PREDOMINANTLY PMN NO ORGANISMS SEEN Performed at [facility], [place] [address], [place], [place] [address]    Culture PENDING    Report Status PENDING   CBC     Status: Abnormal   Collection Time: 03/21/21  5:00 AM  Result Value Ref Range   WBC 17.7 (H) 4.0 - 10.5 K/uL   RBC 2.47 (L) 4.22 - 5.81 MIL/uL   Hemoglobin 6.9 (LL) 13.0 - 17.0 g/dL    HCT 22.7 (L) 39.0 - 52.0 %   MCV 91.9 80.0 - 100.0 fL   MCH 27.9 26.0 - 34.0 pg   MCHC 30.4 30.0 - 36.0 g/dL   RDW 17.1 (H) 11.5 - 15.5 %   Platelets 579 (H) 150 - 400 K/uL   nRBC 0.2 0.0 - 0.2 %  Basic metabolic panel     Status: Abnormal   Collection Time: 03/21/21  5:00 AM  Result Value Ref Range   Sodium 143 135 - 145 mmol/L   Potassium 4.6 3.5 - 5.1 mmol/L   Chloride 115 (H) 98 - 111 mmol/L   CO2 24 22 - 32 mmol/L   Glucose, Bld 112 (H) 70 - 99 mg/dL   BUN 36 (H) 8 - 23 mg/dL   Creatinine, Ser 1.17 0.61 - 1.24 mg/dL   Calcium 8.6 (L) 8.9 - 10.3 mg/dL   GFR, Estimated >60 >60 mL/min   Anion gap 4 (L) 5 - 15  Magnesium     Status: None   Collection Time: 03/21/21  5:00 AM  Result Value Ref Range   Magnesium 2.3 1.7 - 2.4 mg/dL  Phosphorus     Status: None   Collection Time: 03/21/21  5:00 AM  Result Value Ref Range   Phosphorus 3.3 2.5 - 4.6 mg/dL     Studies/Results Radiology     MEDS, Scheduled . sodium chloride   Intravenous Once  . acetaminophen  1,000 mg Oral Q6H  . bacitracin  Topical BID  . chlorhexidine  15 mL Mouth Rinse BID  . Chlorhexidine Gluconate Cloth  6 each Topical Daily  . feeding supplement  237 mL Oral BID BM  . lip balm  1 application Topical BID  . mouth rinse  15 mL Mouth Rinse q12n4p  . melatonin  3 mg Oral QHS  . pantoprazole  40 mg Intravenous Q12H  . sodium chloride flush  10-40 mL Intracatheter Q12H  . sodium chloride flush  5 mL Intracatheter Q8H  . sodium chloride flush  5 mL Intracatheter Q8H  . sucralfate  1 g Oral TID WC & HS     Assessment: Rectal carcinoma (HCC)  POD 12 LAR and diverting ostomy Post op abscess: IR drain in LLQ, completed course of Zosyn and Eraxis Hemorrhagic esophagitis  Plan: Cont TPN, NPO for now Acute blood loss anemia: has had IV Fe, Hgb down slightly - likely expected drift but will give 1u today as mild tachycardia and hgb <7. ID:  Zosyn.  IR drain placed in lower quadrant abscess 5/12.  WBC down-trending RT for pulm toilet PT for ambulation IV pain meds for now Cont Carafate, and IV PPI for esophagitis    LOS: 16 days   Nadeen Landau, MD [facility] Use [email or web address] to contact on call provider

## 2021-03-21 NOTE — Care Management Important Message (Signed)
Important Message  Patient Details IM Letter given to the Patient. Name: Shane [name] MRN: [id number] Date of Birth: January 03, 1951   Medicare Important Message Given:  Yes     Kerin Salen 03/21/2021, 1:14 PM

## 2021-03-21 NOTE — Progress Notes (Signed)
PT Cancellation Note  Patient Details Name: Shane [name] MRN: [id number] DOB: 1951/01/06   Cancelled Treatment:    Reason Eval/Treat Not Completed: Medical issues which prohibited therapy  In am, pt's hgb 6.9 and RN reports just gave pain meds and pt lethargic. In pm, pt getting  PRBC and is a max x2 transfer -did not want to risk IV since difficult transfer and getting blood.  Will f/u as able.   Abran Richard, PT Acute Rehab Services Pager [phone] [facility] [phone]   [name] 03/21/2021, 3:25 PM

## 2021-03-21 NOTE — Progress Notes (Signed)
PHARMACY - TOTAL PARENTERAL NUTRITION CONSULT NOTE   Indication: Prolonged ileus  Patient Measurements: Weight: 83.9 kg (184 lb 15.5 oz)   Body mass index is 26.54 kg/m.   Assessment: 70 yo male with rectal cancer s/p xi robotic assisted lower anterior resection with end colostomy on 4/27 now with prolonged ileus to start TPN on 5/2.  Glucose / Insulin: on sSSI q6h--> d/c on 5/10 - CBGs continue to be at goal < 150 Electrolytes: Na up 143; K down to 4.6 with K removed from TPN on 5/12, phos 3.3, Mag 2.3;  Cl up 115, CO2 remains slightly low at 21; other lytes wnl Renal: SCr up 2.93, BUN up 57 Hepatic: AST/ALT 38/33 improved, tbili 0.2, alkphos 104 --> per labs on 5/12 Albumin/prealbumin: alb 1.8, prealb <6.6 (5/9) Intake / Output; MIVF: I/O last 24hr net +2501.8 - drain output 140 ml  - UOP 700 mL -  Started on D51/4NS at 55ml/hr today per Md GI Imaging: GI Surgeries / Procedures:  - 4/27:  xi robotic assisted lower anterior resection with end colostomy on 4/27 - 5/6: NGT out and unable to be replaced thus far -5/12: CT guided placed of a 12 Fr drainage catheter placement into the right lower abdomen/pelvis yielding 75 cc of purulent appearing fluid  Central access: 5/2 TPN start date: 5/2  Nutritional Goals (per RD recommendation on 5/3): 2200-2400 kcal/day, protein 90-115 g/day, and >2L/day  Goal TPN rate is 90 mL/hr (provides 112 g of protein and 2199 kcals per day)  - 5/9: starting clears - 5/10: adv to fulls   Current Nutrition:  - TPN - 5/10:  ensure surgery 237 mL BID (not given on 5/12 due to NPO status)  Plan:   There is not an active TPN order for 5/12-5/13 in EPIC due to an entry error on 5/12, but patient does have TPN bag currently at 90 ml/hr   Continue TPN at goal 34mL/hr at 1800  Electrolytes in TPN:   Reduce Na to 30 mEq/L  Add K 20 mEq/L  Ca 58mEq/L  Mg 69mEq/L  Increase Phos to 10 mmol/L  Cl:Ac - max Ac  Add standard MVI and trace  elements to TPN  D5 1/4 NS at 75 ml/hr per Md  TPN labs on Mon/Thurs  Bmet, mag, phos on 5/14  Dia Sitter, PharmD, BCPS 03/21/2021 7:33 AM

## 2021-03-21 NOTE — Progress Notes (Signed)
Referring Physician(s): Gross  Supervising Physician: Aletta Edouard  Patient Status:  [facility] - In-pt  Chief Complaint:  F/U drains  Brief History:  Shane [name] is a 68 y/omaleadmitted for low anterior resection for rectal cancer who subsequently developed shock and intra-abdominal fluid collection.  He is s/p LUQ fluid collection aspiration and drain placement at the bedside with Dr. Pascal Lux on 5/3.  WBC elevated from 13.9 to 17.3 today so CCS ordered CT abd/pelvis.  CT showed= New right abdominopelvic loculated ascites, suspicious for developing abscess or infected ascites.  Images reviewed by Dr. Pascal Lux and he underwent additional drain placement yesterday.  Subjective:  Awake and alert. No complaints. States his abdominal pain is much better since placement of the additional drain.  Allergies: Nsaids  Medications: Prior to Admission medications   Medication Sig Start Date End Date Taking? Authorizing Provider  Diclofenac Sodium 2 % SOLN Place 2 g onto the skin 2 (two) times daily. Patient taking differently: Place 2 g onto the skin daily as needed (pain). 06/30/19  Yes Lyndal Pulley, DO  docusate sodium (COLACE) 100 MG capsule Take 100 mg by mouth daily as needed for mild constipation.    Yes [provider]  HYDROcodone-acetaminophen (NORCO/VICODIN) 5-325 MG tablet Take 1 tablet by mouth every 6 (six) hours as needed for moderate pain. 02/12/21  Yes Owens Shark, NP  hydrocortisone (ANUSOL-HC) 2.5 % rectal cream Place 1 application rectally 2 (two) times daily. Patient taking differently: Place 1 application rectally daily as needed for hemorrhoids or anal itching. 01/28/21  Yes Marrian Salvage, FNP  lidocaine-prilocaine (EMLA) cream Apply 1 application topically as needed. Patient taking differently: Apply 1 application topically as needed (prior to port use). 10/21/20  Yes Ladell Pier, MD  pravastatin (PRAVACHOL) 20 MG tablet TAKE 1  TABLET(20 MG) BY MOUTH DAILY Patient taking differently: Take 20 mg by mouth daily. 11/12/20  Yes Marrian Salvage, FNP  capecitabine (XELODA) 500 MG tablet TAKE 2000 MG (4 TABLETS) EVERY MORNING AND 1500 MG (3 TABLETS) EVERY EVENING FOR A TOTAL DAILY DOSE OF 3500 MG ON RADIATION DAYS ONLY. Patient not taking: No sig reported 11/04/20 11/04/21  Ladell Pier, MD  diphenoxylate-atropine (LOMOTIL) 2.5-0.025 MG tablet Take 1-2 tablets by mouth 3 (three) times daily as needed for diarrhea or loose stools (Maxiumum of 6/day). Patient not taking: No sig reported 12/25/20   Ladell Pier, MD  ondansetron (ZOFRAN) 8 MG tablet Take 1 tablet (8 mg total) by mouth every 8 (eight) hours as needed for nausea or vomiting. Start 72 hours after each IV chemotherapy treatment 07/30/20   Ladell Pier, MD  prochlorperazine (COMPAZINE) 10 MG tablet Take 1 tablet (10 mg total) by mouth every 6 (six) hours as needed for nausea. 07/30/20   Ladell Pier, MD     Vital Signs: BP (!) 128/55 (BP Location: Left Arm)   Pulse 100   Temp 98.5 F (36.9 C) (Oral)   Resp 18   Wt 83.9 kg   SpO2 96%   BMI 26.54 kg/m   Physical Exam Constitutional:      Appearance: Normal appearance.  HENT:     Head: Normocephalic and atraumatic.  Cardiovascular:     Rate and Rhythm: Normal rate.  Pulmonary:     Effort: Pulmonary effort is normal. No respiratory distress.  Abdominal:     Palpations: Abdomen is soft.     Tenderness: There is no abdominal tenderness.  Comments: New drain in place RLQ. ~140 mL output from this drain. Drainage is clear.  Surgical drain still with purulent output, 10 mL. Bulb charged again from bottom. I charged from sides and communicated with nursing staff to make sure bulb is charged from sides and not the bottom. LLQ drain in place, ~160 mL output.  Skin:    General: Skin is warm and dry.  Neurological:     General: No focal deficit present.     Mental Status: He is alert.   Psychiatric:        Mood and Affect: Mood normal.        Behavior: Behavior normal.     Imaging: CT ABDOMEN PELVIS W CONTRAST  Result Date: 03/19/2021 CLINICAL DATA:  Rectal cancer, status post low anterior resection with end colostomy. Intra-abdominal abscess. EXAM: CT ABDOMEN AND PELVIS WITH CONTRAST TECHNIQUE: Multidetector CT imaging of the abdomen and pelvis was performed using the standard protocol following bolus administration of intravenous contrast. CONTRAST:  43mL OMNIPAQUE IOHEXOL 300 MG/ML  SOLN COMPARISON:  03/11/2021 FINDINGS: Lower chest: Bibasilar airspace disease. Small bilateral pleural effusions are similar. Normal heart size. Incompletely imaged central line. Distal esophageal wall thickening. Hepatobiliary: Right hepatic lobe fluid density measures 5.0 cm on 21/2 versus 4.3 cm on the prior exam. New since a CT of 10/31/2020. Slightly more masslike today, favoring subcapsular fluid. Compressed gallbladder without surrounding edema or biliary duct dilatation. Pancreas: Normal, without mass or ductal dilatation. Spleen: Normal in size, without focal abnormality. Adrenals/Urinary Tract: Normal adrenal glands. Bilateral mild hydroureteronephrosis, new. Decreased contrast excretion into the kidneys. Mild bladder distension. Air within the urinary bladder. Stomach/Bowel: The stomach is mildly thick walled on 23/2. Nasogastric tube no longer identified. End colostomy.  Decompressed colon. Jejunal loops measure up to 3.4 cm, without focal transition point. Vascular/Lymphatic: Aortic atherosclerosis. No abdominopelvic adenopathy. Reproductive: Possible TURP defect. Bilateral left larger than right hydroceles are incompletely imaged. Other: A percutaneous drain in the left upper quadrant surrounds a significantly decreased fluid and gas collection including on 31/2. Example 3.9 x 3.9 cm today versus on the order of 8.5 x 6.1 cm on the prior. Likely contiguous fluid surrounding the medial spleen  including on 19/2. A new collection of loculated ascites within the right abdomen and pelvis is relatively ill-defined superiorly at 6.7 x 4.2 cm on 58/2. More well-defined in the pelvis at 4.9 x 6.1 cm on 73/2. Cul-de-sac fluid collection including at 5.9 x 5.3 cm on 80/2 is relatively similar in size to on the prior exam and has a surgical drain within. Anasarca. There may be a right abdominal wall fluid collection of 1.8 x 8.1 cm on 62/2, slightly more well-defined than on the prior. Musculoskeletal: Lumbosacral spondylosis. IMPRESSION: 1. Interval percutaneous drain placement within the left upper quadrant. Small surrounding gas and fluid collection. 2. New right abdominopelvic loculated ascites, suspicious for developing abscess or infected ascites. 3. Pelvic cul-de-sac collection is similar in size as a surgical drain within. 4. New bilateral hydroureteronephrosis, possibly related to bladder distension. 5. Persistent bilateral pleural effusions with adjacent consolidation, most likely atelectasis. 6. Subcapsular right hepatic lobe fluid collection is felt to be slightly increased. 7. Favor postoperative adynamic ileus without high-grade obstruction. 8. Bilateral hydroceles. 9. Gastric and esophageal wall thickening, suspicious for gastritis and esophagitis. 10. Possible developing tiny right abdominal wall fluid collection. Recommend attention on follow-up. Electronically Signed   By: Abigail Miyamoto M.D.   On: 03/19/2021 18:31   CT IMAGE GUIDED DRAINAGE  BY PERCUTANEOUS CATHETER  Result Date: 03/20/2021 INDICATION: History of breast cancer, post LAR with end colostomy complicated by development of postoperative abscesses, post ultrasound-guided placement of left upper quadrant percutaneous drainage catheter on 03/12/2021. Given recurrent fevers, CT scan the abdomen pelvis performed on 03/19/2021 demonstrating a new collection within the right pericolic gutter extending to the level of the pelvis. Request  made for placement of an additional percutaneous drainage catheter for infection source control purposes. EXAM: CT IMAGE GUIDED DRAINAGE BY PERCUTANEOUS CATHETER COMPARISON:  CT abdomen pelvis-03/19/2021; 03/11/2021; ultrasound-guided left upper abdominal quadrant percutaneous drainage catheter placement-03/11/2021 MEDICATIONS: The patient is currently admitted to the hospital and receiving intravenous antibiotics. The antibiotics were administered within an appropriate time frame prior to the initiation of the procedure. ANESTHESIA/SEDATION: Moderate (conscious) sedation was employed during this procedure. A total of Versed 2 mg and Fentanyl 100 mcg was administered intravenously. Moderate Sedation Time: 14 minutes. The patient's level of consciousness and vital signs were monitored continuously by radiology nursing throughout the procedure under my direct supervision. CONTRAST:  None COMPLICATIONS: None immediate. PROCEDURE: Informed written consent was obtained from the patient after a discussion of the risks, benefits and alternatives to treatment. The patient was placed supine on the CT gantry and a pre procedural CT was performed re-demonstrating the known abscess/fluid collection within the right pericolic gutter with dominant component within the right lower quadrant/pelvis measuring approximately 7.7 x 5.0 cm (image 88, series 2). The procedure was planned. A timeout was performed prior to the initiation of the procedure. The pelvis skin overlying the ventral aspect of the right lower abdomen/pelvis was prepped and draped in the usual sterile fashion. The overlying soft tissues were anesthetized with 1% lidocaine with epinephrine. Appropriate trajectory was planned with the use of a 22 gauge spinal needle. An 18 gauge trocar needle was advanced into the abscess/fluid collection and a short Amplatz super stiff wire was coiled within the collection. Appropriate positioning was confirmed with a limited CT  scan. The tract was serially dilated allowing placement of a 12 [place] all-purpose drainage catheter. Appropriate positioning was confirmed with a limited postprocedural CT scan. Approximately 75 ml of purulent fluid was aspirated. The tube was connected to a drainage bag and sutured in place. A dressing was applied. The patient tolerated the procedure well without immediate post procedural complication. IMPRESSION: Successful CT guided placement of a 77 French all purpose drain catheter into the right lower abdominal quadrant/pelvic abscess with aspiration of 75 mL of purulent fluid. Samples were sent to the laboratory as requested by the ordering clinical team. Electronically Signed   By: Sandi Mariscal M.D.   On: 03/20/2021 16:25    Labs:  CBC: Recent Labs    03/18/21 0400 03/19/21 0354 03/20/21 0313 03/21/21 0500  WBC 13.9* 17.3* 21.6* 17.7*  HGB 7.5* 7.6* 7.6* 6.9*  HCT 24.1* 24.5* 24.2* 22.7*  PLT 356 472* 549* 579*    COAGS: Recent Labs    03/20/21 0313  INR 1.3*    BMP: Recent Labs    07/04/20 0845 07/15/20 0048 08/13/20 0906 03/18/21 0400 03/19/21 0354 03/20/21 0313 03/21/21 0500  NA 138 136   < > 139 136 138 143  K 3.8 3.7   < > 4.4 4.8 5.4* 4.6  CL 108 106   < > 114* 110 110 115*  CO2 25 21*   < > 22 21* 21* 24  GLUCOSE 105* 100*   < > 124* 132* 112* 112*  BUN 12 21   < >  24* 34* 57* 36*  CALCIUM 9.9 9.2   < > 8.1* 8.3* 8.4* 8.6*  CREATININE 0.96 0.94   < > 0.74 1.36* 2.93* 1.17  GFRNONAA >60 >60   < > >60 56* 22* >60  GFRAA >60 >60  --   --   --   --   --    < > = values in this interval not displayed.    LIVER FUNCTION TESTS: Recent Labs    03/13/21 0517 03/15/21 0133 03/17/21 0018 03/20/21 0313  BILITOT 0.7 0.7 0.6 0.2*  AST 27 53* 44* 38  ALT 19 40 36 33  ALKPHOS 71 84 78 104  PROT 5.4* 5.8* 6.2* 6.2*  ALBUMIN 2.1* 2.0* 2.0* 1.8*    Assessment and Plan:  Low anterior resection for rectal cancer.  S/p LUQ fluid collection aspiration and  drain placement at the bedside with Dr. Pascal Lux on 5/3.  WBC elevated from 13.9 to 17.3 today so CCS ordered CT abd/pelvis.  CT showed= New right abdominopelvic loculated ascites, suspicious for developing abscess or infected ascites.  S/P additional RLQ drain yesterday by Dr. Pascal Lux.  Communicated with nursing staff to make sure bulb on surgical JP is charged from sides and not the bottom.  Continue with flushes as ordered.  Recommend repeat CT when output is <10-15 mL per day.   Electronically Signed: Murrell Redden, PA-C 03/21/2021, 11:23 AM    I spent a total of 15 Minutes at the the patient's bedside AND on the patient's hospital floor or unit, greater than 50% of which was counseling/coordinating care for f/u drain  Placement.

## 2021-03-21 NOTE — Plan of Care (Signed)
  Problem: Fluid Volume: Goal: Ability to achieve a balanced intake and output will improve Outcome: Progressing   Problem: Education: Goal: Knowledge of General Education information will improve Description: Including pain rating scale, medication(s)/side effects and non-pharmacologic comfort measures Outcome: Progressing   Problem: Coping: Goal: Level of anxiety will decrease Outcome: Progressing   Problem: Safety: Goal: Ability to remain free from injury will improve Outcome: Progressing

## 2021-03-22 LAB — CBC
HCT: 25.2 % — ABNORMAL LOW (ref 39.0–52.0)
Hemoglobin: 7.8 g/dL — ABNORMAL LOW (ref 13.0–17.0)
MCH: 27.9 pg (ref 26.0–34.0)
MCHC: 31 g/dL (ref 30.0–36.0)
MCV: 90 fL (ref 80.0–100.0)
Platelets: 595 10*3/uL — ABNORMAL HIGH (ref 150–400)
RBC: 2.8 MIL/uL — ABNORMAL LOW (ref 4.22–5.81)
RDW: 16.8 % — ABNORMAL HIGH (ref 11.5–15.5)
WBC: 16.1 10*3/uL — ABNORMAL HIGH (ref 4.0–10.5)
nRBC: 0.1 % (ref 0.0–0.2)

## 2021-03-22 LAB — BASIC METABOLIC PANEL
Anion gap: 6 (ref 5–15)
BUN: 25 mg/dL — ABNORMAL HIGH (ref 8–23)
CO2: 22 mmol/L (ref 22–32)
Calcium: 8.6 mg/dL — ABNORMAL LOW (ref 8.9–10.3)
Chloride: 108 mmol/L (ref 98–111)
Creatinine, Ser: 0.74 mg/dL (ref 0.61–1.24)
GFR, Estimated: >60 mL/min (ref >60)
Glucose, Bld: 110 mg/dL — ABNORMAL HIGH (ref 70–99)
Potassium: 4 mmol/L (ref 3.5–5.1)
Sodium: 136 mmol/L (ref 135–145)

## 2021-03-22 LAB — MAGNESIUM: Magnesium: 2 mg/dL (ref 1.7–2.4)

## 2021-03-22 LAB — PHOSPHORUS: Phosphorus: 3.2 mg/dL (ref 2.5–4.6)

## 2021-03-22 MED ORDER — TRAVASOL 10 % IV SOLN
INTRAVENOUS | Status: AC
  Filled 2021-03-22: qty 1123.2

## 2021-03-22 NOTE — Progress Notes (Signed)
8 Days Post-Op   Subjective/Chief Complaint: Still with moderate abdominal pain  No nausea or bloating   Objective: Vital signs in last 24 hours: Temp:  [97.5 F (36.4 C)-99.4 F (37.4 C)] 98.3 F (36.8 C) (05/14 0452) Pulse Rate:  [88-100] 93 (05/14 0812) Resp:  [18-22] 22 (05/13 2226) BP: (106-144)/(43-65) 144/65 (05/14 0812) SpO2:  [95 %-98 %] 97 % (05/14 0452) Weight:  [82.9 kg] 82.9 kg (05/14 0236) Last BM Date: 03/20/21 (colostomy)  Intake/Output from previous day: 05/13 0701 - 05/14 0700 In: 2952.8 [I.V.:2416.8; Blood:330; IV NIOEVOJJK:093] Out: 2650 [Urine:2475; Drains:175] Intake/Output this shift: No intake/output data recorded.  Exam: Awake and alert Abdomen is soft and non-distended, ostomy pink, nothing in the bag Drain purulent  Lab Results:  Recent Labs    03/21/21 0500 03/22/21 0416  WBC 17.7* 16.1*  HGB 6.9* 7.8*  HCT 22.7* 25.2*  PLT 579* 595*   BMET Recent Labs    03/21/21 0500 03/22/21 0416  NA 143 136  K 4.6 4.0  CL 115* 108  CO2 24 22  GLUCOSE 112* 110*  BUN 36* 25*  CREATININE 1.17 0.74  CALCIUM 8.6* 8.6*   PT/INR Recent Labs    03/20/21 0313  LABPROT 15.9*  INR 1.3*   ABG No results for input(s): PHART, HCO3 in the last 72 hours.  Invalid input(s): PCO2, PO2  Studies/Results: CT IMAGE GUIDED DRAINAGE BY PERCUTANEOUS CATHETER  Result Date: 03/20/2021 INDICATION: History of breast cancer, post LAR with end colostomy complicated by development of postoperative abscesses, post ultrasound-guided placement of left upper quadrant percutaneous drainage catheter on 03/12/2021. Given recurrent fevers, CT scan the abdomen pelvis performed on 03/19/2021 demonstrating a new collection within the right pericolic gutter extending to the level of the pelvis. Request made for placement of an additional percutaneous drainage catheter for infection source control purposes. EXAM: CT IMAGE GUIDED DRAINAGE BY PERCUTANEOUS CATHETER COMPARISON:   CT abdomen pelvis-03/19/2021; 03/11/2021; ultrasound-guided left upper abdominal quadrant percutaneous drainage catheter placement-03/11/2021 MEDICATIONS: The patient is currently admitted to the hospital and receiving intravenous antibiotics. The antibiotics were administered within an appropriate time frame prior to the initiation of the procedure. ANESTHESIA/SEDATION: Moderate (conscious) sedation was employed during this procedure. A total of Versed 2 mg and Fentanyl 100 mcg was administered intravenously. Moderate Sedation Time: 14 minutes. The patient's level of consciousness and vital signs were monitored continuously by radiology nursing throughout the procedure under my direct supervision. CONTRAST:  None COMPLICATIONS: None immediate. PROCEDURE: Informed written consent was obtained from the patient after a discussion of the risks, benefits and alternatives to treatment. The patient was placed supine on the CT gantry and a pre procedural CT was performed re-demonstrating the known abscess/fluid collection within the right pericolic gutter with dominant component within the right lower quadrant/pelvis measuring approximately 7.7 x 5.0 cm (image 88, series 2). The procedure was planned. A timeout was performed prior to the initiation of the procedure. The pelvis skin overlying the ventral aspect of the right lower abdomen/pelvis was prepped and draped in the usual sterile fashion. The overlying soft tissues were anesthetized with 1% lidocaine with epinephrine. Appropriate trajectory was planned with the use of a 22 gauge spinal needle. An 18 gauge trocar needle was advanced into the abscess/fluid collection and a short Amplatz super stiff wire was coiled within the collection. Appropriate positioning was confirmed with a limited CT scan. The tract was serially dilated allowing placement of a 12 [place] all-purpose drainage catheter. Appropriate positioning was confirmed with a  limited postprocedural CT scan.  Approximately 75 ml of purulent fluid was aspirated. The tube was connected to a drainage bag and sutured in place. A dressing was applied. The patient tolerated the procedure well without immediate post procedural complication. IMPRESSION: Successful CT guided placement of a 73 French all purpose drain catheter into the right lower abdominal quadrant/pelvic abscess with aspiration of 75 mL of purulent fluid. Samples were sent to the laboratory as requested by the ordering clinical team. Electronically Signed   By: Sandi Mariscal M.D.   On: 03/20/2021 16:25    Anti-infectives: Anti-infectives (From admission, onward)   Start     Dose/Rate Route Frequency Ordered Stop   03/19/21 1000  piperacillin-tazobactam (ZOSYN) IVPB 3.375 g        3.375 g 12.5 mL/hr over 240 Minutes Intravenous Every 8 hours 03/19/21 0855     03/13/21 1400  piperacillin-tazobactam (ZOSYN) IVPB 3.375 g        3.375 g 12.5 mL/hr over 240 Minutes Intravenous Every 8 hours 03/13/21 1259 03/16/21 2200   03/12/21 1000  anidulafungin (ERAXIS) 100 mg in sodium chloride 0.9 % 100 mL IVPB       "Followed by" Linked Group Details   100 mg 78 mL/hr over 100 Minutes Intravenous Every 24 hours 03/11/21 0941 03/16/21 1050   03/11/21 1030  anidulafungin (ERAXIS) 100 mg in sodium chloride 0.9 % 100 mL IVPB  Status:  Discontinued        100 mg 78 mL/hr over 100 Minutes Intravenous Every 24 hours 03/11/21 0937 03/11/21 0941   03/11/21 1030  anidulafungin (ERAXIS) 200 mg in sodium chloride 0.9 % 200 mL IVPB       "Followed by" Linked Group Details   200 mg 78 mL/hr over 200 Minutes Intravenous  Once 03/11/21 0941 03/11/21 1540   03/08/21 0030  piperacillin-tazobactam (ZOSYN) IVPB 3.375 g        3.375 g 12.5 mL/hr over 240 Minutes Intravenous Every 8 hours 03/08/21 0016 03/12/21 2204   03/05/21 2200  cefoTEtan (CEFOTAN) 2 g in sodium chloride 0.9 % 100 mL IVPB        2 g 200 mL/hr over 30 Minutes Intravenous Every 12 hours 03/05/21 1841  03/05/21 2258   03/05/21 0730  cefoTEtan (CEFOTAN) 2 g in sodium chloride 0.9 % 100 mL IVPB        2 g 200 mL/hr over 30 Minutes Intravenous On call to O.R. 03/05/21 0729 03/05/21 0926      Assessment/Plan: Rectal carcinoma (HCC)  POD 12 LAR and diverting ostomy Post op abscess: IR drain in LLQ, completed course of Zosyn and Eraxis Hemorrhagic esophagitis  Will try clear liquids today Continue TPN Iv antibiotics, WBC down more today Hbg up after 1 unit PRBC  Coralie Keens 03/22/2021

## 2021-03-22 NOTE — Progress Notes (Signed)
PHARMACY - TOTAL PARENTERAL NUTRITION CONSULT NOTE   Indication: Prolonged ileus  Patient Measurements: Weight: 82.9 kg (182 lb 12.2 oz)   Body mass index is 26.22 kg/m.   Assessment: 70 yo male with rectal cancer s/p xi robotic assisted lower anterior resection with end colostomy on 4/27 now with prolonged ileus to start TPN on 5/2.  Glucose / Insulin: on sSSI q6h--> d/c on 5/10 - CBGs continue to be at goal < 150 from bmet Electrolytes: Na 136; K 4, phos 3.2, Mag 2;  Cl and CO2 wnl; other lytes wnl Renal: SCr down to 0.74, BUN 25 Hepatic: AST/ALT 38/33 improved, tbili 0.2, alkphos 104 --> per labs on 5/12 Albumin/prealbumin: alb 1.8, prealb <6.6 (5/9) Intake / Output; MIVF:  - I/O last 24hr net +303 ml - drain output 175 ml  - UOP 2475 mL - D51/4NS at 22ml/hr per Md GI Imaging: GI Surgeries / Procedures:  - 4/27:  xi robotic assisted lower anterior resection with end colostomy on 4/27 - 5/6: NGT out and unable to be replaced thus far -5/12: CT guided placed of a 12 Fr drainage catheter placement into the right lower abdomen/pelvis yielding 75 cc of purulent appearing fluid  Central access: 5/2 TPN start date: 5/2  Nutritional Goals (per RD recommendation on 5/3): 2200-2400 kcal/day, protein 90-115 g/day, and >2L/day  Goal TPN rate is 90 mL/hr (provides 112 g of protein and 2199 kcals per day)  - 5/9: starting clears - 5/10: adv to fulls   Current Nutrition:  - TPN - 5/10:  ensure surgery 237 mL BID (not given on 5/12 due to NPO status)  Plan:   Continue TPN at goal 27mL/hr at 1800  Electrolytes in TPN:   increase Na to 40 mEq/L  increase K 30 mEq/L  Ca 40mEq/L  Mg 41mEq/L  Continue  Phos to 10 mmol/L  Cl:Ac - max Ac  Add standard MVI and trace elements to TPN  D5 1/4 NS at 75 ml/hr per Md  TPN labs on Mon/Thurs  Bmet, mag, phos on 5/15  Dia Sitter, PharmD, BCPS 03/22/2021 8:19 AM

## 2021-03-23 LAB — BASIC METABOLIC PANEL
Anion gap: 8 (ref 5–15)
BUN: 19 mg/dL (ref 8–23)
CO2: 22 mmol/L (ref 22–32)
Calcium: 8.8 mg/dL — ABNORMAL LOW (ref 8.9–10.3)
Chloride: 107 mmol/L (ref 98–111)
Creatinine, Ser: 0.66 mg/dL (ref 0.61–1.24)
GFR, Estimated: >60 mL/min (ref >60)
Glucose, Bld: 123 mg/dL — ABNORMAL HIGH (ref 70–99)
Potassium: 4.2 mmol/L (ref 3.5–5.1)
Sodium: 137 mmol/L (ref 135–145)

## 2021-03-23 LAB — CBC
HCT: 26.8 % — ABNORMAL LOW (ref 39.0–52.0)
Hemoglobin: 8.5 g/dL — ABNORMAL LOW (ref 13.0–17.0)
MCH: 28.2 pg (ref 26.0–34.0)
MCHC: 31.7 g/dL (ref 30.0–36.0)
MCV: 89 fL (ref 80.0–100.0)
Platelets: 668 10*3/uL — ABNORMAL HIGH (ref 150–400)
RBC: 3.01 MIL/uL — ABNORMAL LOW (ref 4.22–5.81)
RDW: 16.7 % — ABNORMAL HIGH (ref 11.5–15.5)
WBC: 17.4 10*3/uL — ABNORMAL HIGH (ref 4.0–10.5)
nRBC: 0.1 % (ref 0.0–0.2)

## 2021-03-23 LAB — PHOSPHORUS: Phosphorus: 3.2 mg/dL (ref 2.5–4.6)

## 2021-03-23 LAB — MAGNESIUM: Magnesium: 1.9 mg/dL (ref 1.7–2.4)

## 2021-03-23 MED ORDER — TRAVASOL 10 % IV SOLN
INTRAVENOUS | Status: AC
  Filled 2021-03-23: qty 1123.2

## 2021-03-23 NOTE — Progress Notes (Signed)
9 Days Post-Op   Subjective/Chief Complaint: No new changes Tolerated what clears he did take in without nausea or bloating   Objective: Vital signs in last 24 hours: Temp:  [98.5 F (36.9 C)-98.7 F (37.1 C)] 98.7 F (37.1 C) (05/15 0410) Pulse Rate:  [93-104] 104 (05/15 0410) Resp:  [18-20] 18 (05/14 2222) BP: (128-158)/(55-65) 158/65 (05/15 0410) SpO2:  [96 %-100 %] 100 % (05/15 0410) Weight:  [85.9 kg] 85.9 kg (05/15 0633) Last BM Date: 03/20/21 (colostomy)  Intake/Output from previous day: 05/14 0701 - 05/15 0700 In: 4092 [P.O.:60; I.V.:3860.4; IV Piggyback:171.6] Out: 2705 [Urine:2550; Drains:155] Intake/Output this shift: No intake/output data recorded.  Exam: Awake and alert Abdomen soft, ostomy pink with a small amount of liquids stool in the bag Drain more serous   Lab Results:  Recent Labs    03/22/21 0416 03/23/21 0350  WBC 16.1* 17.4*  HGB 7.8* 8.5*  HCT 25.2* 26.8*  PLT 595* 668*   BMET Recent Labs    03/22/21 0416 03/23/21 0350  NA 136 137  K 4.0 4.2  CL 108 107  CO2 22 22  GLUCOSE 110* 123*  BUN 25* 19  CREATININE 0.74 0.66  CALCIUM 8.6* 8.8*   PT/INR No results for input(s): LABPROT, INR in the last 72 hours. ABG No results for input(s): PHART, HCO3 in the last 72 hours.  Invalid input(s): PCO2, PO2  Studies/Results: No results found.  Anti-infectives: Anti-infectives (From admission, onward)   Start     Dose/Rate Route Frequency Ordered Stop   03/19/21 1000  piperacillin-tazobactam (ZOSYN) IVPB 3.375 g        3.375 g 12.5 mL/hr over 240 Minutes Intravenous Every 8 hours 03/19/21 0855     03/13/21 1400  piperacillin-tazobactam (ZOSYN) IVPB 3.375 g        3.375 g 12.5 mL/hr over 240 Minutes Intravenous Every 8 hours 03/13/21 1259 03/16/21 2200   03/12/21 1000  anidulafungin (ERAXIS) 100 mg in sodium chloride 0.9 % 100 mL IVPB       "Followed by" Linked Group Details   100 mg 78 mL/hr over 100 Minutes Intravenous Every 24  hours 03/11/21 0941 03/16/21 1050   03/11/21 1030  anidulafungin (ERAXIS) 100 mg in sodium chloride 0.9 % 100 mL IVPB  Status:  Discontinued        100 mg 78 mL/hr over 100 Minutes Intravenous Every 24 hours 03/11/21 0937 03/11/21 0941   03/11/21 1030  anidulafungin (ERAXIS) 200 mg in sodium chloride 0.9 % 200 mL IVPB       "Followed by" Linked Group Details   200 mg 78 mL/hr over 200 Minutes Intravenous  Once 03/11/21 0941 03/11/21 1540   03/08/21 0030  piperacillin-tazobactam (ZOSYN) IVPB 3.375 g        3.375 g 12.5 mL/hr over 240 Minutes Intravenous Every 8 hours 03/08/21 0016 03/12/21 2204   03/05/21 2200  cefoTEtan (CEFOTAN) 2 g in sodium chloride 0.9 % 100 mL IVPB        2 g 200 mL/hr over 30 Minutes Intravenous Every 12 hours 03/05/21 1841 03/05/21 2258   03/05/21 0730  cefoTEtan (CEFOTAN) 2 g in sodium chloride 0.9 % 100 mL IVPB        2 g 200 mL/hr over 30 Minutes Intravenous On call to O.R. 03/05/21 0729 03/05/21 0926      Assessment/Plan: Rectal carcinoma (HCC)POD 18 LAR and diverting ostomy Post op abscess: IR drain in LLQ, completed course of Zosyn and Eraxis Hemorrhagic esophagitis  LOS: 18 days   Continue TNA and antibiotics, WBC still elevated Keep on clears hgb stable  Shane [name] 03/23/2021

## 2021-03-23 NOTE — Progress Notes (Signed)
PHARMACY - TOTAL PARENTERAL NUTRITION CONSULT NOTE   Indication: Prolonged ileus  Patient Measurements: Weight: 85.9 kg (189 lb 6 oz)   Body mass index is 27.17 kg/m.   Assessment: 70 yo male with rectal cancer s/p xi robotic assisted lower anterior resection with end colostomy on 4/27 now with prolonged ileus to start TPN on 5/2.  Glucose / Insulin: on sSSI q6h--> d/c on 5/10 - CBGs continue to be at goal < 150 from bmet Electrolytes: Na 137, Mag 1.9 ; other lytes wnl Renal: SCr 0.66, BUN 19 Hepatic: AST/ALT 38/33 improved, tbili 0.2, alkphos 104 --> per labs on 5/12 Albumin/prealbumin: alb 1.8, prealb <6.6 (5/9) Intake / Output; MIVF:  - I/O last 24hr net +1387 ml - drain output 155 ml  - UOP 2550 mL - D5 1/4NS at 69ml/hr per Md GI Imaging: - 5/11: New right abdominopelvic loculated ascites, suspicious for developing abscess or infected ascites GI Surgeries / Procedures:  - 4/27:  xi robotic assisted lower anterior resection with end colostomy on 4/27 - 5/6: NGT out and unable to be replaced thus far -5/12: CT guided placed of a 12 Fr drainage catheter placement into the right lower abdomen/pelvis yielding 75 cc of purulent appearing fluid  Central access: 5/2 TPN start date: 5/2  Nutritional Goals (per RD recommendation on 5/3): 2200-2400 kcal/day, protein 90-115 g/day, and >2L/day  Goal TPN rate is 90 mL/hr (provides 112 g of protein and 2199 kcals per day)  - 5/9: clears  Current Nutrition:  - TPN - 5/10:  ensure surgery 237 mL BID (pt's refusing)  Plan:   Continue TPN at goal 27mL/hr at 1800  Electrolytes in TPN:   continue Na 40 mEq/L  Continue K 30 mEq/L  Ca 68mEq/L  Mg 26mEq/L  Continue Phos to 10 mmol/L  Cl:Ac - max Ac  Add standard MVI and trace elements to TPN  D5 1/4 NS at 75 ml/hr per Md  TPN labs on Mon/Thurs  Dia Sitter, PharmD, BCPS 03/23/2021 9:24 AM

## 2021-03-24 LAB — COMPREHENSIVE METABOLIC PANEL
ALT: 30 U/L (ref 0–44)
AST: 29 U/L (ref 15–41)
Albumin: 2 g/dL — ABNORMAL LOW (ref 3.5–5.0)
Alkaline Phosphatase: 130 U/L — ABNORMAL HIGH (ref 38–126)
Anion gap: 7 (ref 5–15)
BUN: 17 mg/dL (ref 8–23)
CO2: 23 mmol/L (ref 22–32)
Calcium: 8.7 mg/dL — ABNORMAL LOW (ref 8.9–10.3)
Chloride: 103 mmol/L (ref 98–111)
Creatinine, Ser: 0.71 mg/dL (ref 0.61–1.24)
GFR, Estimated: >60 mL/min (ref >60)
Glucose, Bld: 126 mg/dL — ABNORMAL HIGH (ref 70–99)
Potassium: 3.9 mmol/L (ref 3.5–5.1)
Sodium: 133 mmol/L — ABNORMAL LOW (ref 135–145)
Total Bilirubin: 0.1 mg/dL — ABNORMAL LOW (ref 0.3–1.2)
Total Protein: 6.9 g/dL (ref 6.5–8.1)

## 2021-03-24 LAB — PREALBUMIN: Prealbumin: 8.8 mg/dL — ABNORMAL LOW (ref 18–38)

## 2021-03-24 LAB — CBC
HCT: 26.4 % — ABNORMAL LOW (ref 39.0–52.0)
Hemoglobin: 8.4 g/dL — ABNORMAL LOW (ref 13.0–17.0)
MCH: 27.9 pg (ref 26.0–34.0)
MCHC: 31.8 g/dL (ref 30.0–36.0)
MCV: 87.7 fL (ref 80.0–100.0)
Platelets: 689 10*3/uL — ABNORMAL HIGH (ref 150–400)
RBC: 3.01 MIL/uL — ABNORMAL LOW (ref 4.22–5.81)
RDW: 16.8 % — ABNORMAL HIGH (ref 11.5–15.5)
WBC: 17.8 10*3/uL — ABNORMAL HIGH (ref 4.0–10.5)
nRBC: 0.2 % (ref 0.0–0.2)

## 2021-03-24 LAB — TYPE AND SCREEN
ABO/RH(D): O POS
Antibody Screen: NEGATIVE
Unit division: 0

## 2021-03-24 LAB — BPAM RBC
Blood Product Expiration Date: 202205212359
ISSUE DATE / TIME: 202205131355
Unit Type and Rh: 5100

## 2021-03-24 LAB — PHOSPHORUS: Phosphorus: 2.6 mg/dL (ref 2.5–4.6)

## 2021-03-24 LAB — MAGNESIUM: Magnesium: 1.7 mg/dL (ref 1.7–2.4)

## 2021-03-24 LAB — TRIGLYCERIDES: Triglycerides: 52 mg/dL (ref <150)

## 2021-03-24 MED ORDER — MAGNESIUM SULFATE 2 GM/50ML IV SOLN
2.0000 g | Freq: Once | INTRAVENOUS | Status: AC
  Administered 2021-03-24: 2 g via INTRAVENOUS
  Filled 2021-03-24: qty 50

## 2021-03-24 MED ORDER — HYDROMORPHONE HCL 1 MG/ML IJ SOLN
1.0000 mg | INTRAMUSCULAR | Status: DC | PRN
  Administered 2021-03-24 – 2021-03-27 (×3): 1 mg via INTRAVENOUS
  Administered 2021-03-27 (×2): 2 mg via INTRAVENOUS
  Filled 2021-03-24 (×3): qty 1
  Filled 2021-03-24 (×2): qty 2

## 2021-03-24 MED ORDER — TRAVASOL 10 % IV SOLN
INTRAVENOUS | Status: AC
  Filled 2021-03-24: qty 1123.2

## 2021-03-24 NOTE — Progress Notes (Signed)
Physical Therapy Treatment Patient Details Name: Shane [name] MRN: [id number] DOB: 12-Oct-1951 Today's Date: 03/24/2021    History of Present Illness The patient is a 70 year old male who presents with colorectal cancer, in  ED 03/05/21 with complaints of hemorrhoids and rectal bleeding, anemia. CT showed a large rectal mass. S/P ROBOTIC ASSISTED LOWER ANTERIOR RESECTION WITH END COLOSTOMY  MOBILIZATION OF THE SPLENIC FLEXURE  on 03/05/21, post op tachycardia and hypotension and abscess-drains placed.    PT Comments    Progressing with mobility. Continues to require +2 for safe mobility. Continue to recommend ST rehab at [facility].    Follow Up Recommendations  SNF     Equipment Recommendations  Rolling walker with 5" wheels    Recommendations for Other Services       Precautions / Restrictions Precautions Precautions: Fall Precaution Comments: colostomy, scrotal edema, B drains Restrictions Weight Bearing Restrictions: No    Mobility  Bed Mobility Overal bed mobility: Needs Assistance Bed Mobility: Supine to Sit     Supine to sit: Min assist;HOB elevated     General bed mobility comments: Partial logroll. Increased time. Pt relied on bedrail.    Transfers Overall transfer level: Needs assistance Equipment used: Rolling walker (2 wheeled) Transfers: Sit to/from Stand Sit to Stand: Mod assist;+2 physical assistance;+2 safety/equipment;From elevated surface         General transfer comment: Assist to power up, stabilize, control descent. Cues for safety, technique, hand placement. Increased time.  Ambulation/Gait Ambulation/Gait assistance: Min assist;+2 physical assistance;+2 safety/equipment Gait Distance (Feet): 20 Feet Assistive device: Rolling walker (2 wheeled) Gait Pattern/deviations: Decreased step length - right;Decreased dorsiflexion - left;Decreased step length - left;Decreased dorsiflexion - right;Decreased stride length     General Gait Details: Assist  to stabilize throughout distance. Cues for safety. Slow gait speed. Followed closely with recliner.   Stairs             Wheelchair Mobility    Modified Rankin (Stroke Patients Only)       Balance Overall balance assessment: Needs assistance         Standing balance support: Bilateral upper extremity supported Standing balance-Leahy Scale: Poor                              Cognition Arousal/Alertness: Awake/alert Behavior During Therapy: WFL for tasks assessed/performed Overall Cognitive Status: Within Functional Limits for tasks assessed                                        Exercises      General Comments        Pertinent Vitals/Pain Pain Assessment: Faces Faces Pain Scale: Hurts even more Pain Location: abdomen Pain Descriptors / Indicators: Discomfort;Grimacing Pain Intervention(s): Limited activity within patient's tolerance;Monitored during session;Repositioned    Home Living                      Prior Function            PT Goals (current goals can now be found in the care plan section) Progress towards PT goals: Progressing toward goals    Frequency    Min 3X/week      PT Plan Current plan remains appropriate    Co-evaluation              AM-PAC PT "6  Clicks" Mobility   Outcome Measure  Help needed turning from your back to your side while in a flat bed without using bedrails?: A Little Help needed moving from lying on your back to sitting on the side of a flat bed without using bedrails?: A Little Help needed moving to and from a bed to a chair (including a wheelchair)?: A Lot Help needed standing up from a chair using your arms (e.g., wheelchair or bedside chair)?: A Lot Help needed to walk in hospital room?: A Lot Help needed climbing 3-5 steps with a railing? : Total 6 Click Score: 13    End of Session   Activity Tolerance: Patient tolerated treatment well Patient left: in  chair;with call bell/phone within reach;with chair alarm set   PT Visit Diagnosis: Pain;Muscle weakness (generalized) (M62.81);Difficulty in walking, not elsewhere classified (R26.2)     Time: 1450-1511 PT Time Calculation (min) (ACUTE ONLY): 21 min  Charges:  $Gait Training: 8-22 mins                        Doreatha Massed, PT Acute Rehabilitation  Office: [phone] Pager: [phone]

## 2021-03-24 NOTE — Progress Notes (Signed)
Rectal carcinoma (Shane [name])  Subjective: No complaints this am.  Not much of an apettite    Objective: Vital signs in last 24 hours: Temp:  [98.6 F (37 C)-98.9 F (37.2 C)] 98.9 F (37.2 C) (05/16 2440) Pulse Rate:  [91-108] 108 (05/16 0613) Resp:  [20] 20 (05/16 0613) BP: (128-165)/(62-66) 165/66 (05/16 0613) SpO2:  [97 %-100 %] 98 % (05/16 1027) Weight:  [84.2 kg] 84.2 kg (05/16 0750) Last BM Date: 03/20/21 (colostomy)  Intake/Output from previous day: 05/15 0701 - 05/16 0700 In: 3748.2 [P.O.:240; I.V.:3430.9; IV Piggyback:67.3] Out: 3705 [Urine:3550; Drains:155] Intake/Output this shift: Total I/O In: -  Out: 650 [Urine:650]  General appearance: alert and cooperative GI: normal findings: soft, less TTP, no peritonitis, moderate distention Incision/Wound: clean, dry, intact JP: purulent Ostomy: min stool in bag LLQ drain: purulent output  Lab Results:  Results for orders placed or performed during the hospital encounter of 03/05/21 (from the past 24 hour(s))  Prealbumin     Status: Abnormal   Collection Time: 03/24/21  3:40 AM  Result Value Ref Range   Prealbumin 8.8 (L) 18 - 38 mg/dL  Comprehensive metabolic panel     Status: Abnormal   Collection Time: 03/24/21  3:40 AM  Result Value Ref Range   Sodium 133 (L) 135 - 145 mmol/L   Potassium 3.9 3.5 - 5.1 mmol/L   Chloride 103 98 - 111 mmol/L   CO2 23 22 - 32 mmol/L   Glucose, Bld 126 (H) 70 - 99 mg/dL   BUN 17 8 - 23 mg/dL   Creatinine, Ser 0.71 0.61 - 1.24 mg/dL   Calcium 8.7 (L) 8.9 - 10.3 mg/dL   Total Protein 6.9 6.5 - 8.1 g/dL   Albumin 2.0 (L) 3.5 - 5.0 g/dL   AST 29 15 - 41 U/L   ALT 30 0 - 44 U/L   Alkaline Phosphatase 130 (H) 38 - 126 U/L   Total Bilirubin 0.1 (L) 0.3 - 1.2 mg/dL   GFR, Estimated >60 >60 mL/min   Anion gap 7 5 - 15  Magnesium     Status: None   Collection Time: 03/24/21  3:40 AM  Result Value Ref Range   Magnesium 1.7 1.7 - 2.4 mg/dL  Phosphorus     Status: None   Collection  Time: 03/24/21  3:40 AM  Result Value Ref Range   Phosphorus 2.6 2.5 - 4.6 mg/dL  CBC     Status: Abnormal   Collection Time: 03/24/21  3:40 AM  Result Value Ref Range   WBC 17.8 (H) 4.0 - 10.5 K/uL   RBC 3.01 (L) 4.22 - 5.81 MIL/uL   Hemoglobin 8.4 (L) 13.0 - 17.0 g/dL   HCT 26.4 (L) 39.0 - 52.0 %   MCV 87.7 80.0 - 100.0 fL   MCH 27.9 26.0 - 34.0 pg   MCHC 31.8 30.0 - 36.0 g/dL   RDW 16.8 (H) 11.5 - 15.5 %   Platelets 689 (H) 150 - 400 K/uL   nRBC 0.2 0.0 - 0.2 %  Triglycerides     Status: None   Collection Time: 03/24/21  3:40 AM  Result Value Ref Range   Triglycerides 52 <150 mg/dL     Studies/Results Radiology     MEDS, Scheduled . acetaminophen  1,000 mg Oral Q6H  . bacitracin   Topical BID  . chlorhexidine  15 mL Mouth Rinse BID  . Chlorhexidine Gluconate Cloth  6 each Topical Daily  . feeding supplement  237 mL Oral  BID BM  . lip balm  1 application Topical BID  . mouth rinse  15 mL Mouth Rinse q12n4p  . melatonin  3 mg Oral QHS  . pantoprazole  40 mg Intravenous Q12H  . sodium chloride flush  10-40 mL Intracatheter Q12H  . sodium chloride flush  5 mL Intracatheter Q8H  . sodium chloride flush  5 mL Intracatheter Q8H  . sucralfate  1 g Oral TID WC & HS     Assessment: Rectal carcinoma (HCC)  POD 12 LAR and diverting ostomy Post op abscess: IR drain in LLQ and RLQ, completed course of Zosyn and Eraxis.  Zosyn restarted for uncontrolled abd infection on 5/11 Hemorrhagic esophagitis  Plan: Cont TPN, clears Acute blood loss anemia: Hgb stable today, has had IV Fe ID: wbc stable, will cont Zosyn. Cont IR drains  RT for pulm toilet PT for ambulation IV pain meds for now Cont Carafate, and IV PPI for esophagitis     LOS: 19 days    Rosario Adie, MD  [facility], [place]    03/24/2021 8:07 AM

## 2021-03-24 NOTE — Care Management Important Message (Signed)
Important Message  Patient Details IM Letter given to the Patient. Name: Nikan Ellingson MRN: [id number] Date of Birth: 1951/07/12   Medicare Important Message Given:  Yes     Kerin Salen 03/24/2021, 10:01 AM

## 2021-03-24 NOTE — Progress Notes (Signed)
PHARMACY - TOTAL PARENTERAL NUTRITION CONSULT NOTE   Indication: Prolonged ileus  Patient Measurements: Weight: 84.2 kg (185 lb 10 oz)   Body mass index is 26.63 kg/m.   Assessment: 70 yo male with rectal cancer s/p xi robotic assisted lower anterior resection with end colostomy on 4/27 now with prolonged ileus to start TPN on 5/2.  Glucose / Insulin: on sSSI q6h--> d/c on 5/10 - CBGs continue to be at goal < 150 from bmet Electrolytes: Na 133 down, Mag 1.7 down; other lytes wnl Renal: SCr 0.71 stable, BUN 17 Hepatic: AST/ALTWNL, tbili 0.1, alkphos 130 , TG 52 Albumin/prealbumin: alb 1.8, prealb 8.8 (5/16) up  Intake / Output; MIVF:  - I/O last 24hr net +43 ml - drain output 155 ml  - UOP 3550 mL - D5 1/4NS at [facility] per Md GI Imaging: - 5/11: New right abdominopelvic loculated ascites, suspicious for developing abscess or infected ascites GI Surgeries / Procedures:  - 4/27:  xi robotic assisted lower anterior resection with end colostomy on 4/27 - 5/6: NGT out and unable to be replaced thus far -5/12: CT guided placed of a 12 Fr drainage catheter placement into the right lower abdomen/pelvis yielding 75 cc of purulent appearing fluid  Central access: 5/2 TPN start date: 5/2  Nutritional Goals (per RD recommendation on 5/3): 2200-2400 kcal/day, protein 90-115 g/day, and >2L/day  Goal TPN rate is 90 mL/hr (provides 112 g of protein and 2199 kcals per day)   Current Nutrition:  - TPN - 5/10:  ensure surgery 237 mL BID (pt's refusing) - CL diet  Plan:   Now: give 2g Mag sulfate IV x 1    Continue TPN at goal 27mL/hr at 1800  Electrolytes in TPN:   Increase Na to 60 mEq/L  Continue K at 30 mEq/L  Ca 8mEq/L  Mg 14mEq/L  Increase Phos to 15 mmol/L  Cl:Ac - max Ac  Add standard MVI and trace elements to TPN  D5 1/4 NS at 10 ml/hr per Md  TPN labs on Mon/Thurs   Adrian Saran, PharmD, BCPS Secure Chat if ?s 03/24/2021 11:49 AM

## 2021-03-24 NOTE — TOC Progression Note (Addendum)
Transition of Care [facility]) - Progression Note    Patient Details  Name: Shane [name] MRN: [id number] Date of Birth: 29-Jun-1951  Transition of [facility]) CM/SW Contact  Lennart Pall, LCSW Phone Number: 03/24/2021, 3:29 PM  Clinical Narrative:    Met with pt and spoke with wife, via phone today to discuss dc recommendations of SNF.  They are both agreeable to this plan and hopeful will only need a few weeks and then plan to return home.  Intermittent support available in the home from wife and daughter - both are working.  Pt is motivated to do therapy and regain some level of independence.  TOC will begin insurance authorization once medically approaching readiness for dc. Pt fully vaccinated and one booster.   PASRR # [id number]  Expected Discharge Plan: [place] Barriers to Discharge: Continued Medical Work up  Expected Discharge Plan and Services Expected Discharge Plan: [place]   Discharge Planning Services: CM Consult   Living arrangements for the past 2 months: Single Family Home                                       Social Determinants of Health (SDOH) Interventions    Readmission Risk Interventions No flowsheet data found.

## 2021-03-24 NOTE — Plan of Care (Signed)
Patient continues with very poor appetite, drank a small amount of soup for supper, small sips of liquids throughout the day.  States he will "think  about it" when asked to drink surgical ensure or any other fluids and never actually drinks any.  Patient up to chair with 1 max assist and walker with nursing staff and  did work with PT today.  This RN did discuss the importance of movement for his gut motility. Ostomy did put out approximately 100 mls dark thick liquid stool.

## 2021-03-24 NOTE — Consult Note (Signed)
[place] Nurse ostomy follow up Stoma type/location: LUQ colostomy Stomal assessment/size: 1 3/4" pink moist stoma, budded but creasing noted at 3 and 9 o'clock.  Peristomal assessment: creasing noted Treatment options for stomal/peristomal skin: barrier ring and 1 piece flat pouch.   Output only tan purulence noted today. This is the same effluent noted in IR drain in LLQ.  Ostomy pouching: 1pc.flat with barrier ring  Education provided:  None  No family at bedside today. They have been educated in ostomy care.  Patient in bed with eyes closed and does not wish to have education today.  Enrolled patient in [place] Start Discharge program: Yes [place] Nurse Consult Note: Reason for Consult:Stage 2 sacral wound.  Wound type:pressure injury Pressure Injury POA: NO Measurement:1 cm x 0.3 cm x 0.1 cm  Wound ZOX:WRUE and moist Drainage (amount, consistency, odor) none  Periwound: Intact  Dressing procedure/placement/frequency:Sacral foam in place.  Change every three days and PRN soilage.  Will follow.  Domenic Moras MSN, RN, FNP-BC CWON Wound, Ostomy, Continence Nurse Pager [phone]

## 2021-03-24 NOTE — Progress Notes (Signed)
Referring Physician(s): Dr. Johney Maine  Supervising Physician: Daryll Brod  Patient Status:  [facility] - In-pt  Chief Complaint: Low anterior resection of rectal cancer Intra-abdominal fluid collections  Subjective: Sitting up in chair.   States he is tolerating small amount of clear liquids well.  Remains on TPN.  Mild abdominal pain.  Drains in place.   Allergies: Nsaids  Medications: Prior to Admission medications   Medication Sig Start Date End Date Taking? Authorizing Provider  Diclofenac Sodium 2 % SOLN Place 2 g onto the skin 2 (two) times daily. Patient taking differently: Place 2 g onto the skin daily as needed (pain). 06/30/19  Yes Lyndal Pulley, DO  docusate sodium (COLACE) 100 MG capsule Take 100 mg by mouth daily as needed for mild constipation.    Yes [provider]  HYDROcodone-acetaminophen (NORCO/VICODIN) 5-325 MG tablet Take 1 tablet by mouth every 6 (six) hours as needed for moderate pain. 02/12/21  Yes Owens Shark, NP  hydrocortisone (ANUSOL-HC) 2.5 % rectal cream Place 1 application rectally 2 (two) times daily. Patient taking differently: Place 1 application rectally daily as needed for hemorrhoids or anal itching. 01/28/21  Yes Marrian Salvage, FNP  lidocaine-prilocaine (EMLA) cream Apply 1 application topically as needed. Patient taking differently: Apply 1 application topically as needed (prior to port use). 10/21/20  Yes Ladell Pier, MD  pravastatin (PRAVACHOL) 20 MG tablet TAKE 1 TABLET(20 MG) BY MOUTH DAILY Patient taking differently: Take 20 mg by mouth daily. 11/12/20  Yes Marrian Salvage, FNP  capecitabine (XELODA) 500 MG tablet TAKE 2000 MG (4 TABLETS) EVERY MORNING AND 1500 MG (3 TABLETS) EVERY EVENING FOR A TOTAL DAILY DOSE OF 3500 MG ON RADIATION DAYS ONLY. Patient not taking: No sig reported 11/04/20 11/04/21  Ladell Pier, MD  diphenoxylate-atropine (LOMOTIL) 2.5-0.025 MG tablet Take 1-2 tablets by mouth 3 (three)  times daily as needed for diarrhea or loose stools (Maxiumum of 6/day). Patient not taking: No sig reported 12/25/20   Ladell Pier, MD  ondansetron (ZOFRAN) 8 MG tablet Take 1 tablet (8 mg total) by mouth every 8 (eight) hours as needed for nausea or vomiting. Start 72 hours after each IV chemotherapy treatment 07/30/20   Ladell Pier, MD  prochlorperazine (COMPAZINE) 10 MG tablet Take 1 tablet (10 mg total) by mouth every 6 (six) hours as needed for nausea. 07/30/20   Ladell Pier, MD     Vital Signs: BP (!) 165/66 (BP Location: Left Arm)   Pulse (!) 108   Temp 98.9 F (37.2 C) (Oral)   Resp 20   Wt 185 lb 10 oz (84.2 kg)   SpO2 98%   BMI 26.63 kg/m   Physical Exam  NAD, alert, sitting up in chair Abdomen: soft, mild generalized tenderness.  RLQ drain in place. Insertion site intact.  Serous fluid in gravity bag.  LUQ drain in place. Insertion site intact. Purulent fluid in gravity bag.   Imaging: CT IMAGE GUIDED DRAINAGE BY PERCUTANEOUS CATHETER  Result Date: 03/20/2021 INDICATION: History of breast cancer, post LAR with end colostomy complicated by development of postoperative abscesses, post ultrasound-guided placement of left upper quadrant percutaneous drainage catheter on 03/12/2021. Given recurrent fevers, CT scan the abdomen pelvis performed on 03/19/2021 demonstrating a new collection within the right pericolic gutter extending to the level of the pelvis. Request made for placement of an additional percutaneous drainage catheter for infection source control purposes. EXAM: CT IMAGE GUIDED DRAINAGE BY PERCUTANEOUS CATHETER  COMPARISON:  CT abdomen pelvis-03/19/2021; 03/11/2021; ultrasound-guided left upper abdominal quadrant percutaneous drainage catheter placement-03/11/2021 MEDICATIONS: The patient is currently admitted to the hospital and receiving intravenous antibiotics. The antibiotics were administered within an appropriate time frame prior to the initiation of the  procedure. ANESTHESIA/SEDATION: Moderate (conscious) sedation was employed during this procedure. A total of Versed 2 mg and Fentanyl 100 mcg was administered intravenously. Moderate Sedation Time: 14 minutes. The patient's level of consciousness and vital signs were monitored continuously by radiology nursing throughout the procedure under my direct supervision. CONTRAST:  None COMPLICATIONS: None immediate. PROCEDURE: Informed written consent was obtained from the patient after a discussion of the risks, benefits and alternatives to treatment. The patient was placed supine on the CT gantry and a pre procedural CT was performed re-demonstrating the known abscess/fluid collection within the right pericolic gutter with dominant component within the right lower quadrant/pelvis measuring approximately 7.7 x 5.0 cm (image 88, series 2). The procedure was planned. A timeout was performed prior to the initiation of the procedure. The pelvis skin overlying the ventral aspect of the right lower abdomen/pelvis was prepped and draped in the usual sterile fashion. The overlying soft tissues were anesthetized with 1% lidocaine with epinephrine. Appropriate trajectory was planned with the use of a 22 gauge spinal needle. An 18 gauge trocar needle was advanced into the abscess/fluid collection and a short Amplatz super stiff wire was coiled within the collection. Appropriate positioning was confirmed with a limited CT scan. The tract was serially dilated allowing placement of a 12 [place] all-purpose drainage catheter. Appropriate positioning was confirmed with a limited postprocedural CT scan. Approximately 75 ml of purulent fluid was aspirated. The tube was connected to a drainage bag and sutured in place. A dressing was applied. The patient tolerated the procedure well without immediate post procedural complication. IMPRESSION: Successful CT guided placement of a 25 French all purpose drain catheter into the right lower  abdominal quadrant/pelvic abscess with aspiration of 75 mL of purulent fluid. Samples were sent to the laboratory as requested by the ordering clinical team. Electronically Signed   By: Sandi Mariscal M.D.   On: 03/20/2021 16:25    Labs:  CBC: Recent Labs    03/21/21 0500 03/22/21 0416 03/23/21 0350 03/24/21 0340  WBC 17.7* 16.1* 17.4* 17.8*  HGB 6.9* 7.8* 8.5* 8.4*  HCT 22.7* 25.2* 26.8* 26.4*  PLT 579* 595* 668* 689*    COAGS: Recent Labs    03/20/21 0313  INR 1.3*    BMP: Recent Labs    07/04/20 0845 07/15/20 0048 08/13/20 0906 03/21/21 0500 03/22/21 0416 03/23/21 0350 03/24/21 0340  NA 138 136   < > 143 136 137 133*  K 3.8 3.7   < > 4.6 4.0 4.2 3.9  CL 108 106   < > 115* 108 107 103  CO2 25 21*   < > 24 22 22 23   GLUCOSE 105* 100*   < > 112* 110* 123* 126*  BUN 12 21   < > 36* 25* 19 17  CALCIUM 9.9 9.2   < > 8.6* 8.6* 8.8* 8.7*  CREATININE 0.96 0.94   < > 1.17 0.74 0.66 0.71  GFRNONAA >60 >60   < > >60 >60 >60 >60  GFRAA >60 >60  --   --   --   --   --    < > = values in this interval not displayed.    LIVER FUNCTION TESTS: Recent Labs    03/15/21  0133 03/17/21 0018 03/20/21 0313 03/24/21 0340  BILITOT 0.7 0.6 0.2* 0.1*  AST 53* 44* 38 29  ALT 40 36 33 30  ALKPHOS 84 78 104 130*  PROT 5.8* 6.2* 6.2* 6.9  ALBUMIN 2.0* 2.0* 1.8* 2.0*    Assessment and Plan: Low anterior resection for rectal cancer with intra-abdominal fluid collections s/p LUQ drain placement 5/3 and RLQ drain placement 5/12 by Dr. Pascal Lux Patient assessed at bedside this AM.  RLQ drain with small amount of serous-appearing fluid, 20 mL output overnight.  LUQ drain with ongoing purulent-appearing fluid, similar in nature to right-sided surgical drain output Feculent material in colostomy.   WBC stable at 17.2  Afebrile.   Continue current drain management with regular TID flushes and dressing changes PRN.  IR following.   Electronically Signed: Docia Barrier,  PA 03/24/2021, 11:20 AM   I spent a total of 15 Minutes at the the patient's bedside AND on the patient's hospital floor or unit, greater than 50% of which was counseling/coordinating care for intra-abdominal fluid collections.

## 2021-03-25 DIAGNOSIS — L8990 Pressure ulcer of unspecified site, unspecified stage: Secondary | ICD-10-CM | POA: Insufficient documentation

## 2021-03-25 LAB — BASIC METABOLIC PANEL
Anion gap: 6 (ref 5–15)
BUN: 18 mg/dL (ref 8–23)
CO2: 27 mmol/L (ref 22–32)
Calcium: 8.8 mg/dL — ABNORMAL LOW (ref 8.9–10.3)
Chloride: 105 mmol/L (ref 98–111)
Creatinine, Ser: 0.69 mg/dL (ref 0.61–1.24)
GFR, Estimated: >60 mL/min (ref >60)
Glucose, Bld: 121 mg/dL — ABNORMAL HIGH (ref 70–99)
Potassium: 3.3 mmol/L — ABNORMAL LOW (ref 3.5–5.1)
Sodium: 138 mmol/L (ref 135–145)

## 2021-03-25 LAB — MAGNESIUM: Magnesium: 2 mg/dL (ref 1.7–2.4)

## 2021-03-25 LAB — CBC
HCT: 26.4 % — ABNORMAL LOW (ref 39.0–52.0)
Hemoglobin: 8.3 g/dL — ABNORMAL LOW (ref 13.0–17.0)
MCH: 27.5 pg (ref 26.0–34.0)
MCHC: 31.4 g/dL (ref 30.0–36.0)
MCV: 87.4 fL (ref 80.0–100.0)
Platelets: 704 10*3/uL — ABNORMAL HIGH (ref 150–400)
RBC: 3.02 MIL/uL — ABNORMAL LOW (ref 4.22–5.81)
RDW: 16.4 % — ABNORMAL HIGH (ref 11.5–15.5)
WBC: 16.3 10*3/uL — ABNORMAL HIGH (ref 4.0–10.5)
nRBC: 0 % (ref 0.0–0.2)

## 2021-03-25 LAB — PHOSPHORUS: Phosphorus: 3 mg/dL (ref 2.5–4.6)

## 2021-03-25 MED ORDER — PROSOURCE PLUS PO LIQD
30.0000 mL | Freq: Every day | ORAL | Status: DC
  Administered 2021-03-25 – 2021-04-07 (×9): 30 mL via ORAL
  Filled 2021-03-25 (×10): qty 30

## 2021-03-25 MED ORDER — BOOST / RESOURCE BREEZE PO LIQD CUSTOM
1.0000 | ORAL | Status: DC
  Administered 2021-03-26 – 2021-04-01 (×5): 1 via ORAL

## 2021-03-25 MED ORDER — TRAVASOL 10 % IV SOLN
INTRAVENOUS | Status: AC
  Filled 2021-03-25: qty 1123.2

## 2021-03-25 MED ORDER — POTASSIUM CHLORIDE 10 MEQ/100ML IV SOLN
10.0000 meq | INTRAVENOUS | Status: AC
  Administered 2021-03-25 (×4): 10 meq via INTRAVENOUS
  Filled 2021-03-25 (×4): qty 100

## 2021-03-25 NOTE — Progress Notes (Signed)
PHARMACY - TOTAL PARENTERAL NUTRITION CONSULT NOTE   Indication: Prolonged ileus  Patient Measurements: Weight: 84.8 kg (186 lb 15.2 oz)   Body mass index is 26.82 kg/m.   Assessment: 70 yo male with rectal cancer s/p xi robotic assisted lower anterior resection with end colostomy on 4/27 now with prolonged ileus to start TPN on 5/2.  Glucose / Insulin: CBGs continue to be at goal < 150 from bmet, not currently on SSI due to continued CBGs at goal Electrolytes: Na 138 improved, Mag 2 improved, phos 3 improved, K 3.3 down - will order IV replacement Renal: SCr 0.71 stable, BUN 17 Hepatic: AST/ALTWNL, tbili 0.1, alkphos 130 , TG 52 Albumin/prealbumin: alb 1.8, prealb 8.8 (5/16) up  Intake / Output; MIVF:  - I/O last 24hr net - not accurate per charting - drain output 110 ml  - colostomy output 700 mL stool - UOP 4650 mL - D5 1/4NS at [facility] per Md GI Imaging: - 5/11: New right abdominopelvic loculated ascites, suspicious for developing abscess or infected ascites GI Surgeries / Procedures:  - 4/27:  xi robotic assisted lower anterior resection with end colostomy on 4/27 - 5/6: NGT out and unable to be replaced thus far -5/12: CT guided placed of a 12 Fr drainage catheter placement into the right lower abdomen/pelvis yielding 75 cc of purulent appearing fluid  Central access: 5/2 TPN start date: 5/2  Nutritional Goals (per RD recommendation on 5/3): 2200-2400 kcal/day, protein 90-115 g/day, and >2L/day  Goal TPN rate is 90 mL/hr (provides 112 g of protein and 2199 kcals per day)   Current Nutrition:  - TPN - Ensure surgery 237 mL BID (pt's has refused since 5/11) - CL diet but pt with little to no appetite   Plan:   Now: give 33mEq KCL IV    Continue TPN at goal 80mL/hr at 1800  Electrolytes in TPN:   Continue Na at 60 mEq/L  Increase K 35 mEq/L  Ca 60mEq/L  Mg 53mEq/L  Continue Phos at 15 mmol/L  Cl:Ac - max Ac  Add standard MVI and trace elements to  TPN  D5 1/4 NS at 10 ml/hr per Md  TPN labs on Mon/Thurs   Adrian Saran, PharmD, BCPS Secure Chat if ?s 03/25/2021 7:24 AM

## 2021-03-25 NOTE — Progress Notes (Signed)
Rectal carcinoma (HCC)  Subjective: No complaints this am.  Tolerating clears.  Ambulated some yesterday with PT  Objective: Vital signs in last 24 hours: Temp:  [97.5 F (36.4 C)-99.6 F (37.6 C)] 98.4 F (36.9 C) (05/17 0526) Pulse Rate:  [89-104] 89 (05/17 0526) Resp:  [20] 20 (05/17 0526) BP: (147-164)/(61-65) 164/64 (05/17 0526) SpO2:  [97 %-100 %] 98 % (05/17 0526) Weight:  [84.8 kg] 84.8 kg (05/17 0500) Last BM Date:  (ostomy)  Intake/Output from previous day: 05/16 0701 - 05/17 0700 In: 2563.2 [P.O.:480; I.V.:1800.7; IV Piggyback:257.5] Out: 6283 [Urine:4650; Drains:190; Stool:700] Intake/Output this shift: No intake/output data recorded.  General appearance: alert and cooperative GI: normal findings: soft, less TTP, no peritonitis, moderate distention Incision/Wound: clean, dry, intact JP: purulent Ostomy: min stool in bag LLQ drain: purulent output RLQ drain: clear ss fluid  Lab Results:  Results for orders placed or performed during the hospital encounter of 03/05/21 (from the past 24 hour(s))  CBC     Status: Abnormal   Collection Time: 03/25/21  4:08 AM  Result Value Ref Range   WBC 16.3 (H) 4.0 - 10.5 K/uL   RBC 3.02 (L) 4.22 - 5.81 MIL/uL   Hemoglobin 8.3 (L) 13.0 - 17.0 g/dL   HCT 26.4 (L) 39.0 - 52.0 %   MCV 87.4 80.0 - 100.0 fL   MCH 27.5 26.0 - 34.0 pg   MCHC 31.4 30.0 - 36.0 g/dL   RDW 16.4 (H) 11.5 - 15.5 %   Platelets 704 (H) 150 - 400 K/uL   nRBC 0.0 0.0 - 0.2 %  Basic metabolic panel     Status: Abnormal   Collection Time: 03/25/21  4:08 AM  Result Value Ref Range   Sodium 138 135 - 145 mmol/L   Potassium 3.3 (L) 3.5 - 5.1 mmol/L   Chloride 105 98 - 111 mmol/L   CO2 27 22 - 32 mmol/L   Glucose, Bld 121 (H) 70 - 99 mg/dL   BUN 18 8 - 23 mg/dL   Creatinine, Ser 0.69 0.61 - 1.24 mg/dL   Calcium 8.8 (L) 8.9 - 10.3 mg/dL   GFR, Estimated >60 >60 mL/min   Anion gap 6 5 - 15  Phosphorus     Status: None   Collection Time: 03/25/21  4:08  AM  Result Value Ref Range   Phosphorus 3.0 2.5 - 4.6 mg/dL  Magnesium     Status: None   Collection Time: 03/25/21  4:08 AM  Result Value Ref Range   Magnesium 2.0 1.7 - 2.4 mg/dL     Studies/Results Radiology     MEDS, Scheduled . acetaminophen  1,000 mg Oral Q6H  . bacitracin   Topical BID  . chlorhexidine  15 mL Mouth Rinse BID  . Chlorhexidine Gluconate Cloth  6 each Topical Daily  . feeding supplement  237 mL Oral BID BM  . lip balm  1 application Topical BID  . mouth rinse  15 mL Mouth Rinse q12n4p  . melatonin  3 mg Oral QHS  . pantoprazole  40 mg Intravenous Q12H  . sodium chloride flush  10-40 mL Intracatheter Q12H  . sodium chloride flush  5 mL Intracatheter Q8H  . sodium chloride flush  5 mL Intracatheter Q8H  . sucralfate  1 g Oral TID WC & HS     Assessment: Rectal carcinoma (HCC)  POD 20 LAR and diverting ostomy Post op abscess: IR drain in LLQ and RLQ, completed course of Zosyn and Eraxis.  Zosyn restarted for uncontrolled abd infection on 5/11 Hemorrhagic esophagitis  Plan: Cont TPN, advance to fulls Acute blood loss anemia: Hgb stable today, has had IV Fe and several transfusions ID: wbc stable, will cont Zosyn. Cont IR drains  RT for pulm toilet PT for ambulation IV and PO pain meds, attempt to wean IV dilaudid Cont Carafate, and IV PPI for esophagitis     LOS: 20 days    Rosario Adie, MD  [facility], [place]    03/25/2021 8:24 AM

## 2021-03-25 NOTE — Progress Notes (Signed)
Referring Physician(s): Gross,S  Supervising Physician: Arne Cleveland  Patient Status:  [name] - In-pt  Chief Complaint: Abdominal pain/abscesses   Subjective: Pt sitting up in chair; currently without new complaints   Allergies: Nsaids  Medications: Prior to Admission medications   Medication Sig Start Date End Date Taking? Authorizing Provider  Diclofenac Sodium 2 % SOLN Place 2 g onto the skin 2 (two) times daily. Patient taking differently: Place 2 g onto the skin daily as needed (pain). 06/30/19  Yes Lyndal Pulley, DO  docusate sodium (COLACE) 100 MG capsule Take 100 mg by mouth daily as needed for mild constipation.    Yes [provider]  HYDROcodone-acetaminophen (NORCO/VICODIN) 5-325 MG tablet Take 1 tablet by mouth every 6 (six) hours as needed for moderate pain. 02/12/21  Yes Owens Shark, NP  hydrocortisone (ANUSOL-HC) 2.5 % rectal cream Place 1 application rectally 2 (two) times daily. Patient taking differently: Place 1 application rectally daily as needed for hemorrhoids or anal itching. 01/28/21  Yes Marrian Salvage, FNP  lidocaine-prilocaine (EMLA) cream Apply 1 application topically as needed. Patient taking differently: Apply 1 application topically as needed (prior to port use). 10/21/20  Yes Ladell Pier, MD  pravastatin (PRAVACHOL) 20 MG tablet TAKE 1 TABLET(20 MG) BY MOUTH DAILY Patient taking differently: Take 20 mg by mouth daily. 11/12/20  Yes Marrian Salvage, FNP  capecitabine (XELODA) 500 MG tablet TAKE 2000 MG (4 TABLETS) EVERY MORNING AND 1500 MG (3 TABLETS) EVERY EVENING FOR A TOTAL DAILY DOSE OF 3500 MG ON RADIATION DAYS ONLY. Patient not taking: No sig reported 11/04/20 11/04/21  Ladell Pier, MD  diphenoxylate-atropine (LOMOTIL) 2.5-0.025 MG tablet Take 1-2 tablets by mouth 3 (three) times daily as needed for diarrhea or loose stools (Maxiumum of 6/day). Patient not taking: No sig reported 12/25/20   Ladell Pier, MD  ondansetron (ZOFRAN) 8 MG tablet Take 1 tablet (8 mg total) by mouth every 8 (eight) hours as needed for nausea or vomiting. Start 72 hours after each IV chemotherapy treatment 07/30/20   Ladell Pier, MD  prochlorperazine (COMPAZINE) 10 MG tablet Take 1 tablet (10 mg total) by mouth every 6 (six) hours as needed for nausea. 07/30/20   Ladell Pier, MD     Vital Signs: BP (!) 164/64 (BP Location: Left Arm)   Pulse 89   Temp 98.4 F (36.9 C) (Oral)   Resp 20   Wt 186 lb 15.2 oz (84.8 kg)   SpO2 98%   BMI 26.82 kg/m   Physical Exam patient awake, alert; still appears tired; both right and left abdominal drains intact, insertion sites okay, output from right 50 cc yellow fluid, left 110 cc turbid beige-colored fluid  Imaging: No results found.  Labs:  CBC: Recent Labs    03/22/21 0416 03/23/21 0350 03/24/21 0340 03/25/21 0408  WBC 16.1* 17.4* 17.8* 16.3*  HGB 7.8* 8.5* 8.4* 8.3*  HCT 25.2* 26.8* 26.4* 26.4*  PLT 595* 668* 689* 704*    COAGS: Recent Labs    03/20/21 0313  INR 1.3*    BMP: Recent Labs    07/04/20 0845 07/15/20 0048 08/13/20 0906 03/22/21 0416 03/23/21 0350 03/24/21 0340 03/25/21 0408  NA 138 136   < > 136 137 133* 138  K 3.8 3.7   < > 4.0 4.2 3.9 3.3*  CL 108 106   < > 108 107 103 105  CO2 25 21*   < > 22 22  23 27  GLUCOSE 105* 100*   < > 110* 123* 126* 121*  BUN 12 21   < > 25* 19 17 18   CALCIUM 9.9 9.2   < > 8.6* 8.8* 8.7* 8.8*  CREATININE 0.96 0.94   < > 0.74 0.66 0.71 0.69  GFRNONAA >60 >60   < > >60 >60 >60 >60  GFRAA >60 >60  --   --   --   --   --    < > = values in this interval not displayed.    LIVER FUNCTION TESTS: Recent Labs    03/15/21 0133 03/17/21 0018 03/20/21 0313 03/24/21 0340  BILITOT 0.7 0.6 0.2* 0.1*  AST 53* 44* 38 29  ALT 40 36 33 30  ALKPHOS 84 78 104 130*  PROT 5.8* 6.2* 6.2* 6.9  ALBUMIN 2.0* 2.0* 1.8* 2.0*    Assessment and Plan: Assessment and Plan: Patient with history of rectal  cancer, status post LAR with end colostomy;now with postop left upper quadrant abdabscess, s/pdrain placement on 5/3; status post drainage of right lower abdominal quadrant/pelvic abscess on 5/12; afebrile, WBC 16.3 down from 17.8, hemoglobin 8.3 down slightly from 8.4, creatinine normal, new abscess cultures growing rare Enterococcus; continue current treatment/output-lab monitoring, drain irrigations; once drain outputs are minimal or if WBC shows increasing trend obtain follow-up CT scan; other plans as per [facility]   Electronically Signed: D. Rowe Robert, PA-C 03/25/2021, 10:08 AM   I spent a total of 15 minutes at the the patient's bedside AND on the patient's hospital floor or unit, greater than 50% of which was counseling/coordinating care for right/left abdominal abscess drains    [name], male   DOB: 1951/07/08, 70 y.o.   MRN: [id number]

## 2021-03-25 NOTE — Progress Notes (Signed)
Nutrition Follow-up  DOCUMENTATION CODES:   Non-severe (moderate) malnutrition in context of chronic illness  INTERVENTION:  - will d/c Ensure Surgery per patient request. - will order Boost Breeze once/day, each supplement provides 250 kcal and 9 grams of protein. - will order 30 ml Prosource Plus once/day, each supplement provides 100 kcal and 15 grams protein.  - continue diet advancement as medically feasible. - TPN per Pharmacist.   NUTRITION DIAGNOSIS:   Moderate Malnutrition related to chronic illness,cancer and cancer related treatments as evidenced by mild fat depletion,moderate fat depletion,mild muscle depletion,moderate muscle depletion,severe muscle depletion. -ongoing  GOAL:   Patient will meet greater than or equal to 90% of their needs -met with TPN regimen and PO intakes   MONITOR:   PO intake,Supplement acceptance,Diet advancement,Labs,Weight trends,Other (Comment) (TPN regimen)  ASSESSMENT:   70 yo male with a PMH of arthritis and recent dx (08/2020) of rectal cancer (current treatment of FolFox) who presents with rectal bleeding. Pt with AKI and ileus.  Significant Events: 4/27- anterior resection with end colostomy; diet advanced to CLD 4/28- NPO 4/30- advanced to CLD then back to NPO 5/1- NGT placed 5/2- double lumen PICC placed in R basilic; TPN initiation   5/4- NGT removed 5/9- diet advanced to CLD 5/10- diet advanced to FLD 5/11- diet downgraded to NPO 5/14- diet advanced to CLD   Diet advanced to FLD today at 0824. Patient reported having ~75% of breakfast this AM on FLD and that he tolerated it well. He had not received lunch at the time of RD visit earlier this afternoon so lunch tray ordered by RD at that time. Patient had requested cream of potato soup.  Patient's daughter was at bedside and current diet order and items on this diet were reviewed with her.  Patient is ordered Ensure Surgery BID but he has mainly been sipping on them or  refusing them because he feels that they cause GI upset for him.   Weight has fluctuated nearly daily over the past week. Non-pitting edema to BLE documented in the edema section of flow sheet.  He continues to receive custom TPN at goal rate of  90 ml/hr which is providing 2199 kcal and 112 grams protein.    Labs reviewed; K: 3.3 mmol/l. Medications reviewed; 3 mg melatonin/night, 40 mg IV protonix BID, 1 g carafate TID. IVF; D5-1/4 NS @ 10 ml/hr (41 kcal/24 hrs).   Diet Order:   Diet Order            Diet full liquid Room service appropriate? Yes; Fluid consistency: Thin  Diet effective now                 EDUCATION NEEDS:   Education needs have been addressed  Skin:  Skin Assessment: Skin Integrity Issues: Skin Integrity Issues:: Stage II,Other (Comment) Stage II: sacrum (newly documented 5/13) Incisions: Abdomen, closed Other: MASD (newly documented 5/17)  Last BM:  5/10 (150 ml via ostomy)  Height:   Ht Readings from Last 1 Encounters:  02/26/21 5' 10"  (1.778 m)    Weight:   Wt Readings from Last 1 Encounters:  03/25/21 84.8 kg    Estimated Nutritional Needs:  Kcal:  2200-2400 Protein:  90-115 grams Fluid:  >2 L      Jarome Matin, MS, RD, LDN, CNSC Inpatient Clinical Dietitian RD pager # available in [place]  After hours/weekend pager # available in [facility]

## 2021-03-25 NOTE — Plan of Care (Signed)
  Problem: Education: Goal: Knowledge of ostomy care will improve Outcome: Progressing Goal: Understanding of discharge needs will improve Outcome: Progressing   Problem: Coping: Goal: Coping ability will improve Outcome: Progressing   Problem: Fluid Volume: Goal: Ability to achieve a balanced intake and output will improve Outcome: Progressing   Problem: Clinical Measurements: Goal: Postoperative complications will be avoided or minimized Outcome: Progressing

## 2021-03-25 NOTE — Plan of Care (Signed)
  Problem: Education: Goal: Knowledge of ostomy care will improve Outcome: Progressing   Problem: Bowel/Gastric/Urinary: Goal: Gastrointestinal status for postoperative course will improve Outcome: Progressing

## 2021-03-25 NOTE — Progress Notes (Signed)
Transferred pt to 1320. report called to Clarene Critchley, RN earlier. SR, RN

## 2021-03-26 LAB — AEROBIC/ANAEROBIC CULTURE W GRAM STAIN (SURGICAL/DEEP WOUND)

## 2021-03-26 LAB — BASIC METABOLIC PANEL
Anion gap: 5 (ref 5–15)
BUN: 18 mg/dL (ref 8–23)
CO2: 28 mmol/L (ref 22–32)
Calcium: 8.8 mg/dL — ABNORMAL LOW (ref 8.9–10.3)
Chloride: 107 mmol/L (ref 98–111)
Creatinine, Ser: 0.59 mg/dL — ABNORMAL LOW (ref 0.61–1.24)
GFR, Estimated: >60 mL/min (ref >60)
Glucose, Bld: 116 mg/dL — ABNORMAL HIGH (ref 70–99)
Potassium: 3.6 mmol/L (ref 3.5–5.1)
Sodium: 140 mmol/L (ref 135–145)

## 2021-03-26 LAB — GLUCOSE, CAPILLARY
Glucose-Capillary: 128 mg/dL — ABNORMAL HIGH (ref 70–99)
Glucose-Capillary: 101 mg/dL — ABNORMAL HIGH (ref 70–99)

## 2021-03-26 LAB — MAGNESIUM: Magnesium: 2 mg/dL (ref 1.7–2.4)

## 2021-03-26 LAB — PHOSPHORUS: Phosphorus: 2.9 mg/dL (ref 2.5–4.6)

## 2021-03-26 MED ORDER — TRACE MINERALS CU-MN-SE-ZN 300-55-60-3000 MCG/ML IV SOLN
INTRAVENOUS | Status: AC
  Filled 2021-03-26: qty 561.6

## 2021-03-26 NOTE — Progress Notes (Signed)
Physical Therapy Treatment Patient Details Name: Shane [name] MRN: [id number] DOB: 08-Sep-1951 Today's Date: 03/26/2021    History of Present Illness The patient is a 70 year old male who presents with colorectal cancer, in  ED 03/05/21 with complaints of hemorrhoids and rectal bleeding, anemia. CT showed a large rectal mass. S/P ROBOTIC ASSISTED LOWER ANTERIOR RESECTION WITH END COLOSTOMY  MOBILIZATION OF THE SPLENIC FLEXURE  on 03/05/21, post op tachycardia and hypotension and abscess-drains placed.    PT Comments    Pt tolerated significant increase in activity level today. He ambulated 200' with RW, no loss of balance, distance limited by fatigue. Pt reported R sided chest discomfort with deep breathing, RN notified. SaO2 100% on room air ~1 minute after ambulation. PT now recommending HHPT rather than ST-SNF 2* good progress with mobility.    Follow Up Recommendations  Home health PT     Equipment Recommendations  Rolling walker with 5" wheels    Recommendations for Other Services       Precautions / Restrictions Precautions Precautions: Fall Precaution Comments: colostomy, scrotal edema, B drains Restrictions Weight Bearing Restrictions: No    Mobility  Bed Mobility Overal bed mobility: Modified Independent Bed Mobility: Supine to Sit;Sit to Supine     Supine to sit: Modified independent (Device/Increase time)   Sit to sidelying: Modified independent (Device/Increase time) General bed mobility comments: Partial logroll. Increased time. Pt relied on bedrail.    Transfers Overall transfer level: Needs assistance Equipment used: Rolling walker (2 wheeled) Transfers: Sit to/from Stand Sit to Stand: Min assist         General transfer comment: VCs hand placement, min A to power up  Ambulation/Gait Ambulation/Gait assistance: Supervision Gait Distance (Feet): 200 Feet Assistive device: Rolling walker (2 wheeled) Gait Pattern/deviations: Step-through  pattern;Decreased stride length Gait velocity: WFL   General Gait Details: steady, no loss of balance, 5/10 abdominal pain with walking, distance limited by fatigue   Stairs             Wheelchair Mobility    Modified Rankin (Stroke Patients Only)       Balance Overall balance assessment: Needs assistance Sitting-balance support: Feet supported;No upper extremity supported Sitting balance-Leahy Scale: Good     Standing balance support: Bilateral upper extremity supported Standing balance-Leahy Scale: Fair                              Cognition Arousal/Alertness: Awake/alert Behavior During Therapy: WFL for tasks assessed/performed Overall Cognitive Status: Within Functional Limits for tasks assessed                                        Exercises      General Comments        Pertinent Vitals/Pain Pain Score: 5  Pain Location: abdomen Pain Descriptors / Indicators: Discomfort;Grimacing Pain Intervention(s): Limited activity within patient's tolerance;Monitored during session;Premedicated before session;Repositioned    Home Living                      Prior Function            PT Goals (current goals can now be found in the care plan section) Acute Rehab PT Goals Patient Stated Goal: to do yardwork, walk more frequently PT Goal Formulation: With patient/family Time For Goal Achievement: 04/09/21 Potential to Achieve  Goals: Good Progress towards PT goals: Progressing toward goals    Frequency    Min 3X/week      PT Plan Discharge plan needs to be updated    Co-evaluation              AM-PAC PT "6 Clicks" Mobility   Outcome Measure  Help needed turning from your back to your side while in a flat bed without using bedrails?: A Little Help needed moving from lying on your back to sitting on the side of a flat bed without using bedrails?: A Little Help needed moving to and from a bed to a chair  (including a wheelchair)?: A Little Help needed standing up from a chair using your arms (e.g., wheelchair or bedside chair)?: A Little Help needed to walk in hospital room?: A Little Help needed climbing 3-5 steps with a railing? : A Little 6 Click Score: 18    End of Session Equipment Utilized During Treatment: Gait belt Activity Tolerance: Patient tolerated treatment well Patient left: with call bell/phone within reach;in bed;with bed alarm set;with family/visitor present Nurse Communication: Mobility status PT Visit Diagnosis: Pain;Muscle weakness (generalized) (M62.81);Difficulty in walking, not elsewhere classified (R26.2)     Time: 1040-1101 PT Time Calculation (min) (ACUTE ONLY): 21 min  Charges:  $Gait Training: 8-22 mins                     Blondell Reveal Kistler PT 03/26/2021  Acute Rehabilitation Services Pager [phone] Office [phone]

## 2021-03-26 NOTE — Progress Notes (Signed)
Referring Physician(s): Dr. Johney Maine  Supervising Physician: Daryll Brod  Patient Status:  [facility] - In-pt  Chief Complaint: Follow up LUQ drain placed 5/3 and RLQ drain placed 5/12 in IR  Subjective:  Patient sitting in chair, denies complaints.  Allergies: Nsaids  Medications: Prior to Admission medications   Medication Sig Start Date End Date Taking? Authorizing Provider  Diclofenac Sodium 2 % SOLN Place 2 g onto the skin 2 (two) times daily. Patient taking differently: Place 2 g onto the skin daily as needed (pain). 06/30/19  Yes Lyndal Pulley, DO  docusate sodium (COLACE) 100 MG capsule Take 100 mg by mouth daily as needed for mild constipation.    Yes [provider]  HYDROcodone-acetaminophen (NORCO/VICODIN) 5-325 MG tablet Take 1 tablet by mouth every 6 (six) hours as needed for moderate pain. 02/12/21  Yes Owens Shark, NP  hydrocortisone (ANUSOL-HC) 2.5 % rectal cream Place 1 application rectally 2 (two) times daily. Patient taking differently: Place 1 application rectally daily as needed for hemorrhoids or anal itching. 01/28/21  Yes Marrian Salvage, FNP  lidocaine-prilocaine (EMLA) cream Apply 1 application topically as needed. Patient taking differently: Apply 1 application topically as needed (prior to port use). 10/21/20  Yes Ladell Pier, MD  pravastatin (PRAVACHOL) 20 MG tablet TAKE 1 TABLET(20 MG) BY MOUTH DAILY Patient taking differently: Take 20 mg by mouth daily. 11/12/20  Yes Marrian Salvage, FNP  capecitabine (XELODA) 500 MG tablet TAKE 2000 MG (4 TABLETS) EVERY MORNING AND 1500 MG (3 TABLETS) EVERY EVENING FOR A TOTAL DAILY DOSE OF 3500 MG ON RADIATION DAYS ONLY. Patient not taking: No sig reported 11/04/20 11/04/21  Ladell Pier, MD  diphenoxylate-atropine (LOMOTIL) 2.5-0.025 MG tablet Take 1-2 tablets by mouth 3 (three) times daily as needed for diarrhea or loose stools (Maxiumum of 6/day). Patient not taking: No sig reported  12/25/20   Ladell Pier, MD  ondansetron (ZOFRAN) 8 MG tablet Take 1 tablet (8 mg total) by mouth every 8 (eight) hours as needed for nausea or vomiting. Start 72 hours after each IV chemotherapy treatment 07/30/20   Ladell Pier, MD  prochlorperazine (COMPAZINE) 10 MG tablet Take 1 tablet (10 mg total) by mouth every 6 (six) hours as needed for nausea. 07/30/20   Ladell Pier, MD     Vital Signs: BP (!) 162/76 (BP Location: Left Arm)   Pulse (!) 103   Temp 98.8 F (37.1 C) (Oral)   Resp 18   Wt 186 lb 15.2 oz (84.8 kg)   SpO2 100%   BMI 26.82 kg/m   Physical Exam Vitals and nursing note reviewed.  Constitutional:      General: He is not in acute distress. HENT:     Head: Normocephalic.  Cardiovascular:     Rate and Rhythm: Tachycardia present.  Pulmonary:     Effort: Pulmonary effort is normal.  Abdominal:     Comments: (+) LUQ turbid, insertion site unremarkable (+) RLQ serous, insertion site unremarkable  Skin:    General: Skin is warm and dry.  Neurological:     Mental Status: He is alert. Mental status is at baseline.     Imaging: No results found.  Labs:  CBC: Recent Labs    03/22/21 0416 03/23/21 0350 03/24/21 0340 03/25/21 0408  WBC 16.1* 17.4* 17.8* 16.3*  HGB 7.8* 8.5* 8.4* 8.3*  HCT 25.2* 26.8* 26.4* 26.4*  PLT 595* 668* 689* 704*    COAGS: Recent Labs  03/20/21 0313  INR 1.3*    BMP: Recent Labs    07/04/20 0845 07/15/20 0048 08/13/20 0906 03/23/21 0350 03/24/21 0340 03/25/21 0408 03/26/21 0314  NA 138 136   < > 137 133* 138 140  K 3.8 3.7   < > 4.2 3.9 3.3* 3.6  CL 108 106   < > 107 103 105 107  CO2 25 21*   < > 22 23 27 28   GLUCOSE 105* 100*   < > 123* 126* 121* 116*  BUN 12 21   < > 19 17 18 18   CALCIUM 9.9 9.2   < > 8.8* 8.7* 8.8* 8.8*  CREATININE 0.96 0.94   < > 0.66 0.71 0.69 0.59*  GFRNONAA >60 >60   < > >60 >60 >60 >60  GFRAA >60 >60  --   --   --   --   --    < > = values in this interval not displayed.     LIVER FUNCTION TESTS: Recent Labs    03/15/21 0133 03/17/21 0018 03/20/21 0313 03/24/21 0340  BILITOT 0.7 0.6 0.2* 0.1*  AST 53* 44* 38 29  ALT 40 36 33 30  ALKPHOS 84 78 104 130*  PROT 5.8* 6.2* 6.2* 6.9  ALBUMIN 2.0* 2.0* 1.8* 2.0*    Assessment and Plan:  70 y/o M s/p LUQ and RLQ drain placed in IR seen today for follow up. No concerns noted with drains by staff, output from LUQ 40 cc and RLQ 70 cc.   Continue current drain management, once output <10 cc in 24/hr excluding flush will plan for repeat CT/possible drain injection.  Plans as per [facility], IR will continue to follow along. Please call with questions or concerns.  Electronically Signed: Joaquim Nam, PA-C 03/26/2021, 2:16 PM   I spent a total of 15 Minutes at the the patient's bedside AND on the patient's hospital floor or unit, greater than 50% of which was counseling/coordinating care for RLQ and LUQ drain follow up.

## 2021-03-26 NOTE — Progress Notes (Signed)
PHARMACY - TOTAL PARENTERAL NUTRITION CONSULT NOTE   Indication: Prolonged ileus  Patient Measurements: Weight: 84.8 kg (186 lb 15.2 oz)   Body mass index is 26.82 kg/m.   Assessment: 70 yo male with rectal cancer s/p xi robotic assisted lower anterior resection with end colostomy on 4/27 now with prolonged ileus to start TPN on 5/2.  Glucose / Insulin: Glucose continues to be at goal < 150 from bmet, not currently on SSI or CBG checks.   Electrolytes: Na WNL, K improved to 3.6, Mag and Phos remain WNL.  Ratio Cl:CO2 WNL. Renal: SCr and BUN stable, WNL Hepatic: AST/ALTWNL, tbili 0.1, alkphos 130 , TG 52 Albumin/prealbumin: alb 1.8, prealb 8.8 (5/16) up  Intake / Output; MIVF:  - I/O last 24hr net -68 mL - drain output inc to 310 ml  - colostomy output inc to 729mL stool - UOP 3300 mL - D5 1/4NS at [facility] per Md GI Imaging: - 5/11: New right abdominopelvic loculated ascites, suspicious for developing abscess or infected ascites GI Surgeries / Procedures:  - 4/27:  xi robotic assisted lower anterior resection with end colostomy on 4/27 - 5/6: NGT out and unable to be replaced thus far -5/12: CT guided placed of a 12 Fr drainage catheter placement into the right lower abdomen/pelvis yielding 75 cc of purulent appearing fluid  Central access: 5/2 TPN start date: 5/2  Nutritional Goals (per RD recommendation on 5/3):   2200-2400 kcal/day, protein 90-115 g/day, and >2L/day  Goal TPN rate is 90 mL/hr (provides 112 g of protein and 2199 kcals per day)   Current Nutrition:  - TPN - Full liquid diet - Boost Breeze daily (250kcal, 9g protein); Prosource Plus daily (100 kcal, 15g protein)  Plan:  At 18:00  Reduce to HALF rate TPN at 69mL/hr  Electrolytes in TPN:   Na 60 mEq/L  Increase K 50 mEq/L   Ca 46mEq/L  Mg 5mEq/L  Increase Phos 20 mmol/L  Ratio 1 Cl: 2 Ac   Add standard MVI and trace elements to TPN (change to PO when tolerating oral)  D5 1/4 NS at 10 ml/hr  per Md  TPN labs on Mon/Thurs   Gretta Arab PharmD, BCPS Clinical Pharmacist [facility] main pharmacy [phone] 03/26/2021 8:46 AM

## 2021-03-26 NOTE — Progress Notes (Signed)
Rectal carcinoma (HCC)  Subjective: No complaints this am.  Tolerating some fulls.  Sitting in the chair  Objective: Vital signs in last 24 hours: Temp:  [98.3 F (36.8 C)-98.6 F (37 C)] 98.5 F (36.9 C) (05/18 0616) Pulse Rate:  [105-114] 107 (05/18 0616) Resp:  [17-20] 18 (05/18 0616) BP: (158-170)/(71-75) 170/71 (05/18 0616) SpO2:  [99 %-100 %] 100 % (05/18 0616) Last BM Date:  (ostomy)  Intake/Output from previous day: 05/17 0701 - 05/18 0700 In: 4291.7 [P.O.:1200; I.V.:2526.1; IV Piggyback:535.6] Out: 4360 [Urine:3300; Drains:310; Stool:750] Intake/Output this shift: No intake/output data recorded.  General appearance: alert and cooperative GI: normal findings: soft, less TTP, no peritonitis, moderate distention Incision/Wound: clean, dry, intact JP: purulent Ostomy: bilious stool in bag LLQ drain: purulent output RLQ drain: clear ss fluid  Lab Results:  Results for orders placed or performed during the hospital encounter of 03/05/21 (from the past 24 hour(s))  Basic metabolic panel     Status: Abnormal   Collection Time: 03/26/21  3:14 AM  Result Value Ref Range   Sodium 140 135 - 145 mmol/L   Potassium 3.6 3.5 - 5.1 mmol/L   Chloride 107 98 - 111 mmol/L   CO2 28 22 - 32 mmol/L   Glucose, Bld 116 (H) 70 - 99 mg/dL   BUN 18 8 - 23 mg/dL   Creatinine, Ser 0.59 (L) 0.61 - 1.24 mg/dL   Calcium 8.8 (L) 8.9 - 10.3 mg/dL   GFR, Estimated >60 >60 mL/min   Anion gap 5 5 - 15  Magnesium     Status: None   Collection Time: 03/26/21  3:14 AM  Result Value Ref Range   Magnesium 2.0 1.7 - 2.4 mg/dL  Phosphorus     Status: None   Collection Time: 03/26/21  3:14 AM  Result Value Ref Range   Phosphorus 2.9 2.5 - 4.6 mg/dL     Studies/Results Radiology     MEDS, Scheduled . (feeding supplement) PROSource Plus  30 mL Oral Daily  . acetaminophen  1,000 mg Oral Q6H  . bacitracin   Topical BID  . chlorhexidine  15 mL Mouth Rinse BID  . Chlorhexidine Gluconate  Cloth  6 each Topical Daily  . feeding supplement  1 Container Oral Q24H  . lip balm  1 application Topical BID  . mouth rinse  15 mL Mouth Rinse q12n4p  . melatonin  3 mg Oral QHS  . pantoprazole  40 mg Intravenous Q12H  . sodium chloride flush  10-40 mL Intracatheter Q12H  . sodium chloride flush  5 mL Intracatheter Q8H  . sodium chloride flush  5 mL Intracatheter Q8H  . sucralfate  1 g Oral TID WC & HS     Assessment: Rectal carcinoma (HCC)  POD 20 LAR and diverting ostomy Post op abscess: IR drain in LLQ and RLQ, completed course of Zosyn and Eraxis.  Zosyn restarted for uncontrolled abd infection on 5/11 Hemorrhagic esophagitis  Plan: Cont TPN, fulls Acute blood loss anemia: Hgb stable, has had IV Fe and several transfusions ID: [id number] trending down, will cont Zosyn. Cont IR drains  RT for pulm toilet PT for ambulation IV and PO pain meds, attempt to wean IV dilaudid Cont Carafate, and IV PPI for esophagitis     LOS: 21 days    Rosario Adie, MD  [facility], [place]    03/26/2021 8:26 AM

## 2021-03-27 LAB — COMPREHENSIVE METABOLIC PANEL
ALT: 29 U/L (ref 0–44)
AST: 26 U/L (ref 15–41)
Albumin: 2 g/dL — ABNORMAL LOW (ref 3.5–5.0)
Alkaline Phosphatase: 138 U/L — ABNORMAL HIGH (ref 38–126)
Anion gap: 3 — ABNORMAL LOW (ref 5–15)
BUN: 16 mg/dL (ref 8–23)
CO2: 27 mmol/L (ref 22–32)
Calcium: 8.7 mg/dL — ABNORMAL LOW (ref 8.9–10.3)
Chloride: 107 mmol/L (ref 98–111)
Creatinine, Ser: 0.59 mg/dL — ABNORMAL LOW (ref 0.61–1.24)
GFR, Estimated: >60 mL/min (ref >60)
Glucose, Bld: 99 mg/dL (ref 70–99)
Potassium: 3.7 mmol/L (ref 3.5–5.1)
Sodium: 137 mmol/L (ref 135–145)
Total Bilirubin: 0.5 mg/dL (ref 0.3–1.2)
Total Protein: 6.8 g/dL (ref 6.5–8.1)

## 2021-03-27 LAB — PHOSPHORUS: Phosphorus: 3.3 mg/dL (ref 2.5–4.6)

## 2021-03-27 LAB — URINALYSIS, ROUTINE W REFLEX MICROSCOPIC
Bilirubin Urine: NEGATIVE
Glucose, UA: NEGATIVE mg/dL
Hgb urine dipstick: NEGATIVE
Ketones, ur: NEGATIVE mg/dL
Leukocytes,Ua: NEGATIVE
Nitrite: NEGATIVE
Protein, ur: NEGATIVE mg/dL
Specific Gravity, Urine: 1.018 (ref 1.005–1.030)
pH: 7 (ref 5.0–8.0)

## 2021-03-27 LAB — GLUCOSE, CAPILLARY
Glucose-Capillary: 128 mg/dL — ABNORMAL HIGH (ref 70–99)
Glucose-Capillary: 96 mg/dL (ref 70–99)
Glucose-Capillary: 96 mg/dL (ref 70–99)
Glucose-Capillary: 97 mg/dL (ref 70–99)

## 2021-03-27 LAB — CBC
HCT: 25.4 % — ABNORMAL LOW (ref 39.0–52.0)
Hemoglobin: 7.9 g/dL — ABNORMAL LOW (ref 13.0–17.0)
MCH: 27.4 pg (ref 26.0–34.0)
MCHC: 31.1 g/dL (ref 30.0–36.0)
MCV: 88.2 fL (ref 80.0–100.0)
Platelets: 636 10*3/uL — ABNORMAL HIGH (ref 150–400)
RBC: 2.88 MIL/uL — ABNORMAL LOW (ref 4.22–5.81)
RDW: 17.2 % — ABNORMAL HIGH (ref 11.5–15.5)
WBC: 17.3 10*3/uL — ABNORMAL HIGH (ref 4.0–10.5)
nRBC: 0.2 % (ref 0.0–0.2)

## 2021-03-27 LAB — MAGNESIUM: Magnesium: 1.8 mg/dL (ref 1.7–2.4)

## 2021-03-27 MED ORDER — TRAVASOL 10 % IV SOLN
INTRAVENOUS | Status: AC
  Filled 2021-03-27: qty 561.6

## 2021-03-27 NOTE — Progress Notes (Signed)
IR.  History of rectal cancer s/p robotic LAR with end colostomy in OR 4/49/7530; complicated by development of intra-abdominal fluid collection s/p LUQ drain placement in IR 0/03/1101; further complicated by development of additional intra-abdominal fluid collection s/p RLQ drain placement in IR 03/20/2021.  Received message from [place], [place] requesting RLQ drain evaluation/stat lock change (per RN partially saturated with probable output from prior drain/port site).  PA at bedside to assess. RLQ drain stat lock with minimal output of clear yellow/brown output- was sitting on top of prior drain/port site, partially unattached to skin. This was replaced with new stat lock (positioned lower so that is no longer on top of prior drain/port site). Terri, RN updated on stat lock change.  Please call IR with questions/concerns.   Bea Graff Isabellah Sobocinski, PA-C 03/27/2021, 2:58 PM

## 2021-03-27 NOTE — Progress Notes (Signed)
Bleeding stopped after a few minutes. No new orders given.

## 2021-03-27 NOTE — Progress Notes (Signed)
PHARMACY - TOTAL PARENTERAL NUTRITION CONSULT NOTE   Indication: Prolonged ileus  Patient Measurements: Weight: 84.8 kg (186 lb 15.2 oz)   Body mass index is 26.82 kg/m.   Assessment: 70 yo male with rectal cancer s/p xi robotic assisted lower anterior resection with end colostomy on 4/27 now with prolonged ileus to start TPN on 5/2.  Glucose / Insulin: Glucose continues to be at goal < 150 from bmet, not currently on SSI or CBG checks.   Electrolytes: Na WNL, K improved to 3.7, Mag and Phos remain WNL.  Ratio Cl:CO2 WNL. Renal: SCr and BUN stable, WNL Hepatic: AST/ALTWNL, tbili 0.5, alkphos 138, TG 52 (5/16) Albumin/prealbumin: alb 2, prealb 8.8 (5/16) up  Intake / Output; MIVF:  - I/O last 24hr net -1752 mL - drain output down to 55 ml  - colostomy output inc to 19mL stool - UOP 3100 mL - D5 1/4NS at [facility] per Md GI Imaging: - 5/11: New right abdominopelvic loculated ascites, suspicious for developing abscess or infected ascites GI Surgeries / Procedures:  - 4/27:  xi robotic assisted lower anterior resection with end colostomy on 4/27 - 5/6: NGT out and unable to be replaced thus far -5/12: CT guided placed of a 12 Fr drainage catheter placement into the right lower abdomen/pelvis yielding 75 cc of purulent appearing fluid  Central access: 5/2 TPN start date: 5/2  Nutritional Goals (per RD recommendation on 5/3):   2200-2400 kcal/day, protein 90-115 g/day, and >2L/day  Goal TPN rate is 90 mL/hr (provides 112 g of protein and 2199 kcals per day)   Current Nutrition:  - TPN - Full liquid diet - Boost Breeze daily (250kcal, 9g protein); Prosource Plus daily (100 kcal, 15g protein)  Plan:  At 18:00  Continue with half rate TPN at 70mL/hr  Electrolytes in TPN:   Na 60 mEq/L  K 50 mEq/L   Ca 37mEq/L  Increase Mg to 7.61mEq/L  Phos 20 mmol/L  Ratio 1 Cl: 2 Ac   Add standard MVI and trace elements to TPN (change to PO when tolerating oral)  D5 1/4 NS at 10  ml/hr per Md  TPN labs on Mon/Thurs  BMET, Mg, and Phos in AM   Ulice Dash D  03/27/2021 6:59 AM

## 2021-03-27 NOTE — Progress Notes (Signed)
Physical Therapy Treatment Patient Details Name: Shane [name] MRN: [id number] DOB: [birth year] Today's Date: 03/27/2021    History of Present Illness The patient is a 70 year old male who presents with colorectal cancer, in  ED 03/05/21 with complaints of hemorrhoids and rectal bleeding, anemia. CT showed a large rectal mass. S/P ROBOTIC ASSISTED LOWER ANTERIOR RESECTION WITH END COLOSTOMY  MOBILIZATION OF THE SPLENIC FLEXURE  on 03/05/21, post op tachycardia and hypotension and abscess-drains placed.    PT Comments    Pt ambulated in hallway and tolerated improved distance.  Pt hopeful for d/c home soon.   Follow Up Recommendations  Home health PT     Equipment Recommendations  Rolling walker with 5" wheels    Recommendations for Other Services       Precautions / Restrictions Precautions Precautions: Fall Precaution Comments: colostomy, scrotal edema, 3 drains    Mobility  Bed Mobility Overal bed mobility: Modified Independent                  Transfers Overall transfer level: Needs assistance Equipment used: Rolling walker (2 wheeled) Transfers: Sit to/from Stand Sit to Stand: Min guard         General transfer comment: cues for hand placement  Ambulation/Gait Ambulation/Gait assistance: Min guard;Supervision Gait Distance (Feet): 400 Feet Assistive device: Rolling walker (2 wheeled) Gait Pattern/deviations: Step-through pattern;Decreased stride length     General Gait Details: steady, no loss of balance, distance to tolerance   Stairs             Wheelchair Mobility    Modified Rankin (Stroke Patients Only)       Balance                                            Cognition Arousal/Alertness: Awake/alert Behavior During Therapy: WFL for tasks assessed/performed Overall Cognitive Status: Within Functional Limits for tasks assessed                                        Exercises      General  Comments        Pertinent Vitals/Pain Pain Assessment: No/denies pain    Home Living                      Prior Function            PT Goals (current goals can now be found in the care plan section) Progress towards PT goals: Progressing toward goals    Frequency    Min 3X/week      PT Plan Discharge plan needs to be updated    Co-evaluation              AM-PAC PT "6 Clicks" Mobility   Outcome Measure  Help needed turning from your back to your side while in a flat bed without using bedrails?: A Little Help needed moving from lying on your back to sitting on the side of a flat bed without using bedrails?: A Little Help needed moving to and from a bed to a chair (including a wheelchair)?: A Little Help needed standing up from a chair using your arms (e.g., wheelchair or bedside chair)?: A Little Help needed to walk in hospital room?: A Little Help needed  climbing 3-5 steps with a railing? : A Little 6 Click Score: 18    End of Session Equipment Utilized During Treatment: Gait belt Activity Tolerance: Patient tolerated treatment well Patient left: with call bell/phone within reach;in bed;with bed alarm set Nurse Communication: Mobility status PT Visit Diagnosis: Muscle weakness (generalized) (M62.81);Difficulty in walking, not elsewhere classified (R26.2)     Time: 5956-3875 PT Time Calculation (min) (ACUTE ONLY): 17 min  Charges:  $Gait Training: 8-22 mins                    Jannette Spanner PT, DPT Acute Rehabilitation Services Pager: [phone] Office: [phone]  York Ram E 03/27/2021, 2:40 PM

## 2021-03-27 NOTE — Progress Notes (Signed)
Pt was bleeding from his nose and spitting up blood. Pt did not report any new pain, no blood noted in colostomy bag.  PA was paged.

## 2021-03-27 NOTE — TOC Progression Note (Signed)
Transition of Care [facility]) - Progression Note   Patient Details  Name: Shane [name] MRN: [id number] Date of Birth: [birth year]  Transition of Care [facility]) CM/SW [place], LCSW Phone Number: 03/27/2021, 11:19 AM  Clinical Narrative: Patient was able to walk 200 feet with PT yesterday and now HHPT is being recommended. CSW met with patient to complete readmission assessment and discuss change in progress with PT.  Per patient, he resides at home with his wife. Prior to his hospitalization, he was independent with ADLs. Patient is able to afford his monthly medications, which he reports he takes as prescribed. Patient reported no previous history with [place] services and does not have any DME at home at this time. Patient transported himself to medical appointments.  Patient is agreeable to HHPT services as he feels he has progressed and wants to discharge home as soon as he is medically ready. Patient will need HHPT at discharge. TOC to follow.  Expected Discharge Plan: [place] Barriers to Discharge: Continued Medical Work up  Expected Discharge Plan and Services Expected Discharge Plan: [place] In-house Referral: Clinical Social Work Discharge Planning Services: CM Consult Post Acute Care Choice: [place] arrangements for the past 2 months: Single Family Home  Readmission Risk Interventions Readmission Risk Prevention Plan 03/27/2021  Transportation Screening Complete  HRI or Home Care Consult Complete  Social Work Consult for [place] Planning/Counseling Complete  Medication Review Press photographer) Complete  Some recent data might be hidden

## 2021-03-27 NOTE — Progress Notes (Signed)
Referring Physician(s): Gross, Remo Lipps ([place])  Supervising Physician: Mir, Sharen Heck  Patient Status:  [facility] - In-pt  Chief Complaint:  History of rectal cancer s/p robotic LAR with end colostomy in OR 05/18/6268; complicated by development of intra-abdominal fluid collection s/p LUQ drain placement in IR 02/14/5461; further complicated by development of additional intra-abdominal fluid collection s/p RLQ drain placement in IR 03/20/2021.  Subjective:  Patient laying in bed resting comfortably. He responds to voice. No complaints. LUQ and RLQ drain sites c/d/i.   Allergies: Nsaids  Medications: Prior to Admission medications   Medication Sig Start Date End Date Taking? Authorizing Provider  Diclofenac Sodium 2 % SOLN Place 2 g onto the skin 2 (two) times daily. Patient taking differently: Place 2 g onto the skin daily as needed (pain). 06/30/19  Yes Lyndal Pulley, DO  docusate sodium (COLACE) 100 MG capsule Take 100 mg by mouth daily as needed for mild constipation.    Yes [provider]  HYDROcodone-acetaminophen (NORCO/VICODIN) 5-325 MG tablet Take 1 tablet by mouth every 6 (six) hours as needed for moderate pain. 02/12/21  Yes Owens Shark, NP  hydrocortisone (ANUSOL-HC) 2.5 % rectal cream Place 1 application rectally 2 (two) times daily. Patient taking differently: Place 1 application rectally daily as needed for hemorrhoids or anal itching. 01/28/21  Yes Marrian Salvage, FNP  lidocaine-prilocaine (EMLA) cream Apply 1 application topically as needed. Patient taking differently: Apply 1 application topically as needed (prior to port use). 10/21/20  Yes Ladell Pier, MD  pravastatin (PRAVACHOL) 20 MG tablet TAKE 1 TABLET(20 MG) BY MOUTH DAILY Patient taking differently: Take 20 mg by mouth daily. 11/12/20  Yes Marrian Salvage, FNP  capecitabine (XELODA) 500 MG tablet TAKE 2000 MG (4 TABLETS) EVERY MORNING AND 1500 MG (3 TABLETS) EVERY EVENING FOR A TOTAL DAILY  DOSE OF 3500 MG ON RADIATION DAYS ONLY. Patient not taking: No sig reported 11/04/20 11/04/21  Ladell Pier, MD  diphenoxylate-atropine (LOMOTIL) 2.5-0.025 MG tablet Take 1-2 tablets by mouth 3 (three) times daily as needed for diarrhea or loose stools (Maxiumum of 6/day). Patient not taking: No sig reported 12/25/20   Ladell Pier, MD  ondansetron (ZOFRAN) 8 MG tablet Take 1 tablet (8 mg total) by mouth every 8 (eight) hours as needed for nausea or vomiting. Start 72 hours after each IV chemotherapy treatment 07/30/20   Ladell Pier, MD  prochlorperazine (COMPAZINE) 10 MG tablet Take 1 tablet (10 mg total) by mouth every 6 (six) hours as needed for nausea. 07/30/20   Ladell Pier, MD     Vital Signs: BP (!) 161/71 (BP Location: Left Arm)   Pulse (!) 110   Temp 97.8 F (36.6 C) (Oral)   Resp 18   Wt 186 lb 15.2 oz (84.8 kg)   SpO2 100%   BMI 26.82 kg/m   Physical Exam Vitals and nursing note reviewed.  Constitutional:      General: He is not in acute distress. Pulmonary:     Effort: Pulmonary effort is normal. No respiratory distress.  Abdominal:     Comments: (+) colostomy, surgical drain. LUQ and RLQ drain sites without tenderness, erythema, drainage, or active bleeding. LUQ drain with approximately 25 cc clear yellow fluid in gravity bag. RLQ drain with approximately 75 cc purulent fluid with debris in gravity bag.  Skin:    General: Skin is warm and dry.  Neurological:     Mental Status: He is alert.  Imaging: No results found.  Labs:  CBC: Recent Labs    03/23/21 0350 03/24/21 0340 03/25/21 0408 03/27/21 0354  WBC 17.4* 17.8* 16.3* 17.3*  HGB 8.5* 8.4* 8.3* 7.9*  HCT 26.8* 26.4* 26.4* 25.4*  PLT 668* 689* 704* 636*    COAGS: Recent Labs    03/20/21 0313  INR 1.3*    BMP: Recent Labs    07/04/20 0845 07/15/20 0048 08/13/20 0906 03/24/21 0340 03/25/21 0408 03/26/21 0314 03/27/21 0354  NA 138 136   < > 133* 138 140 137  K 3.8  3.7   < > 3.9 3.3* 3.6 3.7  CL 108 106   < > 103 105 107 107  CO2 25 21*   < > 23 27 28 27   GLUCOSE 105* 100*   < > 126* 121* 116* 99  BUN 12 21   < > 17 18 18 16   CALCIUM 9.9 9.2   < > 8.7* 8.8* 8.8* 8.7*  CREATININE 0.96 0.94   < > 0.71 0.69 0.59* 0.59*  GFRNONAA >60 >60   < > >60 >60 >60 >60  GFRAA >60 >60  --   --   --   --   --    < > = values in this interval not displayed.    LIVER FUNCTION TESTS: Recent Labs    03/17/21 0018 03/20/21 0313 03/24/21 0340 03/27/21 0354  BILITOT 0.6 0.2* 0.1* 0.5  AST 44* 38 29 26  ALT 36 33 30 29  ALKPHOS 78 104 130* 138*  PROT 6.2* 6.2* 6.9 6.8  ALBUMIN 2.0* 1.8* 2.0* 2.0*    Assessment and Plan:  History of rectal cancer s/p robotic LAR with end colostomy in OR 05/15/2375; complicated by development of intra-abdominal fluid collection s/p LUQ drain placement in IR 12/17/3149; further complicated by development of additional intra-abdominal fluid collection s/p RLQ drain placement in IR 03/20/2021. LUQ drain stable with approximately 25 cc clear yellow fluid in gravity bag. RLQ drain stable with approximately 75 cc purulent fluid with debris in gravity bag. Continue current drain management- continue with Qshift flushes/monitor of output. Plan for repeat CT/possible drain injection when output <10 cc/day (assess for possible removal). Further plans per CCS/TRH/oncology- appreciate and agree with management. IR to follow.   Electronically Signed: Earley Abide, PA-C 03/27/2021, 11:19 AM   I spent a total of 15 Minutes at the the patient's bedside AND on the patient's hospital floor or unit, greater than 50% of which was counseling/coordinating care for intra-abdominal fluid collections s/p drain placement x2.

## 2021-03-27 NOTE — Progress Notes (Signed)
Rectal carcinoma (HCC)  Subjective: No complaints this am.  Tolerating fulls.  Sitting in the chair.  Working well with PT  Objective: Vital signs in last 24 hours: Temp:  [97.8 F (36.6 C)-98.8 F (37.1 C)] 97.8 F (36.6 C) (05/19 0128) Pulse Rate:  [103-110] 110 (05/19 0128) Resp:  [18] 18 (05/19 0128) BP: (154-162)/(69-76) 161/71 (05/19 0128) SpO2:  [100 %] 100 % (05/19 0128) Last BM Date:  (Ostomy)  Intake/Output from previous day: 05/18 0701 - 05/19 0700 In: 1583.2 [P.O.:480; I.V.:974; IV Piggyback:114.2] Out: 3335 [Urine:3100; Drains:85; Stool:150] Intake/Output this shift: No intake/output data recorded.  General appearance: alert and cooperative GI: normal findings: soft, less TTP, no peritonitis, less distention Incision/Wound: clean, dry, intact JP: purulent Ostomy: bilious stool in bag LLQ drain: purulent output RLQ drain: cloudy SS fluid  Lab Results:  Results for orders placed or performed during the hospital encounter of 03/05/21 (from the past 24 hour(s))  Glucose, capillary     Status: Abnormal   Collection Time: 03/26/21 11:44 AM  Result Value Ref Range   Glucose-Capillary 128 (H) 70 - 99 mg/dL  Glucose, capillary     Status: Abnormal   Collection Time: 03/26/21  4:39 PM  Result Value Ref Range   Glucose-Capillary 101 (H) 70 - 99 mg/dL  Glucose, capillary     Status: None   Collection Time: 03/27/21 12:30 AM  Result Value Ref Range   Glucose-Capillary 96 70 - 99 mg/dL  Comprehensive metabolic panel     Status: Abnormal   Collection Time: 03/27/21  3:54 AM  Result Value Ref Range   Sodium 137 135 - 145 mmol/L   Potassium 3.7 3.5 - 5.1 mmol/L   Chloride 107 98 - 111 mmol/L   CO2 27 22 - 32 mmol/L   Glucose, Bld 99 70 - 99 mg/dL   BUN 16 8 - 23 mg/dL   Creatinine, Ser 0.59 (L) 0.61 - 1.24 mg/dL   Calcium 8.7 (L) 8.9 - 10.3 mg/dL   Total Protein 6.8 6.5 - 8.1 g/dL   Albumin 2.0 (L) 3.5 - 5.0 g/dL   AST 26 15 - 41 U/L   ALT 29 0 - 44 U/L    Alkaline Phosphatase 138 (H) 38 - 126 U/L   Total Bilirubin 0.5 0.3 - 1.2 mg/dL   GFR, Estimated >60 >60 mL/min   Anion gap 3 (L) 5 - 15  Magnesium     Status: None   Collection Time: 03/27/21  3:54 AM  Result Value Ref Range   Magnesium 1.8 1.7 - 2.4 mg/dL  Phosphorus     Status: None   Collection Time: 03/27/21  3:54 AM  Result Value Ref Range   Phosphorus 3.3 2.5 - 4.6 mg/dL  Glucose, capillary     Status: Abnormal   Collection Time: 03/27/21  5:24 AM  Result Value Ref Range   Glucose-Capillary 128 (H) 70 - 99 mg/dL     Studies/Results Radiology     MEDS, Scheduled . (feeding supplement) PROSource Plus  30 mL Oral Daily  . acetaminophen  1,000 mg Oral Q6H  . bacitracin   Topical BID  . chlorhexidine  15 mL Mouth Rinse BID  . Chlorhexidine Gluconate Cloth  6 each Topical Daily  . feeding supplement  1 Container Oral Q24H  . lip balm  1 application Topical BID  . mouth rinse  15 mL Mouth Rinse q12n4p  . melatonin  3 mg Oral QHS  . pantoprazole  40 mg Intravenous  Q12H  . sodium chloride flush  10-40 mL Intracatheter Q12H  . sodium chloride flush  5 mL Intracatheter Q8H  . sodium chloride flush  5 mL Intracatheter Q8H  . sucralfate  1 g Oral TID WC & HS     Assessment: Rectal carcinoma (HCC)  POD 20 LAR and diverting ostomy Post op abscess: IR drain in LLQ and RLQ, completed course of Zosyn and Eraxis.  Zosyn restarted for uncontrolled abd infection on 5/11 Hemorrhagic esophagitis  Plan: Cont 1/2 TPN, try soft diet, nutritional shakes between meals Acute blood loss anemia: Hgb stable, has had IV Fe and several transfusions ID: [id number] trending down, will cont Zosyn. Cont IR drains  RT for pulm toilet PT for ambulation IV and PO pain meds, attempt to wean IV dilaudid Cont Carafate, and IV PPI for esophagitis     LOS: 22 days    Rosario Adie, MD  [facility], [place]    03/27/2021 7:21 AM

## 2021-03-28 LAB — GLUCOSE, CAPILLARY
Glucose-Capillary: 102 mg/dL — ABNORMAL HIGH (ref 70–99)
Glucose-Capillary: 103 mg/dL — ABNORMAL HIGH (ref 70–99)
Glucose-Capillary: 111 mg/dL — ABNORMAL HIGH (ref 70–99)
Glucose-Capillary: 117 mg/dL — ABNORMAL HIGH (ref 70–99)

## 2021-03-28 LAB — CBC
HCT: 25.2 % — ABNORMAL LOW (ref 39.0–52.0)
Hemoglobin: 7.9 g/dL — ABNORMAL LOW (ref 13.0–17.0)
MCH: 27.3 pg (ref 26.0–34.0)
MCHC: 31.3 g/dL (ref 30.0–36.0)
MCV: 87.2 fL (ref 80.0–100.0)
Platelets: 618 10*3/uL — ABNORMAL HIGH (ref 150–400)
RBC: 2.89 MIL/uL — ABNORMAL LOW (ref 4.22–5.81)
RDW: 17.4 % — ABNORMAL HIGH (ref 11.5–15.5)
WBC: 19.5 10*3/uL — ABNORMAL HIGH (ref 4.0–10.5)
nRBC: 0.2 % (ref 0.0–0.2)

## 2021-03-28 LAB — BASIC METABOLIC PANEL
Anion gap: 4 — ABNORMAL LOW (ref 5–15)
BUN: 16 mg/dL (ref 8–23)
CO2: 26 mmol/L (ref 22–32)
Calcium: 8.4 mg/dL — ABNORMAL LOW (ref 8.9–10.3)
Chloride: 106 mmol/L (ref 98–111)
Creatinine, Ser: 0.68 mg/dL (ref 0.61–1.24)
GFR, Estimated: >60 mL/min (ref >60)
Glucose, Bld: 111 mg/dL — ABNORMAL HIGH (ref 70–99)
Potassium: 3.7 mmol/L (ref 3.5–5.1)
Sodium: 136 mmol/L (ref 135–145)

## 2021-03-28 LAB — MAGNESIUM: Magnesium: 1.8 mg/dL (ref 1.7–2.4)

## 2021-03-28 LAB — PHOSPHORUS: Phosphorus: 3.2 mg/dL (ref 2.5–4.6)

## 2021-03-28 MED ORDER — SODIUM CHLORIDE 0.9 % IV SOLN
3.0000 g | Freq: Four times a day (QID) | INTRAVENOUS | Status: DC
  Administered 2021-03-28 – 2021-04-10 (×51): 3 g via INTRAVENOUS
  Filled 2021-03-28 (×7): qty 3
  Filled 2021-03-28: qty 8
  Filled 2021-03-28 (×5): qty 3
  Filled 2021-03-28: qty 8
  Filled 2021-03-28 (×7): qty 3
  Filled 2021-03-28: qty 8
  Filled 2021-03-28 (×3): qty 3
  Filled 2021-03-28: qty 8
  Filled 2021-03-28 (×4): qty 3
  Filled 2021-03-28: qty 8
  Filled 2021-03-28 (×6): qty 3
  Filled 2021-03-28: qty 8
  Filled 2021-03-28 (×4): qty 3
  Filled 2021-03-28: qty 8
  Filled 2021-03-28: qty 3
  Filled 2021-03-28: qty 8
  Filled 2021-03-28 (×3): qty 3
  Filled 2021-03-28: qty 8
  Filled 2021-03-28 (×5): qty 3

## 2021-03-28 MED ORDER — HYDROMORPHONE HCL 1 MG/ML IJ SOLN
0.5000 mg | INTRAMUSCULAR | Status: DC | PRN
  Administered 2021-03-28 – 2021-03-29 (×6): 1 mg via INTRAVENOUS
  Administered 2021-03-29: 0.5 mg via INTRAVENOUS
  Administered 2021-03-29 – 2021-04-01 (×13): 1 mg via INTRAVENOUS
  Administered 2021-04-02: 0.5 mg via INTRAVENOUS
  Administered 2021-04-02 – 2021-04-09 (×18): 1 mg via INTRAVENOUS
  Filled 2021-03-28 (×39): qty 1

## 2021-03-28 MED ORDER — PANTOPRAZOLE SODIUM 40 MG PO TBEC
40.0000 mg | DELAYED_RELEASE_TABLET | Freq: Two times a day (BID) | ORAL | Status: DC
  Administered 2021-03-28 – 2021-04-11 (×29): 40 mg via ORAL
  Filled 2021-03-28 (×30): qty 1

## 2021-03-28 MED ORDER — TRAVASOL 10 % IV SOLN
INTRAVENOUS | Status: AC
  Filled 2021-03-28: qty 561.6

## 2021-03-28 MED ORDER — ADULT MULTIVITAMIN W/MINERALS CH
1.0000 | ORAL_TABLET | Freq: Every day | ORAL | Status: DC
  Administered 2021-03-29 – 2021-04-11 (×13): 1 via ORAL
  Filled 2021-03-28 (×14): qty 1

## 2021-03-28 NOTE — Progress Notes (Addendum)
Rectal carcinoma (HCC)  Subjective: No complaints this am.  Tolerating boost shakes and some soft foods.  Sitting in the chair.  Working well with PT.  Ambulating more each day.    Objective: Vital signs in last 24 hours: Temp:  [98.5 F (36.9 C)-99.7 F (37.6 C)] 98.5 F (36.9 C) (05/20 0606) Pulse Rate:  [97-110] 97 (05/20 0606) Resp:  [17-20] 17 (05/20 0606) BP: (152-158)/(68-85) 158/78 (05/20 0606) SpO2:  [98 %-100 %] 100 % (05/20 0606) Last BM Date:  (Ostomy)  Intake/Output from previous day: 05/19 0701 - 05/20 0700 In: 760 [P.O.:120; I.V.:580; IV Piggyback:50] Out: 2670 [Urine:2375; Drains:220; Stool:75] Intake/Output this shift: No intake/output data recorded.  General appearance: alert and cooperative GI: normal findings: soft, no TTP, no peritonitis, less distention Incision/Wound: clean, dry, intact JP: purulent Ostomy: bilious stool in bag LLQ drain: min purulent output RLQ drain: min cloudy SS fluid  Lab Results:  Results for orders placed or performed during the hospital encounter of 03/05/21 (from the past 24 hour(s))  Glucose, capillary     Status: None   Collection Time: 03/27/21 11:12 AM  Result Value Ref Range   Glucose-Capillary 96 70 - 99 mg/dL  Urinalysis, Routine w reflex microscopic     Status: None   Collection Time: 03/27/21  1:26 PM  Result Value Ref Range   Color, Urine YELLOW YELLOW   APPearance CLEAR CLEAR   Specific Gravity, Urine 1.018 1.005 - 1.030   pH 7.0 5.0 - 8.0   Glucose, UA NEGATIVE NEGATIVE mg/dL   Hgb urine dipstick NEGATIVE NEGATIVE   Bilirubin Urine NEGATIVE NEGATIVE   Ketones, ur NEGATIVE NEGATIVE mg/dL   Protein, ur NEGATIVE NEGATIVE mg/dL   Nitrite NEGATIVE NEGATIVE   Leukocytes,Ua NEGATIVE NEGATIVE  Glucose, capillary     Status: None   Collection Time: 03/27/21  5:45 PM  Result Value Ref Range   Glucose-Capillary 97 70 - 99 mg/dL  Glucose, capillary     Status: Abnormal   Collection Time: 03/28/21 12:11 AM   Result Value Ref Range   Glucose-Capillary 103 (H) 70 - 99 mg/dL  Basic metabolic panel     Status: Abnormal   Collection Time: 03/28/21  3:21 AM  Result Value Ref Range   Sodium 136 135 - 145 mmol/L   Potassium 3.7 3.5 - 5.1 mmol/L   Chloride 106 98 - 111 mmol/L   CO2 26 22 - 32 mmol/L   Glucose, Bld 111 (H) 70 - 99 mg/dL   BUN 16 8 - 23 mg/dL   Creatinine, Ser 0.68 0.61 - 1.24 mg/dL   Calcium 8.4 (L) 8.9 - 10.3 mg/dL   GFR, Estimated >60 >60 mL/min   Anion gap 4 (L) 5 - 15  Magnesium     Status: None   Collection Time: 03/28/21  3:21 AM  Result Value Ref Range   Magnesium 1.8 1.7 - 2.4 mg/dL  Phosphorus     Status: None   Collection Time: 03/28/21  3:21 AM  Result Value Ref Range   Phosphorus 3.2 2.5 - 4.6 mg/dL  Glucose, capillary     Status: Abnormal   Collection Time: 03/28/21  6:07 AM  Result Value Ref Range   Glucose-Capillary 111 (H) 70 - 99 mg/dL     Studies/Results Radiology     MEDS, Scheduled . (feeding supplement) PROSource Plus  30 mL Oral Daily  . acetaminophen  1,000 mg Oral Q6H  . bacitracin   Topical BID  . chlorhexidine  15  mL Mouth Rinse BID  . Chlorhexidine Gluconate Cloth  6 each Topical Daily  . feeding supplement  1 Container Oral Q24H  . lip balm  1 application Topical BID  . mouth rinse  15 mL Mouth Rinse q12n4p  . melatonin  3 mg Oral QHS  . pantoprazole  40 mg Intravenous Q12H  . sodium chloride flush  10-40 mL Intracatheter Q12H  . sodium chloride flush  5 mL Intracatheter Q8H  . sodium chloride flush  5 mL Intracatheter Q8H  . sucralfate  1 g Oral TID WC & HS     Assessment: Rectal carcinoma (HCC)  POD 20 LAR and diverting ostomy Post op abscess: IR drain in LLQ and RLQ, completed course of Zosyn and Eraxis.  Zosyn restarted for uncontrolled abd infection on 5/11.  Wbc remains elevated.   Hemorrhagic esophagitis, seems to be resolving  Plan: Cont 1/2 TPN, try soft diet, nutritional shakes between meals Acute blood loss  anemia: Hgb has been stable, will recheck today given episode of epistaxis yesterday. has had IV Fe and several transfusions ID: [id number] going up.  Switch to Unasyn per culture sensitivities. Cont IR drains.  UA normal.  If wbc up again tom, will need repeat CT   RT for pulm toilet, no signs for pulmonary infection PT for ambulation IV and PO pain meds, attempt to wean IV dilaudid Cont Carafate, and will switch to PO PPI for esophagitis     LOS: 23 days    Rosario Adie, MD  [facility], [place]    03/28/2021 7:41 AM

## 2021-03-28 NOTE — Progress Notes (Signed)
Referring Physician(s): Gross,S  Supervising Physician: Markus Daft  Patient Status:  [facility] - In-pt  Chief Complaint: Abdominal pain/abscesses   Subjective: Patient tolerating boost shakes ok; has some soreness at both right and left abdominal drain insertion sites; no nausea /vomiting   Allergies: Nsaids  Medications: Prior to Admission medications   Medication Sig Start Date End Date Taking? Authorizing Provider  Diclofenac Sodium 2 % SOLN Place 2 g onto the skin 2 (two) times daily. Patient taking differently: Place 2 g onto the skin daily as needed (pain). 06/30/19  Yes Lyndal Pulley, DO  docusate sodium (COLACE) 100 MG capsule Take 100 mg by mouth daily as needed for mild constipation.    Yes [provider]  HYDROcodone-acetaminophen (NORCO/VICODIN) 5-325 MG tablet Take 1 tablet by mouth every 6 (six) hours as needed for moderate pain. 02/12/21  Yes Owens Shark, NP  hydrocortisone (ANUSOL-HC) 2.5 % rectal cream Place 1 application rectally 2 (two) times daily. Patient taking differently: Place 1 application rectally daily as needed for hemorrhoids or anal itching. 01/28/21  Yes Marrian Salvage, FNP  lidocaine-prilocaine (EMLA) cream Apply 1 application topically as needed. Patient taking differently: Apply 1 application topically as needed (prior to port use). 10/21/20  Yes Ladell Pier, MD  pravastatin (PRAVACHOL) 20 MG tablet TAKE 1 TABLET(20 MG) BY MOUTH DAILY Patient taking differently: Take 20 mg by mouth daily. 11/12/20  Yes Marrian Salvage, FNP  capecitabine (XELODA) 500 MG tablet TAKE 2000 MG (4 TABLETS) EVERY MORNING AND 1500 MG (3 TABLETS) EVERY EVENING FOR A TOTAL DAILY DOSE OF 3500 MG ON RADIATION DAYS ONLY. Patient not taking: No sig reported 11/04/20 11/04/21  Ladell Pier, MD  diphenoxylate-atropine (LOMOTIL) 2.5-0.025 MG tablet Take 1-2 tablets by mouth 3 (three) times daily as needed for diarrhea or loose stools (Maxiumum of  6/day). Patient not taking: No sig reported 12/25/20   Ladell Pier, MD  ondansetron (ZOFRAN) 8 MG tablet Take 1 tablet (8 mg total) by mouth every 8 (eight) hours as needed for nausea or vomiting. Start 72 hours after each IV chemotherapy treatment 07/30/20   Ladell Pier, MD  prochlorperazine (COMPAZINE) 10 MG tablet Take 1 tablet (10 mg total) by mouth every 6 (six) hours as needed for nausea. 07/30/20   Ladell Pier, MD     Vital Signs: BP (!) 158/78 (BP Location: Left Arm)   Pulse 97   Temp 98.5 F (36.9 C) (Oral)   Resp 17   Wt 186 lb 15.2 oz (84.8 kg)   SpO2 100%   BMI 26.82 kg/m   Physical Exam awake, alert.  Right and left abdominal drains intact, insertion sites okay, mildly tender to palpation, output from right drain yesterday 50 cc, left drain 80 cc, today with minimal output recorded from both drains; drains irrigated without difficulty and with return of turbid beige-colored fluid with tissue fragments  Imaging: No results found.  Labs:  CBC: Recent Labs    03/24/21 0340 03/25/21 0408 03/27/21 0354 03/28/21 0812  WBC 17.8* 16.3* 17.3* 19.5*  HGB 8.4* 8.3* 7.9* 7.9*  HCT 26.4* 26.4* 25.4* 25.2*  PLT 689* 704* 636* 618*    COAGS: Recent Labs    03/20/21 0313  INR 1.3*    BMP: Recent Labs    07/04/20 0845 07/15/20 0048 08/13/20 0906 03/25/21 0408 03/26/21 0314 03/27/21 0354 03/28/21 0321  NA 138 136   < > 138 140 137 136  K 3.8  3.7   < > 3.3* 3.6 3.7 3.7  CL 108 106   < > 105 107 107 106  CO2 25 21*   < > 27 28 27 26   GLUCOSE 105* 100*   < > 121* 116* 99 111*  BUN 12 21   < > 18 18 16 16   CALCIUM 9.9 9.2   < > 8.8* 8.8* 8.7* 8.4*  CREATININE 0.96 0.94   < > 0.69 0.59* 0.59* 0.68  GFRNONAA >60 >60   < > >60 >60 >60 >60  GFRAA >60 >60  --   --   --   --   --    < > = values in this interval not displayed.    LIVER FUNCTION TESTS: Recent Labs    03/17/21 0018 03/20/21 0313 03/24/21 0340 03/27/21 0354  BILITOT 0.6 0.2* 0.1*  0.5  AST 44* 38 29 26  ALT 36 33 30 29  ALKPHOS 78 104 130* 138*  PROT 6.2* 6.2* 6.9 6.8  ALBUMIN 2.0* 1.8* 2.0* 2.0*    Assessment and Plan: Patient with history of rectal cancer, status post LAR with end colostomy;subsequently developed postop left upper quadrant abdabscess, s/pdrain placement on 5/3; status post drainage of right lower abdominal quadrant/pelvic abscess on 5/12;  most recent drain cultures growing rare Enterococcus ; afebrile, WBC 19.5 up from 17.3, hemoglobin 7.9(7.9), creat nl; continue to monitor drain outputs and WBC closely; as per surgery if WBC continues to climb recommend follow-up CT; may need drain injections prior to removal   Electronically Signed: D. Rowe Robert, PA-C 03/28/2021, 1:08 PM   I spent a total of 15 minutes at the the patient's bedside AND on the patient's hospital floor or unit, greater than 50% of which was counseling/coordinating care for right and left abdominal abscess drains    [name], male   DOB: August 25, 1951, 70 y.o.   MRN: [id number]

## 2021-03-28 NOTE — Progress Notes (Signed)
PHARMACY - TOTAL PARENTERAL NUTRITION CONSULT NOTE   Indication: Prolonged ileus  Patient Measurements: Weight: 84.8 kg (186 lb 15.2 oz)   Body mass index is 26.82 kg/m.   Assessment: 70 yo male with rectal cancer s/p xi robotic assisted lower anterior resection with end colostomy on 4/27 now with prolonged ileus to start TPN on 5/2.  Glucose / Insulin: Glucose continues to be at goal < 150 from bmet, not currently on SSI or CBG checks.   Electrolytes: Na, K, Mag and Phos remain WNL.  Ratio Cl:CO2 WNL. Renal: SCr and BUN stable, WNL Hepatic: AST/ALT and Tbili WNL, alkphos 138, TG 52 (5/16) Albumin/prealbumin: alb 2, prealb 8.8 (5/16) up  Intake / Output; MIVF:  - I/O last 24hr net -1930mL (charting of TPN volume inaccurate) - Drain output 277mL - Colostomy output decreased to 2mL stool - UOP 2375 mL - D5 1/4NS at [facility] per Md GI Imaging: - 5/11: New right abdominopelvic loculated ascites, suspicious for developing abscess or infected ascites GI Surgeries / Procedures:  - 4/27:  xi robotic assisted lower anterior resection with end colostomy on 4/27 - 5/6: NGT out and unable to be replaced thus far -5/12: CT guided placed of a 12 Fr drainage catheter placement into the right lower abdomen/pelvis yielding 75 cc of purulent appearing fluid  Central access: 5/2 TPN start date: 5/2  Nutritional Goals (per RD recommendation on 5/3):   2200-2400 kcal/day, protein 90-115 g/day, and >2L/day  Goal TPN rate is 90 mL/hr (provides 112 g of protein and 2199 kcals per day)  Current Nutrition:  - TPN - Soft diet, little intake per Dr. Marcello Moores - Boost Breeze daily (250kcal, 9g protein); Prosource Plus daily (100 kcal, 15g protein)  Plan:  At 18:00  Continue with half rate TPN at 26mL/hr  Electrolytes in TPN:   Na 60 mEq/L  K 50 mEq/L   Ca 7mEq/L  Mg 7.39mEq/L  Phos 20 mmol/L  Ratio 1 Cl: 2 Ac   Change standard MVI and trace elements to oral  D5 1/4 NS at 10 ml/hr per  Md  TPN labs on Mon/Thurs.  BMET, Mg, and Phos in AM  Gretta Arab PharmD, [place] Pharmacist [facility] main pharmacy [phone] 03/28/2021 7:39 AM

## 2021-03-29 LAB — CBC
HCT: 25 % — ABNORMAL LOW (ref 39.0–52.0)
Hemoglobin: 7.8 g/dL — ABNORMAL LOW (ref 13.0–17.0)
MCH: 27.1 pg (ref 26.0–34.0)
MCHC: 31.2 g/dL (ref 30.0–36.0)
MCV: 86.8 fL (ref 80.0–100.0)
Platelets: 551 10*3/uL — ABNORMAL HIGH (ref 150–400)
RBC: 2.88 MIL/uL — ABNORMAL LOW (ref 4.22–5.81)
RDW: 17.2 % — ABNORMAL HIGH (ref 11.5–15.5)
WBC: 19.3 10*3/uL — ABNORMAL HIGH (ref 4.0–10.5)
nRBC: 0.1 % (ref 0.0–0.2)

## 2021-03-29 LAB — GLUCOSE, CAPILLARY
Glucose-Capillary: 103 mg/dL — ABNORMAL HIGH (ref 70–99)
Glucose-Capillary: 112 mg/dL — ABNORMAL HIGH (ref 70–99)
Glucose-Capillary: 122 mg/dL — ABNORMAL HIGH (ref 70–99)
Glucose-Capillary: 132 mg/dL — ABNORMAL HIGH (ref 70–99)

## 2021-03-29 MED ORDER — TRAVASOL 10 % IV SOLN
INTRAVENOUS | Status: AC
  Filled 2021-03-29: qty 561.6

## 2021-03-29 NOTE — Progress Notes (Signed)
Assessment & Plan: POD#24 - XI ROBOTIC ASSISTED LOWER ANTERIOR RESECTION WITH MOBILIZATION OF THE SPLENIC FLEXURE  AND END COLOSTOMY - Dr. Leighton Ruff  06-22-4817  Post op abscess: IR drain in LLQ and RLQ  WBC remains elevated - Unasyn started yesterday - WBC 19.3 stable this AM    Hemorrhagic esophagitis, seems to be resolving  Cont 1/2 TPN, try soft diet, nutritional shakes between meals Acute blood loss anemia Unasyn, monitor WBC Cont IR drains RT for pulm toilet, no signs for pulmonary infection PT for ambulation IV and PO pain meds, attempt to wean IV dilaudid Cont Carafate, and will switch to PO PPI for esophagitis          Armandina Gemma, MD       [facility]       Office: [phone]   Chief Complaint: Rectal cancer  Subjective: Patient in bed, comfortable, no complaints.  Taking limited po diet.  Objective: Vital signs in last 24 hours: Temp:  [98.2 F (36.8 C)-99.5 F (37.5 C)] 98.3 F (36.8 C) (05/21 0619) Pulse Rate:  [102-106] 102 (05/21 0619) Resp:  [18] 18 (05/21 0619) BP: (142-160)/(68-76) 142/68 (05/21 0619) SpO2:  [97 %-99 %] 99 % (05/21 0619) Last BM Date:  (Ostomy)  Intake/Output from previous day: 05/20 0701 - 05/21 0700 In: 2866.9 [P.O.:1080; I.V.:1066.9; IV Piggyback:700] Out: 5655 [Urine:4650; Drains:205; Stool:800] Intake/Output this shift: No intake/output data recorded.  Physical Exam: HEENT - sclerae clear, mucous membranes moist Neck - soft Abdomen - soft, mild distension; bilateral abd drains with tan/brownish output Ext - no edema, non-tender Neuro - alert & oriented, no focal deficits  Lab Results:  Recent Labs    03/28/21 0812 03/29/21 0255  WBC 19.5* 19.3*  HGB 7.9* 7.8*  HCT 25.2* 25.0*  PLT 618* 551*   BMET Recent Labs    03/27/21 0354 03/28/21 0321  NA 137 136  K 3.7 3.7  CL 107 106  CO2 27 26  GLUCOSE 99 111*  BUN 16 16  CREATININE 0.59* 0.68  CALCIUM 8.7* 8.4*   PT/INR No  results for input(s): LABPROT, INR in the last 72 hours. Comprehensive Metabolic Panel:    Component Value Date/Time   NA 136 03/28/2021 0321   NA 137 03/27/2021 0354   K 3.7 03/28/2021 0321   K 3.7 03/27/2021 0354   CL 106 03/28/2021 0321   CL 107 03/27/2021 0354   CO2 26 03/28/2021 0321   CO2 27 03/27/2021 0354   BUN 16 03/28/2021 0321   BUN 16 03/27/2021 0354   CREATININE 0.68 03/28/2021 0321   CREATININE 0.59 (L) 03/27/2021 0354   CREATININE 0.88 01/10/2021 0934   CREATININE 0.79 12/25/2020 1000   CREATININE 0.97 06/24/2020 1417   GLUCOSE 111 (H) 03/28/2021 0321   GLUCOSE 99 03/27/2021 0354   CALCIUM 8.4 (L) 03/28/2021 0321   CALCIUM 8.7 (L) 03/27/2021 0354   AST 26 03/27/2021 0354   AST 29 03/24/2021 0340   AST 21 01/10/2021 0934   AST 21 12/25/2020 1000   ALT 29 03/27/2021 0354   ALT 30 03/24/2021 0340   ALT 12 01/10/2021 0934   ALT 15 12/25/2020 1000   ALKPHOS 138 (H) 03/27/2021 0354   ALKPHOS 130 (H) 03/24/2021 0340   BILITOT 0.5 03/27/2021 0354   BILITOT 0.1 (L) 03/24/2021 0340   BILITOT 0.7 01/10/2021 0934   BILITOT 0.5 12/25/2020 1000   PROT 6.8 03/27/2021 0354   PROT 6.9 03/24/2021 0340  ALBUMIN 2.0 (L) 03/27/2021 0354   ALBUMIN 2.0 (L) 03/24/2021 0340    Studies/Results: No results found.    Armandina Gemma 03/29/2021  [name], male   DOB: 07-20-1951, 70 y.o.   MRN: [id number]

## 2021-03-29 NOTE — Progress Notes (Addendum)
PHARMACY - TOTAL PARENTERAL NUTRITION CONSULT NOTE   Indication: Prolonged ileus  Patient Measurements: Weight: 84.8 kg (186 lb 15.2 oz)   Body mass index is 26.82 kg/m.   Assessment: 70 yo male with rectal cancer s/p xi robotic assisted lower anterior resection with end colostomy on 4/27. Developed post-op abscess and ileus. Pharmacy consulted to initiate TPN on 5/2.  Glucose / Insulin: Glucose continues to be at goal < 150 from bmet, not currently on SSI or CBG checks Electrolytes: No new labs; last labs on 5/20 Na, K, Mag and Phos remain WNL.  Ratio Cl:CO2 WNL. Renal: SCr and BUN stable, WNL Hepatic: AST/ALT and Tbili WNL, alkphos 138, TG 52 (5/16) Albumin/prealbumin: alb 2, prealb 8.8 (5/16) up  Intake / Output; MIVF:  - I/O last 24hr net -2777mL - Drain output 205 mL - Colostomy output increased to 800 mL stool - UOP 4650 mL - D5 1/4NS at [facility] per Md GI Imaging: - 5/11: New right abdominopelvic loculated ascites, suspicious for developing abscess or infected ascites GI Surgeries / Procedures:  - 4/27:  xi robotic assisted lower anterior resection with end colostomy on 4/27 - 5/6: NGT out and unable to be replaced thus far -5/12: CT guided placed of a 12 Fr drainage catheter placement into the right lower abdomen/pelvis yielding 75 cc of purulent appearing fluid  Central access: 5/2 TPN start date: 5/2  Nutritional Goals (per RD recommendation on 5/17):   2200-2400 kcal/day, protein 90-115 g/day, and >2L/day  Goal TPN rate is 90 mL/hr (provides 112 g of protein and 2199 kcals per day)  Current Nutrition:  - TPN at 1/2 of goal rate - Soft diet, little intake per Dr. Marcello Moores - Boost Breeze daily (250kcal, 9g protein); Prosource Plus daily (100 kcal, 15g protein)  Plan:  At 18:00  Continue with half rate TPN at 72mL/hr  Provides 162 g dextrose, 56 g protein, 1100 kcal  Electrolytes in TPN: No changes  Na 60 mEq/L  K 50 mEq/L   Ca 64mEq/L  Mg 7.64mEq/L  Phos  20 mmol/L  Ratio 1 Cl: 2 Ac   Standard MVI and trace elements ordered PO  D5 1/4 NS at 10 ml/hr per MD  TPN labs on Mon/Thurs. Check electrolytes with AM labs tomorrow  Lenis Noon, PharmD 03/29/21 9:14 AM

## 2021-03-30 ENCOUNTER — Inpatient Hospital Stay (HOSPITAL_COMMUNITY): Payer: Medicare Other

## 2021-03-30 LAB — CBC
HCT: 24.8 % — ABNORMAL LOW (ref 39.0–52.0)
Hemoglobin: 7.9 g/dL — ABNORMAL LOW (ref 13.0–17.0)
MCH: 27.5 pg (ref 26.0–34.0)
MCHC: 31.9 g/dL (ref 30.0–36.0)
MCV: 86.4 fL (ref 80.0–100.0)
Platelets: 539 10*3/uL — ABNORMAL HIGH (ref 150–400)
RBC: 2.87 MIL/uL — ABNORMAL LOW (ref 4.22–5.81)
RDW: 17.3 % — ABNORMAL HIGH (ref 11.5–15.5)
WBC: 22.3 10*3/uL — ABNORMAL HIGH (ref 4.0–10.5)
nRBC: 0 % (ref 0.0–0.2)

## 2021-03-30 LAB — MAGNESIUM: Magnesium: 1.9 mg/dL (ref 1.7–2.4)

## 2021-03-30 LAB — PHOSPHORUS: Phosphorus: 3.2 mg/dL (ref 2.5–4.6)

## 2021-03-30 LAB — BASIC METABOLIC PANEL
Anion gap: 5 (ref 5–15)
BUN: 13 mg/dL (ref 8–23)
CO2: 26 mmol/L (ref 22–32)
Calcium: 8.6 mg/dL — ABNORMAL LOW (ref 8.9–10.3)
Chloride: 104 mmol/L (ref 98–111)
Creatinine, Ser: 0.68 mg/dL (ref 0.61–1.24)
GFR, Estimated: >60 mL/min (ref >60)
Glucose, Bld: 108 mg/dL — ABNORMAL HIGH (ref 70–99)
Potassium: 3.7 mmol/L (ref 3.5–5.1)
Sodium: 135 mmol/L (ref 135–145)

## 2021-03-30 LAB — GLUCOSE, CAPILLARY
Glucose-Capillary: 104 mg/dL — ABNORMAL HIGH (ref 70–99)
Glucose-Capillary: 103 mg/dL — ABNORMAL HIGH (ref 70–99)

## 2021-03-30 MED ORDER — SODIUM CHLORIDE 0.9 % IV SOLN
25.0000 mg | Freq: Once | INTRAVENOUS | Status: AC
  Administered 2021-03-30: 25 mg via INTRAVENOUS
  Filled 2021-03-30: qty 1

## 2021-03-30 MED ORDER — TRAVASOL 10 % IV SOLN
INTRAVENOUS | Status: AC
  Filled 2021-03-30: qty 561.6

## 2021-03-30 MED ORDER — IOHEXOL 9 MG/ML PO SOLN
500.0000 mL | ORAL | Status: AC
  Administered 2021-03-30 (×2): 500 mL via ORAL

## 2021-03-30 NOTE — Progress Notes (Signed)
Assessment & Plan: POD#25 - XI ROBOTIC ASSISTED LOWER ANTERIOR RESECTION WITH MOBILIZATION OF THE SPLENIC FLEXURE AND END COLOSTOMY - Dr. Leighton Ruff  [phone]             Post op abscess: IR drains in LLQ and RLQ             WBC rising - 22.3 this AM on Unasyn - repeat CT abd ordered by Dr. Marcello Moores this morning  Continue TPN at 50%, limited po intake Acute blood loss anemia RT for pulm toilet PT for ambulation Continue Carafate,PPI for esophagitis         Armandina Gemma, MD       [facility]       Office: [phone]   Chief Complaint: Rectal cancer  Subjective: Patient in bed, mild discomfort across mid abdomen this AM.  Some nausea, emesis after energy drink yesterday.  Objective: Vital signs in last 24 hours: Temp:  [98.3 F (36.8 C)-98.9 F (37.2 C)] 98.3 F (36.8 C) (05/22 0513) Pulse Rate:  [93-105] 93 (05/22 0513) Resp:  [16-19] 16 (05/22 0513) BP: (132-151)/(63-67) 132/63 (05/22 0513) SpO2:  [99 %-100 %] 100 % (05/22 0513) Weight:  [81.7 kg] 81.7 kg (05/22 0513) Last BM Date: 03/30/21  Intake/Output from previous day: 05/21 0701 - 05/22 0700 In: 2321.7 [P.O.:630; I.V.:1271.6; IV Piggyback:400.1] Out: 2050 [MWNUU:7253; Drains:100] Intake/Output this shift: No intake/output data recorded.  Physical Exam: HEENT - sclerae clear, mucous membranes moist Neck - soft Abdomen - soft, small liquid stool in ostomy bag; drains with small thin tan output Neuro - alert & oriented, no focal deficits  Lab Results:  Recent Labs    03/29/21 0255 03/30/21 0455  WBC 19.3* 22.3*  HGB 7.8* 7.9*  HCT 25.0* 24.8*  PLT 551* 539*   BMET Recent Labs    03/28/21 0321 03/30/21 0455  NA 136 135  K 3.7 3.7  CL 106 104  CO2 26 26  GLUCOSE 111* 108*  BUN 16 13  CREATININE 0.68 0.68  CALCIUM 8.4* 8.6*   PT/INR No results for input(s): LABPROT, INR in the last 72 hours. Comprehensive Metabolic Panel:    Component Value Date/Time   NA  135 03/30/2021 0455   NA 136 03/28/2021 0321   K 3.7 03/30/2021 0455   K 3.7 03/28/2021 0321   CL 104 03/30/2021 0455   CL 106 03/28/2021 0321   CO2 26 03/30/2021 0455   CO2 26 03/28/2021 0321   BUN 13 03/30/2021 0455   BUN 16 03/28/2021 0321   CREATININE 0.68 03/30/2021 0455   CREATININE 0.68 03/28/2021 0321   CREATININE 0.88 01/10/2021 0934   CREATININE 0.79 12/25/2020 1000   CREATININE 0.97 06/24/2020 1417   GLUCOSE 108 (H) 03/30/2021 0455   GLUCOSE 111 (H) 03/28/2021 0321   CALCIUM 8.6 (L) 03/30/2021 0455   CALCIUM 8.4 (L) 03/28/2021 0321   AST 26 03/27/2021 0354   AST 29 03/24/2021 0340   AST 21 01/10/2021 0934   AST 21 12/25/2020 1000   ALT 29 03/27/2021 0354   ALT 30 03/24/2021 0340   ALT 12 01/10/2021 0934   ALT 15 12/25/2020 1000   ALKPHOS 138 (H) 03/27/2021 0354   ALKPHOS 130 (H) 03/24/2021 0340   BILITOT 0.5 03/27/2021 0354   BILITOT 0.1 (L) 03/24/2021 0340   BILITOT 0.7 01/10/2021 0934   BILITOT 0.5 12/25/2020 1000   PROT 6.8 03/27/2021 0354   PROT 6.9 03/24/2021 0340   ALBUMIN  2.0 (L) 03/27/2021 0354   ALBUMIN 2.0 (L) 03/24/2021 0340    Studies/Results: No results found.    Armandina Gemma 03/30/2021  [name], male   DOB: Jul 27, 1951, 70 y.o.   MRN: [id number]

## 2021-03-30 NOTE — Progress Notes (Signed)
PHARMACY - TOTAL PARENTERAL NUTRITION CONSULT NOTE   Indication: Prolonged ileus  Patient Measurements: Weight: 81.7 kg (180 lb 1.9 oz)   Body mass index is 25.84 kg/m.   Assessment: 70 yo male with rectal cancer s/p xi robotic assisted lower anterior resection with end colostomy on 4/27. Developed post-op abscess and ileus. Pharmacy consulted to initiate TPN on 5/2.  Glucose / Insulin: Glucose continues to be at goal < 180 from bmet, not currently on SSI or CBG checks Electrolytes: All lytes WNL including CorrCa (10.2); K (3.7) on lower end of normal Renal: SCr and BUN stable, WNL Hepatic: AST/ALT and Tbili WNL, alkphos 138, TG 52 (5/16) Albumin/prealbumin: alb 2, prealb 8.8 (5/16) up  Intake / Output; MIVF:  - I/O last 24hr net: +271 mL - Drain output 100 mL (decreasing) - Colostomy output: None charted/24 hrs - UOP 1950 mL - D5 1/4NS at [facility] per Md GI Imaging: - 5/11: New right abdominopelvic loculated ascites, suspicious for developing abscess or infected ascites -5/22: Repeat Abdominal CT ordered GI Surgeries / Procedures:  - 4/27:  xi robotic assisted lower anterior resection with end colostomy on 4/27 - 5/6: NGT out and unable to be replaced thus far -5/12: CT guided placed of a 12 Fr drainage catheter placement into the right lower abdomen/pelvis yielding 75 cc of purulent appearing fluid  Central access: 5/2 TPN start date: 5/2  Nutritional Goals (per RD recommendation on 5/17):   2200-2400 kcal/day, protein 90-115 g/day, and >2L/day  Goal TPN rate is 90 mL/hr (provides 112 g of protein and 2199 kcals per day)  Current Nutrition:  - TPN at 1/2 of goal rate - Soft diet, little intake per Dr. Marcello Moores; meal intake not charted - Boost Breeze daily (250kcal, 9g protein); Prosource Plus daily (100 kcal, 15g protein)  Plan:  At 18:00  Continue with half rate TPN at 63mL/hr  Provides 162 g dextrose, 56 g protein, 1100 kcal  Electrolytes in TPN: Slightly increase  K  Na 60 mEq/L  K 60 mEq/L   Ca 83mEq/L  Mg 7.2mEq/L  Phos 20 mmol/L  Ratio 1 Cl: 2 Ac   Standard MVI and trace elements ordered PO  D5 1/4 NS at 10 ml/hr per MD  TPN labs on Mon/Thurs.   Lenis Noon, PharmD 03/30/21 10:42 AM

## 2021-03-31 LAB — CBC
HCT: 23.7 % — ABNORMAL LOW (ref 39.0–52.0)
Hemoglobin: 7.6 g/dL — ABNORMAL LOW (ref 13.0–17.0)
MCH: 27.2 pg (ref 26.0–34.0)
MCHC: 32.1 g/dL (ref 30.0–36.0)
MCV: 84.9 fL (ref 80.0–100.0)
Platelets: 497 10*3/uL — ABNORMAL HIGH (ref 150–400)
RBC: 2.79 MIL/uL — ABNORMAL LOW (ref 4.22–5.81)
RDW: 17.5 % — ABNORMAL HIGH (ref 11.5–15.5)
WBC: 21.9 10*3/uL — ABNORMAL HIGH (ref 4.0–10.5)
nRBC: 0.1 % (ref 0.0–0.2)

## 2021-03-31 LAB — COMPREHENSIVE METABOLIC PANEL
ALT: 18 U/L (ref 0–44)
AST: 14 U/L — ABNORMAL LOW (ref 15–41)
Albumin: 2 g/dL — ABNORMAL LOW (ref 3.5–5.0)
Alkaline Phosphatase: 99 U/L (ref 38–126)
Anion gap: 6 (ref 5–15)
BUN: 16 mg/dL (ref 8–23)
CO2: 26 mmol/L (ref 22–32)
Calcium: 8.6 mg/dL — ABNORMAL LOW (ref 8.9–10.3)
Chloride: 102 mmol/L (ref 98–111)
Creatinine, Ser: 0.72 mg/dL (ref 0.61–1.24)
GFR, Estimated: >60 mL/min (ref >60)
Glucose, Bld: 113 mg/dL — ABNORMAL HIGH (ref 70–99)
Potassium: 4 mmol/L (ref 3.5–5.1)
Sodium: 134 mmol/L — ABNORMAL LOW (ref 135–145)
Total Bilirubin: 0.2 mg/dL — ABNORMAL LOW (ref 0.3–1.2)
Total Protein: 6.8 g/dL (ref 6.5–8.1)

## 2021-03-31 LAB — PREALBUMIN: Prealbumin: 8.3 mg/dL — ABNORMAL LOW (ref 18–38)

## 2021-03-31 LAB — PHOSPHORUS: Phosphorus: 3.4 mg/dL (ref 2.5–4.6)

## 2021-03-31 LAB — TRIGLYCERIDES: Triglycerides: 100 mg/dL (ref <150)

## 2021-03-31 LAB — MAGNESIUM: Magnesium: 1.8 mg/dL (ref 1.7–2.4)

## 2021-03-31 MED ORDER — CHLORPROMAZINE HCL 25 MG PO TABS
25.0000 mg | ORAL_TABLET | Freq: Three times a day (TID) | ORAL | Status: DC | PRN
  Administered 2021-03-31 – 2021-04-01 (×2): 25 mg via ORAL
  Filled 2021-03-31 (×5): qty 1

## 2021-03-31 MED ORDER — TRAVASOL 10 % IV SOLN
INTRAVENOUS | Status: AC
  Filled 2021-03-31: qty 561.6

## 2021-03-31 NOTE — Progress Notes (Addendum)
Rectal carcinoma (HCC)  Subjective: Feeling some lower abd pain.  Tolerating boost shakes and some soft foods.      Objective: Vital signs in last 24 hours: Temp:  [98.1 F (36.7 C)-99.4 F (37.4 C)] 98.9 F (37.2 C) (05/23 0531) Pulse Rate:  [102-108] 102 (05/23 0531) Resp:  [17-18] 18 (05/23 0531) BP: (141-155)/(68-70) 141/68 (05/23 0531) SpO2:  [99 %-100 %] 99 % (05/23 0531) Last BM Date: 03/30/21  Intake/Output from previous day: 05/22 0701 - 05/23 0700 In: 1628.8 [I.V.:1083.9; IV Piggyback:524.9] Out: 2545 [Urine:2275; Drains:145; Stool:125] Intake/Output this shift: No intake/output data recorded.  General appearance: alert and cooperative GI: normal findings: soft, no TTP, no peritonitis, less distention Incision/Wound: clean, dry, intact JP: purulent Ostomy: bilious stool in bag LLQ drain: min purulent output RLQ drain: min cloudy SS fluid  Lab Results:  Results for orders placed or performed during the hospital encounter of 03/05/21 (from the past 24 hour(s))  Glucose, capillary     Status: Abnormal   Collection Time: 03/30/21 11:26 AM  Result Value Ref Range   Glucose-Capillary 103 (H) 70 - 99 mg/dL  Prealbumin     Status: Abnormal   Collection Time: 03/31/21  5:01 AM  Result Value Ref Range   Prealbumin 8.3 (L) 18 - 38 mg/dL  Comprehensive metabolic panel     Status: Abnormal   Collection Time: 03/31/21  5:01 AM  Result Value Ref Range   Sodium 134 (L) 135 - 145 mmol/L   Potassium 4.0 3.5 - 5.1 mmol/L   Chloride 102 98 - 111 mmol/L   CO2 26 22 - 32 mmol/L   Glucose, Bld 113 (H) 70 - 99 mg/dL   BUN 16 8 - 23 mg/dL   Creatinine, Ser 0.72 0.61 - 1.24 mg/dL   Calcium 8.6 (L) 8.9 - 10.3 mg/dL   Total Protein 6.8 6.5 - 8.1 g/dL   Albumin 2.0 (L) 3.5 - 5.0 g/dL   AST 14 (L) 15 - 41 U/L   ALT 18 0 - 44 U/L   Alkaline Phosphatase 99 38 - 126 U/L   Total Bilirubin 0.2 (L) 0.3 - 1.2 mg/dL   GFR, Estimated >60 >60 mL/min   Anion gap 6 5 - 15  Magnesium      Status: None   Collection Time: 03/31/21  5:01 AM  Result Value Ref Range   Magnesium 1.8 1.7 - 2.4 mg/dL  Phosphorus     Status: None   Collection Time: 03/31/21  5:01 AM  Result Value Ref Range   Phosphorus 3.4 2.5 - 4.6 mg/dL  CBC     Status: Abnormal   Collection Time: 03/31/21  5:01 AM  Result Value Ref Range   WBC 21.9 (H) 4.0 - 10.5 K/uL   RBC 2.79 (L) 4.22 - 5.81 MIL/uL   Hemoglobin 7.6 (L) 13.0 - 17.0 g/dL   HCT 23.7 (L) 39.0 - 52.0 %   MCV 84.9 80.0 - 100.0 fL   MCH 27.2 26.0 - 34.0 pg   MCHC 32.1 30.0 - 36.0 g/dL   RDW 17.5 (H) 11.5 - 15.5 %   Platelets 497 (H) 150 - 400 K/uL   nRBC 0.1 0.0 - 0.2 %  Triglycerides     Status: None   Collection Time: 03/31/21  5:01 AM  Result Value Ref Range   Triglycerides 100 <150 mg/dL     Studies/Results Radiology     MEDS, Scheduled . (feeding supplement) PROSource Plus  30 mL Oral Daily  .  acetaminophen  1,000 mg Oral Q6H  . bacitracin   Topical BID  . chlorhexidine  15 mL Mouth Rinse BID  . Chlorhexidine Gluconate Cloth  6 each Topical Daily  . feeding supplement  1 Container Oral Q24H  . lip balm  1 application Topical BID  . mouth rinse  15 mL Mouth Rinse q12n4p  . melatonin  3 mg Oral QHS  . multivitamin with minerals  1 tablet Oral Daily  . pantoprazole  40 mg Oral BID AC  . sodium chloride flush  10-40 mL Intracatheter Q12H  . sodium chloride flush  5 mL Intracatheter Q8H  . sodium chloride flush  5 mL Intracatheter Q8H  . sucralfate  1 g Oral TID WC & HS     Assessment: Rectal carcinoma (HCC)  POD 20 LAR and diverting ostomy Post op abscess: IR drain in LLQ and RLQ, completed course of Zosyn and Eraxis.  Zosyn restarted for uncontrolled abd infection on 5/11.  Wbc remains elevated.   Hemorrhagic esophagitis, seems to be resolving  Plan: Cont 1/2 TPN, pt not eating much of his soft diet, nutritional shakes between meals Acute blood loss anemia: Hgb has been stable, will recheck today given episode of  epistaxis yesterday. has had IV Fe and several transfusions ID: wbc going up.  Switch to Unasyn per culture sensitivities. Cont IR drains.  UA normal. CT shows pelvic fluid collection that sits around pelvic drain.  Pelvic drain flushed today.  Will continue this TID.  If pt does not drain this collection with these measures, may need replacement or manipulation in IR   RT for pulm toilet, no signs for pulmonary infection PT for ambulation IV and PO pain meds, attempt to wean IV dilaudid Cont Carafate, and PO PPI for esophagitis     LOS: 26 days    Rosario Adie, MD  [facility], [place]    03/31/2021 8:39 AM

## 2021-03-31 NOTE — Progress Notes (Signed)
PT Cancellation Note  Patient Details Name: Shane [name] MRN: [id number] DOB: 22-Aug-1951   Cancelled Treatment:    Reason Eval/Treat Not Completed: Fatigue/lethargy limiting ability to participate (pt sleeping soundly, did not arouse to verbal stimulii. Will check back tomorrow.)   Philomena Doheny PT 03/31/2021  Acute Rehabilitation Services Pager [phone] Office [phone]

## 2021-03-31 NOTE — Progress Notes (Addendum)
Referring Physician(s): Gross,S  Supervising Physician: Daryll Brod  Patient Status:  [facility] - In-pt  Chief Complaint: Abdominal pain/abscesses   Subjective: Patient doing fairly well this afternoon.  Has some mild mid abdominal discomfort but feels better since surgical drain was vigorously flushed this morning; denies nausea or vomiting.   Allergies: Nsaids  Medications: Prior to Admission medications   Medication Sig Start Date End Date Taking? Authorizing Provider  Diclofenac Sodium 2 % SOLN Place 2 g onto the skin 2 (two) times daily. Patient taking differently: Place 2 g onto the skin daily as needed (pain). 06/30/19  Yes Lyndal Pulley, DO  docusate sodium (COLACE) 100 MG capsule Take 100 mg by mouth daily as needed for mild constipation.    Yes [provider]  HYDROcodone-acetaminophen (NORCO/VICODIN) 5-325 MG tablet Take 1 tablet by mouth every 6 (six) hours as needed for moderate pain. 02/12/21  Yes Owens Shark, NP  hydrocortisone (ANUSOL-HC) 2.5 % rectal cream Place 1 application rectally 2 (two) times daily. Patient taking differently: Place 1 application rectally daily as needed for hemorrhoids or anal itching. 01/28/21  Yes Marrian Salvage, FNP  lidocaine-prilocaine (EMLA) cream Apply 1 application topically as needed. Patient taking differently: Apply 1 application topically as needed (prior to port use). 10/21/20  Yes Ladell Pier, MD  pravastatin (PRAVACHOL) 20 MG tablet TAKE 1 TABLET(20 MG) BY MOUTH DAILY Patient taking differently: Take 20 mg by mouth daily. 11/12/20  Yes Marrian Salvage, FNP  capecitabine (XELODA) 500 MG tablet TAKE 2000 MG (4 TABLETS) EVERY MORNING AND 1500 MG (3 TABLETS) EVERY EVENING FOR A TOTAL DAILY DOSE OF 3500 MG ON RADIATION DAYS ONLY. Patient not taking: No sig reported 11/04/20 11/04/21  Ladell Pier, MD  diphenoxylate-atropine (LOMOTIL) 2.5-0.025 MG tablet Take 1-2 tablets by mouth 3 (three) times  daily as needed for diarrhea or loose stools (Maxiumum of 6/day). Patient not taking: No sig reported 12/25/20   Ladell Pier, MD  ondansetron (ZOFRAN) 8 MG tablet Take 1 tablet (8 mg total) by mouth every 8 (eight) hours as needed for nausea or vomiting. Start 72 hours after each IV chemotherapy treatment 07/30/20   Ladell Pier, MD  prochlorperazine (COMPAZINE) 10 MG tablet Take 1 tablet (10 mg total) by mouth every 6 (six) hours as needed for nausea. 07/30/20   Ladell Pier, MD     Vital Signs: BP (!) 141/68 (BP Location: Left Arm)   Pulse (!) 102   Temp 98.9 F (37.2 C) (Oral)   Resp 18   Wt 180 lb 1.9 oz (81.7 kg)   SpO2 99%   BMI 25.84 kg/m   Physical Exam awake, alert.  Right and left abdominal drains intact, insertion sites okay, both drains irrigated without difficulty, output from right drain 25 cc, left drain 65 cc  Imaging: CT ABDOMEN PELVIS WO CONTRAST  Result Date: 03/30/2021 CLINICAL DATA:  History of rectal cancer. Abdominal pain. Status post drainage of abscess. EXAM: CT ABDOMEN AND PELVIS WITHOUT CONTRAST TECHNIQUE: Multidetector CT imaging of the abdomen and pelvis was performed following the standard protocol without IV contrast. COMPARISON:  Mar 19, 2021. FINDINGS: Lower chest: Bilateral pleural effusions are again noted with adjacent subsegmental atelectasis of both lower lobes. Hepatobiliary: No gallstones or biliary dilatation is noted. 5.6 cm subcapsular fluid collection is noted posteriorly along the right hepatic lobe which is slightly decreased compared to prior exam. Pancreas: Unremarkable. No pancreatic ductal dilatation or surrounding inflammatory changes.  Spleen: Normal in size without focal abnormality. Adrenals/Urinary Tract: Adrenal glands are unremarkable. Hydronephrosis noted on prior exam appears to have resolved. Probable cyst is noted in lower pole of right kidney. Urinary bladder is decompressed secondary to Foley catheter. Stomach/Bowel:  Stable wall and fold thickening is seen involving the visualized distal esophagus and stomach concerning for inflammation. Stable colostomy seen in left lower quadrant. Mildly dilated proximal small bowel loops are noted concerning for ileus. Vascular/Lymphatic: Aortic atherosclerosis. No enlarged abdominal or pelvic lymph nodes. Reproductive: Prostate is unremarkable. Bilateral hydroceles are again noted. Other: 8.7 x 5.9 cm fluid collection with some gas is noted in the posterior pelvis with surgical drain present. This is concerning for abscess. Continued presence of percutaneous drainage catheter in fluid collection in left pericolic gutter. This collection appears to be smaller compared to prior exam. There is been interval placement of new drainage catheter into the right lower quadrant. No significant residual fluid collection is noted. Mild anasarca is noted. Musculoskeletal: No acute or significant osseous findings. IMPRESSION: Bilateral pleural effusions are again noted with adjacent subsegmental atelectasis. 5.6 cm subcapsular fluid collection is noted posteriorly along the right hepatic lobe which is slightly decreased compared to prior exam. 8.7 x 5.9 cm gas and fluid collection is noted in the posterior cul-de-sac with surgical drain present. This is slightly enlarged compared to prior exam and is concerning for abscess. Continued presence of percutaneous drainage catheter seen in fluid collection in left pericolic gutter superiorly. This collection appears to be smaller compared to prior exam. Interval placement of new percutaneous drainage catheter into the right lower quadrant, with no significant residual fluid collection noted. Bilateral hydronephrosis noted on prior exam appears to have resolved status post decompression of urinary bladder secondary to Foley catheter. Bilateral hydroceles are again noted. Mildly dilated proximal small bowel loops are noted most consistent with ileus. Continued  presence of distal esophageal and gastric wall thickening is noted concerning for gastritis and esophagitis. Electronically Signed   By: Marijo Conception M.D.   On: 03/30/2021 16:38    Labs:  CBC: Recent Labs    03/28/21 0812 03/29/21 0255 03/30/21 0455 03/31/21 0501  WBC 19.5* 19.3* 22.3* 21.9*  HGB 7.9* 7.8* 7.9* 7.6*  HCT 25.2* 25.0* 24.8* 23.7*  PLT 618* 551* 539* 497*    COAGS: Recent Labs    03/20/21 0313  INR 1.3*    BMP: Recent Labs    07/04/20 0845 07/15/20 0048 08/13/20 0906 03/27/21 0354 03/28/21 0321 03/30/21 0455 03/31/21 0501  NA 138 136   < > 137 136 135 134*  K 3.8 3.7   < > 3.7 3.7 3.7 4.0  CL 108 106   < > 107 106 104 102  CO2 25 21*   < > 27 26 26 26   GLUCOSE 105* 100*   < > 99 111* 108* 113*  BUN 12 21   < > 16 16 13 16   CALCIUM 9.9 9.2   < > 8.7* 8.4* 8.6* 8.6*  CREATININE 0.96 0.94   < > 0.59* 0.68 0.68 0.72  GFRNONAA >60 >60   < > >60 >60 >60 >60  GFRAA >60 >60  --   --   --   --   --    < > = values in this interval not displayed.    LIVER FUNCTION TESTS: Recent Labs    03/20/21 0313 03/24/21 0340 03/27/21 0354 03/31/21 0501  BILITOT 0.2* 0.1* 0.5 0.2*  AST 38 29 26  14*  ALT 33 30 29 18   ALKPHOS 104 130* 138* 99  PROT 6.2* 6.9 6.8 6.8  ALBUMIN 1.8* 2.0* 2.0* 2.0*    Assessment and Plan: Patient with history of rectal cancer, status post LAR with end colostomy;subsequently developed postop left upper quadrant abdabscess, s/pdrain placementon 5/3; statuspost drainage of right lower abdominal quadrant/pelvic abscess on 5/12; most recent drain cultures growing rare Enterococcus ; afebrile, 21.9 down from 22.3, hemoglobin 7.6 down from 7.9, creatinine normal; f/u CT A/P yesterday revealed:  Bilateral pleural effusions are again noted with adjacent subsegmental atelectasis.  5.6 cm subcapsular fluid collection is noted posteriorly along the right hepatic lobe which is slightly decreased compared to prior exam.  8.7 x 5.9  cm gas and fluid collection is noted in the posterior cul-de-sac with surgical drain present. This is slightly enlarged compared to prior exam and is concerning for abscess.  Continued presence of percutaneous drainage catheter seen in fluid collection in left pericolic gutter superiorly. This collection appears to be smaller compared to prior exam.  Interval placement of new percutaneous drainage catheter into the right lower quadrant, with no significant residual fluid collection noted.  Bilateral hydronephrosis noted on prior exam appears to have resolved status post decompression of urinary bladder secondary to Foley catheter.  Bilateral hydroceles are again noted.  Mildly dilated proximal small bowel loops are noted most consistent with ileus.  Continued presence of distal esophageal and gastric wall thickening is noted concerning for gastritis and esophagitis.  Continue current treatment, including irrigation of surgical drain tid; as per surgery if this fails to improve output may need surgical drain replacement or manipulation; consider drain injections before removal   Electronically Signed: D. Rowe Robert, PA-C 03/31/2021, 12:28 PM   I spent a total of 15 minutes at the the patient's bedside AND on the patient's hospital floor or unit, greater than 50% of which was counseling/coordinating care for right and left abdominal abscess drains    [name], male   DOB: Sep 08, 1951, 70 y.o.   MRN: [id number]

## 2021-03-31 NOTE — Progress Notes (Signed)
PHARMACY - TOTAL PARENTERAL NUTRITION CONSULT NOTE   Indication: Prolonged ileus  Patient Measurements: Weight: 81.7 kg (180 lb 1.9 oz)   Body mass index is 25.84 kg/m.   Assessment: 70 yo male with rectal cancer s/p xi robotic assisted lower anterior resection with end colostomy on 4/27. Developed post-op abscess and ileus. Pharmacy consulted to initiate TPN on 5/2.  Glucose / Insulin: Glucose continues to be at goal < 180 from bmet, not currently on SSI or CBG checks Electrolytes: All lytes WNL including CorrCa (10.2) Renal: SCr and BUN stable, WNL Hepatic: AST/ALT and Tbili WNLTG 100 (5/23) Albumin/prealbumin: alb 2, prealb 8.3 (5/23) down Intake / Output; MIVF:  - I/O last 24hr net: -916 mL - Drain output 145 mL  - Colostomy output: 125 ml - UOP 1275 mL - D5 1/4NS at [facility] per Md GI Imaging: - 5/11: New right abdominopelvic loculated ascites, suspicious for developing abscess or infected ascites -5/22: Repeat Abdominal CT ordered GI Surgeries / Procedures:  - 4/27:  xi robotic assisted lower anterior resection with end colostomy on 4/27 - 5/6: NGT out and unable to be replaced thus far -5/12: CT guided placed of a 12 Fr drainage catheter placement into the right lower abdomen/pelvis yielding 75 cc of purulent appearing fluid  Central access: 5/2 TPN start date: 5/2  Nutritional Goals (per RD recommendation on 5/17):   2200-2400 kcal/day, protein 90-115 g/day, and >2L/day  Goal TPN rate is 90 mL/hr (provides 112 g of protein and 2199 kcals per day)  Current Nutrition:  - TPN at 1/2 of goal rate - Soft diet, little intake per Dr. Marcello Moores; meal intake not charted - Boost Breeze daily (250kcal, 9g protein); Prosource Plus daily (100 kcal, 15g protein)  Plan:  At 18:00  Continue with half rate TPN at 29mL/hr  Provides 162 g dextrose, 56 g protein, 1100 kcal  Electrolytes in TPN:   Na 60 mEq/L  K 60 mEq/L   Ca 71mEq/L  Mg 7.38mEq/L  Phos 20 mmol/L  Ratio 1  Cl: 2 Ac   Standard MVI and trace elements ordered PO  D5 1/4 NS at 10 ml/hr per MD  TPN labs on Mon/Thurs.   Napoleon Form, PharmD 03/31/21 7:15 AM

## 2021-03-31 NOTE — Consult Note (Signed)
[facility] Nurse ostomy follow up Stoma type/location: LUQ colostomy Stomal assessment/size: 1 and 1/4 inch x 1 and 5/8 inch oval stoma with deep creases at 3 and 9 o'clock Peristomal assessment: Small areas of mucocutaneous separation at 3 and 9 o'clock Treatment options for stomal/peristomal skin: skin barrier ring Output: brown soft stool Ostomy pouching: 1pc.flat with skin barrier ring Kellie Simmering # [phone], pouch.  Skin barrier ring is Kellie Simmering # [phone]) Education provided: None today.  Patient with incapacitating hiccoughs.  Drain stopped working over weekend, now functioning. Enrolled patient in [place] Discharge program: Yes, previously  Daughters have been instructed in ostomy care. One is a CNA.  Today I restocked the patient's supply: there are 4 pouches, a pattern and 4 skin barrier rings as well as a bottle of stoma powder in a bad in the patient's over-the-sink cupboard.  [facility] nursing team will continue to follow, seeing patient every week and will remain available to this patient, the nursing and medical teams.   Thanks, Maudie Flakes, MSN, RN, [facility], Arther Abbott  Pager# [phone]

## 2021-04-01 LAB — GLUCOSE, CAPILLARY: Glucose-Capillary: 122 mg/dL — ABNORMAL HIGH (ref 70–99)

## 2021-04-01 MED ORDER — BOOST / RESOURCE BREEZE PO LIQD CUSTOM
1.0000 | Freq: Two times a day (BID) | ORAL | Status: DC
  Administered 2021-04-01 – 2021-04-08 (×10): 1 via ORAL

## 2021-04-01 MED ORDER — POTASSIUM ACETATE 2 MEQ/ML IV SOLN
INTRAVENOUS | Status: AC
  Filled 2021-04-01: qty 561.6

## 2021-04-01 NOTE — Progress Notes (Signed)
PHARMACY - TOTAL PARENTERAL NUTRITION CONSULT NOTE   Indication: Prolonged ileus  Patient Measurements: Weight: 77.6 kg (171 lb)   Body mass index is 24.54 kg/m.   Assessment: 70 yo male with rectal cancer s/p xi robotic assisted lower anterior resection with end colostomy on 4/27. Developed post-op abscess and ileus. Pharmacy consulted to initiate TPN on 5/2.  Glucose / Insulin: Glucose continues to be at goal < 180 from bmet, not currently on SSI or CBG checks Electrolytes: All lytes WNL except Na slightly low at 134; CorrCa (10.2) Renal: SCr and BUN stable, WNL Hepatic: AST/ALT and Tbili WNLTG 100 (5/23) Albumin/prealbumin: alb 2, prealb 8.3 (5/23) down Intake / Output; MIVF:  - I/O last 24hr net: +1023 ml - Drain output 120 mL  - Colostomy output: 110 ml - UOP 2200 mL - D5 1/4NS at [facility] per Md GI Imaging: - 5/11: New right abdominopelvic loculated ascites, suspicious for developing abscess or infected ascites -5/22: Repeat Abdominal CT ordered GI Surgeries / Procedures:  - 4/27:  xi robotic assisted lower anterior resection with end colostomy on 4/27 - 5/6: NGT out and unable to be replaced thus far -5/12: CT guided placed of a 12 Fr drainage catheter placement into the right lower abdomen/pelvis yielding 75 cc of purulent appearing fluid  Central access: 5/2 TPN start date: 5/2  Nutritional Goals (per RD recommendation on 5/17):   2200-2400 kcal/day, protein 90-115 g/day, and >2L/day  Goal TPN rate is 90 mL/hr (provides 112 g of protein and 2199 kcals per day)  Current Nutrition:  - TPN at 1/2 of goal rate - Soft diet, little intake per Dr. Marcello Moores; meal intake not charted - Boost Breeze daily (250kcal, 9g protein); Prosource Plus daily (100 kcal, 15g protein)  Plan:  At 18:00  Continue with half rate TPN at 80mL/hr  Provides 162 g dextrose, 56 g protein, 1100 kcal  Electrolytes in TPN:   Na 60 mEq/L  K 60 mEq/L   Ca 56mEq/L  Mg 7.28mEq/L  Phos 20  mmol/L  Ratio 1 Cl: 2 Ac   Standard MVI and trace elements ordered PO  D5 1/4 NS at 10 ml/hr per MD  TPN labs on Mon/Thurs.   Napoleon Form 04/01/21 8:39 AM

## 2021-04-01 NOTE — Progress Notes (Signed)
Rectal carcinoma ([place])  Subjective: Feeling somewhat better.  Tolerating boost shakes and some soft foods.  Unable to tolerate Carafate without vomiting      Objective: Vital signs in last 24 hours: Temp:  [98.6 F (37 C)-99.6 F (37.6 C)] 98.6 F (37 C) (05/24 0635) Pulse Rate:  [100-110] 104 (05/24 0635) Resp:  [18] 18 (05/24 0635) BP: (136-153)/(61-64) 136/62 (05/24 0635) SpO2:  [98 %-100 %] 100 % (05/24 0635) Weight:  [77.6 kg] 77.6 kg (05/23 1705) Last BM Date: 03/31/21  Intake/Output from previous day: 05/23 0701 - 05/24 0700 In: 3453.1 [P.O.:840; I.V.:1523.1; IV Piggyback:380] Out: 2025 [Urine:2200; Drains:120; Stool:110] Intake/Output this shift: Total I/O In: 30 [Other:30] Out: 40 [Drains:40]  General appearance: alert and cooperative GI: normal findings: soft, no TTP, no peritonitis, less distention Incision/Wound: clean, dry, intact JP: purulent Ostomy: stool in bag LLQ drain: min purulent output RLQ drain: min cloudy SS fluid  Lab Results:  No results found for this or any previous visit (from the past 24 hour(s)).   Studies/Results Radiology     MEDS, Scheduled . (feeding supplement) PROSource Plus  30 mL Oral Daily  . acetaminophen  1,000 mg Oral Q6H  . bacitracin   Topical BID  . chlorhexidine  15 mL Mouth Rinse BID  . Chlorhexidine Gluconate Cloth  6 each Topical Daily  . feeding supplement  1 Container Oral Q24H  . lip balm  1 application Topical BID  . mouth rinse  15 mL Mouth Rinse q12n4p  . melatonin  3 mg Oral QHS  . multivitamin with minerals  1 tablet Oral Daily  . pantoprazole  40 mg Oral BID AC  . sodium chloride flush  10-40 mL Intracatheter Q12H  . sodium chloride flush  5 mL Intracatheter Q8H  . sodium chloride flush  5 mL Intracatheter Q8H  . sucralfate  1 g Oral TID WC & HS     Assessment: Rectal carcinoma (HCC)  POD 20 LAR and diverting ostomy Post op abscess: IR drain in LLQ and RLQ, completed course of Zosyn and  Eraxis.  Zosyn restarted for uncontrolled abd infection on 5/11.  Wbc remains elevated.   Hemorrhagic esophagitis, seems to be resolving  Plan: Cont 1/2 TPN, pt not eating much of his soft diet, nutritional shakes between meals Acute blood loss anemia: Hgb has been stable, has had IV Fe and several transfusions ID:  Switched to Unasyn per culture sensitivities. Cont IR drains.  UA normal. CT shows pelvic fluid collection that sits around pelvic drain.  Pelvic drain flushed TID.  Will repeat CBC in AM.  If pt does not drain this collection with these measures, may need replacement or manipulation in IR   RT for pulm toilet, no signs for pulmonary infection PT for ambulation IV and PO pain meds, attempt to wean IV dilaudid D/c Carafate, and cont PO PPI for esophagitis     LOS: 27 days    Rosario Adie, MD  [facility], [place]    04/01/2021 8:33 AM

## 2021-04-01 NOTE — Progress Notes (Signed)
Physical Therapy Treatment Patient Details Name: Shane [name] MRN: [id number] DOB: [birth year] Today's Date: 04/01/2021    History of Present Illness The patient is a 70 year old male who presents with colorectal cancer, in  ED 03/05/21 with complaints of hemorrhoids and rectal bleeding, anemia. CT showed a large rectal mass. S/P ROBOTIC ASSISTED LOWER ANTERIOR RESECTION WITH END COLOSTOMY  MOBILIZATION OF THE SPLENIC FLEXURE  on 03/05/21, post op tachycardia and hypotension and abscess-drains placed.    PT Comments    Pt reports he has amb 1x today; asking about amb with wife, PT will continue to follow. Pt fatigues with longer distances, unsteady at times especially with fatigue   Follow Up Recommendations  Home health PT     Equipment Recommendations  None recommended by PT (states he has RW)    Recommendations for Other Services       Precautions / Restrictions Precautions Precautions: Fall Precaution Comments: colostomy, scrotal edema, 3 drains Restrictions Weight Bearing Restrictions: No    Mobility  Bed Mobility Overal bed mobility: Modified Independent             General bed mobility comments: incr time, no physical assist    Transfers Overall transfer level: Needs assistance Equipment used: Rolling walker (2 wheeled) Transfers: Sit to/from Stand Sit to Stand: Min guard;Supervision         General transfer comment: cues for hand placement  Ambulation/Gait Ambulation/Gait assistance: Min guard;Supervision Gait Distance (Feet): 380 Feet Assistive device: Rolling walker (2 wheeled) Gait Pattern/deviations: Step-through pattern;Decreased stride length;Steppage (appears steppage but is able to df)     General Gait Details: steady, incr instability with fatigue however no overt LOB, distance to tolerance   Stairs             Wheelchair Mobility    Modified Rankin (Stroke Patients Only)       Balance           Standing balance  support: Bilateral upper extremity supported Standing balance-Leahy Scale: Fair                              Cognition Arousal/Alertness: Awake/alert Behavior During Therapy: WFL for tasks assessed/performed Overall Cognitive Status: Within Functional Limits for tasks assessed                                        Exercises General Exercises - Lower Extremity Ankle Circles/Pumps: AROM;Both;10 reps;Supine    General Comments        Pertinent Vitals/Pain Pain Assessment: Faces Faces Pain Scale: Hurts a little bit Pain Location: abdomen Pain Descriptors / Indicators: Discomfort;Grimacing Pain Intervention(s): Limited activity within patient's tolerance;Monitored during session;Repositioned    Home Living                      Prior Function            PT Goals (current goals can now be found in the care plan section) Acute Rehab PT Goals Patient Stated Goal: to do yardwork, walk more frequently PT Goal Formulation: With patient/family Time For Goal Achievement: 04/09/21 Potential to Achieve Goals: Good Progress towards PT goals: Progressing toward goals    Frequency    Min 3X/week      PT Plan      Co-evaluation  AM-PAC PT "6 Clicks" Mobility   Outcome Measure  Help needed turning from your back to your side while in a flat bed without using bedrails?: A Little Help needed moving from lying on your back to sitting on the side of a flat bed without using bedrails?: A Little Help needed moving to and from a bed to a chair (including a wheelchair)?: A Little Help needed standing up from a chair using your arms (e.g., wheelchair or bedside chair)?: A Little Help needed to walk in hospital room?: A Little Help needed climbing 3-5 steps with a railing? : A Little 6 Click Score: 18    End of Session               Time: 1428-1500 PT Time Calculation (min) (ACUTE ONLY): 32 min  Charges:  $Gait  Training: 23-37 mins                     Baxter Flattery, PT  Acute Rehab Dept ([place]) [phone] Pager [phone]  04/01/2021    [facility] 04/01/2021, 3:15 PM

## 2021-04-01 NOTE — Progress Notes (Signed)
Nutrition Follow-up  DOCUMENTATION CODES:   Non-severe (moderate) malnutrition in context of chronic illness  INTERVENTION:   -TPN management per Pharmacy   -Boost Breeze po BID, each supplement provides 250 kcal and 9 grams of protein  -Prosource Plus PO daily, each provides 100 kcals and 15g protein  -Multivitamin with minerals daily  NUTRITION DIAGNOSIS:   Moderate Malnutrition related to chronic illness,cancer and cancer related treatments as evidenced by mild fat depletion,moderate fat depletion,mild muscle depletion,moderate muscle depletion,severe muscle depletion.   Ongoing.  GOAL:   Patient will meet greater than or equal to 90% of their needs  Progressing.  MONITOR:   PO intake,Supplement acceptance,Diet advancement,Labs,Weight trends,Other (Comment) (TPN regimen)  ASSESSMENT:   70 yo male with a PMH of arthritis and recent dx (08/2020) of rectal cancer (current treatment of [facility]) who presents with rectal bleeding. Pt with AKI and ileus.  Significant Events: 4/27- anterior resection with end colostomy;diet advanced to CLD 4/28- NPO 4/30- advanced toCLDthen back to NPO 5/1- NGT placed 5/2-double lumen PICC placed in R basilic; TPN initiation  5/4- NGT removed 5/9- diet advanced to CLD 5/10- diet advanced to FLD 5/11- diet downgraded to NPO 5/14- diet advanced to CLD 5/17 -FLD 5/19- Soft diet  Patient currently on soft diet. Last meal intake was 10% on 5/21. Pt is accepting Boost Breeze and only 1 of Prosource, yesterday. Will increase supplements.  TPN to continue at 45 ml/hr, providing 1100 kcals and 56g protein.  Admission weight: 183 lbs. Current weight: 171 lbs.  Medications reviewed.  Labs reviewed: CBGs: 103-112 Low Na   Diet Order:   Diet Order            DIET SOFT Room service appropriate? Yes; Fluid consistency: Thin  Diet effective now                 EDUCATION NEEDS:   Education needs have been addressed  Skin:   Skin Assessment: Skin Integrity Issues: Skin Integrity Issues:: Stage II,Other (Comment) Stage II: sacrum (newly documented 5/13) Incisions: Abdomen, closed Other: MASD (newly documented 5/17)  Last BM:  5/10 (150 ml via ostomy)  Height:   Ht Readings from Last 1 Encounters:  02/26/21 5\' 10"  (1.778 m)    Weight:   Wt Readings from Last 1 Encounters:  03/31/21 77.6 kg    Ideal Body Weight:  75.5 kg  BMI:  Body mass index is 24.54 kg/m.  Estimated Nutritional Needs:   Kcal:  2200-2400  Protein:  90-115 grams  Fluid:  >2 L  Clayton Bibles, MS, RD, LDN Inpatient Clinical Dietitian Contact information available via Amion

## 2021-04-01 NOTE — Progress Notes (Signed)
Referring Physician(s): Gross,S  Supervising Physician: Dr. Anselm Pancoast  Patient Status:  [facility] - In-pt  Chief Complaint: Abdominal pain/abscesses   Subjective: Patient feels pretty good. A little better than a few days ago. Sitting up in chair. States drains were flushed earlier   Allergies: Nsaids  Medications:  Current Facility-Administered Medications:  .  (feeding supplement) PROSource Plus liquid 30 mL, 30 mL, Oral, Daily, Leighton Ruff, MD, 30 mL at 46/96/29 1653 .  0.9 %  sodium chloride infusion, , Intravenous, PRN, Leighton Ruff, MD, Last Rate: 10 mL/hr at 03/29/21 1800, Infusion Verify at 03/29/21 1800 .  acetaminophen (TYLENOL) tablet 1,000 mg, 1,000 mg, Oral, B2W, Leighton Ruff, MD, 4,132 mg at 04/01/21 0815 .  albuterol (PROVENTIL) (2.5 MG/3ML) 0.083% nebulizer solution 2.5 mg, 2.5 mg, Nebulization, G4W PRN, Leighton Ruff, MD, 2.5 mg at 03/09/21 1651 .  alum & mag hydroxide-simeth (MAALOX/MYLANTA) 200-200-20 MG/5ML suspension 30 mL, 30 mL, Oral, N0U PRN, Leighton Ruff, MD .  Ampicillin-Sulbactam (UNASYN) 3 g in sodium chloride 0.9 % 100 mL IVPB, 3 g, Intravenous, V2Z, Leighton Ruff, MD, Last Rate: 200 mL/hr at 04/01/21 0828, 3 g at 04/01/21 0828 .  bacitracin ointment, , Topical, BID, Leighton Ruff, MD, Given at 36/64/40 0840 .  chlorhexidine (PERIDEX) 0.12 % solution 15 mL, 15 mL, Mouth Rinse, BID, Leighton Ruff, MD, 15 mL at 34/74/25 0816 .  Chlorhexidine Gluconate Cloth 2 % PADS 6 each, 6 each, Topical, Daily, Leighton Ruff, MD, 6 each at 04/01/21 0840 .  chlorproMAZINE (THORAZINE) tablet 25 mg, 25 mg, Oral, TID PRN, Leighton Ruff, MD, 25 mg at 95/63/87 1253 .  dextrose 5 % and 0.2 % NaCl infusion, , Intravenous, Continuous, Leighton Ruff, MD, Last Rate: 10 mL/hr at 03/24/21 1820, New Bag at 03/24/21 1820 .  feeding supplement (BOOST / RESOURCE BREEZE) liquid 1 Container, 1 Container, Oral, BID BM, Leighton Ruff, MD .  haloperidol lactate (HALDOL)  injection 2-5 mg, 2-5 mg, Intravenous, F6E PRN, Leighton Ruff, MD, 5 mg at 33/29/51 2030 .  HYDROmorphone (DILAUDID) injection 0.5-1 mg, 0.5-1 mg, Intravenous, O8C PRN, Leighton Ruff, MD, 1 mg at 16/60/63 0514 .  lip balm (CARMEX) ointment 1 application, 1 application, Topical, BID, Leighton Ruff, MD, 1 application at 01/60/10 0840 .  magic mouthwash, 15 mL, Oral, QID PRN, Leighton Ruff, MD .  MEDLINE mouth rinse, 15 mL, Mouth Rinse, X32T5T, Leighton Ruff, MD, 15 mL at 73/22/02 1208 .  melatonin tablet 3 mg, 3 mg, Oral, QHS, Leighton Ruff, MD, 3 mg at 54/27/06 2245 .  methocarbamol (ROBAXIN) 1,000 mg in dextrose 5 % 100 mL IVPB, 1,000 mg, Intravenous, C3J PRN, Leighton Ruff, MD, Last Rate: 200 mL/hr at 04/01/21 0139, 1,000 mg at 04/01/21 0139 .  metoprolol tartrate (LOPRESSOR) injection 5 mg, 5 mg, Intravenous, S2G PRN, Leighton Ruff, MD .  multivitamin with minerals tablet 1 tablet, 1 tablet, Oral, Daily, Leighton Ruff, MD, 1 tablet at 04/01/21 0815 .  naloxone [facility]) injection 0.4 mg, 0.4 mg, Intravenous, PRN, Leighton Ruff, MD .  ondansetron [facility]) tablet 4 mg, 4 mg, Oral, Q6H PRN **OR** ondansetron (ZOFRAN) injection 4 mg, 4 mg, Intravenous, B1D PRN, Leighton Ruff, MD, 4 mg at 17/61/60 2023 .  oxyCODONE (Oxy IR/ROXICODONE) immediate release tablet 5-10 mg, 5-10 mg, Oral, V3X PRN, Leighton Ruff, MD, 10 mg at 10/62/69 0815 .  pantoprazole (PROTONIX) EC tablet 40 mg, 40 mg, Oral, BID Crissie Figures, MD, 40 mg at 48/54/62 0815 .  simethicone (MYLICON) chewable tablet 40  mg, 40 mg, Oral, B8G PRN, Leighton Ruff, MD, 40 mg at 66/59/93 1940 .  sodium chloride flush (NS) 0.9 % injection 10-40 mL, 10-40 mL, Intracatheter, T70V, Leighton Ruff, MD, 30 mL at 77/93/90 0840 .  sodium chloride flush (NS) 0.9 % injection 10-40 mL, 10-40 mL, Intracatheter, PRN, Leighton Ruff, MD, 20 mL at 30/09/23 1834 .  sodium chloride flush (NS) 0.9 % injection 5 mL, 5 mL, Intracatheter, R0Q, Leighton Ruff, MD, 5 mL at 76/22/63 0641 .  sodium chloride flush (NS) 0.9 % injection 5 mL, 5 mL, Intracatheter, F3L, Leighton Ruff, MD, 5 mL at 45/62/56 0641 .  TPN ADULT (ION), , Intravenous, Continuous TPN, Leighton Ruff, MD, Last Rate: 45 mL/hr at 03/31/21 1721, New Bag at 03/31/21 1721 .  TPN ADULT (ION), , Intravenous, Continuous TPN, Leighton Ruff, MD    Vital Signs: BP 136/62 (BP Location: Left Arm)   Pulse (!) 104   Temp 98.6 F (37 C)   Resp 18   Wt 77.6 kg   SpO2 100%   BMI 24.54 kg/m   Physical Exam  Sitting up in chair.  Right and left abdominal drains intact, insertion sites okay, both drains irrigated without difficulty. Large surgical drain with essentially bulb full of watery fluid and fibrinous debris.  Imaging: CT ABDOMEN PELVIS WO CONTRAST  Result Date: 03/30/2021 CLINICAL DATA:  History of rectal cancer. Abdominal pain. Status post drainage of abscess. EXAM: CT ABDOMEN AND PELVIS WITHOUT CONTRAST TECHNIQUE: Multidetector CT imaging of the abdomen and pelvis was performed following the standard protocol without IV contrast. COMPARISON:  Mar 19, 2021. FINDINGS: Lower chest: Bilateral pleural effusions are again noted with adjacent subsegmental atelectasis of both lower lobes. Hepatobiliary: No gallstones or biliary dilatation is noted. 5.6 cm subcapsular fluid collection is noted posteriorly along the right hepatic lobe which is slightly decreased compared to prior exam. Pancreas: Unremarkable. No pancreatic ductal dilatation or surrounding inflammatory changes. Spleen: Normal in size without focal abnormality. Adrenals/Urinary Tract: Adrenal glands are unremarkable. Hydronephrosis noted on prior exam appears to have resolved. Probable cyst is noted in lower pole of right kidney. Urinary bladder is decompressed secondary to Foley catheter. Stomach/Bowel: Stable wall and fold thickening is seen involving the visualized distal esophagus and stomach concerning for inflammation.  Stable colostomy seen in left lower quadrant. Mildly dilated proximal small bowel loops are noted concerning for ileus. Vascular/Lymphatic: Aortic atherosclerosis. No enlarged abdominal or pelvic lymph nodes. Reproductive: Prostate is unremarkable. Bilateral hydroceles are again noted. Other: 8.7 x 5.9 cm fluid collection with some gas is noted in the posterior pelvis with surgical drain present. This is concerning for abscess. Continued presence of percutaneous drainage catheter in fluid collection in left pericolic gutter. This collection appears to be smaller compared to prior exam. There is been interval placement of new drainage catheter into the right lower quadrant. No significant residual fluid collection is noted. Mild anasarca is noted. Musculoskeletal: No acute or significant osseous findings. IMPRESSION: Bilateral pleural effusions are again noted with adjacent subsegmental atelectasis. 5.6 cm subcapsular fluid collection is noted posteriorly along the right hepatic lobe which is slightly decreased compared to prior exam. 8.7 x 5.9 cm gas and fluid collection is noted in the posterior cul-de-sac with surgical drain present. This is slightly enlarged compared to prior exam and is concerning for abscess. Continued presence of percutaneous drainage catheter seen in fluid collection in left pericolic gutter superiorly. This collection appears to be smaller compared to prior exam. Interval placement of new  percutaneous drainage catheter into the right lower quadrant, with no significant residual fluid collection noted. Bilateral hydronephrosis noted on prior exam appears to have resolved status post decompression of urinary bladder secondary to Foley catheter. Bilateral hydroceles are again noted. Mildly dilated proximal small bowel loops are noted most consistent with ileus. Continued presence of distal esophageal and gastric wall thickening is noted concerning for gastritis and esophagitis. Electronically  Signed   By: Marijo Conception M.D.   On: 03/30/2021 16:38    Labs:  CBC: Recent Labs    03/28/21 0812 03/29/21 0255 03/30/21 0455 03/31/21 0501  WBC 19.5* 19.3* 22.3* 21.9*  HGB 7.9* 7.8* 7.9* 7.6*  HCT 25.2* 25.0* 24.8* 23.7*  PLT 618* 551* 539* 497*    COAGS: Recent Labs    03/20/21 0313  INR 1.3*    BMP: Recent Labs    07/04/20 0845 07/15/20 0048 08/13/20 0906 03/27/21 0354 03/28/21 0321 03/30/21 0455 03/31/21 0501  NA 138 136   < > 137 136 135 134*  K 3.8 3.7   < > 3.7 3.7 3.7 4.0  CL 108 106   < > 107 106 104 102  CO2 25 21*   < > 27 26 26 26   GLUCOSE 105* 100*   < > 99 111* 108* 113*  BUN 12 21   < > 16 16 13 16   CALCIUM 9.9 9.2   < > 8.7* 8.4* 8.6* 8.6*  CREATININE 0.96 0.94   < > 0.59* 0.68 0.68 0.72  GFRNONAA >60 >60   < > >60 >60 >60 >60  GFRAA >60 >60  --   --   --   --   --    < > = values in this interval not displayed.    LIVER FUNCTION TESTS: Recent Labs    03/20/21 0313 03/24/21 0340 03/27/21 0354 03/31/21 0501  BILITOT 0.2* 0.1* 0.5 0.2*  AST 38 29 26 14*  ALT 33 30 29 18   ALKPHOS 104 130* 138* 99  PROT 6.2* 6.9 6.8 6.8  ALBUMIN 1.8* 2.0* 2.0* 2.0*    Assessment and Plan: Patient with history of rectal cancer, status post LAR with end colostomy;subsequently developed postop left upper quadrant abdabscess, s/pdrain placementon 5/3; statuspost drainage of right lower abdominal quadrant/pelvic abscess on 5/12;   Continue current treatment, including irrigation of surgical drain tid; as per surgery if this fails to improve output may need surgical drain replacement or manipulation; consider drain injections before removal   Electronically Signed: Ascencion Dike, PA-C 04/01/2021, 12:44 PM   I spent a total of 15 minutes at the the patient's bedside AND on the patient's hospital floor or unit, greater than 50% of which was counseling/coordinating care for right and left abdominal abscess drains

## 2021-04-02 LAB — CBC
HCT: 21.5 % — ABNORMAL LOW (ref 39.0–52.0)
HCT: 21.2 % — ABNORMAL LOW (ref 39.0–52.0)
Hemoglobin: 6.8 g/dL — CL (ref 13.0–17.0)
Hemoglobin: 6.8 g/dL — CL (ref 13.0–17.0)
MCH: 27 pg (ref 26.0–34.0)
MCH: 27.2 pg (ref 26.0–34.0)
MCHC: 31.6 g/dL (ref 30.0–36.0)
MCHC: 32.1 g/dL (ref 30.0–36.0)
MCV: 85.3 fL (ref 80.0–100.0)
MCV: 84.8 fL (ref 80.0–100.0)
Platelets: 421 10*3/uL — ABNORMAL HIGH (ref 150–400)
Platelets: 432 10*3/uL — ABNORMAL HIGH (ref 150–400)
RBC: 2.52 MIL/uL — ABNORMAL LOW (ref 4.22–5.81)
RBC: 2.5 MIL/uL — ABNORMAL LOW (ref 4.22–5.81)
RDW: 17.8 % — ABNORMAL HIGH (ref 11.5–15.5)
RDW: 17.9 % — ABNORMAL HIGH (ref 11.5–15.5)
WBC: 23.1 10*3/uL — ABNORMAL HIGH (ref 4.0–10.5)
WBC: 22 10*3/uL — ABNORMAL HIGH (ref 4.0–10.5)
nRBC: 0 % (ref 0.0–0.2)
nRBC: 0 % (ref 0.0–0.2)

## 2021-04-02 LAB — PREPARE RBC (CROSSMATCH)

## 2021-04-02 MED ORDER — TRAVASOL 10 % IV SOLN
INTRAVENOUS | Status: AC
  Filled 2021-04-02: qty 561.6

## 2021-04-02 MED ORDER — SODIUM CHLORIDE 0.9% IV SOLUTION: Freq: Once | INTRAVENOUS | Status: DC

## 2021-04-02 MED ORDER — ALTEPLASE 2 MG IJ SOLR
2.0000 mg | Freq: Once | INTRAMUSCULAR | Status: AC
  Administered 2021-04-02: 2 mg
  Filled 2021-04-02: qty 2

## 2021-04-02 MED ORDER — SODIUM CHLORIDE 0.9 % IV SOLN
25.0000 mg | Freq: Four times a day (QID) | INTRAVENOUS | Status: DC | PRN
  Filled 2021-04-02: qty 1

## 2021-04-02 MED ORDER — ACETAMINOPHEN 325 MG PO TABS
650.0000 mg | ORAL_TABLET | Freq: Once | ORAL | Status: AC
  Administered 2021-04-02: 650 mg via ORAL

## 2021-04-02 NOTE — Progress Notes (Signed)
Referring Physician(s): Gross,S  Supervising Physician: Daryll Brod  Patient Status:  [facility] - In-pt  Chief Complaint: Abdominal abscesses   Subjective:  Pt currently denies fever,HA,CP,dyspnea, worsening abd pain,N/V or visible bleeding; he did have episode of blood-tinged vomiting last night after taking a pill; hgb remains at 6.8  Allergies: Nsaids  Medications: Prior to Admission medications   Medication Sig Start Date End Date Taking? Authorizing Provider  Diclofenac Sodium 2 % SOLN Place 2 g onto the skin 2 (two) times daily. Patient taking differently: Place 2 g onto the skin daily as needed (pain). 06/30/19  Yes Lyndal Pulley, DO  docusate sodium (COLACE) 100 MG capsule Take 100 mg by mouth daily as needed for mild constipation.    Yes [provider]  HYDROcodone-acetaminophen (NORCO/VICODIN) 5-325 MG tablet Take 1 tablet by mouth every 6 (six) hours as needed for moderate pain. 02/12/21  Yes Owens Shark, NP  hydrocortisone (ANUSOL-HC) 2.5 % rectal cream Place 1 application rectally 2 (two) times daily. Patient taking differently: Place 1 application rectally daily as needed for hemorrhoids or anal itching. 01/28/21  Yes Marrian Salvage, FNP  lidocaine-prilocaine (EMLA) cream Apply 1 application topically as needed. Patient taking differently: Apply 1 application topically as needed (prior to port use). 10/21/20  Yes Ladell Pier, MD  pravastatin (PRAVACHOL) 20 MG tablet TAKE 1 TABLET(20 MG) BY MOUTH DAILY Patient taking differently: Take 20 mg by mouth daily. 11/12/20  Yes Marrian Salvage, FNP  capecitabine (XELODA) 500 MG tablet TAKE 2000 MG (4 TABLETS) EVERY MORNING AND 1500 MG (3 TABLETS) EVERY EVENING FOR A TOTAL DAILY DOSE OF 3500 MG ON RADIATION DAYS ONLY. Patient not taking: No sig reported 11/04/20 11/04/21  Ladell Pier, MD  diphenoxylate-atropine (LOMOTIL) 2.5-0.025 MG tablet Take 1-2 tablets by mouth 3 (three) times daily as  needed for diarrhea or loose stools (Maxiumum of 6/day). Patient not taking: No sig reported 12/25/20   Ladell Pier, MD  ondansetron (ZOFRAN) 8 MG tablet Take 1 tablet (8 mg total) by mouth every 8 (eight) hours as needed for nausea or vomiting. Start 72 hours after each IV chemotherapy treatment 07/30/20   Ladell Pier, MD  prochlorperazine (COMPAZINE) 10 MG tablet Take 1 tablet (10 mg total) by mouth every 6 (six) hours as needed for nausea. 07/30/20   Ladell Pier, MD     Vital Signs: BP (!) 131/57 (BP Location: Left Arm)   Pulse 97   Temp 98.6 F (37 C) (Oral)   Resp 18   Wt 174 lb 13.2 oz (79.3 kg)   SpO2 100%   BMI 25.08 kg/m   Physical Exam awake/alert; R/L abd  IR drains intact, insertion sites ok; both drains flushed without difficulty with minimal return from right and some air bubbles in left, OP L- 40 cc turbid yellow fluid, R- 20 cc yellow fluid; surgical drain OP 140 cc  Imaging: CT ABDOMEN PELVIS WO CONTRAST  Result Date: 03/30/2021 CLINICAL DATA:  History of rectal cancer. Abdominal pain. Status post drainage of abscess. EXAM: CT ABDOMEN AND PELVIS WITHOUT CONTRAST TECHNIQUE: Multidetector CT imaging of the abdomen and pelvis was performed following the standard protocol without IV contrast. COMPARISON:  Mar 19, 2021. FINDINGS: Lower chest: Bilateral pleural effusions are again noted with adjacent subsegmental atelectasis of both lower lobes. Hepatobiliary: No gallstones or biliary dilatation is noted. 5.6 cm subcapsular fluid collection is noted posteriorly along the right hepatic lobe which is slightly decreased  compared to prior exam. Pancreas: Unremarkable. No pancreatic ductal dilatation or surrounding inflammatory changes. Spleen: Normal in size without focal abnormality. Adrenals/Urinary Tract: Adrenal glands are unremarkable. Hydronephrosis noted on prior exam appears to have resolved. Probable cyst is noted in lower pole of right kidney. Urinary bladder is  decompressed secondary to Foley catheter. Stomach/Bowel: Stable wall and fold thickening is seen involving the visualized distal esophagus and stomach concerning for inflammation. Stable colostomy seen in left lower quadrant. Mildly dilated proximal small bowel loops are noted concerning for ileus. Vascular/Lymphatic: Aortic atherosclerosis. No enlarged abdominal or pelvic lymph nodes. Reproductive: Prostate is unremarkable. Bilateral hydroceles are again noted. Other: 8.7 x 5.9 cm fluid collection with some gas is noted in the posterior pelvis with surgical drain present. This is concerning for abscess. Continued presence of percutaneous drainage catheter in fluid collection in left pericolic gutter. This collection appears to be smaller compared to prior exam. There is been interval placement of new drainage catheter into the right lower quadrant. No significant residual fluid collection is noted. Mild anasarca is noted. Musculoskeletal: No acute or significant osseous findings. IMPRESSION: Bilateral pleural effusions are again noted with adjacent subsegmental atelectasis. 5.6 cm subcapsular fluid collection is noted posteriorly along the right hepatic lobe which is slightly decreased compared to prior exam. 8.7 x 5.9 cm gas and fluid collection is noted in the posterior cul-de-sac with surgical drain present. This is slightly enlarged compared to prior exam and is concerning for abscess. Continued presence of percutaneous drainage catheter seen in fluid collection in left pericolic gutter superiorly. This collection appears to be smaller compared to prior exam. Interval placement of new percutaneous drainage catheter into the right lower quadrant, with no significant residual fluid collection noted. Bilateral hydronephrosis noted on prior exam appears to have resolved status post decompression of urinary bladder secondary to Foley catheter. Bilateral hydroceles are again noted. Mildly dilated proximal small bowel  loops are noted most consistent with ileus. Continued presence of distal esophageal and gastric wall thickening is noted concerning for gastritis and esophagitis. Electronically Signed   By: Marijo Conception M.D.   On: 03/30/2021 16:38    Labs:  CBC: Recent Labs    03/30/21 0455 03/31/21 0501 04/02/21 0425 04/02/21 0745  WBC 22.3* 21.9* 23.1* 22.0*  HGB 7.9* 7.6* 6.8* 6.8*  HCT 24.8* 23.7* 21.5* 21.2*  PLT 539* 497* 421* 432*    COAGS: Recent Labs    03/20/21 0313  INR 1.3*    BMP: Recent Labs    07/04/20 0845 07/15/20 0048 08/13/20 0906 03/27/21 0354 03/28/21 0321 03/30/21 0455 03/31/21 0501  NA 138 136   < > 137 136 135 134*  K 3.8 3.7   < > 3.7 3.7 3.7 4.0  CL 108 106   < > 107 106 104 102  CO2 25 21*   < > 27 26 26 26   GLUCOSE 105* 100*   < > 99 111* 108* 113*  BUN 12 21   < > 16 16 13 16   CALCIUM 9.9 9.2   < > 8.7* 8.4* 8.6* 8.6*  CREATININE 0.96 0.94   < > 0.59* 0.68 0.68 0.72  GFRNONAA >60 >60   < > >60 >60 >60 >60  GFRAA >60 >60  --   --   --   --   --    < > = values in this interval not displayed.    LIVER FUNCTION TESTS: Recent Labs    03/20/21 0313 03/24/21 0340 03/27/21  0354 03/31/21 0501  BILITOT 0.2* 0.1* 0.5 0.2*  AST 38 29 26 14*  ALT 33 30 29 18   ALKPHOS 104 130* 138* 99  PROT 6.2* 6.9 6.8 6.8  ALBUMIN 1.8* 2.0* 2.0* 2.0*    Assessment and Plan: Patient with history of rectal cancer, status post LAR with end colostomy;subsequently developedpostop left upper quadrant abdabscess, s/pdrain placementon 5/3; statuspost drainage of right lower abdominal quadrant/pelvic abscess on 5/12;mostrecent drain cultures growing rare Enterococcus ; afebrile; BP ok; WBC 22(23.1), hgb 6.8(7.6)-? transfuse; surgical drain OP up some from yesterday; cont drain irrigations, close lab monitoring; will need drain injections before removal; additional plans as per [facility]   Electronically Signed: D. Rowe Robert, PA-C 04/02/2021, 9:57 AM   I spent a  total of 15 minutes at the the patient's bedside AND on the patient's hospital floor or unit, greater than 50% of which was counseling/coordinating care for right /left abdominal abscess drains    [name], male   DOB: Feb 16, 1951, 70 y.o.   MRN: [id number]

## 2021-04-02 NOTE — Progress Notes (Signed)
PHARMACY - TOTAL PARENTERAL NUTRITION CONSULT NOTE   Indication: Prolonged ileus  Patient Measurements: Weight: 79.3 kg (174 lb 13.2 oz)   Body mass index is 25.08 kg/m.   Assessment: 70 yo male with rectal cancer s/p xi robotic assisted lower anterior resection with end colostomy on 4/27. Developed post-op abscess and ileus. Pharmacy consulted to initiate TPN on 5/2.  Glucose / Insulin: Glucose continues to be at goal < 180 from bmet, not currently on SSI or CBG checks Electrolytes: All lytes WNL except Na slightly low at 134; CorrCa (10.2) Renal: SCr and BUN stable, WNL Hepatic: AST/ALT and Tbili WNLTG 100 (5/23) Albumin/prealbumin: alb 2, prealb 8.3 (5/23) down Intake / Output; MIVF:  - I/O last 24hr net: -308 ml - Drain output 200 mL  - Colostomy output: 25 ml - UOP 1050 mL - D5 1/4NS at [facility] per Md GI Imaging: - 5/11: New right abdominopelvic loculated ascites, suspicious for developing abscess or infected ascites -5/22: Repeat Abdominal CT ordered GI Surgeries / Procedures:  - 4/27:  xi robotic assisted lower anterior resection with end colostomy on 4/27 - 5/6: NGT out and unable to be replaced thus far -5/12: CT guided placed of a 12 Fr drainage catheter placement into the right lower abdomen/pelvis yielding 75 cc of purulent appearing fluid  Central access: 5/2 TPN start date: 5/2  Nutritional Goals (per RD recommendation on 5/17):   2200-2400 kcal/day, protein 90-115 g/day, and >2L/day  Goal TPN rate is 90 mL/hr (provides 112 g of protein and 2199 kcals per day)  Current Nutrition:  - TPN at 1/2 of goal rate - Soft diet, little intake per Dr. Marcello Moores; meal intake not charted - Boost Breeze daily (250kcal, 9g protein); Prosource Plus daily (100 kcal, 15g protein)  Plan:  At 18:00  Continue with half rate TPN at 21mL/hr  Provides 162 g dextrose, 56 g protein, 1100 kcal  Electrolytes in TPN:   Na 60 mEq/L  K 60 mEq/L   Ca 53mEq/L  Mg 7.67mEq/L  Phos 20  mmol/L  Ratio 1 Cl: 2 Ac   Standard MVI and trace elements ordered PO  D5 1/4 NS at 10 ml/hr per MD  TPN labs on Mon/Thurs.   Ulice Dash D 04/02/21 10:47 AM

## 2021-04-02 NOTE — Progress Notes (Signed)
Referring Physician(s): Gross,S  Supervising Physician: Sandi Mariscal  Patient Status:  [facility] - In-pt  Chief Complaint:  Abdominal pain/abscesses  Subjective: Pt seen earlier today; has been evaluated by [facility] since that time and now order received for placement of additional pelvic abscess drain (likely TG approach) in region of previously placed rt lower abd surgical drain with incomplete evacuation of abscess by CT despite vigorous flushing/TPA infusion.    Allergies: Nsaids  Medications: Prior to Admission medications   Medication Sig Start Date End Date Taking? Authorizing Provider  Diclofenac Sodium 2 % SOLN Place 2 g onto the skin 2 (two) times daily. Patient taking differently: Place 2 g onto the skin daily as needed (pain). 06/30/19  Yes Lyndal Pulley, DO  docusate sodium (COLACE) 100 MG capsule Take 100 mg by mouth daily as needed for mild constipation.    Yes [provider]  HYDROcodone-acetaminophen (NORCO/VICODIN) 5-325 MG tablet Take 1 tablet by mouth every 6 (six) hours as needed for moderate pain. 02/12/21  Yes Owens Shark, NP  hydrocortisone (ANUSOL-HC) 2.5 % rectal cream Place 1 application rectally 2 (two) times daily. Patient taking differently: Place 1 application rectally daily as needed for hemorrhoids or anal itching. 01/28/21  Yes Marrian Salvage, FNP  lidocaine-prilocaine (EMLA) cream Apply 1 application topically as needed. Patient taking differently: Apply 1 application topically as needed (prior to port use). 10/21/20  Yes Ladell Pier, MD  pravastatin (PRAVACHOL) 20 MG tablet TAKE 1 TABLET(20 MG) BY MOUTH DAILY Patient taking differently: Take 20 mg by mouth daily. 11/12/20  Yes Marrian Salvage, FNP  capecitabine (XELODA) 500 MG tablet TAKE 2000 MG (4 TABLETS) EVERY MORNING AND 1500 MG (3 TABLETS) EVERY EVENING FOR A TOTAL DAILY DOSE OF 3500 MG ON RADIATION DAYS ONLY. Patient not taking: No sig reported 11/04/20 11/04/21   Ladell Pier, MD  diphenoxylate-atropine (LOMOTIL) 2.5-0.025 MG tablet Take 1-2 tablets by mouth 3 (three) times daily as needed for diarrhea or loose stools (Maxiumum of 6/day). Patient not taking: No sig reported 12/25/20   Ladell Pier, MD  ondansetron (ZOFRAN) 8 MG tablet Take 1 tablet (8 mg total) by mouth every 8 (eight) hours as needed for nausea or vomiting. Start 72 hours after each IV chemotherapy treatment 07/30/20   Ladell Pier, MD  prochlorperazine (COMPAZINE) 10 MG tablet Take 1 tablet (10 mg total) by mouth every 6 (six) hours as needed for nausea. 07/30/20   Ladell Pier, MD     Vital Signs: BP (!) 149/54 (BP Location: Left Arm)   Pulse (!) 106   Temp 99.6 F (37.6 C) (Oral)   Resp 18   Wt 174 lb 13.2 oz (79.3 kg)   SpO2 100%   BMI 25.08 kg/m   Physical Exam awake/alert; chest- dim BS bases; heart- sl tachy but regular rhythm; abd- soft, intact rt/left abd drains draining hazy yellow fluid, ostomy intact; no LE edema  Imaging: CT ABDOMEN PELVIS WO CONTRAST  Result Date: 03/30/2021 CLINICAL DATA:  History of rectal cancer. Abdominal pain. Status post drainage of abscess. EXAM: CT ABDOMEN AND PELVIS WITHOUT CONTRAST TECHNIQUE: Multidetector CT imaging of the abdomen and pelvis was performed following the standard protocol without IV contrast. COMPARISON:  Mar 19, 2021. FINDINGS: Lower chest: Bilateral pleural effusions are again noted with adjacent subsegmental atelectasis of both lower lobes. Hepatobiliary: No gallstones or biliary dilatation is noted. 5.6 cm subcapsular fluid collection is noted posteriorly along the right hepatic  lobe which is slightly decreased compared to prior exam. Pancreas: Unremarkable. No pancreatic ductal dilatation or surrounding inflammatory changes. Spleen: Normal in size without focal abnormality. Adrenals/Urinary Tract: Adrenal glands are unremarkable. Hydronephrosis noted on prior exam appears to have resolved. Probable cyst is  noted in lower pole of right kidney. Urinary bladder is decompressed secondary to Foley catheter. Stomach/Bowel: Stable wall and fold thickening is seen involving the visualized distal esophagus and stomach concerning for inflammation. Stable colostomy seen in left lower quadrant. Mildly dilated proximal small bowel loops are noted concerning for ileus. Vascular/Lymphatic: Aortic atherosclerosis. No enlarged abdominal or pelvic lymph nodes. Reproductive: Prostate is unremarkable. Bilateral hydroceles are again noted. Other: 8.7 x 5.9 cm fluid collection with some gas is noted in the posterior pelvis with surgical drain present. This is concerning for abscess. Continued presence of percutaneous drainage catheter in fluid collection in left pericolic gutter. This collection appears to be smaller compared to prior exam. There is been interval placement of new drainage catheter into the right lower quadrant. No significant residual fluid collection is noted. Mild anasarca is noted. Musculoskeletal: No acute or significant osseous findings. IMPRESSION: Bilateral pleural effusions are again noted with adjacent subsegmental atelectasis. 5.6 cm subcapsular fluid collection is noted posteriorly along the right hepatic lobe which is slightly decreased compared to prior exam. 8.7 x 5.9 cm gas and fluid collection is noted in the posterior cul-de-sac with surgical drain present. This is slightly enlarged compared to prior exam and is concerning for abscess. Continued presence of percutaneous drainage catheter seen in fluid collection in left pericolic gutter superiorly. This collection appears to be smaller compared to prior exam. Interval placement of new percutaneous drainage catheter into the right lower quadrant, with no significant residual fluid collection noted. Bilateral hydronephrosis noted on prior exam appears to have resolved status post decompression of urinary bladder secondary to Foley catheter. Bilateral  hydroceles are again noted. Mildly dilated proximal small bowel loops are noted most consistent with ileus. Continued presence of distal esophageal and gastric wall thickening is noted concerning for gastritis and esophagitis. Electronically Signed   By: Marijo Conception M.D.   On: 03/30/2021 16:38    Labs:  CBC: Recent Labs    03/30/21 0455 03/31/21 0501 04/02/21 0425 04/02/21 0745  WBC 22.3* 21.9* 23.1* 22.0*  HGB 7.9* 7.6* 6.8* 6.8*  HCT 24.8* 23.7* 21.5* 21.2*  PLT 539* 497* 421* 432*    COAGS: Recent Labs    03/20/21 0313  INR 1.3*    BMP: Recent Labs    07/04/20 0845 07/15/20 0048 08/13/20 0906 03/27/21 0354 03/28/21 0321 03/30/21 0455 03/31/21 0501  NA 138 136   < > 137 136 135 134*  K 3.8 3.7   < > 3.7 3.7 3.7 4.0  CL 108 106   < > 107 106 104 102  CO2 25 21*   < > 27 26 26 26   GLUCOSE 105* 100*   < > 99 111* 108* 113*  BUN 12 21   < > 16 16 13 16   CALCIUM 9.9 9.2   < > 8.7* 8.4* 8.6* 8.6*  CREATININE 0.96 0.94   < > 0.59* 0.68 0.68 0.72  GFRNONAA >60 >60   < > >60 >60 >60 >60  GFRAA >60 >60  --   --   --   --   --    < > = values in this interval not displayed.    LIVER FUNCTION TESTS: Recent Labs  03/20/21 0313 03/24/21 0340 03/27/21 0354 03/31/21 0501  BILITOT 0.2* 0.1* 0.5 0.2*  AST 38 29 26 14*  ALT 33 30 29 18   ALKPHOS 104 130* 138* 99  PROT 6.2* 6.9 6.8 6.8  ALBUMIN 1.8* 2.0* 2.0* 2.0*    Assessment and Plan: Patient with history of rectal cancer, status post LAR with end colostomy;subsequently developedpostop left upper quadrant abdabscess, s/pdrain placementon 5/3; statuspost drainage of right lower abdominal quadrant/pelvic abscess on 5/12;mostrecent drain cultures growing rare Enterococcus ;afebrile; BP ok; WBC 22(23.1), hgb 6.8(7.6); latest CT on 03/30/21 revealed:  Bilateral pleural effusions are again noted with adjacent subsegmental atelectasis.  5.6 cm subcapsular fluid collection is noted posteriorly along  the right hepatic lobe which is slightly decreased compared to prior exam.  8.7 x 5.9 cm gas and fluid collection is noted in the posterior cul-de-sac with surgical drain present. This is slightly enlarged compared to prior exam and is concerning for abscess.  Continued presence of percutaneous drainage catheter seen in fluid collection in left pericolic gutter superiorly. This collection appears to be smaller compared to prior exam.  Interval placement of new percutaneous drainage catheter into the right lower quadrant, with no significant residual fluid collection noted.  Bilateral hydronephrosis noted on prior exam appears to have resolved status post decompression of urinary bladder secondary to Foley catheter.  Bilateral hydroceles are again noted.  Mildly dilated proximal small bowel loops are noted most consistent with ileus.  Continued presence of distal esophageal and gastric wall thickening is noted concerning for gastritis and esophagitis.  With persistently elevated WBC and incomplete drainage of abscess in post pelvic cul de sac at site of previously placed surgical drain (despite vigorous flushing/TPA use) request received from [facility] for TG approach placement of new drain into pelvic abscess; case reviewed by Dr. Annamaria Boots; Risks and benefits discussed with the patient including bleeding, infection, damage to adjacent structures, bowel perforation/fistula connection, and sepsis.  All of the patient's questions were answered, patient is agreeable to proceed. Consent signed and in chart.  Procedure scheduled for 5/26  Electronically Signed: D. Rowe Robert, PA-C 04/02/2021, 2:16 PM   I spent a total of 15 minutes at the the patient's bedside AND on the patient's hospital floor or unit, greater than 50% of which was counseling/coordinating care for right/left abdominal abscess drains    [name], male   DOB: Apr 21, 1951, 70 y.o.   MRN:  [id number]

## 2021-04-02 NOTE — Progress Notes (Signed)
Rectal carcinoma (HCC)  Subjective: Still having hiccups.  Feels like soft foods get "stuck" and he needs to vomit.  hgb down this am after vomiting yesterday.      Objective: Vital signs in last 24 hours: Temp:  [97.9 F (36.6 C)-100.1 F (37.8 C)] 98.6 F (37 C) (05/25 0548) Pulse Rate:  [97-110] 97 (05/25 0548) Resp:  [17-18] 18 (05/25 0548) BP: (131-159)/(57-67) 131/57 (05/25 0548) SpO2:  [99 %-100 %] 100 % (05/25 0548) Weight:  [79.3 kg] 79.3 kg (05/25 0500) Last BM Date: 03/31/21  Intake/Output from previous day: 05/24 0701 - 05/25 0700 In: 1167 [P.O.:240; I.V.:577.2; IV Piggyback:319.9] Out: 1475 [Urine:1050; Emesis/NG output:200; Drains:200; Stool:25] Intake/Output this shift: Total I/O In: 110 [I.V.:20; Other:90] Out: 350 [Urine:300; Drains:50]  General appearance: alert and cooperative GI: normal findings: soft, no TTP, no peritonitis, less distention Incision/Wound: clean, dry, intact JP: purulent Ostomy: stool in bag LLQ drain: min purulent output RLQ drain: min cloudy SS fluid  Lab Results:  Results for orders placed or performed during the hospital encounter of 03/05/21 (from the past 24 hour(s))  CBC     Status: Abnormal   Collection Time: 04/02/21  4:25 AM  Result Value Ref Range   WBC 23.1 (H) 4.0 - 10.5 K/uL   RBC 2.52 (L) 4.22 - 5.81 MIL/uL   Hemoglobin 6.8 (LL) 13.0 - 17.0 g/dL   HCT 21.5 (L) 39.0 - 52.0 %   MCV 85.3 80.0 - 100.0 fL   MCH 27.0 26.0 - 34.0 pg   MCHC 31.6 30.0 - 36.0 g/dL   RDW 17.8 (H) 11.5 - 15.5 %   Platelets 421 (H) 150 - 400 K/uL   nRBC 0.0 0.0 - 0.2 %  CBC     Status: Abnormal   Collection Time: 04/02/21  7:45 AM  Result Value Ref Range   WBC 22.0 (H) 4.0 - 10.5 K/uL   RBC 2.50 (L) 4.22 - 5.81 MIL/uL   Hemoglobin 6.8 (LL) 13.0 - 17.0 g/dL   HCT 21.2 (L) 39.0 - 52.0 %   MCV 84.8 80.0 - 100.0 fL   MCH 27.2 26.0 - 34.0 pg   MCHC 32.1 30.0 - 36.0 g/dL   RDW 17.9 (H) 11.5 - 15.5 %   Platelets 432 (H) 150 - 400 K/uL    nRBC 0.0 0.0 - 0.2 %  Type and screen [place]     Status: None (Preliminary result)   Collection Time: 04/02/21 11:14 AM  Result Value Ref Range   ABO/RH(D) PENDING    Antibody Screen PENDING    Sample Expiration      04/05/2021,2359 Performed at [facility], [place] [address] [place], [place] [address]   Prepare RBC (crossmatch)     Status: None   Collection Time: 04/02/21 11:18 AM  Result Value Ref Range   Order Confirmation      ORDER PROCESSED BY BLOOD BANK Performed at [place] [address] [place], [place] [address]      Studies/Results Radiology     MEDS, Scheduled . (feeding supplement) PROSource Plus  30 mL Oral Daily  . sodium chloride   Intravenous Once  . acetaminophen  1,000 mg Oral Q6H  . alteplase  2 mg Intracatheter Once  . bacitracin   Topical BID  . chlorhexidine  15 mL Mouth Rinse BID  . Chlorhexidine Gluconate Cloth  6 each Topical Daily  . feeding supplement  1 Container Oral BID BM  .  lip balm  1 application Topical BID  . mouth rinse  15 mL Mouth Rinse q12n4p  . melatonin  3 mg Oral QHS  . multivitamin with minerals  1 tablet Oral Daily  . pantoprazole  40 mg Oral BID AC  . sodium chloride flush  10-40 mL Intracatheter Q12H  . sodium chloride flush  5 mL Intracatheter Q8H  . sodium chloride flush  5 mL Intracatheter Q8H     Assessment: Rectal carcinoma (HCC)  POD 20 LAR and diverting ostomy Post op abscess: IR drain in LLQ and RLQ, completed course of Zosyn and Eraxis.  Zosyn restarted for uncontrolled abd infection on 5/11.  Wbc remains elevated.  Switched to Unasyn  Hemorrhagic esophagitis, seems to be resolving  Plan: Cont 1/2 TPN, pt not eating much of his soft diet, nutritional shakes between meals Acute blood loss anemia: Hgb has been stable, has had IV Fe and several transfusions ID:   Unasyn per culture sensitivities. Cont IR drains. CT shows pelvic fluid collection  that sits around pelvic drain.  Pelvic drain flushed TID.  No change in wbc.  JP flushed with tpa today by me.  Will repeat CBC in AM.  If pt does not drain this collection with these measures, will need replacement in IR in AM RT for pulm toilet, no signs for pulmonary infection PT for ambulation IV and PO pain meds, attempt to wean IV dilaudid  cont PO PPI for esophagitis, will switch back to fulls for now     LOS: 28 days    Rosario Adie, MD  [facility], [place]    04/02/2021 12:21 PM

## 2021-04-02 NOTE — Progress Notes (Signed)
Date and time results received: 04/02/21 0425   Test: HGB Critical Value:  6.8  Name of Provider Notified: Evlyn Courier  Orders Received? Or Actions Taken?: MD aware, states will wait for attending to round in AM and see what they would like to do

## 2021-04-02 NOTE — Progress Notes (Signed)
PT Cancellation Note  Patient Details Name: Shane [name] MRN: [id number] DOB: [birth year]   Cancelled Treatment:      Pt's hgb is 6.8 and he is getting ready to get PRBC.  He has ambulated with nursing earlier today and declined mobility at this time as just returned to bed. Will f/u as able.  Abran Richard, PT Acute Rehab Services Pager [phone] [facility] [place] 04/02/2021, 5:27 PM

## 2021-04-03 ENCOUNTER — Inpatient Hospital Stay (HOSPITAL_COMMUNITY): Payer: Medicare Other

## 2021-04-03 LAB — CBC
HCT: 28.3 % — ABNORMAL LOW (ref 39.0–52.0)
Hemoglobin: 9.3 g/dL — ABNORMAL LOW (ref 13.0–17.0)
MCH: 27.8 pg (ref 26.0–34.0)
MCHC: 32.9 g/dL (ref 30.0–36.0)
MCV: 84.7 fL (ref 80.0–100.0)
Platelets: 370 10*3/uL (ref 150–400)
RBC: 3.34 MIL/uL — ABNORMAL LOW (ref 4.22–5.81)
RDW: 16.7 % — ABNORMAL HIGH (ref 11.5–15.5)
WBC: 21.8 10*3/uL — ABNORMAL HIGH (ref 4.0–10.5)
nRBC: 0 % (ref 0.0–0.2)

## 2021-04-03 LAB — TYPE AND SCREEN
ABO/RH(D): O POS
Antibody Screen: NEGATIVE
Unit division: 0
Unit division: 0

## 2021-04-03 LAB — COMPREHENSIVE METABOLIC PANEL
ALT: 22 U/L (ref 0–44)
AST: 19 U/L (ref 15–41)
Albumin: 1.9 g/dL — ABNORMAL LOW (ref 3.5–5.0)
Alkaline Phosphatase: 91 U/L (ref 38–126)
Anion gap: 5 (ref 5–15)
BUN: 16 mg/dL (ref 8–23)
CO2: 27 mmol/L (ref 22–32)
Calcium: 8.8 mg/dL — ABNORMAL LOW (ref 8.9–10.3)
Chloride: 101 mmol/L (ref 98–111)
Creatinine, Ser: 0.58 mg/dL — ABNORMAL LOW (ref 0.61–1.24)
GFR, Estimated: >60 mL/min (ref >60)
Glucose, Bld: 113 mg/dL — ABNORMAL HIGH (ref 70–99)
Potassium: 4.1 mmol/L (ref 3.5–5.1)
Sodium: 133 mmol/L — ABNORMAL LOW (ref 135–145)
Total Bilirubin: 0.4 mg/dL (ref 0.3–1.2)
Total Protein: 6.8 g/dL (ref 6.5–8.1)

## 2021-04-03 LAB — BPAM RBC
Blood Product Expiration Date: 202206242359
Blood Product Expiration Date: 202206242359
ISSUE DATE / TIME: 202205251926
ISSUE DATE / TIME: 202205252255
Unit Type and Rh: 5100
Unit Type and Rh: 5100

## 2021-04-03 LAB — PROTIME-INR
INR: 1.2 (ref 0.8–1.2)
Prothrombin Time: 15 seconds (ref 11.4–15.2)

## 2021-04-03 LAB — MAGNESIUM: Magnesium: 2 mg/dL (ref 1.7–2.4)

## 2021-04-03 LAB — PHOSPHORUS: Phosphorus: 3.7 mg/dL (ref 2.5–4.6)

## 2021-04-03 MED ORDER — FENTANYL CITRATE (PF) 100 MCG/2ML IJ SOLN
INTRAMUSCULAR | Status: AC | PRN
  Administered 2021-04-03 (×2): 50 ug via INTRAVENOUS

## 2021-04-03 MED ORDER — SODIUM CHLORIDE 0.9% FLUSH
5.0000 mL | Freq: Three times a day (TID) | INTRAVENOUS | Status: DC
  Administered 2021-04-03 – 2021-04-09 (×18): 5 mL

## 2021-04-03 MED ORDER — TRAVASOL 10 % IV SOLN
INTRAVENOUS | Status: AC
  Filled 2021-04-03: qty 561.6

## 2021-04-03 MED ORDER — FENTANYL CITRATE (PF) 100 MCG/2ML IJ SOLN
INTRAMUSCULAR | Status: AC
  Filled 2021-04-03: qty 2

## 2021-04-03 MED ORDER — NALOXONE HCL 0.4 MG/ML IJ SOLN
INTRAMUSCULAR | Status: AC
  Filled 2021-04-03: qty 1

## 2021-04-03 MED ORDER — MIDAZOLAM HCL 2 MG/2ML IJ SOLN
INTRAMUSCULAR | Status: AC | PRN
  Administered 2021-04-03 (×4): 1 mg via INTRAVENOUS

## 2021-04-03 MED ORDER — MIDAZOLAM HCL 2 MG/2ML IJ SOLN
INTRAMUSCULAR | Status: AC
  Filled 2021-04-03: qty 4

## 2021-04-03 MED ORDER — LIDOCAINE HCL 1 % IJ SOLN
INTRAMUSCULAR | Status: AC | PRN
Start: 2021-04-03 — End: 2021-04-03
  Administered 2021-04-03: 10 mL via INTRADERMAL

## 2021-04-03 MED ORDER — FLUMAZENIL 0.5 MG/5ML IV SOLN
INTRAVENOUS | Status: AC
  Filled 2021-04-03: qty 5

## 2021-04-03 NOTE — Progress Notes (Signed)
Rectal carcinoma ([place])  Subjective: Feels somewhat better today, no further vomiting after switching to liquids   Objective: Vital signs in last 24 hours: Temp:  [98 F (36.7 C)-100 F (37.8 C)] 98 F (36.7 C) (05/26 0637) Pulse Rate:  [92-107] 95 (05/26 0637) Resp:  [15-20] 15 (05/26 0637) BP: (121-154)/(51-74) 147/74 (05/26 0637) SpO2:  [99 %-100 %] 100 % (05/26 0637) Last BM Date: 04/02/21  Intake/Output from previous day: 05/25 0701 - 05/26 0700 In: 3636.3 [P.O.:540; I.V.:1079.3; Blood:1097; IV Piggyback:500] Out: 2939 [Urine:2425; Drains:284; Stool:230] Intake/Output this shift: No intake/output data recorded.  General appearance: alert and cooperative GI: normal findings: soft, no TTP, no peritonitis, less distention Incision/Wound: clean, dry, intact JP: purulent Ostomy: stool in bag LLQ drain: min purulent output RLQ drain: min cloudy SS fluid  Lab Results:  Results for orders placed or performed during the hospital encounter of 03/05/21 (from the past 24 hour(s))  Type and screen [place]     Status: None   Collection Time: 04/02/21 11:14 AM  Result Value Ref Range   ABO/RH(D) O POS    Antibody Screen NEG    Sample Expiration 04/05/2021,2359    Unit Number [id number]    Blood Component Type RED CELLS,LR    Unit division 00    Status of Unit ISSUED,FINAL    Transfusion Status OK TO TRANSFUSE    Crossmatch Result Compatible    Unit Number [id number]    Blood Component Type RED CELLS,LR    Unit division 00    Status of Unit ISSUED,FINAL    Transfusion Status OK TO TRANSFUSE    Crossmatch Result      Compatible Performed at [facility], [place] [address], [place], [place] [address]   Prepare RBC (crossmatch)     Status: None   Collection Time: 04/02/21 11:18 AM  Result Value Ref Range   Order Confirmation      ORDER PROCESSED BY BLOOD BANK Performed at [place] [address]  [place], [place] [address]   Comprehensive metabolic panel     Status: Abnormal   Collection Time: 04/03/21  3:19 AM  Result Value Ref Range   Sodium 133 (L) 135 - 145 mmol/L   Potassium 4.1 3.5 - 5.1 mmol/L   Chloride 101 98 - 111 mmol/L   CO2 27 22 - 32 mmol/L   Glucose, Bld 113 (H) 70 - 99 mg/dL   BUN 16 8 - 23 mg/dL   Creatinine, Ser 0.58 (L) 0.61 - 1.24 mg/dL   Calcium 8.8 (L) 8.9 - 10.3 mg/dL   Total Protein 6.8 6.5 - 8.1 g/dL   Albumin 1.9 (L) 3.5 - 5.0 g/dL   AST 19 15 - 41 U/L   ALT 22 0 - 44 U/L   Alkaline Phosphatase 91 38 - 126 U/L   Total Bilirubin 0.4 0.3 - 1.2 mg/dL   GFR, Estimated >60 >60 mL/min   Anion gap 5 5 - 15  Magnesium     Status: None   Collection Time: 04/03/21  3:19 AM  Result Value Ref Range   Magnesium 2.0 1.7 - 2.4 mg/dL  Phosphorus     Status: None   Collection Time: 04/03/21  3:19 AM  Result Value Ref Range   Phosphorus 3.7 2.5 - 4.6 mg/dL  CBC     Status: Abnormal   Collection Time: 04/03/21  3:19 AM  Result Value Ref Range   WBC 21.8 (H) 4.0 - 10.5  K/uL   RBC 3.34 (L) 4.22 - 5.81 MIL/uL   Hemoglobin 9.3 (L) 13.0 - 17.0 g/dL   HCT 28.3 (L) 39.0 - 52.0 %   MCV 84.7 80.0 - 100.0 fL   MCH 27.8 26.0 - 34.0 pg   MCHC 32.9 30.0 - 36.0 g/dL   RDW 16.7 (H) 11.5 - 15.5 %   Platelets 370 150 - 400 K/uL   nRBC 0.0 0.0 - 0.2 %  Protime-INR     Status: None   Collection Time: 04/03/21  3:19 AM  Result Value Ref Range   Prothrombin Time 15.0 11.4 - 15.2 seconds   INR 1.2 0.8 - 1.2     Studies/Results Radiology     MEDS, Scheduled . (feeding supplement) PROSource Plus  30 mL Oral Daily  . sodium chloride   Intravenous Once  . acetaminophen  1,000 mg Oral Q6H  . bacitracin   Topical BID  . chlorhexidine  15 mL Mouth Rinse BID  . Chlorhexidine Gluconate Cloth  6 each Topical Daily  . feeding supplement  1 Container Oral BID BM  . lip balm  1 application Topical BID  . mouth rinse  15 mL Mouth Rinse q12n4p  . melatonin  3 mg Oral QHS  .  multivitamin with minerals  1 tablet Oral Daily  . pantoprazole  40 mg Oral BID AC  . sodium chloride flush  10-40 mL Intracatheter Q12H  . sodium chloride flush  5 mL Intracatheter Q8H  . sodium chloride flush  5 mL Intracatheter Q8H     Assessment: Rectal carcinoma (HCC)  POD 20 LAR and diverting ostomy Post op abscess: IR drain in LLQ and RLQ, completed course of Zosyn and Eraxis.  Zosyn restarted for uncontrolled abd infection on 5/11.  Wbc remains elevated.  Switched to Unasyn  Hemorrhagic esophagitis, seems to be resolving  Plan: Cont 1/2 TPN, pt not eating much of his soft diet, nutritional shakes between meals Acute blood loss anemia: Hgb decreased yesterday, 2 units given, has had IV Fe and several transfusions ID:   Unasyn per culture sensitivities. Cont IR drains. CT shows pelvic fluid collection that sits around pelvic drain.  Pelvic drain flushed TID.  No change in wbc.  JP flushed with tpa yesterday by me. will have IR eval for replacement today RT for pulm toilet, no signs for pulmonary infection PT for ambulation IV and PO pain meds, attempt to wean IV dilaudid  cont PO PPI for esophagitis, will switch back to fulls for now     LOS: 29 days    Rosario Adie, MD  [facility], [place]    04/03/2021 7:46 AM

## 2021-04-03 NOTE — Procedures (Signed)
Pre procedural Dx: Pelvic abscess Post procedural Dx: Same  Technically successful CT guided placed of a 10 Fr drainage catheter placement into the pelvis via left transgluteal approach yielding 100 cc of purulent fluid.    A representative aspirated sample was capped and sent to the laboratory for analysis.    EBL: Trace Complications: None immediate  Ronny Bacon, MD Pager #: [phone]

## 2021-04-03 NOTE — Progress Notes (Signed)
PHARMACY - TOTAL PARENTERAL NUTRITION CONSULT NOTE   Indication: Prolonged ileus  Patient Measurements: Weight: 79.3 kg (174 lb 13.2 oz)   Body mass index is 25.08 kg/m.   Assessment: 70 yo male with rectal cancer s/p xi robotic assisted lower anterior resection with end colostomy on 4/27. Developed post-op abscess and ileus. Pharmacy consulted to initiate TPN on 5/2.  Glucose / Insulin: Glucose continues to be at goal < 180 from bmet, not currently on SSI or CBG checks Electrolytes: All lytes WNL except Na slightly low at 133; CorrCa (10.5) Renal: SCr and BUN stable, WNL Hepatic: AST/ALT and Tbili WNLTG 100 (5/23) Albumin/prealbumin: alb 1.9, prealb 8.3 (5/23) down Intake / Output; MIVF:  - I/O last 24hr net:+697 ml (transfused ~ 1 L) - Drain output 284 mL  - Colostomy output: 230 ml - UOP 2425 mL - D5 1/4NS at [facility] per Md GI Imaging: - 5/11: New right abdominopelvic loculated ascites, suspicious for developing abscess or infected ascites -5/22: Repeat Abdominal CT ordered GI Surgeries / Procedures:  - 4/27:  xi robotic assisted lower anterior resection with end colostomy on 4/27 - 5/6: NGT out and unable to be replaced thus far -5/12: CT guided placed of a 12 Fr drainage catheter placement into the right lower abdomen/pelvis yielding 75 cc of purulent appearing fluid  Central access: 5/2 TPN start date: 5/2  Nutritional Goals (per RD recommendation on 5/17):   2200-2400 kcal/day, protein 90-115 g/day, and >2L/day  Goal TPN rate is 90 mL/hr (provides 112 g of protein and 2199 kcals per day)  Current Nutrition:  - TPN at 1/2 of goal rate - Soft diet, little intake per Dr. Marcello Moores; meal intake not charted - Boost Breeze daily (250kcal, 9g protein); Prosource Plus daily (100 kcal, 15g protein)  Plan:  At 18:00  Continue with half rate TPN at 59mL/hr  Provides 162 g dextrose, 56 g protein, 1100 kcal  Electrolytes in TPN:   Inc Na 75 mEq/L  K 60 mEq/L   Ca  15mEq/L  Mg 7.28mEq/L  Phos 20 mmol/L  Ratio 1 Cl: 2 Ac   Standard MVI and trace elements ordered PO  D5 1/4 NS at 10 ml/hr per MD  TPN labs on Mon/Thurs.   Ulice Dash D 04/03/21 7:41 AM

## 2021-04-03 NOTE — Progress Notes (Signed)
PT Cancellation Note  Patient Details Name: Shane [name] MRN: [id number] DOB: 1951-04-02   Cancelled Treatment:    Reason Eval/Treat Not Completed: Other (comment) (pt declined PT at present, he stated he's about to go to a procedure. Will follow.)   Philomena Doheny PT 04/03/2021  Acute Rehabilitation Services Pager [phone] Office [phone]

## 2021-04-04 MED ORDER — TRAVASOL 10 % IV SOLN
INTRAVENOUS | Status: AC
  Filled 2021-04-04: qty 561.6

## 2021-04-04 NOTE — Progress Notes (Signed)
Referring Physician(s): Gross,S  Supervising Physician: Corrie Mckusick  Patient Status:  [facility] - In-pt  Chief Complaint:  Abdominal/buttocks pain/ abscesses  Subjective: Patient's main complaint at this time is tenderness around transgluteal drain site as expected; denies nausea or vomiting.   Allergies: Nsaids  Medications: Prior to Admission medications   Medication Sig Start Date End Date Taking? Authorizing Provider  Diclofenac Sodium 2 % SOLN Place 2 g onto the skin 2 (two) times daily. Patient taking differently: Place 2 g onto the skin daily as needed (pain). 06/30/19  Yes Lyndal Pulley, DO  docusate sodium (COLACE) 100 MG capsule Take 100 mg by mouth daily as needed for mild constipation.    Yes [provider]  HYDROcodone-acetaminophen (NORCO/VICODIN) 5-325 MG tablet Take 1 tablet by mouth every 6 (six) hours as needed for moderate pain. 02/12/21  Yes Owens Shark, NP  hydrocortisone (ANUSOL-HC) 2.5 % rectal cream Place 1 application rectally 2 (two) times daily. Patient taking differently: Place 1 application rectally daily as needed for hemorrhoids or anal itching. 01/28/21  Yes Marrian Salvage, FNP  lidocaine-prilocaine (EMLA) cream Apply 1 application topically as needed. Patient taking differently: Apply 1 application topically as needed (prior to port use). 10/21/20  Yes Ladell Pier, MD  pravastatin (PRAVACHOL) 20 MG tablet TAKE 1 TABLET(20 MG) BY MOUTH DAILY Patient taking differently: Take 20 mg by mouth daily. 11/12/20  Yes Marrian Salvage, FNP  capecitabine (XELODA) 500 MG tablet TAKE 2000 MG (4 TABLETS) EVERY MORNING AND 1500 MG (3 TABLETS) EVERY EVENING FOR A TOTAL DAILY DOSE OF 3500 MG ON RADIATION DAYS ONLY. Patient not taking: No sig reported 11/04/20 11/04/21  Ladell Pier, MD  diphenoxylate-atropine (LOMOTIL) 2.5-0.025 MG tablet Take 1-2 tablets by mouth 3 (three) times daily as needed for diarrhea or loose stools (Maxiumum  of 6/day). Patient not taking: No sig reported 12/25/20   Ladell Pier, MD  ondansetron (ZOFRAN) 8 MG tablet Take 1 tablet (8 mg total) by mouth every 8 (eight) hours as needed for nausea or vomiting. Start 72 hours after each IV chemotherapy treatment 07/30/20   Ladell Pier, MD  prochlorperazine (COMPAZINE) 10 MG tablet Take 1 tablet (10 mg total) by mouth every 6 (six) hours as needed for nausea. 07/30/20   Ladell Pier, MD     Vital Signs: BP (!) 148/63 (BP Location: Left Arm)   Pulse 97   Temp 98.6 F (37 C) (Oral)   Resp 17   Wt 174 lb 13.2 oz (79.3 kg)   SpO2 100%   BMI 25.08 kg/m   Physical Exam awake, alert.  All abdominal/pelvic drains intact, outputs yesterday ranged from 10 to 100 cc; currently with minimal fluid in all drains with exception of RLQ surgical drain which has 70 cc output  Imaging: CT IMAGE GUIDED DRAINAGE BY PERCUTANEOUS CATHETER  Result Date: 04/03/2021 INDICATION: History of rectal cancer, post LAR with end colostomy complicated by development of postoperative abscesses post ultrasound-guided placement of left upper quadrant percutaneous drainage catheter 03/12/2021 as well as CT guided right lower quadrant percutaneous drainage catheter placement 03/20/2021. EXAM: CT IMAGE GUIDED DRAINAGE BY PERCUTANEOUS CATHETER COMPARISON:  CT abdomen and pelvis-03/30/2021; 03/19/2021; 03/11/2021 Ultrasound-guided left upper quadrant per drainage catheter placement - 03/12/2021 CT-guided right lower quadrant percutaneous drainage catheter placement - 03/20/2021 MEDICATIONS: The patient is currently admitted to the hospital and receiving intravenous antibiotics. The antibiotics were administered within an appropriate time frame prior to the initiation  of the procedure. ANESTHESIA/SEDATION: Moderate (conscious) sedation was employed during this procedure. A total of Versed 4 mg and Fentanyl 100 mcg was administered intravenously. Moderate Sedation Time: 20 minutes. The  patient's level of consciousness and vital signs were monitored continuously by radiology nursing throughout the procedure under my direct supervision. CONTRAST:  None COMPLICATIONS: None immediate. PROCEDURE: Informed written consent was obtained from the patient after a discussion of the risks, benefits and alternatives to treatment. The patient was placed prone on the CT gantry and a pre procedural CT was performed re-demonstrating the known abscess/fluid collection within the pelvic cul-de-sac. The procedure was planned. A timeout was performed prior to the initiation of the procedure. The left buttocks was prepped and draped in the usual sterile fashion. The overlying soft tissues were anesthetized with 1% lidocaine with epinephrine. Appropriate trajectory was planned with the use of a 22 gauge spinal needle. An 18 gauge trocar needle was advanced into the abscess/fluid collection and a short Amplatz super stiff wire was coiled within the collection. Appropriate positioning was confirmed with a limited CT scan. The tract was serially dilated allowing placement of a 10 [place] all-purpose drainage catheter. Appropriate positioning was confirmed with a limited postprocedural CT scan. Approximately 100 ml of purulent fluid was aspirated. The tube was connected to a drainage bag and sutured in place. A dressing was placed. The patient tolerated the procedure well without immediate post procedural complication. IMPRESSION: Successful CT guided placement of a 10 French all purpose drain catheter into the pelvic cul-de-sac via left trans gluteal approach with aspiration of 100 mL of purulent fluid. Samples were sent to the laboratory as requested by the ordering clinical team. Electronically Signed   By: Sandi Mariscal M.D.   On: 04/03/2021 17:14    Labs:  CBC: Recent Labs    03/31/21 0501 04/02/21 0425 04/02/21 0745 04/03/21 0319  WBC 21.9* 23.1* 22.0* 21.8*  HGB 7.6* 6.8* 6.8* 9.3*  HCT 23.7* 21.5* 21.2*  28.3*  PLT 497* 421* 432* 370    COAGS: Recent Labs    03/20/21 0313 04/03/21 0319  INR 1.3* 1.2    BMP: Recent Labs    07/04/20 0845 07/15/20 0048 08/13/20 0906 03/28/21 0321 03/30/21 0455 03/31/21 0501 04/03/21 0319  NA 138 136   < > 136 135 134* 133*  K 3.8 3.7   < > 3.7 3.7 4.0 4.1  CL 108 106   < > 106 104 102 101  CO2 25 21*   < > 26 26 26 27   GLUCOSE 105* 100*   < > 111* 108* 113* 113*  BUN 12 21   < > 16 13 16 16   CALCIUM 9.9 9.2   < > 8.4* 8.6* 8.6* 8.8*  CREATININE 0.96 0.94   < > 0.68 0.68 0.72 0.58*  GFRNONAA >60 >60   < > >60 >60 >60 >60  GFRAA >60 >60  --   --   --   --   --    < > = values in this interval not displayed.    LIVER FUNCTION TESTS: Recent Labs    03/24/21 0340 03/27/21 0354 03/31/21 0501 04/03/21 0319  BILITOT 0.1* 0.5 0.2* 0.4  AST 29 26 14* 19  ALT 30 29 18 22   ALKPHOS 130* 138* 99 91  PROT 6.9 6.8 6.8 6.8  ALBUMIN 2.0* 2.0* 2.0* 1.9*    Assessment and Plan: Patient with history of rectal cancer, status post LAR with end colostomy;subsequently developedpostop left upper  quadrant abdabscess, s/pdrain placementon 5/3; statuspost drainage of right lower abdominal quadrant/pelvic abscess on 5/12; status post drainage of pelvic cul-de-sac abscess via left transgluteal approach on 5/26; afebrile, CBC today pending; pelvic cul-de-sac fluid cultures pending; continue current treatment/plans as per CCS; once drain outputs are minimal over at least 2 to 3-day span could consider follow-up imaging   Electronically Signed: D. Rowe Robert, PA-C 04/04/2021, 1:22 PM   I spent a total of 15 minutes at the the patient's bedside AND on the patient's hospital floor or unit, greater than 50% of which was counseling/coordinating care for abdominal/pelvic abscess drains    [name], male   DOB: [birth year], 70 y.o.   MRN: [id number]

## 2021-04-04 NOTE — Progress Notes (Signed)
PHARMACY - TOTAL PARENTERAL NUTRITION CONSULT NOTE   Indication: Prolonged ileus  Patient Measurements: Weight: 79.3 kg (174 lb 13.2 oz)   Body mass index is 25.08 kg/m.   Assessment: 70 yo male with rectal cancer s/p xi robotic assisted lower anterior resection with end colostomy on 4/27. Developed post-op abscess and ileus. Pharmacy consulted to initiate TPN on 5/2.  Glucose / Insulin: Glucose continues to be at goal < 180 from bmet, not currently on SSI or CBG checks Electrolytes: No new labs. 5/26: All lytes WNL except Na slightly low at 133; CorrCa (10.5) Renal: SCr and BUN stable, WNL Hepatic: AST/ALT and Tbili WNLTG 100 (5/23) Albumin/prealbumin: alb 1.9, prealb 8.3 (5/23) down Intake / Output; MIVF:  - I/O last 24hr net: -681 mL - Drain output 225 mL  - Colostomy output: 90 mL - UOP 2575 mL - D5 1/4NS at [facility] per Md GI Imaging: - 5/11: New right abdominopelvic loculated ascites, suspicious for developing abscess or infected ascites -5/22: Repeat Abdominal CT: pelvic fluid collection/abscess GI Surgeries / Procedures:  - 4/27:  xi robotic assisted lower anterior resection with end colostomy on 4/27 - 5/6: NGT out and unable to be replaced thus far -5/12: CT guided placed of a 12 Fr drainage catheter placement into the right lower abdomen/pelvis yielding 75 cc of purulent appearing fluid -5/26: IR placement of drain for pelvic abscess  Central access: 5/2 TPN start date: 5/2  Nutritional Goals (per RD recommendation on 5/17):   2200-2400 kcal/day, protein 90-115 g/day, and >2L/day  Goal TPN rate is 90 mL/hr (provides 112 g of protein and 2199 kcals per day)  Current Nutrition:  - TPN at 1/2 of goal rate - Diet: Full liquid; no meal intake charted - Boost Breeze (250kcal, 9g protein) BID; Prosource Plus daily (100 kcal, 15g protein)  Plan:  At 18:00  Continue with half rate TPN at 27mL/hr  Provides 162 g dextrose, 56 g protein, 1100 kcal  Electrolytes in TPN:  No changes  Na 75 mEq/L  K 60 mEq/L   Ca 96mEq/L  Mg 7.79mEq/L  Phos 20 mmol/L  Ratio 1 Cl: 2 Ac   Standard MVI and trace elements ordered PO  D5 1/4 NS at 10 ml/hr per MD  TPN labs on Mon/Thurs.   Lenis Noon, PharmD 04/04/21 8:36 AM

## 2021-04-04 NOTE — Evaluation (Signed)
Physical Therapy Evaluation Patient Details Name: Shane [name] MRN: [id number] DOB: [birth year] Today's Date: 04/04/2021   History of Present Illness  The patient is a 70 year old male who presents with colorectal cancer, in  ED 03/05/21 with complaints of hemorrhoids and rectal bleeding, anemia. CT showed a large rectal mass. S/P ROBOTIC ASSISTED LOWER ANTERIOR RESECTION WITH END COLOSTOMY  MOBILIZATION OF THE SPLENIC FLEXURE  on 03/05/21, post op tachycardia and hypotension and abscess-drains placed.  Clinical Impression  Pt progressing, up with encouragement. Some pain initially from drain that was placed yesterday. Improved tolerance to activity noted by PT and pt, decr DOE 92/4)    Follow Up Recommendations Home health PT    Equipment Recommendations  None recommended by PT (has RW)    Recommendations for Other Services       Precautions / Restrictions Precautions Precautions: Fall Precaution Comments: colostomy,  5 drains Restrictions Weight Bearing Restrictions: No      Mobility  Bed Mobility Overal bed mobility: Modified Independent Bed Mobility: Supine to Sit                Transfers Overall transfer level: Needs assistance Equipment used: Rolling walker (2 wheeled) Transfers: Sit to/from Stand Sit to Stand: Min guard;Supervision         General transfer comment: cues for hand placement  Ambulation/Gait Ambulation/Gait assistance: Min guard;Supervision Gait Distance (Feet): 380 Feet Assistive device: Rolling walker (2 wheeled) Gait Pattern/deviations: Step-through pattern;Decreased stride length;Steppage (appears steppage (esp on R)  but able to df)     General Gait Details: steady, slight drifting however  no overt LOB, distance to tolerance  Stairs            Wheelchair Mobility    Modified Rankin (Stroke Patients Only)       Balance Overall balance assessment: Needs assistance Sitting-balance support: Feet supported;No upper  extremity supported Sitting balance-Leahy Scale: Good       Standing balance-Leahy Scale: Fair                               Pertinent Vitals/Pain Pain Assessment: Faces Faces Pain Scale: Hurts little more Pain Location: L glut Pain Descriptors / Indicators: Discomfort;Grimacing Pain Intervention(s): Limited activity within patient's tolerance;Monitored during session;Repositioned    Home Living                        Prior Function                 Hand Dominance        Extremity/Trunk Assessment                Communication      Cognition Arousal/Alertness: Awake/alert Behavior During Therapy: WFL for tasks assessed/performed Overall Cognitive Status: Within Functional Limits for tasks assessed                                        General Comments      Exercises     Assessment/Plan    PT Assessment    PT Problem List         PT Treatment Interventions      PT Goals (Current goals can be found in the Care Plan section)  Acute Rehab PT Goals Patient Stated Goal: to do yardwork, walk more frequently PT  Goal Formulation: With patient/family Time For Goal Achievement: 04/09/21 Potential to Achieve Goals: Good    Frequency Min 3X/week   Barriers to discharge        Co-evaluation               AM-PAC PT "6 Clicks" Mobility  Outcome Measure Help needed turning from your back to your side while in a flat bed without using bedrails?: A Little Help needed moving from lying on your back to sitting on the side of a flat bed without using bedrails?: A Little Help needed moving to and from a bed to a chair (including a wheelchair)?: A Little Help needed standing up from a chair using your arms (e.g., wheelchair or bedside chair)?: A Little Help needed to walk in hospital room?: A Little Help needed climbing 3-5 steps with a railing? : A Little 6 Click Score: 18    End of Session Equipment Utilized  During Treatment: Gait belt Activity Tolerance: Patient tolerated treatment well Patient left: in chair;with call bell/phone within reach   PT Visit Diagnosis: Muscle weakness (generalized) (M62.81);Difficulty in walking, not elsewhere classified (R26.2)    Time: 2951-8841 PT Time Calculation (min) (ACUTE ONLY): 29 min   Charges:     PT Treatments $Gait Training: 23-37 mins        Baxter Flattery, PT  Acute Rehab Dept ([place]) [phone] Pager [phone]  04/04/2021   [facility] 04/04/2021, 1:38 PM

## 2021-04-04 NOTE — Progress Notes (Signed)
Rectal carcinoma ([place])  Subjective: Feels better today, had drain placed yesterday  Objective: Vital signs in last 24 hours: Temp:  [97.4 F (36.3 C)-99.3 F (37.4 C)] 98.6 F (37 C) (05/27 0529) Pulse Rate:  [90-110] 97 (05/27 0529) Resp:  [15-31] 17 (05/27 0529) BP: (136-158)/(63-77) 148/63 (05/27 0529) SpO2:  [99 %-100 %] 100 % (05/27 0529) Last BM Date: 04/03/21  Intake/Output from previous day: 05/26 0701 - 05/27 0700 In: 2208.7 [P.O.:480; I.V.:1333.7; IV Piggyback:300] Out: 2890 [Urine:2575; Drains:225; Stool:90] Intake/Output this shift: No intake/output data recorded.  General appearance: alert and cooperative GI: normal findings: soft, no TTP, no peritonitis, less distention Incision/Wound: clean, dry, intact JP: purulent Ostomy: stool in bag LLQ drain: min purulent output RLQ drain: min cloudy SS fluid TG drain: no output Lab Results:  No results found for this or any previous visit (from the past 24 hour(s)).   Studies/Results Radiology     MEDS, Scheduled . (feeding supplement) PROSource Plus  30 mL Oral Daily  . sodium chloride   Intravenous Once  . acetaminophen  1,000 mg Oral Q6H  . bacitracin   Topical BID  . chlorhexidine  15 mL Mouth Rinse BID  . Chlorhexidine Gluconate Cloth  6 each Topical Daily  . feeding supplement  1 Container Oral BID BM  . lip balm  1 application Topical BID  . mouth rinse  15 mL Mouth Rinse q12n4p  . melatonin  3 mg Oral QHS  . multivitamin with minerals  1 tablet Oral Daily  . pantoprazole  40 mg Oral BID AC  . sodium chloride flush  10-40 mL Intracatheter Q12H  . sodium chloride flush  5 mL Intracatheter Q8H  . sodium chloride flush  5 mL Intracatheter Q8H  . sodium chloride flush  5 mL Intracatheter Q8H     Assessment: Rectal carcinoma (HCC)  POD 20 LAR and diverting ostomy Post op abscess: IR drain in LLQ and RLQ, completed course of Zosyn and Eraxis.  Zosyn restarted for uncontrolled abd infection on 5/11  and then switched to Unasyn per cultures.   Hemorrhagic esophagitis, seems to be resolving  Plan: Cont 1/2 TPN, pt not eating much of his soft diet, nutritional shakes between meals Acute blood loss anemia: Hgb decreased yesterday, 2 units given, has had IV Fe and several transfusions ID:   Unasyn per culture sensitivities. Cont IR drains. CT shows pelvic fluid collection that sits around pelvic drain.  Pelvic drain flushed TID. RT for pulm toilet, no signs for pulmonary infection PT for ambulation IV and PO pain meds, attempt to wean IV dilaudid  cont PO PPI for esophagitis, will stay with fulls for now   Hopefully Pt can tolerate enough PO to stop TPN.   Once pelvic drains clear, we will d/c foley and have a voiding trial.   LOS: 30 days    Rosario Adie, MD  [facility], [place]    04/04/2021 8:07 AM

## 2021-04-05 LAB — CBC
HCT: 31.3 % — ABNORMAL LOW (ref 39.0–52.0)
Hemoglobin: 9.9 g/dL — ABNORMAL LOW (ref 13.0–17.0)
MCH: 27 pg (ref 26.0–34.0)
MCHC: 31.6 g/dL (ref 30.0–36.0)
MCV: 85.5 fL (ref 80.0–100.0)
Platelets: 428 10*3/uL — ABNORMAL HIGH (ref 150–400)
RBC: 3.66 MIL/uL — ABNORMAL LOW (ref 4.22–5.81)
RDW: 17.4 % — ABNORMAL HIGH (ref 11.5–15.5)
WBC: 20.7 10*3/uL — ABNORMAL HIGH (ref 4.0–10.5)
nRBC: 0 % (ref 0.0–0.2)

## 2021-04-05 MED ORDER — TRAVASOL 10 % IV SOLN
INTRAVENOUS | Status: AC
  Filled 2021-04-05: qty 561.6

## 2021-04-05 MED ORDER — SODIUM CHLORIDE 0.9 % IV SOLN: INTRAVENOUS | Status: DC

## 2021-04-05 NOTE — Plan of Care (Signed)
  Problem: Education: Goal: Knowledge of ostomy care will improve Outcome: Progressing   Problem: Nutrition: Goal: Will attain and maintain optimal nutritional status will improve Outcome: Progressing   Problem: Clinical Measurements: Goal: Postoperative complications will be avoided or minimized Outcome: Progressing

## 2021-04-05 NOTE — Progress Notes (Signed)
22 Days Post-Op   Subjective/Chief Complaint: Weak but feels okay.   Objective: Vital signs in last 24 hours: Temp:  [98 F (36.7 C)-99.7 F (37.6 C)] 98.8 F (37.1 C) (05/28 0830) Pulse Rate:  [96-111] 102 (05/28 0830) Resp:  [14-17] 17 (05/28 0830) BP: (148-155)/(65-75) 155/69 (05/28 0830) SpO2:  [98 %-100 %] 98 % (05/28 0830) Weight:  [79.4 kg] 79.4 kg (05/28 0532) Last BM Date: 04/04/21  Intake/Output from previous day: 05/27 0701 - 05/28 0700 In: 2268.4 [P.O.:720; I.V.:993.4; IV Piggyback:500] Out: 2865 [Urine:2475; Drains:165; Stool:225] Intake/Output this shift: No intake/output data recorded.  General appearance: alert and cooperative Resp: clear to auscultation bilaterally Cardio: regular rate and rhythm, S1, S2 normal, no murmur, click, rub or gallop Incision/Wound: Ostomy functioning.  Wound is dressed.  Soft nontender.  PERC drains are noted left upper quadrant and right lower quadrant.  Purulent drainage noted minimal output  Lab Results:  Recent Labs    04/03/21 0319 04/05/21 0343  WBC 21.8* 20.7*  HGB 9.3* 9.9*  HCT 28.3* 31.3*  PLT 370 428*   BMET Recent Labs    04/03/21 0319  NA 133*  K 4.1  CL 101  CO2 27  GLUCOSE 113*  BUN 16  CREATININE 0.58*  CALCIUM 8.8*   PT/INR Recent Labs    04/03/21 0319  LABPROT 15.0  INR 1.2   ABG No results for input(s): PHART, HCO3 in the last 72 hours.  Invalid input(s): PCO2, PO2  Studies/Results: CT IMAGE GUIDED DRAINAGE BY PERCUTANEOUS CATHETER  Result Date: 04/03/2021 INDICATION: History of rectal cancer, post LAR with end colostomy complicated by development of postoperative abscesses post ultrasound-guided placement of left upper quadrant percutaneous drainage catheter 03/12/2021 as well as CT guided right lower quadrant percutaneous drainage catheter placement 03/20/2021. EXAM: CT IMAGE GUIDED DRAINAGE BY PERCUTANEOUS CATHETER COMPARISON:  CT abdomen and pelvis-03/30/2021; 03/19/2021; 03/11/2021  Ultrasound-guided left upper quadrant per drainage catheter placement - 03/12/2021 CT-guided right lower quadrant percutaneous drainage catheter placement - 03/20/2021 MEDICATIONS: The patient is currently admitted to the hospital and receiving intravenous antibiotics. The antibiotics were administered within an appropriate time frame prior to the initiation of the procedure. ANESTHESIA/SEDATION: Moderate (conscious) sedation was employed during this procedure. A total of Versed 4 mg and Fentanyl 100 mcg was administered intravenously. Moderate Sedation Time: 20 minutes. The patient's level of consciousness and vital signs were monitored continuously by radiology nursing throughout the procedure under my direct supervision. CONTRAST:  None COMPLICATIONS: None immediate. PROCEDURE: Informed written consent was obtained from the patient after a discussion of the risks, benefits and alternatives to treatment. The patient was placed prone on the CT gantry and a pre procedural CT was performed re-demonstrating the known abscess/fluid collection within the pelvic cul-de-sac. The procedure was planned. A timeout was performed prior to the initiation of the procedure. The left buttocks was prepped and draped in the usual sterile fashion. The overlying soft tissues were anesthetized with 1% lidocaine with epinephrine. Appropriate trajectory was planned with the use of a 22 gauge spinal needle. An 18 gauge trocar needle was advanced into the abscess/fluid collection and a short Amplatz super stiff wire was coiled within the collection. Appropriate positioning was confirmed with a limited CT scan. The tract was serially dilated allowing placement of a 10 [place] all-purpose drainage catheter. Appropriate positioning was confirmed with a limited postprocedural CT scan. Approximately 100 ml of purulent fluid was aspirated. The tube was connected to a drainage bag and sutured in place. A dressing  was placed. The patient tolerated  the procedure well without immediate post procedural complication. IMPRESSION: Successful CT guided placement of a 10 French all purpose drain catheter into the pelvic cul-de-sac via left trans gluteal approach with aspiration of 100 mL of purulent fluid. Samples were sent to the laboratory as requested by the ordering clinical team. Electronically Signed   By: Sandi Mariscal M.D.   On: 04/03/2021 17:14    Anti-infectives: Anti-infectives (From admission, onward)   Start     Dose/Rate Route Frequency Ordered Stop   03/28/21 1000  Ampicillin-Sulbactam (UNASYN) 3 g in sodium chloride 0.9 % 100 mL IVPB        3 g 200 mL/hr over 30 Minutes Intravenous Every 6 hours 03/28/21 0914     03/19/21 1000  piperacillin-tazobactam (ZOSYN) IVPB 3.375 g  Status:  Discontinued        3.375 g 12.5 mL/hr over 240 Minutes Intravenous Every 8 hours 03/19/21 0855 03/28/21 0914   03/13/21 1400  piperacillin-tazobactam (ZOSYN) IVPB 3.375 g        3.375 g 12.5 mL/hr over 240 Minutes Intravenous Every 8 hours 03/13/21 1259 03/16/21 2200   03/12/21 1000  anidulafungin (ERAXIS) 100 mg in sodium chloride 0.9 % 100 mL IVPB       "Followed by" Linked Group Details   100 mg 78 mL/hr over 100 Minutes Intravenous Every 24 hours 03/11/21 0941 03/16/21 1050   03/11/21 1030  anidulafungin (ERAXIS) 100 mg in sodium chloride 0.9 % 100 mL IVPB  Status:  Discontinued        100 mg 78 mL/hr over 100 Minutes Intravenous Every 24 hours 03/11/21 0937 03/11/21 0941   03/11/21 1030  anidulafungin (ERAXIS) 200 mg in sodium chloride 0.9 % 200 mL IVPB       "Followed by" Linked Group Details   200 mg 78 mL/hr over 200 Minutes Intravenous  Once 03/11/21 0941 03/11/21 1540   03/08/21 0030  piperacillin-tazobactam (ZOSYN) IVPB 3.375 g        3.375 g 12.5 mL/hr over 240 Minutes Intravenous Every 8 hours 03/08/21 0016 03/12/21 2204   03/05/21 2200  cefoTEtan (CEFOTAN) 2 g in sodium chloride 0.9 % 100 mL IVPB        2 g 200 mL/hr over 30  Minutes Intravenous Every 12 hours 03/05/21 1841 03/05/21 2258   03/05/21 0730  cefoTEtan (CEFOTAN) 2 g in sodium chloride 0.9 % 100 mL IVPB        2 g 200 mL/hr over 30 Minutes Intravenous On call to O.R. 03/05/21 0729 03/05/21 0926      Assessment/Plan: Rectal carcinoma (HCC)  POD 20 LAR and diverting ostomy Post op abscess: IR drain in LLQ and RLQ, completed course of Zosyn and Eraxis.  Zosyn restarted for uncontrolled abd infection on 5/11 and then switched to Unasyn per cultures.   Hemorrhagic esophagitis, seems to be resolving   Continue supportive care for now.  Wean TNA as able.  Encourage p.o. intake.  Remove Foley once JP drainage clears   LOS: 31 days    Marcello Moores A Catelynn Sparger 04/05/2021

## 2021-04-05 NOTE — Progress Notes (Addendum)
PHARMACY - TOTAL PARENTERAL NUTRITION CONSULT NOTE   Indication: Prolonged ileus  Patient Measurements: Weight: 79.4 kg (175 lb 0.7 oz)   Body mass index is 25.12 kg/m.   Assessment: 70 yo male with rectal cancer s/p Xi robotic assisted lower anterior resection with end colostomy on 4/27. Developed post-op abscess and ileus. Pharmacy consulted to initiate TPN on 5/2.  Glucose / Insulin: Glucose continues to be at goal < 180 from bmet, not currently on SSI or CBG checks Electrolytes: No new labs. 5/26: All lytes WNL except Na slightly low at 133; CorrCa (10.5) Renal: SCr and BUN stable, WNL (5/26); UOP remains robust Hepatic: AST/ALT and Tbili WNLTG 100 (5/23) Albumin/prealbumin: alb 1.9, prealb 8.3 (5/23) down Intake / Output; MIVF:  - I/O last 24hr net: - ~800 ml - Drain output 165 mL  - Colostomy output: 250 mL - D5 1/4NS at [facility] per Md GI Imaging: - 5/11: New right abdominopelvic loculated ascites, suspicious for developing abscess or infected ascites -5/22: Repeat Abdominal CT: pelvic fluid collection/abscess GI Surgeries / Procedures:  - 4/27:  xi robotic assisted lower anterior resection with end colostomy on 4/27 - 5/6: NGT out and unable to be replaced thus far -5/12: CT guided placed of a 12 Fr drainage catheter placement into the right lower abdomen/pelvis yielding 75 cc of purulent appearing fluid -5/26: IR placement of drain for pelvic abscess  Central access: 5/2 TPN start date: 5/2  Nutritional Goals (per RD recommendation on 5/17):   2200-2400 kcal/day, protein 90-115 g/day, and >2L/day  Goal TPN rate is 90 mL/hr (provides 112 g of protein and 2199 kcals per day)  Current Nutrition:  - TPN at 1/2 of goal rate - Diet: Full liquid; no meal intake charted - Boost Breeze (250kcal, 9g protein) BID; Prosource Plus daily (100 kcal, 15g protein)  Plan:  At 18:00  Continue with half rate TPN at 104mL/hr  Provides 162 g dextrose, 56 g protein, 1100  kcal  Electrolytes in TPN: Incr Na, adj Cl:Ac ratio  Na 100 mEq/L  K 60 mEq/L   Ca 62mEq/L  Mg 7.21mEq/L  Phos 20 mmol/L  Ratio 1 Cl: 1 Ac   Standard MVI and trace elements ordered PO  Change D5-1/4NS to NS; continue KVO  TPN labs on Mon/Thurs.   Cary Lothrop A, PharmD 04/05/21 10:28 AM

## 2021-04-06 LAB — AEROBIC/ANAEROBIC CULTURE W GRAM STAIN (SURGICAL/DEEP WOUND)

## 2021-04-06 MED ORDER — DICLOFENAC SODIUM 1 % EX GEL
2.0000 g | Freq: Three times a day (TID) | CUTANEOUS | Status: DC | PRN
  Administered 2021-04-06: 2 g via TOPICAL
  Filled 2021-04-06: qty 100

## 2021-04-06 MED ORDER — POTASSIUM ACETATE 2 MEQ/ML IV SOLN
INTRAVENOUS | Status: AC
  Filled 2021-04-06: qty 561.6

## 2021-04-06 NOTE — Progress Notes (Addendum)
Referring Physician(s): Dr. Clyda Greener  Supervising Physician: Ruthann Cancer  Patient Status:  [facility] - In-pt  Chief Complaint:  Intra abdominal/ buttock abscess s/p abscess drain placement X 3  Subjective:  Patient denies any complaints at this time.   Allergies: Nsaids  Medications: Prior to Admission medications   Medication Sig Start Date End Date Taking? Authorizing Provider  Diclofenac Sodium 2 % SOLN Place 2 g onto the skin 2 (two) times daily. Patient taking differently: Place 2 g onto the skin daily as needed (pain). 06/30/19  Yes Lyndal Pulley, DO  docusate sodium (COLACE) 100 MG capsule Take 100 mg by mouth daily as needed for mild constipation.    Yes [provider]  HYDROcodone-acetaminophen (NORCO/VICODIN) 5-325 MG tablet Take 1 tablet by mouth every 6 (six) hours as needed for moderate pain. 02/12/21  Yes Owens Shark, NP  hydrocortisone (ANUSOL-HC) 2.5 % rectal cream Place 1 application rectally 2 (two) times daily. Patient taking differently: Place 1 application rectally daily as needed for hemorrhoids or anal itching. 01/28/21  Yes Marrian Salvage, FNP  lidocaine-prilocaine (EMLA) cream Apply 1 application topically as needed. Patient taking differently: Apply 1 application topically as needed (prior to port use). 10/21/20  Yes Ladell Pier, MD  pravastatin (PRAVACHOL) 20 MG tablet TAKE 1 TABLET(20 MG) BY MOUTH DAILY Patient taking differently: Take 20 mg by mouth daily. 11/12/20  Yes Marrian Salvage, FNP  capecitabine (XELODA) 500 MG tablet TAKE 2000 MG (4 TABLETS) EVERY MORNING AND 1500 MG (3 TABLETS) EVERY EVENING FOR A TOTAL DAILY DOSE OF 3500 MG ON RADIATION DAYS ONLY. Patient not taking: No sig reported 11/04/20 11/04/21  Ladell Pier, MD  diphenoxylate-atropine (LOMOTIL) 2.5-0.025 MG tablet Take 1-2 tablets by mouth 3 (three) times daily as needed for diarrhea or loose stools (Maxiumum of 6/day). Patient not taking: No sig  reported 12/25/20   Ladell Pier, MD  ondansetron (ZOFRAN) 8 MG tablet Take 1 tablet (8 mg total) by mouth every 8 (eight) hours as needed for nausea or vomiting. Start 72 hours after each IV chemotherapy treatment 07/30/20   Ladell Pier, MD  prochlorperazine (COMPAZINE) 10 MG tablet Take 1 tablet (10 mg total) by mouth every 6 (six) hours as needed for nausea. 07/30/20   Ladell Pier, MD     Vital Signs: BP (!) 145/64 (BP Location: Left Arm)   Pulse 99   Temp 98.1 F (36.7 C) (Oral)   Resp 18   Wt 175 lb 0.7 oz (79.4 kg)   SpO2 100%   BMI 25.12 kg/m   Physical Exam Vitals and nursing note reviewed.  Constitutional:      Appearance: He is well-developed.  HENT:     Head: Normocephalic.  Pulmonary:     Effort: Pulmonary effort is normal.  Genitourinary:    Comments: All drain with scant output in the gravity bags at time of evaluation. Left lateral abdominal's output is yellow in nature. Right lateral drain yellow output with some debris noted. The most recent drain the 10 Fr. Buttock has thick gray output. Dressing clean dry and intact. All drains are easily flushed.  Musculoskeletal:        General: Normal range of motion.     Cervical back: Normal range of motion.  Skin:    General: Skin is dry.  Neurological:     Mental Status: He is alert and oriented to person, place, and time.     Imaging:  CT IMAGE GUIDED DRAINAGE BY PERCUTANEOUS CATHETER  Result Date: 04/03/2021 INDICATION: History of rectal cancer, post LAR with end colostomy complicated by development of postoperative abscesses post ultrasound-guided placement of left upper quadrant percutaneous drainage catheter 03/12/2021 as well as CT guided right lower quadrant percutaneous drainage catheter placement 03/20/2021. EXAM: CT IMAGE GUIDED DRAINAGE BY PERCUTANEOUS CATHETER COMPARISON:  CT abdomen and pelvis-03/30/2021; 03/19/2021; 03/11/2021 Ultrasound-guided left upper quadrant per drainage catheter placement -  03/12/2021 CT-guided right lower quadrant percutaneous drainage catheter placement - 03/20/2021 MEDICATIONS: The patient is currently admitted to the hospital and receiving intravenous antibiotics. The antibiotics were administered within an appropriate time frame prior to the initiation of the procedure. ANESTHESIA/SEDATION: Moderate (conscious) sedation was employed during this procedure. A total of Versed 4 mg and Fentanyl 100 mcg was administered intravenously. Moderate Sedation Time: 20 minutes. The patient's level of consciousness and vital signs were monitored continuously by radiology nursing throughout the procedure under my direct supervision. CONTRAST:  None COMPLICATIONS: None immediate. PROCEDURE: Informed written consent was obtained from the patient after a discussion of the risks, benefits and alternatives to treatment. The patient was placed prone on the CT gantry and a pre procedural CT was performed re-demonstrating the known abscess/fluid collection within the pelvic cul-de-sac. The procedure was planned. A timeout was performed prior to the initiation of the procedure. The left buttocks was prepped and draped in the usual sterile fashion. The overlying soft tissues were anesthetized with 1% lidocaine with epinephrine. Appropriate trajectory was planned with the use of a 22 gauge spinal needle. An 18 gauge trocar needle was advanced into the abscess/fluid collection and a short Amplatz super stiff wire was coiled within the collection. Appropriate positioning was confirmed with a limited CT scan. The tract was serially dilated allowing placement of a 10 [place] all-purpose drainage catheter. Appropriate positioning was confirmed with a limited postprocedural CT scan. Approximately 100 ml of purulent fluid was aspirated. The tube was connected to a drainage bag and sutured in place. A dressing was placed. The patient tolerated the procedure well without immediate post procedural complication.  IMPRESSION: Successful CT guided placement of a 10 French all purpose drain catheter into the pelvic cul-de-sac via left trans gluteal approach with aspiration of 100 mL of purulent fluid. Samples were sent to the laboratory as requested by the ordering clinical team. Electronically Signed   By: Sandi Mariscal M.D.   On: 04/03/2021 17:14    Labs:  CBC: Recent Labs    04/02/21 0425 04/02/21 0745 04/03/21 0319 04/05/21 0343  WBC 23.1* 22.0* 21.8* 20.7*  HGB 6.8* 6.8* 9.3* 9.9*  HCT 21.5* 21.2* 28.3* 31.3*  PLT 421* 432* 370 428*    COAGS: Recent Labs    03/20/21 0313 04/03/21 0319  INR 1.3* 1.2    BMP: Recent Labs    07/04/20 0845 07/15/20 0048 08/13/20 0906 03/28/21 0321 03/30/21 0455 03/31/21 0501 04/03/21 0319  NA 138 136   < > 136 135 134* 133*  K 3.8 3.7   < > 3.7 3.7 4.0 4.1  CL 108 106   < > 106 104 102 101  CO2 25 21*   < > 26 26 26 27   GLUCOSE 105* 100*   < > 111* 108* 113* 113*  BUN 12 21   < > 16 13 16 16   CALCIUM 9.9 9.2   < > 8.4* 8.6* 8.6* 8.8*  CREATININE 0.96 0.94   < > 0.68 0.68 0.72 0.58*  GFRNONAA >60 >60   < > >  60 >60 >60 >60  GFRAA >60 >60  --   --   --   --   --    < > = values in this interval not displayed.    LIVER FUNCTION TESTS: Recent Labs    03/24/21 0340 03/27/21 0354 03/31/21 0501 04/03/21 0319  BILITOT 0.1* 0.5 0.2* 0.4  AST 29 26 14* 19  ALT 30 29 18 22   ALKPHOS 130* 138* 99 91  PROT 6.9 6.8 6.8 6.8  ALBUMIN 2.0* 2.0* 2.0* 1.9*    Assessment and Plan:  70 y.o. male inpatient. History of rectal cancer s/p LAR with end colostomy 7.67.20. complicated by post surgical abscesses. IR placed a LUQ  (left anterior) abscess drain on 5.3.22 and RLQ abscess drain on 5.12.22. on 5.26.22. Due to persistent fluid collection despite vigorous flushing and TPA instillation.  IR placed a 3rd drain into the pelvic cut de sac via left transgluteal approach on 5.26.22.  WBC is 20.7. Cultures from pelvic cult de sac grew enterococcus faecalis  with abundant WBC present predominately PMN. Patient is afebrile. Per Epic output is:   Buttock drain -  0 ml, 67ml,  28ml   Right lateral drain - 15 ml, 16 ml, 20 ml   Lateral left abdomen - 45 ml, 5 ml, 40 ml  All drain with scant output in the gravity bags at time of evaluation. Left lateral abdominal's output is yellow in nature. Right lateral drain yellow output with some debris noted. The most recent drain the 10 Fr. Buttock has thick gray output. Do to the thick output out the buttocks drain the RN was asked to lavage the drain while flushing to help facilitate more output.  Recommend team continue with flushing TID with lavage of buttocks drain, output recording q shift and dressing changes as needed. Would consider additional imaging when output is less than 10 ml for 24 hours not including flush material.    Electronically Signed: Jacqualine Mau, NP 04/06/2021, 9:18 AM   I spent a total of 15 Minutes at the patient's bedside AND on the patient's hospital floor or unit, greater than 50% of which was counseling/coordinating care for intra abdominal and buttock abscess drains

## 2021-04-06 NOTE — Progress Notes (Addendum)
PHARMACY - TOTAL PARENTERAL NUTRITION CONSULT NOTE   Indication: Prolonged ileus  Patient Measurements: Weight: 79.4 kg (175 lb 0.7 oz)   Body mass index is 25.12 kg/m.   Assessment: 70 yo male with rectal cancer s/p Xi robotic assisted lower anterior resection with end colostomy on 4/27. Developed post-op abscess and ileus. Pharmacy consulted to initiate TPN on 5/2.  No new labs since 5/26. Recently increased Na in TPN and stopped SSI, but per RN no signs of hyperNa (thirst, AMS) or hyperglycemia (polyuria, dehydration, AMS)  Glucose / Insulin: Glucose continues to be at goal < 180 from bmet (5/26), not currently on SSI or CBG checks Electrolytes: No new labs. 5/26: All lytes WNL except Na slightly low at 133; CorrCa (10.5) Renal: SCr and BUN stable, WNL (5/26); UOP remains robust Hepatic: AST/ALT and Tbili WNLTG 100 (5/23) Albumin/prealbumin: alb 1.9, prealb 8.3 (5/23) down Intake / Output; MIVF:  - per RN, taking about 50% of offered intake (including supplements) - Drain output continues to decrease  - Colostomy output: ~200 ml/d - NS at [facility] GI Imaging: - 5/11: New right abdominopelvic loculated ascites, suspicious for developing abscess or infected ascites -5/22: Repeat Abdominal CT: pelvic fluid collection/abscess GI Surgeries / Procedures:  - 4/27:  xi robotic assisted lower anterior resection with end colostomy on 4/27 - 5/6: NGT out and unable to be replaced thus far -5/12: CT guided placed of a 12 Fr drainage catheter placement into the right lower abdomen/pelvis yielding 75 cc of purulent appearing fluid -5/26: IR placement of drain for pelvic abscess  Central access: 5/2 TPN start date: 5/2  Nutritional Goals (per RD recommendation on 5/17):   2200-2400 kcal/day, protein 90-115 g/day, and >2L/day  Goal TPN rate is 90 mL/hr (provides 112 g of protein and 2199 kcals per day)  Current Nutrition:  - TPN at 1/2 of goal rate; FLD - Boost Breeze (250kcal, 9g protein)  BID; Prosource Plus daily (100 kcal, 15g protein)  Plan:  At 18:00  Continue with half rate TPN at 83mL/hr  Provides 162 g dextrose, 56 g protein, 1100 kcal  Electrolytes in TPN: no changes from yesterday  Na 100 mEq/L  K 60 mEq/L   Ca 21mEq/L  Mg 7.2mEq/L  Phos 20 mmol/L  Ratio 1 Cl: 1 Ac   Standard MVI and trace elements ordered PO  Continue NS at [facility]; further changes per MD  TPN labs on Mon/Thurs.   [employer] A, PharmD 04/06/21 9:51 AM

## 2021-04-06 NOTE — Progress Notes (Signed)
23 Days Post-Op   Subjective/Chief Complaint: No new complaints   Objective: Vital signs in last 24 hours: Temp:  [98.1 F (36.7 C)-99.3 F (37.4 C)] 98.1 F (36.7 C) (05/29 0543) Pulse Rate:  [82-103] 99 (05/29 0543) Resp:  [17-18] 18 (05/29 0543) BP: (141-149)/(64-67) 145/64 (05/29 0543) SpO2:  [100 %] 100 % (05/29 0543) Last BM Date: 04/06/21  Intake/Output from previous day: 05/28 0701 - 05/29 0700 In: 2965 [P.O.:480; I.V.:1930; IV Piggyback:500] Out: 1907 [NOBSJ:6283; Drains:107; Stool:225] Intake/Output this shift: Total I/O In: 784 [P.O.:600; I.V.:84; IV Piggyback:100] Out: 835 [Urine:750; Drains:35; Stool:50]  General appearance: alert and cooperative Resp: clear to auscultation bilaterally Cardio: regular rate and rhythm, S1, S2 normal, no murmur, click, rub or gallop Incision/Wound: Ostomy functioning.  Wound is dressed.  Soft nontender.  PERC drains are noted left upper quadrant and right lower quadrant.  Purulent drainage noted minimal output  Lab Results:  Recent Labs    04/05/21 0343  WBC 20.7*  HGB 9.9*  HCT 31.3*  PLT 428*   BMET No results for input(s): NA, K, CL, CO2, GLUCOSE, BUN, CREATININE, CALCIUM in the last 72 hours. PT/INR No results for input(s): LABPROT, INR in the last 72 hours. ABG No results for input(s): PHART, HCO3 in the last 72 hours.  Invalid input(s): PCO2, PO2  Studies/Results: No results found.  Anti-infectives: Anti-infectives (From admission, onward)   Start     Dose/Rate Route Frequency Ordered Stop   03/28/21 1000  Ampicillin-Sulbactam (UNASYN) 3 g in sodium chloride 0.9 % 100 mL IVPB        3 g 200 mL/hr over 30 Minutes Intravenous Every 6 hours 03/28/21 0914     03/19/21 1000  piperacillin-tazobactam (ZOSYN) IVPB 3.375 g  Status:  Discontinued        3.375 g 12.5 mL/hr over 240 Minutes Intravenous Every 8 hours 03/19/21 0855 03/28/21 0914   03/13/21 1400  piperacillin-tazobactam (ZOSYN) IVPB 3.375 g         3.375 g 12.5 mL/hr over 240 Minutes Intravenous Every 8 hours 03/13/21 1259 03/16/21 2200   03/12/21 1000  anidulafungin (ERAXIS) 100 mg in sodium chloride 0.9 % 100 mL IVPB       "Followed by" Linked Group Details   100 mg 78 mL/hr over 100 Minutes Intravenous Every 24 hours 03/11/21 0941 03/16/21 1050   03/11/21 1030  anidulafungin (ERAXIS) 100 mg in sodium chloride 0.9 % 100 mL IVPB  Status:  Discontinued        100 mg 78 mL/hr over 100 Minutes Intravenous Every 24 hours 03/11/21 0937 03/11/21 0941   03/11/21 1030  anidulafungin (ERAXIS) 200 mg in sodium chloride 0.9 % 200 mL IVPB       "Followed by" Linked Group Details   200 mg 78 mL/hr over 200 Minutes Intravenous  Once 03/11/21 0941 03/11/21 1540   03/08/21 0030  piperacillin-tazobactam (ZOSYN) IVPB 3.375 g        3.375 g 12.5 mL/hr over 240 Minutes Intravenous Every 8 hours 03/08/21 0016 03/12/21 2204   03/05/21 2200  cefoTEtan (CEFOTAN) 2 g in sodium chloride 0.9 % 100 mL IVPB        2 g 200 mL/hr over 30 Minutes Intravenous Every 12 hours 03/05/21 1841 03/05/21 2258   03/05/21 0730  cefoTEtan (CEFOTAN) 2 g in sodium chloride 0.9 % 100 mL IVPB        2 g 200 mL/hr over 30 Minutes Intravenous On call to O.R. 03/05/21 6629 03/05/21 4765  Assessment/Plan: Rectal carcinoma (HCC)POD 21 LAR and diverting ostomy Post op abscess: IR drain in LLQ and RLQ, completed course of Zosyn and Eraxis. Zosyn restarted for uncontrolled abd infection on 5/11 and then switched to Unasyn per cultures.  Hemorrhagic esophagitis, seems to be resolving   Continue supportive care for now.  Wean TNA as able.  Encourage p.o. intake.  Remove Foley once JP drainage clears  LOS: 32 days    Shane [name] 04/06/2021

## 2021-04-06 NOTE — Plan of Care (Signed)
  Problem: Bowel/Gastric/Urinary: Goal: Gastrointestinal status for postoperative course will improve Outcome: Progressing   Problem: Coping: Goal: Coping ability will improve Outcome: Progressing   Problem: Nutrition: Goal: Will attain and maintain optimal nutritional status will improve Outcome: Progressing   Problem: Skin Integrity: Goal: Will show signs of wound healing Outcome: Progressing   Problem: Clinical Measurements: Goal: Will remain free from infection Outcome: Progressing

## 2021-04-07 LAB — COMPREHENSIVE METABOLIC PANEL
ALT: 22 U/L (ref 0–44)
AST: 19 U/L (ref 15–41)
Albumin: 2.1 g/dL — ABNORMAL LOW (ref 3.5–5.0)
Alkaline Phosphatase: 97 U/L (ref 38–126)
Anion gap: 6 (ref 5–15)
BUN: 14 mg/dL (ref 8–23)
CO2: 26 mmol/L (ref 22–32)
Calcium: 9 mg/dL (ref 8.9–10.3)
Chloride: 101 mmol/L (ref 98–111)
Creatinine, Ser: 0.66 mg/dL (ref 0.61–1.24)
GFR, Estimated: >60 mL/min (ref >60)
Glucose, Bld: 113 mg/dL — ABNORMAL HIGH (ref 70–99)
Potassium: 4.3 mmol/L (ref 3.5–5.1)
Sodium: 133 mmol/L — ABNORMAL LOW (ref 135–145)
Total Bilirubin: 0.2 mg/dL — ABNORMAL LOW (ref 0.3–1.2)
Total Protein: 7.2 g/dL (ref 6.5–8.1)

## 2021-04-07 LAB — CBC
HCT: 30 % — ABNORMAL LOW (ref 39.0–52.0)
Hemoglobin: 9.7 g/dL — ABNORMAL LOW (ref 13.0–17.0)
MCH: 27.7 pg (ref 26.0–34.0)
MCHC: 32.3 g/dL (ref 30.0–36.0)
MCV: 85.7 fL (ref 80.0–100.0)
Platelets: 422 10*3/uL — ABNORMAL HIGH (ref 150–400)
RBC: 3.5 MIL/uL — ABNORMAL LOW (ref 4.22–5.81)
RDW: 17.8 % — ABNORMAL HIGH (ref 11.5–15.5)
WBC: 17.1 10*3/uL — ABNORMAL HIGH (ref 4.0–10.5)
nRBC: 0 % (ref 0.0–0.2)

## 2021-04-07 LAB — MAGNESIUM: Magnesium: 2 mg/dL (ref 1.7–2.4)

## 2021-04-07 LAB — PREALBUMIN: Prealbumin: 9.3 mg/dL — ABNORMAL LOW (ref 18–38)

## 2021-04-07 LAB — PHOSPHORUS: Phosphorus: 3.3 mg/dL (ref 2.5–4.6)

## 2021-04-07 LAB — TRIGLYCERIDES: Triglycerides: 84 mg/dL (ref <150)

## 2021-04-07 MED ORDER — TRAVASOL 10 % IV SOLN
INTRAVENOUS | Status: DC
  Filled 2021-04-07: qty 561.6

## 2021-04-07 NOTE — Progress Notes (Signed)
PHARMACY - TOTAL PARENTERAL NUTRITION CONSULT NOTE   Indication: Prolonged ileus  Patient Measurements: Weight: 79.6 kg (175 lb 7.8 oz)   Body mass index is 25.18 kg/m.   Assessment: 70 yo male with rectal cancer s/p Xi robotic assisted lower anterior resection with end colostomy on 4/27. Developed post-op abscess and ileus. Pharmacy consulted to initiate TPN on 5/2.  No new labs since 5/26. Recently increased Na in TPN and stopped SSI, but per RN no signs of hyperNa (thirst, AMS) or hyperglycemia (polyuria, dehydration, AMS)  Glucose / Insulin: Glucose continues to be at goal < 180 from bmet (113), not currently on SSI or CBG checks Electrolytes: All lytes WNL except Na slightly low at 133; CorrCa slightly elevated (10.52) Renal: SCr and BUN stable, WNL; UOP remains robust Hepatic: AST/ALT and Tbili WNL, TG stable/WNL 100 (5/23), 84 (5/30) Albumin/prealbumin: alb 2.1, prealb 8.3 (5/23) 8.3 (5/30) Intake / Output; MIVF:  - taking about 50% of offered intake (including supplements) - Drain output 175 ml - Colostomy output: ~200 ml/d - NS at [facility] GI Imaging: - 5/11: New right abdominopelvic loculated ascites, suspicious for developing abscess or infected ascites -5/22: Repeat Abdominal CT: pelvic fluid collection/abscess GI Surgeries / Procedures:  - 4/27:  xi robotic assisted lower anterior resection with end colostomy on 4/27 - 5/6: NGT out and unable to be replaced thus far -5/12: CT guided placed of a 12 Fr drainage catheter placement into the right lower abdomen/pelvis yielding 75 cc of purulent appearing fluid -5/26: IR placement of drain for pelvic abscess  Central access: 5/2 TPN start date: 5/2  Nutritional Goals (per RD recommendation on 5/17):   2200-2400 kcal/day, protein 90-115 g/day, and >2L/day  Goal TPN rate is 90 mL/hr (provides 112 g of protein and 2199 kcals per day)  Current Nutrition:  - TPN at 1/2 of goal rate; FLD - Boost Breeze (250kcal, 9g protein)  BID; Prosource Plus daily (100 kcal, 15g protein)  Plan:  At 18:00  Continue with half rate TPN at 68mL/hr  Provides 162 g dextrose, 56 g protein, 1100 kcal  Electrolytes in TPN:   Na 125 mEq/L - increase  K 60 mEq/L   Ca 4 mEq/L - decrease  Mg 7.57mEq/L  Phos 20 mmol/L  Ratio 1 Cl: 1 Ac   Standard MVI and trace elements ordered PO  Continue NS at [facility]; further changes per MD  TPN labs on Mon/Thurs.   Peggyann Juba, PharmD, BCPS Pharmacy: [phone] 04/07/21 7:06 AM

## 2021-04-07 NOTE — Progress Notes (Signed)
Physical Therapy Treatment Patient Details Name: Shane [name] MRN: [id number] DOB: [birth year] Today's Date: 04/07/2021    History of Present Illness The patient is a 70 year old male who presents with colorectal cancer, in  ED 03/05/21 with complaints of hemorrhoids and rectal bleeding, anemia. CT showed a large rectal mass. S/P ROBOTIC ASSISTED LOWER ANTERIOR RESECTION WITH END COLOSTOMY  MOBILIZATION OF THE SPLENIC FLEXURE  on 03/05/21, post op tachycardia and hypotension and abscess-drains placed.    PT Comments    Pt continues to be cooperative and progressing toward goals. Pt has not been OOB today.  Agreeable to amb. Would benefit from amb more with staff in addition to working with PT    Follow Up Recommendations  Home health PT     Equipment Recommendations  None recommended by PT (has RW)    Recommendations for Other Services       Precautions / Restrictions Precautions Precautions: Fall Precaution Comments: colostomy,  5 drains Restrictions Weight Bearing Restrictions: No Other Position/Activity Restrictions: '    Mobility  Bed Mobility Overal bed mobility: Modified Independent Bed Mobility: Supine to Sit                Transfers Overall transfer level: Needs assistance Equipment used: Rolling walker (2 wheeled) Transfers: Sit to/from Stand Sit to Stand: Min guard;Supervision         General transfer comment: cues for hand placement  Ambulation/Gait Ambulation/Gait assistance: Min guard;Supervision Gait Distance (Feet): 280 Feet Assistive device: Rolling walker (2 wheeled) Gait Pattern/deviations: Step-through pattern;Decreased stride length;Steppage (appears steppage (esp on R)  but able to df bil ankles)     General Gait Details: steady, slight drifting however  no overt LOB, distance to tolerance. incr knee flexion initially d/c pain; decr DOE  (2/4)   Stairs             Wheelchair Mobility    Modified Rankin (Stroke Patients  Only)       Balance Overall balance assessment: Needs assistance Sitting-balance support: Feet supported;No upper extremity supported Sitting balance-Leahy Scale: Good       Standing balance-Leahy Scale: Fair                              Cognition Arousal/Alertness: Awake/alert Behavior During Therapy: WFL for tasks assessed/performed Overall Cognitive Status: Within Functional Limits for tasks assessed                                        Exercises General Exercises - Lower Extremity Ankle Circles/Pumps: AROM;Both;5 reps    General Comments        Pertinent Vitals/Pain Pain Assessment: Faces Faces Pain Scale: Hurts little more Pain Location: L glut and R knee Pain Descriptors / Indicators: Discomfort;Grimacing Pain Intervention(s): Limited activity within patient's tolerance;Monitored during session;Premedicated before session;Repositioned    Home Living                      Prior Function            PT Goals (current goals can now be found in the care plan section) Acute Rehab PT Goals Patient Stated Goal: to do yardwork, walk more frequently PT Goal Formulation: With patient/family Time For Goal Achievement: 04/09/21 Potential to Achieve Goals: Good Progress towards PT goals: Progressing toward goals    Frequency  Min 3X/week      PT Plan Discharge plan needs to be updated    Co-evaluation              AM-PAC PT "6 Clicks" Mobility   Outcome Measure  Help needed turning from your back to your side while in a flat bed without using bedrails?: A Little Help needed moving from lying on your back to sitting on the side of a flat bed without using bedrails?: None Help needed moving to and from a bed to a chair (including a wheelchair)?: A Little Help needed standing up from a chair using your arms (e.g., wheelchair or bedside chair)?: A Little Help needed to walk in hospital room?: A Little Help needed  climbing 3-5 steps with a railing? : A Little 6 Click Score: 19    End of Session Equipment Utilized During Treatment: Gait belt Activity Tolerance: Patient tolerated treatment well Patient left: in chair;with call bell/phone within reach   PT Visit Diagnosis: Muscle weakness (generalized) (M62.81);Difficulty in walking, not elsewhere classified (R26.2)     Time: 3361-2244 PT Time Calculation (min) (ACUTE ONLY): 23 min  Charges:  $Gait Training: 23-37 mins                     Baxter Flattery, PT  Acute Rehab Dept ([place]) [phone] Pager [phone]  04/07/2021    Kenyon Ana 04/07/2021, 4:02 PM

## 2021-04-07 NOTE — Progress Notes (Signed)
24 Days Post-Op   Subjective/Chief Complaint: Patient complaining of knee pain.  Voltaren gel applied  He is eating a little bit better.  He hates the food here.  Liquids go down well and he likes the protein shakes.   Objective: Vital signs in last 24 hours: Temp:  [98.4 F (36.9 C)-99.9 F (37.7 C)] 98.4 F (36.9 C) (05/30 0624) Pulse Rate:  [91-110] 91 (05/30 0624) Resp:  [15-18] 17 (05/30 0624) BP: (120-148)/(57-71) 120/57 (05/30 0624) SpO2:  [100 %] 100 % (05/30 0624) Weight:  [79.6 kg] 79.6 kg (05/30 0634) Last BM Date: 04/06/21  Intake/Output from previous day: 05/29 0701 - 05/30 0700 In: 2819.8 [P.O.:1320; I.V.:1209.9; IV Piggyback:229.9] Out: 41 [Urine:2910; Drains:230; Stool:225] Intake/Output this shift: Total I/O In: 320.5 [I.V.:198.9; IV Piggyback:121.6] Out: 100 [Urine:100]  General appearance: alert and cooperative Resp: clear to auscultation bilaterally Cardio: Normal sinus rhythm Incision/Wound: Ostomy functioning well viable ostomy.  Incisions healing well mild distention to abdomen without rebound or guarding 2 drains in place with yellow murky fluid.  Culture showed Enterococcus  Lab Results:  Recent Labs    04/05/21 0343 04/07/21 0330  WBC 20.7* 17.1*  HGB 9.9* 9.7*  HCT 31.3* 30.0*  PLT 428* 422*   BMET Recent Labs    04/07/21 0330  NA 133*  K 4.3  CL 101  CO2 26  GLUCOSE 113*  BUN 14  CREATININE 0.66  CALCIUM 9.0   PT/INR No results for input(s): LABPROT, INR in the last 72 hours. ABG No results for input(s): PHART, HCO3 in the last 72 hours.  Invalid input(s): PCO2, PO2  Studies/Results: No results found.  Anti-infectives: Anti-infectives (From admission, onward)   Start     Dose/Rate Route Frequency Ordered Stop   03/28/21 1000  Ampicillin-Sulbactam (UNASYN) 3 g in sodium chloride 0.9 % 100 mL IVPB        3 g 200 mL/hr over 30 Minutes Intravenous Every 6 hours 03/28/21 0914     03/19/21 1000  piperacillin-tazobactam  (ZOSYN) IVPB 3.375 g  Status:  Discontinued        3.375 g 12.5 mL/hr over 240 Minutes Intravenous Every 8 hours 03/19/21 0855 03/28/21 0914   03/13/21 1400  piperacillin-tazobactam (ZOSYN) IVPB 3.375 g        3.375 g 12.5 mL/hr over 240 Minutes Intravenous Every 8 hours 03/13/21 1259 03/16/21 2200   03/12/21 1000  anidulafungin (ERAXIS) 100 mg in sodium chloride 0.9 % 100 mL IVPB       "Followed by" Linked Group Details   100 mg 78 mL/hr over 100 Minutes Intravenous Every 24 hours 03/11/21 0941 03/16/21 1050   03/11/21 1030  anidulafungin (ERAXIS) 100 mg in sodium chloride 0.9 % 100 mL IVPB  Status:  Discontinued        100 mg 78 mL/hr over 100 Minutes Intravenous Every 24 hours 03/11/21 0937 03/11/21 0941   03/11/21 1030  anidulafungin (ERAXIS) 200 mg in sodium chloride 0.9 % 200 mL IVPB       "Followed by" Linked Group Details   200 mg 78 mL/hr over 200 Minutes Intravenous  Once 03/11/21 0941 03/11/21 1540   03/08/21 0030  piperacillin-tazobactam (ZOSYN) IVPB 3.375 g        3.375 g 12.5 mL/hr over 240 Minutes Intravenous Every 8 hours 03/08/21 0016 03/12/21 2204   03/05/21 2200  cefoTEtan (CEFOTAN) 2 g in sodium chloride 0.9 % 100 mL IVPB        2 g 200 mL/hr over  30 Minutes Intravenous Every 12 hours 03/05/21 1841 03/05/21 2258   03/05/21 0730  cefoTEtan (CEFOTAN) 2 g in sodium chloride 0.9 % 100 mL IVPB        2 g 200 mL/hr over 30 Minutes Intravenous On call to O.R. 03/05/21 0729 03/05/21 0926      Assessment/Plan: Patient Active Problem List   Diagnosis Date Noted  . Pressure injury of skin 03/25/2021  . Colostomy in place [facility]) 03/19/2021  . Hyperglycemia 03/14/2021  . Hypernatremia 03/13/2021  . Acute upper GI bleeding 03/13/2021  . Enterococcal sepsis ([place]) 03/13/2021  . Hypokalemia 03/12/2021  . Malnutrition of moderate degree 03/11/2021  . Anemia   . Hypotension   . Tachypnea 03/09/2021  . Positive D dimer 03/09/2021  . Acute respiratory distress   . Ileus  ([place])   . AKI (acute kidney injury) ([place]) 03/08/2021  . Hemorrhoids, internal 03/08/2021  . Hypercholesteremia 03/08/2021  . Pulmonary nodules 03/08/2021  . Hypoxia 03/08/2021  . Phimosis of penis s/p dilitation 03/05/2021 03/08/2021  . Delirium 03/07/2021  . Port-A-Cath in place 12/10/2020  . Rectal carcinoma ([place]) 07/18/2020  . Diarrhea 07/09/2020  . Iron deficiency anemia 07/04/2020  . Goals of care, counseling/discussion 07/04/2020  . S/P carpal tunnel release 01/30/2020  . Right carpal tunnel syndrome 09/19/2018  . 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 Abnormality of gait 02/23/2018  . Degenerative cervical disc 03/24/2016  . 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 Routine general medical examination at a health care facility 09/02/2015  . Numbness and tingling in hands 08/27/2015    P.o. intake is better over the last 24 hours.  Hopefully begin to wean TNA tomorrow.  Otherwise continue with therapies.  Drains that are in place are working well with minimal scant output.  Ostomy functioning well.  He seems more alert today than he has over the weekend.  Continue antibiotics for Enterococcus.  Sensitive to Unasyn he is on this.   LOS: 33 days    Turner Daniels MD  04/07/2021

## 2021-04-08 LAB — COMPREHENSIVE METABOLIC PANEL
ALT: 22 U/L (ref 0–44)
AST: 19 U/L (ref 15–41)
Albumin: 2 g/dL — ABNORMAL LOW (ref 3.5–5.0)
Alkaline Phosphatase: 97 U/L (ref 38–126)
Anion gap: 5 (ref 5–15)
BUN: 14 mg/dL (ref 8–23)
CO2: 28 mmol/L (ref 22–32)
Calcium: 9 mg/dL (ref 8.9–10.3)
Chloride: 99 mmol/L (ref 98–111)
Creatinine, Ser: 0.66 mg/dL (ref 0.61–1.24)
GFR, Estimated: >60 mL/min (ref >60)
Glucose, Bld: 104 mg/dL — ABNORMAL HIGH (ref 70–99)
Potassium: 4.2 mmol/L (ref 3.5–5.1)
Sodium: 132 mmol/L — ABNORMAL LOW (ref 135–145)
Total Bilirubin: 0.2 mg/dL — ABNORMAL LOW (ref 0.3–1.2)
Total Protein: 7.2 g/dL (ref 6.5–8.1)

## 2021-04-08 MED ORDER — PROSOURCE PLUS PO LIQD
30.0000 mL | Freq: Two times a day (BID) | ORAL | Status: DC
  Administered 2021-04-08 – 2021-04-11 (×6): 30 mL via ORAL
  Filled 2021-04-08 (×6): qty 30

## 2021-04-08 MED ORDER — BOOST / RESOURCE BREEZE PO LIQD CUSTOM
1.0000 | Freq: Three times a day (TID) | ORAL | Status: DC
  Administered 2021-04-08 – 2021-04-11 (×9): 1 via ORAL

## 2021-04-08 MED ORDER — TRAVASOL 10 % IV SOLN: INTRAVENOUS | Status: AC

## 2021-04-08 NOTE — Progress Notes (Signed)
Patient had incontinent episode of urine after foley removal. Patient bladder scanned after voiding and showed 313ml. MD notified.

## 2021-04-08 NOTE — Progress Notes (Signed)
PHARMACY - TOTAL PARENTERAL NUTRITION CONSULT NOTE   Indication: Prolonged ileus  Patient Measurements: Weight: 79.6 kg (175 lb 7.8 oz)   Body mass index is 25.18 kg/m.   Assessment: 70 yo male with rectal cancer s/p Xi robotic assisted lower anterior resection with end colostomy on 4/27. Developed post-op abscess and ileus. Pharmacy consulted to initiate TPN on 5/2.  Glucose / Insulin: Glucose continues to be at goal < 180 from bmet (104), not currently on SSI or CBG checks Electrolytes: All lytes WNL except Na slightly low at 132; CorrCa slightly elevated (10.6) Renal: SCr and BUN stable, WNL Hepatic: AST/ALT and Tbili WNL, TG stable/WNL 100 (5/23), 84 (5/30) Albumin/prealbumin: alb 2, prealb 8.3 (5/23) 9.3 (5/30) Intake / Output; MIVF:  - Drain output 70 mL - Colostomy output: none charted yesterday - NS at [facility] GI Imaging: - 5/11: New right abdominopelvic loculated ascites, suspicious for developing abscess or infected ascites -5/22: Repeat Abdominal CT: pelvic fluid collection/abscess GI Surgeries / Procedures:  - 4/27:  xi robotic assisted lower anterior resection with end colostomy on 4/27 - 5/6: NGT out and unable to be replaced thus far -5/12: CT guided placed of a 12 Fr drainage catheter placement into the right lower abdomen/pelvis yielding 75 cc of purulent appearing fluid -5/26: IR placement of drain for pelvic abscess  Central access: 5/2 TPN start date: 5/2  Nutritional Goals (per RD recommendation on 5/17):   2200-2400 kcal/day, protein 90-115 g/day, and >2L/day  Goal TPN rate is 90 mL/hr (provides 112 g of protein and 2199 kcals per day)  Current Nutrition:  - TPN at 1/2 of goal rate; full liquid diet - Boost Breeze (250kcal, 9g protein) TID; Prosource Plus daily (100 kcal, 15g protein)  Oral intake increasing  Plan:   Discussed with CCS, will wean off of TPN today.   Reduce TPN to 1/2 of current rate at 1600 today and let run until 1800 this evening.    Discontinue all TPN associated labs and nursing orders. Pharmacy to sign off.   Lenis Noon, PharmD 04/08/21 10:53 AM

## 2021-04-08 NOTE — Progress Notes (Signed)
Referring Physician(s): Gross,S  Supervising Physician: Daryll Brod  Patient Status:  [facility] - In-pt  Chief Complaint: Abdominal/buttocks pain/ abscesses   Subjective: Patient doing fairly well today; denies worsening abdominal pain, nausea, vomiting   Allergies: Nsaids  Medications: Prior to Admission medications   Medication Sig Start Date End Date Taking? Authorizing Provider  Diclofenac Sodium 2 % SOLN Place 2 g onto the skin 2 (two) times daily. Patient taking differently: Place 2 g onto the skin daily as needed (pain). 06/30/19  Yes Lyndal Pulley, DO  docusate sodium (COLACE) 100 MG capsule Take 100 mg by mouth daily as needed for mild constipation.    Yes [provider]  HYDROcodone-acetaminophen (NORCO/VICODIN) 5-325 MG tablet Take 1 tablet by mouth every 6 (six) hours as needed for moderate pain. 02/12/21  Yes Owens Shark, NP  hydrocortisone (ANUSOL-HC) 2.5 % rectal cream Place 1 application rectally 2 (two) times daily. Patient taking differently: Place 1 application rectally daily as needed for hemorrhoids or anal itching. 01/28/21  Yes Marrian Salvage, FNP  lidocaine-prilocaine (EMLA) cream Apply 1 application topically as needed. Patient taking differently: Apply 1 application topically as needed (prior to port use). 10/21/20  Yes Ladell Pier, MD  pravastatin (PRAVACHOL) 20 MG tablet TAKE 1 TABLET(20 MG) BY MOUTH DAILY Patient taking differently: Take 20 mg by mouth daily. 11/12/20  Yes Marrian Salvage, FNP  capecitabine (XELODA) 500 MG tablet TAKE 2000 MG (4 TABLETS) EVERY MORNING AND 1500 MG (3 TABLETS) EVERY EVENING FOR A TOTAL DAILY DOSE OF 3500 MG ON RADIATION DAYS ONLY. Patient not taking: No sig reported 11/04/20 11/04/21  Ladell Pier, MD  diphenoxylate-atropine (LOMOTIL) 2.5-0.025 MG tablet Take 1-2 tablets by mouth 3 (three) times daily as needed for diarrhea or loose stools (Maxiumum of 6/day). Patient not taking: No sig  reported 12/25/20   Ladell Pier, MD  ondansetron (ZOFRAN) 8 MG tablet Take 1 tablet (8 mg total) by mouth every 8 (eight) hours as needed for nausea or vomiting. Start 72 hours after each IV chemotherapy treatment 07/30/20   Ladell Pier, MD  prochlorperazine (COMPAZINE) 10 MG tablet Take 1 tablet (10 mg total) by mouth every 6 (six) hours as needed for nausea. 07/30/20   Ladell Pier, MD     Vital Signs: BP (!) 135/58 (BP Location: Left Arm)   Pulse 91   Temp 97.6 F (36.4 C) (Oral)   Resp 17   Wt 175 lb 7.8 oz (79.6 kg)   SpO2 99%   BMI 25.18 kg/m   Physical Exam awake, alert.  Right/left/transgluteal abdominal /pelvic drains intact, minimal output from IR drains, 40 cc output from surgical drain yesterday  Imaging: No results found.  Labs:  CBC: Recent Labs    04/02/21 0745 04/03/21 0319 04/05/21 0343 04/07/21 0330  WBC 22.0* 21.8* 20.7* 17.1*  HGB 6.8* 9.3* 9.9* 9.7*  HCT 21.2* 28.3* 31.3* 30.0*  PLT 432* 370 428* 422*    COAGS: Recent Labs    03/20/21 0313 04/03/21 0319  INR 1.3* 1.2    BMP: Recent Labs    07/04/20 0845 07/15/20 0048 08/13/20 0906 03/31/21 0501 04/03/21 0319 04/07/21 0330 04/08/21 0446  NA 138 136   < > 134* 133* 133* 132*  K 3.8 3.7   < > 4.0 4.1 4.3 4.2  CL 108 106   < > 102 101 101 99  CO2 25 21*   < > 26 27 26  28  GLUCOSE 105* 100*   < > 113* 113* 113* 104*  BUN 12 21   < > 16 16 14 14   CALCIUM 9.9 9.2   < > 8.6* 8.8* 9.0 9.0  CREATININE 0.96 0.94   < > 0.72 0.58* 0.66 0.66  GFRNONAA >60 >60   < > >60 >60 >60 >60  GFRAA >60 >60  --   --   --   --   --    < > = values in this interval not displayed.    LIVER FUNCTION TESTS: Recent Labs    03/31/21 0501 04/03/21 0319 04/07/21 0330 04/08/21 0446  BILITOT 0.2* 0.4 0.2* 0.2*  AST 14* 19 19 19   ALT 18 22 22 22   ALKPHOS 99 91 97 97  PROT 6.8 6.8 7.2 7.2  ALBUMIN 2.0* 1.9* 2.1* 2.0*    Assessment and Plan: Patient with history of rectal cancer, status post  LAR with end colostomy;subsequently developedpostop left upper quadrant abdabscess, s/pdrain placementon 5/3; statuspost drainage of right lower abdominal quadrant/pelvic abscess on 5/12; status post drainage of pelvic cul-de-sac abscess via left transgluteal approach on 5/26; afebrile, creat nl;  continue current treatment/plans as per CCS; once drain outputs are minimal over at least 2 to 3-day span could consider follow-up imaging   Electronically Signed: D. Rowe Robert, PA-C 04/08/2021, 11:49 AM   I spent a total of 15 minutes at the the patient's bedside AND on the patient's hospital floor or unit, greater than 50% of which was counseling/coordinating care for abdominal/pelvic abscess drains    [name], male   DOB: [birth year], 70 y.o.   MRN: [id number]

## 2021-04-08 NOTE — Progress Notes (Signed)
Rectal carcinoma ([place])  Subjective: Did well this weekend.  Feels he is able to eat more and more  Objective: Vital signs in last 24 hours: Temp:  [97.6 F (36.4 C)-98.4 F (36.9 C)] 97.6 F (36.4 C) (05/31 0541) Pulse Rate:  [90-95] 91 (05/31 0541) Resp:  [16-17] 17 (05/31 0541) BP: (135-170)/(58-67) 135/58 (05/31 0541) SpO2:  [97 %-100 %] 99 % (05/31 0541) Last BM Date: 04/07/21  Intake/Output from previous day: 05/30 0701 - 05/31 0700 In: 2957.2 [P.O.:780; I.V.:1574.7; IV Piggyback:412.5] Out: 1595 [Urine:1525; Drains:70] Intake/Output this shift: Total I/O In: 299 [I.V.:152.9; Other:40; IV Piggyback:106.2] Out: -   General appearance: alert and cooperative GI: normal findings: soft, no TTP, no peritonitis, less distention Incision/Wound: clean, dry, intact JP: min cloudy SS fluid Ostomy: stool in bag LLQ drain: min purulent output RLQ drain: min cloudy SS fluid TG drain: min cloudy SS fluid Lab Results:  Results for orders placed or performed during the hospital encounter of 03/05/21 (from the past 24 hour(s))  Comprehensive metabolic panel     Status: Abnormal   Collection Time: 04/08/21  4:46 AM  Result Value Ref Range   Sodium 132 (L) 135 - 145 mmol/L   Potassium 4.2 3.5 - 5.1 mmol/L   Chloride 99 98 - 111 mmol/L   CO2 28 22 - 32 mmol/L   Glucose, Bld 104 (H) 70 - 99 mg/dL   BUN 14 8 - 23 mg/dL   Creatinine, Ser 0.66 0.61 - 1.24 mg/dL   Calcium 9.0 8.9 - 10.3 mg/dL   Total Protein 7.2 6.5 - 8.1 g/dL   Albumin 2.0 (L) 3.5 - 5.0 g/dL   AST 19 15 - 41 U/L   ALT 22 0 - 44 U/L   Alkaline Phosphatase 97 38 - 126 U/L   Total Bilirubin 0.2 (L) 0.3 - 1.2 mg/dL   GFR, Estimated >60 >60 mL/min   Anion gap 5 5 - 15     Studies/Results Radiology     MEDS, Scheduled . (feeding supplement) PROSource Plus  30 mL Oral Daily  . sodium chloride   Intravenous Once  . acetaminophen  1,000 mg Oral Q6H  . bacitracin   Topical BID  . chlorhexidine  15 mL Mouth  Rinse BID  . Chlorhexidine Gluconate Cloth  6 each Topical Daily  . feeding supplement  1 Container Oral BID BM  . lip balm  1 application Topical BID  . mouth rinse  15 mL Mouth Rinse q12n4p  . melatonin  3 mg Oral QHS  . multivitamin with minerals  1 tablet Oral Daily  . pantoprazole  40 mg Oral BID AC  . sodium chloride flush  10-40 mL Intracatheter Q12H  . sodium chloride flush  5 mL Intracatheter Q8H  . sodium chloride flush  5 mL Intracatheter Q8H  . sodium chloride flush  5 mL Intracatheter Q8H     Assessment: Rectal carcinoma (HCC)  S/p LAR and end ostomy Post op abscess: IR drain in LLQ and RLQ, completed course of Zosyn and Eraxis.  Zosyn restarted for uncontrolled abd infection on 5/11 and then switched to Unasyn per cultures.   Hemorrhagic esophagitis, seems to be resolving  Plan: Cont 1/2 TPN, pt not eating much of his soft diet, nutritional shakes between meals Acute blood loss anemia: Hgb decreased yesterday, 2 units given, has had IV Fe and several transfusions ID:   Unasyn per culture sensitivities. Cont IR drains. CT shows pelvic fluid collection that sits around pelvic drain.  Will stop flushing pelvic JP RT for pulm toilet, no signs for pulmonary infection PT for ambulation IV and PO pain meds, attempt to wean IV dilaudid  cont PO PPI for esophagitis, will stay with fulls for now   Hopefully Pt can tolerate enough PO to stop TPN.   will d/c foley and have a voiding trial today.  Bladder scan after voiding to check PVR   LOS: 34 days    Rosario Adie, MD  [facility], [place]    04/08/2021 9:43 AM

## 2021-04-08 NOTE — Progress Notes (Signed)
Nutrition Follow-up  DOCUMENTATION CODES:   Non-severe (moderate) malnutrition in context of chronic illness  INTERVENTION:   -TPN management per Pharmacy   -Boost Breeze po TID, each supplement provides 250 kcal and 9 grams of protein  -Prosource Plus PO BID, each provides 100 kcals and 15g protein  -Multivitamin with minerals daily  NUTRITION DIAGNOSIS:   Moderate Malnutrition related to chronic illness,cancer and cancer related treatments as evidenced by mild fat depletion,moderate fat depletion,mild muscle depletion,moderate muscle depletion,severe muscle depletion.  Ongoing  GOAL:   Patient will meet greater than or equal to 90% of their needs  Progressing.  MONITOR:   PO intake,Supplement acceptance,Diet advancement,Labs,Weight trends,Other (Comment) (TPN regimen)  ASSESSMENT:   70 yo male with a PMH of arthritis and recent dx (08/2020) of rectal cancer (current treatment of [facility]) who presents with rectal bleeding. Pt with AKI and ileus.  Significant Events: 4/27- anterior resection with end colostomy;diet advanced to CLD 4/28- NPO 4/30- advanced toCLDthen back to NPO 5/1- NGT placed 5/2-double lumen PICC placed in R basilic; TPN initiation 5/4- NGT removed 5/9- diet advanced to CLD 5/10- diet advanced to FLD 5/11- diet downgraded to NPO 5/14- diet advanced to CLD 5/17 -FLD 5/19- Soft diet  Patient continues TPN at half rate of 45 ml/hr (providing 1099 kcals, 56g protein).  Patient reporting not liking the hospital food provided. Is compliant with protein drinks and supplements, will increase as may wean off TPN soon. 3 Boost Breeze + 2 Prosource Plus provides 950 kcals and 57g protein.  Admission weight: 183 lbs. Current weight: 175 lbs.  Medications: Multivitamin with minerals daily  Labs reviewed: Low Na  Diet Order:   Diet Order            Diet full liquid Room service appropriate? Yes; Fluid consistency: Thin  Diet effective now                  EDUCATION NEEDS:   Education needs have been addressed  Skin:  Skin Assessment: Skin Integrity Issues: Skin Integrity Issues:: Stage II,Other (Comment) Stage II: sacrum (newly documented 5/13) Incisions: Abdomen, closed Other: MASD (newly documented 5/17)  Last BM:  5/21 -type 7  Height:   Ht Readings from Last 1 Encounters:  02/26/21 5\' 10"  (1.778 m)    Weight:   Wt Readings from Last 1 Encounters:  04/07/21 79.6 kg    BMI:  Body mass index is 25.18 kg/m.  Estimated Nutritional Needs:   Kcal:  2200-2400  Protein:  90-115 grams  Fluid:  >2 L  Clayton Bibles, MS, RD, LDN Inpatient Clinical Dietitian Contact information available via Amion

## 2021-04-09 LAB — CBC
HCT: 29.5 % — ABNORMAL LOW (ref 39.0–52.0)
Hemoglobin: 9.5 g/dL — ABNORMAL LOW (ref 13.0–17.0)
MCH: 27.2 pg (ref 26.0–34.0)
MCHC: 32.2 g/dL (ref 30.0–36.0)
MCV: 84.5 fL (ref 80.0–100.0)
Platelets: 441 K/uL — ABNORMAL HIGH (ref 150–400)
RBC: 3.49 MIL/uL — ABNORMAL LOW (ref 4.22–5.81)
RDW: 18 % — ABNORMAL HIGH (ref 11.5–15.5)
WBC: 14.7 K/uL — ABNORMAL HIGH (ref 4.0–10.5)
nRBC: 0 % (ref 0.0–0.2)

## 2021-04-09 NOTE — Consult Note (Signed)
[place] Nurse ostomy follow up Stoma type/location: LLQ, end colostomy Stomal assessment/size: 1" x 1 1/4" slightly oval shaped, os at 1 o'clock, pink moist  Peristomal assessment:  Mucocutaneous separation has improved, only tiny area noted at 3 o'clock  Treatment options for stomal/peristomal skin:  Using 2" skin barrier ring Output liquid, dark brown, ? Slightly bloody Ostomy pouching: 1pc flat with 2" skin barrier Education provided:  Patient more engaged to observe pouch change Reviewed all steps of pouch change with patient, no family in the room.  Patient asking very appropriate questions about barrier ring, pattern and sizing, supplies. Would benefit from [facility] due to his lack of ability to learn due to severity of condition.  Enrolled patient in [place] Discharge program: Yes, previously   6 1pc flat and 6 barrier rings in the patient's room Will plan to follow up for teaching next week if still inpatient.   [place] Nurse will follow along with you for continued support with ostomy teaching and care [place] MSN, RN, [place], [place], [place]

## 2021-04-09 NOTE — Progress Notes (Signed)
Referring Physician(s): Gross,S  Supervising Physician: Ruthann Cancer  Patient Status:  [facility] - In-pt  Chief Complaint:  Abdominal/buttocks pain/ abscesses  Subjective: Patient denies worsening abdominal pain, nausea, vomiting   Allergies: Nsaids  Medications: Prior to Admission medications   Medication Sig Start Date End Date Taking? Authorizing Provider  Diclofenac Sodium 2 % SOLN Place 2 g onto the skin 2 (two) times daily. Patient taking differently: Place 2 g onto the skin daily as needed (pain). 06/30/19  Yes Lyndal Pulley, DO  docusate sodium (COLACE) 100 MG capsule Take 100 mg by mouth daily as needed for mild constipation.    Yes [provider]  HYDROcodone-acetaminophen (NORCO/VICODIN) 5-325 MG tablet Take 1 tablet by mouth every 6 (six) hours as needed for moderate pain. 02/12/21  Yes Owens Shark, NP  hydrocortisone (ANUSOL-HC) 2.5 % rectal cream Place 1 application rectally 2 (two) times daily. Patient taking differently: Place 1 application rectally daily as needed for hemorrhoids or anal itching. 01/28/21  Yes Marrian Salvage, FNP  lidocaine-prilocaine (EMLA) cream Apply 1 application topically as needed. Patient taking differently: Apply 1 application topically as needed (prior to port use). 10/21/20  Yes Ladell Pier, MD  pravastatin (PRAVACHOL) 20 MG tablet TAKE 1 TABLET(20 MG) BY MOUTH DAILY Patient taking differently: Take 20 mg by mouth daily. 11/12/20  Yes Marrian Salvage, FNP  capecitabine (XELODA) 500 MG tablet TAKE 2000 MG (4 TABLETS) EVERY MORNING AND 1500 MG (3 TABLETS) EVERY EVENING FOR A TOTAL DAILY DOSE OF 3500 MG ON RADIATION DAYS ONLY. Patient not taking: No sig reported 11/04/20 11/04/21  Ladell Pier, MD  diphenoxylate-atropine (LOMOTIL) 2.5-0.025 MG tablet Take 1-2 tablets by mouth 3 (three) times daily as needed for diarrhea or loose stools ([place] of 6/day). Patient not taking: No sig reported 12/25/20    Ladell Pier, MD  ondansetron (ZOFRAN) 8 MG tablet Take 1 tablet (8 mg total) by mouth every 8 (eight) hours as needed for nausea or vomiting. Start 72 hours after each IV chemotherapy treatment 07/30/20   Ladell Pier, MD  prochlorperazine (COMPAZINE) 10 MG tablet Take 1 tablet (10 mg total) by mouth every 6 (six) hours as needed for nausea. 07/30/20   Ladell Pier, MD     Vital Signs: BP (!) 104/91 (BP Location: Left Arm)   Pulse 90   Temp 99 F (37.2 C)   Resp 16   Wt 175 lb 7.8 oz (79.6 kg)   SpO2 100%   BMI 25.18 kg/m   Physical Exam awake, alert.  Right/left/transgluteal abdominal /pelvic drains intact, output from transgluteal drain 25 cc turbid, yellow fluid, right lower quadrant drain with minimal output, left lower quadrant drain with 25 cc yellow fluid, surgical drain with 25 cc yellow fluid.  All IR drains irrigated without difficulty.  Imaging: No results found.  Labs:  CBC: Recent Labs    04/03/21 0319 04/05/21 0343 04/07/21 0330 04/09/21 0304  WBC 21.8* 20.7* 17.1* 14.7*  HGB 9.3* 9.9* 9.7* 9.5*  HCT 28.3* 31.3* 30.0* 29.5*  PLT 370 428* 422* 441*    COAGS: Recent Labs    03/20/21 0313 04/03/21 0319  INR 1.3* 1.2    BMP: Recent Labs    07/04/20 0845 07/15/20 0048 08/13/20 0906 03/31/21 0501 04/03/21 0319 04/07/21 0330 04/08/21 0446  NA 138 136   < > 134* 133* 133* 132*  K 3.8 3.7   < > 4.0 4.1 4.3 4.2  CL 108  106   < > 102 101 101 99  CO2 25 21*   < > 26 27 26 28   GLUCOSE 105* 100*   < > 113* 113* 113* 104*  BUN 12 21   < > 16 16 14 14   CALCIUM 9.9 9.2   < > 8.6* 8.8* 9.0 9.0  CREATININE 0.96 0.94   < > 0.72 0.58* 0.66 0.66  GFRNONAA >60 >60   < > >60 >60 >60 >60  GFRAA >60 >60  --   --   --   --   --    < > = values in this interval not displayed.    LIVER FUNCTION TESTS: Recent Labs    03/31/21 0501 04/03/21 0319 04/07/21 0330 04/08/21 0446  BILITOT 0.2* 0.4 0.2* 0.2*  AST 14* 19 19 19   ALT 18 22 22 22   ALKPHOS  99 91 97 97  PROT 6.8 6.8 7.2 7.2  ALBUMIN 2.0* 1.9* 2.1* 2.0*    Assessment and Plan: Patient with history of rectal cancer, status post LAR with end colostomy;subsequently developedpostop left upper quadrant abdabscess, s/pdrain placementon 5/3; statuspost drainage of right lower abdominal quadrant/pelvic abscess on 5/12;status post drainage of pelvic cul-de-sac abscess via left transgluteal approach on 5/26; temp 99, WBC 14.7 down from 17.1, hemoglobin 9.5 down from 9.7; continue current treatment/plans as per CCS;once drain outputs are minimal over at least 2 to 3-day span could consider follow-up imaging   Electronically Signed: D. Rowe Robert, PA-C 04/09/2021, 9:53 AM   I spent a total of 15 minutes at the the patient's bedside AND on the patient's hospital floor or unit, greater than 50% of which was counseling/coordinating care for abdominal/pelvic abscess drains    [name], male   DOB: 10/04/1951, 70 y.o.   MRN: [id number]

## 2021-04-09 NOTE — Progress Notes (Signed)
Rectal carcinoma ([place])  Subjective: Urinating better with foley out.  Drinking some liquids  Objective: Vital signs in last 24 hours: Temp:  [98 F (36.7 C)-99 F (37.2 C)] 99 F (37.2 C) (06/01 0457) Pulse Rate:  [90-94] 90 (06/01 0457) Resp:  [16-17] 16 (06/01 0457) BP: (104-149)/(57-91) 104/91 (06/01 0457) SpO2:  [98 %-100 %] 100 % (06/01 0457) Last BM Date: 04/09/21  Intake/Output from previous day: 05/31 0701 - 06/01 0700 In: 3064.3 [P.O.:830; I.V.:958.2; IV Piggyback:1236.2] Out: 675 [Urine:600; Drains:75] Intake/Output this shift: Total I/O In: -  Out: 100 [Stool:100]  General appearance: alert and cooperative GI: normal findings: soft, no TTP, no peritonitis, less distention Incision/Wound: clean, dry, intact JP: min cloudy SS fluid Ostomy: stool in bag LLQ drain: min purulent output RLQ drain: min cloudy SS fluid TG drain: min purulent fluid Lab Results:  Results for orders placed or performed during the hospital encounter of 03/05/21 (from the past 24 hour(s))  CBC     Status: Abnormal   Collection Time: 04/09/21  3:04 AM  Result Value Ref Range   WBC 14.7 (H) 4.0 - 10.5 K/uL   RBC 3.49 (L) 4.22 - 5.81 MIL/uL   Hemoglobin 9.5 (L) 13.0 - 17.0 g/dL   HCT 29.5 (L) 39.0 - 52.0 %   MCV 84.5 80.0 - 100.0 fL   MCH 27.2 26.0 - 34.0 pg   MCHC 32.2 30.0 - 36.0 g/dL   RDW 18.0 (H) 11.5 - 15.5 %   Platelets 441 (H) 150 - 400 K/uL   nRBC 0.0 0.0 - 0.2 %     Studies/Results Radiology     MEDS, Scheduled . (feeding supplement) PROSource Plus  30 mL Oral BID BM  . sodium chloride   Intravenous Once  . acetaminophen  1,000 mg Oral Q6H  . bacitracin   Topical BID  . chlorhexidine  15 mL Mouth Rinse BID  . Chlorhexidine Gluconate Cloth  6 each Topical Daily  . feeding supplement  1 Container Oral TID BM  . lip balm  1 application Topical BID  . mouth rinse  15 mL Mouth Rinse q12n4p  . melatonin  3 mg Oral QHS  . multivitamin with minerals  1 tablet Oral  Daily  . pantoprazole  40 mg Oral BID AC  . sodium chloride flush  5 mL Intracatheter Q8H     Assessment: Rectal carcinoma ([place])  S/p LAR and end ostomy Post op abscess: IR drain in LLQ and RLQ, completed course of Zosyn and Eraxis.  Zosyn restarted for uncontrolled abd infection on 5/11 and then switched to Unasyn per cultures.   Hemorrhagic esophagitis, seems to be resolving  Plan: Moderate Malnutrition related to chronic illness,cancer and cancer related treatments as evidenced by mild fat depletion,moderate fat depletion,mild muscle depletion,moderate muscle depletion,severe muscle depletion.  TPN off, eating some of the full liquids, nutritional shakes between meals, encouraged more PO intake today Acute blood loss anemia due to esophagitis: Hgb stable has had IV Fe and several transfusions ID:   Unasyn per culture sensitivities. Cont IR drains. May be able to get some out in the next few days RT for pulm toilet, no signs for pulmonary infection PT for ambulation  PO pain meds, cont to wean IV dilaudid  cont PO PPI for esophagitis, will stay with fulls for now.  Will need GI f/u as an outpt  Making continued progress  Will cont to monitor voiding.  Plan to recheck BMET and CBC on Fri  LOS: 35 days    Rosario Adie, MD  [facility], [place]    04/09/2021 11:10 AM

## 2021-04-09 NOTE — Progress Notes (Signed)
Physical Therapy Treatment Patient Details Name: Shane [name] MRN: [id number] DOB: Aug 04, 1951 Today's Date: 04/09/2021    History of Present Illness The patient is a 70 year old male who presents with colorectal cancer, in  ED 03/05/21 with complaints of hemorrhoids and rectal bleeding, anemia. CT showed a large rectal mass. S/P ROBOTIC ASSISTED LOWER ANTERIOR RESECTION WITH END COLOSTOMY  MOBILIZATION OF THE SPLENIC FLEXURE  on 03/05/21, post op tachycardia and hypotension and abscess-drains placed.    PT Comments    Patient is making good progress with acute PT and is motivated to progress gait distance and activity. Patient ambulated ~360' with min guard for safety. He maintained safe position to RW and demonstrated increased hip flexion to compensate for decreased ankle dorsiflexion and allow safe toe clearance. Patient instructed on functional sit<>stands for LE strengthening and completed 1 rep but was limited by fatigue. He will continue to benefit from skilled PT interventions to address impairments and progress mobility as able.   Follow Up Recommendations  Home health PT     Equipment Recommendations  None recommended by PT    Recommendations for Other Services       Precautions / Restrictions Precautions Precautions: Fall Precaution Comments: colostomy,  5 drains Restrictions Weight Bearing Restrictions: No    Mobility  Bed Mobility Overal bed mobility: Modified Independent Bed Mobility: Supine to Sit     Supine to sit: Modified independent (Device/Increase time)     General bed mobility comments: incr time, no physical assist    Transfers Overall transfer level: Needs assistance Equipment used: Rolling walker (2 wheeled) Transfers: Sit to/from Stand Sit to Stand: Supervision            Ambulation/Gait Ambulation/Gait assistance: Min guard;Supervision Gait Distance (Feet): 360 Feet Assistive device: Rolling walker (2 wheeled) Gait Pattern/deviations:  Step-through pattern;Decreased stride length;Shuffle (decreased bil ankle dorsiflexion) Gait velocity: WFL   General Gait Details: patient   Chief Strategy Officer    Modified Rankin (Stroke Patients Only)       Balance Overall balance assessment: Needs assistance Sitting-balance support: Feet supported;No upper extremity supported Sitting balance-Leahy Scale: Good     Standing balance support: Bilateral upper extremity supported Standing balance-Leahy Scale: Fair                              Cognition Arousal/Alertness: Awake/alert Behavior During Therapy: WFL for tasks assessed/performed Overall Cognitive Status: Within Functional Limits for tasks assessed                                        Exercises Other Exercises Other Exercises: Reviewed and completed 1x sit<>stand for functional LE strengthening. Pt fatigued and requesting to perform more later after verbalizing understanding of exercise.    General Comments        Pertinent Vitals/Pain Pain Assessment: No/denies pain    Home Living                      Prior Function            PT Goals (current goals can now be found in the care plan section) Acute Rehab PT Goals Patient Stated Goal: to do yardwork, walk more frequently PT Goal Formulation: With patient/family Time For Goal Achievement: 04/23/21 (extended  by 2 weeks) Potential to Achieve Goals: Good Progress towards PT goals: Progressing toward goals    Frequency    Min 3X/week      PT Plan Current plan remains appropriate    Co-evaluation              AM-PAC PT "6 Clicks" Mobility   Outcome Measure  Help needed turning from your back to your side while in a flat bed without using bedrails?: None Help needed moving from lying on your back to sitting on the side of a flat bed without using bedrails?: None Help needed moving to and from a bed to a chair (including a  wheelchair)?: A Little Help needed standing up from a chair using your arms (e.g., wheelchair or bedside chair)?: A Little Help needed to walk in hospital room?: A Little Help needed climbing 3-5 steps with a railing? : A Little 6 Click Score: 20    End of Session Equipment Utilized During Treatment: Gait belt Activity Tolerance: Patient tolerated treatment well Patient left: in chair;with call bell/phone within reach Nurse Communication: Mobility status PT Visit Diagnosis: Muscle weakness (generalized) (M62.81);Difficulty in walking, not elsewhere classified (R26.2)     Time: 6063-0160 PT Time Calculation (min) (ACUTE ONLY): 24 min  Charges:  $Gait Training: 23-37 mins                     Verner Mould, DPT Acute Rehabilitation Services Office [phone] Pager [phone]     Jacques Navy 04/09/2021, 3:31 PM

## 2021-04-10 ENCOUNTER — Inpatient Hospital Stay (HOSPITAL_COMMUNITY): Payer: Medicare Other

## 2021-04-10 HISTORY — PX: IR SINUS/FIST TUBE CHK-NON GI: IMG673

## 2021-04-10 MED ORDER — HYDROMORPHONE HCL 1 MG/ML IJ SOLN: 0.5000 mg | INTRAMUSCULAR | Status: DC | PRN

## 2021-04-10 MED ORDER — IOHEXOL 300 MG/ML  SOLN: 20.0000 mL | Freq: Once | INTRAMUSCULAR | Status: DC | PRN

## 2021-04-10 NOTE — Plan of Care (Signed)
  Problem: Education: Goal: Knowledge of ostomy care will improve Outcome: Progressing Goal: Understanding of discharge needs will improve Outcome: Progressing   Problem: Bowel/Gastric/Urinary: Goal: Gastrointestinal status for postoperative course will improve Outcome: Progressing   Problem: Coping: Goal: Coping ability will improve Outcome: Progressing   Problem: Fluid Volume: Goal: Ability to achieve a balanced intake and output will improve Outcome: Progressing   Problem: Health Behavior/Discharge Planning: Goal: Ability to manage health-related needs will improve Outcome: Progressing   Problem: Nutrition: Goal: Will attain and maintain optimal nutritional status will improve Outcome: Progressing   Problem: Clinical Measurements: Goal: Postoperative complications will be avoided or minimized Outcome: Progressing   Problem: Skin Integrity: Goal: Will show signs of wound healing Outcome: Progressing Goal: Risk for impaired skin integrity will decrease Outcome: Progressing   Problem: Education: Goal: Knowledge of General Education information will improve Description: Including pain rating scale, medication(s)/side effects and non-pharmacologic comfort measures Outcome: Progressing   Problem: Health Behavior/Discharge Planning: Goal: Ability to manage health-related needs will improve Outcome: Progressing   Problem: Clinical Measurements: Goal: Ability to maintain clinical measurements within normal limits will improve Outcome: Progressing Goal: Will remain free from infection Outcome: Progressing Goal: Diagnostic test results will improve Outcome: Progressing Goal: Respiratory complications will improve Outcome: Progressing Goal: Cardiovascular complication will be avoided Outcome: Progressing   Problem: Activity: Goal: Risk for activity intolerance will decrease Outcome: Progressing   Problem: Nutrition: Goal: Adequate nutrition will be  maintained Outcome: Progressing   Problem: Coping: Goal: Level of anxiety will decrease Outcome: Progressing   Problem: Elimination: Goal: Will not experience complications related to bowel motility Outcome: Progressing   Problem: Pain Managment: Goal: General experience of comfort will improve Outcome: Progressing   Problem: Safety: Goal: Ability to remain free from injury will improve Outcome: Progressing   Problem: Skin Integrity: Goal: Risk for impaired skin integrity will decrease Outcome: Progressing

## 2021-04-10 NOTE — Progress Notes (Signed)
Rectal carcinoma ([place])  Subjective: Urinating better with foley out.  Still having some incontinence overnight  Objective: Vital signs in last 24 hours: Temp:  [97.6 F (36.4 C)-99.5 F (37.5 C)] 97.6 F (36.4 C) (06/02 0651) Pulse Rate:  [89-108] 89 (06/02 0651) Resp:  [18-20] 18 (06/02 0651) BP: (140-161)/(59-78) 161/78 (06/02 0651) SpO2:  [97 %-99 %] 98 % (06/02 0651) Last BM Date: 04/09/21  Intake/Output from previous day: 06/01 0701 - 06/02 0700 In: 979 [P.O.:540; I.V.:224; IV Piggyback:200] Out: 565 [Drains:215; Stool:350] Intake/Output this shift: Total I/O In: 240 [P.O.:240] Out: 0   General appearance: alert and cooperative GI: normal findings: soft, no TTP, no peritonitis, less distention Incision/Wound: clean, dry, intact JP: min cloudy SS fluid Ostomy: stool in bag LLQ drain: min purulent output RLQ drain: min cloudy SS fluid TG drain: min purulent fluid Lab Results:  No results found for this or any previous visit (from the past 24 hour(s)).   Studies/Results Radiology     MEDS, Scheduled . (feeding supplement) PROSource Plus  30 mL Oral BID BM  . sodium chloride   Intravenous Once  . acetaminophen  1,000 mg Oral Q6H  . bacitracin   Topical BID  . chlorhexidine  15 mL Mouth Rinse BID  . Chlorhexidine Gluconate Cloth  6 each Topical Daily  . feeding supplement  1 Container Oral TID BM  . lip balm  1 application Topical BID  . mouth rinse  15 mL Mouth Rinse q12n4p  . melatonin  3 mg Oral QHS  . multivitamin with minerals  1 tablet Oral Daily  . pantoprazole  40 mg Oral BID AC  . sodium chloride flush  5 mL Intracatheter Q8H     Assessment: Rectal carcinoma ([place])  S/p LAR and end ostomy Post op abscess: IR drain in LLQ and RLQ, completed course of Zosyn and Eraxis.  Zosyn restarted for uncontrolled abd infection on 5/11 and then switched to Unasyn per cultures.  Unasyn off 6/2 Hemorrhagic esophagitis, seems to be resolving, cont fulls for  now  Plan: Moderate Malnutrition related to chronic illness,cancer and cancer related treatments as evidenced by mild fat depletion,moderate fat depletion,mild muscle depletion,moderate muscle depletion,severe muscle depletion.  TPN off, eating more of the full liquids, nutritional shakes between meals, encouraged more PO intake today Acute blood loss anemia due to esophagitis: Hgb stable has had IV Fe and several transfusions ID:   Completed course of abx.  CT reviewed, will d/c surgical pelvic drain.  May be able to get out R sided IR drain as well RT for pulm toilet, no signs for pulmonary infection PT for ambulation  PO pain meds, cont to wean IV dilaudid  cont PO PPI for esophagitis, will stay with fulls for now.  Will need GI f/u as an outpt  Making continued progress  Will cont to monitor voiding.  Plan to recheck BMET and CBC on Fri  D/c planning   LOS: 36 days    Rosario Adie, MD  [facility], [place]    04/10/2021 12:54 PM

## 2021-04-10 NOTE — Progress Notes (Addendum)
IR.  History of rectal cancer s/p robotic LAR with end colostomy in OR 0/28/9022; complicated by development of intra-abdominal fluid collection s/p LUQ drain placement in IR 06/12/697; further complicated by development of additional intra-abdominal fluid collection s/p RLQ drain placement in IR 04/22/8306; further complicated by pelvic fluid collection s/p left TG drain placement in IR 04/03/2021.  Output of LUQ and RLQ minimal over past 24 hours. Repeat CT abdomen/pelvis ordered for today per [facility]: 1. Persistent but improving fluid and gas collection in the pelvic cul-de-sac with left transgluteal drainage catheter in good position. 2. Resolved right lower quadrant fluid collection with persistent drainage catheter. 3. Resolved left upper quadrant fluid collection with persistent drainage catheter. 4. Decreasing size of right intrahepatic fluid collection now measuring 4.0 x 4.1 cm compared to 4.9 x 4.9 cm previously. Likely resolving intrahepatic abscess. 5. No new or undrained fluid collections identified. 6. Small bilateral pleural effusions with associated atelectasis. 7. Additional ancillary findings as above.  Discussed case with Dr. Laurence Ferrari (IR) and Dr. Anselm Pancoast (IR) who recommend drain injections of LUQ/RLQ drains prior to possible removal- plan for image-guided LUQ/RLQ drain injections with possible removal in IR tentatively for today pending IR scheduling. Left TG drain to remain at this time. IR to follow.   ADDENDUM: Following drain injection by Dr. Anselm Pancoast, RLQ drain removed (removed intact, EBL = 0 mL, pressure dressing (gauze + tape) applied), LUQ drain to remain- if no output by tomorrow this drain can be removed per Dr. Anselm Pancoast. IR to follow.   Shane Graff Aayra Hornbaker, PA-C 04/10/2021, 1:00 PM

## 2021-04-10 NOTE — TOC Progression Note (Signed)
Transition of Care [facility]) - Progression Note   Patient Details  Name: Shane [name] MRN: [id number] Date of Birth: 05/30/1951  Transition of Care [facility]) CM/SW [place], LCSW Phone Number: 04/10/2021, 2:19 PM  Clinical Narrative: CSW made [facility] referrals to the following agencies:  [facility]: declined due to not having RN available for new admits [facility]: declined [facility]: declined due to complexity of RN needs [facility]: declined [facility]: declined [facility]: accepted as a pair  CSW updated patient. TOC to follow.  Expected Discharge Plan: [place] Barriers to Discharge: Continued Medical Work up  Expected Discharge Plan and Services Expected Discharge Plan: [place] In-house Referral: Clinical Social Work Discharge Planning Services: CM Consult Post Acute Care Choice: [place] arrangements for the past 2 months: Single Family Home  Readmission Risk Interventions Readmission Risk Prevention Plan 03/27/2021  Transportation Screening Complete  HRI or Home Care Consult Complete  Social Work Consult for [place] Planning/Counseling Complete  Medication Review Press [employer]) Complete  Some recent data might be hidden

## 2021-04-11 ENCOUNTER — Telehealth: Payer: Self-pay | Admitting: General Surgery

## 2021-04-11 LAB — CBC
HCT: 28.7 % — ABNORMAL LOW (ref 39.0–52.0)
Hemoglobin: 9 g/dL — ABNORMAL LOW (ref 13.0–17.0)
MCH: 26.5 pg (ref 26.0–34.0)
MCHC: 31.4 g/dL (ref 30.0–36.0)
MCV: 84.4 fL (ref 80.0–100.0)
Platelets: 501 10*3/uL — ABNORMAL HIGH (ref 150–400)
RBC: 3.4 MIL/uL — ABNORMAL LOW (ref 4.22–5.81)
RDW: 18.3 % — ABNORMAL HIGH (ref 11.5–15.5)
WBC: 10 10*3/uL (ref 4.0–10.5)
nRBC: 0 % (ref 0.0–0.2)

## 2021-04-11 LAB — BASIC METABOLIC PANEL
Anion gap: 6 (ref 5–15)
BUN: 15 mg/dL (ref 8–23)
CO2: 26 mmol/L (ref 22–32)
Calcium: 8.9 mg/dL (ref 8.9–10.3)
Chloride: 103 mmol/L (ref 98–111)
Creatinine, Ser: 0.69 mg/dL (ref 0.61–1.24)
GFR, Estimated: >60 mL/min (ref >60)
Glucose, Bld: 101 mg/dL — ABNORMAL HIGH (ref 70–99)
Potassium: 3.2 mmol/L — ABNORMAL LOW (ref 3.5–5.1)
Sodium: 135 mmol/L (ref 135–145)

## 2021-04-11 MED ORDER — OXYCODONE HCL 5 MG PO TABS: 5.0000 mg | ORAL_TABLET | Freq: Four times a day (QID) | ORAL | 0 refills | Status: DC | PRN

## 2021-04-11 MED ORDER — PANTOPRAZOLE SODIUM 40 MG PO TBEC: 40.0000 mg | DELAYED_RELEASE_TABLET | Freq: Two times a day (BID) | ORAL | 2 refills | Status: DC

## 2021-04-11 NOTE — Progress Notes (Signed)
Patient and spouse were given discharge instructions, and all questions were answered. Patient was stable for discharge and was taken to the main exit by wheelchair. 

## 2021-04-11 NOTE — Progress Notes (Addendum)
[name], male   DOB: Jan 15, 1951, 70 y.o.   MRN: [id number] Patient's left upper quadrant abdominal drain was removed today secondary to no significant output today.  His left pelvic/transgluteal drain remains in place.  Surgery team is planning for his discharge home today.  He will be scheduled in the IR clinic in 2 weeks for follow-up CT and possible drain injection.  Recommend once daily irrigation of drain with 5 cc sterile saline as outpatient along with output recording and dressing changes every 1 to 2 days.  Nurse/patient aware of above. Pt can call [phone] with any drain related questions.

## 2021-04-11 NOTE — Discharge Instructions (Signed)
ABDOMINAL SURGERY: POST OP INSTRUCTIONS ° °1. DIET: Follow a light bland diet the first 24 hours after arrival home, such as soup, liquids, crackers, etc.  Be sure to include lots of fluids daily.  Avoid fast food or heavy meals as your are more likely to get nauseated.  Do not eat any uncooked fruits or vegetables for the next 2 weeks as your colon heals. °2. Take your usually prescribed home medications unless otherwise directed. °3. PAIN CONTROL: °a. Pain is best controlled by a usual combination of three different methods TOGETHER: °i. Ice/Heat °ii. Over the counter pain medication °iii. Prescription pain medication °b. Most patients will experience some swelling and bruising around the incisions.  Ice packs or heating pads (30-60 minutes up to 6 times a day) will help. Use ice for the first few days to help decrease swelling and bruising, then switch to heat to help relax tight/sore spots and speed recovery.  Some people prefer to use ice alone, heat alone, alternating between ice & heat.  Experiment to what works for you.  Swelling and bruising can take several weeks to resolve.   °c. It is helpful to take an over-the-counter pain medication regularly for the first few weeks.  Choose one of the following that works best for you: °i. Naproxen (Aleve, etc)  Two 220mg tabs twice a day °ii. Ibuprofen (Advil, etc) Three 200mg tabs four times a day (every meal & bedtime) °iii. Acetaminophen (Tylenol, etc) 500-650mg four times a day (every meal & bedtime) °d. A  prescription for pain medication (such as oxycodone, hydrocodone, etc) should be given to you upon discharge.  Take your pain medication as prescribed.  °i. If you are having problems/concerns with the prescription medicine (does not control pain, nausea, vomiting, rash, itching, etc), please call us [phone] to see if we need to switch you to a different pain medicine that will work better for you and/or control your side effect better. °ii. If you  need a refill on your pain medication, please contact your pharmacy.  They will contact our office to request authorization. Prescriptions will not be filled after 5 pm or on week-ends. °4. Avoid getting constipated.  Between the surgery and the pain medications, it is common to experience some constipation.  Increasing fluid intake and taking a fiber supplement (such as Metamucil, Citrucel, FiberCon, MiraLax, etc) 1-2 times a day regularly will usually help prevent this problem from occurring.  A mild laxative (prune juice, Milk of Magnesia, MiraLax, etc) should be taken according to package directions if there are no bowel movements after 48 hours.   °5. Watch out for diarrhea.  If you have many loose bowel movements, simplify your diet to bland foods & liquids for a few days.  Stop any stool softeners and decrease your fiber supplement.  Switching to mild anti-diarrheal medications (Kayopectate, Pepto Bismol) can help.  If this worsens or does not improve, please call us. °6. Wash / shower every day.  You may shower over the incision / wound.  Avoid baths until the skin is fully healed.  Continue to shower over incision(s) after the dressing is off. °7. ACTIVITIES as tolerated:   °a. You may resume regular (light) daily activities beginning the next day--such as daily self-care, walking, climbing stairs--gradually increasing activities as tolerated.  If you can walk 30 minutes without difficulty, it is safe to try more intense activity such as jogging, treadmill, bicycling, low-impact aerobics, swimming, etc. °b. Save the most intensive and   strenuous activity for last such as sit-ups, heavy lifting, contact sports, etc  Refrain from any heavy lifting or straining until you are off narcotics for pain control.   °c. DO NOT PUSH THROUGH PAIN.  Let pain be your guide: If it hurts to do something, don't do it.  Pain is your body warning you to avoid that activity for another week until the pain goes down. °d. You may  drive when you are no longer taking prescription pain medication, you can comfortably wear a seatbelt, and you can safely maneuver your car and apply brakes. °e. You may have sexual intercourse when it is comfortable.  °8. FOLLOW UP in our office °a. Please call [facility] at [phone] to set up an appointment to see your surgeon in the office for a follow-up appointment approximately 1-2 weeks after your surgery. °b. Make sure that you call for this appointment the day you arrive home to insure a convenient appointment time. °10. IF YOU HAVE DISABILITY OR FAMILY LEAVE FORMS, BRING THEM TO THE OFFICE FOR PROCESSING.  DO NOT GIVE THEM TO YOUR DOCTOR. ° ° °WHEN TO CALL US [phone]: °1. Poor pain control °2. Reactions / problems with new medications (rash/itching, nausea, etc)  °3. Fever over 101.5 F (38.5 C) °4. Inability to urinate °5. Nausea and/or vomiting °6. Worsening swelling or bruising °7. Continued bleeding from incision. °8. Increased pain, redness, or drainage from the incision ° °The clinic staff is available to answer your questions during regular business hours (8:30am-5pm).  Please don’t hesitate to call and ask to speak to one of our nurses for clinical concerns.   A surgeon from [facility] is always on call at the hospitals °  °If you have a medical emergency, go to the nearest emergency room or call 911. °  ° °[facility] °[address], [address], [place],   [address] ? °MAIN: [phone] ? TOLL FREE: [phone] ? °FAX [phone] °[email or web address] ° ° °

## 2021-04-11 NOTE — Progress Notes (Signed)
Physical Therapy Treatment Patient Details Name: Shane [name] MRN: [id number] DOB: 04-Dec-1950 Today's Date: 04/11/2021    History of Present Illness The patient is a 70 year old male who presents with colorectal cancer, in  ED 03/05/21 with complaints of hemorrhoids and rectal bleeding, anemia. CT showed a large rectal mass. S/P ROBOTIC ASSISTED LOWER ANTERIOR RESECTION WITH END COLOSTOMY  MOBILIZATION OF THE SPLENIC FLEXURE  on 03/05/21, post op tachycardia and hypotension and abscess-drains placed.    PT Comments    Patient making good progress with mobility and demonstrates safe walker management to ambulate ~2x20' with RW in room. No overt LOB and pt steady when turning from walker to wash hands at sink. Patient required rest break after ambulation prior to completing therapeutic exercises. Pt provided handout for HEP and completed all exercises with short rest breaks for recovery. He will continue to benefit from skilled PT to address impairments and progress independence with mobility.     Follow Up Recommendations  Home health PT     Equipment Recommendations  None recommended by PT    Recommendations for Other Services       Precautions / Restrictions Precautions Precautions: Fall Precaution Comments: colostomy,  5 drains Restrictions Weight Bearing Restrictions: No    Mobility  Bed Mobility Overal bed mobility: Modified Independent Bed Mobility: Supine to Sit     Supine to sit: Modified independent (Device/Increase time);HOB elevated     General bed mobility comments: incr time, no physical assist    Transfers Overall transfer level: Needs assistance Equipment used: Rolling walker (2 wheeled) Transfers: Sit to/from Stand Sit to Stand: Supervision         General transfer comment: pt with safe technique for power up from EOB and toilet, no assist required  Ambulation/Gait Ambulation/Gait assistance: Supervision Gait Distance (Feet): 40 Feet Assistive  device: Rolling walker (2 wheeled) Gait Pattern/deviations: Step-through pattern;Decreased stride length;Shuffle (decreased bil ankle dorsiflexion; compensates with increased hip flexion) Gait velocity: [facility]   General Gait Details: pt demonstrates safe use of RW and maintains safe proximity, pt ambulated ~2x20' in room to use bathroom, supervision for safety.   Stairs             Wheelchair Mobility    Modified Rankin (Stroke Patients Only)       Balance Overall balance assessment: Needs assistance Sitting-balance support: Feet supported;No upper extremity supported Sitting balance-Leahy Scale: Good     Standing balance support: Bilateral upper extremity supported Standing balance-Leahy Scale: Fair                              Cognition Arousal/Alertness: Awake/alert Behavior During Therapy: WFL for tasks assessed/performed Overall Cognitive Status: Within Functional Limits for tasks assessed                                        Exercises Other Exercises Other Exercises: 10x Sit<>Stands Other Exercises: 10 reps bil LE: standing marching, hip abduction, hip extension, heel raises. bil UE support on locked bed rail.    General Comments        Pertinent Vitals/Pain Pain Assessment: No/denies pain    Home Living                      Prior Function  PT Goals (current goals can now be found in the care plan section) Acute Rehab PT Goals Patient Stated Goal: to do yardwork, walk more frequently PT Goal Formulation: With patient/family Time For Goal Achievement: 04/23/21 (extended by 2 weeks) Potential to Achieve Goals: Good Progress towards PT goals: Progressing toward goals    Frequency    Min 3X/week      PT Plan Current plan remains appropriate    Co-evaluation              AM-PAC PT "6 Clicks" Mobility   Outcome Measure  Help needed turning from your back to your side while in a flat bed  without using bedrails?: None Help needed moving from lying on your back to sitting on the side of a flat bed without using bedrails?: None Help needed moving to and from a bed to a chair (including a wheelchair)?: A Little Help needed standing up from a chair using your arms (e.g., wheelchair or bedside chair)?: A Little Help needed to walk in hospital room?: A Little Help needed climbing 3-5 steps with a railing? : A Little 6 Click Score: 20    End of Session Equipment Utilized During Treatment: Gait belt Activity Tolerance: Patient tolerated treatment well Patient left: in chair;with call bell/phone within reach Nurse Communication: Mobility status PT Visit Diagnosis: Muscle weakness (generalized) (M62.81);Difficulty in walking, not elsewhere classified (R26.2)     Time: 3762-8315 PT Time Calculation (min) (ACUTE ONLY): 36 min  Charges:  $Gait Training: 8-22 mins $Therapeutic Exercise: 8-22 mins                     Verner Mould, DPT Acute Rehabilitation Services Office [phone] Pager [phone]     Jacques Navy 04/11/2021, 12:10 PM

## 2021-04-11 NOTE — Telephone Encounter (Signed)
Left a message for the patient to contact the office to schedule a follow up and an EGD/dil for esophageal stricture. Needs ASAP

## 2021-04-11 NOTE — Discharge Summary (Signed)
Physician Discharge Summary  [name] MRN: [id number] DOB/AGE: Nov 25, 1950 70 y.o.  Admit date: 03/05/2021 Discharge date: 04/11/2021  Admission Diagnoses: rectal cancer  Discharge Diagnoses:  Principal Problem:   Rectal carcinoma ([place]) Active Problems:   Degenerative cervical disc   Abnormality of gait   Iron deficiency anemia   Port-A-Cath in place   AKI (acute kidney injury) ([place])   Hypercholesteremia   Pulmonary nodules   Hypoxia   Phimosis of penis s/p dilitation 03/05/2021   Delirium   Tachypnea   Acute respiratory distress   Ileus ([facility])   Positive D dimer   Anemia   Hypotension   Malnutrition of moderate degree   Hypokalemia   Hypernatremia   Acute upper GI bleeding   Enterococcal sepsis ([facility])   Hyperglycemia   Colostomy in place ([place])   Pressure injury of skin   Discharged Condition: good  Hospital Course: Patient admitted to the hospital after extremely difficult low anterior resection due to fibrotic pelvis after radiation therapy for rectal cancer.  His hospital course was complicated by multiple abdominal abscesses requiring drainage in interventional radiology and need for intensive care for managing sepsis.  Patient slowly improved on IV antibiotics and drainage of abscesses.  His nutrition was maintained on TPN until he could tolerate a diet.  Patient did have some active upper GI bleeding approximately 1 week into his stay at the hospital.  This was evaluated via EGD and noted to be hemorrhagic esophagitis.  He was placed on Carafate and PPI.  His hemoglobin stabilized after transfusion.  He was weaned off of the Carafate and continued on the PPI.  During advancing his diet, it was noted that he had difficulty with swallowing solid foods.  He tolerated full liquids without difficulty.  Patient will be discharged home on full liquids with follow-up with GI for further evaluation of possible esophageal stricture.  This inflammation does appear to be  much better on follow-up CT scan.  Patient initially had urinary retention after removal of Foley catheter.  This was replaced and his pelvic abscess was drained appropriately.  Foley was removed and patient had no further signs of retention.  Patient will continue transgluteal drain at home with follow-up in interventional radiology.  He will follow-up with oncology for surveillance.  He has completed all of his oncologic treatments for the moment.  He will work on increasing his nutrition at home with home health and home PT.  Consults: pulmonary/intensive care, GI and Interventional Radiology  Significant Diagnostic Studies: labs: cbc, cmet, coags, radiology: CT scan: multiple abd abscesses and endoscopy: EGD: hemorrhagic esophagitis   Treatments: IV hydration, antibiotics: Zosyn and Unasyn, analgesia: acetaminophen, Dilaudid and oxycodone, TPN, respiratory therapy: O2 and albuterol/atropine nebulizer, procedures: PICC line and percutaneous drainage catheter placement and surgery: robotic assisted LAR  Discharge Exam: Blood pressure (!) 149/77, pulse (!) 101, temperature 98.5 F (36.9 C), temperature source Oral, resp. rate 18, weight 79.6 kg, SpO2 100 %. General appearance: alert and cooperative GI: normal findings: soft, non-tender Incision/Wound: clean, healing well  Disposition: home with home health   Allergies as of 04/11/2021      Reactions   Nsaids Other (See Comments)   Kidney disease      Medication List    STOP taking these medications   capecitabine 500 MG tablet Commonly known as: XELODA   diphenoxylate-atropine 2.5-0.025 MG tablet Commonly known as: LOMOTIL   HYDROcodone-acetaminophen 5-325 MG tablet Commonly known as: NORCO/VICODIN   hydrocortisone 2.5 %  rectal cream Commonly known as: Anusol-HC   lidocaine-prilocaine cream Commonly known as: EMLA     TAKE these medications   Diclofenac Sodium 2 % Soln Place 2 g onto the skin 2 (two) times daily. What  changed:   when to take this  reasons to take this   docusate sodium 100 MG capsule Commonly known as: COLACE Take 100 mg by mouth daily as needed for mild constipation.   ondansetron 8 MG tablet Commonly known as: ZOFRAN Take 1 tablet (8 mg total) by mouth every 8 (eight) hours as needed for nausea or vomiting. Start 72 hours after each IV chemotherapy treatment   oxyCODONE 5 MG immediate release tablet Commonly known as: Oxy IR/ROXICODONE Take 1-2 tablets (5-10 mg total) by mouth every 6 (six) hours as needed for moderate pain, severe pain or breakthrough pain.   pantoprazole 40 MG tablet Commonly known as: PROTONIX Take 1 tablet (40 mg total) by mouth 2 (two) times daily before a meal.   pravastatin 20 MG tablet Commonly known as: PRAVACHOL TAKE 1 TABLET(20 MG) BY MOUTH DAILY What changed: See the new instructions.   prochlorperazine 10 MG tablet Commonly known as: COMPAZINE Take 1 tablet (10 mg total) by mouth every 6 (six) hours as needed for nausea.       Follow-up Information    [facility].   Specialty: Home Health Services Why: PT, RN       Leighton Ruff, MD. Schedule an appointment as soon as possible for a visit in 2 week(s).   Specialties: General Surgery, Colon and Rectal Surgery Contact information: [place] [address] [phone]        [facility] Follow up.   Specialty: Radiology Contact information: [place] [id number] [place] [phone]              Signed: Rosario Adie 06/09/2750, 3:00 PM

## 2021-04-11 NOTE — Telephone Encounter (Signed)
-----   Message from [place], DO sent at 04/11/2021  8:41 AM EDT ----- No problem at all. We can definitely get him in.   Olivia Mackie, please see message below. Please book for appt in office with me (ok to overbook) along with scheduling EGD with dil in [place].   Thanks. ----- Message ----- From: Leighton Ruff, MD Sent: 03/16/9727   8:33 AM EDT To: Lavena Bullion, DO  You scoped this guy a few weeks ago, and he had hemorrhagic esophagitis.  He is now better and ready to go home but he feels like solid foods get stuck.  We have him on full liquids/pureed diet and he is doing fine right now with that.  Do you mind seeing him back in the office in a few weeks to address what I assume is an esophageal stricture?  Elmo Putt

## 2021-04-11 NOTE — Progress Notes (Signed)
Rectal carcinoma (HCC)  Subjective: Feels better every day.  R sided drains removed yesterday.  Tolerating liquids well  Objective: Vital signs in last 24 hours: Temp:  [98.3 F (36.8 C)-99.1 F (37.3 C)] 98.8 F (37.1 C) (06/03 0525) Pulse Rate:  [90-101] 90 (06/03 0525) Resp:  [18] 18 (06/03 0525) BP: (136-163)/(57-77) 140/57 (06/03 0525) SpO2:  [99 %-100 %] 100 % (06/03 0525) Last BM Date: 04/10/21  Intake/Output from previous day: 06/02 0701 - 06/03 0700 In: 960 [P.O.:960] Out: 570 [Drains:170; Stool:400] Intake/Output this shift: No intake/output data recorded.  General appearance: alert and cooperative GI: normal findings: soft, no TTP, no peritonitis, no distention Incision/Wound: clean, dry, intact Ostomy: stool in bag LLQ drain: min clear output TG drain: min purulent fluid Lab Results:  Results for orders placed or performed during the hospital encounter of 03/05/21 (from the past 24 hour(s))  Basic metabolic panel     Status: Abnormal   Collection Time: 04/11/21  2:40 AM  Result Value Ref Range   Sodium 135 135 - 145 mmol/L   Potassium 3.2 (L) 3.5 - 5.1 mmol/L   Chloride 103 98 - 111 mmol/L   CO2 26 22 - 32 mmol/L   Glucose, Bld 101 (H) 70 - 99 mg/dL   BUN 15 8 - 23 mg/dL   Creatinine, Ser 0.69 0.61 - 1.24 mg/dL   Calcium 8.9 8.9 - 10.3 mg/dL   GFR, Estimated >60 >60 mL/min   Anion gap 6 5 - 15  CBC     Status: Abnormal   Collection Time: 04/11/21  2:40 AM  Result Value Ref Range   WBC 10.0 4.0 - 10.5 K/uL   RBC 3.40 (L) 4.22 - 5.81 MIL/uL   Hemoglobin 9.0 (L) 13.0 - 17.0 g/dL   HCT 28.7 (L) 39.0 - 52.0 %   MCV 84.4 80.0 - 100.0 fL   MCH 26.5 26.0 - 34.0 pg   MCHC 31.4 30.0 - 36.0 g/dL   RDW 18.3 (H) 11.5 - 15.5 %   Platelets 501 (H) 150 - 400 K/uL   nRBC 0.0 0.0 - 0.2 %     Studies/Results Radiology     MEDS, Scheduled . (feeding supplement) PROSource Plus  30 mL Oral BID BM  . sodium chloride   Intravenous Once  . acetaminophen  1,000  mg Oral Q6H  . bacitracin   Topical BID  . chlorhexidine  15 mL Mouth Rinse BID  . Chlorhexidine Gluconate Cloth  6 each Topical Daily  . feeding supplement  1 Container Oral TID BM  . lip balm  1 application Topical BID  . mouth rinse  15 mL Mouth Rinse q12n4p  . melatonin  3 mg Oral QHS  . multivitamin with minerals  1 tablet Oral Daily  . pantoprazole  40 mg Oral BID AC  . sodium chloride flush  5 mL Intracatheter Q8H     Assessment: Rectal carcinoma ([place])  S/p LAR and end ostomy Post op abscess: IR drain in LLQ and RLQ, completed course of Zosyn and Eraxis.  Zosyn restarted for uncontrolled abd infection on 5/11 and then switched to Unasyn per cultures.  Unasyn off 6/2 Hemorrhagic esophagitis, seems to be resolving, looks better on CT, cont fulls for now  Plan: Moderate Malnutrition related to chronic illness,cancer and cancer related treatments as evidenced by mild fat depletion,moderate fat depletion,mild muscle depletion,moderate muscle depletion,severe muscle depletion.  TPN off, eating more of the full liquids, nutritional shakes between meals, encouraged more PO  intake today Acute blood loss anemia due to esophagitis: Hgb stable has had IV Fe and several transfusions ID:   Completed course of abx.  CT reviewed, surgical pelvic drain removed. R sided IR drain removed as well RT for pulm toilet, no signs for pulmonary infection PT for ambulation  PO pain meds mainly due to pain at drain site  cont PO PPI for esophagitis, will stay with fulls for now.  GI will see as an outpatient  Urinary retention resolved.  WBC normal off abx  Possible d/c later today or tomorrow   LOS: 37 days    Rosario Adie, [facility], [place]    04/11/2021 8:30 AM

## 2021-04-11 NOTE — Plan of Care (Signed)
  Problem: Bowel/Gastric/Urinary: Goal: Gastrointestinal status for postoperative course will improve Outcome: Progressing   Problem: Coping: Goal: Coping ability will improve Outcome: Progressing   Problem: Fluid Volume: Goal: Ability to achieve a balanced intake and output will improve Outcome: Progressing   Problem: Health Behavior/Discharge Planning: Goal: Ability to manage health-related needs will improve Outcome: Progressing   Problem: Nutrition: Goal: Will attain and maintain optimal nutritional status will improve Outcome: Progressing   Problem: Clinical Measurements: Goal: Postoperative complications will be avoided or minimized Outcome: Progressing   Problem: Skin Integrity: Goal: Will show signs of wound healing Outcome: Progressing

## 2021-04-12 DIAGNOSIS — Z79891 Long term (current) use of opiate analgesic: Secondary | ICD-10-CM | POA: Diagnosis not present

## 2021-04-12 DIAGNOSIS — Z87891 Personal history of nicotine dependence: Secondary | ICD-10-CM | POA: Diagnosis not present

## 2021-04-12 DIAGNOSIS — K9189 Other postprocedural complications and disorders of digestive system: Secondary | ICD-10-CM | POA: Diagnosis not present

## 2021-04-12 DIAGNOSIS — N179 Acute kidney failure, unspecified: Secondary | ICD-10-CM | POA: Diagnosis not present

## 2021-04-12 DIAGNOSIS — M179 Osteoarthritis of knee, unspecified: Secondary | ICD-10-CM | POA: Diagnosis not present

## 2021-04-12 DIAGNOSIS — C19 Malignant neoplasm of rectosigmoid junction: Secondary | ICD-10-CM | POA: Diagnosis not present

## 2021-04-12 DIAGNOSIS — D630 Anemia in neoplastic disease: Secondary | ICD-10-CM | POA: Diagnosis not present

## 2021-04-12 DIAGNOSIS — Z9181 History of falling: Secondary | ICD-10-CM | POA: Diagnosis not present

## 2021-04-12 DIAGNOSIS — Z433 Encounter for attention to colostomy: Secondary | ICD-10-CM | POA: Diagnosis not present

## 2021-04-14 ENCOUNTER — Other Ambulatory Visit (HOSPITAL_COMMUNITY): Payer: Self-pay | Admitting: Radiology

## 2021-04-14 DIAGNOSIS — K651 Peritoneal abscess: Secondary | ICD-10-CM

## 2021-04-14 NOTE — Telephone Encounter (Signed)
LVM for the patient to call the office to schedule a follow up and an EGD/dil.

## 2021-04-18 DIAGNOSIS — M179 Osteoarthritis of knee, unspecified: Secondary | ICD-10-CM | POA: Diagnosis not present

## 2021-04-18 DIAGNOSIS — Z87891 Personal history of nicotine dependence: Secondary | ICD-10-CM | POA: Diagnosis not present

## 2021-04-18 DIAGNOSIS — Z433 Encounter for attention to colostomy: Secondary | ICD-10-CM | POA: Diagnosis not present

## 2021-04-18 DIAGNOSIS — C19 Malignant neoplasm of rectosigmoid junction: Secondary | ICD-10-CM | POA: Diagnosis not present

## 2021-04-18 DIAGNOSIS — K9189 Other postprocedural complications and disorders of digestive system: Secondary | ICD-10-CM | POA: Diagnosis not present

## 2021-04-18 DIAGNOSIS — Z79891 Long term (current) use of opiate analgesic: Secondary | ICD-10-CM | POA: Diagnosis not present

## 2021-04-18 DIAGNOSIS — N179 Acute kidney failure, unspecified: Secondary | ICD-10-CM | POA: Diagnosis not present

## 2021-04-18 DIAGNOSIS — Z9181 History of falling: Secondary | ICD-10-CM | POA: Diagnosis not present

## 2021-04-18 DIAGNOSIS — D630 Anemia in neoplastic disease: Secondary | ICD-10-CM | POA: Diagnosis not present

## 2021-04-20 DIAGNOSIS — Z433 Encounter for attention to colostomy: Secondary | ICD-10-CM | POA: Diagnosis not present

## 2021-04-20 DIAGNOSIS — Z9181 History of falling: Secondary | ICD-10-CM | POA: Diagnosis not present

## 2021-04-20 DIAGNOSIS — C19 Malignant neoplasm of rectosigmoid junction: Secondary | ICD-10-CM | POA: Diagnosis not present

## 2021-04-20 DIAGNOSIS — K9189 Other postprocedural complications and disorders of digestive system: Secondary | ICD-10-CM | POA: Diagnosis not present

## 2021-04-20 DIAGNOSIS — N179 Acute kidney failure, unspecified: Secondary | ICD-10-CM | POA: Diagnosis not present

## 2021-04-20 DIAGNOSIS — D630 Anemia in neoplastic disease: Secondary | ICD-10-CM | POA: Diagnosis not present

## 2021-04-20 DIAGNOSIS — Z87891 Personal history of nicotine dependence: Secondary | ICD-10-CM | POA: Diagnosis not present

## 2021-04-20 DIAGNOSIS — M179 Osteoarthritis of knee, unspecified: Secondary | ICD-10-CM | POA: Diagnosis not present

## 2021-04-20 DIAGNOSIS — Z79891 Long term (current) use of opiate analgesic: Secondary | ICD-10-CM | POA: Diagnosis not present

## 2021-04-21 NOTE — Telephone Encounter (Signed)
LVM for the patient to contact the office. My chart message sent to patient to call and schedule.

## 2021-04-21 NOTE — Telephone Encounter (Signed)
Patient states he is not having any issues at this time with swallowing and does not wish to schedule at this time. I advised him that I would send a message to Dr Bryan Lemma regarding this. Patient verbalized understanding

## 2021-04-22 ENCOUNTER — Ambulatory Visit
Admission: RE | Admit: 2021-04-22 | Discharge: 2021-04-22 | Disposition: A | Discharge status: Home / Self Care | Payer: Medicare Other | Source: Ambulatory Visit | Attending: Radiology | Admitting: Radiology

## 2021-04-22 ENCOUNTER — Other Ambulatory Visit (HOSPITAL_COMMUNITY): Payer: Self-pay | Admitting: Radiology

## 2021-04-22 ENCOUNTER — Encounter: Payer: Self-pay | Admitting: *Deleted

## 2021-04-22 ENCOUNTER — Other Ambulatory Visit: Payer: Self-pay | Admitting: General Surgery

## 2021-04-22 ENCOUNTER — Other Ambulatory Visit: Payer: Self-pay | Admitting: Radiology

## 2021-04-22 DIAGNOSIS — K651 Peritoneal abscess: Secondary | ICD-10-CM

## 2021-04-22 DIAGNOSIS — Z433 Encounter for attention to colostomy: Secondary | ICD-10-CM | POA: Diagnosis not present

## 2021-04-22 DIAGNOSIS — C19 Malignant neoplasm of rectosigmoid junction: Secondary | ICD-10-CM | POA: Diagnosis not present

## 2021-04-22 DIAGNOSIS — K6811 Postprocedural retroperitoneal abscess: Secondary | ICD-10-CM | POA: Diagnosis not present

## 2021-04-22 DIAGNOSIS — I700 Atherosclerosis of aorta: Secondary | ICD-10-CM | POA: Diagnosis not present

## 2021-04-22 DIAGNOSIS — C20 Malignant neoplasm of rectum: Secondary | ICD-10-CM | POA: Diagnosis not present

## 2021-04-22 DIAGNOSIS — Z978 Presence of other specified devices: Secondary | ICD-10-CM | POA: Diagnosis not present

## 2021-04-22 DIAGNOSIS — N281 Cyst of kidney, acquired: Secondary | ICD-10-CM | POA: Diagnosis not present

## 2021-04-22 DIAGNOSIS — Z872 Personal history of diseases of the skin and subcutaneous tissue: Secondary | ICD-10-CM | POA: Diagnosis not present

## 2021-04-22 DIAGNOSIS — Z933 Colostomy status: Secondary | ICD-10-CM | POA: Diagnosis not present

## 2021-04-22 HISTORY — PX: IR RADIOLOGIST EVAL & MGMT: IMG5224

## 2021-04-22 MED ORDER — IOPAMIDOL (ISOVUE-300) INJECTION 61%
100.0000 mL | Freq: Once | INTRAVENOUS | Status: AC | PRN
  Administered 2021-04-22: 100 mL via INTRAVENOUS

## 2021-04-22 MED ORDER — SODIUM CHLORIDE 0.9 % IJ SOLN: 10.0000 mL | Freq: Every day | INTRAMUSCULAR | 3 refills | Status: AC

## 2021-04-22 NOTE — Progress Notes (Signed)
Referring Physician(s): Dr. Joyice Faster  Chief Complaint: The patient is seen in follow up today s/p abscess drain a fluid collection in the pelvic cul-de-sac placed on 5.26.22  RUQ  an LUQ abscess drains previously removed.   History of present illness:  70 y.o.  male outpatient. History of rectal cancer s/p LAR with end colostomy on 4.27.22. Complicated by multiple post op intra abdominal abscesses s/p intra abscess drains X 3 placed by IR . On 5.03.22 IR placed a 12 Fr left upper quadrant drain with aspiration of 160 ml of purulent fluid. Cultures grew enterococcus faecalis. Patient continued to have recurrent fevers and  on 5.12.22 IR placed a second abscess drain to a new right pericolic gutter fluid collection. 75 ml of purulent fluid was aspirated and again cultures grew enterococcus faecalis. On 5.26.22 IR placed a 3rd drain into a fluid collection in the pelvic cul-de-sac via left trans gluteal approach. 100 ml of fluid aspiration and cultures for the third drain also grew enterococcus faecalis. Prior to discharge on 6.2.22 an abscessogram was performed .  The right lower quadrant abscess was shown to be resolved and the drain was removed. The LUQ abscess drain was removed on 6.3.22. The patient presents to the drain clinic for further evaluation of the pelvic cul-de-sac (via a transgluteal approach) abscess drain.  Patient is being followed by Dr. Joyice Faster.  Past Medical History:  Diagnosis Date   Allergy    Arthritis    knee   Rectal cancer ([place]) 08/2020    Past Surgical History:  Procedure Laterality Date   ESOPHAGOGASTRODUODENOSCOPY (EGD) WITH PROPOFOL N/A 03/14/2021   Procedure: ESOPHAGOGASTRODUODENOSCOPY (EGD) WITH PROPOFOL;  Surgeon: Lavena Bullion, DO;  Location: [facility];  Service: Gastroenterology;  Laterality: N/A;   IR IMAGING GUIDED PORT INSERTION  07/25/2020   IR SINUS/FIST TUBE CHK-NON GI  04/10/2021   IR SINUS/FIST TUBE CHK-NON GI  04/10/2021   IR US GUIDE BX  ASP/DRAIN  03/11/2021   OSTOMY N/A 03/05/2021   Procedure: OSTOMY;  Surgeon: Leighton Ruff, MD;  Location: [facility];  Service: General;  Laterality: N/A;   WISDOM TOOTH EXTRACTION     XI ROBOTIC ASSISTED LOWER ANTERIOR RESECTION N/A 03/05/2021   Procedure: XI ROBOTIC ASSISTED LOWER ANTERIOR RESECTION WITH MOBILIZATION OF THE SPLENIC FLEXURE OF THE COLON AND END COLOSTOMY;  Surgeon: Leighton Ruff, MD;  Location: [facility];  Service: General;  Laterality: N/A;  6 HOURS    Allergies: Nsaids  Medications: Prior to Admission medications   Medication Sig Start Date End Date Taking? Authorizing Provider  Diclofenac Sodium 2 % SOLN Place 2 g onto the skin 2 (two) times daily. Patient taking differently: Place 2 g onto the skin daily as needed (pain). 06/30/19   Lyndal Pulley, DO  docusate sodium (COLACE) 100 MG capsule Take 100 mg by mouth daily as needed for mild constipation.     [provider]  ondansetron (ZOFRAN) 8 MG tablet Take 1 tablet (8 mg total) by mouth every 8 (eight) hours as needed for nausea or vomiting. Start 72 hours after each IV chemotherapy treatment 07/30/20   Ladell Pier, MD  oxyCODONE (OXY IR/ROXICODONE) 5 MG immediate release tablet Take 1-2 tablets (5-10 mg total) by mouth every 6 (six) hours as needed for moderate pain, severe pain or breakthrough pain. 06/10/94   Leighton Ruff, MD  pantoprazole (PROTONIX) 40 MG tablet Take 1 tablet (40 mg total) by mouth 2 (two) times daily before a meal.  07/16/27   Leighton Ruff, MD  pravastatin (PRAVACHOL) 20 MG tablet TAKE 1 TABLET(20 MG) BY MOUTH DAILY Patient taking differently: Take 20 mg by mouth daily. 11/12/20   Marrian Salvage, FNP  prochlorperazine (COMPAZINE) 10 MG tablet Take 1 tablet (10 mg total) by mouth every 6 (six) hours as needed for nausea. 07/30/20   Ladell Pier, MD     Family History  Problem Relation Age of Onset   Healthy Mother    Hypertension Father    Stroke Father    Colon cancer Neg Hx     Esophageal cancer Neg Hx    Stomach cancer Neg Hx    Rectal cancer Neg Hx     Social History   Socioeconomic History   Marital status: Married    Spouse name: Not on file   Number of children: 2   Years of education: 14   Highest education level: Not on file  Occupational History   Occupation: Armed forces technical officer    Comment: retired  Tobacco Use   Smoking status: Former    Packs/day: 1.00    Years: 10.00    Pack years: 10.00    Types: Cigarettes    Quit date: 08/09/1978    Years since quitting: 42.7   Smokeless tobacco: Never  Vaping Use   Vaping Use: Never used  Substance and Sexual Activity   Alcohol use: No    Alcohol/week: 0.0 standard drinks   Drug use: No   Sexual activity: Not Currently  Other Topics Concern   Not on file  Social History Narrative   Fun: [name] out with his grandkids, 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   Social Determinants of Radio broadcast assistant Strain: Not on file  Food Insecurity: No Food Insecurity   Worried About Charity fundraiser in the Last Year: Never true   Arboriculturist in the Last Year: Never true  Transportation Needs: No Transportation Needs   Lack of Transportation (Medical): No   Lack of Transportation (Non-Medical): No  Physical Activity: Not on file  Stress: Not on file  Social Connections: Not on file     Vital Signs: There were no vitals taken for this visit.  Physical Exam Vitals and nursing note reviewed.  Constitutional:      Appearance: He is well-developed.  HENT:     Head: Normocephalic.  Pulmonary:     Effort: Pulmonary effort is normal.  Abdominal:     Comments: Positive transgluteal drain to gravity bag. Site is unremarkable with no erythema, edema, tenderness, bleeding or drainage noted at exit site. Suture and stat lock in place. Dressing is clean dry and intact. 80 ml of  feculant mal odorous  fluid noted in the gravity bag.   Musculoskeletal:        General: Normal range of motion.     Cervical  back: Normal range of motion.  Skin:    General: Skin is dry.  Neurological:     Mental Status: He is alert and oriented to person, place, and time.    Imaging: No results found.  Labs:  CBC: Recent Labs    04/05/21 0343 04/07/21 0330 04/09/21 0304 04/11/21 0240  WBC 20.7* 17.1* 14.7* 10.0  HGB 9.9* 9.7* 9.5* 9.0*  HCT 31.3* 30.0* 29.5* 28.7*  PLT 428* 422* 441* 501*    COAGS: Recent Labs    03/20/21 0313 04/03/21 0319  INR 1.3* 1.2    BMP: Recent Labs  07/04/20 0845 07/15/20 0048 08/13/20 7169 04/03/21 0319 04/07/21 0330 04/08/21 0446 04/11/21 0240  NA 138 136   < > 133* 133* 132* 135  K 3.8 3.7   < > 4.1 4.3 4.2 3.2*  CL 108 106   < > 101 101 99 103  CO2 25 21*   < > 27 26 28 26   GLUCOSE 105* 100*   < > 113* 113* 104* 101*  BUN 12 21   < > 16 14 14 15   CALCIUM 9.9 9.2   < > 8.8* 9.0 9.0 8.9  CREATININE 0.96 0.94   < > 0.58* 0.66 0.66 0.69  GFRNONAA >60 >60   < > >60 >60 >60 >60  GFRAA >60 >60  --   --   --   --   --    < > = values in this interval not displayed.    LIVER FUNCTION TESTS: Recent Labs    03/31/21 0501 04/03/21 0319 04/07/21 0330 04/08/21 0446  BILITOT 0.2* 0.4 0.2* 0.2*  AST 14* 19 19 19   ALT 18 22 22 22   ALKPHOS 99 91 97 97  PROT 6.8 6.8 7.2 7.2  ALBUMIN 2.0* 1.9* 2.1* 2.0*    Assessment:  70 y.o.  male outpatient. History of rectal cancer s/p LAR with end colostomy on 4.27.22. Complicated by multiple post op intra abdominal abscesses s/p intra abscess drains X 3 placed by IR . On 5.03.22 IR placed a 12 Fr left upper quadrant drain with aspiration of 160 ml of purulent fluid. Cultures grew enterococcus faecalis. Patient continued to have recurrent fevers and  on 5.12.22 IR placed a second abscess drain to a new right pericolic gutter fluid collection. 75 ml of purulent fluid was aspirated and again cultures grew enterococcus faecalis. On 5.26.22 IR placed a 3rd drain into a fluid collection in the pelvic cul-de-sac via left  trans gluteal approach. 100 ml of fluid aspiration and cultures for the third drain also grew enterococcus faecalis. Prior to discharge on 6.2.22 an abscessogram was performed . The right lower quadrant abscess was shown to be resolved and the drain was removed. The LUQ abscess drain was removed on 6.3.22 The patient presents to the drain clinic for further evaluation of the pelvic cul-de-sac (via a transgluteal approach) abscess drain.  Patient is being followed by Dr. Joyice Faster.  Mr. Zarrella presents today in the IR drain clinic for a pelvic cul-de-sac abscess drain follow up. He completed his course(s) of antibiotics prior to discharge from the hospital. He denies any fevers, abdominal pain, headache, SOB, cough, chest pain, nausea, vomiting or bleeding. He endorses mucus coming out of his rectum. He is flushing the drain 5 ml's daily and reports 90 ml's of output every 2 days. Approximately 80 ml of feculent malodorous noted to be int he gravity bag upon evaluation.  Suture and stat lock in place. Site is unremarkable with no erythema, edema, tenderness, bleeding or drainage noted at exit site. Dressing is clean dry and intact.    Subsequent imaging including Non con CT Abdomen pelvis obtained on 6.14.22 reviewed by Dr. Annamaria Boots. CT Shows a persistent pelvic cul-de-sac collection. Please see attestation from Dr. Annamaria Boots regarding findings and care plan.  Due to the persistent fluid collection the abscess drain will remain in place. Patient instructed to continue recording output and continue with gauze dressing changes every 1 to 2 days.  Order for additional saline flushed sent in to his pharmacy. Request Patient been seen by  Dr. Marcello Moores for additional evaluation. Once a determination has been made regarding the plan of care request the  Team/Patient notify IR for any additional drain follow up that may be needed. Mr. Docter verbalized understanding and is in agreement with the plan of care, He will be  contacting the surgery office to set up an appointment if he does not hear from them within the next week.    Signed: Jacqualine Mau, NP 04/22/2021, 12:32 PM   Please refer to Dr. Annamaria Boots attestation of this note for management and plan.

## 2021-04-23 DIAGNOSIS — Z433 Encounter for attention to colostomy: Secondary | ICD-10-CM | POA: Diagnosis not present

## 2021-04-23 DIAGNOSIS — C19 Malignant neoplasm of rectosigmoid junction: Secondary | ICD-10-CM | POA: Diagnosis not present

## 2021-04-25 DIAGNOSIS — C19 Malignant neoplasm of rectosigmoid junction: Secondary | ICD-10-CM | POA: Diagnosis not present

## 2021-04-25 DIAGNOSIS — K9189 Other postprocedural complications and disorders of digestive system: Secondary | ICD-10-CM | POA: Diagnosis not present

## 2021-04-25 DIAGNOSIS — Z9181 History of falling: Secondary | ICD-10-CM | POA: Diagnosis not present

## 2021-04-25 DIAGNOSIS — N179 Acute kidney failure, unspecified: Secondary | ICD-10-CM | POA: Diagnosis not present

## 2021-04-25 DIAGNOSIS — Z79891 Long term (current) use of opiate analgesic: Secondary | ICD-10-CM | POA: Diagnosis not present

## 2021-04-25 DIAGNOSIS — M179 Osteoarthritis of knee, unspecified: Secondary | ICD-10-CM | POA: Diagnosis not present

## 2021-04-25 DIAGNOSIS — Z87891 Personal history of nicotine dependence: Secondary | ICD-10-CM | POA: Diagnosis not present

## 2021-04-25 DIAGNOSIS — Z433 Encounter for attention to colostomy: Secondary | ICD-10-CM | POA: Diagnosis not present

## 2021-04-25 DIAGNOSIS — D630 Anemia in neoplastic disease: Secondary | ICD-10-CM | POA: Diagnosis not present

## 2021-05-01 ENCOUNTER — Telehealth: Payer: Self-pay

## 2021-05-01 ENCOUNTER — Other Ambulatory Visit: Payer: Medicare Other

## 2021-05-01 ENCOUNTER — Ambulatory Visit: Payer: Medicare Other | Admitting: Oncology

## 2021-05-01 NOTE — Telephone Encounter (Signed)
Nutrition  Notified by inpatient RD team as patient with complicated hospital course that impacted nutrition.   Called patient to introduce self and service at [facility].  No answer.  Left message with call back number.  Rhaelyn Giron B. Zenia Resides, [place], [place] Registered Dietitian [phone] (mobile)

## 2021-05-02 ENCOUNTER — Ambulatory Visit: Payer: Medicare Other

## 2021-05-02 NOTE — Progress Notes (Signed)
Nutrition Assessment   Reason for Assessment:  Referral from inpatient RD team   ASSESSMENT:   70 year old male with rectal cancer.  Patient s/p LAR with end colostomy on 03/05/21. Hospital course complicated with intrabdominal abscesses. Hospital notes reviewed and noted on TPN, Full liquid diet 5/17, soft diet on 5/19.  Per discharge on full liquid and puree diet due to hemorraghic esophagitis.  GI follow up to be done outpatient but patient declined.  Patient completed neoadjuvant chemotherapy.  Per medical oncology note not planning adjuvant treatment.    Patient returned RD's call from yesterday.  Spoke with patient via phone and introduced self.  Patient reports that appetite is getting better but still not back to like it was prior to surgery. Denies any trouble swallowing and said that he declined follow-up with GI.  Will see surgeon on Monday for post op follow-up.  Says that he is eating eggs, grits, bacon for breakfast. Drinking about 2 premier protein shakes per day. Ensure upset stomach.  Snacks during the day and then has evening meal.  Usually a meat and vegetable that wife prepares.  Still has drain in that causes pain.  Reports output from colostomy. Takes miralax because taking pain medication and has gotten constipation in the past.     Medications: zofran,  Oxycodone, protonix, compazine  Labs: reviewed   Anthropometrics:   Height: 70 inches Weight: 175 lb 7.8 oz on 5/30 (hospital weight) 184 lb on 4/20 191 lb on 2/16 BMI: 25  8% weight loss in the last 3 months   Estimated Energy Needs  Kcals: 2000-2400 Protein: 100-120 g Fluid: 2 L   NUTRITION DIAGNOSIS: Inadequate oral intake related to altered GI function as evidenced by intra-abdominal abscesses, decreased appetite, 8% weight loss in the last 3 months    INTERVENTION:  Defer diet advancement/order to surgeon and GI.   Encouraged patient to continue with oral nutrition supplements Encouraged small  frequent min meals/snacks Discussed foods high in protein.  Contact information provided   MONITORING, EVALUATION, GOAL: weight trends, intake   Next Visit: phone call Wednesday, July 20  Crimson Dubberly B. Zenia Resides, [place], [place] Registered Dietitian [phone] (mobile)

## 2021-05-05 ENCOUNTER — Other Ambulatory Visit: Payer: Self-pay | Admitting: Oncology

## 2021-05-05 ENCOUNTER — Encounter: Payer: Self-pay | Admitting: Oncology

## 2021-05-05 ENCOUNTER — Other Ambulatory Visit (HOSPITAL_COMMUNITY): Payer: Self-pay | Admitting: General Surgery

## 2021-05-05 DIAGNOSIS — B370 Candidal stomatitis: Secondary | ICD-10-CM

## 2021-05-05 MED ORDER — HYDROCODONE-ACETAMINOPHEN 5-325 MG PO TABS: 1.0000 | ORAL_TABLET | Freq: Four times a day (QID) | ORAL | 0 refills | Status: DC | PRN

## 2021-05-05 MED ORDER — FLUCONAZOLE 200 MG PO TABS: 200.0000 mg | ORAL_TABLET | Freq: Every day | ORAL | 2 refills | Status: DC

## 2021-05-05 MED ORDER — OXYCODONE HCL 5 MG PO TABS: 5.0000 mg | ORAL_TABLET | Freq: Four times a day (QID) | ORAL | 0 refills | Status: DC | PRN

## 2021-05-05 MED ORDER — PANTOPRAZOLE SODIUM 40 MG PO TBEC: 40.0000 mg | DELAYED_RELEASE_TABLET | Freq: Two times a day (BID) | ORAL | 2 refills | Status: DC

## 2021-05-06 ENCOUNTER — Other Ambulatory Visit: Payer: Self-pay | Admitting: Family Medicine

## 2021-05-06 DIAGNOSIS — M170 Bilateral primary osteoarthritis of knee: Secondary | ICD-10-CM

## 2021-05-06 NOTE — Telephone Encounter (Signed)
Sent patient MyChart message that he will need appt for refill.

## 2021-05-07 ENCOUNTER — Other Ambulatory Visit: Payer: Self-pay | Admitting: Interventional Radiology

## 2021-05-07 ENCOUNTER — Ambulatory Visit
Admission: RE | Admit: 2021-05-07 | Discharge: 2021-05-07 | Disposition: A | Discharge status: Home / Self Care | Payer: Medicare Other | Source: Ambulatory Visit | Attending: Interventional Radiology | Admitting: Interventional Radiology

## 2021-05-07 ENCOUNTER — Other Ambulatory Visit: Payer: Self-pay

## 2021-05-07 DIAGNOSIS — Z978 Presence of other specified devices: Secondary | ICD-10-CM | POA: Diagnosis not present

## 2021-05-07 DIAGNOSIS — K6811 Postprocedural retroperitoneal abscess: Secondary | ICD-10-CM | POA: Diagnosis not present

## 2021-05-07 DIAGNOSIS — K651 Peritoneal abscess: Secondary | ICD-10-CM

## 2021-05-07 HISTORY — PX: IR RADIOLOGIST EVAL & MGMT: IMG5224

## 2021-05-07 NOTE — Progress Notes (Signed)
Chief Complaint: Patient was seen in consultation today for abscess drain care at the request of Ziyan Schoon K  Referring Physician(s): Dr. Leighton Ruff  History of Present Illness: Shane [name]. is a 70 y.o. male with a history of rectal cancer s/p LAR with end colostomy on 4.27.22. Complicated by multiple post op intra abdominal abscesses s/p perc abscess drains X 3 placed by IR . Only the left transgluteal drain remains in place.  Patient presents with c/o pain/pulling at the catheter entry site.     Past Medical History:  Diagnosis Date   Allergy    Arthritis    knee   Rectal cancer ([facility]) 08/2020    Past Surgical History:  Procedure Laterality Date   ESOPHAGOGASTRODUODENOSCOPY (EGD) WITH PROPOFOL N/A 03/14/2021   Procedure: ESOPHAGOGASTRODUODENOSCOPY (EGD) WITH PROPOFOL;  Surgeon: Lavena Bullion, DO;  Location: [facility];  Service: Gastroenterology;  Laterality: N/A;   IR IMAGING GUIDED PORT INSERTION  07/25/2020   IR RADIOLOGIST EVAL & MGMT  04/22/2021   IR RADIOLOGIST EVAL & MGMT  05/07/2021   IR SINUS/FIST TUBE CHK-NON GI  04/10/2021   IR SINUS/FIST TUBE CHK-NON GI  04/10/2021   IR US GUIDE BX ASP/DRAIN  03/11/2021   OSTOMY N/A 03/05/2021   Procedure: OSTOMY;  Surgeon: Leighton Ruff, MD;  Location: [facility];  Service: General;  Laterality: N/A;   WISDOM TOOTH EXTRACTION     XI ROBOTIC ASSISTED LOWER ANTERIOR RESECTION N/A 03/05/2021   Procedure: XI ROBOTIC ASSISTED LOWER ANTERIOR RESECTION WITH MOBILIZATION OF THE SPLENIC FLEXURE OF THE COLON AND END COLOSTOMY;  Surgeon: Leighton Ruff, MD;  Location: [facility];  Service: General;  Laterality: N/A;  6 HOURS    Allergies: Nsaids  Medications: Prior to Admission medications   Medication Sig Start Date End Date Taking? Authorizing Provider  Diclofenac Sodium 2 % SOLN Place 2 g onto the skin 2 (two) times daily. Patient taking differently: Place 2 g onto the skin daily as needed (pain). 06/30/19   Lyndal Pulley, DO  docusate sodium (COLACE) 100 MG capsule Take 100 mg by mouth daily as needed for mild constipation.     [provider]  fluconazole (DIFLUCAN) 200 MG tablet Take 1 tablet (200 mg total) by mouth daily. 7/37/10   Leighton Ruff, MD  HYDROcodone-acetaminophen (NORCO/VICODIN) 5-325 MG tablet Take 1 tablet by mouth every 6 (six) hours as needed for moderate pain. 05/05/21   Ladell Pier, MD  ondansetron (ZOFRAN) 8 MG tablet Take 1 tablet (8 mg total) by mouth every 8 (eight) hours as needed for nausea or vomiting. Start 72 hours after each IV chemotherapy treatment 07/30/20   Ladell Pier, MD  oxyCODONE (OXY IR/ROXICODONE) 5 MG immediate release tablet Take 1-2 tablets (5-10 mg total) by mouth every 6 (six) hours as needed for severe pain. 05/04/93   Leighton Ruff, MD  pantoprazole (PROTONIX) 40 MG tablet Take 1 tablet (40 mg total) by mouth 2 (two) times daily before a meal. 8/54/62   Leighton Ruff, MD  pravastatin (PRAVACHOL) 20 MG tablet TAKE 1 TABLET(20 MG) BY MOUTH DAILY Patient taking differently: Take 20 mg by mouth daily. 11/12/20   Marrian Salvage, FNP  prochlorperazine (COMPAZINE) 10 MG tablet Take 1 tablet (10 mg total) by mouth every 6 (six) hours as needed for nausea. 07/30/20   Ladell Pier, MD  sodium chloride 0.9 % injection Inject 10 mLs into the vein daily. 04/22/21 05/22/21  Jacqualine Mau, NP  Family History  Problem Relation Age of Onset   Healthy Mother    Hypertension Father    Stroke Father    Colon cancer Neg Hx    Esophageal cancer Neg Hx    Stomach cancer Neg Hx    Rectal cancer Neg Hx     Social History   Socioeconomic History   Marital status: Married    Spouse name: Not on file   Number of children: 2   Years of education: 14   Highest education level: Not on file  Occupational History   Occupation: Armed forces technical officer    Comment: retired  Tobacco Use   Smoking status: Former    Packs/day: 1.00    Years:  10.00    Pack years: 10.00    Types: Cigarettes    Quit date: 08/09/1978    Years since quitting: 42.7   Smokeless tobacco: Never  Vaping Use   Vaping Use: Never used  Substance and Sexual Activity   Alcohol use: No    Alcohol/week: 0.0 standard drinks   Drug use: No   Sexual activity: Not Currently  Other Topics Concern   Not on file  Social History Narrative   Fun: [name] out with his grandkids, 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   Social Determinants of Radio broadcast assistant Strain: Not on file  Food Insecurity: No Food Insecurity   Worried About Charity fundraiser in the Last Year: Never true   Arboriculturist in the Last Year: Never true  Transportation Needs: No Transportation Needs   Lack of Transportation (Medical): No   Lack of Transportation (Non-Medical): No  Physical Activity: Not on file  Stress: Not on file  Social Connections: Not on file     Review of Systems: A 12 point ROS discussed and pertinent positives are indicated in the HPI above.  All other systems are negative.  Review of Systems  Vital Signs: There were no vitals taken for this visit.  Physical Exam Constitutional:      General: Shane [name] is not in acute distress.    Appearance: Normal appearance.  HENT:     Head: Normocephalic and atraumatic.  Eyes:     General: No scleral icterus. Pulmonary:     Effort: Pulmonary effort is normal.  Abdominal:     General: Abdomen is flat. There is no distension.     Palpations: Abdomen is soft.     Tenderness: There is no abdominal tenderness.  Musculoskeletal:       Back:  Skin:    General: Skin is warm and dry.  Neurological:     Mental Status: Shane [name] is alert and oriented to person, place, and time.  Psychiatric:        Mood and Affect: Mood normal.        Behavior: Behavior normal.      Imaging: CT ABDOMEN PELVIS WO CONTRAST  Result Date: 04/10/2021 CLINICAL DATA:  History of rectal cancer, post LAR with end colostomy complicated by development of  postoperative abscesses post ultrasound-guided placement of left upper quadrant percutaneous drainage catheter 03/12/2021, CT guided right lower quadrant percutaneous drainage catheter placement 03/20/2021 and left transgluteal drain placement on 04/03/21. EXAM: CT ABDOMEN AND PELVIS WITHOUT CONTRAST TECHNIQUE: Multidetector CT imaging of the abdomen and pelvis was performed following the standard protocol without IV contrast. COMPARISON:  Most recent prior CT scan of the abdomen and pelvis 03/30/2021 FINDINGS: Lower chest: Small bilateral layering pleural effusions with associated atelectasis. Otherwise, no  acute abnormality. Hepatobiliary: Decreasing size of probable intrahepatic abscess along the posterior aspect of the right hemi liver at 4.0 x 4.1 cm compared to 4.9 x 4.9 cm. Pancreas: Unremarkable. No pancreatic ductal dilatation or surrounding inflammatory changes. Spleen: Normal in size without focal abnormality. Adrenals/Urinary Tract: Unremarkable adrenal glands. No hydronephrosis or nephrolithiasis. Right lower pole renal cystic lesions, similar compared to prior. Stomach/Bowel: Surgical changes of low anterior resection with left lower quadrant diverting colostomy. No evidence of bowel obstruction. No focal bowel wall thickening. Vascular/Lymphatic: Limited evaluation in the absence of intravenous contrast. Aortic atherosclerotic calcifications. No suspicious lymphadenopathy. Reproductive: Prostate is unremarkable.  Large bilateral hydroceles. Other: Left upper quadrant percutaneous drainage catheter remains in unchanged position. A few locules of gas are present. No residual undrained fluid. Surgical Blake's drain via right lower quadrant which extends into the anatomic pelvis, unchanged position. Image guided right lower quadrant drainage catheter in stable position. No residual undrained fluid or abscess. Left transgluteal approach percutaneous drainage catheter in good position. Persistent but decreased  fluid collection. Precise measurement of the residual fluid collection is challenging to measure in the absence of intravenous contrast. Fluid collection measures approximately 6.5 x 3.4 cm compared to 8.7 x 5.9 cm previously. Musculoskeletal: No acute or significant osseous findings. L5-S1 degenerative disc disease. IMPRESSION: 1. Persistent but improving fluid and gas collection in the pelvic cul-de-sac with left transgluteal drainage catheter in good position. 2. Resolved right lower quadrant fluid collection with persistent drainage catheter. 3. Resolved left upper quadrant fluid collection with persistent drainage catheter. 4. Decreasing size of right intrahepatic fluid collection now measuring 4.0 x 4.1 cm compared to 4.9 x 4.9 cm previously. Likely resolving intrahepatic abscess. 5. No new or undrained fluid collections identified. 6. Small bilateral pleural effusions with associated atelectasis. 7. Additional ancillary findings as above. Electronically Signed   By: Jacqulynn Cadet M.D.   On: 04/10/2021 12:39   CT ABDOMEN PELVIS W CONTRAST  Result Date: 04/22/2021 CLINICAL DATA:  Follow-up abdominal and pelvic abscesses. Previous chemotherapy and radiation therapy for rectal carcinoma. EXAM: CT ABDOMEN AND PELVIS WITH CONTRAST TECHNIQUE: Multidetector CT imaging of the abdomen and pelvis was performed using the standard protocol following bolus administration of intravenous contrast. CONTRAST:  11mL ISOVUE-300 IOPAMIDOL (ISOVUE-300) INJECTION 61% COMPARISON:  04/10/2021 FINDINGS: Lower Chest: Tiny bilateral pleural effusions and mild bibasilar atelectasis show improvement since previous study. Hepatobiliary: No intrahepatic masses identified. Gallbladder is unremarkable. No evidence of biliary ductal dilatation. Two small rim enhancing fluid collections in the right posterior perihepatic space show interval decrease in size, largest measuring 4.0 cm in maximum diameter on image 17/2, compared to 4.4 cm  previously. Pancreas:  No mass or inflammatory changes. Spleen: Within normal limits in size and appearance. Adrenals/Urinary Tract: No masses identified. Stable small right renal cyst. No evidence of ureteral calculi or hydronephrosis. Stomach/Bowel: Left lower quadrant end colostomy again seen. No evidence bowel obstruction. Vascular/Lymphatic: No pathologically enlarged lymph nodes. No acute vascular findings. Aortic atherosclerotic calcification noted. Reproductive:  No mass or other significant abnormality. Other: Left transgluteal drainage catheter remains in appropriate position within rim enhancing gas and fluid collection in the presacral region. This collection measures 6.6 x 5.4 cm, without significant change compared to previous study. Left lateral abdominal wall percutaneous drainage catheter has been removed since previous study. Two rim enhancing fluid collections are seen along the left paracolic gutter, which measures 4.1 x 2.7 cm and 3.9 x 1.9 cm, both increased in size since previous study. Right lower  quadrant percutaneous drainage catheter has also been removed. A new 2.7 x 2.5 cm rim enhancing fluid collection is seen in the anterior right lower quadrant at previous drainage catheter site. Rim enhancing fluid collection in the subcutaneous tissues of right lower quadrant abdominal wall measures 6.0 x 2.1 cm, without significant change compared to prior study. Musculoskeletal:  No suspicious bone lesions identified. IMPRESSION: Two small rim enhancing fluid collections along the left paracolic gutter have increased in size following removal of percutaneous drainage catheter. Small rim enhancing fluid collection in anterior right lower quadrant has increased in size following percutaneous drainage catheter removal. Stable size of presacral gas and fluid collection, with transgluteal drainage catheter in appropriate position. Stable size of fluid collection in subcutaneous tissues of right lower  quadrant abdominal wall. Decreased size of 2 small right posterior perihepatic fluid collections. Decreased tiny bilateral pleural effusions and bibasilar atelectasis. Aortic Atherosclerosis (ICD10-I70.0). Electronically Signed   By: Marlaine Hind M.D.   On: 04/22/2021 16:32   IR Sinus/Fist Tube Chk-Non GI  Result Date: 04/10/2021 INDICATION: 70 year old with history of rectal cancer and status post surgery. History of intra-abdominal abscesses and status post percutaneous drains. No significant fluid around the left upper abdominal drain or the right lower abdominal drain on recent CT. EXAM: Drain injections x 2 Removal of right lower quadrant drain COMPARISON:  None. MEDICATIONS: None CONTRAST:  20 mL Omnipaque 300 FLUOROSCOPY TIME:  54 seconds, 6 mGy ANESTHESIA/SEDATION: None COMPLICATIONS: None immediate. TECHNIQUE: Patient was placed supine on the interventional table. PROCEDURE: Contrast was injected through the right lower quadrant drain. The contrast was aspirated from the right lower quadrant drain at the end of the procedure. This drain was cut and removed without complication. Contrast was injected through the left upper quadrant drain. Contrast and a small amount of yellow opaque fluid was aspirated from this drain at the end of the procedure. FINDINGS: Small collection around the right lower quadrant. No evidence for a bowel fistula. Based on these findings, the right lower quadrant drain was removed. Irregular collection along the left upper abdomen that extends caudal into the left lower quadrant. There is no evidence for a bowel fistula. IMPRESSION: 1. Small decompressed collection around the right lower quadrant drain without a bowel fistula. Right lower quadrant drain was removed. 2. Irregular collection around the left upper quadrant drain that extends along the left lateral abdominal cavity. No bowel fistula in this area. Drain was kept in place because there is still a small amount of yellow  opaque fluid coming from the drain. Electronically Signed   By: Markus Daft M.D.   On: 04/10/2021 17:04   IR Sinus/Fist Tube Chk-Non GI  Result Date: 04/10/2021 INDICATION: 70 year old with history of rectal cancer and status post surgery. History of intra-abdominal abscesses and status post percutaneous drains. No significant fluid around the left upper abdominal drain or the right lower abdominal drain on recent CT. EXAM: Drain injections x 2 Removal of right lower quadrant drain COMPARISON:  None. MEDICATIONS: None CONTRAST:  20 mL Omnipaque 300 FLUOROSCOPY TIME:  54 seconds, 6 mGy ANESTHESIA/SEDATION: None COMPLICATIONS: None immediate. TECHNIQUE: Patient was placed supine on the interventional table. PROCEDURE: Contrast was injected through the right lower quadrant drain. The contrast was aspirated from the right lower quadrant drain at the end of the procedure. This drain was cut and removed without complication. Contrast was injected through the left upper quadrant drain. Contrast and a small amount of yellow opaque fluid was aspirated from this  drain at the end of the procedure. FINDINGS: Small collection around the right lower quadrant. No evidence for a bowel fistula. Based on these findings, the right lower quadrant drain was removed. Irregular collection along the left upper abdomen that extends caudal into the left lower quadrant. There is no evidence for a bowel fistula. IMPRESSION: 1. Small decompressed collection around the right lower quadrant drain without a bowel fistula. Right lower quadrant drain was removed. 2. Irregular collection around the left upper quadrant drain that extends along the left lateral abdominal cavity. No bowel fistula in this area. Drain was kept in place because there is still a small amount of yellow opaque fluid coming from the drain. Electronically Signed   By: Markus Daft M.D.   On: 04/10/2021 17:04   IR Radiologist Eval & Mgmt  Result Date: 05/07/2021 Please refer  to notes tab for details about interventional procedure. (Op Note)  IR Radiologist Eval & Mgmt  Result Date: 04/22/2021 Please refer to notes tab for details about interventional procedure. (Op Note)   Labs:  CBC: Recent Labs    04/05/21 0343 04/07/21 0330 04/09/21 0304 04/11/21 0240  WBC 20.7* 17.1* 14.7* 10.0  HGB 9.9* 9.7* 9.5* 9.0*  HCT 31.3* 30.0* 29.5* 28.7*  PLT 428* 422* 441* 501*    COAGS: Recent Labs    03/20/21 0313 04/03/21 0319  INR 1.3* 1.2    BMP: Recent Labs    07/04/20 0845 07/15/20 0048 08/13/20 0906 04/03/21 0319 04/07/21 0330 04/08/21 0446 04/11/21 0240  NA 138 136   < > 133* 133* 132* 135  K 3.8 3.7   < > 4.1 4.3 4.2 3.2*  CL 108 106   < > 101 101 99 103  CO2 25 21*   < > 27 26 28 26   GLUCOSE 105* 100*   < > 113* 113* 104* 101*  BUN 12 21   < > 16 14 14 15   CALCIUM 9.9 9.2   < > 8.8* 9.0 9.0 8.9  CREATININE 0.96 0.94   < > 0.58* 0.66 0.66 0.69  GFRNONAA >60 >60   < > >60 >60 >60 >60  GFRAA >60 >60  --   --   --   --   --    < > = values in this interval not displayed.    LIVER FUNCTION TESTS: Recent Labs    03/31/21 0501 04/03/21 0319 04/07/21 0330 04/08/21 0446  BILITOT 0.2* 0.4 0.2* 0.2*  AST 14* 19 19 19   ALT 18 22 22 22   ALKPHOS 99 91 97 97  PROT 6.8 6.8 7.2 7.2  ALBUMIN 2.0* 1.9* 2.1* 2.0*    TUMOR MARKERS: No results for input(s): AFPTM, CEA, CA199, CHROMGRNA in the last 8760 hours.  Assessment and Plan:  70 year old male with pain associated with his left transgluteal drainage catheter.  There was scab and debris buildup at the catheter insertion site which appeared to be attached to the overlying dressing resulting in a pulling sensation with movement.  The drain entry site was freed from this debris and scab and redressed.    Electronically Signed: Criselda Peaches 05/07/2021, 4:17 PM   I spent a total of  5 Minutes in face to face in clinical consultation, greater than 50% of which was  counseling/coordinating care for left transgluteal abscess drain care

## 2021-05-09 ENCOUNTER — Inpatient Hospital Stay: Payer: Medicare Other

## 2021-05-09 ENCOUNTER — Inpatient Hospital Stay: Payer: Medicare Other | Admitting: Oncology

## 2021-05-09 ENCOUNTER — Inpatient Hospital Stay: Payer: Medicare Other | Attending: Physician Assistant

## 2021-05-09 ENCOUNTER — Other Ambulatory Visit: Payer: Self-pay

## 2021-05-09 VITALS — BP 138/69 | HR 105 | Temp 98.5°F | Resp 18 | Ht 70.0 in | Wt 151.2 lb

## 2021-05-09 DIAGNOSIS — C20 Malignant neoplasm of rectum: Secondary | ICD-10-CM

## 2021-05-09 DIAGNOSIS — Z79891 Long term (current) use of opiate analgesic: Secondary | ICD-10-CM | POA: Diagnosis not present

## 2021-05-09 DIAGNOSIS — Z87891 Personal history of nicotine dependence: Secondary | ICD-10-CM | POA: Diagnosis not present

## 2021-05-09 DIAGNOSIS — N179 Acute kidney failure, unspecified: Secondary | ICD-10-CM | POA: Diagnosis not present

## 2021-05-09 DIAGNOSIS — Z433 Encounter for attention to colostomy: Secondary | ICD-10-CM | POA: Diagnosis not present

## 2021-05-09 DIAGNOSIS — C19 Malignant neoplasm of rectosigmoid junction: Secondary | ICD-10-CM | POA: Diagnosis not present

## 2021-05-09 DIAGNOSIS — D630 Anemia in neoplastic disease: Secondary | ICD-10-CM | POA: Diagnosis not present

## 2021-05-09 DIAGNOSIS — M179 Osteoarthritis of knee, unspecified: Secondary | ICD-10-CM | POA: Diagnosis not present

## 2021-05-09 DIAGNOSIS — Z9181 History of falling: Secondary | ICD-10-CM | POA: Diagnosis not present

## 2021-05-09 DIAGNOSIS — K9189 Other postprocedural complications and disorders of digestive system: Secondary | ICD-10-CM | POA: Diagnosis not present

## 2021-05-09 DIAGNOSIS — Z95828 Presence of other vascular implants and grafts: Secondary | ICD-10-CM

## 2021-05-09 LAB — CEA (ACCESS): CEA (CHCC): 1.36 ng/mL (ref 0.00–5.00)

## 2021-05-09 MED ORDER — HEPARIN SOD (PORK) LOCK FLUSH 100 UNIT/ML IV SOLN
500.0000 [IU] | Freq: Once | INTRAVENOUS | Status: AC
  Administered 2021-05-09: 500 [IU]
  Filled 2021-05-09: qty 5

## 2021-05-09 MED ORDER — SODIUM CHLORIDE 0.9% FLUSH
10.0000 mL | Freq: Once | INTRAVENOUS | Status: AC
  Administered 2021-05-09: 10 mL
  Filled 2021-05-09: qty 10

## 2021-05-09 NOTE — Progress Notes (Signed)
Between OFFICE PROGRESS NOTE   Diagnosis: Rectal cancer  INTERVAL HISTORY:   Shane [name] returns for a scheduled visit.  He underwent a robotic assisted low anterior resection and end colostomy by Dr. Marcello Moores on 03/05/2021.  The postoperative course was complicated by development of multiple abdominal abscesses requiring drainage and an intensive care unit stay.  He also developed hemorrhagic esophagitis.  He was discharged home on 04/11/2021.  He continues to have a transgluteal drain in place.  He is followed by Dr. Marcello Moores.  He reports pain at the drain site and "rectum ".  He continues to have anorexia.  He was started on Diflucan this week for thrush.  He was able to take a trip to [place] recently.   The pathology revealed residual invasive adenocarcinoma with tumor directly invading the sigmoid colon,pT4b.  No lymphovascular or perineural invasion.  The resection margins are negative.  There was a treatment score of 2-partial response.  No loss of mismatch repair protein expression. Objective:  Vital signs in last 24 hours:  Blood pressure 138/69, pulse (!) 105, temperature 98.5 F (36.9 C), temperature source Oral, resp. rate 18, height $RemoveBe'5\' 10"'vQJgfyuCv$  (1.778 m), weight 151 lb 3.2 oz (68.6 kg), SpO2 100 %.   HEENT: Thrush over the tongue Resp: Mild end expiratory wheeze at the left posterior base, no respiratory distress Cardio: Regular rhythm, tachycardia GI: No hepatosplenomegaly, left lower quadrant colostomy, left gluteal drain with a gauze dressing Vascular: No leg edema    Portacath/PICC-without erythema  Lab Results:  Lab Results  Component Value Date   WBC 10.0 04/11/2021   HGB 9.0 (L) 04/11/2021   HCT 28.7 (L) 04/11/2021   MCV 84.4 04/11/2021   PLT 501 (H) 04/11/2021   NEUTROABS 9.3 (H) 03/11/2021    CMP  Lab Results  Component Value Date   NA 135 04/11/2021   K 3.2 (L) 04/11/2021   CL 103 04/11/2021   CO2 26 04/11/2021   GLUCOSE 101 (H)  04/11/2021   BUN 15 04/11/2021   CREATININE 0.69 04/11/2021   CALCIUM 8.9 04/11/2021   PROT 7.2 04/08/2021   ALBUMIN 2.0 (L) 04/08/2021   AST 19 04/08/2021   ALT 22 04/08/2021   ALKPHOS 97 04/08/2021   BILITOT 0.2 (L) 04/08/2021   GFRNONAA >60 04/11/2021   GFRAA >60 07/15/2020    Lab Results  Component Value Date   CEA1 12.99 (H) 01/10/2021   CEA 05/09/2021: 1.36 Imaging:  IR Radiologist Eval & Mgmt  Result Date: 05/07/2021 Please refer to notes tab for details about interventional procedure. (Op Note)   Medications: I have reviewed the patient's current medications.   Assessment/Plan: Rectal cancer CT abdomen/pelvis 06/27/2020-irregular masslike thickening of the rectum, increased colonic stool, perirectal infiltration into the presacral space, tiny bibasilar pulmonary nodules CT chest 07/12/2020-multiple small pulmonary nodules, largest 3 mm in the left apex MRI pelvis 07/12/2020-tumor at 11.4 centimeters from the anal verge, 6.6 cm from the internal sphincter, tumor extends through the right lateral rectal wall and directly involves an adjacent loop of distal sigmoid colon with invasion of anterior peritoneal reflection and involvement of the right seminal vesicle, no lymphadenopathy.  T4N0 Colonoscopy 07/17/2020-mass at 12 cm from the anal verge, completely obstructing mass at 23 cm from the anal verge, large internal hemorrhoids, biopsy confirmed adenocarcinoma Cycle 1 FOLFOX 07/30/2020 Cycle 2 FOLFOX 08/13/2020 Cycle 3 FOLFOX 08/27/2020 Cycle 4 FOLFOX 09/09/2020 Cycle 5 FOLFOX 09/24/2020, 5-FU bolus held, oxaliplatin dose reduced secondary to neutropenia Cycle 6  FOLFOX 10/07/2020, 5-FU bolus and full dose oxaliplatin, Neulasta Cycle 7 FOLFOX 10/21/2020, Neulasta CTs 10/31/2020-no change in rectal mass, no evidence of nodal metastases, unchanged nonspecific lung nodules Cycle 8 FOLFOX 11/04/2020 Radiation/Xeloda  11/27/2020-01/03/2021 Low anterior resection 03/05/2021-residual  moderately differentiated adenocarcinoma, treatment response score 2, tumor extends to the sigmoid colon, 26 negative lymph nodes, [id number], negative resection margins, no loss of mismatch repair protein expression, MSI stable   2.  Iron deficiency anemia secondary to #1 3.  Hemorrhoids 4.  Rectal pain secondary to hemorrhoids versus the primary rectal tumor 5.  Constipation secondary to #1, improved with a stool softener and laxative regimen 6.  Small indeterminate pulmonary nodules 7.  Arthritis 8.  Neutropenia secondary to chemotherapy 9.  Oxaliplatin neuropathy-mild loss of vibratory sense on exam 09/24/2020, 10/07/2020, 10/21/2020, 11/04/2020    Disposition: Shane [name] underwent a low anterior resection on 03/05/2021.  He had a complicated and lengthy operative recovery.  He continues to have a pelvic drain in place and is followed by Dr. Marcello Moores.  I reviewed the surgical pathology report.  He had a T4 lesion with negative surgical margins and negative lymph nodes.  Shane [name] completed total neoadjuvant therapy.  I do not recommend adjuvant therapy.  The CEA has normalized.  He will keep the Port-A-Cath in place for now.  He will return for a Port-A-Cath flush in 8 weeks.  He will be seen for an office visit, Port-A-Cath flush, and CEA in 16 weeks.  Shane [name] will continue follow-up with Dr. Marcello Moores for management of the pelvic drain and postoperative care.  Betsy Coder, MD  05/09/2021  10:48 AM

## 2021-05-20 ENCOUNTER — Other Ambulatory Visit: Payer: Self-pay

## 2021-05-20 ENCOUNTER — Ambulatory Visit (INDEPENDENT_AMBULATORY_CARE_PROVIDER_SITE_OTHER): Payer: Medicare Other | Admitting: Family

## 2021-05-20 ENCOUNTER — Encounter: Payer: Self-pay | Admitting: Family

## 2021-05-20 ENCOUNTER — Other Ambulatory Visit (INDEPENDENT_AMBULATORY_CARE_PROVIDER_SITE_OTHER): Payer: Medicare Other

## 2021-05-20 VITALS — BP 112/60 | HR 106 | Temp 98.8°F | Ht 70.0 in | Wt 147.1 lb

## 2021-05-20 DIAGNOSIS — D649 Anemia, unspecified: Secondary | ICD-10-CM

## 2021-05-20 DIAGNOSIS — E559 Vitamin D deficiency, unspecified: Secondary | ICD-10-CM

## 2021-05-20 DIAGNOSIS — Z0000 Encounter for general adult medical examination without abnormal findings: Secondary | ICD-10-CM

## 2021-05-20 LAB — COMPREHENSIVE METABOLIC PANEL
ALT: 6 U/L (ref 0–53)
AST: 11 U/L (ref 0–37)
Albumin: 3.6 g/dL (ref 3.5–5.2)
Alkaline Phosphatase: 90 U/L (ref 39–117)
BUN: 14 mg/dL (ref 6–23)
CO2: 24 mEq/L (ref 19–32)
Calcium: 10.1 mg/dL (ref 8.4–10.5)
Chloride: 97 mEq/L (ref 96–112)
Creatinine, Ser: 0.91 mg/dL (ref 0.40–1.50)
GFR: 84.76 mL/min (ref >60.00)
Glucose, Bld: 114 mg/dL — ABNORMAL HIGH (ref 70–99)
Potassium: 4.2 mEq/L (ref 3.5–5.1)
Sodium: 132 mEq/L — ABNORMAL LOW (ref 135–145)
Total Bilirubin: 0.6 mg/dL (ref 0.2–1.2)
Total Protein: 8 g/dL (ref 6.0–8.3)

## 2021-05-20 LAB — CBC WITH DIFFERENTIAL/PLATELET
Basophils Absolute: 0.1 K/uL (ref 0.0–0.1)
Basophils Relative: 1 % (ref 0.0–3.0)
Eosinophils Absolute: 0 K/uL (ref 0.0–0.7)
Eosinophils Relative: 0.1 % (ref 0.0–5.0)
HCT: 31.5 % — ABNORMAL LOW (ref 39.0–52.0)
Hemoglobin: 10.3 g/dL — ABNORMAL LOW (ref 13.0–17.0)
Lymphocytes Relative: 8.2 % — ABNORMAL LOW (ref 12.0–46.0)
Lymphs Abs: 0.7 K/uL (ref 0.7–4.0)
MCHC: 32.8 g/dL (ref 30.0–36.0)
MCV: 76.9 fl — ABNORMAL LOW (ref 78.0–100.0)
Monocytes Absolute: 1 K/uL (ref 0.1–1.0)
Monocytes Relative: 12.5 % — ABNORMAL HIGH (ref 3.0–12.0)
Neutro Abs: 6.5 K/uL (ref 1.4–7.7)
Neutrophils Relative %: 78.2 % — ABNORMAL HIGH (ref 43.0–77.0)
Platelets: 478 K/uL — ABNORMAL HIGH (ref 150.0–400.0)
RBC: 4.1 Mil/uL — ABNORMAL LOW (ref 4.22–5.81)
RDW: 19.2 % — ABNORMAL HIGH (ref 11.5–15.5)
WBC: 8.3 K/uL (ref 4.0–10.5)

## 2021-05-20 LAB — TSH: TSH: 2.44 u[IU]/mL (ref 0.35–5.50)

## 2021-05-20 LAB — FERRITIN: Ferritin: 425.1 ng/mL — ABNORMAL HIGH (ref 22.0–322.0)

## 2021-05-20 LAB — VITAMIN B12: Vitamin B-12: 468 pg/mL (ref 211–911)

## 2021-05-20 LAB — VITAMIN D 25 HYDROXY (VIT D DEFICIENCY, FRACTURES): VITD: 17.77 ng/mL — ABNORMAL LOW (ref 30.00–100.00)

## 2021-05-20 NOTE — Progress Notes (Signed)
Shane [name]. is a 70 y.o. 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   Hyperglycemia 03/14/2021   Hypernatremia 03/13/2021   Acute upper GI bleeding 03/13/2021   Enterococcal sepsis ([place]) 03/13/2021   Hypokalemia 03/12/2021   Malnutrition of moderate degree 03/11/2021   Anemia    Hypotension    Tachypnea 03/09/2021   Positive D dimer 03/09/2021   Acute respiratory distress    Ileus ([place])    AKI (acute kidney injury) ([place]) 03/08/2021   Hemorrhoids, internal 03/08/2021   Hypercholesteremia 03/08/2021   Pulmonary nodules 03/08/2021   Hypoxia 03/08/2021   Phimosis of penis s/p dilitation 03/05/2021 03/08/2021   Delirium 03/07/2021   Port-A-Cath in place 12/10/2020   Rectal carcinoma ([place]) 07/18/2020   Diarrhea 07/09/2020   Iron deficiency anemia 07/04/2020   Goals of care, counseling/discussion 07/04/2020   S/P carpal tunnel release 01/30/2020   Right carpal tunnel syndrome 09/19/2018   Osteoarthritis of spine with radiculopathy, 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 60 tablet 0   oxyCODONE (OXY IR/ROXICODONE) 5 MG immediate release tablet Take 1-2 tablets (5-10 mg total) by mouth every 6 (six) hours as needed for severe pain. 30 tablet 0   pantoprazole (PROTONIX) 40 MG tablet Take 1 tablet (40 mg total) by mouth 2 (two) times daily before a meal. 60 tablet 2   polyethylene glycol powder  (GLYCOLAX/MIRALAX) 17 GM/SCOOP powder Take 1 Container by mouth once.     pravastatin (PRAVACHOL) 20 MG tablet TAKE 1 TABLET(20 MG) BY MOUTH DAILY (Patient taking differently: Take 20 mg by mouth daily.) 90 tablet 3   Diclofenac Sodium 2 % SOLN Place 2 g onto the skin 2 (two) times daily. (Patient taking differently: Place 2 g onto the skin daily as needed (pain).) 112 g 3   docusate sodium (COLACE) 100 MG capsule Take 100 mg by mouth daily as needed for mild constipation.  (Patient not taking: No sig reported)     fluconazole (DIFLUCAN) 200 MG tablet Take 1 tablet (200 mg total) by mouth daily. (Patient not taking: Reported on 05/20/2021) 3 tablet 2   ondansetron (ZOFRAN) 8 MG tablet Take 1 tablet (8 mg total) by mouth every 8 (eight) hours as needed for nausea or vomiting. Start 72 hours after each IV chemotherapy treatment (Patient not taking: No sig reported) 30 tablet 1   prochlorperazine (COMPAZINE) 10 MG tablet Take 1 tablet (10 mg total) by mouth every 6 (six) hours as needed for nausea. (Patient not taking: No sig reported) 60 tablet 1   sodium chloride 0.9 % injection Inject 10 mLs into the vein daily. (Patient not taking: Reported on 05/20/2021) 300 mL 3   No current facility-administered medications for this visit.    Allergies: Nsaids  Past Medical History:  Diagnosis Date   Allergy    Arthritis    knee   Rectal cancer ([place]) 08/2020    Past Surgical History:  Procedure Laterality Date  ESOPHAGOGASTRODUODENOSCOPY (EGD) WITH PROPOFOL N/A 03/14/2021   Procedure: ESOPHAGOGASTRODUODENOSCOPY (EGD) WITH PROPOFOL;  Surgeon: Lavena Bullion, DO;  Location: [facility];  Service: Gastroenterology;  Laterality: N/A;   IR IMAGING GUIDED PORT INSERTION  07/25/2020   IR RADIOLOGIST EVAL & MGMT  04/22/2021   IR RADIOLOGIST EVAL & MGMT  05/07/2021   IR SINUS/FIST TUBE CHK-NON GI  04/10/2021   IR SINUS/FIST TUBE CHK-NON GI  04/10/2021   IR US GUIDE BX ASP/DRAIN  03/11/2021   OSTOMY N/A 03/05/2021    Procedure: OSTOMY;  Surgeon: Leighton Ruff, MD;  Location: [facility];  Service: General;  Laterality: N/A;   WISDOM TOOTH EXTRACTION     XI ROBOTIC ASSISTED LOWER ANTERIOR RESECTION N/A 03/05/2021   Procedure: XI ROBOTIC ASSISTED LOWER ANTERIOR RESECTION WITH MOBILIZATION OF THE SPLENIC FLEXURE OF THE COLON AND END COLOSTOMY;  Surgeon: Leighton Ruff, MD;  Location: [facility];  Service: General;  Laterality: N/A;  6 HOURS    Family History  Problem Relation Age of Onset   Healthy Mother    Hypertension Father    Stroke Father    Colon cancer Neg Hx    Esophageal cancer Neg Hx    Stomach cancer Neg Hx    Rectal cancer Neg Hx     Social History   Tobacco Use   Smoking status: Former    Packs/day: 1.00    Years: 10.00    Pack years: 10.00    Types: Cigarettes    Quit date: 08/09/1978    Years since quitting: 42.8   Smokeless tobacco: Never  Substance Use Topics   Alcohol use: No    Alcohol/week: 0.0 standard drinks    Subjective:   Accompanied by her wife; would like CPE;  Being treated for rectal cancer and secondary complications from infection;  Would like to get his Vitamin D level checked- needs to work on getting his appetite back in order to get his muscle mass improved to help get over infection;   Review of Systems  Constitutional:  Positive for malaise/fatigue and weight loss.  HENT: Negative.    Eyes: Negative.   Respiratory: Negative.    Cardiovascular: Negative.   Gastrointestinal: Negative.   Genitourinary: Negative.   Musculoskeletal: Negative.   Skin: Negative.   Neurological:  Positive for weakness.  Endo/Heme/Allergies: Negative.   Psychiatric/Behavioral: Negative.      Objective:  Vitals:   05/20/21 1132  BP: 112/60  Pulse: (!) 106  Temp: 98.8 F (37.1 C)  TempSrc: Oral  SpO2: 99%  Weight: 147 lb 1.6 oz (66.7 kg)  Height: $Remove'5\' 10"'SzSgfiv$  (1.778 m)    General: Well developed, well nourished, in no acute distress  Skin : Warm and dry.  Head:  Normocephalic and atraumatic  Eyes: Sclera and conjunctiva clear; pupils round and reactive to light; extraocular movements intact  Ears: External normal; canals clear; tympanic membranes normal  Oropharynx: Pink, supple. No suspicious lesions  Neck: Supple without thyromegaly, adenopathy  Lungs: Respirations unlabored; clear to auscultation bilaterally without wheeze, rales, rhonchi  CVS exam: normal rate and regular rhythm.  Neurologic: Alert and oriented; speech intact; face symmetrical; moves all extremities well; CNII-XII intact without focal deficit   Assessment:  1. PE (physical exam), annual   2. Vitamin D deficiency   3. Anemia, unspecified type     Plan:  Age appropriate preventive healthcare needs addressed; encouraged to try drinking smoothies to help get weight back on/ discuss option with nutrition; will update labs and refills  as needed today; follow-up to be determined;  This visit occurred during the SARS-CoV-2 public health emergency.  Safety protocols were in place, including screening questions prior to the visit, additional usage of staff PPE, and extensive cleaning of exam room while observing appropriate contact time as indicated for disinfecting solutions.     No follow-ups on file.  Orders Placed This Encounter  Procedures   B12    Standing Status:   Future    Number of Occurrences:   1    Standing Expiration Date:   05/20/2022   CBC with Differential/Platelet    Standing Status:   Future    Number of Occurrences:   1    Standing Expiration Date:   05/20/2022   Comp Met (CMET)    Standing Status:   Future    Number of Occurrences:   1    Standing Expiration Date:   05/20/2022   Ferritin    Standing Status:   Future    Number of Occurrences:   1    Standing Expiration Date:   05/20/2022   TSH    Standing Status:   Future    Number of Occurrences:   1    Standing Expiration Date:   05/20/2022   Vitamin D (25 hydroxy)    Standing Status:   Future     Number of Occurrences:   1    Standing Expiration Date:   05/20/2022    Requested Prescriptions    No prescriptions requested or ordered in this encounter

## 2021-05-23 ENCOUNTER — Other Ambulatory Visit: Payer: Self-pay | Admitting: Family

## 2021-05-23 ENCOUNTER — Other Ambulatory Visit: Payer: Self-pay | Admitting: Oncology

## 2021-05-23 MED ORDER — VITAMIN D (ERGOCALCIFEROL) 1.25 MG (50000 UNIT) PO CAPS: 50000.0000 [IU] | ORAL_CAPSULE | ORAL | 0 refills | Status: AC

## 2021-05-23 NOTE — Telephone Encounter (Signed)
Per Dr. Benay Spice: Naproxen needs to be filled by PCP

## 2021-05-26 ENCOUNTER — Other Ambulatory Visit (HOSPITAL_COMMUNITY): Payer: Self-pay

## 2021-05-26 ENCOUNTER — Encounter: Payer: Self-pay | Admitting: Oncology

## 2021-05-26 MED ORDER — NORMAL SALINE FLUSH 0.9 % IV SOLN
INTRAVENOUS | 1 refills | Status: DC
  Filled 2021-05-26: qty 300, 30d supply, fill #0

## 2021-05-27 ENCOUNTER — Other Ambulatory Visit: Payer: Self-pay | Admitting: Nurse Practitioner

## 2021-05-27 DIAGNOSIS — C20 Malignant neoplasm of rectum: Secondary | ICD-10-CM

## 2021-05-27 MED ORDER — HYDROCODONE-ACETAMINOPHEN 5-325 MG PO TABS: 1.0000 | ORAL_TABLET | Freq: Four times a day (QID) | ORAL | 0 refills | Status: DC | PRN

## 2021-05-28 ENCOUNTER — Inpatient Hospital Stay: Payer: Medicare Other | Attending: Oncology

## 2021-05-28 NOTE — Progress Notes (Signed)
Nutrition  Called patient for nutrition follow-up.  No answer. Left message with call back number  Cardell Rachel B. Suliman Termini, RD, LDN Registered Dietitian [phone] (mobile)  

## 2021-06-05 ENCOUNTER — Encounter: Payer: Self-pay | Admitting: General Practice

## 2021-06-05 DIAGNOSIS — C19 Malignant neoplasm of rectosigmoid junction: Secondary | ICD-10-CM | POA: Diagnosis not present

## 2021-06-05 DIAGNOSIS — Z433 Encounter for attention to colostomy: Secondary | ICD-10-CM | POA: Diagnosis not present

## 2021-06-05 DIAGNOSIS — Z87891 Personal history of nicotine dependence: Secondary | ICD-10-CM | POA: Diagnosis not present

## 2021-06-05 DIAGNOSIS — M179 Osteoarthritis of knee, unspecified: Secondary | ICD-10-CM | POA: Diagnosis not present

## 2021-06-05 DIAGNOSIS — K9189 Other postprocedural complications and disorders of digestive system: Secondary | ICD-10-CM | POA: Diagnosis not present

## 2021-06-05 DIAGNOSIS — Z9181 History of falling: Secondary | ICD-10-CM | POA: Diagnosis not present

## 2021-06-05 DIAGNOSIS — D630 Anemia in neoplastic disease: Secondary | ICD-10-CM | POA: Diagnosis not present

## 2021-06-05 DIAGNOSIS — Z79891 Long term (current) use of opiate analgesic: Secondary | ICD-10-CM | POA: Diagnosis not present

## 2021-06-05 DIAGNOSIS — N179 Acute kidney failure, unspecified: Secondary | ICD-10-CM | POA: Diagnosis not present

## 2021-06-05 NOTE — Progress Notes (Addendum)
Mr. Naugle left a vm stating he received medical bills that he is having difficulty paying.  I sent Edwyna Shell a message to reach out to Mr Hogrefe. I spoke with Mr Golan and he states the bill is for medication he received from his mail away specialty pharmacy.  I encouraged him to reach to the pharmacy and his insurance company.

## 2021-06-05 NOTE — Progress Notes (Signed)
[place] CSW Progress Notes  Called patient at request of Rosine Abe, Therapist, sports.  He is concerned about a bill he received recently for "3 shots that I got last year, I did not have any idea how much they would cost."  Had COBRA coverage at that time, is now on Medicare.  CSW does not have any resources to assist, directed him to call billing and discuss this concern.  Edwyna Shell, LCSW Clinical Social Worker Phone:  [phone]

## 2021-06-10 DIAGNOSIS — N179 Acute kidney failure, unspecified: Secondary | ICD-10-CM | POA: Diagnosis not present

## 2021-06-10 DIAGNOSIS — D630 Anemia in neoplastic disease: Secondary | ICD-10-CM | POA: Diagnosis not present

## 2021-06-10 DIAGNOSIS — C19 Malignant neoplasm of rectosigmoid junction: Secondary | ICD-10-CM | POA: Diagnosis not present

## 2021-06-10 DIAGNOSIS — K9189 Other postprocedural complications and disorders of digestive system: Secondary | ICD-10-CM | POA: Diagnosis not present

## 2021-06-10 DIAGNOSIS — Z79891 Long term (current) use of opiate analgesic: Secondary | ICD-10-CM | POA: Diagnosis not present

## 2021-06-10 DIAGNOSIS — M179 Osteoarthritis of knee, unspecified: Secondary | ICD-10-CM | POA: Diagnosis not present

## 2021-06-10 DIAGNOSIS — Z87891 Personal history of nicotine dependence: Secondary | ICD-10-CM | POA: Diagnosis not present

## 2021-06-10 DIAGNOSIS — Z9181 History of falling: Secondary | ICD-10-CM | POA: Diagnosis not present

## 2021-06-10 DIAGNOSIS — Z433 Encounter for attention to colostomy: Secondary | ICD-10-CM | POA: Diagnosis not present

## 2021-06-20 ENCOUNTER — Other Ambulatory Visit: Payer: Self-pay | Admitting: Nurse Practitioner

## 2021-06-20 ENCOUNTER — Other Ambulatory Visit: Payer: Self-pay | Admitting: *Deleted

## 2021-06-20 ENCOUNTER — Encounter: Payer: Self-pay | Admitting: Oncology

## 2021-06-20 DIAGNOSIS — C20 Malignant neoplasm of rectum: Secondary | ICD-10-CM

## 2021-06-20 MED ORDER — NAPROXEN 500 MG PO TABS: 500.0000 mg | ORAL_TABLET | Freq: Two times a day (BID) | ORAL | 0 refills | Status: DC | PRN

## 2021-06-20 MED ORDER — HYDROCODONE-ACETAMINOPHEN 5-325 MG PO TABS: 1.0000 | ORAL_TABLET | Freq: Four times a day (QID) | ORAL | 0 refills | Status: DC | PRN

## 2021-06-20 NOTE — Progress Notes (Signed)
Patient requesting refill on naproxen. OK per NP.

## 2021-07-04 ENCOUNTER — Other Ambulatory Visit: Payer: Self-pay

## 2021-07-04 ENCOUNTER — Inpatient Hospital Stay: Payer: Medicare Other | Attending: Physician Assistant

## 2021-07-04 VITALS — BP 144/67 | HR 71 | Temp 98.3°F | Resp 20

## 2021-07-04 DIAGNOSIS — C20 Malignant neoplasm of rectum: Secondary | ICD-10-CM | POA: Diagnosis not present

## 2021-07-04 DIAGNOSIS — Z95828 Presence of other vascular implants and grafts: Secondary | ICD-10-CM

## 2021-07-04 DIAGNOSIS — Z452 Encounter for adjustment and management of vascular access device: Secondary | ICD-10-CM | POA: Diagnosis not present

## 2021-07-04 MED ORDER — SODIUM CHLORIDE 0.9% FLUSH
10.0000 mL | Freq: Once | INTRAVENOUS | Status: AC
  Administered 2021-07-04: 10 mL via INTRAVENOUS

## 2021-07-04 MED ORDER — HEPARIN SOD (PORK) LOCK FLUSH 100 UNIT/ML IV SOLN
500.0000 [IU] | Freq: Once | INTRAVENOUS | Status: AC
  Administered 2021-07-04: 500 [IU] via INTRAVENOUS

## 2021-07-10 ENCOUNTER — Encounter: Payer: Self-pay | Admitting: Family

## 2021-07-10 ENCOUNTER — Encounter: Payer: Self-pay | Admitting: Oncology

## 2021-07-10 ENCOUNTER — Ambulatory Visit: Payer: Medicare Other

## 2021-07-10 NOTE — Progress Notes (Signed)
Nutrition Follow-up:   Patient with rectal cancer. S/p LAR with end colostomy on 03/05/21. Hospital course complicated.  Patient completed neoadjuvant treatment and no adjuvant treatment planned at this time.   Called for nutrition follow-up.  Patient reports that his appetite has improved since being out of the hospital and has gained about 15 lbs back.  Continues to drink premier protein shakes.  Denies any nutrition questions or concerns at this time.  NUTRITION DIAGNOSIS: Inadequate oral intake improved   INTERVENTION:  Patient continue to eat well balanced diet to promote weight gain and improve nutritional status RD available as needed    NEXT VISIT: no follow-up RD available as needed  Shane [name] B. Zenia Resides, [place], [place] Registered Dietitian [phone] (mobile)

## 2021-07-11 ENCOUNTER — Telehealth: Payer: Self-pay

## 2021-07-11 ENCOUNTER — Other Ambulatory Visit: Payer: Self-pay

## 2021-07-11 ENCOUNTER — Ambulatory Visit (HOSPITAL_COMMUNITY)
Admission: RE | Admit: 2021-07-11 | Discharge: 2021-07-11 | Disposition: A | Discharge status: Home / Self Care | Payer: Medicare Other | Source: Ambulatory Visit | Attending: Family | Admitting: Family

## 2021-07-11 ENCOUNTER — Encounter: Payer: Self-pay | Admitting: Family

## 2021-07-11 ENCOUNTER — Other Ambulatory Visit: Payer: Self-pay | Admitting: Family

## 2021-07-11 ENCOUNTER — Ambulatory Visit (INDEPENDENT_AMBULATORY_CARE_PROVIDER_SITE_OTHER): Payer: Medicare Other | Admitting: Family

## 2021-07-11 VITALS — BP 152/60 | HR 90 | Temp 98.5°F | Ht 70.0 in | Wt 162.2 lb

## 2021-07-11 DIAGNOSIS — R600 Localized edema: Secondary | ICD-10-CM | POA: Diagnosis not present

## 2021-07-11 DIAGNOSIS — M25572 Pain in left ankle and joints of left foot: Secondary | ICD-10-CM

## 2021-07-11 LAB — BRAIN NATRIURETIC PEPTIDE: Pro B Natriuretic peptide (BNP): 115 pg/mL — ABNORMAL HIGH (ref 0.0–100.0)

## 2021-07-11 LAB — COMPREHENSIVE METABOLIC PANEL
ALT: 24 U/L (ref 0–53)
AST: 21 U/L (ref 0–37)
Albumin: 3.6 g/dL (ref 3.5–5.2)
Alkaline Phosphatase: 107 U/L (ref 39–117)
BUN: 22 mg/dL (ref 6–23)
CO2: 28 mEq/L (ref 19–32)
Calcium: 9.8 mg/dL (ref 8.4–10.5)
Chloride: 105 mEq/L (ref 96–112)
Creatinine, Ser: 0.76 mg/dL (ref 0.40–1.50)
GFR: 90.3 mL/min (ref >60.00)
Glucose, Bld: 98 mg/dL (ref 70–99)
Potassium: 4 mEq/L (ref 3.5–5.1)
Sodium: 139 mEq/L (ref 135–145)
Total Bilirubin: 0.5 mg/dL (ref 0.2–1.2)
Total Protein: 7 g/dL (ref 6.0–8.3)

## 2021-07-11 LAB — URIC ACID: Uric Acid, Serum: 4.4 mg/dL (ref 4.0–7.8)

## 2021-07-11 MED ORDER — FUROSEMIDE 20 MG PO TABS
20.0000 mg | ORAL_TABLET | Freq: Every day | ORAL | 0 refills | Status: DC | PRN
Start: 2021-07-11 — End: 2021-07-11

## 2021-07-11 NOTE — Telephone Encounter (Signed)
Received My chart message in reference to Pt having swelling bilateral ankles and legs. And stated his toes are numb. Pt also notified PCP appointment was scheduled with PCP

## 2021-07-11 NOTE — Progress Notes (Signed)
Shane [name]. is a 70 y.o. 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   Hyperglycemia 03/14/2021   Hypernatremia 03/13/2021   Acute upper GI bleeding 03/13/2021   Enterococcal sepsis ([place]) 03/13/2021   Hypokalemia 03/12/2021   Malnutrition of moderate degree 03/11/2021   Anemia    Hypotension    Tachypnea 03/09/2021   Positive D dimer 03/09/2021   Acute respiratory distress    Ileus ([place])    AKI (acute kidney injury) ([place]) 03/08/2021   Hemorrhoids, internal 03/08/2021   Hypercholesteremia 03/08/2021   Pulmonary nodules 03/08/2021   Hypoxia 03/08/2021   Phimosis of penis s/p dilitation 03/05/2021 03/08/2021   Delirium 03/07/2021   Port-A-Cath in place 12/10/2020   Rectal carcinoma ([place]) 07/18/2020   Diarrhea 07/09/2020   Iron deficiency anemia 07/04/2020   Goals of care, counseling/discussion 07/04/2020   S/P carpal tunnel release 01/30/2020   Right carpal tunnel syndrome 09/19/2018   Osteoarthritis of spine with radiculopathy, 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 (Patient taking differently: Place 2 g onto the skin daily as needed (pain).) 112 g 3   furosemide (LASIX) 20 MG tablet Take 1 tablet (20 mg total) by mouth daily as needed for edema. 30 tablet 0   HYDROcodone-acetaminophen (NORCO/VICODIN) 5-325 MG tablet Take 1 tablet by mouth every 6 (six) hours as needed for moderate pain. 60 tablet 0   naproxen (NAPROSYN) 500 MG tablet Take 1 tablet (500 mg  total) by mouth 2 (two) times daily as needed for moderate pain. 60 tablet 0   polyethylene glycol powder (GLYCOLAX/MIRALAX) 17 GM/SCOOP powder Take 1 Container by mouth once.     pravastatin (PRAVACHOL) 20 MG tablet TAKE 1 TABLET(20 MG) BY MOUTH DAILY (Patient taking differently: Take 20 mg by mouth daily.) 90 tablet 3   Vitamin D, Ergocalciferol, (DRISDOL) 1.25 MG (50000 UNIT) CAPS capsule Take 1 capsule (50,000 Units total) by mouth every 7 (seven) days for 12 doses. 12 capsule 0   docusate sodium (COLACE) 100 MG capsule Take 100 mg by mouth daily as needed for mild constipation. (Patient not taking: Reported on 07/11/2021)     ondansetron (ZOFRAN) 8 MG tablet Take 1 tablet (8 mg total) by mouth every 8 (eight) hours as needed for nausea or vomiting. Start 72 hours after each IV chemotherapy treatment (Patient not taking: No sig reported) 30 tablet 1   oxyCODONE (OXY IR/ROXICODONE) 5 MG immediate release tablet Take 1-2 tablets (5-10 mg total) by mouth every 6 (six) hours as needed for severe pain. (Patient not taking: Reported on 07/11/2021) 30 tablet 0   pantoprazole (PROTONIX) 40 MG tablet Take 1 tablet (40 mg total) by mouth 2 (two) times daily before a meal. (Patient not taking: No sig reported) 60 tablet 2   prochlorperazine (COMPAZINE) 10 MG tablet Take 1 tablet (10 mg total) by mouth every 6 (six) hours as needed for nausea. (Patient not taking: No sig reported) 60 tablet 1   Sodium Chloride Flush (NORMAL  SALINE FLUSH) 0.9 % SOLN Flush once daily (Patient not taking: No sig reported) 300 mL 1   No current facility-administered medications for this visit.    Allergies: Nsaids  Past Medical History:  Diagnosis Date   Allergy    Arthritis    knee   Rectal cancer ([place]) 08/2020    Past Surgical History:  Procedure Laterality Date   ESOPHAGOGASTRODUODENOSCOPY (EGD) WITH PROPOFOL N/A 03/14/2021   Procedure: ESOPHAGOGASTRODUODENOSCOPY (EGD) WITH PROPOFOL;  Surgeon: Lavena Bullion, DO;   [facility];  Service: Gastroenterology;  Laterality: N/A;   IR IMAGING GUIDED PORT INSERTION  07/25/2020   IR RADIOLOGIST EVAL & MGMT  04/22/2021   IR RADIOLOGIST EVAL & MGMT  05/07/2021   IR SINUS/FIST TUBE CHK-NON GI  04/10/2021   IR SINUS/FIST TUBE CHK-NON GI  04/10/2021   IR US GUIDE BX ASP/DRAIN  03/11/2021   OSTOMY N/A 03/05/2021   Procedure: OSTOMY;  Surgeon: Leighton Ruff, MD;  Location[facility];  Service: General;  Laterality: N/A;   WISDOM TOOTH EXTRACTION     XI ROBOTIC ASSISTED LOWER ANTERIOR RESECTION N/A 03/05/2021   Procedure: XI ROBOTIC ASSISTED LOWER ANTERIOR RESECTION WITH MOBILIZATION OF THE SPLENIC FLEXURE OF THE COLON AND END COLOSTOMY;  Surgeon: Leighton Ruff, MD;  Location: [facility];  Service: General;  Laterality: N/A;  6 HOURS    Family History  Problem Relation Age of Onset   Healthy Mother    Hypertension Father    Stroke Father    Colon cancer Neg Hx    Esophageal cancer Neg Hx    Stomach cancer Neg Hx    Rectal cancer Neg Hx     Social History   Tobacco Use   Smoking status: Former    Packs/day: 1.00    Years: 10.00    Pack years: 10.00    Types: Cigarettes    Quit date: 08/09/1978    Years since quitting: 42.9   Smokeless tobacco: Never  Substance Use Topics   Alcohol use: No    Alcohol/week: 0.0 standard drinks    Subjective:  Swelling/ numbness in lower extremities- left greater than right x 1 month; more noticeable recently- swelling is persisting x 1 week; Notes that appetite is much improved and feeling stronger; still working with surgeon regarding drain; Has been released by oncology; No chest pain or shortness of breath; no wheezing noted;     Objective:  Vitals:   07/11/21 1107  BP: (!) 152/60  Pulse: 90  Temp: 98.5 F (36.9 C)  TempSrc: Oral  SpO2: 99%  Weight: 162 lb 3.2 oz (73.6 kg)  Height: _0  (1.778 m)    General: Well developed, well nourished, in no acute distress  Skin : Warm and dry.  Head: Normocephalic  and atraumatic  Eyes: Sclera and conjunctiva clear; pupils round and reactive to light; extraocular movements intact  Ears: External normal; canals clear; tympanic membranes normal  Oropharynx: Pink, supple. No suspicious lesions  Neck: Supple without thyromegaly, adenopathy  Lungs: Respirations unlabored; clear to auscultation bilaterally without wheeze, rales, rhonchi  CVS exam: normal rate and regular rhythm.  Musculoskeletal: No deformities; no active joint inflammation  Extremities: bilateral edema- left greater than right, cyanosis, clubbing  Vessels: Symmetric bilaterally  Neurologic: Alert and oriented; speech intact; face symmetrical; moves all extremities well; CNII-XII intact without focal deficit   Assessment:  1. Pedal edema   2. Acute left ankle pain     Plan:  Due to history of cancer,  need to rule out DVT; stat doppler scheduled for later today; update labs today; trial of Lasix 20 mg qd prn swelling; follow up to be determined;  This visit occurred during the SARS-CoV-2 public health emergency.  Safety protocols were in place, including screening questions prior to the visit, additional usage of staff PPE, and extensive cleaning of exam room while observing appropriate contact time as indicated for disinfecting solutions.    No follow-ups on file.  Orders Placed This Encounter  Procedures   Uric acid   Brain natriuretic peptide   Comp Met (CMET)    Requested Prescriptions   Signed Prescriptions Disp Refills   furosemide (LASIX) 20 MG tablet 30 tablet 0    Sig: Take 1 tablet (20 mg total) by mouth daily as needed for edema.

## 2021-07-11 NOTE — Telephone Encounter (Signed)
I have called pt and informed him that it would be a good idea to get seen today due his sx. Pt has scheduled to come in today at 11am today.

## 2021-07-15 ENCOUNTER — Encounter: Payer: Self-pay | Admitting: Family

## 2021-07-15 ENCOUNTER — Other Ambulatory Visit: Payer: Self-pay | Admitting: Family

## 2021-07-15 DIAGNOSIS — R600 Localized edema: Secondary | ICD-10-CM

## 2021-07-15 DIAGNOSIS — R7989 Other specified abnormal findings of blood chemistry: Secondary | ICD-10-CM

## 2021-07-16 ENCOUNTER — Encounter: Payer: Self-pay | Admitting: Oncology

## 2021-07-17 ENCOUNTER — Other Ambulatory Visit: Payer: Self-pay | Admitting: Nurse Practitioner

## 2021-07-17 DIAGNOSIS — C20 Malignant neoplasm of rectum: Secondary | ICD-10-CM

## 2021-07-18 ENCOUNTER — Other Ambulatory Visit: Payer: Self-pay | Admitting: Nurse Practitioner

## 2021-07-18 DIAGNOSIS — C20 Malignant neoplasm of rectum: Secondary | ICD-10-CM

## 2021-07-18 MED ORDER — HYDROCODONE-ACETAMINOPHEN 5-325 MG PO TABS: 1.0000 | ORAL_TABLET | Freq: Four times a day (QID) | ORAL | 0 refills | Status: DC | PRN

## 2021-07-21 ENCOUNTER — Other Ambulatory Visit: Payer: Self-pay | Admitting: *Deleted

## 2021-07-21 MED ORDER — NAPROXEN 500 MG PO TABS: 500.0000 mg | ORAL_TABLET | Freq: Two times a day (BID) | ORAL | 0 refills | Status: DC | PRN

## 2021-07-23 ENCOUNTER — Other Ambulatory Visit: Payer: Self-pay | Admitting: General Surgery

## 2021-07-23 DIAGNOSIS — C20 Malignant neoplasm of rectum: Secondary | ICD-10-CM

## 2021-07-23 DIAGNOSIS — K651 Peritoneal abscess: Secondary | ICD-10-CM

## 2021-07-24 ENCOUNTER — Ambulatory Visit
Admission: RE | Admit: 2021-07-24 | Discharge: 2021-07-24 | Disposition: A | Discharge status: Home / Self Care | Payer: Medicare Other | Source: Ambulatory Visit | Attending: General Surgery | Admitting: General Surgery

## 2021-07-24 ENCOUNTER — Encounter: Payer: Self-pay | Admitting: Radiology

## 2021-07-24 DIAGNOSIS — C20 Malignant neoplasm of rectum: Secondary | ICD-10-CM

## 2021-07-24 DIAGNOSIS — Z85048 Personal history of other malignant neoplasm of rectum, rectosigmoid junction, and anus: Secondary | ICD-10-CM | POA: Diagnosis not present

## 2021-07-24 DIAGNOSIS — K6389 Other specified diseases of intestine: Secondary | ICD-10-CM | POA: Diagnosis not present

## 2021-07-24 DIAGNOSIS — N281 Cyst of kidney, acquired: Secondary | ICD-10-CM | POA: Diagnosis not present

## 2021-07-24 DIAGNOSIS — K651 Peritoneal abscess: Secondary | ICD-10-CM

## 2021-07-24 DIAGNOSIS — Z4682 Encounter for fitting and adjustment of non-vascular catheter: Secondary | ICD-10-CM | POA: Diagnosis not present

## 2021-07-24 DIAGNOSIS — K6811 Postprocedural retroperitoneal abscess: Secondary | ICD-10-CM | POA: Diagnosis not present

## 2021-07-24 HISTORY — PX: IR RADIOLOGIST EVAL & MGMT: IMG5224

## 2021-07-24 MED ORDER — IOPAMIDOL (ISOVUE-300) INJECTION 61%
100.0000 mL | Freq: Once | INTRAVENOUS | Status: AC | PRN
  Administered 2021-07-24: 100 mL via INTRAVENOUS

## 2021-07-24 NOTE — Progress Notes (Addendum)
Referring Physician(s): [name],Alicia  Chief Complaint: The patient is seen in follow up today s/p drains placed in IR for pelvic abscess. 5/3: LUQ drain--6/3 removed 5/12 RLQ drain--6/2 removed 5/26: left transgluteal drain-- still in place   History of present illness:  Rectal cancer with colostomy- post op abscesses Has come in today for CT and evaluation of pelvic abscess He is doing well Gaining weight Some discomfort with drain at times Flushes 10 cc daily OP is still significant ~50 cc daily OP is brown color  Otherwise well Eating better Colostomy intact Has seen Dr Joyice Faster just this week--- she knows he is to see IR today Will see pt again in follow up Oct 24.     Past Medical History:  Diagnosis Date   Allergy    Arthritis    knee   Rectal cancer ([place]) 08/2020    Past Surgical History:  Procedure Laterality Date   ESOPHAGOGASTRODUODENOSCOPY (EGD) WITH PROPOFOL N/A 03/14/2021   Procedure: ESOPHAGOGASTRODUODENOSCOPY (EGD) WITH PROPOFOL;  Surgeon: Lavena Bullion, DO;  Location: [facility];  Service: Gastroenterology;  Laterality: N/A;   IR IMAGING GUIDED PORT INSERTION  07/25/2020   IR RADIOLOGIST EVAL & MGMT  04/22/2021   IR RADIOLOGIST EVAL & MGMT  05/07/2021   IR SINUS/FIST TUBE CHK-NON GI  04/10/2021   IR SINUS/FIST TUBE CHK-NON GI  04/10/2021   IR US GUIDE BX ASP/DRAIN  03/11/2021   OSTOMY N/A 03/05/2021   Procedure: OSTOMY;  Surgeon: Leighton Ruff, MD;  Location: [facility];  Service: General;  Laterality: N/A;   WISDOM TOOTH EXTRACTION     XI ROBOTIC ASSISTED LOWER ANTERIOR RESECTION N/A 03/05/2021   Procedure: XI ROBOTIC ASSISTED LOWER ANTERIOR RESECTION WITH MOBILIZATION OF THE SPLENIC FLEXURE OF THE COLON AND END COLOSTOMY;  Surgeon: Leighton Ruff, MD;  Location: [facility];  Service: General;  Laterality: N/A;  6 HOURS    Allergies: Nsaids  Medications: Prior to Admission medications   Medication Sig Start Date End Date Taking? Authorizing  Provider  Diclofenac Sodium 2 % SOLN Place 2 g onto the skin 2 (two) times daily. Patient taking differently: Place 2 g onto the skin daily as needed (pain). 06/30/19   Lyndal Pulley, DO  docusate sodium (COLACE) 100 MG capsule Take 100 mg by mouth daily as needed for mild constipation. Patient not taking: Reported on 07/11/2021    [provider]  furosemide (LASIX) 20 MG tablet TAKE 1 TABLET(20 MG) BY MOUTH DAILY AS NEEDED FOR SWELLING 07/11/21   Marrian Salvage, FNP  HYDROcodone-acetaminophen (NORCO/VICODIN) 5-325 MG tablet Take 1 tablet by mouth every 6 (six) hours as needed for moderate pain. 07/18/21   Owens Shark, NP  naproxen (NAPROSYN) 500 MG tablet Take 1 tablet (500 mg total) by mouth 2 (two) times daily as needed for moderate pain. 07/21/21   Owens Shark, NP  ondansetron (ZOFRAN) 8 MG tablet Take 1 tablet (8 mg total) by mouth every 8 (eight) hours as needed for nausea or vomiting. Start 72 hours after each IV chemotherapy treatment Patient not taking: No sig reported 07/30/20   Ladell Pier, MD  oxyCODONE (OXY IR/ROXICODONE) 5 MG immediate release tablet Take 1-2 tablets (5-10 mg total) by mouth every 6 (six) hours as needed for severe pain. Patient not taking: Reported on 07/11/2021 [id number]   Leighton Ruff, MD  pantoprazole (PROTONIX) 40 MG tablet Take 1 tablet (40 mg total) by mouth 2 (two) times daily before a meal. Patient not  taking: No sig reported [id number]   Leighton Ruff, MD  polyethylene glycol powder [facility]) 17 GM/SCOOP powder Take 1 Container by mouth once.    [provider]  pravastatin (PRAVACHOL) 20 MG tablet TAKE 1 TABLET(20 MG) BY MOUTH DAILY Patient taking differently: Take 20 mg by mouth daily. 11/12/20   Marrian Salvage, FNP  prochlorperazine (COMPAZINE) 10 MG tablet Take 1 tablet (10 mg total) by mouth every 6 (six) hours as needed for nausea. Patient not taking: No sig reported 07/30/20   Ladell Pier, MD  Sodium  Chloride Flush (NORMAL SALINE FLUSH) 0.9 % SOLN Flush once daily Patient not taking: No sig reported 05/26/21   Criselda Peaches, MD  Vitamin D, Ergocalciferol, (DRISDOL) 1.25 MG (50000 UNIT) CAPS capsule Take 1 capsule (50,000 Units total) by mouth every 7 (seven) days for 12 doses. 05/23/21 08/09/21  Marrian Salvage, FNP     Family History  Problem Relation Age of Onset   Healthy Mother    Hypertension Father    Stroke Father    Colon cancer Neg Hx    Esophageal cancer Neg Hx    Stomach cancer Neg Hx    Rectal cancer Neg Hx     Social History   Socioeconomic History   Marital status: Married    Spouse name: Not on file   Number of children: 2   Years of education: 14   Highest education level: Not on file  Occupational History   Occupation: Armed forces technical officer    Comment: retired  Tobacco Use   Smoking status: Former    Packs/day: 1.00    Years: 10.00    Pack years: 10.00    Types: Cigarettes    Quit date: 08/09/1978    Years since quitting: 42.9   Smokeless tobacco: Never  Vaping Use   Vaping Use: Never used  Substance and Sexual Activity   Alcohol use: No    Alcohol/week: 0.0 standard drinks   Drug use: No   Sexual activity: Not Currently  Other Topics Concern   Not on file  Social History Narrative   Fun: [name] out with his grandkids, 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   Social Determinants of Radio broadcast assistant Strain: Not on file  Food Insecurity: No Food Insecurity   Worried About Charity fundraiser in the Last Year: Never true   Arboriculturist in the Last Year: Never true  Transportation Needs: No Transportation Needs   Lack of Transportation (Medical): No   Lack of Transportation (Non-Medical): No  Physical Activity: Not on file  Stress: Not on file  Social Connections: Not on file     Vital Signs: There were no vitals taken for this visit.  Physical Exam Skin:    General: Skin is warm.     Comments: Site is clean and dry NT no  bleeding Scabbed onto dressing--- difficult to remove dressing but was able to  Drain injection shows some small communication with rectal stump per Dr Pascal Lux CT showing improvement in all collections Only small collection left at site of TG drain     Imaging: CT ABDOMEN PELVIS W CONTRAST  Result Date: 07/24/2021 CLINICAL DATA:  History of rectal cancer, post LAR with end colostomy and history of multiple postoperative and percutaneous drainage catheter (ultrasound-guided left upper abdominal percutaneous catheter placement on 03/12/2021; CT guided right lower quadrant per drainage catheter placement on 03/20/2021 and left trans gluteal post drainage catheter placement on 04/03/2021). Patient  presents to the interventional radiology drain clinic for drainage catheter evaluation and management. Patient continues to flush his solitary remaining left trans gluteal approach drainage catheter with 10 cc per day reporting output of approximately 50 cc per day. EXAM: CT ABDOMEN AND PELVIS WITH CONTRAST TECHNIQUE: Multidetector CT imaging of the abdomen and pelvis was performed using the standard protocol following bolus administration of intravenous contrast. CONTRAST:  144m ISOVUE-300 IOPAMIDOL (ISOVUE-300) INJECTION 61% COMPARISON:  CT abdomen pelvis-04/22/2021; 04/10/2021; 03/30/2021; 03/19/2021; 03/11/2021 Ultrasound guided left upper quadrant percutaneous catheter placement-03/11/2021 CT guided right lower quadrant percutaneous drainage catheter placement-03/20/2021 CT guided left trans gluteal approach percutaneous catheter placement-04/03/2021 FINDINGS: Lower chest: Limited visualization of the lower thorax demonstrates resolution of trace left-sided effusion with improved aeration of the bilateral lung bases. Minimal bibasilar subsegmental atelectasis, left greater than right. No new focal airspace opacities. Normal heart size.  No pericardial effusion. Hepatobiliary: Normal hepatic contour. Near  complete resolution of subcapsular abscess adjacent to the posterior aspect of the right lobe of the liver with tiny residual component now measuring 2.3 x 1.6 cm (image 13, series 2), previously, 4.0 x 2.7 cm. No discrete hepatic lesions. Normal appearance of the gallbladder which is again noted to be interposed adjacent to the posteroinferior aspect the right lobe of the liver. No ascites. Pancreas: Normal appearance of the pancreas. Spleen: Normal appearance of the spleen. Adrenals/Urinary Tract: There is symmetric enhancement of the bilateral kidneys. Unchanged appearance of the approximately 2.4 cm cyst within the inferior medial aspect of the right kidney (image 91, series 6). Additional subcentimeter hypoattenuating renal lesions are too small to accurately characterize though favored to represent additional renal cysts. No evidence of nephrolithiasis on this postcontrast examination. No urinary obstruction or perinephric stranding. Normal appearance the bilateral adrenal glands. Normal appearance of the urinary bladder given degree of distention. Stomach/Bowel: Stable sequela of low anterior resection with end colostomy creation within the left mid hemiabdomen. There is persistent mild patulous distension of a loop of small bowel within the left lower abdomen/pelvis, similar to the 04/22/2021 examination and without evidence of enteric obstruction. Moderate colonic stool burden. No significant hiatal hernia. No pneumoperitoneum, pneumatosis or portal venous gas. Stable positioning of left trans gluteal approach percutaneous drainage catheter with end coiled and locked within residual air and fluid containing presacral collection which measures approximately 5.1 x 4.1 x 4.1 cm (sagittal image 90, series 8; axial image 70, series 2, decreased in size compared to the 04/22/2021 examination, previously, 6.6 x 5.4 x 5.6 cm. There is a serpiginous communication between the presacral fluid collection and the rectal  stump (image 95, series 8). Tiny fluid collections along the left pericolic gutter and within the right lateral inferior ventral abdominal wall have both resolved in the interval. Vascular/Lymphatic: Moderate amount of mixed calcified and noncalcified atherosclerotic plaque within normal caliber abdominal aorta, not resulting in a hemodynamically significant stenosis. The major branch vessels of the abdominal aorta appear patent on this non CTA examination. No bulky retroperitoneal, mesenteric, pelvic or inguinal lymphadenopathy. Reproductive: Redemonstrated large bilateral hydroceles, the left measuring at least 10.7 x 7.8 cm (image 99, series 2) and the right measuring approximately 6.2 x 6.0 cm (image 89, series 2). Other: Minimal amount of subcutaneous edema about the midline of the low back. Musculoskeletal: No acute or aggressive osseous abnormalities. Mild-to-moderate multilevel lumbar spine DDD, worse at L5-S1 with disc space height loss, endplate irregularity and sclerosis. Mild-to-moderate degenerative change the bilateral hips with joint space loss, subchondral sclerosis osteophytosis. Small  os acetabuli are seen bilaterally. IMPRESSION: 1. Stable positioning of left trans gluteal approach percutaneous drainage catheter with slight reduction in size of residual air and fluid containing presacral collection, presently measuring 5.1 cm in maximal diameter, previously, 6.6 cm. The residual presacral collection appears to demonstrate serpiginous communication with the rectal stump. 2. Interval resolution of previously noted tiny fluid collections within the left pericolic gutter as well as the right ventral abdominal wall with near complete resolution of perihepatic collection with residual ill-defined component measuring 2.3 cm, previously, 4.0 cm. No new definable/drainable fluid collections within the abdomen or pelvis. 3. Stable sequela of low anterior resection and end colostomy creation without evidence  of enteric obstruction. 4. Redemonstrated large bilateral hydroceles, left greater than right. 5. Aortic Atherosclerosis (ICD10-I70.0). PLAN: Patient subsequently underwent percutaneous drainage catheter injection. Electronically Signed   By: Sandi Mariscal M.D.   On: 07/24/2021 13:57    Labs:  CBC: Recent Labs    04/07/21 0330 04/09/21 0304 04/11/21 0240 05/20/21 1350  WBC 17.1* 14.7* 10.0 8.3  HGB 9.7* 9.5* 9.0* 10.3*  HCT 30.0* 29.5* 28.7* 31.5*  PLT 422* 441* 501* 478.0*    COAGS: Recent Labs    03/20/21 0313 04/03/21 0319  INR 1.3* 1.2    BMP: Recent Labs    04/03/21 0319 04/07/21 0330 04/08/21 0446 04/11/21 0240 05/20/21 1350 07/11/21 1136  NA 133* 133* 132* 135 132* 139  K 4.1 4.3 4.2 3.2* 4.2 4.0  CL 101 101 99 103 97 105  CO2 '27 26 28 26 24 28  '$ GLUCOSE 113* 113* 104* 101* 114* 98  BUN '16 14 14 15 14 22  '$ CALCIUM 8.8* 9.0 9.0 8.9 10.1 9.8  CREATININE 0.58* 0.66 0.66 0.69 0.91 0.76  GFRNONAA >60 >60 >60 >60  --   --     LIVER FUNCTION TESTS: Recent Labs    04/07/21 0330 04/08/21 0446 05/20/21 1350 07/11/21 1136  BILITOT 0.2* 0.2* 0.6 0.5  AST '19 19 11 21  '$ ALT '22 22 6 24  '$ ALKPHOS 97 97 90 107  PROT 7.2 7.2 8.0 7.0  ALBUMIN 2.1* 2.0* 3.6 3.6    Assessment:  Rectal cancer with colostomy Post op abscesses IR drains x 3---- only L TG drain remains OP still brown color-- 50 cc daily CT showing great improvement in collections--- but small collection remains at TG drain  area per Dr Pascal Lux Rec:  since this tube is now 3.5 mo old-- pt to go to [facility] for drain exchange and upsize asap.  Scheduler to call pt with time and date Will change gravity bag to JP--- done today Keep appt with Dr Marcello Moores 09/01/21 Pt to have CT and drain injection in 3 weeks as OP  He leaves here with good understanding of plan   Signed: Lavonia Drafts, PA-C 07/24/2021, 2:05 PM   Please refer to Dr. Pascal Lux attestation of this note for management and plan.

## 2021-07-25 ENCOUNTER — Other Ambulatory Visit (HOSPITAL_COMMUNITY): Payer: Self-pay | Admitting: Interventional Radiology

## 2021-07-25 DIAGNOSIS — C20 Malignant neoplasm of rectum: Secondary | ICD-10-CM

## 2021-07-29 ENCOUNTER — Ambulatory Visit (HOSPITAL_COMMUNITY)
Admission: RE | Admit: 2021-07-29 | Discharge: 2021-07-29 | Disposition: A | Discharge status: Home / Self Care | Payer: Medicare Other | Source: Ambulatory Visit | Attending: Interventional Radiology | Admitting: Interventional Radiology

## 2021-07-29 DIAGNOSIS — Z4803 Encounter for change or removal of drains: Secondary | ICD-10-CM | POA: Diagnosis not present

## 2021-07-29 DIAGNOSIS — Z4682 Encounter for fitting and adjustment of non-vascular catheter: Secondary | ICD-10-CM | POA: Diagnosis not present

## 2021-07-29 DIAGNOSIS — C20 Malignant neoplasm of rectum: Secondary | ICD-10-CM

## 2021-07-29 HISTORY — PX: IR CATHETER TUBE CHANGE: IMG717

## 2021-07-29 MED ORDER — LIDOCAINE HCL 1 % IJ SOLN
INTRAMUSCULAR | Status: DC | PRN
  Administered 2021-07-29: 5 mL

## 2021-07-29 MED ORDER — IOHEXOL 300 MG/ML  SOLN
15.0000 mL | Freq: Once | INTRAMUSCULAR | Status: AC | PRN
  Administered 2021-07-29: 5 mL

## 2021-07-29 MED ORDER — LIDOCAINE HCL 1 % IJ SOLN
INTRAMUSCULAR | Status: AC
  Administered 2021-07-29: 5 mL
  Filled 2021-07-29: qty 20

## 2021-07-29 NOTE — Procedures (Signed)
Interventional Radiology Procedure Note  Procedure: LEFT TRANSGLUTEAL DRAIN EXCHG AND UPSIZE TO 12 FR    Complications: None  Estimated Blood Loss:  MIN  Findings: FULL REPORT IN PACS    Tamera Punt, MD

## 2021-08-05 DIAGNOSIS — Z933 Colostomy status: Secondary | ICD-10-CM | POA: Diagnosis not present

## 2021-08-05 DIAGNOSIS — C20 Malignant neoplasm of rectum: Secondary | ICD-10-CM | POA: Diagnosis not present

## 2021-08-13 ENCOUNTER — Encounter: Payer: Self-pay | Admitting: Oncology

## 2021-08-13 ENCOUNTER — Ambulatory Visit: Payer: Medicare Other | Admitting: Cardiovascular Disease

## 2021-08-15 ENCOUNTER — Encounter: Payer: Self-pay | Admitting: Oncology

## 2021-08-19 ENCOUNTER — Telehealth: Payer: Self-pay

## 2021-08-19 ENCOUNTER — Other Ambulatory Visit: Payer: Self-pay | Admitting: Nurse Practitioner

## 2021-08-19 DIAGNOSIS — C20 Malignant neoplasm of rectum: Secondary | ICD-10-CM

## 2021-08-19 NOTE — Telephone Encounter (Signed)
Per provider pt is to follow up with [name] d/t increased pain following drain placement pain medication refilled x 1 this office but pt is stating same complaint per provider please refer back to surgery for pain management  [name] made aware

## 2021-08-21 ENCOUNTER — Other Ambulatory Visit: Payer: Self-pay | Admitting: Nurse Practitioner

## 2021-08-21 DIAGNOSIS — C20 Malignant neoplasm of rectum: Secondary | ICD-10-CM

## 2021-09-03 ENCOUNTER — Other Ambulatory Visit: Payer: Self-pay | Admitting: General Surgery

## 2021-09-03 DIAGNOSIS — K651 Peritoneal abscess: Secondary | ICD-10-CM

## 2021-09-05 ENCOUNTER — Encounter: Payer: Self-pay | Admitting: Oncology

## 2021-09-05 ENCOUNTER — Inpatient Hospital Stay: Payer: Medicare Other

## 2021-09-05 ENCOUNTER — Inpatient Hospital Stay: Payer: Medicare Other | Attending: Physician Assistant

## 2021-09-05 ENCOUNTER — Other Ambulatory Visit (HOSPITAL_BASED_OUTPATIENT_CLINIC_OR_DEPARTMENT_OTHER): Payer: Self-pay

## 2021-09-05 ENCOUNTER — Ambulatory Visit: Payer: Medicare Other | Attending: Internal Medicine

## 2021-09-05 ENCOUNTER — Inpatient Hospital Stay: Payer: Medicare Other | Admitting: Oncology

## 2021-09-05 ENCOUNTER — Other Ambulatory Visit: Payer: Self-pay

## 2021-09-05 VITALS — BP 155/80 | HR 90 | Temp 98.7°F | Resp 18 | Ht 70.0 in | Wt 168.6 lb

## 2021-09-05 DIAGNOSIS — R102 Pelvic and perineal pain: Secondary | ICD-10-CM | POA: Insufficient documentation

## 2021-09-05 DIAGNOSIS — R918 Other nonspecific abnormal finding of lung field: Secondary | ICD-10-CM | POA: Insufficient documentation

## 2021-09-05 DIAGNOSIS — C20 Malignant neoplasm of rectum: Secondary | ICD-10-CM | POA: Insufficient documentation

## 2021-09-05 DIAGNOSIS — Z23 Encounter for immunization: Secondary | ICD-10-CM

## 2021-09-05 LAB — CEA (ACCESS): CEA (CHCC): 1.36 ng/mL (ref 0.00–5.00)

## 2021-09-05 MED ORDER — INFLUENZA VAC A&B SA ADJ QUAD 0.5 ML IM PRSY
PREFILLED_SYRINGE | INTRAMUSCULAR | 0 refills | Status: DC
  Filled 2021-09-05: qty 0.5, 1d supply, fill #0

## 2021-09-05 MED ORDER — PFIZER COVID-19 VAC BIVALENT 30 MCG/0.3ML IM SUSP
INTRAMUSCULAR | 0 refills | Status: DC
  Filled 2021-09-05: qty 0.3, 1d supply, fill #0

## 2021-09-05 MED ORDER — HYDROCODONE-ACETAMINOPHEN 5-325 MG PO TABS: 1.0000 | ORAL_TABLET | Freq: Four times a day (QID) | ORAL | 0 refills | Status: DC | PRN

## 2021-09-05 MED ORDER — NAPROXEN 500 MG PO TABS: 500.0000 mg | ORAL_TABLET | Freq: Two times a day (BID) | ORAL | 0 refills | Status: DC | PRN

## 2021-09-05 NOTE — Progress Notes (Signed)
[place] OFFICE PROGRESS NOTE   Diagnosis: Rectal cancer  INTERVAL HISTORY:   [name] returns as scheduled.  He reports an improved appetite and energy level.  A pelvic drain remains in place.  The drain was upsized on 07/29/2021.  He is scheduled for follow-up in radiology in approximately 2 weeks and hopes the drain will be removed.  He has pain related to the drain.  He takes hydrocodone and naproxen as needed.  Objective:  Vital signs in last 24 hours:  Blood pressure (!) 155/80, pulse 90, temperature 98.7 F (37.1 C), temperature source Oral, resp. rate 18, height _0  (1.778 m), weight 168 lb 9.6 oz (76.5 kg), SpO2 100 %.    Lymphatics: No cervical, supraclavicular, or inguinal nodes.  "Shotty "bilateral axillary nodes Resp: Lungs clear bilaterally Cardio: Regular rate and rhythm GI: No hepatosplenomegaly, left lower quadrant colostomy with brown stool, reducible right inguinal hernia Vascular: No leg edema  Skin: Healed decubitus ulcer, drain in place at the left sacrum  Portacath/PICC-without erythema  Lab Results:  Lab Results  Component Value Date   WBC 8.3 05/20/2021   HGB 10.3 (L) 05/20/2021   HCT 31.5 (L) 05/20/2021   MCV 76.9 (L) 05/20/2021   PLT 478.0 (H) 05/20/2021   NEUTROABS 6.5 05/20/2021    CMP  Lab Results  Component Value Date   NA 139 07/11/2021   K 4.0 07/11/2021   CL 105 07/11/2021   CO2 28 07/11/2021   GLUCOSE 98 07/11/2021   BUN 22 07/11/2021   CREATININE 0.76 07/11/2021   CALCIUM 9.8 07/11/2021   PROT 7.0 07/11/2021   ALBUMIN 3.6 07/11/2021   AST 21 07/11/2021   ALT 24 07/11/2021   ALKPHOS 107 07/11/2021   BILITOT 0.5 07/11/2021   GFRNONAA >60 04/11/2021   GFRAA >60 07/15/2020    Lab Results  Component Value Date   CEA1 12.99 (H) 01/10/2021   CEA 1.36 05/09/2021    Medications: I have reviewed the patient's current medications.   Assessment/Plan: Rectal cancer CT abdomen/pelvis  06/27/2020-irregular masslike thickening of the rectum, increased colonic stool, perirectal infiltration into the presacral space, tiny bibasilar pulmonary nodules CT chest 07/12/2020-multiple small pulmonary nodules, largest 3 mm in the left apex MRI pelvis 07/12/2020-tumor at 11.4 centimeters from the anal verge, 6.6 cm from the internal sphincter, tumor extends through the right lateral rectal wall and directly involves an adjacent loop of distal sigmoid colon with invasion of anterior peritoneal reflection and involvement of the right seminal vesicle, no lymphadenopathy.  T4N0 Colonoscopy 07/17/2020-mass at 12 cm from the anal verge, completely obstructing mass at 23 cm from the anal verge, large internal hemorrhoids, biopsy confirmed adenocarcinoma Cycle 1 FOLFOX 07/30/2020 Cycle 2 FOLFOX 08/13/2020 Cycle 3 FOLFOX 08/27/2020 Cycle 4 FOLFOX 09/09/2020 Cycle 5 FOLFOX 09/24/2020, 5-FU bolus held, oxaliplatin dose reduced secondary to neutropenia Cycle 6 FOLFOX 10/07/2020, 5-FU bolus and full dose oxaliplatin, Neulasta Cycle 7 FOLFOX 10/21/2020, Neulasta CTs 10/31/2020-no change in rectal mass, no evidence of nodal metastases, unchanged nonspecific lung nodules Cycle 8 FOLFOX 11/04/2020 Radiation/Xeloda  11/27/2020-01/03/2021 Low anterior resection 03/05/2021-residual moderately differentiated adenocarcinoma, treatment response score 2, tumor extends to the sigmoid colon, 26 negative lymph nodes, [id number], negative resection margins, no loss of mismatch repair protein expression, MSI stable   2.  Iron deficiency anemia secondary to #1 3.  Hemorrhoids 4.  Rectal pain secondary to hemorrhoids versus the primary rectal tumor 5.  Constipation secondary to #1, improved with a stool softener and laxative  regimen 6.  Small indeterminate pulmonary nodules 7.  Arthritis 8.  Neutropenia secondary to chemotherapy 9.  Oxaliplatin neuropathy-mild loss of vibratory sense on exam 09/24/2020, 10/07/2020, 10/21/2020,  11/04/2020      Disposition: Shane [name] is in clinical remission from rectal cancer.  He will continue follow-up with Dr. Marcello Moores in interventional radiology for management of the pelvic drain.  I refilled his prescription for hydrocodone.  He plans to obtain an influenza vaccine and COVID-vaccine today.  The CEA is normal.  Shane [name] will return for a Port-A-Cath flush in 8 weeks and an office visit in 16 weeks.  Betsy Coder, MD  09/05/2021  11:56 AM

## 2021-09-05 NOTE — Progress Notes (Signed)
   [id number]  Name:  Dezmen Alcock.    MRN: [id number] DOB: 1951-08-14  09/05/2021  Mr. Vandewater was observed post Covid-19 immunization for 15 minutes without incident. He was provided with Vaccine Information Sheet and instruction to access the V-Safe system.   Mr. Bozard was instructed to call 911 with any severe reactions post vaccine: Difficulty breathing  Swelling of face and throat  A fast heartbeat  A bad rash all over body  Dizziness and weakness   Immunizations Administered     Name Date Dose VIS Date Route   Pfizer Covid-19 Vaccine Bivalent Booster 09/05/2021 12:53 PM 0.3 mL 07/09/2021 Intramuscular   Manufacturer: [place]   Lot: [id number]   [place]: [phone]

## 2021-09-08 ENCOUNTER — Telehealth: Payer: Self-pay

## 2021-09-08 DIAGNOSIS — C20 Malignant neoplasm of rectum: Secondary | ICD-10-CM

## 2021-09-08 NOTE — Telephone Encounter (Signed)
Schedule message sent to schedule lab in 8 weeks. Orders placed.

## 2021-09-17 ENCOUNTER — Other Ambulatory Visit: Payer: Medicare Other

## 2021-09-17 ENCOUNTER — Ambulatory Visit
Admission: RE | Admit: 2021-09-17 | Discharge: 2021-09-17 | Disposition: A | Discharge status: Home / Self Care | Payer: Medicare Other | Source: Ambulatory Visit | Attending: General Surgery | Admitting: General Surgery

## 2021-09-17 DIAGNOSIS — K604 Rectal fistula: Secondary | ICD-10-CM | POA: Diagnosis not present

## 2021-09-17 DIAGNOSIS — K651 Peritoneal abscess: Secondary | ICD-10-CM

## 2021-09-17 DIAGNOSIS — N281 Cyst of kidney, acquired: Secondary | ICD-10-CM | POA: Diagnosis not present

## 2021-09-17 DIAGNOSIS — Z4682 Encounter for fitting and adjustment of non-vascular catheter: Secondary | ICD-10-CM | POA: Diagnosis not present

## 2021-09-17 DIAGNOSIS — N3289 Other specified disorders of bladder: Secondary | ICD-10-CM | POA: Diagnosis not present

## 2021-09-17 DIAGNOSIS — Z8719 Personal history of other diseases of the digestive system: Secondary | ICD-10-CM | POA: Diagnosis not present

## 2021-09-17 DIAGNOSIS — Z85048 Personal history of other malignant neoplasm of rectum, rectosigmoid junction, and anus: Secondary | ICD-10-CM | POA: Diagnosis not present

## 2021-09-17 HISTORY — PX: IR RADIOLOGIST EVAL & MGMT: IMG5224

## 2021-09-17 MED ORDER — IOPAMIDOL (ISOVUE-370) INJECTION 76%
75.0000 mL | Freq: Once | INTRAVENOUS | Status: AC | PRN
  Administered 2021-09-17: 75 mL via INTRAVENOUS

## 2021-09-17 NOTE — Progress Notes (Signed)
Referring Physician(s): [name],Alicia  Chief Complaint: The patient is seen in follow up today s/p TG abscess drain- placed 04/02/21  History of present illness:  Chief Complaint: The patient is seen in follow up today s/p drains placed in IR for pelvic abscess. 5/3: LUQ drain--6/3 removed 5/12 RLQ drain--6/2 removed 5/26: left transgluteal drain-- still in place  Hx rectal cancer TG drain remains OP scant per pt Last exchange 07/29/21 Follows with Dr Joyice Faster-- to see her Nov 15 Denies fever; denies N/V Flushes daily--- OP is 5 cc daily  Scheduled today for Re CT and drain injection  Past Medical History:  Diagnosis Date   Allergy    Arthritis    knee   Rectal cancer ([place]) 08/2020    Past Surgical History:  Procedure Laterality Date   ESOPHAGOGASTRODUODENOSCOPY (EGD) WITH PROPOFOL N/A 03/14/2021   Procedure: ESOPHAGOGASTRODUODENOSCOPY (EGD) WITH PROPOFOL;  Surgeon: Lavena Bullion, DO;  [facility]: [facility];  Service: Gastroenterology;  Laterality: N/A;   IR CATHETER TUBE CHANGE  07/29/2021   IR IMAGING GUIDED PORT INSERTION  07/25/2020   IR RADIOLOGIST EVAL & MGMT  04/22/2021   IR RADIOLOGIST EVAL & MGMT  05/07/2021   IR RADIOLOGIST EVAL & MGMT  07/24/2021   IR RADIOLOGIST EVAL & MGMT  09/17/2021   IR SINUS/FIST TUBE CHK-NON GI  04/10/2021   IR SINUS/FIST TUBE CHK-NON GI  04/10/2021   IR US GUIDE BX ASP/DRAIN  03/11/2021   OSTOMY N/A 03/05/2021   Procedure: OSTOMY;  Surgeon: Leighton Ruff, MD;  Location: [facility];  Service: General;  Laterality: N/A;   WISDOM TOOTH EXTRACTION     XI ROBOTIC ASSISTED LOWER ANTERIOR RESECTION N/A 03/05/2021   Procedure: XI ROBOTIC ASSISTED LOWER ANTERIOR RESECTION WITH MOBILIZATION OF THE SPLENIC FLEXURE OF THE COLON AND END COLOSTOMY;  Surgeon: Leighton Ruff, MD;  Location: [facility];  Service: General;  Laterality: N/A;  6 HOURS    Allergies: Nsaids  Medications: Prior to Admission medications   Medication Sig Start Date End Date  Taking? Authorizing Provider  COVID-19 mRNA bivalent vaccine, Pfizer, (PFIZER COVID-19 VAC BIVALENT) injection Inject into the muscle. 09/05/21   Carlyle Basques, MD  Diclofenac Sodium 2 % SOLN Place 2 g onto the skin 2 (two) times daily. Patient taking differently: Place 2 g onto the skin daily as needed (pain). 06/30/19   Lyndal Pulley, DO  furosemide (LASIX) 20 MG tablet TAKE 1 TABLET(20 MG) BY MOUTH DAILY AS NEEDED FOR SWELLING 07/11/21   Marrian Salvage, FNP  HYDROcodone-acetaminophen (NORCO/VICODIN) 5-325 MG tablet Take 1 tablet by mouth every 6 (six) hours as needed for moderate pain. 09/05/21   Ladell Pier, MD  influenza vaccine adjuvanted (FLUAD) 0.5 ML injection Inject into the muscle. 09/05/21   Carlyle Basques, MD  naproxen (NAPROSYN) 500 MG tablet Take 1 tablet (500 mg total) by mouth 2 (two) times daily as needed for moderate pain. 09/05/21   Ladell Pier, MD  oxyCODONE (OXY IR/ROXICODONE) 5 MG immediate release tablet Take 1-2 tablets (5-10 mg total) by mouth every 6 (six) hours as needed for severe pain. 05/[birth year]49   Leighton Ruff, MD  pravastatin (PRAVACHOL) 20 MG tablet TAKE 1 TABLET(20 MG) BY MOUTH DAILY Patient taking differently: Take 20 mg by mouth daily. 11/12/20   Marrian Salvage, FNP  Sodium Chloride Flush (NORMAL SALINE FLUSH) 0.9 % SOLN Flush once daily 05/26/21   Criselda Peaches, MD     Family History  Problem Relation Age of  Onset   Healthy Mother    Hypertension Father    Stroke Father    Colon cancer Neg Hx    Esophageal cancer Neg Hx    Stomach cancer Neg Hx    Rectal cancer Neg Hx     Social History   Socioeconomic History   Marital status: Married    Spouse name: Not on file   Number of children: 2   Years of education: 14   Highest education level: Not on file  Occupational History   Occupation: Armed forces technical officer    Comment: retired  Tobacco Use   Smoking status: Former    Packs/day: 1.00    Years: 10.00    Pack years:  10.00    Types: Cigarettes    Quit date: 08/09/1978    Years since quitting: 43.1   Smokeless tobacco: Never  Vaping Use   Vaping Use: Never used  Substance and Sexual Activity   Alcohol use: No    Alcohol/week: 0.0 standard drinks   Drug use: No   Sexual activity: Not Currently  Other Topics Concern   Not on file  Social History Narrative   Fun: [name] out with his grandkids, 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   Social Determinants of Radio broadcast assistant Strain: Not on file  Food Insecurity: No Food Insecurity   Worried About Charity fundraiser in the Last Year: Never true   Arboriculturist in the Last Year: Never true  Transportation Needs: No Transportation Needs   Lack of Transportation (Medical): No   Lack of Transportation (Non-Medical): No  Physical Activity: Not on file  Stress: Not on file  Social Connections: Not on file     Vital Signs: BP (!) 141/63   Pulse 68   Temp 98.2 F (36.8 C)   SpO2 100%   Physical Exam Skin:    General: Skin is dry.     Comments: Site is c/d/I No bleeding No sign of infection OP in JP is thin fluid-- brown  CT shows smaller collection per Dr Pascal Lux---- still questionable communication to rectal stump Drain injection proves communication  Drain removed without complication Dressing placed    Imaging: CT ABDOMEN PELVIS W CONTRAST  Result Date: 09/17/2021 CLINICAL DATA:  History of rectal cancer, post LAR with end colostomy complicated by development of a postoperative abscess post multiple image guided percutaneous drainage catheters with remaining right transgluteal approach drainage catheter placement on 04/03/2021, subsequently exchanged and upsized on 07/29/2021. Patient continues to flush the percutaneous drainage catheter twice per day. He reports approximately 5 cc of output from the drainage cathete. He is not presently on antibiotics. He denies worsening abdominal or pelvic pain. EXAM: CT ABDOMEN AND PELVIS WITH CONTRAST  TECHNIQUE: Multidetector CT imaging of the abdomen and pelvis was performed using the standard protocol following bolus administration of intravenous contrast. CONTRAST:  97mL ISOVUE-370 IOPAMIDOL (ISOVUE-370) INJECTION 76% COMPARISON:  CT abdomen and pelvis-07/24/2021 fluoroscopic guided drainage catheter exchange and up sizing-07/29/2021 CT guided right trans gluteal approach percutaneous drainage catheter placement-04/03/2021 FINDINGS: Lower chest: Limited visualization of the lower thorax demonstrates minimal bibasilar subsegmental atelectasis within the imaged lung bases. No focal airspace opacities. No pleural effusion. Normal heart size.  No pericardial effusion. Hepatobiliary: Normal hepatic contour. The known subcapsular collection about the posterior aspect the right lobe of the liver measuring 2.5 x 1.2 cm (image 12, series 2), unchanged compared to the 07/24/2021 examination. No discrete hepatic lesions. The gallbladder is again noted  to be in a slightly atopic location, posterior to the right lobe of the liver. No radiopaque gallstones. No intra or extrahepatic biliary ductal dilatation. No ascites. Pancreas: Normal appearance of the pancreas. Spleen: Normal appearance of the spleen. Adrenals/Urinary Tract: There is symmetric enhancement and excretion of the bilateral kidneys. Redemonstrated right-sided renal cysts. No discrete left-sided renal lesions. No urinary obstruction or perinephric stranding. Normal appearance the bilateral adrenal glands. There is mild thickening the urinary bladder wall, potentially accentuated due to underdistention. Stomach/Bowel: Stable positioning of left trans gluteal approach percutaneous drainage catheter with end coiled and locked within the presacral fluid collection which measures approximately 3.6 x 3.3 cm (image 70, series 2), previously, 4.1 x 4.1 cm. The collection again appears to communicate with the rectal stump (best seen on sagittal image 98, series 8,  similar to the 07/24/2021 examination. There is a minimal amount of ill-defined stranding about the presacral space and lower pelvis without new definable/drainable fluid collection. Stable sequela of end colostomy within the left lower abdominal quadrant. Moderate colonic stool burden without evidence of enteric obstruction. No significant hiatal hernia. No discrete areas of bowel wall thickening. No pneumoperitoneum, pneumatosis or portal venous gas. Vascular/Lymphatic: Moderate to large amount of mixed calcified and noncalcified atherosclerotic plaque within normal caliber abdominal aorta, not resulting in hemodynamically significant stenosis. 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 No bulky retroperitoneal, mesenteric, pelvic or inguinal lymphadenopathy. Reproductive: Borderline prostatomegaly. Redemonstrated large bilateral hydroceles, similar to the 07/2021 examination. Other: Linear subcutaneous stranding within the right-sided ventral abdominal wall, likely at the location of previous laparotomy port. Minimal amount of subcutaneous edema about the midline of the low back. Musculoskeletal: No acute or aggressive osseous abnormalities. Severe DDD of L5-S1 with disc space height loss, endplate irregularity and sclerosis. Mild degenerative change the bilateral hips with joint space loss, subchondral sclerosis and osteophytosis. Note is made of small bilateral os acetabuli. Electronically Signed   By: Sandi Mariscal M.D.   On: 09/17/2021 13:54   IR Radiologist Eval & Mgmt  Result Date: 09/17/2021 Please refer to notes tab for details about interventional procedure. (Op Note)   Labs:  CBC: Recent Labs    04/07/21 0330 04/09/21 0304 04/11/21 0240 05/20/21 1350  WBC 17.1* 14.7* 10.0 8.3  HGB 9.7* 9.5* 9.0* 10.3*  HCT 30.0* 29.5* 28.7* 31.5*  PLT 422* 441* 501* 478.0*    COAGS: Recent Labs     03/20/21 0313 04/03/21 0319  INR 1.3* 1.2    BMP: Recent Labs    04/03/21 0319 04/07/21 0330 04/08/21 0446 04/11/21 0240 05/20/21 1350 07/11/21 1136  NA 133* 133* 132* 135 132* 139  K 4.1 4.3 4.2 3.2* 4.2 4.0  CL 101 101 99 103 97 105  CO2 27 26 28 26 24 28   GLUCOSE 113* 113* 104* 101* 114* 98  BUN 16 14 14 15 14 22   CALCIUM 8.8* 9.0 9.0 8.9 10.1 9.8  CREATININE 0.58* 0.66 0.66 0.69 0.91 0.76  GFRNONAA >60 >60 >60 >60  --   --     LIVER FUNCTION TESTS: Recent Labs    04/07/21 0330 04/08/21 0446 05/20/21 1350 07/11/21 1136  BILITOT 0.2* 0.2* 0.6 0.5  AST 19 19 11 21   ALT 22 22 6 24   ALKPHOS 97 97 90 107  PROT 7.2 7.2 8.0 7.0  ALBUMIN 2.1* 2.0* 3.6 3.6    Assessment:  Rectal cancer-- post op abscesses 3  drains placed in IR May 2022-- only one remains-- TG drain Drain  injection does show communication to rectal stump although CT revealing smaller collection per Dr Pascal Lux. Pt states he was seen by Dr Joyice Faster just week before last--- he was told drain could likely come out--- but she wanted IR to see pt first. Dr Pascal Lux had long discussion with pt about drain Has been in place 6 months-- likely will not change from here on Drain removed per Dr watts Pt to keep appt with Dr Marcello Moores Nov 15  Signed: Lavonia Drafts, PA-C 09/17/2021, 1:59 PM   Please refer to Dr. Pascal Lux attestation of this note for management and plan.

## 2021-09-18 ENCOUNTER — Other Ambulatory Visit: Payer: Medicare Other

## 2021-09-19 ENCOUNTER — Other Ambulatory Visit: Payer: Medicare Other

## 2021-09-30 ENCOUNTER — Other Ambulatory Visit: Payer: Self-pay

## 2021-09-30 ENCOUNTER — Encounter: Payer: Self-pay | Admitting: Cardiovascular Disease

## 2021-09-30 ENCOUNTER — Ambulatory Visit: Payer: Medicare Other | Admitting: Cardiovascular Disease

## 2021-09-30 VITALS — BP 140/72 | HR 70 | Ht 70.0 in | Wt 169.4 lb

## 2021-09-30 DIAGNOSIS — R600 Localized edema: Secondary | ICD-10-CM | POA: Diagnosis not present

## 2021-09-30 DIAGNOSIS — E785 Hyperlipidemia, unspecified: Secondary | ICD-10-CM

## 2021-09-30 DIAGNOSIS — I700 Atherosclerosis of aorta: Secondary | ICD-10-CM

## 2021-09-30 DIAGNOSIS — R609 Edema, unspecified: Secondary | ICD-10-CM

## 2021-09-30 NOTE — Patient Instructions (Signed)
Medication Instructions:  No changes *If you need a refill on your cardiac medications before your next appointment, please call your pharmacy*   Lab Work: None ordered If you have labs (blood work) drawn today and your tests are completely normal, you will receive your results only by: [place] (if you have MyChart) OR A paper copy in the mail If you have any lab test that is abnormal or we need to change your treatment, we will call you to review the results.   Testing/Procedures: Your physician has requested that you have an echocardiogram. Echocardiography is a painless test that uses sound waves to create images of your heart. It provides your doctor with information about the size and shape of your heart and how well your heart's chambers and valves are working. You may receive an ultrasound enhancing agent through an IV if needed to better visualize your heart during the echo.This procedure takes approximately one hour. There are no restrictions for this procedure. This will take place at the [address].   Follow-Up: At [facility], you and your health needs are our priority.  As part of our continuing mission to provide you with exceptional heart care, we have created designated Provider Care Teams.  These Care Teams include your primary Cardiologist (physician) and Advanced Practice Providers (APPs -  Physician Assistants and Nurse Practitioners) who all work together to provide you with the care you need, when you need it.  We recommend signing up for the patient portal called "MyChart".  Sign up information is provided on this After Visit Summary.  MyChart is used to connect with patients for Virtual Visits (Telemedicine).  Patients are able to view lab/test results, encounter notes, upcoming appointments, etc.  Non-urgent messages can be sent to your provider as well.   To learn more about what you can do with MyChart, go to [email or web address].     Your next appointment:   Follow up as needed with Dr. Sallyanne Kuster

## 2021-09-30 NOTE — Progress Notes (Signed)
Cardiology Office Note:    Date:  10/05/2021   ID:  Shane [name]., DOB January 05, 1951, MRN [id number]  PCP:  Marrian Salvage, [place] [facility] Cardiologist:  None     Referring MD: Marrian Salvage,*   No chief complaint on file. Shane [name]. is a 70 y.o. male who is being seen today for the evaluation of edema at the request of Marrian Salvage,*.   History of Present Illness:    Shane [name]. is a 70 y.o. male with a hx of large rectal carcinoma (T4N0) s/p neoadjuvant chemotherapy and radiation therapy and final curative surgery with end-colostomy in April-May 7616, complicated by pelvic abscess requiring transgluteal drainage, presenting with complaints of bilateral lower extremity edema that is consistently worse on the left side.  To venous duplex ultrasounds performed in May and September 2022 did not show any evidence of DVT.  He has never had an echocardiogram.  He denies dyspnea on exertion, orthopnea or PND.  He does not have angina at rest or with activity.  For a long time activity was limited by a transgluteal pelvic drain.  Only needed for the last several weeks as he able to be more active.  He is not troubled by palpitations, dizziness or syncope.  Activity is primarily limited by pain in his right knee.  He denies headaches, focal neurological complaints or intermittent claudication.  He is lost about 50 pounds since undergoing surgery in May.  He takes furosemide as needed for swelling, but this is almost a daily occurrence.  He is taking NSAIDs for knee pain, daily.  His past medical history significantly negative for hypertension, diabetes mellitus or hyperlipidemia.  LDL checked in 2020 was only 73, although he also had a low HDL of 35.  He quit smoking in 1979.  He has normal renal function and he does not have hypothyroidism.  Pro BNP checked on 07/11/2021 was nominally elevated at 115 (upper limit of normal  100).  Past Medical History:  Diagnosis Date   Allergy    Arthritis    knee   Rectal cancer ([place]) 08/2020    Past Surgical History:  Procedure Laterality Date   ESOPHAGOGASTRODUODENOSCOPY (EGD) WITH PROPOFOL N/A 03/14/2021   Procedure: ESOPHAGOGASTRODUODENOSCOPY (EGD) WITH PROPOFOL;  Surgeon: Lavena Bullion, DO;  [facility];  Service: Gastroenterology;  Laterality: N/A;   IR CATHETER TUBE CHANGE  07/29/2021   IR IMAGING GUIDED PORT INSERTION  07/25/2020   IR RADIOLOGIST EVAL & MGMT  04/22/2021   IR RADIOLOGIST EVAL & MGMT  05/07/2021   IR RADIOLOGIST EVAL & MGMT  07/24/2021   IR RADIOLOGIST EVAL & MGMT  09/17/2021   IR SINUS/FIST TUBE CHK-NON GI  04/10/2021   IR SINUS/FIST TUBE CHK-NON GI  04/10/2021   IR US GUIDE BX ASP/DRAIN  03/11/2021   OSTOMY N/A 03/05/2021   Procedure: OSTOMY;  Surgeon: Leighton Ruff, MD;  Location: [facility];  Service: General;  Laterality: N/A;   WISDOM TOOTH EXTRACTION     XI ROBOTIC ASSISTED LOWER ANTERIOR RESECTION N/A 03/05/2021   Procedure: XI ROBOTIC ASSISTED LOWER ANTERIOR RESECTION WITH MOBILIZATION OF THE SPLENIC FLEXURE OF THE COLON AND END COLOSTOMY;  Surgeon: Leighton Ruff, MD;  Location: [facility];  Service: General;  Laterality: N/A;  6 HOURS    Current Medications: Current Meds  Medication Sig   Diclofenac Sodium 2 % SOLN Place 2 g onto the skin 2 (two) times daily. (Patient  taking differently: Place 2 g onto the skin daily as needed (pain).)   furosemide (LASIX) 20 MG tablet TAKE 1 TABLET(20 MG) BY MOUTH DAILY AS NEEDED FOR SWELLING   pravastatin (PRAVACHOL) 20 MG tablet TAKE 1 TABLET(20 MG) BY MOUTH DAILY (Patient taking differently: Take 20 mg by mouth daily.)     Allergies:   Nsaids   Social History   Socioeconomic History   Marital status: Married    Spouse name: Not on file   Number of children: 2   Years of education: 14   Highest education level: Not on file  Occupational History   Occupation: Armed forces technical officer    Comment:  retired  Tobacco Use   Smoking status: Former    Packs/day: 1.00    Years: 10.00    Pack years: 10.00    Types: Cigarettes    Quit date: 08/09/1978    Years since quitting: 43.1   Smokeless tobacco: Never  Vaping Use   Vaping Use: Never used  Substance and Sexual Activity   Alcohol use: No    Alcohol/week: 0.0 standard drinks   Drug use: No   Sexual activity: Not Currently  Other Topics Concern   Not on file  Social History Narrative   Fun: [name] out with his grandkids, 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   Social Determinants of Radio broadcast assistant Strain: Not on file  Food Insecurity: No Food Insecurity   Worried About Charity fundraiser in the Last Year: Never true   Arboriculturist in the Last Year: Never true  Transportation Needs: No Transportation Needs   Lack of Transportation (Medical): No   Lack of Transportation (Non-Medical): No  Physical Activity: Not on file  Stress: Not on file  Social Connections: Not on file     Family History: The patient's family history includes Healthy in his mother; Hypertension in his father; Stroke in his father. There is no history of Colon cancer, Esophageal cancer, Stomach cancer, or Rectal cancer.  ROS:   Please see the history of present illness.     All other systems reviewed and are negative.  EKGs/Labs/Other Studies Reviewed:    The following studies were reviewed today: Venous duplex ultrasonography from May 2022 in September 2022  CT abdomen and pelvis 09/17/2021 " Vascular/Lymphatic: Moderate to large amount of mixed calcified and noncalcified atherosclerotic plaque within normal caliber abdominal aorta, not resulting in hemodynamically significant stenosis. 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  EKG:  EKG is ordered today.  The ekg ordered today demonstrates normal sinus rhythm, nonspecific T wave changes in the inferior  leads, QTC 434 ms.  Recent Labs: 03/15/2021: B Natriuretic Peptide 188.7 04/07/2021: Magnesium 2.0 05/20/2021: Hemoglobin 10.3; Platelets 478.0; TSH 2.44 07/11/2021: ALT 24; BUN 22; Creatinine, Ser 0.76; Potassium 4.0; Pro B Natriuretic peptide (BNP) 115.0; Sodium 139  Recent Lipid Panel    Component Value Date/Time   CHOL 120 08/30/2019 1155   TRIG 84 04/07/2021 0330   HDL 34.80 (L) 08/30/2019 1155   CHOLHDL 3 08/30/2019 1155   VLDL 12.2 08/30/2019 1155   LDLCALC 73 08/30/2019 1155     Risk Assessment/Calculations:           Physical Exam:    VS:  BP 140/72 (BP Location: Left Arm, Patient Position: Sitting, Cuff Size: Normal)   Pulse 70   Ht 5\' 10"  (1.778 m)   Wt  169 lb 6.4 oz (76.8 kg)   SpO2 98%   BMI 24.31 kg/m     Wt Readings from Last 3 Encounters:  09/30/21 169 lb 6.4 oz (76.8 kg)  09/05/21 168 lb 9.6 oz (76.5 kg)  07/11/21 162 lb 3.2 oz (73.6 kg)     GEN:  Well nourished, well developed in no acute distress HEENT: Normal NECK: No JVD; No carotid bruits LYMPHATICS: No lymphadenopathy CARDIAC: RRR, no murmurs, rubs, gallops RESPIRATORY:  Clear to auscultation without rales, wheezing or rhonchi  ABDOMEN: Soft, non-tender, non-distended.  Left lower quadrant ostomy. MUSCULOSKELETAL: 1+ right ankle edema, 2+ left ankle edema; No deformity  SKIN: Warm and dry NEUROLOGIC:  Alert and oriented x 3 PSYCHIATRIC:  Normal affect   ASSESSMENT:    1. Bilateral lower extremity edema   2. Atherosclerosis of aorta ([place])   3. Dyslipidemia (high LDL; low HDL)    PLAN:    In order of problems listed above:  Edema: Doubt cardiac etiology.  He does not have any other cardiac symptoms or risk factors for cardiovascular disease.  The asymmetry suggests that the edema is likely due to poor lymphatic and/or venous drainage at the level of the pelvis, following his extensive surgery and pelvic abscess problems.  The elevation of the proBNP is tiny, probably normal range for his  age.  He does not have evidence of DVT on ultrasonography.  We will check an echocardiogram for completeness. Aortic atherosclerosis: Noted on his CT, but without symptoms of claudication or coronary artery disease.   Dyslipidemia: Labs in 2020 suggested mildly decreased HDL cholesterol, but otherwise lipid parameters in normal range on pravastatin.  Need updated lipid profile.            Medication Adjustments/Labs and Tests Ordered: Current medicines are reviewed at length with the patient today.  Concerns regarding medicines are outlined above.  Orders Placed This Encounter  Procedures   EKG 12-Lead   ECHOCARDIOGRAM COMPLETE   No orders of the defined types were placed in this encounter.   Patient Instructions  Medication Instructions:  No changes *If you need a refill on your cardiac medications before your next appointment, please call your pharmacy*   Lab Work: None ordered If you have labs (blood work) drawn today and your tests are completely normal, you will receive your results only by: [place] (if you have MyChart) OR A paper copy in the mail If you have any lab test that is abnormal or we need to change your treatment, we will call you to review the results.   Testing/Procedures: Your physician has requested that you have an echocardiogram. Echocardiography is a painless test that uses sound waves to create images of your heart. It provides your doctor with information about the size and shape of your heart and how well your heart's chambers and valves are working. You may receive an ultrasound enhancing agent through an IV if needed to better visualize your heart during the echo.This procedure takes approximately one hour. There are no restrictions for this procedure. This will take place at the [address].   Follow-Up: At [facility], you and your health needs are our priority.  As part of our continuing mission to provide you with  exceptional heart care, we have created designated Provider Care Teams.  These Care Teams include your primary Cardiologist (physician) and Advanced Practice Providers (APPs -  Physician Assistants and Nurse Practitioners) who all work together to provide you  with the care you need, when you need it.  We recommend signing up for the patient portal called "MyChart".  Sign up information is provided on this After Visit Summary.  MyChart is used to connect with patients for Virtual Visits (Telemedicine).  Patients are able to view lab/test results, encounter notes, upcoming appointments, etc.  Non-urgent messages can be sent to your provider as well.   To learn more about what you can do with MyChart, go to [email or web address].    Your next appointment:   Follow up as needed with Dr. Sallyanne Kuster   Signed, Sanda Klein, MD  10/05/2021 1:32 PM    [place]

## 2021-10-22 ENCOUNTER — Telehealth (HOSPITAL_COMMUNITY): Payer: Self-pay | Admitting: Cardiovascular Disease

## 2021-10-22 NOTE — Telephone Encounter (Signed)
Patient called and cancelled echocardiogram and did not wish to reschedule at this time. Order will be removed from the ECHO WQ.

## 2021-10-24 ENCOUNTER — Other Ambulatory Visit (HOSPITAL_COMMUNITY): Payer: Medicare Other

## 2021-10-28 ENCOUNTER — Other Ambulatory Visit: Payer: Self-pay

## 2021-10-28 ENCOUNTER — Inpatient Hospital Stay: Payer: Medicare Other | Attending: Physician Assistant

## 2021-10-28 ENCOUNTER — Inpatient Hospital Stay: Payer: Medicare Other

## 2021-10-28 DIAGNOSIS — C20 Malignant neoplasm of rectum: Secondary | ICD-10-CM | POA: Diagnosis present

## 2021-10-28 LAB — BASIC METABOLIC PANEL
Anion gap: 8 (ref 5–15)
BUN: 15 mg/dL (ref 8–23)
CO2: 27 mmol/L (ref 22–32)
Calcium: 9.2 mg/dL (ref 8.9–10.3)
Chloride: 105 mmol/L (ref 98–111)
Creatinine, Ser: 0.84 mg/dL (ref 0.61–1.24)
GFR, Estimated: >60 mL/min (ref >60)
Glucose, Bld: 97 mg/dL (ref 70–99)
Potassium: 3.4 mmol/L — ABNORMAL LOW (ref 3.5–5.1)
Sodium: 140 mmol/L (ref 135–145)

## 2021-11-09 IMAGING — CT CT ABD-PELV W/O CM
2 of 4 series · 14 of 46 positions shown, 16 images · non-contrast
Comparison: March 19, 2021.

CLINICAL DATA: History of rectal cancer. Abdominal pain. Status
post drainage of abscess.

EXAM:
CT ABDOMEN AND PELVIS WITHOUT CONTRAST
TECHNIQUE: Multidetector CT imaging of the abdomen and pelvis was performed
following the standard protocol without IV contrast.

[Series 2: axial st · axial · 0.86mm/px · z∈[+994,+1499]mm · 11 of 115 slices shown, 13 images]
[im 7/115  soft-tissue]
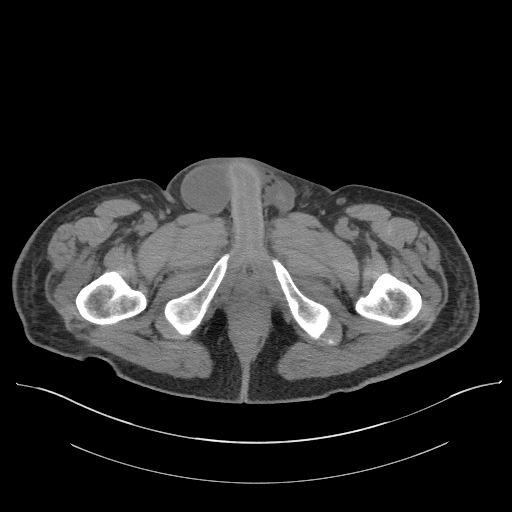
[im 7/115  bone]
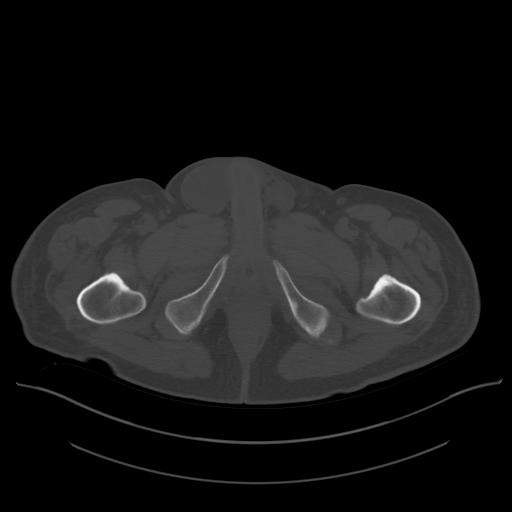
[im 21/115  soft-tissue]
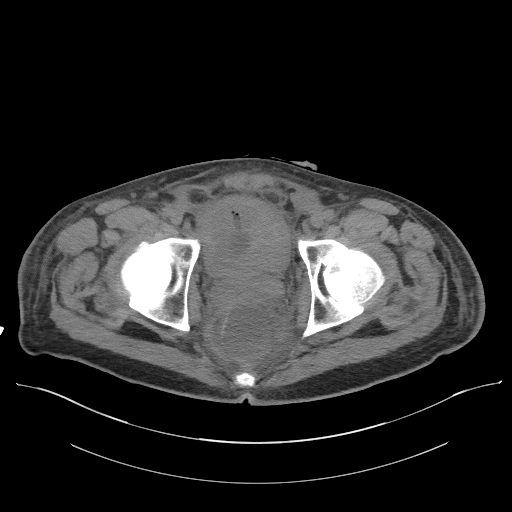
[im 27/115  soft-tissue]
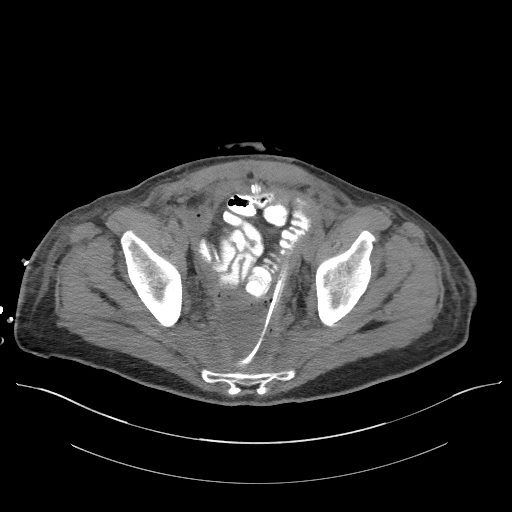
[im 41/115  soft-tissue]
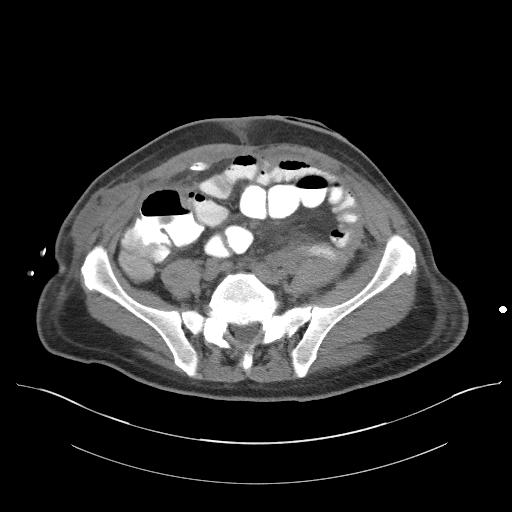
[im 47/115  soft-tissue]
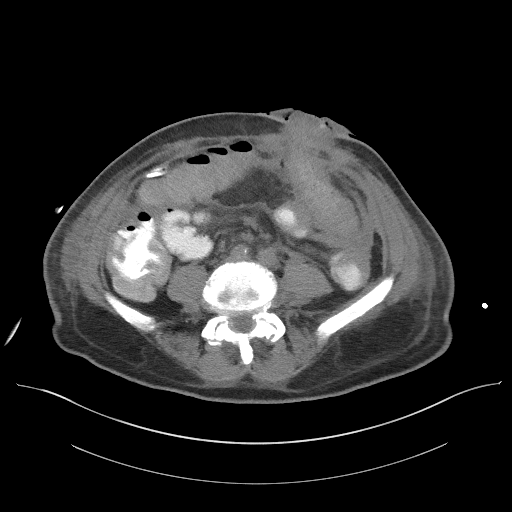
[im 61/115  soft-tissue]
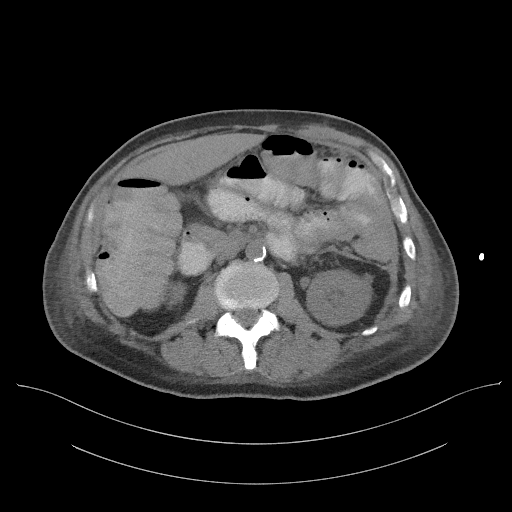
[im 68/115  soft-tissue]
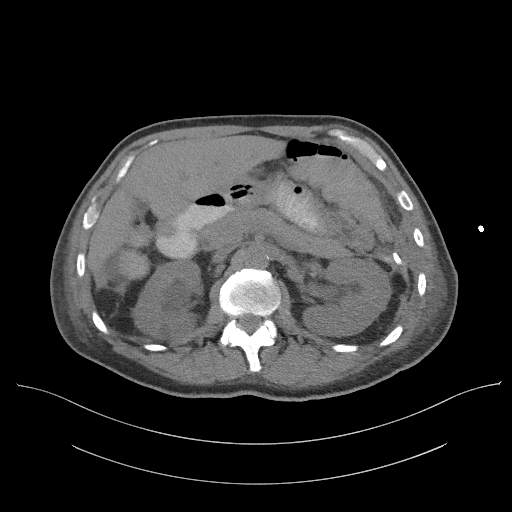
[im 74/115  soft-tissue]
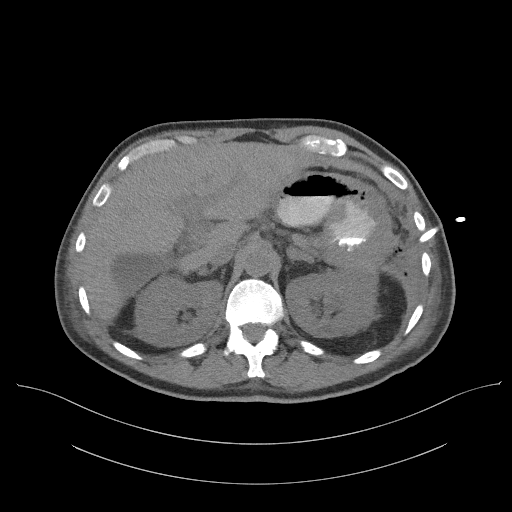
[im 88/115  soft-tissue]
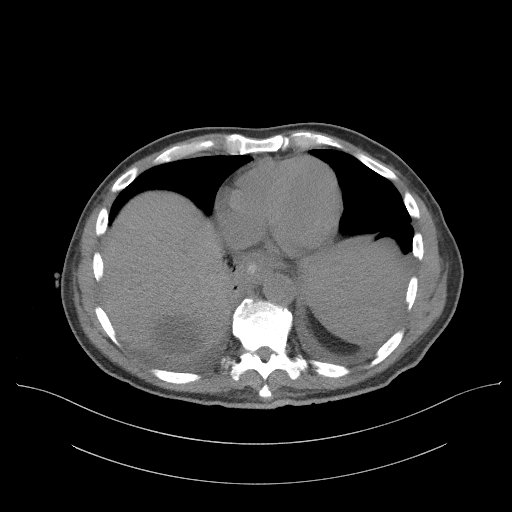
[im 88/115  bone]
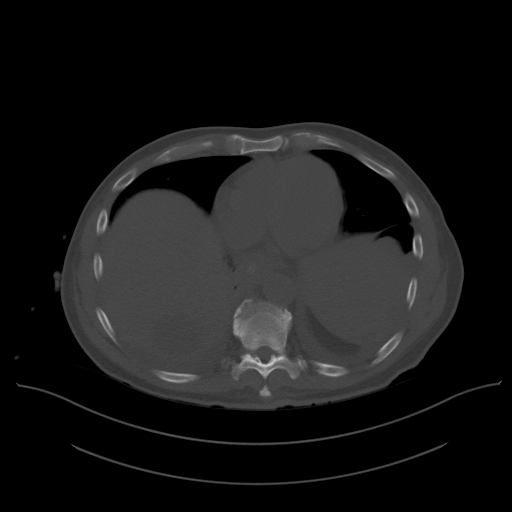
[im 94/115  soft-tissue]
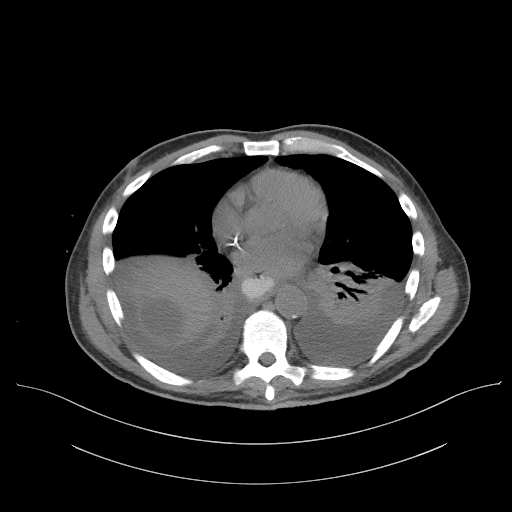
[im 108/115  soft-tissue]
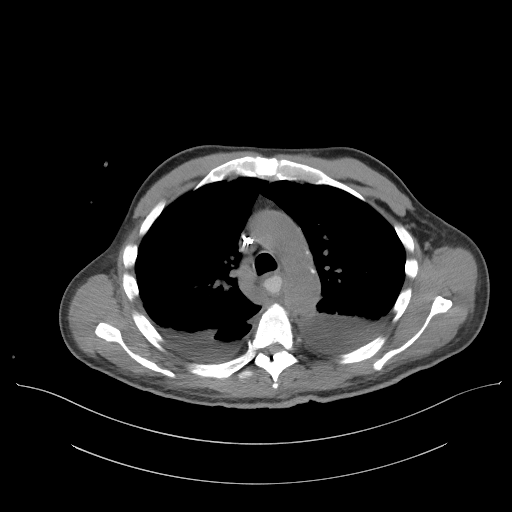

[Series 5: coronal st · coronal · 0.70mm/px · 3 of 97 slices shown]
[im 33/97  soft-tissue]
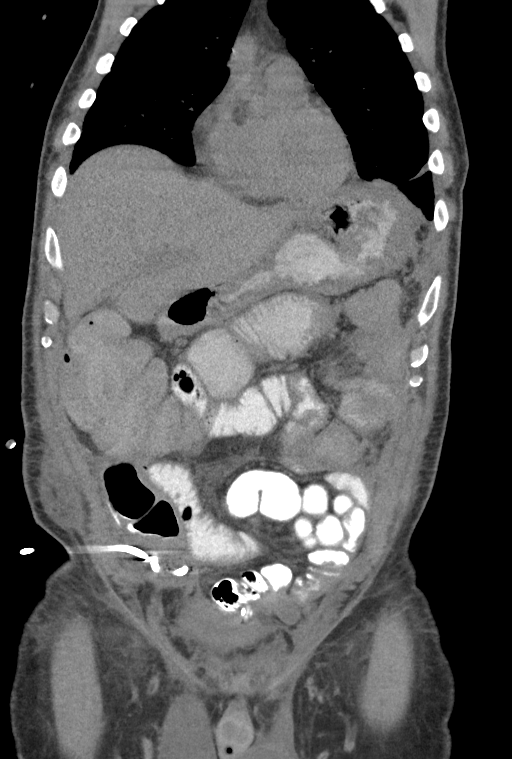
[im 43/97  soft-tissue]
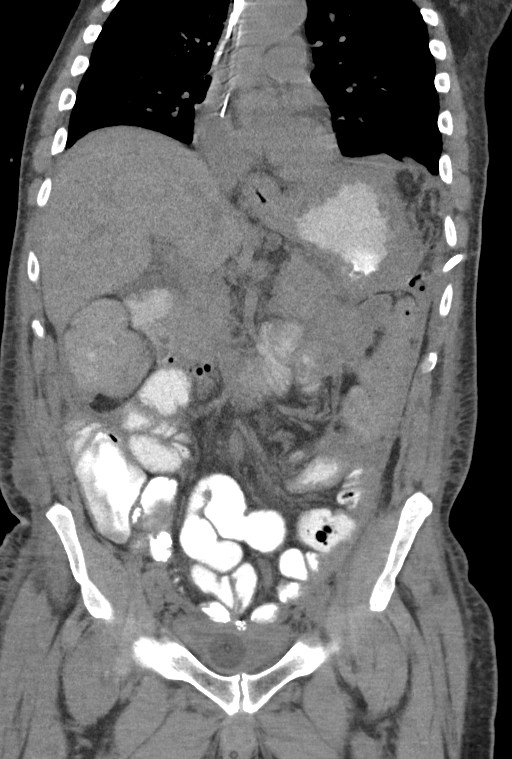
[im 54/97  soft-tissue]
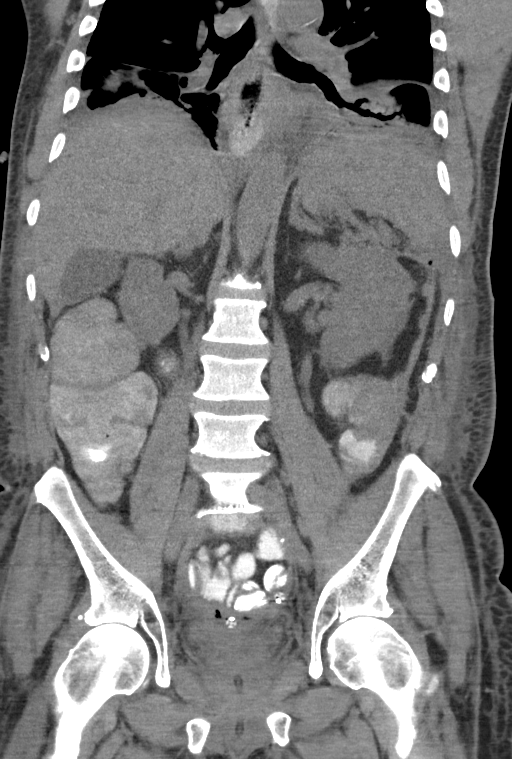

[14 of 46 positions shown; findings below may reference images not displayed]

FINDINGS: Lower chest: Bilateral pleural effusions are again noted with
adjacent subsegmental atelectasis of both lower lobes.

Hepatobiliary: No gallstones or biliary dilatation is noted. 5.6 cm
subcapsular fluid collection is noted posteriorly along the right
hepatic lobe which is slightly decreased compared to prior exam.

Pancreas: Unremarkable. No pancreatic ductal dilatation or
surrounding inflammatory changes.

Spleen: Normal in size without focal abnormality.

Adrenals/Urinary Tract: Adrenal glands are unremarkable.
Hydronephrosis noted on prior exam appears to have resolved.
Probable cyst is noted in lower pole of right kidney. Urinary
bladder is decompressed secondary to Foley catheter.

Stomach/Bowel: Stable wall and fold thickening is seen involving the
visualized distal esophagus and stomach concerning for inflammation.
Stable colostomy seen in left lower quadrant. Mildly dilated
proximal small bowel loops are noted concerning for ileus.

Vascular/Lymphatic: Aortic atherosclerosis. No enlarged abdominal or
pelvic lymph nodes.

Reproductive: Prostate is unremarkable. Bilateral hydroceles are
again noted.

Other: 8.7 x 5.9 cm fluid collection with some gas is noted in the
posterior pelvis with surgical drain present. This is concerning for
abscess. Continued presence of percutaneous drainage catheter in
fluid collection in left pericolic gutter. This collection appears
to be smaller compared to prior exam. There is been interval
placement of new drainage catheter into the right lower quadrant. No
significant residual fluid collection is noted. Mild anasarca is
noted.

Musculoskeletal: No acute or significant osseous findings.
IMPRESSION: Bilateral pleural effusions are again noted with adjacent
subsegmental atelectasis.

5.6 cm subcapsular fluid collection is noted posteriorly along the
right hepatic lobe which is slightly decreased compared to prior
exam.

[DATE] x 5.9 cm gas and fluid collection is noted in the posterior
cul-de-sac with surgical drain present. This is slightly enlarged
compared to prior exam and is concerning for abscess.

Continued presence of percutaneous drainage catheter seen in fluid
collection in left pericolic gutter superiorly. This collection
appears to be smaller compared to prior exam.

Interval placement of new percutaneous drainage catheter into the
right lower quadrant, with no significant residual fluid collection
noted.

Bilateral hydronephrosis noted on prior exam appears to have
resolved status post decompression of urinary bladder secondary to
Foley catheter.

Bilateral hydroceles are again noted.

Mildly dilated proximal small bowel loops are noted most consistent
with ileus.

Continued presence of distal esophageal and gastric wall thickening
is noted concerning for gastritis and esophagitis.

## 2021-12-19 ENCOUNTER — Encounter: Payer: Self-pay | Admitting: Oncology

## 2021-12-26 ENCOUNTER — Encounter: Payer: Self-pay | Admitting: *Deleted

## 2021-12-26 ENCOUNTER — Inpatient Hospital Stay (HOSPITAL_BASED_OUTPATIENT_CLINIC_OR_DEPARTMENT_OTHER): Payer: No Typology Code available for payment source | Admitting: Nurse Practitioner

## 2021-12-26 ENCOUNTER — Inpatient Hospital Stay: Payer: No Typology Code available for payment source | Attending: Physician Assistant

## 2021-12-26 ENCOUNTER — Other Ambulatory Visit: Payer: Self-pay

## 2021-12-26 ENCOUNTER — Encounter: Payer: Self-pay | Admitting: Oncology

## 2021-12-26 ENCOUNTER — Encounter: Payer: Self-pay | Admitting: Nurse Practitioner

## 2021-12-26 ENCOUNTER — Inpatient Hospital Stay: Payer: No Typology Code available for payment source

## 2021-12-26 VITALS — BP 172/77 | HR 83 | Temp 98.1°F | Resp 18 | Ht 70.0 in | Wt 179.0 lb

## 2021-12-26 DIAGNOSIS — C20 Malignant neoplasm of rectum: Secondary | ICD-10-CM | POA: Insufficient documentation

## 2021-12-26 DIAGNOSIS — R200 Anesthesia of skin: Secondary | ICD-10-CM | POA: Diagnosis not present

## 2021-12-26 DIAGNOSIS — Z95828 Presence of other vascular implants and grafts: Secondary | ICD-10-CM

## 2021-12-26 DIAGNOSIS — R202 Paresthesia of skin: Secondary | ICD-10-CM | POA: Diagnosis not present

## 2021-12-26 DIAGNOSIS — D509 Iron deficiency anemia, unspecified: Secondary | ICD-10-CM | POA: Insufficient documentation

## 2021-12-26 DIAGNOSIS — Z933 Colostomy status: Secondary | ICD-10-CM | POA: Insufficient documentation

## 2021-12-26 LAB — CEA (ACCESS): CEA (CHCC): 1.96 ng/mL (ref 0.00–5.00)

## 2021-12-26 MED ORDER — SODIUM CHLORIDE 0.9% FLUSH
10.0000 mL | Freq: Once | INTRAVENOUS | Status: AC
  Administered 2021-12-26: 10 mL

## 2021-12-26 MED ORDER — HEPARIN SOD (PORK) LOCK FLUSH 100 UNIT/ML IV SOLN
500.0000 [IU] | Freq: Once | INTRAVENOUS | Status: AC
  Administered 2021-12-26: 500 [IU]

## 2021-12-26 NOTE — Progress Notes (Signed)
[facility] OFFICE PROGRESS NOTE   Diagnosis: Rectal cancer  INTERVAL HISTORY:   Mr. Coccia returns as scheduled.  He notes mild numbness/tingling in the feet.  He has a good appetite.  Good energy level.  Pain resolved once the drain was removed.  Colostomy is functioning normally.  He is doing well managing the colostomy.  Objective:  Vital signs in last 24 hours:  Blood pressure (!) 172/77, pulse 83, temperature 98.1 F (36.7 C), temperature source Oral, resp. rate 18, height _0  (1.778 m), weight 179 lb (81.2 kg), SpO2 98 %.    Lymphatics: No palpable cervical, supraclavicular, axillary or inguinal lymph nodes. Resp: Lungs clear bilaterally. Cardio: Regular rate and rhythm. GI: Abdomen soft and nontender.  No hepatomegaly.  Left lower quadrant colostomy. Vascular: No leg edema. Port-A-Cath without erythema.  Lab Results:  Lab Results  Component Value Date   WBC 8.3 05/20/2021   HGB 10.3 (L) 05/20/2021   HCT 31.5 (L) 05/20/2021   MCV 76.9 (L) 05/20/2021   PLT 478.0 (H) 05/20/2021   NEUTROABS 6.5 05/20/2021    Imaging:  No results found.  Medications: I have reviewed the patient's current medications.  Assessment/Plan: Rectal cancer CT abdomen/pelvis 06/27/2020-irregular masslike thickening of the rectum, increased colonic stool, perirectal infiltration into the presacral space, tiny bibasilar pulmonary nodules CT chest 07/12/2020-multiple small pulmonary nodules, largest 3 mm in the left apex MRI pelvis 07/12/2020-tumor at 11.4 centimeters from the anal verge, 6.6 cm from the internal sphincter, tumor extends through the right lateral rectal wall and directly involves an adjacent loop of distal sigmoid colon with invasion of anterior peritoneal reflection and involvement of the right seminal vesicle, no lymphadenopathy.  T4N0 Colonoscopy 07/17/2020-mass at 12 cm from the anal verge, completely obstructing mass at 23 cm from the anal verge, large  internal hemorrhoids, biopsy confirmed adenocarcinoma Cycle 1 FOLFOX 07/30/2020 Cycle 2 FOLFOX 08/13/2020 Cycle 3 FOLFOX 08/27/2020 Cycle 4 FOLFOX 09/09/2020 Cycle 5 FOLFOX 09/24/2020, 5-FU bolus held, oxaliplatin dose reduced secondary to neutropenia Cycle 6 FOLFOX 10/07/2020, 5-FU bolus and full dose oxaliplatin, Neulasta Cycle 7 FOLFOX 10/21/2020, Neulasta CTs 10/31/2020-no change in rectal mass, no evidence of nodal metastases, unchanged nonspecific lung nodules Cycle 8 FOLFOX 11/04/2020 Radiation/Xeloda  11/27/2020-01/03/2021 Low anterior resection 03/05/2021-residual moderately differentiated adenocarcinoma, treatment response score 2, tumor extends to the sigmoid colon, 26 negative lymph nodes, [id number], negative resection margins, no loss of mismatch repair protein expression, MSI stable   2.  Iron deficiency anemia secondary to #1 3.  Hemorrhoids 4.  Rectal pain secondary to hemorrhoids versus the primary rectal tumor 5.  Constipation secondary to #1, improved with a stool softener and laxative regimen 6.  Small indeterminate pulmonary nodules 7.  Arthritis 8.  Neutropenia secondary to chemotherapy 9.  Oxaliplatin neuropathy-mild loss of vibratory sense on exam 09/24/2020, 10/07/2020, 10/21/2020, 11/04/2020      Disposition: Mr. Economou remains in clinical remission from rectal cancer.  We will follow-up on the CEA from today.  We referred him for a noncontrast CT scan of the chest, also made a referral to Dr. Bryan Lemma for GI surveillance.    The pelvic drain has been removed.  He is no longer having pain.  He will return for a Port-A-Cath flush in 8 weeks.  He will return for a CEA and office visit in 16 weeks.  He will contact the office in the interim with any problems.    Ned Card ANP/GNP-BC   12/26/2021  11:02 AM

## 2021-12-26 NOTE — Progress Notes (Signed)
Faxed referral order and last office note to [facility] for "Rectal cancer: GI surveillance"

## 2022-01-06 ENCOUNTER — Ambulatory Visit (HOSPITAL_BASED_OUTPATIENT_CLINIC_OR_DEPARTMENT_OTHER)
Admission: RE | Admit: 2022-01-06 | Discharge: 2022-01-06 | Disposition: A | Discharge status: Home / Self Care | Payer: No Typology Code available for payment source | Source: Ambulatory Visit | Attending: Nurse Practitioner | Admitting: Nurse Practitioner

## 2022-01-06 ENCOUNTER — Other Ambulatory Visit: Payer: Self-pay

## 2022-01-06 DIAGNOSIS — C20 Malignant neoplasm of rectum: Secondary | ICD-10-CM | POA: Diagnosis not present

## 2022-01-06 DIAGNOSIS — I700 Atherosclerosis of aorta: Secondary | ICD-10-CM | POA: Diagnosis not present

## 2022-01-06 DIAGNOSIS — R918 Other nonspecific abnormal finding of lung field: Secondary | ICD-10-CM | POA: Diagnosis not present

## 2022-01-07 ENCOUNTER — Encounter: Payer: Self-pay | Admitting: Family

## 2022-01-07 DIAGNOSIS — R600 Localized edema: Secondary | ICD-10-CM

## 2022-01-08 MED ORDER — FUROSEMIDE 20 MG PO TABS: ORAL_TABLET | ORAL | 0 refills | Status: DC

## 2022-01-08 NOTE — Telephone Encounter (Signed)
Patient states he spoke to [facility] and they do not have his lasix medication. Confirmed pharmacy with patient. Can the med be resent to them again? Please advise.  ?

## 2022-01-08 NOTE — Addendum Note (Signed)
Addended by: Kittie Plater, Paitlyn Mcclatchey HUA on: 01/08/2022 03:56 PM ? ? Modules accepted: Orders ? ?

## 2022-01-08 NOTE — Telephone Encounter (Addendum)
I have resent the medication. Hopefully it went through this time. I did get an electronic confirmation.  ? ?Confirmed with the pharmacy and they are getting rx ready for pt.  ?

## 2022-01-08 NOTE — Telephone Encounter (Signed)
I have called the pt and confirmed his new pharmacy. He stated that he got new insurance and they give him incentives for getting a CPE in the first 3 months. He is now scheduled for it on 01/20/22.  ? ?Rx has been filled with #[phone] with 0 refills until his appointment time.  ?

## 2022-01-20 ENCOUNTER — Ambulatory Visit (INDEPENDENT_AMBULATORY_CARE_PROVIDER_SITE_OTHER): Payer: No Typology Code available for payment source | Admitting: Family

## 2022-01-20 VITALS — BP 155/80 | HR 88 | Temp 98.0°F | Resp 18 | Ht 70.0 in | Wt 181.0 lb

## 2022-01-20 DIAGNOSIS — Z125 Encounter for screening for malignant neoplasm of prostate: Secondary | ICD-10-CM | POA: Diagnosis not present

## 2022-01-20 DIAGNOSIS — Z13220 Encounter for screening for lipoid disorders: Secondary | ICD-10-CM | POA: Diagnosis not present

## 2022-01-20 DIAGNOSIS — Z0000 Encounter for general adult medical examination without abnormal findings: Secondary | ICD-10-CM

## 2022-01-20 LAB — COMPREHENSIVE METABOLIC PANEL
ALT: 9 U/L (ref 0–53)
AST: 12 U/L (ref 0–37)
Albumin: 4.1 g/dL (ref 3.5–5.2)
Alkaline Phosphatase: 126 U/L — ABNORMAL HIGH (ref 39–117)
BUN: 15 mg/dL (ref 6–23)
CO2: 29 mEq/L (ref 19–32)
Calcium: 9.6 mg/dL (ref 8.4–10.5)
Chloride: 104 mEq/L (ref 96–112)
Creatinine, Ser: 0.86 mg/dL (ref 0.40–1.50)
GFR: 86.66 mL/min (ref >60.00)
Glucose, Bld: 87 mg/dL (ref 70–99)
Potassium: 4.3 mEq/L (ref 3.5–5.1)
Sodium: 138 mEq/L (ref 135–145)
Total Bilirubin: 0.7 mg/dL (ref 0.2–1.2)
Total Protein: 6.9 g/dL (ref 6.0–8.3)

## 2022-01-20 LAB — CBC WITH DIFFERENTIAL/PLATELET
Basophils Absolute: 0.1 10*3/uL (ref 0.0–0.1)
Basophils Relative: 3.3 % — ABNORMAL HIGH (ref 0.0–3.0)
Eosinophils Absolute: 0.2 10*3/uL (ref 0.0–0.7)
Eosinophils Relative: 6.4 % — ABNORMAL HIGH (ref 0.0–5.0)
HCT: 40.6 % (ref 39.0–52.0)
Hemoglobin: 13.4 g/dL (ref 13.0–17.0)
Lymphocytes Relative: 18 % (ref 12.0–46.0)
Lymphs Abs: 0.7 10*3/uL (ref 0.7–4.0)
MCHC: 33.1 g/dL (ref 30.0–36.0)
MCV: 91.6 fl (ref 78.0–100.0)
Monocytes Absolute: 0.4 10*3/uL (ref 0.1–1.0)
Monocytes Relative: 12 % (ref 3.0–12.0)
Neutro Abs: 2.2 10*3/uL (ref 1.4–7.7)
Neutrophils Relative %: 60.3 % (ref 43.0–77.0)
Platelets: 241 10*3/uL (ref 150.0–400.0)
RBC: 4.43 Mil/uL (ref 4.22–5.81)
RDW: 13.4 % (ref 11.5–15.5)
WBC: 3.7 10*3/uL — ABNORMAL LOW (ref 4.0–10.5)

## 2022-01-20 LAB — LIPID PANEL
Cholesterol: 147 mg/dL (ref 0–200)
HDL: 38.7 mg/dL — ABNORMAL LOW (ref >39.00)
LDL Cholesterol: 96 mg/dL (ref 0–99)
NonHDL: 107.92
Total CHOL/HDL Ratio: 4
Triglycerides: 61 mg/dL (ref 0.0–149.0)
VLDL: 12.2 mg/dL (ref 0.0–40.0)

## 2022-01-20 LAB — PSA: PSA: 0.09 ng/mL — ABNORMAL LOW (ref 0.10–4.00)

## 2022-01-20 MED ORDER — VALSARTAN 80 MG PO TABS: 80.0000 mg | ORAL_TABLET | Freq: Every day | ORAL | 1 refills | Status: DC

## 2022-01-20 NOTE — Patient Instructions (Addendum)
?Please let me hear from you in a few weeks regarding your response to your blood pressure medicine.  ? ?DASH Eating Plan ?DASH stands for Dietary Approaches to Stop Hypertension. The DASH eating plan is a healthy eating plan that has been shown to: ?Reduce high blood pressure (hypertension). ?Reduce your risk for type 2 diabetes, heart disease, and stroke. ?Help with weight loss. ?What are tips for following this plan? ?Reading food labels ?Check food labels for the amount of salt (sodium) per serving. Choose foods with less than 5 percent of the Daily Value of sodium. Generally, foods with less than 300 milligrams (mg) of sodium per serving fit into this eating plan. ?To find whole grains, look for the word "whole" as the first word in the ingredient list. ?Shopping ?Buy products labeled as "low-sodium" or "no salt added." ?Buy fresh foods. Avoid canned foods and pre-made or frozen meals. ?Cooking ?Avoid adding salt when cooking. Use salt-free seasonings or herbs instead of table salt or sea salt. Check with your health care provider or pharmacist before using salt substitutes. ?Do not fry foods. Cook foods using healthy methods such as baking, boiling, grilling, roasting, and broiling instead. ?Cook with heart-healthy oils, such as olive, canola, avocado, soybean, or sunflower oil. ?Meal planning ? ?Eat a balanced diet that includes: ?4 or more servings of fruits and 4 or more servings of vegetables each day. Try to fill one-half of your plate with fruits and vegetables. ?6-8 servings of whole grains each day. ?Less than 6 oz (170 g) of lean meat, poultry, or fish each day. A 3-oz (85-g) serving of meat is about the same size as a deck of cards. One egg equals 1 oz (28 g). ?2-3 servings of low-fat dairy each day. One serving is 1 cup (237 mL). ?1 serving of nuts, seeds, or beans 5 times each week. ?2-3 servings of heart-healthy fats. Healthy fats called omega-3 fatty acids are found in foods such as walnuts,  flaxseeds, fortified milks, and eggs. These fats are also found in cold-water fish, such as sardines, salmon, and mackerel. ?Limit how much you eat of: ?Canned or prepackaged foods. ?Food that is high in trans fat, such as some fried foods. ?Food that is high in saturated fat, such as fatty meat. ?Desserts and other sweets, sugary drinks, and other foods with added sugar. ?Full-fat dairy products. ?Do not salt foods before eating. ?Do not eat more than 4 egg yolks a week. ?Try to eat at least 2 vegetarian meals a week. ?Eat more home-cooked food and less restaurant, buffet, and fast food. ?Lifestyle ?When eating at a restaurant, ask that your food be prepared with less salt or no salt, if possible. ?If you drink alcohol: ?Limit how much you use to: ?0-1 drink a day for women who are not pregnant. ?0-2 drinks a day for men. ?Be aware of how much alcohol is in your drink. In the [place] one drink equals one 12 oz bottle of beer (355 mL), one 5 oz glass of wine (148 mL), or one 1? oz glass of hard liquor (44 mL). ?General information ?Avoid eating more than 2,300 mg of salt a day. If you have hypertension, you may need to reduce your sodium intake to 1,500 mg a day. ?Work with your health care provider to maintain a healthy body weight or to lose weight. Ask what an ideal weight is for you. ?Get at least 30 minutes of exercise that causes your heart to beat faster (aerobic exercise) most  days of the week. Activities may include walking, swimming, or biking. ?Work with your health care provider or dietitian to adjust your eating plan to your individual calorie needs. ?What foods should I eat? ?Fruits ?All fresh, dried, or frozen fruit. Canned fruit in natural juice (without added sugar). ?Vegetables ?Fresh or frozen vegetables (raw, steamed, roasted, or grilled). Low-sodium or reduced-sodium tomato and vegetable juice. Low-sodium or reduced-sodium tomato sauce and tomato paste. Low-sodium or reduced-sodium canned  vegetables. ?Grains ?Whole-grain or whole-wheat bread. Whole-grain or whole-wheat pasta. Brown rice. Modena Morrow. Bulgur. Whole-grain and low-sodium cereals. Pita bread. Low-fat, low-sodium crackers. Whole-wheat flour tortillas. ?Meats and other proteins ?Skinless chicken or [place]. Ground chicken or [place]. Pork with fat trimmed off. Fish and seafood. Egg whites. Dried beans, peas, or lentils. Unsalted nuts, nut butters, and seeds. Unsalted canned beans. Lean cuts of beef with fat trimmed off. Low-sodium, lean precooked or cured meat, such as sausages or meat loaves. ?Dairy ?Low-fat (1%) or fat-free (skim) milk. Reduced-fat, low-fat, or fat-free cheeses. Nonfat, low-sodium ricotta or cottage cheese. Low-fat or nonfat yogurt. Low-fat, low-sodium cheese. ?Fats and oils ?Soft margarine without trans fats. Vegetable oil. Reduced-fat, low-fat, or light mayonnaise and salad dressings (reduced-sodium). Canola, safflower, olive, avocado, soybean, and sunflower oils. Avocado. ?Seasonings and condiments ?Herbs. Spices. Seasoning mixes without salt. ?Other foods ?Unsalted popcorn and pretzels. Fat-free sweets. ?The items listed above may not be a complete list of foods and beverages you can eat. Contact a dietitian for more information. ?What foods should I avoid? ?Fruits ?Canned fruit in a light or heavy syrup. Fried fruit. Fruit in cream or butter sauce. ?Vegetables ?Creamed or fried vegetables. Vegetables in a cheese sauce. Regular canned vegetables (not low-sodium or reduced-sodium). Regular canned tomato sauce and paste (not low-sodium or reduced-sodium). Regular tomato and vegetable juice (not low-sodium or reduced-sodium). Angie Fava. Olives. ?Grains ?Baked goods made with fat, such as croissants, muffins, or some breads. Dry pasta or rice meal packs. ?Meats and other proteins ?Fatty cuts of meat. Ribs. Fried meat. Berniece Salines. Bologna, salami, and other precooked or cured meats, such as sausages or meat loaves. Fat from  the back of a pig (fatback). Bratwurst. Salted nuts and seeds. Canned beans with added salt. Canned or smoked fish. Whole eggs or egg yolks. Chicken or [place] with skin. ?Dairy ?Whole or 2% milk, cream, and half-and-half. Whole or full-fat cream cheese. Whole-fat or sweetened yogurt. Full-fat cheese. Nondairy creamers. Whipped toppings. Processed cheese and cheese spreads. ?Fats and oils ?Butter. Stick margarine. Lard. Shortening. Ghee. Bacon fat. Tropical oils, such as coconut, palm kernel, or palm oil. ?Seasonings and condiments ?Onion salt, garlic salt, seasoned salt, table salt, and sea salt. Worcestershire sauce. Tartar sauce. Barbecue sauce. Teriyaki sauce. Soy sauce, including reduced-sodium. Steak sauce. Canned and packaged gravies. Fish sauce. Oyster sauce. Cocktail sauce. Store-bought horseradish. Ketchup. Mustard. Meat flavorings and tenderizers. Bouillon cubes. Hot sauces. Pre-made or packaged marinades. Pre-made or packaged taco seasonings. Relishes. Regular salad dressings. ?Other foods ?Salted popcorn and pretzels. ?The items listed above may not be a complete list of foods and beverages you should avoid. Contact a dietitian for more information. ?Where to find more information ?National Heart, Lung, and Blood Institute: https://wilson-eaton.com/ ?American Heart Association: www.heart.org ?Academy of Nutrition and Dietetics: www.eatright.org ?[place]: www.kidney.org ?Summary ?The DASH eating plan is a healthy eating plan that has been shown to reduce high blood pressure (hypertension). It may also reduce your risk for type 2 diabetes, heart disease, and stroke. ?When on the DASH eating plan,  aim to eat more fresh fruits and vegetables, whole grains, lean proteins, low-fat dairy, and heart-healthy fats. ?With the DASH eating plan, you should limit salt (sodium) intake to 2,300 mg a day. If you have hypertension, you may need to reduce your sodium intake to 1,500 mg a day. ?Work with your  health care provider or dietitian to adjust your eating plan to your individual calorie needs. ?This information is not intended to replace advice given to you by your health care provider. Make sure you discuss any

## 2022-01-20 NOTE — Progress Notes (Signed)
?Shane [name]. is a 71 y.o. male with the following history as recorded in EpicCare:  ?Patient Active Problem List  ? Diagnosis Date Noted  ? Pressure injury of skin 03/25/2021  ? Colostomy in place [facility]) 03/19/2021  ? Hyperglycemia 03/14/2021  ? Hypernatremia 03/13/2021  ? Acute upper GI bleeding 03/13/2021  ? Enterococcal sepsis ([place]) 03/13/2021  ? Hypokalemia 03/12/2021  ? Malnutrition of moderate degree 03/11/2021  ? Anemia   ? Hypotension   ? Tachypnea 03/09/2021  ? Positive D dimer 03/09/2021  ? Acute respiratory distress   ? Ileus ([place])   ? AKI (acute kidney injury) ([place]) 03/08/2021  ? Hemorrhoids, internal 03/08/2021  ? Hypercholesteremia 03/08/2021  ? Pulmonary nodules 03/08/2021  ? Hypoxia 03/08/2021  ? Phimosis of penis s/p dilitation 03/05/2021 03/08/2021  ? Delirium 03/07/2021  ? Port-A-Cath in place 12/10/2020  ? Rectal carcinoma ([place]) 07/18/2020  ? Diarrhea 07/09/2020  ? Iron deficiency anemia 07/04/2020  ? Goals of care, counseling/discussion 07/04/2020  ? S/P carpal tunnel release 01/30/2020  ? Right carpal tunnel syndrome 09/19/2018  ? 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 Abnormality of gait 02/23/2018  ? Degenerative cervical disc 03/24/2016  ? 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 Routine general medical examination at a health care facility 09/02/2015  ? Numbness and tingling in hands 08/27/2015  ?  ?Current Outpatient Medications  ?Medication Sig Dispense Refill  ? furosemide (LASIX) 20 MG tablet TAKE 1 TABLET(20 MG) BY MOUTH DAILY AS NEEDED FOR SWELLING 90 tablet 0  ? valsartan (DIOVAN) 80 MG tablet Take 1 tablet (80 mg total) by mouth daily. 90 tablet 1  ? ?No current facility-administered medications for this visit.  ?  ?Allergies: Patient has no active allergies.  ?Past Medical History:  ?Diagnosis Date  ? Allergy   ? Arthritis   ? knee  ? Rectal cancer ([place]) 08/2020  ?  ?Past Surgical History:  ?Procedure Laterality Date  ?  ESOPHAGOGASTRODUODENOSCOPY (EGD) WITH PROPOFOL N/A 03/14/2021  ? Procedure: ESOPHAGOGASTRODUODENOSCOPY (EGD) WITH PROPOFOL;  Surgeon: Lavena Bullion, DO;  Location: [facility];  Service: Gastroenterology;  Laterality: N/A;  ? IR CATHETER TUBE CHANGE  07/29/2021  ? IR IMAGING GUIDED PORT INSERTION  07/25/2020  ? IR RADIOLOGIST EVAL & MGMT  04/22/2021  ? IR RADIOLOGIST EVAL & MGMT  05/07/2021  ? IR RADIOLOGIST EVAL & MGMT  07/24/2021  ? IR RADIOLOGIST EVAL & MGMT  09/17/2021  ? IR SINUS/FIST TUBE CHK-NON GI  04/10/2021  ? IR SINUS/FIST TUBE CHK-NON GI  04/10/2021  ? IR US GUIDE BX ASP/DRAIN  03/11/2021  ? OSTOMY N/A 03/05/2021  ? Procedure: OSTOMY;  Surgeon: Leighton Ruff, MD;  Location: [facility];  Service: General;  Laterality: N/A;  ? WISDOM TOOTH EXTRACTION    ? XI ROBOTIC ASSISTED LOWER ANTERIOR RESECTION N/A 03/05/2021  ? Procedure: XI ROBOTIC ASSISTED LOWER ANTERIOR RESECTION WITH MOBILIZATION OF THE SPLENIC FLEXURE OF THE COLON AND END COLOSTOMY;  Surgeon: Leighton Ruff, MD;  Location: [facility];  Service: General;  Laterality: N/A;  6 HOURS  ?  ?Family History  ?Problem Relation Age of Onset  ? Healthy Mother   ? Hypertension Father   ? Stroke Father   ? Colon cancer Neg Hx   ? Esophageal cancer Neg Hx   ? Stomach cancer Neg Hx   ? Rectal cancer Neg Hx   ?  ?Social History  ? ?Tobacco Use  ? Smoking status: Former  ?  Packs/day: 1.00  ?  Years: 10.00  ?  Pack years: 10.00  ?  Types: Cigarettes  ?  Quit date: 08/09/1978  ?  Years since quitting: 43.4  ? Smokeless tobacco: Never  ?Substance Use Topics  ? Alcohol use: No  ?  Alcohol/week: 0.0 standard drinks  ?  ?Subjective:  ?Presents for yearly CPE; no acute concerns today; continuing to work with oncology- in remission for rectal cancer;  ?Up to date on dental and eye exam;  ?Not taking Lasix daily- only using prn- has not taken in a few weeks; does check blood pressure at home and is consistent with what is seen here today; Denies any chest pain, shortness of breath,  blurred vision or headache ? ? ?Review of Systems  ?Constitutional: Negative.   ?HENT: Negative.    ?Eyes: Negative.   ?Respiratory: Negative.    ?Cardiovascular: Negative.   ?Gastrointestinal: Negative.   ?Genitourinary: Negative.   ?Musculoskeletal: Negative.   ?Skin: Negative.   ?Neurological: Negative.   ?Endo/Heme/Allergies: Negative.   ?Psychiatric/Behavioral: Negative.    ? ? ? ? ?Objective:  ?Vitals:  ? 01/20/22 0945  ?BP: (!) 155/80  ?Pulse: 88  ?Resp: 18  ?Temp: 98 ?F (36.7 ?C)  ?TempSrc: Oral  ?SpO2: 99%  ?Weight: 181 lb (82.1 kg)  ?Height: 5' 10"  (1.778 m)  ?  ?General: Well developed, well nourished, in no acute distress  ?Skin : Warm and dry.  ?Head: Normocephalic and atraumatic  ?Eyes: Sclera and conjunctiva clear; pupils round and reactive to light; extraocular movements intact  ?Ears: External normal; canals clear; tympanic membranes normal  ?Oropharynx: Pink, supple. No suspicious lesions  ?Neck: Supple without thyromegaly, adenopathy  ?Lungs: Respirations unlabored; clear to auscultation bilaterally without wheeze, rales, rhonchi  ?CVS exam: normal rate and regular rhythm.  ?Abdomen: Soft; nontender; nondistended; colostomy in place;  ?Musculoskeletal: No deformities; no active joint inflammation  ?Extremities: No edema, cyanosis, clubbing  ?Vessels: Symmetric bilaterally  ?Neurologic: Alert and oriented; speech intact; face symmetrical; moves all extremities well; CNII-XII intact without focal deficit  ? ?Assessment:  ?1. PE (physical exam), annual   ?2. Lipid screening   ?3. Prostate cancer screening   ?  ?Plan:  ?Age appropriate preventive healthcare needs addressed; encouraged regular eye doctor and dental exams; encouraged regular exercise; will update labs and refills as needed today; follow-up to be determined; ?DASH diet discussed; start Diovan 80 mg daily- call back with response in 2 weeks;  ? ?This visit occurred during the SARS-CoV-2 public health emergency.  Safety protocols were in  place, including screening questions prior to the visit, additional usage of staff PPE, and extensive cleaning of exam room while observing appropriate contact time as indicated for disinfecting solutions.  ? ? ? ?No follow-ups on file.  ?Orders Placed This Encounter  ?Procedures  ? CBC with Differential/Platelet  ? Comp Met (CMET)  ? Lipid panel  ? PSA  ?  ?Requested Prescriptions  ? ?Signed Prescriptions Disp Refills  ? valsartan (DIOVAN) 80 MG tablet 90 tablet 1  ?  Sig: Take 1 tablet (80 mg total) by mouth daily.  ?  ? ?

## 2022-02-04 ENCOUNTER — Telehealth: Payer: Self-pay | Admitting: *Deleted

## 2022-02-04 NOTE — Telephone Encounter (Signed)
Informed by [facility] that they attempted to reach patient x 2 without success and have since closed the referral. Will re-open if patient calls. Last attempt was 01/19/22. Called Shane [name] and provided him phone # to call them to re-open the referral. He agrees to call and make appointment. ?

## 2022-02-19 ENCOUNTER — Inpatient Hospital Stay: Payer: No Typology Code available for payment source | Attending: Physician Assistant

## 2022-02-19 VITALS — BP 128/60 | HR 63 | Temp 97.9°F | Resp 18 | Ht 70.0 in | Wt 175.2 lb

## 2022-02-19 DIAGNOSIS — Z95828 Presence of other vascular implants and grafts: Secondary | ICD-10-CM

## 2022-02-19 DIAGNOSIS — C20 Malignant neoplasm of rectum: Secondary | ICD-10-CM

## 2022-02-19 DIAGNOSIS — Z452 Encounter for adjustment and management of vascular access device: Secondary | ICD-10-CM | POA: Diagnosis not present

## 2022-02-19 MED ORDER — SODIUM CHLORIDE 0.9% FLUSH: 10.0000 mL | Freq: Once | INTRAVENOUS | Status: DC

## 2022-02-19 MED ORDER — HEPARIN SOD (PORK) LOCK FLUSH 100 UNIT/ML IV SOLN: 500.0000 [IU] | Freq: Once | INTRAVENOUS | Status: DC

## 2022-02-19 NOTE — Patient Instructions (Signed)
Heparin injection ?What is this medication? ?HEPARIN (HEP a rin) is an anticoagulant. It is used to treat or prevent clots in the veins, arteries, lungs, or heart. It stops clots from forming or getting bigger. This medicine prevents clotting during open-heart surgery, dialysis, or in patients who are confined to bed. ?This medicine may be used for other purposes; ask your health care provider or pharmacist if you have questions. ?COMMON BRAND NAME(S): Hep-Lock, Hep-Lock U/P, Hepflush-10, Monoject Prefill Advanced Heparin Lock Flush, SASH Normal Saline and Heparin ?What should I tell my care team before I take this medication? ?They need to know if you have any of these conditions: ?bleeding disorders, such as hemophilia or low blood platelets ?bowel disease or diverticulitis ?endocarditis ?high blood pressure ?liver disease ?recent surgery or delivery of a baby ?stomach ulcers ?an unusual or allergic reaction to heparin, benzyl alcohol, sulfites, other medicines, foods, dyes, or preservatives ?pregnant or trying to get pregnant ?breast-feeding ?How should I use this medication? ?This medicine is given by injection or infusion into a vein. It can also be given by injection of small amounts under the skin. It is usually given by a health care professional in a hospital or clinic setting. ?If you get this medicine at home, you will be taught how to prepare and give this medicine. Use exactly as directed. Take your medicine at regular intervals. Do not take it more often than directed. Do not stop taking except on your doctor's advice. Stopping this medicine may increase your risk of a blot clot. Be sure to refill your prescription before you run out of medicine. ?It is important that you put your used needles and syringes in a special sharps container. Do not put them in a trash can. If you do not have a sharps container, call your pharmacist or healthcare provider to get one. ?Talk to your pediatrician regarding the  use of this medicine in children. While this medicine may be prescribed for children for selected conditions, precautions do apply. ?Overdosage: If you think you have taken too much of this medicine contact a poison control center or emergency room at once. ?NOTE: This medicine is only for you. Do not share this medicine with others. ?What if I miss a dose? ?If you miss a dose, take it as soon as you can. If it is almost time for your next dose, take only that dose. Do not take double or extra doses. ?What may interact with this medication? ?Do not take this medicine with any of the following medications: ?aspirin and aspirin-like drugs ?mifepristone ?medicines that treat or prevent blood clots like warfarin, enoxaparin, and dalteparin ?palifermin ?protamine ?This medicine may also interact with the following medications: ?dextran ?digoxin ?hydroxychloroquine ?medicines for treating colds or allergies ?nicotine ?NSAIDs, medicines for pain and inflammation, like ibuprofen or naproxen ?phenylbutazone ?tetracycline antibiotics ?This list may not describe all possible interactions. Give your health care provider a list of all the medicines, herbs, non-prescription drugs, or dietary supplements you use. Also tell them if you smoke, drink alcohol, or use illegal drugs. Some items may interact with your medicine. ?What should I watch for while using this medication? ?Visit your healthcare professional for regular checks on your progress. You may need blood work done while you are taking this medicine. Your condition will be monitored carefully while you are receiving this medicine. It is important not to miss any appointments. ?Wear a medical ID bracelet or chain, and carry a card that describes your disease and details   of your medicine and dosage times. ?Notify your doctor or healthcare professional at once if you have cold, blue hands or feet. ?If you are going to need surgery or other procedure, tell your healthcare  professional that you are using this medicine. ?Avoid sports and activities that might cause injury while you are using this medicine. Severe falls or injuries can cause unseen bleeding. Be careful when using sharp tools or knives. Consider using an Copy. Take special care brushing or flossing your teeth. Report any injuries, bruising, or red spots on the skin to your healthcare professional. ?Using this medicine for a long time may weaken your bones and increase the risk of bone fractures. ?You should make sure that you get enough calcium and vitamin D while you are taking this medicine. Discuss the foods you eat and the vitamins you take with your healthcare professional. ?Wear a medical ID bracelet or chain. Carry a card that describes your disease and details of your medicine and dosage times. ?What side effects may I notice from receiving this medication? ?Side effects that you should report to your doctor or health care professional as soon as possible: ?allergic reactions like skin rash, itching or hives, swelling of the face, lips, or tongue ?bone pain ?fever, chills ?nausea, vomiting ?signs and symptoms of bleeding such as bloody or black, tarry stools; red or dark-brown urine; spitting up blood or brown material that looks like coffee grounds; red spots on the skin; unusual bruising or bleeding from the eye, gums, or nose ?signs and symptoms of a blood clot such as chest pain; shortness of breath; pain, swelling, or warmth in the leg ?signs and symptoms of a stroke such as changes in vision; confusion; trouble speaking or understanding; severe headaches; sudden numbness or weakness of the face, arm or leg; trouble walking; dizziness; loss of coordination ?Side effects that usually do not require medical attention (report to your doctor or health care professional if they continue or are bothersome): ?hair loss ?pain, redness, or irritation at site where injected ?This list may not describe all  possible side effects. Call your doctor for medical advice about side effects. You may report side effects to FDA at 1-800-FDA-1088. ?Where should I keep my medication? ?Keep out of the reach of children. ?Store unopened vials at room temperature between 15 and 30 degrees C (59 and 86 degrees F). Do not freeze. Do not use if solution is discolored or particulate matter is present. Throw away any unused medicine after the expiration date. ?NOTE: This sheet is a summary. It may not cover all possible information. If you have questions about this medicine, talk to your doctor, pharmacist, or health care provider. ?? 2022 Elsevier/Gold Standard (2020-12-05 00:00:00) ? ?

## 2022-02-23 ENCOUNTER — Ambulatory Visit (INDEPENDENT_AMBULATORY_CARE_PROVIDER_SITE_OTHER): Payer: No Typology Code available for payment source

## 2022-02-23 VITALS — Ht 70.0 in | Wt 185.0 lb

## 2022-02-23 DIAGNOSIS — Z0000 Encounter for general adult medical examination without abnormal findings: Secondary | ICD-10-CM

## 2022-02-23 NOTE — Patient Instructions (Signed)
Shane [name] , ?Thank you for taking time to come for your Medicare Wellness Visit. I appreciate your ongoing commitment to your health goals. Please review the following plan we discussed and let me know if I can assist you in the future.  ? ?Screening recommendations/referrals: ?Colonoscopy: n/a ?Recommended yearly ophthalmology/optometry visit for glaucoma screening and checkup ?Recommended yearly dental visit for hygiene and checkup ? ?Vaccinations: ?Influenza vaccine: due 06/09/2022 ?Pneumococcal vaccine: completed 10/23/2016 ?Tdap vaccine: completed 08/27/2015, due 08/26/2025 ?Shingles vaccine: completed   ?Covid-19:  09/05/2021, 08/15/2020, 01/04/2020, 12/14/2019 ? ?Advanced directives: Advance directive discussed with you today.  ? ?Conditions/risks identified: none ? ?Next appointment: Follow up in one year for your annual wellness visit.  ? ?Preventive Care 12 Years and Older, Male ?Preventive care refers to lifestyle choices and visits with your health care provider that can promote health and wellness. ?What does preventive care include? ?A yearly physical exam. This is also called an annual well check. ?Dental exams once or twice a year. ?Routine eye exams. Ask your health care provider how often you should have your eyes checked. ?Personal lifestyle choices, including: ?Daily care of your teeth and gums. ?Regular physical activity. ?Eating a healthy diet. ?Avoiding tobacco and drug use. ?Limiting alcohol use. ?Practicing safe sex. ?Taking low doses of aspirin every day. ?Taking vitamin and mineral supplements as recommended by your health care provider. ?What happens during an annual well check? ?The services and screenings done by your health care provider during your annual well check will depend on your age, overall health, lifestyle risk factors, and family history of disease. ?Counseling  ?Your health care provider may ask you questions about your: ?Alcohol use. ?Tobacco use. ?Drug use. ?Emotional  well-being. ?Home and relationship well-being. ?Sexual activity. ?Eating habits. ?History of falls. ?Memory and ability to understand (cognition). ?Work and work Statistician. ?Screening  ?You may have the following tests or measurements: ?Height, weight, and BMI. ?Blood pressure. ?Lipid and cholesterol levels. These may be checked every 5 years, or more frequently if you are over 74 years old. ?Skin check. ?Lung cancer screening. You may have this screening every year starting at age 34 if you have a 30-pack-year history of smoking and currently smoke or have quit within the past 15 years. ?Fecal occult blood test (FOBT) of the stool. You may have this test every year starting at age 84. ?Flexible sigmoidoscopy or colonoscopy. You may have a sigmoidoscopy every 5 years or a colonoscopy every 10 years starting at age 50. ?Prostate cancer screening. Recommendations will vary depending on your family history and other risks. ?Hepatitis C blood test. ?Hepatitis B blood test. ?Sexually transmitted disease (STD) testing. ?Diabetes screening. This is done by checking your blood sugar (glucose) after you have not eaten for a while (fasting). You may have this done every 1-3 years. ?Abdominal aortic aneurysm (AAA) screening. You may need this if you are a current or former smoker. ?Osteoporosis. You may be screened starting at age 71 if you are at high risk. ?Talk with your health care provider about your test results, treatment options, and if necessary, the need for more tests. ?Vaccines  ?Your health care provider may recommend certain vaccines, such as: ?Influenza vaccine. This is recommended every year. ?Tetanus, diphtheria, and acellular pertussis (Tdap, Td) vaccine. You may need a Td booster every 10 years. ?Zoster vaccine. You may need this after age 58. ?Pneumococcal 13-valent conjugate (PCV13) vaccine. One dose is recommended after age 27. ?Pneumococcal polysaccharide (PPSV23) vaccine. One dose is recommended  after  age 30. ?Talk to your health care provider about which screenings and vaccines you need and how often you need them. ?This information is not intended to replace advice given to you by your health care provider. Make sure you discuss any questions you have with your health care provider. ?Document Released: 11/22/2015 Document Revised: 07/15/2016 Document Reviewed: 08/27/2015 ?Elsevier Interactive Patient Education ? 2017 [place]. ? ?Fall Prevention in the Home ?Falls can cause injuries. They can happen to people of all ages. There are many things you can do to make your home safe and to help prevent falls. ?What can I do on the outside of my home? ?Regularly fix the edges of walkways and driveways and fix any cracks. ?Remove anything that might make you trip as you walk through a door, such as a raised step or threshold. ?Trim any bushes or trees on the path to your home. ?Use bright outdoor lighting. ?Clear any walking paths of anything that might make someone trip, such as rocks or tools. ?Regularly check to see if handrails are loose or broken. Make sure that both sides of any steps have handrails. ?Any raised decks and porches should have guardrails on the edges. ?Have any leaves, snow, or ice cleared regularly. ?Use sand or salt on walking paths during winter. ?Clean up any spills in your garage right away. This includes oil or grease spills. ?What can I do in the bathroom? ?Use night lights. ?Install grab bars by the toilet and in the tub and shower. Do not use towel bars as grab bars. ?Use non-skid mats or decals in the tub or shower. ?If you need to sit down in the shower, use a plastic, non-slip stool. ?Keep the floor dry. Clean up any water that spills on the floor as soon as it happens. ?Remove soap buildup in the tub or shower regularly. ?Attach bath mats securely with double-sided non-slip rug tape. ?Do not have throw rugs and other things on the floor that can make you trip. ?What can I do in the  bedroom? ?Use night lights. ?Make sure that you have a light by your bed that is easy to reach. ?Do not use any sheets or blankets that are too big for your bed. They should not hang down onto the floor. ?Have a firm chair that has side arms. You can use this for support while you get dressed. ?Do not have throw rugs and other things on the floor that can make you trip. ?What can I do in the kitchen? ?Clean up any spills right away. ?Avoid walking on wet floors. ?Keep items that you use a lot in easy-to-reach places. ?If you need to reach something above you, use a strong step stool that has a grab bar. ?Keep electrical cords out of the way. ?Do not use floor polish or wax that makes floors slippery. If you must use wax, use non-skid floor wax. ?Do not have throw rugs and other things on the floor that can make you trip. ?What can I do with my stairs? ?Do not leave any items on the stairs. ?Make sure that there are handrails on both sides of the stairs and use them. Fix handrails that are broken or loose. Make sure that handrails are as long as the stairways. ?Check any carpeting to make sure that it is firmly attached to the stairs. Fix any carpet that is loose or worn. ?Avoid having throw rugs at the top or bottom of the stairs. If you  do have throw rugs, attach them to the floor with carpet tape. ?Make sure that you have a light switch at the top of the stairs and the bottom of the stairs. If you do not have them, ask someone to add them for you. ?What else can I do to help prevent falls? ?Wear shoes that: ?Do not have high heels. ?Have rubber bottoms. ?Are comfortable and fit you well. ?Are closed at the toe. Do not wear sandals. ?If you use a stepladder: ?Make sure that it is fully opened. Do not climb a closed stepladder. ?Make sure that both sides of the stepladder are locked into place. ?Ask someone to hold it for you, if possible. ?Clearly mark and make sure that you can see: ?Any grab bars or  handrails. ?First and last steps. ?Where the edge of each step is. ?Use tools that help you move around (mobility aids) if they are needed. These include: ?Canes. ?Walkers. ?Scooters. ?Crutches. ?Turn on the lights wh

## 2022-02-23 NOTE — Progress Notes (Signed)
?I connected with Shane [name] today by telephone and verified that I am speaking with the correct person using two identifiers. ?Location patient: home ?Location provider: work ?Persons participating in the virtual visit: Shane [name], Shane [name]. ?  ?I discussed the limitations, risks, security and privacy concerns of performing an evaluation and management service by telephone and the availability of in person appointments. I also discussed with the patient that there may be a patient responsible charge related to this service. The patient expressed understanding and verbally consented to this telephonic visit.  ?  ?Interactive audio and video telecommunications were attempted between this provider and patient, however failed, due to patient having technical difficulties OR patient did not have access to video capability.  We continued and completed visit with audio only. ? ?  ? ?Vital signs may be patient reported or missing. ? ?Subjective:  ? Shane Thoma. is a 71 y.o. male who presents for an Initial Medicare Annual Wellness Visit. ? ?Review of Systems    ? ?Cardiac Risk Factors include: advanced age (>72mn, >>68women);male gender ? ?   ?Objective:  ?  ?Today's Vitals  ? 02/23/22 1341  ?Weight: 185 lb (83.9 kg)  ?Height: '5\' 10"'$  (1.778 m)  ? ?Body mass index is 26.54 kg/m?. ? ? ?  02/23/2022  ?  1:46 PM 02/19/2022  ? 10:23 AM 12/26/2021  ? 10:15 AM 10/28/2021  ? 10:12 AM 07/04/2021  ? 12:11 PM 03/05/2021  ?  7:46 AM 02/26/2021  ? 10:21 AM  ?Advanced Directives  ?Does Patient Have a Medical Advance Directive? No No No No No No No  ?Would patient like information on creating a medical advance directive?   No - Patient declined No - Patient declined No - Patient declined No - Patient declined No - Patient declined  ? ? ?Current Medications (verified) ?Outpatient Encounter Medications as of 02/23/2022  ?Medication Sig  ? furosemide (LASIX) 20 MG tablet TAKE 1 TABLET(20 MG) BY MOUTH DAILY AS  NEEDED FOR SWELLING  ? pravastatin (PRAVACHOL) 20 MG tablet Take 20 mg by mouth daily.  ? valsartan (DIOVAN) 80 MG tablet Take 1 tablet (80 mg total) by mouth daily. (Patient not taking: Reported on 02/23/2022)  ? ?No facility-administered encounter medications on file as of 02/23/2022.  ? ? ?Allergies (verified) ?Patient has no active allergies.  ? ?History: ?Past Medical History:  ?Diagnosis Date  ? Allergy   ? Arthritis   ? knee  ? Rectal cancer ([facility] 08/2020  ? ?Past Surgical History:  ?Procedure Laterality Date  ? ESOPHAGOGASTRODUODENOSCOPY (EGD) WITH PROPOFOL N/A 03/14/2021  ? Procedure: ESOPHAGOGASTRODUODENOSCOPY (EGD) WITH PROPOFOL;  Surgeon: CLavena Bullion DO;  Location: [facility];  Service: Gastroenterology;  Laterality: N/A;  ? IR CATHETER TUBE CHANGE  07/29/2021  ? IR IMAGING GUIDED PORT INSERTION  07/25/2020  ? IR RADIOLOGIST EVAL & MGMT  04/22/2021  ? IR RADIOLOGIST EVAL & MGMT  05/07/2021  ? IR RADIOLOGIST EVAL & MGMT  07/24/2021  ? IR RADIOLOGIST EVAL & MGMT  09/17/2021  ? IR SINUS/FIST TUBE CHK-NON GI  04/10/2021  ? IR SINUS/FIST TUBE CHK-NON GI  04/10/2021  ? IR UK[place]GUIDE BX ASP/DRAIN  03/11/2021  ? OSTOMY N/A 03/05/2021  ? Procedure: OSTOMY;  Surgeon: TLeighton Ruff MD;  Location: [facility];  Service: General;  Laterality: N/A;  ? WISDOM TOOTH EXTRACTION    ? XI ROBOTIC ASSISTED LOWER ANTERIOR RESECTION N/A 03/05/2021  ? Procedure: XI ROBOTIC ASSISTED LOWER ANTERIOR  RESECTION WITH MOBILIZATION OF THE SPLENIC FLEXURE OF THE COLON AND END COLOSTOMY;  Surgeon: Leighton Ruff, MD;  Location: [facility];  Service: General;  Laterality: N/A;  6 HOURS  ? ?Family History  ?Problem Relation Age of Onset  ? Healthy Mother   ? Hypertension Father   ? Stroke Father   ? Colon cancer Neg Hx   ? Esophageal cancer Neg Hx   ? Stomach cancer Neg Hx   ? Rectal cancer Neg Hx   ? ?Social History  ? ?Socioeconomic History  ? Marital status: Married  ?  Spouse name: Not on file  ? Number of children: 2  ? Years of education: 24  ?  Highest education level: Not on file  ?Occupational History  ? Occupation: Armed forces technical officer  ?  Comment: retired  ?Tobacco Use  ? Smoking status: Former  ?  Packs/day: 1.00  ?  Years: 10.00  ?  Pack years: 10.00  ?  Types: Cigarettes  ?  Quit date: 08/09/1978  ?  Years since quitting: 43.5  ? Smokeless tobacco: Never  ?Vaping Use  ? Vaping Use: Never used  ?Substance and Sexual Activity  ? Alcohol use: No  ?  Alcohol/week: 0.0 standard drinks  ? Drug use: No  ? Sexual activity: Not Currently  ?Other Topics Concern  ? Not on file  ?Social History Narrative  ? Fun: [name] out with his grandkids, 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  ? ?Social Determinants of Health  ? ?Financial Resource Strain: Low Risk   ? Difficulty of Paying Living Expenses: Not hard at all  ?Food Insecurity: No Food Insecurity  ? Worried About Charity fundraiser in the Last Year: Never true  ? Ran Out of Food in the Last Year: Never true  ?Transportation Needs: No Transportation Needs  ? Lack of Transportation (Medical): No  ? Lack of Transportation (Non-Medical): No  ?Physical Activity: Sufficiently Active  ? Days of Exercise per Week: 5 days  ? Minutes of Exercise per Session: 90 min  ?Stress: No Stress Concern Present  ? Feeling of Stress : Not at all  ?Social Connections: Not on file  ? ? ?Tobacco Counseling ?Counseling given: Not Answered ? ? ?Clinical Intake: ? ?Pre-visit preparation completed: Yes ? ?Pain : No/denies pain ? ?  ? ?Nutritional Status: BMI 25 -29 Overweight ?Nutritional Risks: None ?Diabetes: No ? ?How often do you need to have someone help you when you read instructions, pamphlets, or other written materials from your doctor or pharmacy?: 1 - Never ?What is the last grade level you completed in school?: associates degree ? ?Diabetic? no ? ?Interpreter Needed?: No ? ?Information entered by :: NAllen [name] ? ? ?Activities of Daily Living ? ?  02/23/2022  ?  1:48 PM 03/06/2021  ?  6:09 AM  ?In your present state of health, do you have any  difficulty performing the following activities:  ?Hearing? 0   ?Vision? 0   ?Difficulty concentrating or making decisions? 0   ?Walking or climbing stairs? 0   ?Dressing or bathing? 0   ?Doing errands, shopping? 0 0  ?Preparing Food and eating ? N   ?Using the Toilet? N   ?In the past six months, have you accidently leaked urine? N   ?Do you have problems with loss of bowel control? N   ?Managing your Medications? N   ?Managing your Finances? N   ?Housekeeping or managing your Housekeeping? N   ? ? ?Patient Care Team: ?Valere Dross,  [name], FNP as PCP - General (Internal Medicine) ?Ladell Pier, MD as Consulting Physician (Oncology) ?Heilingoetter, Tobe Sos, PA-C as Librarian, academic (Physician Environmental consultant) ?Leighton Ruff, MD as Consulting Physician (General Surgery) ?Izora Gala, MD as Consulting Physician (Otolaryngology) ?Lavena Bullion, DO as Consulting Physician (Gastroenterology) ? ?Indicate any recent Medical Services you may have received from other than [facility] providers in the past year (date may be approximate). ? ?   ?Assessment:  ? This is a routine wellness examination for [place]. ? ?Hearing/Vision screen ?Vision Screening - Comments:: Regular eye exams, [facility] ? ?Dietary issues and exercise activities discussed: ?Current Exercise Habits: Home exercise routine, Type of exercise: calisthenics;strength training/weights, Frequency (Times/Week): 5 ? ? Goals Addressed   ? ?  ?  ?  ?  ? This Visit's Progress  ?  Patient Stated     ?  02/23/2022, continue exercise and strength training  ?  ? ?  ? ?Depression Screen ? ?  02/23/2022  ?  1:47 PM 01/20/2022  ?  9:48 AM 05/20/2021  ? 11:34 AM 08/30/2019  ? 11:12 AM 08/22/2018  ?  8:42 AM 10/22/2016  ?  2:36 PM  ?PHQ 2/9 Scores  ?PHQ - 2 Score 0 0 1 0 0 0  ?  ?Fall Risk ? ?  02/23/2022  ?  1:47 PM 01/20/2022  ?  9:48 AM 07/11/2021  ? 11:12 AM 05/20/2021  ? 11:34 AM 06/24/2020  ?  1:42 PM  ?Fall Risk   ?Falls in the past year? 0 0 0 0 0  ?Number falls in past yr:  0 0 0 0   ?Injury with Fall? 0 0 0 0   ?Risk for fall due to :   No Fall Risks    ?Follow up Falls evaluation completed;Education provided;Falls prevention discussed  Falls evaluation completed Falls evaluation compl

## 2022-03-10 ENCOUNTER — Telehealth: Payer: Self-pay | Admitting: Family

## 2022-03-10 DIAGNOSIS — M1711 Unilateral primary osteoarthritis, right knee: Secondary | ICD-10-CM | POA: Diagnosis not present

## 2022-03-10 DIAGNOSIS — M25512 Pain in left shoulder: Secondary | ICD-10-CM | POA: Diagnosis not present

## 2022-03-10 NOTE — Telephone Encounter (Signed)
Can you call and check on him regarding his blood pressure? I noticed he told nurse at [facility] that he had discontinued Valsartan.  ?

## 2022-03-10 NOTE — Telephone Encounter (Signed)
Pt stated that he has stopped his medication and his blood pressure has been ranging under 130/ 80 with exercise. He 1.5 hrs M-F at the [facility]. He has his blood pressure is doing better with the exercise.  ?

## 2022-04-16 ENCOUNTER — Inpatient Hospital Stay: Payer: No Typology Code available for payment source

## 2022-04-16 ENCOUNTER — Inpatient Hospital Stay: Payer: No Typology Code available for payment source | Attending: Physician Assistant

## 2022-04-16 ENCOUNTER — Inpatient Hospital Stay (HOSPITAL_BASED_OUTPATIENT_CLINIC_OR_DEPARTMENT_OTHER): Payer: No Typology Code available for payment source | Admitting: Oncology

## 2022-04-16 ENCOUNTER — Other Ambulatory Visit: Payer: Self-pay | Admitting: *Deleted

## 2022-04-16 ENCOUNTER — Other Ambulatory Visit: Payer: Self-pay

## 2022-04-16 ENCOUNTER — Encounter: Payer: Self-pay | Admitting: *Deleted

## 2022-04-16 VITALS — BP 153/57 | HR 73 | Temp 98.0°F | Resp 20 | Wt 191.0 lb

## 2022-04-16 DIAGNOSIS — C20 Malignant neoplasm of rectum: Secondary | ICD-10-CM | POA: Insufficient documentation

## 2022-04-16 DIAGNOSIS — Z95828 Presence of other vascular implants and grafts: Secondary | ICD-10-CM

## 2022-04-16 LAB — CEA (ACCESS): CEA (CHCC): 1.82 ng/mL (ref 0.00–5.00)

## 2022-04-16 MED ORDER — HEPARIN SOD (PORK) LOCK FLUSH 100 UNIT/ML IV SOLN
500.0000 [IU] | Freq: Once | INTRAVENOUS | Status: AC
  Administered 2022-04-16: 500 [IU]

## 2022-04-16 MED ORDER — SODIUM CHLORIDE 0.9% FLUSH
10.0000 mL | Freq: Once | INTRAVENOUS | Status: AC
  Administered 2022-04-16: 10 mL

## 2022-04-16 NOTE — Progress Notes (Signed)
PATIENT NAVIGATOR PROGRESS NOTE  Name: Khadar Monger. Date: 04/16/2022 MRN: [id number]  DOB: [birth year]   Reason for visit: Ostomy supplies  Comments:  Met with Mr Brancato during visit with Dr Benay Spice and have enrolled him in [facility] program for ostomy supplies.     Time spent counseling/coordinating care: 15-30 minutes

## 2022-04-16 NOTE — Progress Notes (Signed)
[facility] OFFICE PROGRESS NOTE   Diagnosis: Rectal cancer  INTERVAL HISTORY:   Shane [name] returns as scheduled.  He feels well.  He is exercising.  Good appetite.  He continues to have rectal drainage.  Mild peripheral numbness persists.  Objective:  Vital signs in last 24 hours:  Blood pressure (!) 153/57, pulse 73, temperature 98 F (36.7 C), temperature source Oral, resp. rate 20, weight 191 lb (86.6 kg), SpO2 99 %.   Lymphatics: No cervical, supraclavicular, axillary, or inguinal nodes Resp: Lungs clear bilaterally Cardio: Regular rate and rhythm GI: No hepatosplenomegaly, nontender, left lower quadrant colostomy Vascular: No leg edema  Skin: Healed gluteal scars  Portacath/PICC-without erythema  Lab Results:  Lab Results  Component Value Date   WBC 3.7 (L) 01/20/2022   HGB 13.4 01/20/2022   HCT 40.6 01/20/2022   MCV 91.6 01/20/2022   PLT 241.0 01/20/2022   NEUTROABS 2.2 01/20/2022    CMP  Lab Results  Component Value Date   NA 138 01/20/2022   K 4.3 01/20/2022   CL 104 01/20/2022   CO2 29 01/20/2022   GLUCOSE 87 01/20/2022   BUN 15 01/20/2022   CREATININE 0.86 01/20/2022   CALCIUM 9.6 01/20/2022   PROT 6.9 01/20/2022   ALBUMIN 4.1 01/20/2022   AST 12 01/20/2022   ALT 9 01/20/2022   ALKPHOS 126 (H) 01/20/2022   BILITOT 0.7 01/20/2022   GFRNONAA >60 10/28/2021   GFRAA >60 07/15/2020    Lab Results  Component Value Date   CEA1 12.99 (H) 01/10/2021   CEA 1.82 04/16/2022      Medications: I have reviewed the patient's current medications.   Assessment/Plan: Rectal cancer CT abdomen/pelvis 06/27/2020-irregular masslike thickening of the rectum, increased colonic stool, perirectal infiltration into the presacral space, tiny bibasilar pulmonary nodules CT chest 07/12/2020-multiple small pulmonary nodules, largest 3 mm in the left apex MRI pelvis 07/12/2020-tumor at 11.4 centimeters from the anal verge, 6.6 cm from the internal  sphincter, tumor extends through the right lateral rectal wall and directly involves an adjacent loop of distal sigmoid colon with invasion of anterior peritoneal reflection and involvement of the right seminal vesicle, no lymphadenopathy.  T4N0 Colonoscopy 07/17/2020-mass at 12 cm from the anal verge, completely obstructing mass at 23 cm from the anal verge, large internal hemorrhoids, biopsy confirmed adenocarcinoma Cycle 1 FOLFOX 07/30/2020 Cycle 2 FOLFOX 08/13/2020 Cycle 3 FOLFOX 08/27/2020 Cycle 4 FOLFOX 09/09/2020 Cycle 5 FOLFOX 09/24/2020, 5-FU bolus held, oxaliplatin dose reduced secondary to neutropenia Cycle 6 FOLFOX 10/07/2020, 5-FU bolus and full dose oxaliplatin, Neulasta Cycle 7 FOLFOX 10/21/2020, Neulasta CTs 10/31/2020-no change in rectal mass, no evidence of nodal metastases, unchanged nonspecific lung nodules Cycle 8 FOLFOX 11/04/2020 Radiation/Xeloda  11/27/2020-01/03/2021 Low anterior resection 03/05/2021-residual moderately differentiated adenocarcinoma, treatment response score 2, tumor extends to the sigmoid colon, 26 negative lymph nodes, ypT4bypN0, negative resection margins, no loss of mismatch repair protein expression, MSI stable   2.  Iron deficiency anemia secondary to #1 3.  Hemorrhoids 4.  Rectal pain secondary to hemorrhoids versus the primary rectal tumor 5.  Constipation secondary to #1, improved with a stool softener and laxative regimen 6.  Small indeterminate pulmonary nodules 7.  Arthritis 8.  Neutropenia secondary to chemotherapy 9.  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  He will return for an office visit and surveillance images in November.  The Port-A-Cath remains in place.  He will return for a Port-A-Cath flush in 6 weeks and 12 weeks.  We will give him information to apply for insurance assistance with  ostomy supplies.    Betsy Coder, MD  04/16/2022  10:59 AM

## 2022-04-16 NOTE — Progress Notes (Signed)
Referral to Dr Bryan Lemma for repeat colonoscopy entered

## 2022-04-17 ENCOUNTER — Telehealth: Payer: Self-pay

## 2022-04-17 NOTE — Telephone Encounter (Signed)
-----   Message from Ladell Pier, MD sent at 04/16/2022  6:45 PM EDT ----- Please call patient, the CEA is normal, follow-up as scheduled

## 2022-04-17 NOTE — Telephone Encounter (Signed)
Pt verbalized understanding.

## 2022-04-21 DIAGNOSIS — K631 Perforation of intestine (nontraumatic): Secondary | ICD-10-CM | POA: Diagnosis not present

## 2022-04-21 DIAGNOSIS — Z933 Colostomy status: Secondary | ICD-10-CM | POA: Diagnosis not present

## 2022-04-27 ENCOUNTER — Telehealth: Payer: Self-pay | Admitting: *Deleted

## 2022-04-27 ENCOUNTER — Telehealth: Payer: Self-pay

## 2022-04-27 NOTE — Telephone Encounter (Signed)
Scheduled appointment with Dr. Bryan Lemma on 06/08/22 at 9:00 am. Advised patient come 10 minutes early.

## 2022-04-27 NOTE — Telephone Encounter (Signed)
-----   Message from Ladell Pier, MD sent at 04/25/2022  2:11 PM EDT ----- Regarding: FW:  Please ask him to schedule an appt with Dr Marcello Moores, if he wants to discuss options for decreasing the rectal drainage ----- Message ----- From: Leighton Ruff, MD Sent: 05/11/7105   1:30 PM EDT To: Ladell Pier, MD Subject: RE:                                            Probably nothing easy.  Happy to take a look, if he wants to come see me.  Elmo Putt ----- Message ----- From: Ladell Pier, MD Sent: 04/16/2022   2:69 PM EDT To: Leighton Ruff, MD  I saw him today, he looks great, gaining weight and exercising  He continues to have large volume drainage from the rectum.  The colostomy is functioning well.  Is there anything he can do to decrease the rectal output?   Thanks,  [employer]

## 2022-04-27 NOTE — Telephone Encounter (Signed)
-----   Message from Marice Potter, RN sent at 04/27/2022  3:15 PM EDT ----- Regarding: FW: Dr Bryan Lemma pt  ----- Message ----- From: Lavena Bullion, DO Sent: 04/24/2022   1:19 PM EDT To: Lin Givens, RN; Howell Pringle, CMA; # Subject: RE: Dr Bryan Lemma pt                           Thank you for reaching out. Will get him in and taken care of.  Bernese Doffing, Can you please schedule OV with me and will discuss setting up colonoscopy (via ostomy). Thank you.    ----- Message ----- From: Lin Givens, RN Sent: 04/24/2022  10:25 AM EDT To: Lavena Bullion, DO; Marice Potter, RN Subject: Dr Bryan Lemma pt                               Good morning,  This is a mutual patient with colorectal cancer S/P surgery and chemo, he was due for 1 year colonoscopy in April.  I sent a referral to your office and wanted your team to be aware.  Please let me know if you need any additional information.  Thank you,  Royston Sinner RN [facility] Nurse Navigator [phone]

## 2022-04-27 NOTE — Telephone Encounter (Signed)
Notified Shane [name] that Shane [name] recommends he reach out to Shane [name] to discuss options for decreasing the rectal drainage. He understands and agrees.

## 2022-04-29 IMAGING — RF DG SINUS / FISTULA TRACT / ABSCESSOGRAM
2 series · 6 of 6 positions shown · non-contrast
Comparison: CT abdomen pelvis-earlier same day;

CLINICAL DATA: History of rectal cancer, [facility] with end
colostomy complicated by development of a postoperative abscess post
multiple image guided percutaneous drainage catheters with remaining
right transgluteal approach drainage catheter placement on
04/03/2021, subsequently exchanged and upsized on 07/29/2021.
TECHNIQUE: The patient was positioned prone on the fluoroscopy table.

[Series 1: sequence · 4 of 60 frames shown]
[frame 10/60]
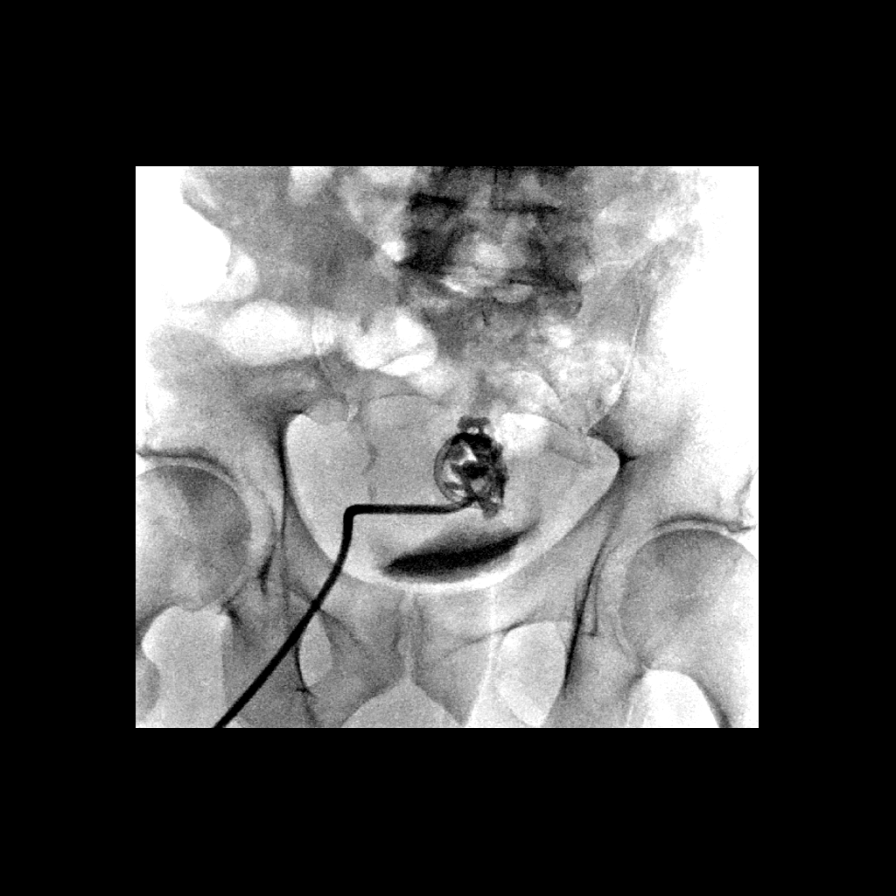
[frame 31/60]
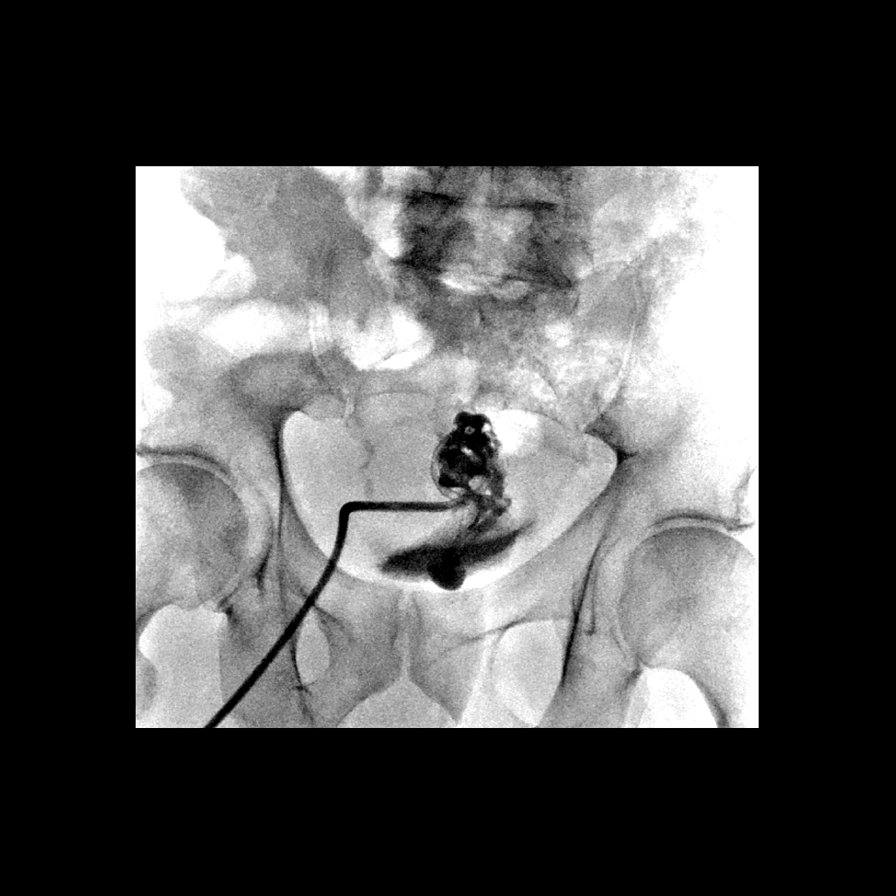
[frame 52/60]
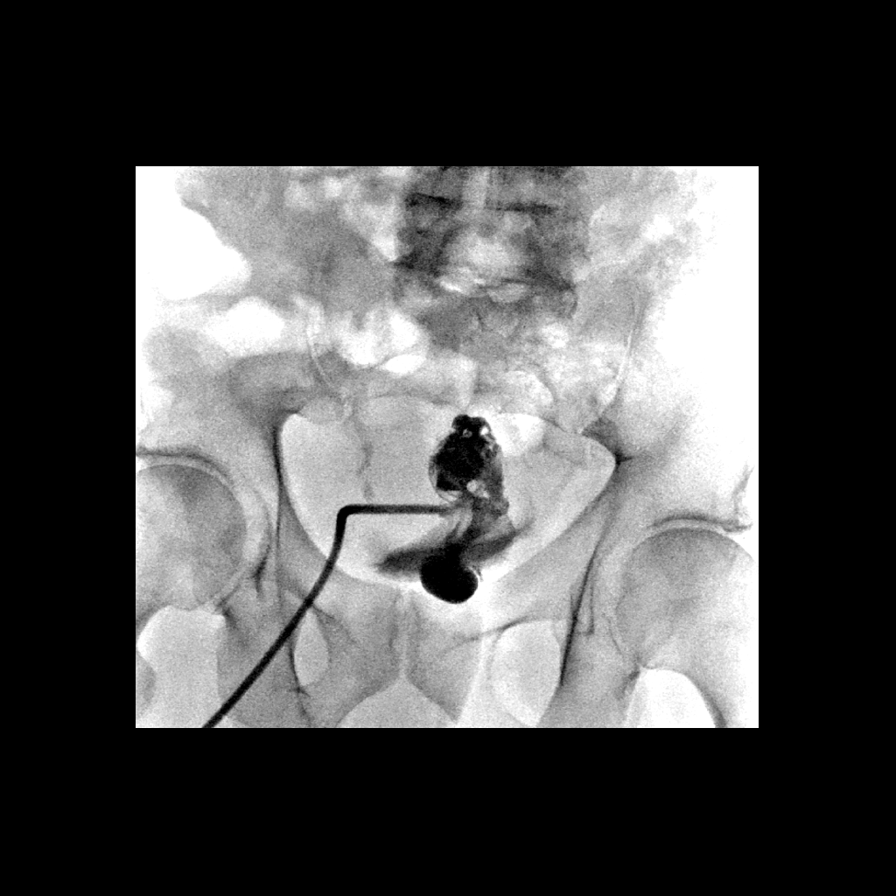
[frame 55/60]
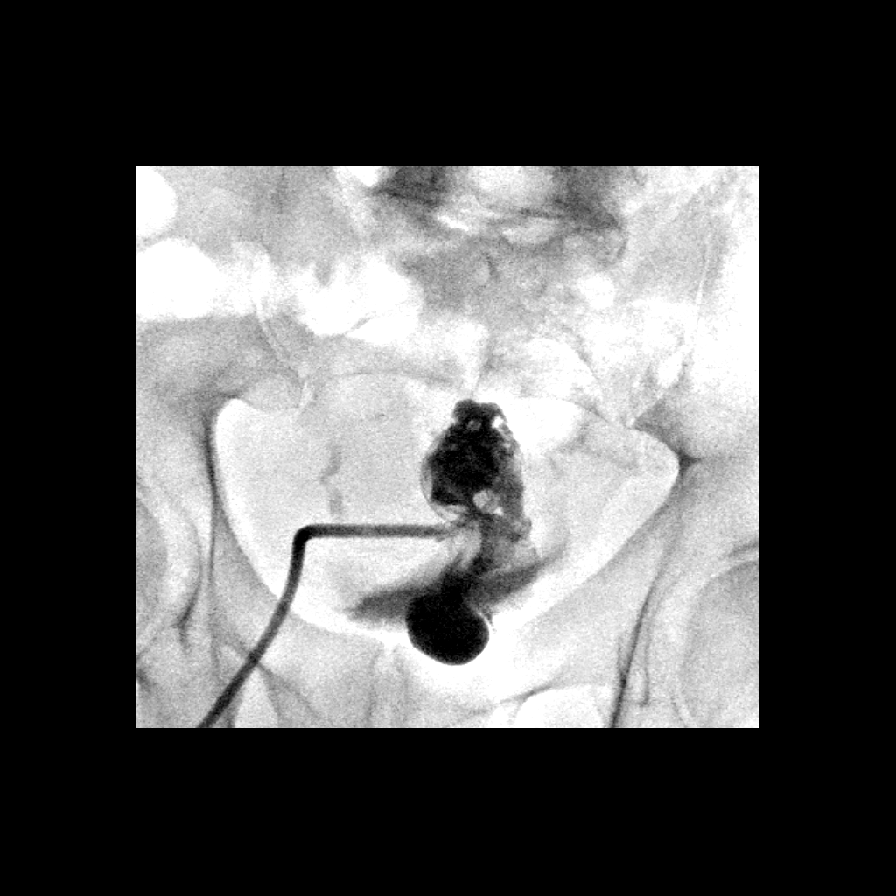

[Series 2: one shot · 2 of 2 slices shown]
[im 1/2]
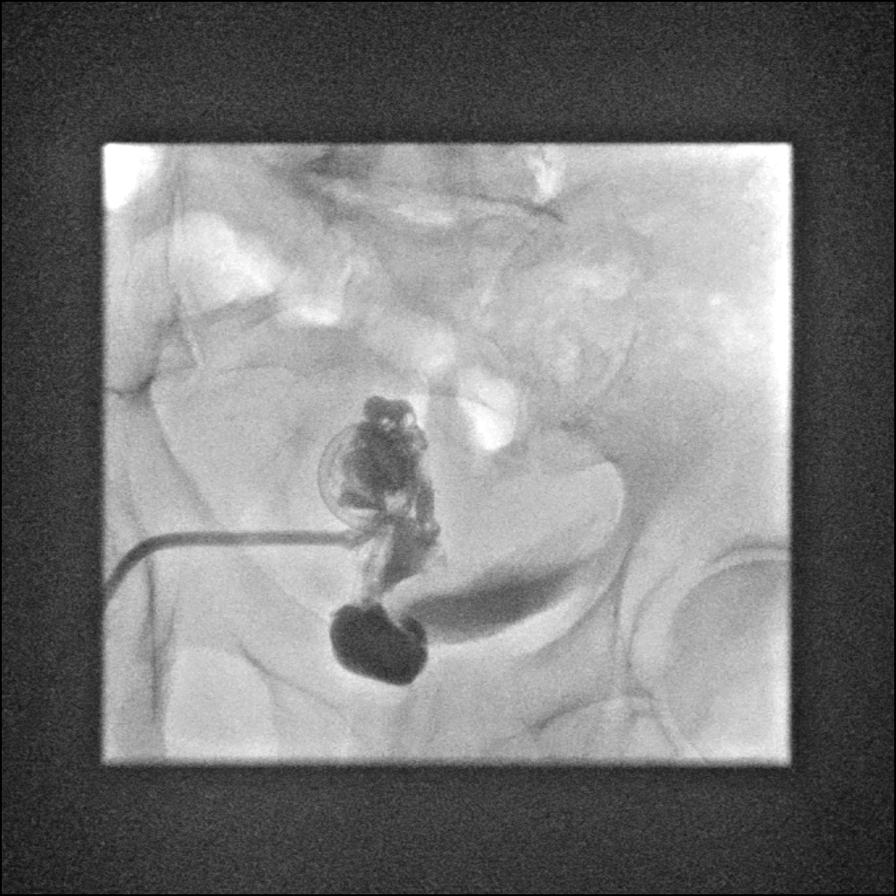
[im 2/2]
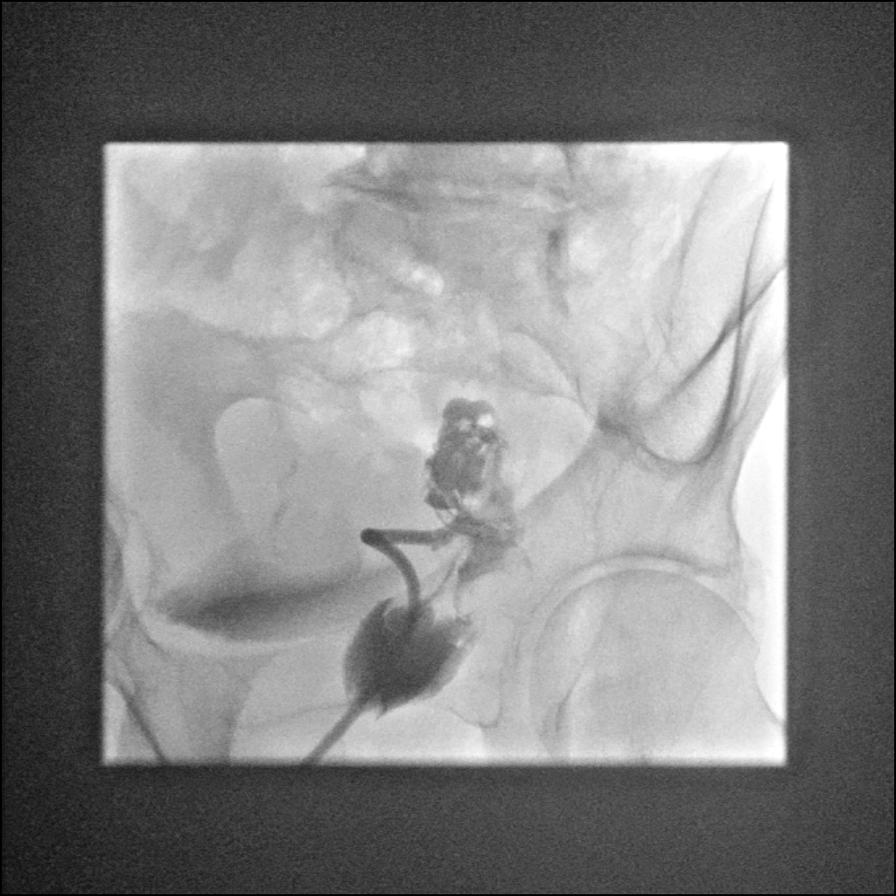

[6 of 6 positions shown; findings below may reference images not displayed]

The patient continues to flush the percutaneous drainage catheter
twice per day. He reports approximately 5 cc of output from the
drainage cathete. He is not presently on antibiotics.

He denies worsening abdominal or pelvic pain.

Preceding CT scan of the abdomen and pelvis performed earlier today
demonstrates stable positioning of left trans gluteal approach
percutaneous drainage catheter with slight reduction in size of
persistent peripherally enhancing presacral fluid collection, now
measuring 3.6 cm in maximal diameter, previously, 4.1 cm, again
appearing to demonstrate communication with the rectal stump.

Patient now presents for drainage catheter injection.

EXAM:
ABSCESS INJECTION
07/14/2021;
drainage catheter injection-07/24/2021

Drainage catheter exchange and up sizing-07/29/2021

CT-guided left trans gluteal approach percutaneous drainage catheter
placement-04/03/2021

CONTRAST:  15 cc 6sovue-LCC - administered via the existing
percutaneous drain.

FLUOROSCOPY TIME:  24 seconds (5 mGy)
A preprocedural spot fluoroscopic image was obtained of the lower
pelvis and the existing percutaneous drainage catheter.

Multiple spot fluoroscopic and radiographic images were obtained
following the injection of a small amount of contrast via the
existing percutaneous drainage catheter.

Images reviewed and discussed with the patient as well as the
referring physician the decision was made to remove the percutaneous
drainage catheter.

The external portion of the drainage catheter was cut and drainage
catheter was removed intact. A dressing was applied. The patient
tolerated the procedure well without immediate postprocedural
complication.
FINDINGS: Preprocedural spot fluoroscopic image demonstrates unchanged
positioning of the previous drainage catheter with end coiled and
locked overlying the midline of the pelvis. Excreted contrast is
seen with the urinary bladder

Contrast injection demonstrates opacification of the decompressed
abscess cavity with fistulous connection between the decompressed
abscess cavity and the residual rectal stump, similar to previous
drainage catheter injection performed 07/24/2021.
IMPRESSION: Drainage catheter injection confirms persistent fistulous connection
between the decompressed abscess cavity and rectal stump, similar to
previous drain injection performed 07/24/2021.

As preceding abdominal CT demonstrates slight reduction in size of
persistent peripherally enhancing fluid collection and as the
patient reports little to no output from the drainage catheter for
the past several days and continues to report minimal intermittent
output from his residual rectum decision was made to proceed with
removal of the drainage catheter which has been in place since
04/03/2021.

Above was discussed and agreed upon by referring surgeon, Dr.
Salva.

PLAN:
The patient was encouraged to maintain all follow-up appointments
with Dr. Salva but may otherwise follow-up at the [REDACTED] on a p.r.n. basis.

## 2022-05-28 ENCOUNTER — Inpatient Hospital Stay: Payer: No Typology Code available for payment source | Attending: Physician Assistant

## 2022-05-28 VITALS — BP 151/60 | HR 69 | Temp 98.6°F | Resp 18 | Ht 70.0 in | Wt 193.5 lb

## 2022-05-28 DIAGNOSIS — C20 Malignant neoplasm of rectum: Secondary | ICD-10-CM | POA: Insufficient documentation

## 2022-05-28 DIAGNOSIS — Z95828 Presence of other vascular implants and grafts: Secondary | ICD-10-CM

## 2022-05-28 DIAGNOSIS — Z452 Encounter for adjustment and management of vascular access device: Secondary | ICD-10-CM | POA: Diagnosis not present

## 2022-05-28 MED ORDER — HEPARIN SOD (PORK) LOCK FLUSH 100 UNIT/ML IV SOLN
500.0000 [IU] | Freq: Once | INTRAVENOUS | Status: AC
  Administered 2022-05-28: 500 [IU]

## 2022-05-28 MED ORDER — SODIUM CHLORIDE 0.9% FLUSH
10.0000 mL | Freq: Once | INTRAVENOUS | Status: AC
  Administered 2022-05-28: 10 mL

## 2022-05-28 NOTE — Patient Instructions (Signed)
Heparin injection ?What is this medication? ?HEPARIN (HEP a rin) is an anticoagulant. It is used to treat or prevent clots in the veins, arteries, lungs, or heart. It stops clots from forming or getting bigger. This medicine prevents clotting during open-heart surgery, dialysis, or in patients who are confined to bed. ?This medicine may be used for other purposes; ask your health care provider or pharmacist if you have questions. ?COMMON BRAND NAME(S): Hep-Lock, Hep-Lock U/P, Hepflush-10, Monoject Prefill Advanced Heparin Lock Flush, SASH Normal Saline and Heparin ?What should I tell my care team before I take this medication? ?They need to know if you have any of these conditions: ?bleeding disorders, such as hemophilia or low blood platelets ?bowel disease or diverticulitis ?endocarditis ?high blood pressure ?liver disease ?recent surgery or delivery of a baby ?stomach ulcers ?an unusual or allergic reaction to heparin, benzyl alcohol, sulfites, other medicines, foods, dyes, or preservatives ?pregnant or trying to get pregnant ?breast-feeding ?How should I use this medication? ?This medicine is given by injection or infusion into a vein. It can also be given by injection of small amounts under the skin. It is usually given by a health care professional in a hospital or clinic setting. ?If you get this medicine at home, you will be taught how to prepare and give this medicine. Use exactly as directed. Take your medicine at regular intervals. Do not take it more often than directed. Do not stop taking except on your doctor's advice. Stopping this medicine may increase your risk of a blot clot. Be sure to refill your prescription before you run out of medicine. ?It is important that you put your used needles and syringes in a special sharps container. Do not put them in a trash can. If you do not have a sharps container, call your pharmacist or healthcare provider to get one. ?Talk to your pediatrician regarding the  use of this medicine in children. While this medicine may be prescribed for children for selected conditions, precautions do apply. ?Overdosage: If you think you have taken too much of this medicine contact a poison control center or emergency room at once. ?NOTE: This medicine is only for you. Do not share this medicine with others. ?What if I miss a dose? ?If you miss a dose, take it as soon as you can. If it is almost time for your next dose, take only that dose. Do not take double or extra doses. ?What may interact with this medication? ?Do not take this medicine with any of the following medications: ?aspirin and aspirin-like drugs ?mifepristone ?medicines that treat or prevent blood clots like warfarin, enoxaparin, and dalteparin ?palifermin ?protamine ?This medicine may also interact with the following medications: ?dextran ?digoxin ?hydroxychloroquine ?medicines for treating colds or allergies ?nicotine ?NSAIDs, medicines for pain and inflammation, like ibuprofen or naproxen ?phenylbutazone ?tetracycline antibiotics ?This list may not describe all possible interactions. Give your health care provider a list of all the medicines, herbs, non-prescription drugs, or dietary supplements you use. Also tell them if you smoke, drink alcohol, or use illegal drugs. Some items may interact with your medicine. ?What should I watch for while using this medication? ?Visit your healthcare professional for regular checks on your progress. You may need blood work done while you are taking this medicine. Your condition will be monitored carefully while you are receiving this medicine. It is important not to miss any appointments. ?Wear a medical ID bracelet or chain, and carry a card that describes your disease and details   of your medicine and dosage times. ?Notify your doctor or healthcare professional at once if you have cold, blue hands or feet. ?If you are going to need surgery or other procedure, tell your healthcare  professional that you are using this medicine. ?Avoid sports and activities that might cause injury while you are using this medicine. Severe falls or injuries can cause unseen bleeding. Be careful when using sharp tools or knives. Consider using an electric razor. Take special care brushing or flossing your teeth. Report any injuries, bruising, or red spots on the skin to your healthcare professional. ?Using this medicine for a long time may weaken your bones and increase the risk of bone fractures. ?You should make sure that you get enough calcium and vitamin D while you are taking this medicine. Discuss the foods you eat and the vitamins you take with your healthcare professional. ?Wear a medical ID bracelet or chain. Carry a card that describes your disease and details of your medicine and dosage times. ?What side effects may I notice from receiving this medication? ?Side effects that you should report to your doctor or health care professional as soon as possible: ?allergic reactions like skin rash, itching or hives, swelling of the face, lips, or tongue ?bone pain ?fever, chills ?nausea, vomiting ?signs and symptoms of bleeding such as bloody or black, tarry stools; red or dark-brown urine; spitting up blood or brown material that looks like coffee grounds; red spots on the skin; unusual bruising or bleeding from the eye, gums, or nose ?signs and symptoms of a blood clot such as chest pain; shortness of breath; pain, swelling, or warmth in the leg ?signs and symptoms of a stroke such as changes in vision; confusion; trouble speaking or understanding; severe headaches; sudden numbness or weakness of the face, arm or leg; trouble walking; dizziness; loss of coordination ?Side effects that usually do not require medical attention (report to your doctor or health care professional if they continue or are bothersome): ?hair loss ?pain, redness, or irritation at site where injected ?This list may not describe all  possible side effects. Call your doctor for medical advice about side effects. You may report side effects to FDA at 1-800-FDA-1088. ?Where should I keep my medication? ?Keep out of the reach of children. ?Store unopened vials at room temperature between 15 and 30 degrees C (59 and 86 degrees F). Do not freeze. Do not use if solution is discolored or particulate matter is present. Throw away any unused medicine after the expiration date. ?NOTE: This sheet is a summary. It may not cover all possible information. If you have questions about this medicine, talk to your doctor, pharmacist, or health care provider. ?? 2023 Elsevier/Gold Standard (2020-12-05 00:00:00) ? ?

## 2022-06-01 ENCOUNTER — Other Ambulatory Visit: Payer: Self-pay

## 2022-06-08 ENCOUNTER — Ambulatory Visit (INDEPENDENT_AMBULATORY_CARE_PROVIDER_SITE_OTHER): Payer: No Typology Code available for payment source | Admitting: Gastroenterology

## 2022-06-08 ENCOUNTER — Encounter: Payer: Self-pay | Admitting: Gastroenterology

## 2022-06-08 VITALS — BP 148/84 | HR 79 | Ht 70.0 in | Wt 194.5 lb

## 2022-06-08 DIAGNOSIS — C187 Malignant neoplasm of sigmoid colon: Secondary | ICD-10-CM

## 2022-06-08 DIAGNOSIS — R194 Change in bowel habit: Secondary | ICD-10-CM | POA: Diagnosis not present

## 2022-06-08 NOTE — Patient Instructions (Addendum)
If you are age 73 or older, your body mass index should be between 23-30. Your Body mass index is 27.91 kg/m. If this is out of the aforementioned range listed, please consider follow up with your Primary Care Provider.  If you are age 36 or younger, your body mass index should be between 19-25. Your Body mass index is 27.91 kg/m. If this is out of the aformentioned range listed, please consider follow up with your Primary Care Provider.   __________________________________________________________  The [facility] providers would like to encourage you to use [facility] to communicate with providers for non-urgent requests or questions.  Due to long hold times on the telephone, sending your provider a message by [facility] may be a faster and more efficient way to get a response.  Please allow 48 business hours for a response.  Please remember that this is for non-urgent requests.    Due to recent changes in healthcare laws, you may see the results of your imaging and laboratory studies on MyChart before your provider has had a chance to review them.  We understand that in some cases there may be results that are confusing or concerning to you. Not all laboratory results come back in the same time frame and the provider may be waiting for multiple results in order to interpret others.  Please give [place] 48 hours in order for your provider to thoroughly review all the results before contacting the office for clarification of your results.   You have been scheduled for a colonoscopy. Please follow written instructions given to you at your visit today.  Please pick up your prep supplies at the pharmacy within the next 1-3 days. If you use inhalers (even only as needed), please bring them with you on the day of your procedure.   We have given you samples of the following medication to take: Clenpiq  Thank you for choosing me and [facility].  Vito Cirigliano, D.O.

## 2022-06-08 NOTE — Progress Notes (Signed)
Chief Complaint:    Colon cancer surveillance  GI History: 71 year old male with history of rectal cancer invading the sigmoid colon and invasion of seminal vesicles s/p chemotherapy, radiation, LAR with end colostomy in 02/2021, with course complicated by abdominal abscess requiring IR drainage, septic shock, respiratory distress, AKI, erosive esophagitis requiring transfusion.  -06/27/2020: CT: 7.5 x 6.1 x 5 cm rectal mass, increased stool throughout colon consistent with obstruction 2/2 mass - 07/09/2020: Initial appointment in GI clinic - 07/12/2020: MRI pelvis: tumor at 11.4 centimeters from the anal verge, 6.6 cm from the internal sphincter, tumor extends through the right lateral rectal wall and directly involves an adjacent loop of distal sigmoid colon with invasion of anterior peritoneal reflection and involvement of the right seminal vesicle, no lymphadenopathy.  T4N0 - 07/17/2020: Colonoscopy: mass at 12 cm from the anal verge, completely obstructing mass at 23 cm from the anal verge, large internal hemorrhoids, biopsy confirmed adenocarcinoma - 07/2020-10/2020: Completed 8 cycles FOLFOX - 11/2020-12/2020: Radiation/Xeloda - 03/05/2021: Low anterior resection with end colostomy.  Path: Moderately differentiated adenocarcinoma with tumor extending to sigmoid colon.  26 negative lymph nodes, negative resection margins.  MSI stable - 03/14/2021: Inpatient EGD: LA Grade D erosive esophagitis with bleeding and mucosal friability.  Retained food in stomach.  Treated with high-dose PPI and Carafate - 04/16/2022: CEA normal  HPI:     Patient is a 71 y.o. male presenting to the Gastroenterology Clinic for follow-up and to set up repeat colonoscopy for ongoing cancer surveillance.   Was last seen in the Oncology clinic by Dr. Benay Spice on 04/16/2022.  Currently in clinical remission and referred back to the GI clinic to set up surveillance colonoscopy via ostomy.  He does report high rectal output and  trying to schedule appointment with Dr. Leighton Ruff. Describes a mucus-like consistency. He felt it was like this immediately after surgery and has persisted since.   Otherwise, no upper GI symptoms.  Good p.o. intake.  Weight stable and otherwise feels in his usual state of health.  Maintains active lifestyle.   Review of systems:     No chest pain, no SOB, no fevers, no urinary sx   Past Medical History:  Diagnosis Date   Allergy    Arthritis    knee   Rectal cancer ([place]) 08/2020    Patient's surgical history, family medical history, social history, medications and allergies were all reviewed in Epic    Current Outpatient Medications  Medication Sig Dispense Refill   diclofenac (VOLTAREN) 75 MG EC tablet Take 75 mg by mouth daily as needed.     furosemide (LASIX) 20 MG tablet TAKE 1 TABLET(20 MG) BY MOUTH DAILY AS NEEDED FOR SWELLING 90 tablet 0   pravastatin (PRAVACHOL) 20 MG tablet Take 20 mg by mouth daily.     valsartan (DIOVAN) 80 MG tablet Take 1 tablet (80 mg total) by mouth daily. 90 tablet 1   No current facility-administered medications for this visit.    Physical Exam:     BP (!) 148/84   Pulse 79   Ht $R'5\' 10"'ZR$  (1.778 m)   Wt 194 lb 8 oz (88.2 kg)   BMI 27.91 kg/m   GENERAL:  Pleasant male in NAD PSYCH: : Cooperative, normal affect Musculoskeletal:  Normal muscle tone, normal strength NEURO: Alert and oriented x 3, no focal neurologic deficits   IMPRESSION and PLAN:    1) History of colon cancer - Colonoscopy via ostomy and of rectal stump for colon  cancer surveillance - Plan for full bowel prep along with tapwater enema the morning of procedure  2) Mucus-like stool - Continues to have mucus-like discharge from rectum several times a day.  Symptoms started essentially immediately after surgery.  Timetable does not seem consistent with diversion colitis.  Possibly a component of radiation proctitis?  Will confer with Dr. Marcello Moores from Colorectal  Surgery. - Evaluate rectal stump with biopsies as above - To follow-up with Dr. Marcello Moores as planned  The indications, risks, and benefits of colonoscopy via ostomy and rectal stump were explained to the patient in detail. Risks include but are not limited to bleeding, perforation, adverse reaction to medications, and cardiopulmonary compromise. Sequelae include but are not limited to the possibility of surgery, hospitalization, and mortality. The patient verbalized understanding and wished to proceed. All questions answered, referred to the scheduler and bowel prep ordered. Further recommendations pending results of the exam.        Shane [name] ,DO, FACG 06/08/2022, 9:05 AM

## 2022-06-09 ENCOUNTER — Other Ambulatory Visit: Payer: Self-pay

## 2022-06-16 ENCOUNTER — Encounter: Payer: No Typology Code available for payment source | Admitting: Gastroenterology

## 2022-06-17 ENCOUNTER — Ambulatory Visit (AMBULATORY_SURGERY_CENTER): Payer: No Typology Code available for payment source | Admitting: Gastroenterology

## 2022-06-17 ENCOUNTER — Encounter: Payer: Self-pay | Admitting: Gastroenterology

## 2022-06-17 VITALS — BP 103/49 | HR 64 | Temp 97.5°F | Resp 20 | Ht 70.0 in | Wt 194.0 lb

## 2022-06-17 DIAGNOSIS — D122 Benign neoplasm of ascending colon: Secondary | ICD-10-CM | POA: Diagnosis not present

## 2022-06-17 DIAGNOSIS — Z85048 Personal history of other malignant neoplasm of rectum, rectosigmoid junction, and anus: Secondary | ICD-10-CM | POA: Diagnosis not present

## 2022-06-17 DIAGNOSIS — Z85038 Personal history of other malignant neoplasm of large intestine: Secondary | ICD-10-CM | POA: Diagnosis not present

## 2022-06-17 DIAGNOSIS — C187 Malignant neoplasm of sigmoid colon: Secondary | ICD-10-CM

## 2022-06-17 DIAGNOSIS — K644 Residual hemorrhoidal skin tags: Secondary | ICD-10-CM

## 2022-06-17 DIAGNOSIS — K626 Ulcer of anus and rectum: Secondary | ICD-10-CM

## 2022-06-17 DIAGNOSIS — K635 Polyp of colon: Secondary | ICD-10-CM

## 2022-06-17 DIAGNOSIS — R195 Other fecal abnormalities: Secondary | ICD-10-CM

## 2022-06-17 MED ORDER — SODIUM CHLORIDE 0.9 % IV SOLN: 500.0000 mL | Freq: Once | INTRAVENOUS | Status: DC

## 2022-06-17 NOTE — Progress Notes (Signed)
GASTROENTEROLOGY PROCEDURE H&P NOTE   Primary Care Physician: Marrian Salvage, [place]    Reason for Procedure:   Colon cancer surveillance, change in bowel habits  Plan:    Pouchoscopy, colonoscopy via ostomy  Patient is appropriate for endoscopic procedure(s) in the ambulatory ([place]) setting.  The nature of the procedure, as well as the risks, benefits, and alternatives were carefully and thoroughly reviewed with the patient. Ample time for discussion and questions allowed. The patient understood, was satisfied, and agreed to proceed.     HPI: Shane [name]. is a 71 y.o. male who presents for pouchoscopy and colonoscopy via ostomy for evaluation of colon cancer surveullance and mucus-like stools .  Patient was most recently seen in the Gastroenterology Clinic on 06/08/2022 by me.  No interval change in medical history since that appointment. Please refer to that note for full details regarding GI history and clinical presentation.   Past Medical History:  Diagnosis Date   Allergy    Arthritis    knee   Rectal cancer ([place]) 08/2020    Past Surgical History:  Procedure Laterality Date   ESOPHAGOGASTRODUODENOSCOPY (EGD) WITH PROPOFOL N/A 03/14/2021   Procedure: ESOPHAGOGASTRODUODENOSCOPY (EGD) WITH PROPOFOL;  Surgeon: Lavena Bullion, DO;  Location: [facility];  Service: Gastroenterology;  Laterality: N/A;   IR CATHETER TUBE CHANGE  07/29/2021   IR IMAGING GUIDED PORT INSERTION  07/25/2020   IR RADIOLOGIST EVAL & MGMT  04/22/2021   IR RADIOLOGIST EVAL & MGMT  05/07/2021   IR RADIOLOGIST EVAL & MGMT  07/24/2021   IR RADIOLOGIST EVAL & MGMT  09/17/2021   IR SINUS/FIST TUBE CHK-NON GI  04/10/2021   IR SINUS/FIST TUBE CHK-NON GI  04/10/2021   IR US GUIDE BX ASP/DRAIN  03/11/2021   OSTOMY N/A 03/05/2021   Procedure: OSTOMY;  Surgeon: Leighton Ruff, MD;  Location: [facility];  Service: General;  Laterality: N/A;   WISDOM TOOTH EXTRACTION     XI ROBOTIC ASSISTED LOWER ANTERIOR  RESECTION N/A 03/05/2021   Procedure: XI ROBOTIC ASSISTED LOWER ANTERIOR RESECTION WITH MOBILIZATION OF THE SPLENIC FLEXURE OF THE COLON AND END COLOSTOMY;  Surgeon: Leighton Ruff, MD;  Location: [facility];  Service: General;  Laterality: N/A;  6 HOURS    Prior to Admission medications   Medication Sig Start Date End Date Taking? Authorizing Provider  diclofenac (VOLTAREN) 75 MG EC tablet Take 75 mg by mouth daily as needed. 05/18/22  Yes [provider]  furosemide (LASIX) 20 MG tablet TAKE 1 TABLET(20 MG) BY MOUTH DAILY AS NEEDED FOR SWELLING 01/08/22  Yes Marrian Salvage, FNP  pravastatin (PRAVACHOL) 20 MG tablet Take 20 mg by mouth daily.   Yes [provider]  valsartan (DIOVAN) 80 MG tablet Take 1 tablet (80 mg total) by mouth daily. 01/20/22  Yes Marrian Salvage, FNP    Current Outpatient Medications  Medication Sig Dispense Refill   diclofenac (VOLTAREN) 75 MG EC tablet Take 75 mg by mouth daily as needed.     furosemide (LASIX) 20 MG tablet TAKE 1 TABLET(20 MG) BY MOUTH DAILY AS NEEDED FOR SWELLING 90 tablet 0   pravastatin (PRAVACHOL) 20 MG tablet Take 20 mg by mouth daily.     valsartan (DIOVAN) 80 MG tablet Take 1 tablet (80 mg total) by mouth daily. 90 tablet 1   Current Facility-Administered Medications  Medication Dose Route Frequency Provider Last Rate Last Admin   0.9 %  sodium chloride infusion  500 mL Intravenous Once  Aasiya Creasey V, DO        Allergies as of 06/17/2022   (No Active Allergies)    Family History  Problem Relation Age of Onset   Healthy Mother    Hypertension Father    Stroke Father    Colon cancer Neg Hx    Esophageal cancer Neg Hx    Stomach cancer Neg Hx    Rectal cancer Neg Hx     Social History   Socioeconomic History   Marital status: Married    Spouse name: Not on file   Number of children: 2   Years of education: 14   Highest education level: Not on file  Occupational History   Occupation: Systems analyst    Comment: retired  Tobacco Use   Smoking status: Former    Packs/day: 1.00    Years: 10.00    Total pack years: 10.00    Types: Cigarettes    Quit date: 08/09/1978    Years since quitting: 43.8   Smokeless tobacco: Never  Vaping Use   Vaping Use: Never used  Substance and Sexual Activity   Alcohol use: No    Alcohol/week: 0.0 standard drinks of alcohol   Drug use: No   Sexual activity: Not Currently  Other Topics Concern   Not on file  Social History Narrative   Fun: [name] out with his grandkids, 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   Social Determinants of Radio broadcast assistant Strain: [place]  (02/23/2022)   Overall Financial Resource Strain (CARDIA)    Difficulty of Paying Living Expenses: Not hard at all  Food Insecurity: No Food Insecurity (02/23/2022)   Hunger Vital Sign    Worried About Running Out of Food in the Last Year: Never true    [place] in the Last Year: Never true  Transportation Needs: No Transportation Needs (02/23/2022)   PRAPARE - Hydrologist (Medical): No    Lack of Transportation (Non-Medical): No  Physical Activity: Sufficiently Active (02/23/2022)   Exercise Vital Sign    Days of Exercise per Week: 5 days    Minutes of Exercise per Session: 90 min  Stress: No Stress Concern Present (02/23/2022)   [place]    Feeling of Stress : Not at all  Social Connections: Not on file  Intimate Partner Violence: Not on file    Physical Exam: Vital signs in last 24 hours: '@BP'$  (!) 164/87   Pulse 89   Temp (!) 97.5 F (36.4 C)   Ht '5\' 10"'$  (1.778 m)   Wt 194 lb (88 kg)   SpO2 98%   BMI 27.84 kg/m  GEN: NAD EYE: Sclerae anicteric ENT: MMM CV: Non-tachycardic Pulm: CTA b/l GI: Soft, NT/ND NEURO:  Alert & Oriented x [place], DO [facility]   06/17/2022 3:23 PM

## 2022-06-17 NOTE — Progress Notes (Signed)
PT taken to PACU. Monitors in place. VSS. Report given to RN. 

## 2022-06-17 NOTE — Progress Notes (Signed)
Called to room to assist during endoscopic procedure.  Patient ID and intended procedure confirmed with present staff. Received instructions for my participation in the procedure from the performing physician.  

## 2022-06-17 NOTE — Op Note (Addendum)
[facility] Patient Name: Shane [name] Procedure Date: 06/17/2022 3:32 PM MRN: [id number] Endoscopist: Gerrit Heck , MD Age: 71 Referring MD:  Date of Birth: [birth year] Gender: Male Account #: [id number] Procedure:                Colonoscopy with polypectomy, pouchoscopy with                            biopsy Indications:              High risk colon cancer surveillance: Personal                            history of colon cancer                           Additionally, has been having mucus-like stools                            from the rectal pouch. Medicines:                Monitored Anesthesia Care Procedure:                Pre-Anesthesia Assessment:                           - Prior to the procedure, a History and Physical                            was performed, and patient medications and                            allergies were reviewed. The patient's tolerance of                            previous anesthesia was also reviewed. The risks                            and benefits of the procedure and the sedation                            options and risks were discussed with the patient.                            All questions were answered, and informed consent                            was obtained. Prior Anticoagulants: The patient has                            taken no previous anticoagulant or antiplatelet                            agents. ASA Grade Assessment: III - A patient with  severe systemic disease. After reviewing the risks                            and benefits, the patient was deemed in                            satisfactory condition to undergo the procedure.                           After obtaining informed consent, the colonoscope                            was passed under direct vision. Throughout the                            procedure, the patient's blood pressure, pulse, and                             oxygen saturations were monitored continuously. The                            Olympus PCF-H190DL ([id number]) Colonoscope was                            introduced through the anus and advanced to the                            rectum and base of the rectal pouch. After                            completion of this portion of the study, the                            patient was repositioned to supine and the                            colonoscope was introduced through the ostomy and                            advanced to the cecum, identified by appendiceal                            orifice and ileocecal valve. The colonoscopy and                            pouchoscopy was performed without difficulty. The                            patient tolerated the procedure well. The quality                            of the bowel preparation was good. The ileocecal  valve, appendiceal orifice, and rectum were                            photographed. Scope In: 3:39:23 PM Scope Out: 4:04:46 PM Scope Withdrawal Time: 0 hours 7 minutes 35 seconds  Total Procedure Duration: 0 hours 25 minutes 23 seconds  Findings:                 Skin tags were found on perianal exam.                           Severe inflammation characterized by congestion                            (edema), erythema, friability, contact oozing, and                            deep ulcerations was found in the rectum.                            Colonoscope advanced to 10 cm from the anal verge,                            which was the base of the pouch. Biopsies were                            taken with a cold forceps for histology. Estimated                            blood loss was minimal.                           A 3 mm polyp was found in the ascending colon. The                            polyp was sessile. The polyp was removed with a                            cold snare. Resection and retrieval  were complete.                            Estimated blood loss was minimal.                           Normal mucosa was found in the descending colon                            through the cecum. Complications:            No immediate complications. Estimated Blood Loss:     Estimated blood loss was minimal. Impression:               - Perianal skin tags found on perianal exam.                           -  Severe inflammation was found in the rectum.                            Biopsied.                           - One 3 mm polyp in the ascending colon, removed                            with a cold snare. Resected and retrieved.                           - Normal mucosa in the descending colon and in the                            cecum. Recommendation:           - Patient has a contact number available for                            emergencies. The signs and symptoms of potential                            delayed complications were discussed with the                            patient. Return to normal activities tomorrow.                            Written discharge instructions were provided to the                            patient.                           - Resume previous diet.                           - Continue present medications.                           - Await pathology results.                           - Repeat colonoscopy for surveillance based on                            pathology results.                           - Follow-up in the Colorectal Surgery Clinic. Gerrit Heck, MD 06/17/2022 4:18:23 PM

## 2022-06-17 NOTE — Progress Notes (Signed)
Pt's states no medical or surgical changes since previsit or office visit. 

## 2022-06-17 NOTE — Patient Instructions (Signed)
Handouts on polyps given to you today   YOU HAD AN ENDOSCOPIC PROCEDURE TODAY AT [facility]:   Refer to the procedure report that was given to you for any specific questions about what was found during the examination.  If the procedure report does not answer your questions, please call your gastroenterologist to clarify.  If you requested that your care partner not be given the details of your procedure findings, then the procedure report has been included in a sealed envelope for you to review at your convenience later.  YOU SHOULD EXPECT: Some feelings of bloating in the abdomen. Passage of more gas than usual.  Walking can help get rid of the air that was put into your GI tract during the procedure and reduce the bloating. If you had a lower endoscopy (such as a colonoscopy or flexible sigmoidoscopy) you may notice spotting of blood in your stool or on the toilet paper. If you underwent a bowel prep for your procedure, you may not have a normal bowel movement for a few days.  Please Note:  You might notice some irritation and congestion in your nose or some drainage.  This is from the oxygen used during your procedure.  There is no need for concern and it should clear up in a day or so.  SYMPTOMS TO REPORT IMMEDIATELY:  Following lower endoscopy (colonoscopy or flexible sigmoidoscopy):  Excessive amounts of blood in the stool  Significant tenderness or worsening of abdominal pains  Swelling of the abdomen that is new, acute  Fever of 100F or higher  For urgent or emergent issues, a gastroenterologist can be reached at any hour by calling [phone]. Do not use MyChart messaging for urgent concerns.    DIET:  We do recommend a small meal at first, but then you may proceed to your regular diet.  Drink plenty of fluids but you should avoid alcoholic beverages for 24 hours.  ACTIVITY:  You should plan to take it easy for the rest of today and you should NOT DRIVE or use  heavy machinery until tomorrow (because of the sedation medicines used during the test).    FOLLOW UP: Our staff will call the number listed on your records the next business day following your procedure.  We will call around 7:15- 8:00 am to check on you and address any questions or concerns that you may have regarding the information given to you following your procedure. If we do not reach you, we will leave a message.  If you develop any symptoms (ie: fever, flu-like symptoms, shortness of breath, cough etc.) before then, please call [phone].  If you test positive for Covid 19 in the 2 weeks post procedure, please call and report this information to [place].    If any biopsies were taken you will be contacted by phone or by letter within the next 1-3 weeks.  Please call us at [phone] if you have not heard about the biopsies in 3 weeks.    SIGNATURES/CONFIDENTIALITY: You and/or your care partner have signed paperwork which will be entered into your electronic medical record.  These signatures attest to the fact that that the information above on your After Visit Summary has been reviewed and is understood.  Full responsibility of the confidentiality of this discharge information lies with you and/or your care-partner.

## 2022-06-18 ENCOUNTER — Telehealth: Payer: Self-pay | Admitting: *Deleted

## 2022-06-18 NOTE — Telephone Encounter (Signed)
Attempted to call patient for their post-procedure follow-up call. No answer. Left voicemail.   

## 2022-06-22 DIAGNOSIS — K5289 Other specified noninfective gastroenteritis and colitis: Secondary | ICD-10-CM | POA: Diagnosis not present

## 2022-06-29 ENCOUNTER — Encounter: Payer: Self-pay | Admitting: Family

## 2022-06-29 DIAGNOSIS — R600 Localized edema: Secondary | ICD-10-CM

## 2022-06-29 MED ORDER — FUROSEMIDE 20 MG PO TABS: 20.0000 mg | ORAL_TABLET | Freq: Every day | ORAL | 0 refills | Status: DC | PRN

## 2022-06-29 MED ORDER — PRAVASTATIN SODIUM 20 MG PO TABS: 20.0000 mg | ORAL_TABLET | Freq: Every day | ORAL | 0 refills | Status: DC

## 2022-07-09 ENCOUNTER — Inpatient Hospital Stay: Payer: No Typology Code available for payment source | Attending: Physician Assistant

## 2022-07-09 VITALS — BP 150/65 | HR 69 | Temp 98.0°F | Resp 18 | Ht 70.0 in | Wt 191.2 lb

## 2022-07-09 DIAGNOSIS — C20 Malignant neoplasm of rectum: Secondary | ICD-10-CM | POA: Diagnosis not present

## 2022-07-09 DIAGNOSIS — Z452 Encounter for adjustment and management of vascular access device: Secondary | ICD-10-CM | POA: Diagnosis not present

## 2022-07-09 DIAGNOSIS — Z95828 Presence of other vascular implants and grafts: Secondary | ICD-10-CM

## 2022-07-09 MED ORDER — HEPARIN SOD (PORK) LOCK FLUSH 100 UNIT/ML IV SOLN
500.0000 [IU] | Freq: Once | INTRAVENOUS | Status: AC
  Administered 2022-07-09: 500 [IU]

## 2022-07-09 MED ORDER — SODIUM CHLORIDE 0.9% FLUSH
10.0000 mL | Freq: Once | INTRAVENOUS | Status: AC
  Administered 2022-07-09: 10 mL

## 2022-07-09 NOTE — Patient Instructions (Signed)

## 2022-07-14 ENCOUNTER — Telehealth: Payer: Self-pay | Admitting: Family

## 2022-07-17 ENCOUNTER — Telehealth: Payer: Self-pay | Admitting: Family

## 2022-07-17 ENCOUNTER — Other Ambulatory Visit: Payer: Self-pay | Admitting: Family

## 2022-07-17 NOTE — Telephone Encounter (Signed)
Can we see if he is taking Valsartan? His blood pressure has looked high when I review readings from other visits? We may need to increase the dosage.

## 2022-07-21 NOTE — Telephone Encounter (Signed)
I have called and spoke to the pt. He stated that he is taking the 80 mg of the Valsartan in the morning and has been checking his blood pressure but has not been writing it down. He is currently using the wrist BP cuff. I have spoken to the provider and she has recommended that the pt get a regular BP cuff that goes on the arm and check it for 7-10 days and give [place] the reading so we can gauge how his BP is doing on his current medication. He stated understanding and will give [place] a call back with the reading and to let us know what type of BP cuff he used.  FYI to provider as well.

## 2022-07-29 DIAGNOSIS — Z85048 Personal history of other malignant neoplasm of rectum, rectosigmoid junction, and anus: Secondary | ICD-10-CM | POA: Diagnosis not present

## 2022-07-29 DIAGNOSIS — Z008 Encounter for other general examination: Secondary | ICD-10-CM | POA: Diagnosis not present

## 2022-07-29 DIAGNOSIS — E663 Overweight: Secondary | ICD-10-CM | POA: Diagnosis not present

## 2022-07-29 DIAGNOSIS — Z6827 Body mass index (BMI) 27.0-27.9, adult: Secondary | ICD-10-CM | POA: Diagnosis not present

## 2022-07-29 DIAGNOSIS — E785 Hyperlipidemia, unspecified: Secondary | ICD-10-CM | POA: Diagnosis not present

## 2022-07-29 DIAGNOSIS — I10 Essential (primary) hypertension: Secondary | ICD-10-CM | POA: Diagnosis not present

## 2022-07-29 DIAGNOSIS — Z933 Colostomy status: Secondary | ICD-10-CM | POA: Diagnosis not present

## 2022-07-29 DIAGNOSIS — M1990 Unspecified osteoarthritis, unspecified site: Secondary | ICD-10-CM | POA: Diagnosis not present

## 2022-07-29 DIAGNOSIS — I700 Atherosclerosis of aorta: Secondary | ICD-10-CM | POA: Diagnosis not present

## 2022-07-29 DIAGNOSIS — R609 Edema, unspecified: Secondary | ICD-10-CM | POA: Diagnosis not present

## 2022-08-04 ENCOUNTER — Encounter: Payer: Self-pay | Admitting: Family

## 2022-08-04 DIAGNOSIS — K631 Perforation of intestine (nontraumatic): Secondary | ICD-10-CM | POA: Diagnosis not present

## 2022-08-04 DIAGNOSIS — Z933 Colostomy status: Secondary | ICD-10-CM | POA: Diagnosis not present

## 2022-08-20 ENCOUNTER — Encounter: Payer: Self-pay | Admitting: Oncology

## 2022-09-16 ENCOUNTER — Other Ambulatory Visit: Payer: Self-pay | Admitting: Family

## 2022-09-22 ENCOUNTER — Ambulatory Visit (HOSPITAL_BASED_OUTPATIENT_CLINIC_OR_DEPARTMENT_OTHER): Payer: No Typology Code available for payment source

## 2022-09-22 ENCOUNTER — Inpatient Hospital Stay: Payer: No Typology Code available for payment source

## 2022-09-22 DIAGNOSIS — Z0100 Encounter for examination of eyes and vision without abnormal findings: Secondary | ICD-10-CM | POA: Diagnosis not present

## 2022-09-24 ENCOUNTER — Inpatient Hospital Stay: Payer: No Typology Code available for payment source | Admitting: Oncology

## 2022-09-25 ENCOUNTER — Ambulatory Visit
Admission: RE | Admit: 2022-09-25 | Discharge: 2022-09-25 | Disposition: A | Discharge status: Home / Self Care | Payer: No Typology Code available for payment source | Source: Ambulatory Visit | Attending: Oncology | Admitting: Oncology

## 2022-09-25 DIAGNOSIS — R188 Other ascites: Secondary | ICD-10-CM | POA: Diagnosis not present

## 2022-09-25 DIAGNOSIS — Q396 Congenital diverticulum of esophagus: Secondary | ICD-10-CM | POA: Diagnosis not present

## 2022-09-25 DIAGNOSIS — N433 Hydrocele, unspecified: Secondary | ICD-10-CM | POA: Diagnosis not present

## 2022-09-25 DIAGNOSIS — C20 Malignant neoplasm of rectum: Secondary | ICD-10-CM

## 2022-09-25 DIAGNOSIS — J9811 Atelectasis: Secondary | ICD-10-CM | POA: Diagnosis not present

## 2022-09-25 DIAGNOSIS — C19 Malignant neoplasm of rectosigmoid junction: Secondary | ICD-10-CM | POA: Diagnosis not present

## 2022-09-25 MED ORDER — IOPAMIDOL (ISOVUE-300) INJECTION 61%
100.0000 mL | Freq: Once | INTRAVENOUS | Status: AC | PRN
  Administered 2022-09-25: 100 mL via INTRAVENOUS

## 2022-10-02 ENCOUNTER — Encounter (HOSPITAL_COMMUNITY): Payer: Self-pay

## 2022-10-02 ENCOUNTER — Encounter (HOSPITAL_BASED_OUTPATIENT_CLINIC_OR_DEPARTMENT_OTHER): Payer: Self-pay

## 2022-10-02 ENCOUNTER — Inpatient Hospital Stay (HOSPITAL_BASED_OUTPATIENT_CLINIC_OR_DEPARTMENT_OTHER)
Admission: EM | Admit: 2022-10-02 | Discharge: 2022-10-07 | DRG: 389 | Disposition: A | Discharge status: Home / Self Care | Payer: No Typology Code available for payment source | Attending: Internal Medicine | Admitting: Internal Medicine

## 2022-10-02 ENCOUNTER — Other Ambulatory Visit: Payer: Self-pay

## 2022-10-02 ENCOUNTER — Emergency Department (HOSPITAL_BASED_OUTPATIENT_CLINIC_OR_DEPARTMENT_OTHER): Payer: No Typology Code available for payment source

## 2022-10-02 DIAGNOSIS — Z23 Encounter for immunization: Secondary | ICD-10-CM

## 2022-10-02 DIAGNOSIS — K920 Hematemesis: Secondary | ICD-10-CM | POA: Diagnosis not present

## 2022-10-02 DIAGNOSIS — Z87891 Personal history of nicotine dependence: Secondary | ICD-10-CM | POA: Diagnosis not present

## 2022-10-02 DIAGNOSIS — Z1152 Encounter for screening for COVID-19: Secondary | ICD-10-CM | POA: Diagnosis not present

## 2022-10-02 DIAGNOSIS — Z923 Personal history of irradiation: Secondary | ICD-10-CM

## 2022-10-02 DIAGNOSIS — R9431 Abnormal electrocardiogram [ECG] [EKG]: Secondary | ICD-10-CM | POA: Diagnosis not present

## 2022-10-02 DIAGNOSIS — Z8719 Personal history of other diseases of the digestive system: Secondary | ICD-10-CM | POA: Diagnosis present

## 2022-10-02 DIAGNOSIS — K5669 Other partial intestinal obstruction: Secondary | ICD-10-CM | POA: Diagnosis not present

## 2022-10-02 DIAGNOSIS — Z823 Family history of stroke: Secondary | ICD-10-CM | POA: Diagnosis not present

## 2022-10-02 DIAGNOSIS — C20 Malignant neoplasm of rectum: Secondary | ICD-10-CM | POA: Diagnosis not present

## 2022-10-02 DIAGNOSIS — Z933 Colostomy status: Secondary | ICD-10-CM

## 2022-10-02 DIAGNOSIS — K565 Intestinal adhesions [bands], unspecified as to partial versus complete obstruction: Secondary | ICD-10-CM | POA: Diagnosis not present

## 2022-10-02 DIAGNOSIS — R1033 Periumbilical pain: Principal | ICD-10-CM

## 2022-10-02 DIAGNOSIS — Z79899 Other long term (current) drug therapy: Secondary | ICD-10-CM | POA: Diagnosis not present

## 2022-10-02 DIAGNOSIS — R188 Other ascites: Secondary | ICD-10-CM | POA: Diagnosis not present

## 2022-10-02 DIAGNOSIS — R1110 Vomiting, unspecified: Secondary | ICD-10-CM | POA: Diagnosis not present

## 2022-10-02 DIAGNOSIS — Z9221 Personal history of antineoplastic chemotherapy: Secondary | ICD-10-CM | POA: Diagnosis not present

## 2022-10-02 DIAGNOSIS — K56609 Unspecified intestinal obstruction, unspecified as to partial versus complete obstruction: Secondary | ICD-10-CM | POA: Diagnosis not present

## 2022-10-02 DIAGNOSIS — Z9049 Acquired absence of other specified parts of digestive tract: Secondary | ICD-10-CM | POA: Diagnosis not present

## 2022-10-02 DIAGNOSIS — Z8249 Family history of ischemic heart disease and other diseases of the circulatory system: Secondary | ICD-10-CM | POA: Diagnosis not present

## 2022-10-02 DIAGNOSIS — Z85048 Personal history of other malignant neoplasm of rectum, rectosigmoid junction, and anus: Secondary | ICD-10-CM | POA: Diagnosis present

## 2022-10-02 DIAGNOSIS — R109 Unspecified abdominal pain: Secondary | ICD-10-CM | POA: Diagnosis not present

## 2022-10-02 DIAGNOSIS — I700 Atherosclerosis of aorta: Secondary | ICD-10-CM | POA: Diagnosis not present

## 2022-10-02 LAB — URINALYSIS, ROUTINE W REFLEX MICROSCOPIC
Bilirubin Urine: NEGATIVE
Glucose, UA: NEGATIVE mg/dL
Leukocytes,Ua: NEGATIVE
Nitrite: NEGATIVE
Specific Gravity, Urine: >1.046 — ABNORMAL HIGH (ref 1.005–1.030)
pH: 8 (ref 5.0–8.0)

## 2022-10-02 LAB — COMPREHENSIVE METABOLIC PANEL
ALT: 18 U/L (ref 0–44)
AST: 18 U/L (ref 15–41)
Albumin: 5 g/dL (ref 3.5–5.0)
Alkaline Phosphatase: 109 U/L (ref 38–126)
Anion gap: 14 (ref 5–15)
BUN: 23 mg/dL (ref 8–23)
CO2: 27 mmol/L (ref 22–32)
Calcium: 10.8 mg/dL — ABNORMAL HIGH (ref 8.9–10.3)
Chloride: 98 mmol/L (ref 98–111)
Creatinine, Ser: 1.12 mg/dL (ref 0.61–1.24)
GFR, Estimated: >60 mL/min (ref >60)
Glucose, Bld: 141 mg/dL — ABNORMAL HIGH (ref 70–99)
Potassium: 3.9 mmol/L (ref 3.5–5.1)
Sodium: 139 mmol/L (ref 135–145)
Total Bilirubin: 1 mg/dL (ref 0.3–1.2)
Total Protein: 8.5 g/dL — ABNORMAL HIGH (ref 6.5–8.1)

## 2022-10-02 LAB — CBC WITH DIFFERENTIAL/PLATELET
Abs Immature Granulocytes: 0.03 10*3/uL (ref 0.00–0.07)
Basophils Absolute: 0.1 10*3/uL (ref 0.0–0.1)
Basophils Relative: 1 %
Eosinophils Absolute: 0 10*3/uL (ref 0.0–0.5)
Eosinophils Relative: 0 %
HCT: 42 % (ref 39.0–52.0)
Hemoglobin: 14.8 g/dL (ref 13.0–17.0)
Immature Granulocytes: 0 %
Lymphocytes Relative: 4 %
Lymphs Abs: 0.4 10*3/uL — ABNORMAL LOW (ref 0.7–4.0)
MCH: 30.7 pg (ref 26.0–34.0)
MCHC: 35.2 g/dL (ref 30.0–36.0)
MCV: 87.1 fL (ref 80.0–100.0)
Monocytes Absolute: 0.4 10*3/uL (ref 0.1–1.0)
Monocytes Relative: 4 %
Neutro Abs: 8.5 10*3/uL — ABNORMAL HIGH (ref 1.7–7.7)
Neutrophils Relative %: 91 %
Platelets: 272 10*3/uL (ref 150–400)
RBC: 4.82 MIL/uL (ref 4.22–5.81)
RDW: 12.3 % (ref 11.5–15.5)
WBC: 9.4 10*3/uL (ref 4.0–10.5)
nRBC: 0 % (ref 0.0–0.2)

## 2022-10-02 LAB — SARS CORONAVIRUS 2 BY RT PCR: SARS Coronavirus 2 by RT PCR: NEGATIVE

## 2022-10-02 LAB — LACTIC ACID, PLASMA
Lactic Acid, Venous: 2.6 mmol/L (ref 0.5–1.9)
Lactic Acid, Venous: 1.7 mmol/L (ref 0.5–1.9)

## 2022-10-02 LAB — LIPASE, BLOOD: Lipase: 17 U/L (ref 11–51)

## 2022-10-02 MED ORDER — LIDOCAINE-PRILOCAINE 2.5-2.5 % EX CREA
TOPICAL_CREAM | Freq: Once | CUTANEOUS | Status: AC
  Filled 2022-10-02: qty 5

## 2022-10-02 MED ORDER — MORPHINE SULFATE (PF) 2 MG/ML IV SOLN
1.0000 mg | INTRAVENOUS | Status: DC | PRN
  Administered 2022-10-02 – 2022-10-04 (×8): 1 mg via INTRAVENOUS
  Filled 2022-10-02 (×8): qty 1

## 2022-10-02 MED ORDER — PIPERACILLIN-TAZOBACTAM 3.375 G IVPB 30 MIN
3.3750 g | Freq: Four times a day (QID) | INTRAVENOUS | Status: DC
  Administered 2022-10-02 – 2022-10-04 (×7): 3.375 g via INTRAVENOUS
  Filled 2022-10-02 (×14): qty 50

## 2022-10-02 MED ORDER — IOHEXOL 300 MG/ML  SOLN
100.0000 mL | Freq: Once | INTRAMUSCULAR | Status: AC | PRN
  Administered 2022-10-02: 100 mL via INTRAVENOUS

## 2022-10-02 MED ORDER — SODIUM CHLORIDE 0.9 % IV BOLUS
500.0000 mL | Freq: Once | INTRAVENOUS | Status: AC
  Administered 2022-10-02: 500 mL via INTRAVENOUS

## 2022-10-02 MED ORDER — MORPHINE SULFATE (PF) 4 MG/ML IV SOLN
4.0000 mg | Freq: Once | INTRAVENOUS | Status: AC
  Administered 2022-10-02: 4 mg via INTRAVENOUS
  Filled 2022-10-02: qty 1

## 2022-10-02 MED ORDER — ONDANSETRON HCL 4 MG/2ML IJ SOLN
4.0000 mg | Freq: Four times a day (QID) | INTRAMUSCULAR | Status: DC | PRN
  Administered 2022-10-02 – 2022-10-05 (×9): 4 mg via INTRAVENOUS
  Filled 2022-10-02 (×10): qty 2

## 2022-10-02 MED ORDER — ENOXAPARIN SODIUM 40 MG/0.4ML IJ SOSY
40.0000 mg | PREFILLED_SYRINGE | INTRAMUSCULAR | Status: DC
  Administered 2022-10-02 – 2022-10-06 (×5): 40 mg via SUBCUTANEOUS
  Filled 2022-10-02 (×5): qty 0.4

## 2022-10-02 MED ORDER — METOCLOPRAMIDE HCL 5 MG/ML IJ SOLN
10.0000 mg | Freq: Once | INTRAMUSCULAR | Status: AC
  Administered 2022-10-02: 10 mg via INTRAVENOUS
  Filled 2022-10-02: qty 2

## 2022-10-02 MED ORDER — ONDANSETRON HCL 4 MG/2ML IJ SOLN
4.0000 mg | Freq: Once | INTRAMUSCULAR | Status: AC
  Administered 2022-10-02: 4 mg via INTRAVENOUS
  Filled 2022-10-02: qty 2

## 2022-10-02 MED ORDER — INFLUENZA VAC A&B SA ADJ QUAD 0.5 ML IM PRSY
0.5000 mL | PREFILLED_SYRINGE | INTRAMUSCULAR | Status: AC
  Administered 2022-10-03: 0.5 mL via INTRAMUSCULAR
  Filled 2022-10-02: qty 0.5

## 2022-10-02 MED ORDER — LACTATED RINGERS IV SOLN: INTRAVENOUS | Status: AC

## 2022-10-02 MED ORDER — PIPERACILLIN-TAZOBACTAM 3.375 G IVPB 30 MIN
3.3750 g | Freq: Once | INTRAVENOUS | Status: AC
  Administered 2022-10-02: 3.375 g via INTRAVENOUS
  Filled 2022-10-02: qty 50

## 2022-10-02 NOTE — Assessment & Plan Note (Signed)
Has known chronic presacral fluid collection.  CT this visit concerning for possible abscess within the area.  He is afebrile and without leukocytosis. -Continue empiric IV Zosyn for now

## 2022-10-02 NOTE — ED Triage Notes (Signed)
Pt states vomiting x6 last night, constant pain across stomach. Denies fever and chills. Vomiting black emesis.

## 2022-10-02 NOTE — ED Notes (Signed)
Attempted NG tube x2. Both attempts filed; pt wants to hold off at this time. Pt has had NG tubes in past and states it was difficult those times as well. Pt was sleeping when he had a successful one placed.

## 2022-10-02 NOTE — ED Notes (Signed)
Pt unable to urinate at this time; pt has urinal at the bedside.

## 2022-10-02 NOTE — H&P (Signed)
History and Physical    Shane [name]. BZJ:[id number] DOB: 09-28-1951 DOA: 10/02/2022  PCP: Marrian Salvage, [place]  Patient coming from: Home  I have personally briefly reviewed patient's old medical records in [place]  Chief Complaint: Abdominal pain, nausea, vomiting  HPI: Shane [name]. is a 71 y.o. male with medical history significant for rectal cancer s/p LAR and end colostomy who presented to the ED for evaluation of nausea, vomiting, abdominal pain.  Patient states that after eating Thanksgiving dinner yesterday (11/23) he developed pain across the abdomen followed by frequent episodes of nausea and vomiting.  He had been throwing up throughout the night and abdominal pain was becoming severe therefore he presented to the ED for further evaluation.  He has not had any output in his colostomy since onset of symptoms.  He denies any subjective fevers, chills, diaphoresis, chest pain, dyspnea.  Pain and nausea improving at time of arrival after receiving analgesics and antiemetics.  [facility] ED Course  Labs/Imaging on admission: I have personally reviewed following labs and imaging studies.  Initial vitals showed BP 164/78, pulse 87, RR 13, temp 97.9 F, SpO2 98% on room air.  Labs show WBC 9.4, hemoglobin 14.8, platelets 272,000, sodium 139, potassium 3.9, bicarb 27, BUN 23, creatinine 1.12, serum glucose 141, LFTs within normal limits, lactic acid 2.6 > 1.7, lipase 17.  SARS-CoV-2 PCR negative.  CT abdomen/pelvis with contrast shows changes concerning for small bowel obstruction secondary to adhesion or scarring.  Continued presence of large soft tissue abnormality in the presacral region measuring 7.0 x 5.9 cm seen, uncertain if postoperative scarring, inflammation or possible neoplasm.  Air and fluid collection noted within this area concerning for possible abscess.  Patient was given IV morphine, 1 L normal saline, Zosyn.  EDP  discussed with general surgery who recommended medical admission and NG tube placement.  NG tube placement attempt failed in the ED.  Hospitalist service was consulted to admit for further evaluation and management.  Review of Systems: All systems reviewed and are negative except as documented in history of present illness above.   Past Medical History:  Diagnosis Date   Allergy    Arthritis    knee   Rectal cancer ([place]) 08/2020    Past Surgical History:  Procedure Laterality Date   ESOPHAGOGASTRODUODENOSCOPY (EGD) WITH PROPOFOL N/A 03/14/2021   Procedure: ESOPHAGOGASTRODUODENOSCOPY (EGD) WITH PROPOFOL;  Surgeon: Lavena Bullion, DO;  Location: [facility];  Service: Gastroenterology;  Laterality: N/A;   IR CATHETER TUBE CHANGE  07/29/2021   IR IMAGING GUIDED PORT INSERTION  07/25/2020   IR RADIOLOGIST EVAL & MGMT  04/22/2021   IR RADIOLOGIST EVAL & MGMT  05/07/2021   IR RADIOLOGIST EVAL & MGMT  07/24/2021   IR RADIOLOGIST EVAL & MGMT  09/17/2021   IR SINUS/FIST TUBE CHK-NON GI  04/10/2021   IR SINUS/FIST TUBE CHK-NON GI  04/10/2021   IR US GUIDE BX ASP/DRAIN  03/11/2021   OSTOMY N/A 03/05/2021   Procedure: OSTOMY;  Surgeon: Leighton Ruff, MD;  Location: [facility]S;  Service: General;  Laterality: N/A;   WISDOM TOOTH EXTRACTION     XI ROBOTIC ASSISTED LOWER ANTERIOR RESECTION N/A 03/05/2021   Procedure: XI ROBOTIC ASSISTED LOWER ANTERIOR RESECTION WITH MOBILIZATION OF THE SPLENIC FLEXURE OF THE COLON AND END COLOSTOMY;  Surgeon: Leighton Ruff, MD;  Location: [facility];  Service: General;  Laterality: N/A;  6 HOURS    Social History:  reports that he quit  smoking about 44 years ago. His smoking use included cigarettes. He has a 10.00 pack-year smoking history. He has never used smokeless tobacco. He reports that he does not drink alcohol and does not use drugs.  No Known Allergies  Family History  Problem Relation Age of Onset   Healthy Mother    Hypertension Father    Stroke Father    Colon  cancer Neg Hx    Esophageal cancer Neg Hx    Stomach cancer Neg Hx    Rectal cancer Neg Hx      Prior to Admission medications   Medication Sig Start Date End Date Taking? Authorizing Provider  diclofenac (VOLTAREN) 75 MG EC tablet Take 75 mg by mouth 3 times/day as needed-between meals & bedtime (pain). 05/18/22  Yes [provider]  furosemide (LASIX) 20 MG tablet Take 1 tablet (20 mg total) by mouth daily as needed for fluid. Patient taking differently: Take 20 mg by mouth in the morning. 06/29/22  Yes Marrian Salvage, FNP  naproxen sodium (ALEVE) 220 MG tablet Take 440 mg by mouth daily as needed (pain).   Yes [provider]  pravastatin (PRAVACHOL) 20 MG tablet TAKE 1 TABLET BY MOUTH EVERY DAY Patient taking differently: Take 20 mg by mouth in the morning. 09/16/22  Yes Marrian Salvage, FNP  valsartan (DIOVAN) 80 MG tablet Take 1 tablet (80 mg total) by mouth daily. Patient taking differently: Take 80 mg by mouth in the morning. 07/17/22  Yes Marrian Salvage, FNP    Physical Exam: Vitals:   10/02/22 1230 10/02/22 1331 10/02/22 1600 10/02/22 1800  BP: (!) 155/76  (!) 141/62   Pulse: 79  71   Resp: 14  16   Temp:  98.6 F (37 C)  98.1 F (36.7 C)  TempSrc:  Oral  Oral  SpO2: 99%  99%   Weight:      Height:       Constitutional: Resting in bed, NAD, calm, comfortable Eyes: EOMI, lids and conjunctivae normal ENMT: Mucous membranes are moist. Posterior pharynx clear of any exudate or lesions.Normal dentition.  Neck: normal, supple, no masses. Respiratory: clear to auscultation bilaterally, no wheezing, no crackles. Normal respiratory effort. No accessory muscle use.  Cardiovascular: Regular rate and rhythm, no murmurs / rubs / gallops. No extremity edema. 2+ pedal pulses. Abdomen: Colostomy in place, scant brown stool present.  No tenderness. Musculoskeletal: no clubbing / cyanosis. No joint deformity upper and lower extremities. Good ROM, no  contractures. Normal muscle tone.  Skin: no rashes, lesions, ulcers. No induration Neurologic: Sensation intact. Strength 5/5 in all 4.  Psychiatric: Normal judgment and insight. Alert and oriented x 3. Normal mood.   EKG: Personally reviewed. Sinus rhythm, rate 87, T wave inversion in inferior and lateral leads.  T wave changes new when compared to previous from April 2022.  Assessment/Plan Principal Problem:   SBO (small bowel obstruction) (HCC) Active Problems:   Intra-abdominal fluid collection   Rectal carcinoma (HCC)   Shane [name]. is a 71 y.o. male with medical history significant for rectal cancer s/p LAR and end colostomy who is admitted with small bowel obstruction.  Assessment and Plan: * SBO (small bowel obstruction) (HCC) CT concerning for small bowel obstruction secondary to adhesion or scarring with transition zone seen in the pelvis.  NG tube placement failed in the ED, will hold off on placing at this time as his nausea and vomiting are fairly well-controlled now. -General surgery to  see -Keep n.p.o. -IV analgesics and antiemetics as needed -Continue IV fluid hydration overnight  Intra-abdominal fluid collection Has known chronic presacral fluid collection.  CT this visit concerning for possible abscess within the area.  He is afebrile and without leukocytosis. -Continue empiric IV Zosyn for now  Rectal carcinoma ([place]) S/p chemotherapy, radiation, LAR with end colostomy in 02/2021.  Follows with GI Dr. Bryan Lemma, general surgery Dr. Marcello Moores, and oncology Dr. Benay Spice.  DVT prophylaxis: enoxaparin (LOVENOX) injection 40 mg Start: 10/02/22 2015 Code Status: Full code, confirmed with patient on admission Family Communication: Discussed with patient, he has discussed with family Disposition Plan: From home and likely discharge to home pending clinical progress Consults called: General surgery Severity of Illness: The appropriate patient status for this  patient is INPATIENT. Inpatient status is judged to be reasonable and necessary in order to provide the required intensity of service to ensure the patient's safety. The patient's presenting symptoms, physical exam findings, and initial radiographic and laboratory data in the context of their chronic comorbidities is felt to place them at high risk for further clinical deterioration. Furthermore, it is not anticipated that the patient will be medically stable for discharge from the hospital within 2 midnights of admission.   * I certify that at the point of admission it is my clinical judgment that the patient will require inpatient hospital care spanning beyond 2 midnights from the point of admission due to high intensity of service, high risk for further deterioration and high frequency of surveillance required.Zada Finders MD [facility]  If 7PM-7AM, please contact night-coverage [email or web address]  10/02/2022, 7:29 PM

## 2022-10-02 NOTE — Progress Notes (Signed)
Plan of Care Note for accepted transfer   Patient: Shane [name]. MRN: [id number]   DOA: 10/02/2022  Facility requesting transfer: [facility] Requesting Provider: Dr. Johnney [name] Reason for transfer: SBO Facility course: 71 yo M w/ PMHx of rectal carcinoma, HTN, HLD. Presenting w/ N/V and abdominal pain. W/u revealed SBO and colonic abscess. EDPA spoke with general surgery (Shane [name]). Recommended started abx, placing NGT, and sending to [facility].   Plan of care: The patient is accepted for admission to Telemetry unit, at [facility]. While holding at [facility], medical decision making responsibilities remain with the Shane [name]. Upon arrival to [facility], Shane [name] will assume care. Thank you.   Author: Jonnie Finner, DO 10/02/2022  Check [email or web address] for on-call coverage.  Nursing staff, Please call [place] number on Amion as soon as patient's arrival, so appropriate admitting provider can evaluate the pt.

## 2022-10-02 NOTE — ED Provider Notes (Addendum)
[facility] EMERGENCY DEPT Provider Note   CSN: [id number] Arrival date & time: 10/02/22  6546     History  Chief Complaint  Patient presents with   Abdominal Pain    Shane [name]. is a 71 y.o. male.  71 y.o male with a PMH of rectal cancer with colostomy presents to the ED with a chief complaint of abdominal pain x yesterday along with vomiting. Patient reports a cramping sensation to the mid periumbilical area of his abdomen where his colostomy bag is obtained.  He does notice his abdomen to be more distended.  He is hearing some gurgling however he has had minimal output to his ostomy bag in the last 3 days which is out of his normal.  He also reports some nausea and 6 episodes of vomiting last night which noted to have some mild hematemesis.  Currently on no blood thinners.  Does have a prior history of rectal cancer last year but is currently in remission.  Has not taken any medication for improvement in his symptoms.  There is no alleviating or exacerbating factors.  No surgical history of his abdomen bleeding placement of a colostomy.  No fever,no shortness of breath, or other complaints.   The history is provided by the patient.  Abdominal Pain Pain location:  Periumbilical Pain quality: cramping   Pain severity:  Moderate Onset quality:  Sudden Duration:  1 day Timing:  Constant Progression:  Worsening Chronicity:  New Context: eating   Context: not medication withdrawal, not previous surgeries and not recent illness   Relieved by:  Nothing Worsened by:  Belching Ineffective treatments:  None tried Associated symptoms: nausea and vomiting   Associated symptoms: no chest pain, no chills, no diarrhea, no fever, no shortness of breath and no sore throat   Risk factors: being elderly and multiple surgeries   Risk factors: no alcohol abuse        Home Medications Prior to Admission medications   Medication Sig Start Date End Date Taking?  Authorizing Provider  diclofenac (VOLTAREN) 75 MG EC tablet Take 75 mg by mouth daily as needed. 05/18/22   [provider]  furosemide (LASIX) 20 MG tablet Take 1 tablet (20 mg total) by mouth daily as needed for fluid. 06/29/22   Marrian Salvage, FNP  pravastatin (PRAVACHOL) 20 MG tablet TAKE 1 TABLET BY MOUTH EVERY DAY 09/16/22   Marrian Salvage, FNP  valsartan (DIOVAN) 80 MG tablet Take 1 tablet (80 mg total) by mouth daily. 07/17/22   Marrian Salvage, [place]      Allergies    Patient has no active allergies.    Review of Systems   Review of Systems  Constitutional:  Negative for chills and fever.  HENT:  Negative for sore throat.   Respiratory:  Negative for shortness of breath.   Cardiovascular:  Negative for chest pain.  Gastrointestinal:  Positive for abdominal pain, nausea and vomiting. Negative for diarrhea.  Genitourinary:  Negative for flank pain.  Musculoskeletal:  Negative for back pain.  Skin:  Negative for pallor and wound.  Neurological:  Negative for light-headedness and headaches.  All other systems reviewed and are negative.   Physical Exam Updated Vital Signs BP (!) 155/76   Pulse 79   Temp 97.9 F (36.6 C) (Oral)   Resp 14   Ht '5\' 10"'$  (1.778 m)   Wt 86.7 kg   SpO2 99%   BMI 27.43 kg/m  Physical Exam Vitals and nursing  note reviewed.  Constitutional:      Appearance: He is well-developed.  HENT:     Head: Normocephalic and atraumatic.  Cardiovascular:     Rate and Rhythm: Tachycardia present.  Pulmonary:     Effort: Pulmonary effort is normal.     Breath sounds: No wheezing.  Abdominal:     General: Abdomen is flat. A surgical scar is present. Bowel sounds are absent. There is distension.     Palpations: Abdomen is soft.     Tenderness: There is abdominal tenderness. There is guarding. There is no right CVA tenderness or left CVA tenderness.     Comments: Ostomy bag in place with minimal output.   Skin:    General: Skin  is warm and dry.  Neurological:     Mental Status: He is alert and oriented to person, place, and time.     ED Results / Procedures / Treatments   Labs (all labs ordered are listed, but only abnormal results are displayed) Labs Reviewed  CBC WITH DIFFERENTIAL/PLATELET - Abnormal; Notable for the following components:      Result Value   Neutro Abs 8.5 (*)    Lymphs Abs 0.4 (*)    All other components within normal limits  COMPREHENSIVE METABOLIC PANEL - Abnormal; Notable for the following components:   Glucose, Bld 141 (*)    Calcium 10.8 (*)    Total Protein 8.5 (*)    All other components within normal limits  LACTIC ACID, PLASMA - Abnormal; Notable for the following components:   Lactic Acid, Venous 2.6 (*)    All other components within normal limits  SARS CORONAVIRUS 2 BY RT PCR  LIPASE, BLOOD  URINALYSIS, ROUTINE W REFLEX MICROSCOPIC  LACTIC ACID, PLASMA  ABO/RH    EKG EKG Interpretation  Date/Time:  Friday October 02 2022 09:30:09 EST Ventricular Rate:  87 PR Interval:  158 QRS Duration: 87 QT Interval:  332 QTC Calculation: 400 R Axis:   69 Text Interpretation: Sinus rhythm Abnormal R-wave progression, early transition Abnormal T, consider ischemia, diffuse leads agree. ischemic changes since previous tracing Confirmed by Charlesetta Shanks [phone]) on 10/02/2022 9:35:58 AM  Radiology CT ABDOMEN PELVIS W CONTRAST  Result Date: 10/02/2022 CLINICAL DATA:  Acute generalized abdominal pain, vomiting. EXAM: CT ABDOMEN AND PELVIS WITH CONTRAST TECHNIQUE: Multidetector CT imaging of the abdomen and pelvis was performed using the standard protocol following bolus administration of intravenous contrast. RADIATION DOSE REDUCTION: This exam was performed according to the departmental dose-optimization program which includes automated exposure control, adjustment of the mA and/or kV according to patient size and/or use of iterative reconstruction technique. CONTRAST:  125m  OMNIPAQUE IOHEXOL 300 MG/ML  SOLN COMPARISON:  September 25, 2022. FINDINGS: Lower chest: No acute abnormality. Hepatobiliary: No focal liver abnormality is seen. No gallstones, gallbladder wall thickening, or biliary dilatation. Pancreas: Unremarkable. No pancreatic ductal dilatation or surrounding inflammatory changes. Spleen: Normal in size without focal abnormality. Adrenals/Urinary Tract: Adrenal glands appear normal. Stable right renal cysts are noted for which no further follow-up is required. No hydronephrosis or renal obstruction is noted. Urinary bladder is unremarkable. Stomach/Bowel: The stomach is unremarkable. Colostomy is noted in left lower quadrant. There is interval development of significant small bowel dilatation, with transition zone seen in the pelvis best seen on image number 35 of series 5. This may be due to adhesion or scarring. Vascular/Lymphatic: Aortic atherosclerosis. No enlarged abdominal or pelvic lymph nodes. Reproductive: Prostate is unremarkable. Bilateral hydroceles are again noted. Other: Status  post surgical resection of rectum. There is again noted large soft tissue abnormality in the presacral region measuring 7.0 x 5.9 cm. There is continued presence air and fluid collection within this area measuring 3.6 x 3.2 cm. This is slightly increased compared to prior exam. Musculoskeletal: No acute or significant osseous findings. IMPRESSION: Status post rectal resection with colostomy placement and left lower quadrant. There is interval development of significant small bowel dilatation, with transition zone seen in the pelvis. This is highly concerning for obstruction secondary to adhesion or scarring. Continued presence of large soft tissue abnormality in the presacral region measuring 7.0 x 5.9 cm. It is uncertain if this represents postoperative scarring or inflammation or possibly neoplasm. 3.6 x 3.2 cm air and fluid collection is noted within this area which is increased  compared to prior exam slightly; this is concerning for possible abscess. Bilateral hydroceles are again noted. Aortic Atherosclerosis (ICD10-I70.0). Electronically Signed   By: Marijo Conception M.D.   On: 10/02/2022 11:41    Procedures Procedures    Medications Ordered in ED Medications  ondansetron (ZOFRAN) injection 4 mg (4 mg Intravenous Given 10/02/22 0948)  morphine (PF) 4 MG/ML injection 4 mg (4 mg Intravenous Given 10/02/22 0948)  sodium chloride 0.9 % bolus 500 mL (0 mLs Intravenous Stopped 10/02/22 1047)  piperacillin-tazobactam (ZOSYN) IVPB 3.375 g (0 g Intravenous Stopped 10/02/22 1224)  sodium chloride 0.9 % bolus 500 mL (500 mLs Intravenous New Bag/Given 10/02/22 1151)  iohexol (OMNIPAQUE) 300 MG/ML solution 100 mL (100 mLs Intravenous Contrast Given 10/02/22 1119)  morphine (PF) 4 MG/ML injection 4 mg (4 mg Intravenous Given 10/02/22 1231)  metoCLOPramide (REGLAN) injection 10 mg (10 mg Intravenous Given 10/02/22 1237)    ED Course/ Medical Decision Making/ A&P Clinical Course as of 10/02/22 1244  Fri Oct 02, 2022  1208 Lactic Acid, Venous(!!): 2.6 [JS]    Clinical Course User Index [JS] Janeece Fitting, PA-C                           Medical Decision Making Amount and/or Complexity of Data Reviewed Labs: ordered. Decision-making details documented in ED Course. Radiology: ordered.  Risk Prescription drug management.   This patient presents to the ED for concern of abdominal pain, this involves a number of treatment options, and is a complaint that carries with it a high risk of complications and morbidity.  The differential diagnosis includes ileus, obstruction, GI bleed versus perforation.   Co morbidities: Discussed in HPI   Brief History:  Patient with a prior history of rectal cancer here with sudden onset of periumbilical abdominal pain which began last night, reports vomiting throughout the night approximately 6 episodes of nonbilious vomiting, with some  specks of blood towards the end.  Does report his emesis were noted to be black at the end.  He did have Thanksgiving dinner yesterday.  He has noticed decrease output in his ostomy bag.  He did receive chemoradiation last year due to history of rectal cancer.  He is not having any fever or chills.  EMR reviewed including pt PMHx, past surgical history and past visits to ER.   See HPI for more details   Lab Tests:  I ordered and independently interpreted labs.  The pertinent results include:    I personally reviewed all laboratory work and imaging. Metabolic panel without any acute abnormality specifically kidney function within normal limits and no significant electrolyte abnormalities. CBC without leukocytosis or  significant anemia.  Lactic acid is 2.6, suspect for acute infection.   Imaging Studies:  Ct Abdomen and pelvis showed: Status post rectal resection with colostomy placement and left lower  quadrant. There is interval development of significant small bowel  dilatation, with transition zone seen in the pelvis. This is highly  concerning for obstruction secondary to adhesion or scarring.    Continued presence of large soft tissue abnormality in the presacral  region measuring 7.0 x 5.9 cm. It is uncertain if this represents  postoperative scarring or inflammation or possibly neoplasm. 3.6 x  3.2 cm air and fluid collection is noted within this area which is  increased compared to prior exam slightly; this is concerning for  possible abscess.    Cardiac Monitoring:  The patient was maintained on a cardiac monitor.  I personally viewed and interpreted the cardiac monitored which showed an underlying rhythm of: NSR 87 EKG non-ischemic  Medicines ordered:  I ordered medication including zofran, morphine  for symptomatic control Reevaluation of the patient after these medicines showed that the patient improved I have reviewed the patients home medicines and have made  adjustments as needed   Critical Interventions:  Due to elevated lactic at 2.6, patient was started prophylactically on intra-abdominal antibiotics such as Zosyn.   Consults:  I requested consultation with general surgery,  and discussed lab and imaging findings as well as pertinent plan - they recommend: NPO, NG tube, along with admission for further IV antibiotics to [facility].  Reevaluation:  After the interventions noted above I re-evaluated patient and found that they have :stayed the same  Social Determinants of Health:  The patient's social determinants of health were a factor in the care of this patient  Problem List / ED Course:  Patient here with sudden onset of periumbilical pain which began yesterday, also multiple episodes of vomiting until he began vomiting black emesis this morning.  Prior history of GI bleed.  Does have a history of rectal cancer and received chemo and radiation last year.  Does have a colostomy in place and reported some decrease in output over the last couple days which is not his normal.  His bowel sounds were diminished during auscultation.  Blood work was remarkable for a lactic of 2.6, concern for intra-abdominal pathology therefore started on Zosyn prophylactically.  CBC with no leukocytosis, CMP without any electrolyte derangement.  ET of the abdomen was ordered. Patient given morphine, Zofran, bolus to help with symptomatic treatment, CT does show some concern for abscess therefore general surgery was consulted at this time. Discussed CT findings with Dr. Dema Severin, I do feel that this is likely infectious versus inflammatory.  He did have radiation along with chemotherapy last year and his surgery was around April 2022, he was followed by Dr. Leighton Ruff however she now has joined the [employer] and is out of network for him, he reports he has not had much follow-up with them.  He did recommend continuing IV antibiotics and NG tube placement.   Patient is to be kept n.p.o. although there is no immediate surgical intervention at this time.  Patient dynamically stable for admission at this time.  Dispostion:  After consideration of the diagnostic results and the patients response to treatment, I feel that the patent would benefit from admission for further management of his abscess.    Portions of this note were generated with [employer]. Dictation errors may occur despite best attempts at proofreading.  Final Clinical  Impression(s) / ED Diagnoses Final diagnoses:  Periumbilical abdominal pain  Intraabdominal fluid collection    Rx / DC Orders ED Discharge Orders     None         Janeece Fitting, PA-C 10/02/22 1243    Janeece Fitting, PA-C 10/02/22 1244    Charlesetta Shanks, MD 10/03/22 [phone]

## 2022-10-02 NOTE — Consult Note (Signed)
CC/Reason for consult: SBO  Requesting physician: Dr. Zada Finders  HPI: Zohaib Heeney. is an 71 y.o. 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  He was found to have a loop of sigmoid that was adjacent to the rectal tumor.  No obvious metastatic disease.  He did receive total neoadjuvant treatment including chemo followed by chemoradiation.  Margins of resection were negative but final pathology demonstrated [id number], MSI stable.  He has been following with medical oncology, Dr. Benay Spice at his last visit in June, 2023, was noted to be in remission.  CT A/P 09/26/22 -no evidence of colorectal carcinoma recurrence or metastases in the abdomen or pelvis.  Left colostomy without complication.  Presacral well-circumscribed gas and fluid collection not changed compared to prior-09/17/2021.  Repeat CT A/P 10/02/22 -  Status post rectal resection with colostomy placement and left lower quadrant. There is interval development of significant small bowel dilatation, with transition zone seen in the pelvis. This is highly concerning for obstruction secondary to adhesion or scarring.   Continued presence of large soft tissue abnormality in the presacral region measuring 7.0 x 5.9 cm. It is uncertain if this represents postoperative scarring or inflammation or possibly neoplasm. 3.6 x 3.2 cm air and fluid collection is noted within this area which is increased compared to prior exam slightly; this is concerning for possible abscess.  Last colonoscopy - 06/2022 - Dr. Bryan Lemma -there is some inflammation in the proximal rectum but no evident mass.  Biopsies of this region demonstrated no evident cancer cells but inflammatory type changes.  For the last day or so n/v and abdominal cramps following larger than normal meal on Thanksgiving.  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[facility]) 08/2020    Past Surgical History:  Procedure Laterality Date   ESOPHAGOGASTRODUODENOSCOPY (EGD) WITH PROPOFOL N/A 03/14/2021   Procedure: ESOPHAGOGASTRODUODENOSCOPY (EGD) WITH PROPOFOL;  Surgeon: Lavena Bullion, DO;  Location: [facility];  Service: Gastroenterology;  Laterality: N/A;   IR CATHETER TUBE CHANGE  07/29/2021   IR IMAGING GUIDED PORT INSERTION  07/25/2020   IR RADIOLOGIST EVAL & MGMT  04/22/2021   IR RADIOLOGIST EVAL & MGMT  05/07/2021   IR RADIOLOGIST EVAL & MGMT  07/24/2021   IR RADIOLOGIST EVAL & MGMT  09/17/2021   IR SINUS/FIST TUBE CHK-NON GI  04/10/2021   IR SINUS/FIST TUBE CHK-NON GI  04/10/2021   IR US GUIDE BX ASP/DRAIN  03/11/2021   OSTOMY N/A 03/05/2021   Procedure: OSTOMY;  Surgeon: Leighton Ruff, MD;  Location: [facility];  Service: General;  Laterality: N/A;   WISDOM TOOTH EXTRACTION     XI ROBOTIC ASSISTED LOWER ANTERIOR RESECTION N/A 03/05/2021   Procedure: XI ROBOTIC ASSISTED LOWER ANTERIOR RESECTION WITH MOBILIZATION OF THE SPLENIC FLEXURE OF THE COLON AND END COLOSTOMY;  Surgeon: Leighton Ruff, MD;  Location: [facility];  Service: General;  Laterality: N/A;  6 HOURS    Family History  Problem Relation Age of Onset   Healthy Mother    Hypertension Father    Stroke Father    Colon cancer Neg Hx    Esophageal cancer Neg Hx    Stomach cancer Neg Hx    Rectal cancer Neg Hx     Social:  reports  that he quit smoking about 44 years ago. His smoking use included cigarettes. He has a 10.00 pack-year smoking history. He has never used smokeless tobacco. He reports that he does not drink alcohol and does not use drugs.  Allergies: No Known Allergies  Medications: I have reviewed the patient's current medications.  Results for orders placed or performed during the hospital encounter of 10/02/22 (from the past 48 hour(s))  CBC with Differential     Status: Abnormal    Collection Time: 10/02/22  9:43 AM  Result Value Ref Range   WBC 9.4 4.0 - 10.5 K/uL   RBC 4.82 4.22 - 5.81 MIL/uL   Hemoglobin 14.8 13.0 - 17.0 g/dL   HCT 42.0 39.0 - 52.0 %   MCV 87.1 80.0 - 100.0 fL   MCH 30.7 26.0 - 34.0 pg   MCHC 35.2 30.0 - 36.0 g/dL   RDW 12.3 11.5 - 15.5 %   Platelets 272 150 - 400 K/uL   nRBC 0.0 0.0 - 0.2 %   Neutrophils Relative % 91 %   Neutro Abs 8.5 (H) 1.7 - 7.7 K/uL   Lymphocytes Relative 4 %   Lymphs Abs 0.4 (L) 0.7 - 4.0 K/uL   Monocytes Relative 4 %   Monocytes Absolute 0.4 0.1 - 1.0 K/uL   Eosinophils Relative 0 %   Eosinophils Absolute 0.0 0.0 - 0.5 K/uL   Basophils Relative 1 %   Basophils Absolute 0.1 0.0 - 0.1 K/uL   Immature Granulocytes 0 %   Abs Immature Granulocytes 0.03 0.00 - 0.07 K/uL    Comment: Performed at [facility], [place], [place] [address]  Comprehensive metabolic panel     Status: Abnormal   Collection Time: 10/02/22  9:43 AM  Result Value Ref Range   Sodium 139 135 - 145 mmol/L   Potassium 3.9 3.5 - 5.1 mmol/L   Chloride 98 98 - 111 mmol/L   CO2 27 22 - 32 mmol/L   Glucose, Bld 141 (H) 70 - 99 mg/dL    Comment: Glucose reference range applies only to samples taken after fasting for at least 8 hours.   BUN 23 8 - 23 mg/dL   Creatinine, Ser 1.12 0.61 - 1.24 mg/dL   Calcium 10.8 (H) 8.9 - 10.3 mg/dL   Total Protein 8.5 (H) 6.5 - 8.1 g/dL   Albumin 5.0 3.5 - 5.0 g/dL   AST 18 15 - 41 U/L   ALT 18 0 - 44 U/L   Alkaline Phosphatase 109 38 - 126 U/L   Total Bilirubin 1.0 0.3 - 1.2 mg/dL   GFR, Estimated >60 >60 mL/min    Comment: (NOTE) Calculated using the CKD-EPI Creatinine Equation (2021)    Anion gap 14 5 - 15    Comment: Performed at [facility], [place], [place] [address]  Lipase, blood     Status: None   Collection Time: 10/02/22  9:43 AM  Result Value Ref Range   Lipase 17 11 - 51 U/L    Comment: Performed at [facility], [place], [place] [address]  Lactic acid, plasma     Status: Abnormal   Collection Time: 10/02/22  9:43 AM  Result Value Ref Range   Lactic Acid, Venous 2.6 (HH) 0.5 - 1.9 mmol/L    Comment: CRITICAL RESULT CALLED TO, READ BACK BY AND VERIFIED WITH: D PANTHER,RN 1049 10/02/2022 DBRADLEY Performed at [facility], [address]  [place], [place] [address]   Lactic acid, plasma     Status: None   Collection Time: 10/02/22 11:59 AM  Result Value Ref Range   Lactic Acid, Venous 1.7 0.5 - 1.9 mmol/L    Comment: Performed at [facility], [place], [place] [address]  SARS Coronavirus 2 by RT PCR (hospital order, performed in [facility] hospital lab) *cepheid single result test* Anterior Nasal Swab     Status: None   Collection Time: 10/02/22 12:32 PM   Specimen: Anterior Nasal Swab  Result Value Ref Range   SARS Coronavirus 2 by RT PCR NEGATIVE NEGATIVE    Comment: (NOTE) SARS-CoV-2 target nucleic acids are NOT DETECTED.  The SARS-CoV-2 RNA is generally detectable in upper and lower respiratory specimens during the acute phase of infection. The lowest concentration of SARS-CoV-2 viral copies this assay can detect is 250 copies / mL. A negative result does not preclude SARS-CoV-2 infection and should not be used as the sole basis for treatment or other patient management decisions.  A negative result may occur with improper specimen collection / handling, submission of specimen other than nasopharyngeal swab, presence of viral mutation(s) within the areas targeted by this assay, and inadequate number of viral copies (<250 copies / mL). A negative result must be combined with clinical observations, patient history, and epidemiological information.  Fact Sheet for Patients:   https://www.patel.info/  Fact Sheet for Healthcare  Providers: https://hall.com/  This test is not yet approved or  cleared by the [place] FDA and has been authorized for detection and/or diagnosis of SARS-CoV-2 by FDA under an Emergency Use Authorization (EUA).  This EUA will remain in effect (meaning this test can be used) for the duration of the COVID-19 declaration under Section 564(b)(1) of the Act, 21 U.S.C. section 360bbb-3(b)(1), unless the authorization is terminated or revoked sooner.  Performed at [facility], [address], [place], [place] [address]   Urinalysis, Routine w reflex microscopic Urine, Clean Catch     Status: Abnormal   Collection Time: 10/02/22  1:15 PM  Result Value Ref Range   Color, Urine YELLOW YELLOW   APPearance CLEAR CLEAR   Specific Gravity, Urine >1.046 (H) 1.005 - 1.030   pH 8.0 5.0 - 8.0   Glucose, UA NEGATIVE NEGATIVE mg/dL   Hgb urine dipstick MODERATE (A) NEGATIVE   Bilirubin Urine NEGATIVE NEGATIVE   Ketones, ur TRACE (A) NEGATIVE mg/dL   Protein, ur TRACE (A) NEGATIVE mg/dL   Nitrite NEGATIVE NEGATIVE   Leukocytes,Ua NEGATIVE NEGATIVE   RBC / HPF 11-20 0 - 5 RBC/hpf   WBC, UA 0-5 0 - 5 WBC/hpf   Squamous Epithelial / LPF 0-5 0 - 5   Mucus PRESENT     Comment: Performed at [facility], [address], [place], [place] [address]    CT ABDOMEN PELVIS W CONTRAST  Result Date: 10/02/2022 CLINICAL DATA:  Acute generalized abdominal pain, vomiting. EXAM: CT ABDOMEN AND PELVIS WITH CONTRAST TECHNIQUE: Multidetector CT imaging of the abdomen and pelvis was performed using the standard protocol following bolus administration of intravenous contrast. RADIATION DOSE REDUCTION: This exam was performed according to the departmental dose-optimization program which includes automated exposure control, adjustment of the mA and/or kV according to patient size and/or use of iterative reconstruction technique. CONTRAST:  146m  OMNIPAQUE IOHEXOL 300 MG/ML  SOLN COMPARISON:  September 25, 2022. FINDINGS: Lower chest: No acute abnormality. Hepatobiliary: No focal liver abnormality is seen. No gallstones, gallbladder  wall thickening, or biliary dilatation. Pancreas: Unremarkable. No pancreatic ductal dilatation or surrounding inflammatory changes. Spleen: Normal in size without focal abnormality. Adrenals/Urinary Tract: Adrenal glands appear normal. Stable right renal cysts are noted for which no further follow-up is required. No hydronephrosis or renal obstruction is noted. Urinary bladder is unremarkable. Stomach/Bowel: The stomach is unremarkable. Colostomy is noted in left lower quadrant. There is interval development of significant small bowel dilatation, with transition zone seen in the pelvis best seen on image number 35 of series 5. This may be due to adhesion or scarring. Vascular/Lymphatic: Aortic atherosclerosis. No enlarged abdominal or pelvic lymph nodes. Reproductive: Prostate is unremarkable. Bilateral hydroceles are again noted. Other: Status post surgical resection of rectum. There is again noted large soft tissue abnormality in the presacral region measuring 7.0 x 5.9 cm. There is continued presence air and fluid collection within this area measuring 3.6 x 3.2 cm. This is slightly increased compared to prior exam. Musculoskeletal: No acute or significant osseous findings. IMPRESSION: Status post rectal resection with colostomy placement and left lower quadrant. There is interval development of significant small bowel dilatation, with transition zone seen in the pelvis. This is highly concerning for obstruction secondary to adhesion or scarring. Continued presence of large soft tissue abnormality in the presacral region measuring 7.0 x 5.9 cm. It is uncertain if this represents postoperative scarring or inflammation or possibly neoplasm. 3.6 x 3.2 cm air and fluid collection is noted within this area which is increased  compared to prior exam slightly; this is concerning for possible abscess. Bilateral hydroceles are again noted. Aortic Atherosclerosis (ICD10-I70.0). Electronically Signed   By: Marijo Conception M.D.   On: 10/02/2022 11:41    ROS - all of the below systems have been reviewed with the patient and positives are indicated with bold text General: chills, fever or night sweats Eyes: blurry vision or double vision ENT: epistaxis or sore throat Allergy/Immunology: itchy/watery eyes or nasal congestion Hematologic/Lymphatic: bleeding problems, blood clots or swollen lymph nodes Endocrine: temperature intolerance or unexpected weight changes Breast: new or changing breast lumps or nipple discharge Resp: cough, shortness of breath, or wheezing CV: chest pain or dyspnea on exertion GI: as per HPI GU: dysuria, trouble voiding, or hematuria MSK: joint pain or joint stiffness Neuro: TIA or stroke symptoms Derm: pruritus and skin lesion changes Psych: anxiety and depression  PE Blood pressure (!) 141/62, pulse 72, temperature 98 F (36.7 C), temperature source Oral, resp. rate 16, height _0  (1.778 m), weight 86.7 kg, SpO2 100 %. Constitutional: NAD; conversant Eyes: Moist conjunctiva; no lid lag Lungs: Normal respiratory effort CV: RRR GI: Abd soft, NT/ND; colostomy productive of gas and stool; no palpable hepatosplenomegaly MSK: Normal range of motion of extremities Psychiatric: Appropriate affect; alert and oriented x3   Results for orders placed or performed during the hospital encounter of 10/02/22 (from the past 48 hour(s))  CBC with Differential     Status: Abnormal   Collection Time: 10/02/22  9:43 AM  Result Value Ref Range   WBC 9.4 4.0 - 10.5 K/uL   RBC 4.82 4.22 - 5.81 MIL/uL   Hemoglobin 14.8 13.0 - 17.0 g/dL   HCT 42.0 39.0 - 52.0 %   MCV 87.1 80.0 - 100.0 fL   MCH 30.7 26.0 - 34.0 pg   MCHC 35.2 30.0 - 36.0 g/dL   RDW 12.3 11.5 - 15.5 %   Platelets 272 150 - 400 K/uL    nRBC 0.0 0.0 - 0.2 %  Neutrophils Relative % 91 %   Neutro Abs 8.5 (H) 1.7 - 7.7 K/uL   Lymphocytes Relative 4 %   Lymphs Abs 0.4 (L) 0.7 - 4.0 K/uL   Monocytes Relative 4 %   Monocytes Absolute 0.4 0.1 - 1.0 K/uL   Eosinophils Relative 0 %   Eosinophils Absolute 0.0 0.0 - 0.5 K/uL   Basophils Relative 1 %   Basophils Absolute 0.1 0.0 - 0.1 K/uL   Immature Granulocytes 0 %   Abs Immature Granulocytes 0.03 0.00 - 0.07 K/uL    Comment: Performed at [facility], [address], [place], [place] [address]  Comprehensive metabolic panel     Status: Abnormal   Collection Time: 10/02/22  9:43 AM  Result Value Ref Range   Sodium 139 135 - 145 mmol/L   Potassium 3.9 3.5 - 5.1 mmol/L   Chloride 98 98 - 111 mmol/L   CO2 27 22 - 32 mmol/L   Glucose, Bld 141 (H) 70 - 99 mg/dL    Comment: Glucose reference range applies only to samples taken after fasting for at least 8 hours.   BUN 23 8 - 23 mg/dL   Creatinine, Ser 1.12 0.61 - 1.24 mg/dL   Calcium 10.8 (H) 8.9 - 10.3 mg/dL   Total Protein 8.5 (H) 6.5 - 8.1 g/dL   Albumin 5.0 3.5 - 5.0 g/dL   AST 18 15 - 41 U/L   ALT 18 0 - 44 U/L   Alkaline Phosphatase 109 38 - 126 U/L   Total Bilirubin 1.0 0.3 - 1.2 mg/dL   GFR, Estimated >60 >60 mL/min    Comment: (NOTE) Calculated using the CKD-EPI Creatinine Equation (2021)    Anion gap 14 5 - 15    Comment: Performed at [facility], [place], [place] [address]  Lipase, blood     Status: None   Collection Time: 10/02/22  9:43 AM  Result Value Ref Range   Lipase 17 11 - 51 U/L    Comment: Performed at [facility], [place], [place] [address]  Lactic acid, plasma     Status: Abnormal   Collection Time: 10/02/22  9:43 AM  Result Value Ref Range   Lactic Acid, Venous 2.6 (HH) 0.5 - 1.9 mmol/L    Comment: CRITICAL RESULT CALLED TO, READ BACK BY AND VERIFIED WITHMiquel Dunn 1049 10/02/2022  DBRADLEY Performed at [facility], [address] [place], [place] [address]   Lactic acid, plasma     Status: None   Collection Time: 10/02/22 11:59 AM  Result Value Ref Range   Lactic Acid, Venous 1.7 0.5 - 1.9 mmol/L    Comment: Performed at [facility], [address], [place], [place] [address]  SARS Coronavirus 2 by RT PCR (hospital order, performed in [facility] hospital lab) *cepheid single result test* Anterior Nasal Swab     Status: None   Collection Time: 10/02/22 12:32 PM   Specimen: Anterior Nasal Swab  Result Value Ref Range   SARS Coronavirus 2 by RT PCR NEGATIVE NEGATIVE    Comment: (NOTE) SARS-CoV-2 target nucleic acids are NOT DETECTED.  The SARS-CoV-2 RNA is generally detectable in upper and lower respiratory specimens during the acute phase of infection. The lowest concentration of SARS-CoV-2 viral copies this assay can detect is 250 copies / mL. A negative result does not preclude SARS-CoV-2 infection and should not be used as the sole basis for treatment or other patient  management decisions.  A negative result may occur with improper specimen collection / handling, submission of specimen other than nasopharyngeal swab, presence of viral mutation(s) within the areas targeted by this assay, and inadequate number of viral copies (<250 copies / mL). A negative result must be combined with clinical observations, patient history, and epidemiological information.  Fact Sheet for Patients:   https://www.patel.info/  Fact Sheet for Healthcare Providers: https://hall.com/  This test is not yet approved or  cleared by the [place] FDA and has been authorized for detection and/or diagnosis of SARS-CoV-2 by FDA under an Emergency Use Authorization (EUA).  This EUA will remain in effect (meaning this test can be used) for the duration of the COVID-19 declaration under Section  564(b)(1) of the Act, 21 U.S.C. section 360bbb-3(b)(1), unless the authorization is terminated or revoked sooner.  Performed at [facility], [address] [place], [place] [address]   Urinalysis, Routine w reflex microscopic Urine, Clean Catch     Status: Abnormal   Collection Time: 10/02/22  1:15 PM  Result Value Ref Range   Color, Urine YELLOW YELLOW   APPearance CLEAR CLEAR   Specific Gravity, Urine >1.046 (H) 1.005 - 1.030   pH 8.0 5.0 - 8.0   Glucose, UA NEGATIVE NEGATIVE mg/dL   Hgb urine dipstick MODERATE (A) NEGATIVE   Bilirubin Urine NEGATIVE NEGATIVE   Ketones, ur TRACE (A) NEGATIVE mg/dL   Protein, ur TRACE (A) NEGATIVE mg/dL   Nitrite NEGATIVE NEGATIVE   Leukocytes,Ua NEGATIVE NEGATIVE   RBC / HPF 11-20 0 - 5 RBC/hpf   WBC, UA 0-5 0 - 5 WBC/hpf   Squamous Epithelial / LPF 0-5 0 - 5   Mucus PRESENT     Comment: Performed at [facility], [address], [place], [place] [address]    CT ABDOMEN PELVIS W CONTRAST  Result Date: 10/02/2022 CLINICAL DATA:  Acute generalized abdominal pain, vomiting. EXAM: CT ABDOMEN AND PELVIS WITH CONTRAST TECHNIQUE: Multidetector CT imaging of the abdomen and pelvis was performed using the standard protocol following bolus administration of intravenous contrast. RADIATION DOSE REDUCTION: This exam was performed according to the departmental dose-optimization program which includes automated exposure control, adjustment of the mA and/or kV according to patient size and/or use of iterative reconstruction technique. CONTRAST:  121m OMNIPAQUE IOHEXOL 300 MG/ML  SOLN COMPARISON:  September 25, 2022. FINDINGS: Lower chest: No acute abnormality. Hepatobiliary: No focal liver abnormality is seen. No gallstones, gallbladder wall thickening, or biliary dilatation. Pancreas: Unremarkable. No pancreatic ductal dilatation or surrounding inflammatory changes. Spleen: Normal in size without focal abnormality.  Adrenals/Urinary Tract: Adrenal glands appear normal. Stable right renal cysts are noted for which no further follow-up is required. No hydronephrosis or renal obstruction is noted. Urinary bladder is unremarkable. Stomach/Bowel: The stomach is unremarkable. Colostomy is noted in left lower quadrant. There is interval development of significant small bowel dilatation, with transition zone seen in the pelvis best seen on image number 35 of series 5. This may be due to adhesion or scarring. Vascular/Lymphatic: Aortic atherosclerosis. No enlarged abdominal or pelvic lymph nodes. Reproductive: Prostate is unremarkable. Bilateral hydroceles are again noted. Other: Status post surgical resection of rectum. There is again noted large soft tissue abnormality in the presacral region measuring 7.0 x 5.9 cm. There is continued presence air and fluid collection within this area measuring 3.6 x 3.2 cm. This is slightly increased compared to prior exam. Musculoskeletal: No acute or significant osseous findings. IMPRESSION: Status post rectal resection with  colostomy placement and left lower quadrant. There is interval development of significant small bowel dilatation, with transition zone seen in the pelvis. This is highly concerning for obstruction secondary to adhesion or scarring. Continued presence of large soft tissue abnormality in the presacral region measuring 7.0 x 5.9 cm. It is uncertain if this represents postoperative scarring or inflammation or possibly neoplasm. 3.6 x 3.2 cm air and fluid collection is noted within this area which is increased compared to prior exam slightly; this is concerning for possible abscess. Bilateral hydroceles are again noted. Aortic Atherosclerosis (ICD10-I70.0). Electronically Signed   By: Marijo Conception M.D.   On: 10/02/2022 11:41     A/P: Yashua Bracco. is an 71 y.o. male with hx of distal sigmoid cancer, TNT with chemo + chemoXRT 2021-2022 followed by LAR/[facility] with  colostomy, now with suspected SBO transition in pelvis  -No need for NG at present - appears to be clinically resolving. -Clear liquids, advance as tolerated -We will follow with you  I spent a total of 60 minutes in both face-to-face and non-face-to-face activities, excluding procedures performed, for this visit on the date of this encounter.  Nadeen Landau, [facility], [place]

## 2022-10-02 NOTE — Hospital Course (Signed)
Shane [name]. is a 71 y.o. male with medical history significant for rectal cancer s/p LAR and end colostomy who is admitted with small bowel obstruction.

## 2022-10-02 NOTE — Assessment & Plan Note (Addendum)
S/p chemotherapy, radiation, LAR with end colostomy in 02/2021.  Follows with GI Dr. Bryan Lemma, general surgery Dr. Marcello Moores, and oncology Dr. Benay Spice.

## 2022-10-02 NOTE — Assessment & Plan Note (Signed)
CT concerning for small bowel obstruction secondary to adhesion or scarring with transition zone seen in the pelvis.  NG tube placement failed in the ED, will hold off on placing at this time as his nausea and vomiting are fairly well-controlled now. -General surgery to see -Keep n.p.o. -IV analgesics and antiemetics as needed -Continue IV fluid hydration overnight

## 2022-10-03 ENCOUNTER — Inpatient Hospital Stay (HOSPITAL_COMMUNITY): Payer: No Typology Code available for payment source

## 2022-10-03 DIAGNOSIS — K5669 Other partial intestinal obstruction: Secondary | ICD-10-CM | POA: Diagnosis not present

## 2022-10-03 DIAGNOSIS — K56609 Unspecified intestinal obstruction, unspecified as to partial versus complete obstruction: Secondary | ICD-10-CM | POA: Diagnosis not present

## 2022-10-03 DIAGNOSIS — C20 Malignant neoplasm of rectum: Secondary | ICD-10-CM | POA: Diagnosis not present

## 2022-10-03 LAB — CBC
HCT: 34 % — ABNORMAL LOW (ref 39.0–52.0)
Hemoglobin: 11.8 g/dL — ABNORMAL LOW (ref 13.0–17.0)
MCH: 30.5 pg (ref 26.0–34.0)
MCHC: 34.7 g/dL (ref 30.0–36.0)
MCV: 87.9 fL (ref 80.0–100.0)
Platelets: 203 10*3/uL (ref 150–400)
RBC: 3.87 MIL/uL — ABNORMAL LOW (ref 4.22–5.81)
RDW: 12.1 % (ref 11.5–15.5)
WBC: 5.8 10*3/uL (ref 4.0–10.5)
nRBC: 0 % (ref 0.0–0.2)

## 2022-10-03 LAB — BASIC METABOLIC PANEL
Anion gap: 8 (ref 5–15)
BUN: 17 mg/dL (ref 8–23)
CO2: 24 mmol/L (ref 22–32)
Calcium: 9.4 mg/dL (ref 8.9–10.3)
Chloride: 106 mmol/L (ref 98–111)
Creatinine, Ser: 1.16 mg/dL (ref 0.61–1.24)
GFR, Estimated: >60 mL/min (ref >60)
Glucose, Bld: 87 mg/dL (ref 70–99)
Potassium: 3.9 mmol/L (ref 3.5–5.1)
Sodium: 138 mmol/L (ref 135–145)

## 2022-10-03 MED ORDER — LACTATED RINGERS IV SOLN: INTRAVENOUS | Status: AC

## 2022-10-03 MED ORDER — SODIUM CHLORIDE 0.9% FLUSH: 10.0000 mL | INTRAVENOUS | Status: DC | PRN

## 2022-10-03 MED ORDER — CHLORHEXIDINE GLUCONATE CLOTH 2 % EX PADS
6.0000 | MEDICATED_PAD | Freq: Every day | CUTANEOUS | Status: DC
  Administered 2022-10-03 – 2022-10-07 (×5): 6 via TOPICAL

## 2022-10-03 NOTE — Plan of Care (Signed)
  Problem: Education: Goal: Knowledge of General Education information will improve Description: Including pain rating scale, medication(s)/side effects and non-pharmacologic comfort measures Outcome: Progressing   Problem: Health Behavior/Discharge Planning: Goal: Ability to manage health-related needs will improve Outcome: Progressing   Problem: Pain Managment: Goal: General experience of comfort will improve Outcome: Progressing   

## 2022-10-03 NOTE — Progress Notes (Signed)
[facility] Progress Note  Patient: Shane [name].     KVQ:[id number]  DOA: 10/02/2022   PCP: Marrian Salvage, FNP       Brief hospital course: This is a 71 year old male with a history of rectal cancer status post resection and colostomy who presents to the hospital with nausea vomiting and abdominal pain that started after Thanksgiving dinner. In the ED the patient had imaging that revealed a small bowel obstruction.  He was placed on IV fluids and admitted for further management.  General surgery evaluated him and recommended to continue conservative treatment.  Subjective:  No longer having nausea vomiting or abdominal pain.  Assessment and Plan: Principal Problem:   SBO (small bowel obstruction) ([place]) -The patient has a significant amount of stool in his colostomy and therefore obstruction may be resolving - Continue a liquid diet and advance as tolerated  Active Problems:   Intra-abdominal fluid collection -No fever or leukocytosis to suggest infection    Rectal carcinoma (HCC) -Status post resection and colostomy     Code Status: Full Code Consultants: General surgery Level of Care: Level of care: Telemetry Medical Total time on patient care: 35 minutes DVT prophylaxis:  enoxaparin (LOVENOX) injection 40 mg Start: 10/02/22 2200     Objective:   Vitals:   10/02/22 1800 10/02/22 1939 10/02/22 2259 10/03/22 0848  BP:   129/68 121/60  Pulse:  72  64  Resp:    18  Temp: 98.1 F (36.7 C) 98 F (36.7 C)  98.2 F (36.8 C)  TempSrc: Oral Oral  Oral  SpO2:  100%  99%  Weight:      Height:       Filed Weights   10/02/22 0923  Weight: 86.7 kg   Exam: General exam: Appears comfortable  HEENT: oral mucosa moist Respiratory system: Clear to auscultation.  Cardiovascular system: S1 & S2 heard  Gastrointestinal system: Abdomen soft, non-tender, nondistended.  Colostomy present with a large amount of stool-normal bowel sounds   Extremities:  No cyanosis, clubbing or edema Psychiatry:  Mood & affect appropriate.      CBC: Recent Labs  Lab 10/02/22 0943 10/03/22 0323  WBC 9.4 5.8  NEUTROABS 8.5*  --   HGB 14.8 11.8*  HCT 42.0 34.0*  MCV 87.1 87.9  PLT 272 643   Basic Metabolic Panel: Recent Labs  Lab 10/02/22 0943 10/03/22 0323  NA 139 138  K 3.9 3.9  CL 98 106  CO2 27 24  GLUCOSE 141* 87  BUN 23 17  CREATININE 1.12 1.16  CALCIUM 10.8* 9.4   GFR: Estimated Creatinine Clearance: 59.4 mL/min (by C-G formula based on SCr of 1.16 mg/dL).  Scheduled Meds:  Chlorhexidine Gluconate Cloth  6 each Topical Daily   enoxaparin (LOVENOX) injection  40 mg Subcutaneous Q24H   Continuous Infusions:  lactated ringers 100 mL/hr at 10/03/22 0808   piperacillin-tazobactam 3.375 g (10/03/22 3295)   Imaging and lab data was personally reviewed CT ABDOMEN PELVIS W CONTRAST  Result Date: 10/02/2022 CLINICAL DATA:  Acute generalized abdominal pain, vomiting. EXAM: CT ABDOMEN AND PELVIS WITH CONTRAST TECHNIQUE: Multidetector CT imaging of the abdomen and pelvis was performed using the standard protocol following bolus administration of intravenous contrast. RADIATION DOSE REDUCTION: This exam was performed according to the departmental dose-optimization program which includes automated exposure control, adjustment of the mA and/or kV according to patient size and/or use of iterative reconstruction technique. CONTRAST:  173m OMNIPAQUE IOHEXOL 300 MG/ML  SOLN COMPARISON:  September 25, 2022. FINDINGS: Lower chest: No acute abnormality. Hepatobiliary: No focal liver abnormality is seen. No gallstones, gallbladder wall thickening, or biliary dilatation. Pancreas: Unremarkable. No pancreatic ductal dilatation or surrounding inflammatory changes. Spleen: Normal in size without focal abnormality. Adrenals/Urinary Tract: Adrenal glands appear normal. Stable right renal cysts are noted for which no further follow-up is required. No  hydronephrosis or renal obstruction is noted. Urinary bladder is unremarkable. Stomach/Bowel: The stomach is unremarkable. Colostomy is noted in left lower quadrant. There is interval development of significant small bowel dilatation, with transition zone seen in the pelvis best seen on image number 35 of series 5. This may be due to adhesion or scarring. Vascular/Lymphatic: Aortic atherosclerosis. No enlarged abdominal or pelvic lymph nodes. Reproductive: Prostate is unremarkable. Bilateral hydroceles are again noted. Other: Status post surgical resection of rectum. There is again noted large soft tissue abnormality in the presacral region measuring 7.0 x 5.9 cm. There is continued presence air and fluid collection within this area measuring 3.6 x 3.2 cm. This is slightly increased compared to prior exam. Musculoskeletal: No acute or significant osseous findings. IMPRESSION: Status post rectal resection with colostomy placement and left lower quadrant. There is interval development of significant small bowel dilatation, with transition zone seen in the pelvis. This is highly concerning for obstruction secondary to adhesion or scarring. Continued presence of large soft tissue abnormality in the presacral region measuring 7.0 x 5.9 cm. It is uncertain if this represents postoperative scarring or inflammation or possibly neoplasm. 3.6 x 3.2 cm air and fluid collection is noted within this area which is increased compared to prior exam slightly; this is concerning for possible abscess. Bilateral hydroceles are again noted. Aortic Atherosclerosis (ICD10-I70.0). Electronically Signed   By: Marijo Conception M.D.   On: 10/02/2022 11:41    LOS: 1 day   Author: Debbe Odea  10/03/2022 11:20 AM  To contact [facility]>   Check the care team in [facility] and look for the attending/consulting [place] provider listed  Log into [email or web address] and use 's universal password   Go to> "[facility]"  and find  provider  If you still have difficulty reaching the provider, please page the [facility] (Director on Call) for the Hospitalists listed on amion

## 2022-10-03 NOTE — Progress Notes (Signed)
[name]., male   DOB: 01-Nov-1951, 71 y.o.   MRN: [id number] [facility] Surgery Progress Note:   * No surgery found *   THE PLAN  Ok to advance diet.  He has flatus and stool in his ostomy bag  Subjective: Mental status is clear.  Complaints none-feeling better. Objective: Vital signs in last 24 hours: Temp:  [98 F (36.7 C)-98.6 F (37 C)] 98.2 F (36.8 C) (11/25 0848) Pulse Rate:  [64-79] 64 (11/25 0848) Resp:  [14-20] 18 (11/25 0848) BP: (121-155)/(60-76) 121/60 (11/25 0848) SpO2:  [99 %-100 %] 99 % (11/25 0848)  Intake/Output from previous day: 11/24 0701 - 11/25 0700 In: 2060.8 [I.V.:910.8; IV Piggyback:1150] Out: 200 [Urine:200] Intake/Output this shift: No intake/output data recorded.  Physical Exam: Work of breathing is normal;  ostomy with flatus and stool  Lab Results:  Results for orders placed or performed during the hospital encounter of 10/02/22 (from the past 48 hour(s))  CBC with Differential     Status: Abnormal   Collection Time: 10/02/22  9:43 AM  Result Value Ref Range   WBC 9.4 4.0 - 10.5 K/uL   RBC 4.82 4.22 - 5.81 MIL/uL   Hemoglobin 14.8 13.0 - 17.0 g/dL   HCT 42.0 39.0 - 52.0 %   MCV 87.1 80.0 - 100.0 fL   MCH 30.7 26.0 - 34.0 pg   MCHC 35.2 30.0 - 36.0 g/dL   RDW 12.3 11.5 - 15.5 %   Platelets 272 150 - 400 K/uL   nRBC 0.0 0.0 - 0.2 %   Neutrophils Relative % 91 %   Neutro Abs 8.5 (H) 1.7 - 7.7 K/uL   Lymphocytes Relative 4 %   Lymphs Abs 0.4 (L) 0.7 - 4.0 K/uL   Monocytes Relative 4 %   Monocytes Absolute 0.4 0.1 - 1.0 K/uL   Eosinophils Relative 0 %   Eosinophils Absolute 0.0 0.0 - 0.5 K/uL   Basophils Relative 1 %   Basophils Absolute 0.1 0.0 - 0.1 K/uL   Immature Granulocytes 0 %   Abs Immature Granulocytes 0.03 0.00 - 0.07 K/uL    Comment: Performed at [facility], [place], [place] [address]  Comprehensive metabolic panel     Status: Abnormal   Collection Time:  10/02/22  9:43 AM  Result Value Ref Range   Sodium 139 135 - 145 mmol/L   Potassium 3.9 3.5 - 5.1 mmol/L   Chloride 98 98 - 111 mmol/L   CO2 27 22 - 32 mmol/L   Glucose, Bld 141 (H) 70 - 99 mg/dL    Comment: Glucose reference range applies only to samples taken after fasting for at least 8 hours.   BUN 23 8 - 23 mg/dL   Creatinine, Ser 1.12 0.61 - 1.24 mg/dL   Calcium 10.8 (H) 8.9 - 10.3 mg/dL   Total Protein 8.5 (H) 6.5 - 8.1 g/dL   Albumin 5.0 3.5 - 5.0 g/dL   AST 18 15 - 41 U/L   ALT 18 0 - 44 U/L   Alkaline Phosphatase 109 38 - 126 U/L   Total Bilirubin 1.0 0.3 - 1.2 mg/dL   GFR, Estimated >60 >60 mL/min    Comment: (NOTE) Calculated using the CKD-EPI Creatinine Equation (2021)    Anion gap 14 5 - 15    Comment: Performed at [facility], [place], [place] [address]  Lipase, blood     Status: None   Collection Time:  10/02/22  9:43 AM  Result Value Ref Range   Lipase 17 11 - 51 U/L    Comment: Performed at [facility], [place], [place] [address]  Lactic acid, plasma     Status: Abnormal   Collection Time: 10/02/22  9:43 AM  Result Value Ref Range   Lactic Acid, Venous 2.6 (HH) 0.5 - 1.9 mmol/L    Comment: CRITICAL RESULT CALLED TO, READ BACK BY AND VERIFIED WITHMiquel Dunn 1049 10/02/2022 DBRADLEY Performed at [facility], [address], [place], [place] [address]   Lactic acid, plasma     Status: None   Collection Time: 10/02/22 11:59 AM  Result Value Ref Range   Lactic Acid, Venous 1.7 0.5 - 1.9 mmol/L    Comment: Performed at [facility], [address] [place], [place] [address]  SARS Coronavirus 2 by RT PCR (hospital order, performed in [facility] hospital lab) *cepheid single result test* Anterior Nasal Swab     Status: None   Collection Time: 10/02/22 12:32 PM   Specimen: Anterior Nasal Swab  Result Value Ref Range   SARS Coronavirus 2 by RT  PCR NEGATIVE NEGATIVE    Comment: (NOTE) SARS-CoV-2 target nucleic acids are NOT DETECTED.  The SARS-CoV-2 RNA is generally detectable in upper and lower respiratory specimens during the acute phase of infection. The lowest concentration of SARS-CoV-2 viral copies this assay can detect is 250 copies / mL. A negative result does not preclude SARS-CoV-2 infection and should not be used as the sole basis for treatment or other patient management decisions.  A negative result may occur with improper specimen collection / handling, submission of specimen other than nasopharyngeal swab, presence of viral mutation(s) within the areas targeted by this assay, and inadequate number of viral copies (<250 copies / mL). A negative result must be combined with clinical observations, patient history, and epidemiological information.  Fact Sheet for Patients:   https://www.patel.info/  Fact Sheet for Healthcare Providers: https://hall.com/  This test is not yet approved or  cleared by the [place] FDA and has been authorized for detection and/or diagnosis of SARS-CoV-2 by FDA under an Emergency Use Authorization (EUA).  This EUA will remain in effect (meaning this test can be used) for the duration of the COVID-19 declaration under Section 564(b)(1) of the Act, 21 U.S.C. section 360bbb-3(b)(1), unless the authorization is terminated or revoked sooner.  Performed at [facility], [address], [place], [place] [address]   Urinalysis, Routine w reflex microscopic Urine, Clean Catch     Status: Abnormal   Collection Time: 10/02/22  1:15 PM  Result Value Ref Range   Color, Urine YELLOW YELLOW   APPearance CLEAR CLEAR   Specific Gravity, Urine >1.046 (H) 1.005 - 1.030   pH 8.0 5.0 - 8.0   Glucose, UA NEGATIVE NEGATIVE mg/dL   Hgb urine dipstick MODERATE (A) NEGATIVE   Bilirubin Urine NEGATIVE NEGATIVE   Ketones, ur TRACE (A)  NEGATIVE mg/dL   Protein, ur TRACE (A) NEGATIVE mg/dL   Nitrite NEGATIVE NEGATIVE   Leukocytes,Ua NEGATIVE NEGATIVE   RBC / HPF 11-20 0 - 5 RBC/hpf   WBC, UA 0-5 0 - 5 WBC/hpf   Squamous Epithelial / LPF 0-5 0 - 5   Mucus PRESENT     Comment: Performed at [facility], [address] [place], [place] [address]  Basic metabolic panel     Status: None   Collection Time: 10/03/22  3:23  AM  Result Value Ref Range   Sodium 138 135 - 145 mmol/L   Potassium 3.9 3.5 - 5.1 mmol/L   Chloride 106 98 - 111 mmol/L   CO2 24 22 - 32 mmol/L   Glucose, Bld 87 70 - 99 mg/dL    Comment: Glucose reference range applies only to samples taken after fasting for at least 8 hours.   BUN 17 8 - 23 mg/dL   Creatinine, Ser 1.16 0.61 - 1.24 mg/dL   Calcium 9.4 8.9 - 10.3 mg/dL   GFR, Estimated >60 >60 mL/min    Comment: (NOTE) Calculated using the CKD-EPI Creatinine Equation (2021)    Anion gap 8 5 - 15    Comment: Performed at [facility] [address] [place], [place] [address]  CBC     Status: Abnormal   Collection Time: 10/03/22  3:23 AM  Result Value Ref Range   WBC 5.8 4.0 - 10.5 K/uL   RBC 3.87 (L) 4.22 - 5.81 MIL/uL   Hemoglobin 11.8 (L) 13.0 - 17.0 g/dL   HCT 34.0 (L) 39.0 - 52.0 %   MCV 87.9 80.0 - 100.0 fL   MCH 30.5 26.0 - 34.0 pg   MCHC 34.7 30.0 - 36.0 g/dL   RDW 12.1 11.5 - 15.5 %   Platelets 203 150 - 400 K/uL   nRBC 0.0 0.0 - 0.2 %    Comment: Performed at [facility], [place] [address], [place], [place] [address]    Radiology/Results: CT ABDOMEN PELVIS W CONTRAST  Result Date: 10/02/2022 CLINICAL DATA:  Acute generalized abdominal pain, vomiting. EXAM: CT ABDOMEN AND PELVIS WITH CONTRAST TECHNIQUE: Multidetector CT imaging of the abdomen and pelvis was performed using the standard protocol following bolus administration of intravenous contrast. RADIATION DOSE REDUCTION: This exam was performed according to the departmental dose-optimization  program which includes automated exposure control, adjustment of the mA and/or kV according to patient size and/or use of iterative reconstruction technique. CONTRAST:  177m OMNIPAQUE IOHEXOL 300 MG/ML  SOLN COMPARISON:  September 25, 2022. FINDINGS: Lower chest: No acute abnormality. Hepatobiliary: No focal liver abnormality is seen. No gallstones, gallbladder wall thickening, or biliary dilatation. Pancreas: Unremarkable. No pancreatic ductal dilatation or surrounding inflammatory changes. Spleen: Normal in size without focal abnormality. Adrenals/Urinary Tract: Adrenal glands appear normal. Stable right renal cysts are noted for which no further follow-up is required. No hydronephrosis or renal obstruction is noted. Urinary bladder is unremarkable. Stomach/Bowel: The stomach is unremarkable. Colostomy is noted in left lower quadrant. There is interval development of significant small bowel dilatation, with transition zone seen in the pelvis best seen on image number 35 of series 5. This may be due to adhesion or scarring. Vascular/Lymphatic: Aortic atherosclerosis. No enlarged abdominal or pelvic lymph nodes. Reproductive: Prostate is unremarkable. Bilateral hydroceles are again noted. Other: Status post surgical resection of rectum. There is again noted large soft tissue abnormality in the presacral region measuring 7.0 x 5.9 cm. There is continued presence air and fluid collection within this area measuring 3.6 x 3.2 cm. This is slightly increased compared to prior exam. Musculoskeletal: No acute or significant osseous findings. IMPRESSION: Status post rectal resection with colostomy placement and left lower quadrant. There is interval development of significant small bowel dilatation, with transition zone seen in the pelvis. This is highly concerning for obstruction secondary to adhesion or scarring. Continued presence of large soft tissue abnormality in the presacral region measuring 7.0 x 5.9 cm. It is  uncertain if this represents postoperative scarring or inflammation or possibly neoplasm. 3.6 x 3.2 cm air and fluid collection is noted within this area which is increased compared to prior exam slightly; this is concerning for possible abscess. Bilateral hydroceles are again noted. Aortic Atherosclerosis (ICD10-I70.0). Electronically Signed   By: Marijo Conception M.D.   On: 10/02/2022 11:41    Anti-infectives: Anti-infectives (From admission, onward)    Start     Dose/Rate Route Frequency Ordered Stop   10/02/22 1915  piperacillin-tazobactam (ZOSYN) IVPB 3.375 g        3.375 g 100 mL/hr over 30 Minutes Intravenous Every 6 hours 10/02/22 1822     10/02/22 1115  piperacillin-tazobactam (ZOSYN) IVPB 3.375 g        3.375 g 100 mL/hr over 30 Minutes Intravenous  Once 10/02/22 1108 10/02/22 1224       Assessment/Plan: Problem List: Patient Active Problem List   Diagnosis Date Noted   SBO (small bowel obstruction) ([place]) 10/02/2022   Intra-abdominal fluid collection 10/02/2022   Pressure injury of skin 03/25/2021   Colostomy in place [facility]) 03/19/2021   Hyperglycemia 03/14/2021   Hypernatremia 03/13/2021   Acute upper GI bleeding 03/13/2021   Enterococcal sepsis ([place]) 03/13/2021   Hypokalemia 03/12/2021   Malnutrition of moderate degree 03/11/2021   Anemia    Hypotension    Tachypnea 03/09/2021   Positive D dimer 03/09/2021   Acute respiratory distress    Ileus ([employer])    AKI (acute kidney injury) ([place]) 03/08/2021   Hemorrhoids, internal 03/08/2021   Hypercholesteremia 03/08/2021   Pulmonary nodules 03/08/2021   Hypoxia 03/08/2021   Phimosis of penis s/p dilitation 03/05/2021 03/08/2021   Delirium 03/07/2021   Port-A-Cath in place 12/10/2020   Rectal carcinoma ([place]) 07/18/2020   Diarrhea 07/09/2020   Iron deficiency anemia 07/04/2020   Goals of care, counseling/discussion 07/04/2020   S/P carpal tunnel release 01/30/2020   Right carpal tunnel syndrome 09/19/2018    Osteoarthritis of spine with radiculopathy, 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  Could recurrent tumor be producing partial SBO? * No surgery found *    LOS: 1 day   Matt B. Hassell Done, MD, [facility] [phone] to reach the surgeon on call.    10/03/2022 11:29 AM

## 2022-10-04 DIAGNOSIS — K56609 Unspecified intestinal obstruction, unspecified as to partial versus complete obstruction: Secondary | ICD-10-CM | POA: Diagnosis not present

## 2022-10-04 MED ORDER — PIPERACILLIN-TAZOBACTAM 3.375 G IVPB
3.3750 g | Freq: Three times a day (TID) | INTRAVENOUS | Status: DC
  Administered 2022-10-04 – 2022-10-07 (×9): 3.375 g via INTRAVENOUS
  Filled 2022-10-04 (×9): qty 50

## 2022-10-04 MED ORDER — MORPHINE SULFATE (PF) 2 MG/ML IV SOLN
1.0000 mg | INTRAVENOUS | Status: DC | PRN
  Administered 2022-10-04 – 2022-10-05 (×3): 2 mg via INTRAVENOUS
  Filled 2022-10-04 (×3): qty 1

## 2022-10-04 MED ORDER — PROCHLORPERAZINE EDISYLATE 10 MG/2ML IJ SOLN
5.0000 mg | Freq: Once | INTRAMUSCULAR | Status: AC
  Administered 2022-10-04: 5 mg via INTRAVENOUS
  Filled 2022-10-04: qty 2

## 2022-10-04 NOTE — Plan of Care (Signed)
  Problem: Education: Goal: Knowledge of General Education information will improve Description: Including pain rating scale, medication(s)/side effects and non-pharmacologic comfort measures Outcome: Progressing   Problem: Health Behavior/Discharge Planning: Goal: Ability to manage health-related needs will improve Outcome: Progressing   Problem: Nutrition: Goal: Adequate nutrition will be maintained Outcome: Progressing   Problem: Elimination: Goal: Will not experience complications related to bowel motility Outcome: Progressing   Problem: Pain Managment: Goal: General experience of comfort will improve Outcome: Progressing

## 2022-10-04 NOTE — Progress Notes (Signed)
Patient ID: Shane Zagami., male   DOB: May 18, 1951, 71 y.o.   MRN: [id number] [facility] Surgery Progress Note:   * No surgery found *   THE PLAN  Advance to full liquid diet Hopeful discharge soon and followup with Dr. Benay Spice to review recent CT pelvis   Subjective: Mental status is clear.  Complaints none-improved. Objective: Vital signs in last 24 hours: Temp:  [98 F (36.7 C)-98.2 F (36.8 C)] 98.1 F (36.7 C) (11/26 0716) Pulse Rate:  [63-71] 71 (11/26 0716) Resp:  [16-18] 16 (11/26 0716) BP: (126-137)/(55-61) 137/57 (11/26 0716) SpO2:  [100 %] 100 % (11/26 0350)  Intake/Output from previous day: 11/25 0701 - 11/26 0700 In: 1064.9 [I.V.:914.9; IV Piggyback:150] Out: 600 [Urine:600] Intake/Output this shift: No intake/output data recorded.  Physical Exam: Work of breathing is normal.  Ostomy output good.  Feeling better.  Advance diet  Lab Results:  Results for orders placed or performed during the hospital encounter of 10/02/22 (from the past 48 hour(s))  Lactic acid, plasma     Status: None   Collection Time: 10/02/22 11:59 AM  Result Value Ref Range   Lactic Acid, Venous 1.7 0.5 - 1.9 mmol/L    Comment: Performed at [facility], [address] [place], [place] [address]  SARS Coronavirus 2 by RT PCR (hospital order, performed in [facility] hospital lab) *cepheid single result test* Anterior Nasal Swab     Status: None   Collection Time: 10/02/22 12:32 PM   Specimen: Anterior Nasal Swab  Result Value Ref Range   SARS Coronavirus 2 by RT PCR NEGATIVE NEGATIVE    Comment: (NOTE) SARS-CoV-2 target nucleic acids are NOT DETECTED.  The SARS-CoV-2 RNA is generally detectable in upper and lower respiratory specimens during the acute phase of infection. The lowest concentration of SARS-CoV-2 viral copies this assay can detect is 250 copies / mL. A negative result does not preclude SARS-CoV-2 infection and should not be used as the  sole basis for treatment or other patient management decisions.  A negative result may occur with improper specimen collection / handling, submission of specimen other than nasopharyngeal swab, presence of viral mutation(s) within the areas targeted by this assay, and inadequate number of viral copies (<250 copies / mL). A negative result must be combined with clinical observations, patient history, and epidemiological information.  Fact Sheet for Patients:   https://www.patel.info/  Fact Sheet for Healthcare Providers: https://hall.com/  This test is not yet approved or  cleared by the [place] FDA and has been authorized for detection and/or diagnosis of SARS-CoV-2 by FDA under an Emergency Use Authorization (EUA).  This EUA will remain in effect (meaning this test can be used) for the duration of the COVID-19 declaration under Section 564(b)(1) of the Act, 21 U.S.C. section 360bbb-3(b)(1), unless the authorization is terminated or revoked sooner.  Performed at [facility], [address], [place], [place] [address]   Urinalysis, Routine w reflex microscopic Urine, Clean Catch     Status: Abnormal   Collection Time: 10/02/22  1:15 PM  Result Value Ref Range   Color, Urine YELLOW YELLOW   APPearance CLEAR CLEAR   Specific Gravity, Urine >1.046 (H) 1.005 - 1.030   pH 8.0 5.0 - 8.0   Glucose, UA NEGATIVE NEGATIVE mg/dL   Hgb urine dipstick MODERATE (A) NEGATIVE   Bilirubin Urine NEGATIVE NEGATIVE   Ketones, ur TRACE (A) NEGATIVE mg/dL   Protein, ur TRACE (A) NEGATIVE mg/dL   Nitrite  NEGATIVE NEGATIVE   Leukocytes,Ua NEGATIVE NEGATIVE   RBC / HPF 11-20 0 - 5 RBC/hpf   WBC, UA 0-5 0 - 5 WBC/hpf   Squamous Epithelial / LPF 0-5 0 - 5   Mucus PRESENT     Comment: Performed at [facility], [address], [place], [place] [address]  Basic metabolic panel     Status: None   Collection Time:  10/03/22  3:23 AM  Result Value Ref Range   Sodium 138 135 - 145 mmol/L   Potassium 3.9 3.5 - 5.1 mmol/L   Chloride 106 98 - 111 mmol/L   CO2 24 22 - 32 mmol/L   Glucose, Bld 87 70 - 99 mg/dL    Comment: Glucose reference range applies only to samples taken after fasting for at least 8 hours.   BUN 17 8 - 23 mg/dL   Creatinine, Ser 1.16 0.61 - 1.24 mg/dL   Calcium 9.4 8.9 - 10.3 mg/dL   GFR, Estimated >60 >60 mL/min    Comment: (NOTE) Calculated using the CKD-EPI Creatinine Equation (2021)    Anion gap 8 5 - 15    Comment: Performed at [facility] [address], [place], [place] [address]  CBC     Status: Abnormal   Collection Time: 10/03/22  3:23 AM  Result Value Ref Range   WBC 5.8 4.0 - 10.5 K/uL   RBC 3.87 (L) 4.22 - 5.81 MIL/uL   Hemoglobin 11.8 (L) 13.0 - 17.0 g/dL   HCT 34.0 (L) 39.0 - 52.0 %   MCV 87.9 80.0 - 100.0 fL   MCH 30.5 26.0 - 34.0 pg   MCHC 34.7 30.0 - 36.0 g/dL   RDW 12.1 11.5 - 15.5 %   Platelets 203 150 - 400 K/uL   nRBC 0.0 0.0 - 0.2 %    Comment: Performed at [facility], [place] [address] [place], [place] [address]    Radiology/Results: DG Abd 1 View  Result Date: 10/03/2022 CLINICAL DATA:  Small bowel obstruction. EXAM: ABDOMEN - 1 VIEW COMPARISON:  CT scan of the abdomen and pelvis October 02, 2022 FINDINGS: Contrast identified throughout the length of the colon. Continued small bowel obstruction with dilated small bowel loops. This film is not significantly different compared to yesterday scout film. Lung bases are normal. No free air, portal venous gas, or pneumatosis identified. IMPRESSION: Continued small bowel obstruction as above. Electronically Signed   By: Dorise Bullion III M.D.   On: 10/03/2022 11:55   CT ABDOMEN PELVIS W CONTRAST  Result Date: 10/02/2022 CLINICAL DATA:  Acute generalized abdominal pain, vomiting. EXAM: CT ABDOMEN AND PELVIS WITH CONTRAST TECHNIQUE: Multidetector CT imaging of the abdomen and pelvis was  performed using the standard protocol following bolus administration of intravenous contrast. RADIATION DOSE REDUCTION: This exam was performed according to the departmental dose-optimization program which includes automated exposure control, adjustment of the mA and/or kV according to patient size and/or use of iterative reconstruction technique. CONTRAST:  165m OMNIPAQUE IOHEXOL 300 MG/ML  SOLN COMPARISON:  September 25, 2022. FINDINGS: Lower chest: No acute abnormality. Hepatobiliary: No focal liver abnormality is seen. No gallstones, gallbladder wall thickening, or biliary dilatation. Pancreas: Unremarkable. No pancreatic ductal dilatation or surrounding inflammatory changes. Spleen: Normal in size without focal abnormality. Adrenals/Urinary Tract: Adrenal glands appear normal. Stable right renal cysts are noted for which no further follow-up is required. No hydronephrosis or renal obstruction is noted. Urinary bladder is unremarkable. Stomach/Bowel: The stomach is unremarkable.  Colostomy is noted in left lower quadrant. There is interval development of significant small bowel dilatation, with transition zone seen in the pelvis best seen on image number 35 of series 5. This may be due to adhesion or scarring. Vascular/Lymphatic: Aortic atherosclerosis. No enlarged abdominal or pelvic lymph nodes. Reproductive: Prostate is unremarkable. Bilateral hydroceles are again noted. Other: Status post surgical resection of rectum. There is again noted large soft tissue abnormality in the presacral region measuring 7.0 x 5.9 cm. There is continued presence air and fluid collection within this area measuring 3.6 x 3.2 cm. This is slightly increased compared to prior exam. Musculoskeletal: No acute or significant osseous findings. IMPRESSION: Status post rectal resection with colostomy placement and left lower quadrant. There is interval development of significant small bowel dilatation, with transition zone seen in the  pelvis. This is highly concerning for obstruction secondary to adhesion or scarring. Continued presence of large soft tissue abnormality in the presacral region measuring 7.0 x 5.9 cm. It is uncertain if this represents postoperative scarring or inflammation or possibly neoplasm. 3.6 x 3.2 cm air and fluid collection is noted within this area which is increased compared to prior exam slightly; this is concerning for possible abscess. Bilateral hydroceles are again noted. Aortic Atherosclerosis (ICD10-I70.0). Electronically Signed   By: Marijo Conception M.D.   On: 10/02/2022 11:41    Anti-infectives: Anti-infectives (From admission, onward)    Start     Dose/Rate Route Frequency Ordered Stop   10/04/22 1200  piperacillin-tazobactam (ZOSYN) IVPB 3.375 g        3.375 g 12.5 mL/hr over 240 Minutes Intravenous Every 8 hours 10/04/22 0710     10/02/22 1915  piperacillin-tazobactam (ZOSYN) IVPB 3.375 g  Status:  Discontinued        3.375 g 100 mL/hr over 30 Minutes Intravenous Every 6 hours 10/02/22 1822 10/04/22 0710   10/02/22 1115  piperacillin-tazobactam (ZOSYN) IVPB 3.375 g        3.375 g 100 mL/hr over 30 Minutes Intravenous  Once 10/02/22 1108 10/02/22 1224       Assessment/Plan: Problem List: Patient Active Problem List   Diagnosis Date Noted   SBO (small bowel obstruction) ([place]) 10/02/2022   Intra-abdominal fluid collection 10/02/2022   Pressure injury of skin 03/25/2021   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   Hyperglycemia 03/14/2021   Hypernatremia 03/13/2021   Acute upper GI bleeding 03/13/2021   Enterococcal sepsis ([place]) 03/13/2021   Hypokalemia 03/12/2021   Malnutrition of moderate degree 03/11/2021   Anemia    Hypotension    Tachypnea 03/09/2021   Positive D dimer 03/09/2021   Acute respiratory distress    Ileus ([place])    AKI (acute kidney injury) ([place]) 03/08/2021   Hemorrhoids, internal 03/08/2021   Hypercholesteremia 03/08/2021   Pulmonary nodules 03/08/2021    Hypoxia 03/08/2021   Phimosis of penis s/p dilitation 03/05/2021 03/08/2021   Delirium 03/07/2021   Port-A-Cath in place 12/10/2020   Rectal carcinoma ([place]) 07/18/2020   Diarrhea 07/09/2020   Iron deficiency anemia 07/04/2020   Goals of care, counseling/discussion 07/04/2020   S/P carpal tunnel release 01/30/2020   Right carpal tunnel syndrome 09/19/2018   Osteoarthritis of spine with radiculopathy, 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   *  No surgery found *    LOS: 2 days   Matt B. Hassell Done, MD, [facility] [phone] to reach the surgeon on call.    10/04/2022 10:06 AM

## 2022-10-04 NOTE — Plan of Care (Signed)
  Problem: Nutrition: Goal: Adequate nutrition will be maintained Outcome: Progressing   Problem: Elimination: Goal: Will not experience complications related to bowel motility Outcome: Progressing   

## 2022-10-04 NOTE — Progress Notes (Signed)
[facility] Progress Note  Patient: Shane [name].     JQB:[id number]  DOA: 10/02/2022   PCP: Marrian Salvage, FNP       Brief hospital course: This is a 71 year old male with a history of rectal cancer status post resection and colostomy who presents to the hospital with nausea vomiting and abdominal pain that started after Thanksgiving dinner. In the ED the patient had imaging that revealed a small bowel obstruction.  He was placed on IV fluids and admitted for further management.  General surgery evaluated him and recommended to continue conservative treatment.  Subjective:  No complaints. He tolerated full liquids at lunch today.   Assessment and Plan: Principal Problem:   SBO (small bowel obstruction) ([place]) -The patient has a significant amount of stool in his colostomy and therefore obstruction may be resolving - advance to solid food today and follow- home tomorrow if he has no issues  Active Problems:   Intra-abdominal fluid collection -No fever or leukocytosis to suggest infection    Rectal carcinoma ([facility]) -Status post resection and colostomy     Code Status: Full Code Consultants: General surgery Level of Care: Level of care: Telemetry Medical Total time on patient care: 35 minutes DVT prophylaxis:  enoxaparin (LOVENOX) injection 40 mg Start: 10/02/22 2200     Objective:   Vitals:   10/03/22 0848 10/03/22 2051 10/04/22 0350 10/04/22 0716  BP: 121/60 126/61 (!) 129/55 (!) 137/57  Pulse: 64 64 63 71  Resp: '18 18 17 16  '$ Temp: 98.2 F (36.8 C) 98.2 F (36.8 C) 98 F (36.7 C) 98.1 F (36.7 C)  TempSrc: Oral Oral Oral Oral  SpO2: 99% 100% 100%   Weight:      Height:       Filed Weights   10/02/22 0923  Weight: 86.7 kg   Exam: General exam: Appears comfortable  HEENT: oral mucosa moist Respiratory system: Clear to auscultation.  Cardiovascular system: S1 & S2 heard  Gastrointestinal system: Abdomen soft, non-tender,  nondistended. Normal bowel sounds  - stool noted in colostomy Extremities: No cyanosis, clubbing or edema Psychiatry:  Mood & affect appropriate.       CBC: Recent Labs  Lab 10/02/22 0943 10/03/22 0323  WBC 9.4 5.8  NEUTROABS 8.5*  --   HGB 14.8 11.8*  HCT 42.0 34.0*  MCV 87.1 87.9  PLT 272 409    Basic Metabolic Panel: Recent Labs  Lab 10/02/22 0943 10/03/22 0323  NA 139 138  K 3.9 3.9  CL 98 106  CO2 27 24  GLUCOSE 141* 87  BUN 23 17  CREATININE 1.12 1.16  CALCIUM 10.8* 9.4    GFR: Estimated Creatinine Clearance: 59.4 mL/min (by C-G formula based on SCr of 1.16 mg/dL).  Scheduled Meds:  Chlorhexidine Gluconate Cloth  6 each Topical Daily   enoxaparin (LOVENOX) injection  40 mg Subcutaneous Q24H   Continuous Infusions:  piperacillin-tazobactam (ZOSYN)  IV 3.375 g (10/04/22 1220)   Imaging and lab data was personally reviewed DG Abd 1 View  Result Date: 10/03/2022 CLINICAL DATA:  Small bowel obstruction. EXAM: ABDOMEN - 1 VIEW COMPARISON:  CT scan of the abdomen and pelvis October 02, 2022 FINDINGS: Contrast identified throughout the length of the colon. Continued small bowel obstruction with dilated small bowel loops. This film is not significantly different compared to yesterday scout film. Lung bases are normal. No free air, portal venous gas, or pneumatosis identified. IMPRESSION: Continued small bowel obstruction as above. Electronically Signed  By: Dorise Bullion III M.D.   On: 10/03/2022 11:55    LOS: 2 days   Author: Debbe Odea  10/04/2022 4:25 PM  To contact [facility]>   Check the care team in [facility] and look for the attending/consulting [place] provider listed  Log into [email or web address] and use [place]'s universal password   Go to> "[facility]"  and find provider  If you still have difficulty reaching the provider, please page the [facility] (Director on Call) for the Hospitalists listed on amion

## 2022-10-05 ENCOUNTER — Inpatient Hospital Stay (HOSPITAL_COMMUNITY): Payer: No Typology Code available for payment source

## 2022-10-05 DIAGNOSIS — K56609 Unspecified intestinal obstruction, unspecified as to partial versus complete obstruction: Secondary | ICD-10-CM | POA: Diagnosis not present

## 2022-10-05 DIAGNOSIS — C20 Malignant neoplasm of rectum: Secondary | ICD-10-CM | POA: Diagnosis not present

## 2022-10-05 DIAGNOSIS — K5669 Other partial intestinal obstruction: Secondary | ICD-10-CM | POA: Diagnosis not present

## 2022-10-05 MED ORDER — PROSOURCE PLUS PO LIQD
30.0000 mL | Freq: Three times a day (TID) | ORAL | Status: DC
  Administered 2022-10-05 – 2022-10-07 (×5): 30 mL via ORAL
  Filled 2022-10-05 (×5): qty 30

## 2022-10-05 MED ORDER — BOOST / RESOURCE BREEZE PO LIQD CUSTOM
1.0000 | Freq: Three times a day (TID) | ORAL | Status: DC
  Administered 2022-10-05 – 2022-10-06 (×3): 1 via ORAL
  Filled 2022-10-05: qty 1

## 2022-10-05 NOTE — Progress Notes (Signed)
Patient complaining of severe pain similar to pre hospital admission and nausea, had 1 time rusty colored emesis with some food particle. Kathryne Eriksson, NP notified. Medication done according to the order. Patient says he is a little bit relieved now and is sleeping, will continue to monitor the patient.

## 2022-10-05 NOTE — Progress Notes (Signed)
[facility] Progress Note  Patient: Shane [name].     KGU:[id number]  DOA: 10/02/2022   PCP: Marrian Salvage, FNP       Brief hospital course: This is a 71 year old male with a history of rectal cancer status post resection and colostomy who presents to the hospital with nausea vomiting and abdominal pain that started after Thanksgiving dinner. In the ED the patient had imaging that revealed a small bowel obstruction.  He was placed on IV fluids and admitted for further management.  General surgery evaluated him and recommended to continue conservative treatment.  Subjective:  Having abdominal pain.  Assessment and Plan: Principal Problem:   SBO (small bowel obstruction) ([place]) -The patient has a significant amount of stool in his colostomy and therefore obstruction may be resolving - downgraded to full liquids today by general surgery  Active Problems:   Intra-abdominal fluid collection -No fever or leukocytosis to suggest infection    Rectal carcinoma ([place]) -Status post resection and colostomy     Code Status: Full Code Consultants: General surgery Level of Care: Level of care: Telemetry Medical Total time on patient care: 35 minutes DVT prophylaxis:  enoxaparin (LOVENOX) injection 40 mg Start: 10/02/22 2200     Objective:   Vitals:   10/04/22 0350 10/04/22 0716 10/04/22 2104 10/05/22 0822  BP: (!) 129/55 (!) 137/57 (!) 156/72 (!) 117/54  Pulse: 63 71 68 65  Resp: 17 16    Temp: 98 F (36.7 C) 98.1 F (36.7 C) 98.4 F (36.9 C) 98.3 F (36.8 C)  TempSrc: Oral Oral Oral Oral  SpO2: 100%  100% 100%  Weight:      Height:       Filed Weights   10/02/22 0923  Weight: 86.7 kg   Exam: General exam: Appears comfortable  HEENT: oral mucosa moist Respiratory system: Clear to auscultation.  Cardiovascular system: S1 & S2 heard  Gastrointestinal system: Abdomen soft, non-tender, nondistended. Normal bowel sounds  - stool noted in  colostomy Extremities: No cyanosis, clubbing or edema Psychiatry:  Mood & affect appropriate.       CBC: Recent Labs  Lab 10/02/22 0943 10/03/22 0323  WBC 9.4 5.8  NEUTROABS 8.5*  --   HGB 14.8 11.8*  HCT 42.0 34.0*  MCV 87.1 87.9  PLT 272 628    Basic Metabolic Panel: Recent Labs  Lab 10/02/22 0943 10/03/22 0323  NA 139 138  K 3.9 3.9  CL 98 106  CO2 27 24  GLUCOSE 141* 87  BUN 23 17  CREATININE 1.12 1.16  CALCIUM 10.8* 9.4    GFR: Estimated Creatinine Clearance: 59.4 mL/min (by C-G formula based on SCr of 1.16 mg/dL).  Scheduled Meds:  Chlorhexidine Gluconate Cloth  6 each Topical Daily   enoxaparin (LOVENOX) injection  40 mg Subcutaneous Q24H   Continuous Infusions:  piperacillin-tazobactam (ZOSYN)  IV 3.375 g (10/05/22 0550)   Imaging and lab data was personally reviewed DG Abd Portable 1V  Result Date: 10/05/2022 CLINICAL DATA:  Small-bowel obstruction EXAM: PORTABLE ABDOMEN - 1 VIEW COMPARISON:  Radiograph 10/03/2022, CT 10/03/2019 FINDINGS: Persistent small bowel dilation in the lower abdomen. Contrast material retained within the colon, unchanged. IMPRESSION: Persistent small bowel dilation concerning for small bowel obstruction. Electronically Signed   By: Maurine Simmering M.D.   On: 10/05/2022 10:58    LOS: 3 days   Author: Eunice Blase Jama Mcmiller  10/05/2022 12:51 PM  To contact [facility]>   Check the care team in [facility] and  look for the attending/consulting [facility] provider listed  Log into [email or web address] and use [place]'s universal password   Go to> "[facility]"  and find provider  If you still have difficulty reaching the provider, please page the [facility] (Director on Call) for the Hospitalists listed on amion

## 2022-10-05 NOTE — Plan of Care (Signed)
  Problem: Activity: Goal: Risk for activity intolerance will decrease Outcome: Progressing   Problem: Nutrition: Goal: Adequate nutrition will be maintained Outcome: Progressing   Problem: Coping: Goal: Level of anxiety will decrease Outcome: Progressing   Problem: Elimination: Goal: Will not experience complications related to bowel motility Outcome: Progressing   Problem: Pain Managment: Goal: General experience of comfort will improve Outcome: Progressing   

## 2022-10-05 NOTE — Consult Note (Signed)
Chief Complaint: Patient was seen in consultation today for  Chief Complaint  Patient presents with   Abdominal Pain   at the request of General Surgery  Referring Physician(s): Richard Miu, [place]  Supervising Physician: Michaelle Birks  Patient Status: [facility] - In-pt  History of Present Illness: Shane Trivedi. is a 71 y.o. male with a history of rectal cancer status post resection and colostomy who presented to the hospital with nausea vomiting and abdominal pain.  Imaging was suggestive of small bowel resection and has been treated conservatively.  Patient is afebrile and no WBC.  Patient is now moving bowels.  IR consulted due to "large soft tissue abnormality" and concern for abscess.  Past Medical History:  Diagnosis Date   Allergy    Arthritis    knee   Rectal cancer ([place]) 08/2020    Past Surgical History:  Procedure Laterality Date   ESOPHAGOGASTRODUODENOSCOPY (EGD) WITH PROPOFOL N/A 03/14/2021   Procedure: ESOPHAGOGASTRODUODENOSCOPY (EGD) WITH PROPOFOL;  Surgeon: Lavena Bullion, DO;  Location: [facility];  Service: Gastroenterology;  Laterality: N/A;   IR CATHETER TUBE CHANGE  07/29/2021   IR IMAGING GUIDED PORT INSERTION  07/25/2020   IR RADIOLOGIST EVAL & MGMT  04/22/2021   IR RADIOLOGIST EVAL & MGMT  05/07/2021   IR RADIOLOGIST EVAL & MGMT  07/24/2021   IR RADIOLOGIST EVAL & MGMT  09/17/2021   IR SINUS/FIST TUBE CHK-NON GI  04/10/2021   IR SINUS/FIST TUBE CHK-NON GI  04/10/2021   IR US GUIDE BX ASP/DRAIN  03/11/2021   OSTOMY N/A 03/05/2021   Procedure: OSTOMY;  Surgeon: Leighton Ruff, MD;  Location: [facility];  Service: General;  Laterality: N/A;   WISDOM TOOTH EXTRACTION     XI ROBOTIC ASSISTED LOWER ANTERIOR RESECTION N/A 03/05/2021   Procedure: XI ROBOTIC ASSISTED LOWER ANTERIOR RESECTION WITH MOBILIZATION OF THE SPLENIC FLEXURE OF THE COLON AND END COLOSTOMY;  Surgeon: Leighton Ruff, MD;  Location: [facility];  Service: General;  Laterality: N/A;  6 HOURS     Allergies: Patient has no known allergies.  Medications: Prior to Admission medications   Medication Sig Start Date End Date Taking? Authorizing Provider  diclofenac (VOLTAREN) 75 MG EC tablet Take 75 mg by mouth 3 times/day as needed-between meals & bedtime (pain). 05/18/22  Yes [provider]  furosemide (LASIX) 20 MG tablet Take 1 tablet (20 mg total) by mouth daily as needed for fluid. Patient taking differently: Take 20 mg by mouth in the morning. 06/29/22  Yes Marrian Salvage, FNP  naproxen sodium (ALEVE) 220 MG tablet Take 440 mg by mouth daily as needed (pain).   Yes [provider]  pravastatin (PRAVACHOL) 20 MG tablet TAKE 1 TABLET BY MOUTH EVERY DAY Patient taking differently: Take 20 mg by mouth in the morning. 09/16/22  Yes Marrian Salvage, FNP  valsartan (DIOVAN) 80 MG tablet Take 1 tablet (80 mg total) by mouth daily. Patient taking differently: Take 80 mg by mouth in the morning. 07/17/22  Yes Marrian Salvage, FNP     Family History  Problem Relation Age of Onset   Healthy Mother    Hypertension Father    Stroke Father    Colon cancer Neg Hx    Esophageal cancer Neg Hx    Stomach cancer Neg Hx    Rectal cancer Neg Hx     Social History   Socioeconomic History   Marital status: Married    Spouse name: Not on file  Number of children: 2   Years of education: 14   Highest education level: Not on file  Occupational History   Occupation: Armed forces technical officer    Comment: retired  Tobacco Use   Smoking status: Former    Packs/day: 1.00    Years: 10.00    Total pack years: 10.00    Types: Cigarettes    Quit date: 08/09/1978    Years since quitting: 44.1   Smokeless tobacco: Never  Vaping Use   Vaping Use: Never used  Substance and Sexual Activity   Alcohol use: No    Alcohol/week: 0.0 standard drinks of alcohol   Drug use: No   Sexual activity: Not Currently  Other Topics Concern   Not on file  Social History  Narrative   Fun: [name] out with his grandkids, 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   Social Determinants of Radio broadcast assistant Strain: [place]  (02/23/2022)   Overall Financial Resource Strain (CARDIA)    Difficulty of Paying Living Expenses: Not hard at all  Food Insecurity: No Food Insecurity (02/23/2022)   Hunger Vital Sign    Worried About Running Out of Food in the Last Year: Never true    [place] in the Last Year: Never true  Transportation Needs: No Transportation Needs (02/23/2022)   PRAPARE - Hydrologist (Medical): No    Lack of Transportation (Non-Medical): No  Physical Activity: Sufficiently Active (02/23/2022)   Exercise Vital Sign    Days of Exercise per Week: 5 days    Minutes of Exercise per Session: 90 min  Stress: No Stress Concern Present (02/23/2022)   [place]    Feeling of Stress : Not at all  Social Connections: Not on file    Vital Signs: BP (!) 122/59   Pulse 66   Temp 98.3 F (36.8 C) (Oral)   Resp 16   Ht '5\' 10"'$  (1.778 m)   Wt 191 lb 2.2 oz (86.7 kg)   SpO2 98%   BMI 27.43 kg/m   Imaging: DG Abd Portable 1V  Result Date: 10/05/2022 CLINICAL DATA:  Small-bowel obstruction EXAM: PORTABLE ABDOMEN - 1 VIEW COMPARISON:  Radiograph 10/03/2022, CT 10/03/2019 FINDINGS: Persistent small bowel dilation in the lower abdomen. Contrast material retained within the colon, unchanged. IMPRESSION: Persistent small bowel dilation concerning for small bowel obstruction. Electronically Signed   By: Maurine Simmering M.D.   On: 10/05/2022 10:58   DG Abd 1 View  Result Date: 10/03/2022 CLINICAL DATA:  Small bowel obstruction. EXAM: ABDOMEN - 1 VIEW COMPARISON:  CT scan of the abdomen and pelvis October 02, 2022 FINDINGS: Contrast identified throughout the length of the colon. Continued small bowel obstruction with dilated small bowel loops. This film is not significantly  different compared to yesterday scout film. Lung bases are normal. No free air, portal venous gas, or pneumatosis identified. IMPRESSION: Continued small bowel obstruction as above. Electronically Signed   By: Dorise Bullion III M.D.   On: 10/03/2022 11:55   CT ABDOMEN PELVIS W CONTRAST  Result Date: 10/02/2022 CLINICAL DATA:  Acute generalized abdominal pain, vomiting. EXAM: CT ABDOMEN AND PELVIS WITH CONTRAST TECHNIQUE: Multidetector CT imaging of the abdomen and pelvis was performed using the standard protocol following bolus administration of intravenous contrast. RADIATION DOSE REDUCTION: This exam was performed according to the departmental dose-optimization program which includes automated exposure control, adjustment of the mA and/or kV according to patient  size and/or use of iterative reconstruction technique. CONTRAST:  141m OMNIPAQUE IOHEXOL 300 MG/ML  SOLN COMPARISON:  September 25, 2022. FINDINGS: Lower chest: No acute abnormality. Hepatobiliary: No focal liver abnormality is seen. No gallstones, gallbladder wall thickening, or biliary dilatation. Pancreas: Unremarkable. No pancreatic ductal dilatation or surrounding inflammatory changes. Spleen: Normal in size without focal abnormality. Adrenals/Urinary Tract: Adrenal glands appear normal. Stable right renal cysts are noted for which no further follow-up is required. No hydronephrosis or renal obstruction is noted. Urinary bladder is unremarkable. Stomach/Bowel: The stomach is unremarkable. Colostomy is noted in left lower quadrant. There is interval development of significant small bowel dilatation, with transition zone seen in the pelvis best seen on image number 35 of series 5. This may be due to adhesion or scarring. Vascular/Lymphatic: Aortic atherosclerosis. No enlarged abdominal or pelvic lymph nodes. Reproductive: Prostate is unremarkable. Bilateral hydroceles are again noted. Other: Status post surgical resection of rectum. There is  again noted large soft tissue abnormality in the presacral region measuring 7.0 x 5.9 cm. There is continued presence air and fluid collection within this area measuring 3.6 x 3.2 cm. This is slightly increased compared to prior exam. Musculoskeletal: No acute or significant osseous findings. IMPRESSION: Status post rectal resection with colostomy placement and left lower quadrant. There is interval development of significant small bowel dilatation, with transition zone seen in the pelvis. This is highly concerning for obstruction secondary to adhesion or scarring. Continued presence of large soft tissue abnormality in the presacral region measuring 7.0 x 5.9 cm. It is uncertain if this represents postoperative scarring or inflammation or possibly neoplasm. 3.6 x 3.2 cm air and fluid collection is noted within this area which is increased compared to prior exam slightly; this is concerning for possible abscess. Bilateral hydroceles are again noted. Aortic Atherosclerosis (ICD10-I70.0). Electronically Signed   By: JMarijo ConceptionM.D.   On: 10/02/2022 11:41   CT CHEST ABDOMEN PELVIS W CONTRAST  Result Date: 09/26/2022 CLINICAL DATA:  Colorectal carcinoma. Surveillance exam. Chemotherapy and radiation therapy complete 2002. * Tracking Code: BO * EXAM: CT CHEST, ABDOMEN, AND PELVIS WITH CONTRAST TECHNIQUE: Multidetector CT imaging of the chest, abdomen and pelvis was performed following the standard protocol during bolus administration of intravenous contrast. RADIATION DOSE REDUCTION: This exam was performed according to the departmental dose-optimization program which includes automated exposure control, adjustment of the mA and/or kV according to patient size and/or use of iterative reconstruction technique. CONTRAST:  1013mISOVUE-300 IOPAMIDOL (ISOVUE-300) INJECTION 61% COMPARISON:  CT 09/17/2021 FINDINGS: CT CHEST FINDINGS Cardiovascular: No significant vascular findings. Normal heart size. No pericardial  effusion. Port in the anterior chest wall with tip in distal SVC. Mediastinum/Nodes: LEFT lateral to the proximal esophagus at the level of the thyroid gland there is a pouch which contains food stuff measuring 20 mm by 21 mm (5/2. This is similar to comparison CT from 2021. 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 No mediastinal adenopathy.  No axillary supraclavicular adenopathy. Lungs/Pleura: Mild basilar atelectasis. No suspicious pulmonary nodules. Musculoskeletal: No aggressive osseous lesion. CT ABDOMEN AND PELVIS FINDINGS Hepatobiliary: No focal hepatic lesion. No biliary ductal dilatation. Gallbladder is normal. Common bile duct is normal. Pancreas: Pancreas is normal. No ductal dilatation. No pancreatic inflammation. Spleen: Normal spleen Adrenals/urinary tract: Adrenal glands and kidneys are normal. The ureters and bladder normal. Stomach/Bowel: Stomach, duodenum small-bowel normal. Terminal ileum normal. Moderate volume stool in the ascending colon. Large volume stool in the transverse colon. The descending colon exits  a LEFT abdominal wall ostomy without obstruction. Prior resection of the rectum. Interval removal of drainage catheter from the presacral gas and fluid collection. The gas and fluid collection in the presacral space measures 3.1 by 2.6 cm compared to 3.3 x 3.6 cm. There is increased gas within the collection. Thin enhancing rim. Overall collections very similar. There is presacral on thickening of the soft tissues presacral space. No pelvic adenopathy Vascular/Lymphatic: Abdominal aorta is normal caliber. There is no retroperitoneal or periportal lymphadenopathy. No pelvic lymphadenopathy. Reproductive: Large bilateral hydroceles unchanged from prior. The larger LEFT hydrocele measures up to 10 cm. Other: No free fluid. Musculoskeletal: No aggressive osseous lesion. IMPRESSION: Chest Impression: 1. No evidence of thoracic metastasis. 2. 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 Abdomen / Pelvis Impression: 1. No evidence of colorectal carcinoma recurrence or metastasis in the abdomen pelvis. 2. LEFT colostomy without complication. 3. Presacral well-circumscribed gas and fluid collection not changed from comparison CT 09/17/2021. 4. Large bilateral scrotal hydroceles. Electronically Signed   By: Suzy Bouchard M.D.   On: 09/26/2022 14:19    Labs:  CBC: Recent Labs    01/20/22 1030 10/02/22 0943 10/03/22 0323  WBC 3.7* 9.4 5.8  HGB 13.4 14.8 11.8*  HCT 40.6 42.0 34.0*  PLT 241.0 272 203    BMP: Recent Labs    10/28/21 1002 01/20/22 1030 10/02/22 0943 10/03/22 0323  NA 140 138 139 138  K 3.4* 4.3 3.9 3.9  CL 105 104 98 106  CO2 '27 29 27 24  '$ GLUCOSE 97 87 141* 87  BUN '15 15 23 17  '$ CALCIUM 9.2 9.6 10.8* 9.4  CREATININE 0.84 0.86 1.12 1.16  GFRNONAA >60  --  >60 >60    LIVER FUNCTION TESTS: Recent Labs    01/20/22 1030 10/02/22 0943  BILITOT 0.7 1.0  AST 12 18  ALT 9 18  ALKPHOS 126* 109  PROT 6.9 8.5*  ALBUMIN 4.1 5.0    TUMOR MARKERS: Recent Labs    12/26/21 1000 04/16/22 0925  CEA 1.96 1.82    Assessment and Plan:  71 year old male s/p sigmoid colon resection and colostomy creation.  N/V brought him back to hospital and he appeared to have a pSBO.  Imaging demonstrated large soft tissue abnormality in the presacral region.  Additional review of this by interventionalist, Michaelle Birks, MD suggests this is likely adipose tissue and it is unlikely there is any abscess formation.  This is further supported by lack of fever and no leukocytosis.  It appears his N/V has improved with the resuming bowel function.    Can consider reimaging in another 1-2 days for reassurance and evaluating for any new development.    IR has no intervention to offer at this time, but remains available for re-consult as needed.  Thank you for this interesting consult.  I greatly enjoyed meeting  Shane Arnone. and look forward to participating in their care.  A copy of this report was sent to the requesting provider on this date.  Electronically Signed: Pasty Spillers, PA 10/05/2022, 4:53 PM   I spent a total of 20 Minutes in clinical consultation, greater than 50% of which was counseling/coordinating care for abnormal imaging post-operatively.

## 2022-10-05 NOTE — Progress Notes (Signed)
Initial Nutrition Assessment  DOCUMENTATION CODES:   Not applicable  INTERVENTION:  Monitor for diet advancement Boost Breeze po TID, each supplement provides 250 kcal and 9 grams of protein 30 ml ProSource Plus TID, each supplement provides 100 kcals and 15 grams protein.   NUTRITION DIAGNOSIS:   Increased nutrient needs related to acute illness, chronic illness as evidenced by estimated needs.  GOAL:   Patient will meet greater than or equal to 90% of their needs  MONITOR:   PO intake, Supplement acceptance, Diet advancement, Labs, Weight trends  REASON FOR ASSESSMENT:   Malnutrition Screening Tool    ASSESSMENT:   Pt admitted with abdominal pain, nausea and vomiting secondary to SBO. PMH significant rectal cancer s/p resection and colostomy.   Pt states that his last normal meal was Thanksgiving. That evening he began to feel ill and experienced episodes of emesis. Up until that point he had been eating at his baseline within the last 8 months. He was admitted in 2021 for rectal cancer and states that his appetite and PO intake had decreased significantly. His appetite had slowly started to improve and he has been consistent within the last 8 months. He recalls eating breakfast, a small snack and dinner. When he does not eat as much at a meal, he will supplement with Premier Protein. He has tried Ensure but does not like these supplements as he does not typically enjoy milk.   He reports going to the gym and weight lifting 5 days per week, however within the last month has not gone as much.   Last night he ate his first regular meal which he reports was pot roast. A little while after this meal he experienced abdominal pain and emesis. Surgery ordered repeat xray and downgraded diet to full liquids with plans for slower advancement.    Pt reports that his weight had gotten down below 175 lbs within the last 2 years d/t rectal cancer. He prefers to weigh about 190 lbs and  states that his weight has remained stable around this weight.   Reviewed weight history. Within the last year, his weight has increased from 76.8 kg to 86.7 kg this admission.   Medications and labs: reviewed and include IV abx  NUTRITION - FOCUSED PHYSICAL EXAM:  Flowsheet Row Most Recent Value  Orbital Region No depletion  Upper Arm Region No depletion  Thoracic and Lumbar Region No depletion  Buccal Region No depletion  Temple Region No depletion  Clavicle Bone Region Moderate depletion  Clavicle and Acromion Bone Region Mild depletion  Scapular Bone Region No depletion  Dorsal Hand No depletion  Patellar Region Mild depletion  Anterior Thigh Region Mild depletion  Posterior Calf Region No depletion  Edema (RD Assessment) None  Hair Reviewed  Eyes Reviewed  Mouth Reviewed  Skin Reviewed  Nails Reviewed       Diet Order:   Diet Order             Diet full liquid Room service appropriate? Yes; Fluid consistency: Thin  Diet effective now                   EDUCATION NEEDS:   Education needs have been addressed  Skin:  Skin Assessment: Reviewed RN Assessment  Last BM:  11/26 via colostomy  Height:   Ht Readings from Last 1 Encounters:  10/02/22 '5\' 10"'$  (1.778 m)    Weight:   Wt Readings from Last 1 Encounters:  10/02/22 86.7 kg  BMI:  Body mass index is 27.43 kg/m.  Estimated Nutritional Needs:   Kcal:  2200-2400  Protein:  110-125g  Fluid:  >/=2L  Clayborne Dana, RDN, LDN Clinical Nutrition

## 2022-10-05 NOTE — Progress Notes (Signed)
Progress Note     Subjective: Advanced to regular diet last night and ate a full meal. Had increasing abdominal pain afterwards and episode of emesis. Since then pain has improved but remains intermittent. Still having bowel function - gas and stool from colostomy   Objective: Vital signs in last 24 hours: Temp:  [98.3 F (36.8 C)-98.4 F (36.9 C)] 98.3 F (36.8 C) (11/27 0822) Pulse Rate:  [65-68] 65 (11/27 0822) BP: (117-156)/(54-72) 117/54 (11/27 0822) SpO2:  [100 %] 100 % (11/27 0822) Last BM Date : 10/04/22  Intake/Output from previous day: No intake/output data recorded. Intake/Output this shift: No intake/output data recorded.  PE: General: pleasant, WD, male who is laying in bed in NAD Heart: regular, rate, and rhythm Lungs: Respiratory effort nonlabored Abd: soft, NT, ND, colostomy with gas and soft stool MSK: all 4 extremities are symmetrical with no cyanosis, clubbing, or edema. Skin: warm and dry Psych: A&Ox3 with an appropriate affect.    Lab Results:  Recent Labs    10/02/22 0943 10/03/22 0323  WBC 9.4 5.8  HGB 14.8 11.8*  HCT 42.0 34.0*  PLT 272 203   BMET Recent Labs    10/02/22 0943 10/03/22 0323  NA 139 138  K 3.9 3.9  CL 98 106  CO2 27 24  GLUCOSE 141* 87  BUN 23 17  CREATININE 1.12 1.16  CALCIUM 10.8* 9.4   PT/INR No results for input(s): "LABPROT", "INR" in the last 72 hours. CMP     Component Value Date/Time   NA 138 10/03/2022 0323   K 3.9 10/03/2022 0323   CL 106 10/03/2022 0323   CO2 24 10/03/2022 0323   GLUCOSE 87 10/03/2022 0323   BUN 17 10/03/2022 0323   CREATININE 1.16 10/03/2022 0323   CREATININE 0.88 01/10/2021 0934   CREATININE 0.97 06/24/2020 1417   CALCIUM 9.4 10/03/2022 0323   PROT 8.5 (H) 10/02/2022 0943   ALBUMIN 5.0 10/02/2022 0943   AST 18 10/02/2022 0943   AST 21 01/10/2021 0934   ALT 18 10/02/2022 0943   ALT 12 01/10/2021 0934   ALKPHOS 109 10/02/2022 0943   BILITOT 1.0 10/02/2022 0943    BILITOT 0.7 01/10/2021 0934   GFRNONAA >60 10/03/2022 0323   GFRNONAA >60 01/10/2021 0934   GFRAA >60 07/15/2020 0048   GFRAA >60 07/04/2020 0845   Lipase     Component Value Date/Time   LIPASE 17 10/02/2022 0943       Studies/Results: DG Abd 1 View  Result Date: 10/03/2022 CLINICAL DATA:  Small bowel obstruction. EXAM: ABDOMEN - 1 VIEW COMPARISON:  CT scan of the abdomen and pelvis October 02, 2022 FINDINGS: Contrast identified throughout the length of the colon. Continued small bowel obstruction with dilated small bowel loops. This film is not significantly different compared to yesterday scout film. Lung bases are normal. No free air, portal venous gas, or pneumatosis identified. IMPRESSION: Continued small bowel obstruction as above. Electronically Signed   By: Dorise Bullion III M.D.   On: 10/03/2022 11:55    Anti-infectives: Anti-infectives (From admission, onward)    Start     Dose/Rate Route Frequency Ordered Stop   10/04/22 1200  piperacillin-tazobactam (ZOSYN) IVPB 3.375 g        3.375 g 12.5 mL/hr over 240 Minutes Intravenous Every 8 hours 10/04/22 0710     10/02/22 1915  piperacillin-tazobactam (ZOSYN) IVPB 3.375 g  Status:  Discontinued        3.375 g 100 mL/hr over 30  Minutes Intravenous Every 6 hours 10/02/22 1822 10/04/22 0710   10/02/22 1115  piperacillin-tazobactam (ZOSYN) IVPB 3.375 g        3.375 g 100 mL/hr over 30 Minutes Intravenous  Once 10/02/22 1108 10/02/22 1224        Assessment/Plan  SBO hx of distal sigmoid cancer, 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 Think he is still overall resolving but ate too much too quickly. As he is feeling better this morning with +BF will repeat xray but likely okay to resume FLD with slower advancement  FEN: NPO pending xray ID: [id number] VTE:  lovenox  I reviewed last 24 h vitals and pain scores, last 48 h intake and output, last 24 h labs and trends, and last 24 h imaging results.    LOS: 3 days   [facility] 10/05/2022, 9:22 AM Please see Amion for pager number during day hours 7:00am-4:30pm

## 2022-10-05 NOTE — Progress Notes (Signed)
[facility] Progress Note  Patient: Shane [name].     ZOX:[id number]  DOA: 10/02/2022   PCP: Marrian Salvage, FNP       Brief hospital course: This is a 71 year old male with a history of rectal cancer status post resection and colostomy who presents to the hospital with nausea vomiting and abdominal pain that started after Thanksgiving dinner. In the ED the patient had imaging that revealed a small bowel obstruction.  He was placed on IV fluids and admitted for further management.  General surgery evaluated him and recommended to continue conservative treatment.  Subjective:  Had abdominal pain last night when trying to eat solid food. Still having stool in colostomy. No vomiting.  Assessment and Plan: Principal Problem:   SBO (small bowel obstruction) ([place]) -The patient has a significant amount of stool in his colostomy and therefore obstruction may be resolving - downgraded to full liquids today by general surgery  Active Problems:   Intra-abdominal fluid collection -No fever or leukocytosis to suggest infection    Rectal carcinoma ([place]) -Status post resection and colostomy     Code Status: Full Code Consultants: General surgery Level of Care: Level of care: Telemetry Medical Total time on patient care: 35 minutes DVT prophylaxis:  enoxaparin (LOVENOX) injection 40 mg Start: 10/02/22 2200     Objective:   Vitals:   10/04/22 0350 10/04/22 0716 10/04/22 2104 10/05/22 0822  BP: (!) 129/55 (!) 137/57 (!) 156/72 (!) 117/54  Pulse: 63 71 68 65  Resp: 17 16    Temp: 98 F (36.7 C) 98.1 F (36.7 C) 98.4 F (36.9 C) 98.3 F (36.8 C)  TempSrc: Oral Oral Oral Oral  SpO2: 100%  100% 100%  Weight:      Height:       Filed Weights   10/02/22 0923  Weight: 86.7 kg   Exam: General exam: Appears comfortable  HEENT: oral mucosa moist Respiratory system: Clear to auscultation.  Cardiovascular system: S1 & S2 heard  Gastrointestinal system:  Abdomen soft, non-tender, nondistended. Normal bowel sounds  - stool present in colostomy Extremities: No cyanosis, clubbing or edema Psychiatry:  Mood & affect appropriate.       CBC: Recent Labs  Lab 10/02/22 0943 10/03/22 0323  WBC 9.4 5.8  NEUTROABS 8.5*  --   HGB 14.8 11.8*  HCT 42.0 34.0*  MCV 87.1 87.9  PLT 272 811    Basic Metabolic Panel: Recent Labs  Lab 10/02/22 0943 10/03/22 0323  NA 139 138  K 3.9 3.9  CL 98 106  CO2 27 24  GLUCOSE 141* 87  BUN 23 17  CREATININE 1.12 1.16  CALCIUM 10.8* 9.4    GFR: Estimated Creatinine Clearance: 59.4 mL/min (by C-G formula based on SCr of 1.16 mg/dL).  Scheduled Meds:  Chlorhexidine Gluconate Cloth  6 each Topical Daily   enoxaparin (LOVENOX) injection  40 mg Subcutaneous Q24H   Continuous Infusions:  piperacillin-tazobactam (ZOSYN)  IV 3.375 g (10/05/22 0550)   Imaging and lab data was personally reviewed DG Abd Portable 1V  Result Date: 10/05/2022 CLINICAL DATA:  Small-bowel obstruction EXAM: PORTABLE ABDOMEN - 1 VIEW COMPARISON:  Radiograph 10/03/2022, CT 10/03/2019 FINDINGS: Persistent small bowel dilation in the lower abdomen. Contrast material retained within the colon, unchanged. IMPRESSION: Persistent small bowel dilation concerning for small bowel obstruction. Electronically Signed   By: Maurine Simmering M.D.   On: 10/05/2022 10:58    LOS: 3 days   Author: Eunice Blase Chakia Counts  10/05/2022 1:06  PM  To contact [facility]>   Check the care team in [facility] and look for the attending/consulting [facility] provider listed  Log into [email or web address] and use [place]'s universal password   Go to> "[facility]"  and find provider  If you still have difficulty reaching the provider, please page the [facility] (Director on Call) for the Hospitalists listed on amion

## 2022-10-06 ENCOUNTER — Encounter: Payer: Self-pay | Admitting: Oncology

## 2022-10-06 DIAGNOSIS — K56609 Unspecified intestinal obstruction, unspecified as to partial versus complete obstruction: Secondary | ICD-10-CM | POA: Diagnosis not present

## 2022-10-06 NOTE — Progress Notes (Signed)
[facility] Progress Note  Patient: Shane [name].     TOI:[id number]  DOA: 10/02/2022   PCP: Marrian Salvage, FNP       Brief hospital course: This is a 71 year old male with a history of rectal cancer status post resection and colostomy who presents to the hospital with nausea vomiting and abdominal pain that started after Thanksgiving dinner. In the ED the patient had imaging that revealed a small bowel obstruction.  He was placed on IV fluids and admitted for further management.  General surgery evaluated him and recommended to continue conservative treatment.  Subjective:  No new complaints. Tolerating full liquid diet. Has air in colostomy but not much stool.   Assessment and Plan: Principal Problem:   SBO (small bowel obstruction) ([facility]) - obstruction appeared to be resolving but he had increasing abdominal pain after advancing to solids - downgraded to full liquids on 11/27 by general surgery- abd xray revealed persistent SBO - advanced back to solid food today- will need to monitor 24 hrs to ensure obstruction has resolved  Active Problems:   Intra-abdominal fluid collection -No fever or leukocytosis to suggest infection    Rectal carcinoma ([facility]) -Status post resection and colostomy     Code Status: Full Code Consultants: General surgery Level of Care: Level of care: Telemetry Medical Total time on patient care: 30 minutes DVT prophylaxis:  enoxaparin (LOVENOX) injection 40 mg Start: 10/02/22 2200     Objective:   Vitals:   10/05/22 0822 10/05/22 1528 10/05/22 2058 10/06/22 0741  BP: (!) 117/54 (!) 122/59 (!) 136/54 135/70  Pulse: 65 66 66 (!) 58  Resp:   15 18  Temp: 98.3 F (36.8 C) 98.3 F (36.8 C) 98.3 F (36.8 C) 98.1 F (36.7 C)  TempSrc: Oral Oral  Oral  SpO2: 100% 98% 97% 99%  Weight:      Height:       Filed Weights   10/02/22 0923  Weight: 86.7 kg   Exam: General exam: Appears comfortable  HEENT: oral mucosa  moist Respiratory system: Clear to auscultation.  Cardiovascular system: S1 & S2 heard  Gastrointestinal system: Abdomen soft, non-tender, nondistended. Normal bowel sounds  - colostomy noted Extremities: No cyanosis, clubbing or edema Psychiatry:  Mood & affect appropriate.       CBC: Recent Labs  Lab 10/02/22 0943 10/03/22 0323  WBC 9.4 5.8  NEUTROABS 8.5*  --   HGB 14.8 11.8*  HCT 42.0 34.0*  MCV 87.1 87.9  PLT 272 833    Basic Metabolic Panel: Recent Labs  Lab 10/02/22 0943 10/03/22 0323  NA 139 138  K 3.9 3.9  CL 98 106  CO2 27 24  GLUCOSE 141* 87  BUN 23 17  CREATININE 1.12 1.16  CALCIUM 10.8* 9.4    GFR: Estimated Creatinine Clearance: 59.4 mL/min (by C-G formula based on SCr of 1.16 mg/dL).  Scheduled Meds:  (feeding supplement) PROSource Plus  30 mL Oral TID BM   Chlorhexidine Gluconate Cloth  6 each Topical Daily   enoxaparin (LOVENOX) injection  40 mg Subcutaneous Q24H   feeding supplement  1 Container Oral TID BM   Continuous Infusions:  piperacillin-tazobactam (ZOSYN)  IV 3.375 g (10/06/22 8250)   Imaging and lab data was personally reviewed DG Abd Portable 1V  Result Date: 10/05/2022 CLINICAL DATA:  Small-bowel obstruction EXAM: PORTABLE ABDOMEN - 1 VIEW COMPARISON:  Radiograph 10/03/2022, CT 10/03/2019 FINDINGS: Persistent small bowel dilation in the lower abdomen. Contrast material retained within  the colon, unchanged. IMPRESSION: Persistent small bowel dilation concerning for small bowel obstruction. Electronically Signed   By: Maurine Simmering M.D.   On: 10/05/2022 10:58    LOS: 4 days   Author: Debbe Odea  10/06/2022 12:34 PM  To contact [facility]>   Check the care team in [facility] and look for the attending/consulting [place] provider listed  Log into [email or web address] and use [place]'s universal password   Go to> "[facility]"  and find provider  If you still have difficulty reaching the provider, please page the [facility] (Director  on Call) for the Hospitalists listed on amion

## 2022-10-06 NOTE — Progress Notes (Signed)
[facility] Progress Note     Subjective: CC-  Feeling much better today. Tolerated full liquids all day yesterday without abdominal bloating, nausea, or vomiting. Emptied stool from colostomy bag last night, air in bad this morning.  Objective: Vital signs in last 24 hours: Temp:  [98.1 F (36.7 C)-98.3 F (36.8 C)] 98.1 F (36.7 C) (11/28 0741) Pulse Rate:  [58-66] 58 (11/28 0741) Resp:  [15-18] 18 (11/28 0741) BP: (122-136)/(54-70) 135/70 (11/28 0741) SpO2:  [97 %-99 %] 99 % (11/28 0741) Last BM Date : 10/04/22  Intake/Output from previous day: 11/27 0701 - 11/28 0700 In: 200 [IV Piggyback:200] Out: 450 [Urine:450] Intake/Output this shift: No intake/output data recorded.  PE: General: pleasant, WD, male who is laying in bed in NAD Lungs: Respiratory effort nonlabored Abd: soft, NT, ND, colostomy with gas   Lab Results:  No results for input(s): "WBC", "HGB", "HCT", "PLT" in the last 72 hours. BMET No results for input(s): "NA", "K", "CL", "CO2", "GLUCOSE", "BUN", "CREATININE", "CALCIUM" in the last 72 hours. PT/INR No results for input(s): "LABPROT", "INR" in the last 72 hours. CMP     Component Value Date/Time   NA 138 10/03/2022 0323   K 3.9 10/03/2022 0323   CL 106 10/03/2022 0323   CO2 24 10/03/2022 0323   GLUCOSE 87 10/03/2022 0323   BUN 17 10/03/2022 0323   CREATININE 1.16 10/03/2022 0323   CREATININE 0.88 01/10/2021 0934   CREATININE 0.97 06/24/2020 1417   CALCIUM 9.4 10/03/2022 0323   PROT 8.5 (H) 10/02/2022 0943   ALBUMIN 5.0 10/02/2022 0943   AST 18 10/02/2022 0943   AST 21 01/10/2021 0934   ALT 18 10/02/2022 0943   ALT 12 01/10/2021 0934   ALKPHOS 109 10/02/2022 0943   BILITOT 1.0 10/02/2022 0943   BILITOT 0.7 01/10/2021 0934   GFRNONAA >60 10/03/2022 0323   GFRNONAA >60 01/10/2021 0934   GFRAA >60 07/15/2020 0048   GFRAA >60 07/04/2020 0845   Lipase     Component Value Date/Time   LIPASE 17 10/02/2022 0943        Studies/Results: DG Abd Portable 1V  Result Date: 10/05/2022 CLINICAL DATA:  Small-bowel obstruction EXAM: PORTABLE ABDOMEN - 1 VIEW COMPARISON:  Radiograph 10/03/2022, CT 10/03/2019 FINDINGS: Persistent small bowel dilation in the lower abdomen. Contrast material retained within the colon, unchanged. IMPRESSION: Persistent small bowel dilation concerning for small bowel obstruction. Electronically Signed   By: Maurine Simmering M.D.   On: 10/05/2022 10:58    Anti-infectives: Anti-infectives (From admission, onward)    Start     Dose/Rate Route Frequency Ordered Stop   10/04/22 1200  piperacillin-tazobactam (ZOSYN) IVPB 3.375 g        3.375 g 12.5 mL/hr over 240 Minutes Intravenous Every 8 hours 10/04/22 0710     10/02/22 1915  piperacillin-tazobactam (ZOSYN) IVPB 3.375 g  Status:  Discontinued        3.375 g 100 mL/hr over 30 Minutes Intravenous Every 6 hours 10/02/22 1822 10/04/22 0710   10/02/22 1115  piperacillin-tazobactam (ZOSYN) IVPB 3.375 g        3.375 g 100 mL/hr over 30 Minutes Intravenous  Once 10/02/22 1108 10/02/22 1224        Assessment/Plan SBO hx of distal sigmoid cancer, 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 Advance to regular diet today.   FEN:  reg diet ID: [email or web address] VTE: lovenox   I reviewed last 24 h vitals and pain scores, last 48 h intake and output, last 24 h labs and trends, and last 24 h imaging results.    LOS: 4 days    [facility] 10/06/2022, 9:37 AM Please see Amion for pager number during day hours 7:00am-4:30pm

## 2022-10-06 NOTE — Plan of Care (Incomplete)
  Problem: Activity: Goal: Risk for activity intolerance will decrease Outcome: Progressing   Problem: Nutrition: Goal: Adequate nutrition will be maintained Outcome: Progressing   Problem: Pain Managment: Goal: General experience of comfort will improve Outcome: Progressing   

## 2022-10-06 NOTE — Progress Notes (Signed)
Mobility Specialist Progress Note   10/06/22 1700  Mobility  Activity Ambulated independently in hallway  Level of Assistance Independent after set-up  Assistive Device None  Distance Ambulated (ft) 550 ft  Activity Response Tolerated well  $Mobility charge 1 Mobility   Received pt in doorway agreeable to hall way ambulation. No faults during walk, returned back to room with needs met and call bell in reach.   [place] Falling Mobility Specialist Please contact via SecureChat or  Rehab office at [phone]

## 2022-10-06 NOTE — Plan of Care (Signed)

## 2022-10-07 DIAGNOSIS — K56609 Unspecified intestinal obstruction, unspecified as to partial versus complete obstruction: Secondary | ICD-10-CM | POA: Diagnosis not present

## 2022-10-07 LAB — CBC
HCT: 35.1 % — ABNORMAL LOW (ref 39.0–52.0)
Hemoglobin: 12.5 g/dL — ABNORMAL LOW (ref 13.0–17.0)
MCH: 31 pg (ref 26.0–34.0)
MCHC: 35.6 g/dL (ref 30.0–36.0)
MCV: 87.1 fL (ref 80.0–100.0)
Platelets: 198 10*3/uL (ref 150–400)
RBC: 4.03 MIL/uL — ABNORMAL LOW (ref 4.22–5.81)
RDW: 11.8 % (ref 11.5–15.5)
WBC: 4.3 10*3/uL (ref 4.0–10.5)
nRBC: 0 % (ref 0.0–0.2)

## 2022-10-07 LAB — BASIC METABOLIC PANEL
Anion gap: 6 (ref 5–15)
BUN: 13 mg/dL (ref 8–23)
CO2: 25 mmol/L (ref 22–32)
Calcium: 9 mg/dL (ref 8.9–10.3)
Chloride: 105 mmol/L (ref 98–111)
Creatinine, Ser: 1 mg/dL (ref 0.61–1.24)
GFR, Estimated: >60 mL/min (ref >60)
Glucose, Bld: 91 mg/dL (ref 70–99)
Potassium: 3.7 mmol/L (ref 3.5–5.1)
Sodium: 136 mmol/L (ref 135–145)

## 2022-10-07 MED ORDER — HEPARIN SOD (PORK) LOCK FLUSH 100 UNIT/ML IV SOLN: 500.0000 [IU] | INTRAVENOUS | Status: DC | PRN

## 2022-10-07 NOTE — Discharge Summary (Signed)
Physician Discharge Summary   Patient: Shane [name]. MRN: [id number] DOB: 04-01-1951  Admit date:     10/02/2022  Discharge date: 10/07/22  Discharge Physician: Elmarie Shiley   PCP: Marrian Salvage, FNP   Recommendations at discharge:    Needs follow up with PCP  Needs to follow up with Dr Benay Spice.   Discharge Diagnoses: Principal Problem:   SBO (small bowel obstruction) (HCC) Active Problems:   Intra-abdominal fluid collection   Rectal carcinoma (HCC)  Resolved Problems:   * No resolved hospital problems. [facility] Hospital Course: This is a 71 year old male with a history of rectal cancer status post resection and colostomy who presents to the hospital with nausea vomiting and abdominal pain that started after Thanksgiving dinner. In the ED the patient had imaging that revealed a small bowel obstruction.  He was placed on IV fluids and admitted for further management.  General surgery evaluated him and recommended to continue conservative treatment. Patient responded to conservative management. He has been tolerating diet. Ok to discharge home per general surgery.    Assessment and Plan:  SBO (small bowel obstruction) (HCC) -Obstruction appeared to be resolving but he had increasing abdominal pain after advancing to solids -Downgraded to full liquids on 11/27 by general surgery- abd xray revealed persistent SBO - Advanced back to solid food 11/28.   -He is having out put from colostomy, he denies pain, he has been tolerating diet.  -Ok to discharge per surgery.   Active Problems:   Intra-abdominal fluid collection, Has known chronic presacral fluid collection  -No fever or leukocytosis to suggest infection No need to discharge on antibiotics per Sx.     Rectal carcinoma ([place]) -Status post resection and colostomy        Consultants: General Sx Procedures performed: None Disposition: Home Diet recommendation:  Discharge Diet Orders (From admission,  onward)     Start     Ordered   10/07/22 0000  Diet - low sodium heart healthy        10/07/22 1243           Regular diet DISCHARGE MEDICATION: Allergies as of 10/07/2022   No Known Allergies      Medication List     STOP taking these medications    naproxen sodium 220 MG tablet Commonly known as: ALEVE       TAKE these medications    diclofenac 75 MG EC tablet Commonly known as: VOLTAREN Take 75 mg by mouth 3 times/day as needed-between meals & bedtime (pain).   furosemide 20 MG tablet Commonly known as: LASIX Take 1 tablet (20 mg total) by mouth daily as needed for fluid. What changed: when to take this   pravastatin 20 MG tablet Commonly known as: PRAVACHOL TAKE 1 TABLET BY MOUTH EVERY DAY What changed: when to take this   valsartan 80 MG tablet Commonly known as: DIOVAN Take 1 tablet (80 mg total) by mouth daily. What changed: when to take this        [facility] Call.   Specialty: General Surgery Why: As needed, If symptoms worsen Contact information: [address] [place], [place], [place] Follow up.   Specialty: Internal Medicine Contact information: [place] [address] [phone]                Discharge  Exam: Filed Weights   10/02/22 0923  Weight: 86.7 kg   General; NAD  Condition at discharge: stable  The results of significant diagnostics from this hospitalization (including imaging, microbiology, ancillary and laboratory) are listed below for reference.   Imaging Studies: DG Abd Portable 1V  Result Date: 10/05/2022 CLINICAL DATA:  Small-bowel obstruction EXAM: PORTABLE ABDOMEN - 1 VIEW COMPARISON:  Radiograph 10/03/2022, CT 10/03/2019 FINDINGS: Persistent small bowel dilation in the lower abdomen. Contrast material retained within the colon, unchanged. IMPRESSION:  Persistent small bowel dilation concerning for small bowel obstruction. Electronically Signed   By: Maurine Simmering M.D.   On: 10/05/2022 10:58   DG Abd 1 View  Result Date: 10/03/2022 CLINICAL DATA:  Small bowel obstruction. EXAM: ABDOMEN - 1 VIEW COMPARISON:  CT scan of the abdomen and pelvis October 02, 2022 FINDINGS: Contrast identified throughout the length of the colon. Continued small bowel obstruction with dilated small bowel loops. This film is not significantly different compared to yesterday scout film. Lung bases are normal. No free air, portal venous gas, or pneumatosis identified. IMPRESSION: Continued small bowel obstruction as above. Electronically Signed   By: Dorise Bullion III M.D.   On: 10/03/2022 11:55   CT ABDOMEN PELVIS W CONTRAST  Result Date: 10/02/2022 CLINICAL DATA:  Acute generalized abdominal pain, vomiting. EXAM: CT ABDOMEN AND PELVIS WITH CONTRAST TECHNIQUE: Multidetector CT imaging of the abdomen and pelvis was performed using the standard protocol following bolus administration of intravenous contrast. RADIATION DOSE REDUCTION: This exam was performed according to the departmental dose-optimization program which includes automated exposure control, adjustment of the mA and/or kV according to patient size and/or use of iterative reconstruction technique. CONTRAST:  169m OMNIPAQUE IOHEXOL 300 MG/ML  SOLN COMPARISON:  September 25, 2022. FINDINGS: Lower chest: No acute abnormality. Hepatobiliary: No focal liver abnormality is seen. No gallstones, gallbladder wall thickening, or biliary dilatation. Pancreas: Unremarkable. No pancreatic ductal dilatation or surrounding inflammatory changes. Spleen: Normal in size without focal abnormality. Adrenals/Urinary Tract: Adrenal glands appear normal. Stable right renal cysts are noted for which no further follow-up is required. No hydronephrosis or renal obstruction is noted. Urinary bladder is unremarkable. Stomach/Bowel: The stomach is  unremarkable. Colostomy is noted in left lower quadrant. There is interval development of significant small bowel dilatation, with transition zone seen in the pelvis best seen on image number 35 of series 5. This may be due to adhesion or scarring. Vascular/Lymphatic: Aortic atherosclerosis. No enlarged abdominal or pelvic lymph nodes. Reproductive: Prostate is unremarkable. Bilateral hydroceles are again noted. Other: Status post surgical resection of rectum. There is again noted large soft tissue abnormality in the presacral region measuring 7.0 x 5.9 cm. There is continued presence air and fluid collection within this area measuring 3.6 x 3.2 cm. This is slightly increased compared to prior exam. Musculoskeletal: No acute or significant osseous findings. IMPRESSION: Status post rectal resection with colostomy placement and left lower quadrant. There is interval development of significant small bowel dilatation, with transition zone seen in the pelvis. This is highly concerning for obstruction secondary to adhesion or scarring. Continued presence of large soft tissue abnormality in the presacral region measuring 7.0 x 5.9 cm. It is uncertain if this represents postoperative scarring or inflammation or possibly neoplasm. 3.6 x 3.2 cm air and fluid collection is noted within this area which is increased compared to prior exam slightly; this is concerning for possible abscess. Bilateral hydroceles are again noted. Aortic Atherosclerosis (ICD10-I70.0). Electronically Signed   By: JJeneen Rinks  Murlean Caller M.D.   On: 10/02/2022 11:41   CT CHEST ABDOMEN PELVIS W CONTRAST  Result Date: 09/26/2022 CLINICAL DATA:  Colorectal carcinoma. Surveillance exam. Chemotherapy and radiation therapy complete 2002. * Tracking Code: BO * EXAM: CT CHEST, ABDOMEN, AND PELVIS WITH CONTRAST TECHNIQUE: Multidetector CT imaging of the chest, abdomen and pelvis was performed following the standard protocol during bolus administration of  intravenous contrast. RADIATION DOSE REDUCTION: This exam was performed according to the departmental dose-optimization program which includes automated exposure control, adjustment of the mA and/or kV according to patient size and/or use of iterative reconstruction technique. CONTRAST:  169m ISOVUE-300 IOPAMIDOL (ISOVUE-300) INJECTION 61% COMPARISON:  CT 09/17/2021 FINDINGS: CT CHEST FINDINGS Cardiovascular: No significant vascular findings. Normal heart size. No pericardial effusion. Port in the anterior chest wall with tip in distal SVC. Mediastinum/Nodes: LEFT lateral to the proximal esophagus at the level of the thyroid gland there is a pouch which contains food stuff measuring 20 mm by 21 mm (5/2. This is similar to comparison CT from 2021. 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 No mediastinal adenopathy.  No axillary supraclavicular adenopathy. Lungs/Pleura: Mild basilar atelectasis. No suspicious pulmonary nodules. Musculoskeletal: No aggressive osseous lesion. CT ABDOMEN AND PELVIS FINDINGS Hepatobiliary: No focal hepatic lesion. No biliary ductal dilatation. Gallbladder is normal. Common bile duct is normal. Pancreas: Pancreas is normal. No ductal dilatation. No pancreatic inflammation. Spleen: Normal spleen Adrenals/urinary tract: Adrenal glands and kidneys are normal. The ureters and bladder normal. Stomach/Bowel: Stomach, duodenum small-bowel normal. Terminal ileum normal. Moderate volume stool in the ascending colon. Large volume stool in the transverse colon. The descending colon exits a LEFT abdominal wall ostomy without obstruction. Prior resection of the rectum. Interval removal of drainage catheter from the presacral gas and fluid collection. The gas and fluid collection in the presacral space measures 3.1 by 2.6 cm compared to 3.3 x 3.6 cm. There is increased gas within the collection. Thin enhancing rim. Overall collections very similar. There is presacral on  thickening of the soft tissues presacral space. No pelvic adenopathy Vascular/Lymphatic: Abdominal aorta is normal caliber. There is no retroperitoneal or periportal lymphadenopathy. No pelvic lymphadenopathy. Reproductive: Large bilateral hydroceles unchanged from prior. The larger LEFT hydrocele measures up to 10 cm. Other: No free fluid. Musculoskeletal: No aggressive osseous lesion. IMPRESSION: Chest Impression: 1. No evidence of thoracic metastasis. 2. 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 Abdomen / Pelvis Impression: 1. No evidence of colorectal carcinoma recurrence or metastasis in the abdomen pelvis. 2. LEFT colostomy without complication. 3. Presacral well-circumscribed gas and fluid collection not changed from comparison CT 09/17/2021. 4. Large bilateral scrotal hydroceles. Electronically Signed   By: SSuzy BouchardM.D.   On: 09/26/2022 14:19    Microbiology: Results for orders placed or performed during the hospital encounter of 10/02/22  SARS Coronavirus 2 by RT PCR ([facility] order[facility] *cepheid single result test* Anterior Nasal Swab     Status: None   Collection Time: 10/02/22 12:32 PM   Specimen: Anterior Nasal Swab  Result Value Ref Range Status   SARS Coronavirus 2 by RT PCR NEGATIVE NEGATIVE Final    Comment: (NOTE) SARS-CoV-2 target nucleic acids are NOT DETECTED.  The SARS-CoV-2 RNA is generally detectable in upper and lower respiratory specimens during the acute phase of infection. The lowest concentration of SARS-CoV-2 viral copies this assay can detect is 250 copies / mL. A negative result does not preclude SARS-CoV-2  infection and should not be used as the sole basis for treatment or other patient management decisions.  A negative result may occur with improper specimen collection / handling, submission of specimen other than nasopharyngeal swab, presence of viral mutation(s) within  the areas targeted by this assay, and inadequate number of viral copies (<250 copies / mL). A negative result must be combined with clinical observations, patient history, and epidemiological information.  Fact Sheet for Patients:   https://www.patel.info/  Fact Sheet for Healthcare Providers: https://hall.com/  This test is not yet approved or  cleared by the [place] FDA and has been authorized for detection and/or diagnosis of SARS-CoV-2 by FDA under an Emergency Use Authorization (EUA).  This EUA will remain in effect (meaning this test can be used) for the duration of the COVID-19 declaration under Section 564(b)(1) of the Act, 21 U.S.C. section 360bbb-3(b)(1), unless the authorization is terminated or revoked sooner.  Performed at [facility], [address], [place], [place] [address]     Labs: CBC: Recent Labs  Lab 10/02/22 [phone] 10/03/22 0323 10/07/22 0306  WBC 9.4 5.8 4.3  NEUTROABS 8.5*  --   --   HGB 14.8 11.8* 12.5*  HCT 42.0 34.0* 35.1*  MCV 87.1 87.9 87.1  PLT 272 203 413   Basic Metabolic Panel: Recent Labs  Lab 10/02/22 0943 10/03/22 0323 10/07/22 0306  NA 139 138 136  K 3.9 3.9 3.7  CL 98 106 105  CO2 '27 24 25  '$ GLUCOSE 141* 87 91  BUN '23 17 13  '$ CREATININE 1.12 1.16 1.00  CALCIUM 10.8* 9.4 9.0   Liver Function Tests: Recent Labs  Lab 10/02/22 0943  AST 18  ALT 18  ALKPHOS 109  BILITOT 1.0  PROT 8.5*  ALBUMIN 5.0   CBG: No results for input(s): "GLUCAP" in the last 168 hours.  Discharge time spent: greater than 30 minutes.  Signed: Elmarie Shiley, MD [facility] 10/07/2022

## 2022-10-07 NOTE — Progress Notes (Signed)
Mobility Specialist Progress Note   10/07/22 1150  Mobility  Activity Ambulated independently in hallway  Level of Assistance Independent after set-up  Assistive Device None  Distance Ambulated (ft) 550 ft  Activity Response Tolerated well  $Mobility charge 1 Mobility   Received pt in bed w/o complaint and agreeable to mobility. No faults during walk, returned back to room with needs met and call bell in reach.    Holland Falling Mobility Specialist Please contact via SecureChat or  Rehab office at [phone]

## 2022-10-07 NOTE — Progress Notes (Signed)
[facility] Progress Note     Subjective: CC-  Continues to improve. Tolerated regular food yesterday without nausea/emesis/abdominal pain. Colostomy productive  Objective: Vital signs in last 24 hours: Temp:  [97.8 F (36.6 C)-98.2 F (36.8 C)] 98.2 F (36.8 C) (11/29 0836) Pulse Rate:  [58-70] 63 (11/29 0836) Resp:  [18-20] 20 (11/29 0836) BP: (120-144)/(50-70) 120/62 (11/29 0836) SpO2:  [99 %-100 %] 100 % (11/29 0836) Last BM Date : 10/06/22  Intake/Output from previous day: No intake/output data recorded. Intake/Output this shift: No intake/output data recorded.  PE: General: pleasant, WD, male who is laying in bed in NAD Lungs: Respiratory effort nonlabored Abd: soft, NT, ND, colostomy with gas and stool  Lab Results:  Recent Labs    10/07/22 0306  WBC 4.3  HGB 12.5*  HCT 35.1*  PLT 198   BMET Recent Labs    10/07/22 0306  NA 136  K 3.7  CL 105  CO2 25  GLUCOSE 91  BUN 13  CREATININE 1.00  CALCIUM 9.0   PT/INR No results for input(s): "LABPROT", "INR" in the last 72 hours. CMP     Component Value Date/Time   NA 136 10/07/2022 0306   K 3.7 10/07/2022 0306   CL 105 10/07/2022 0306   CO2 25 10/07/2022 0306   GLUCOSE 91 10/07/2022 0306   BUN 13 10/07/2022 0306   CREATININE 1.00 10/07/2022 0306   CREATININE 0.88 01/10/2021 0934   CREATININE 0.97 06/24/2020 1417   CALCIUM 9.0 10/07/2022 0306   PROT 8.5 (H) 10/02/2022 0943   ALBUMIN 5.0 10/02/2022 0943   AST 18 10/02/2022 0943   AST 21 01/10/2021 0934   ALT 18 10/02/2022 0943   ALT 12 01/10/2021 0934   ALKPHOS 109 10/02/2022 0943   BILITOT 1.0 10/02/2022 0943   BILITOT 0.7 01/10/2021 0934   GFRNONAA >60 10/07/2022 0306   GFRNONAA >60 01/10/2021 0934   GFRAA >60 07/15/2020 0048   GFRAA >60 07/04/2020 0845   Lipase     Component Value Date/Time   LIPASE 17 10/02/2022 0943       Studies/Results: DG Abd Portable 1V  Result Date: 10/05/2022 CLINICAL DATA:  Small-bowel  obstruction EXAM: PORTABLE ABDOMEN - 1 VIEW COMPARISON:  Radiograph 10/03/2022, CT 10/03/2019 FINDINGS: Persistent small bowel dilation in the lower abdomen. Contrast material retained within the colon, unchanged. IMPRESSION: Persistent small bowel dilation concerning for small bowel obstruction. Electronically Signed   By: Maurine Simmering M.D.   On: 10/05/2022 10:58    Anti-infectives: Anti-infectives (From admission, onward)    Start     Dose/Rate Route Frequency Ordered Stop   10/04/22 1200  piperacillin-tazobactam (ZOSYN) IVPB 3.375 g        3.375 g 12.5 mL/hr over 240 Minutes Intravenous Every 8 hours 10/04/22 0710     10/02/22 1915  piperacillin-tazobactam (ZOSYN) IVPB 3.375 g  Status:  Discontinued        3.375 g 100 mL/hr over 30 Minutes Intravenous Every 6 hours 10/02/22 1822 10/04/22 0710   10/02/22 1115  piperacillin-tazobactam (ZOSYN) IVPB 3.375 g        3.375 g 100 mL/hr over 30 Minutes Intravenous  Once 10/02/22 1108 10/02/22 1224        Assessment/Plan SBO hx of distal sigmoid cancer, 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 Stable for dc from surgical perspective   FEN: reg  diet ID: [id number] VTE: lovenox   I reviewed last 24 h vitals and pain scores, last 48 h intake and output, last 24 h labs and trends, and last 24 h imaging results.    LOS: 5 days    [facility] 10/07/2022, 8:42 AM Please see Amion for pager number during day hours 7:00am-4:30pm

## 2022-10-07 NOTE — Progress Notes (Signed)
Port de-accessed by IV team, tolerated by patient well.  Site with gauze dressing in place, clean, dry, and intact.  Needs addessed.  Discharged home.

## 2022-10-07 NOTE — Progress Notes (Signed)
Discharge instructions given to the patient.  Patient verbalized understanding.  Patient has been doing colostomy care independently.  Waiting for IV team to deaccess portacath.

## 2022-10-09 ENCOUNTER — Inpatient Hospital Stay: Payer: No Typology Code available for payment source | Attending: Oncology | Admitting: Oncology

## 2022-10-09 ENCOUNTER — Inpatient Hospital Stay: Payer: No Typology Code available for payment source

## 2022-10-09 VITALS — BP 143/66 | HR 83 | Temp 98.2°F | Resp 20 | Ht 70.0 in | Wt 191.6 lb

## 2022-10-09 DIAGNOSIS — C20 Malignant neoplasm of rectum: Secondary | ICD-10-CM | POA: Diagnosis not present

## 2022-10-09 DIAGNOSIS — K56609 Unspecified intestinal obstruction, unspecified as to partial versus complete obstruction: Secondary | ICD-10-CM | POA: Diagnosis not present

## 2022-10-09 DIAGNOSIS — Z95828 Presence of other vascular implants and grafts: Secondary | ICD-10-CM

## 2022-10-09 DIAGNOSIS — K565 Intestinal adhesions [bands], unspecified as to partial versus complete obstruction: Secondary | ICD-10-CM | POA: Diagnosis not present

## 2022-10-09 LAB — CEA (ACCESS): CEA (CHCC): 1.58 ng/mL (ref 0.00–5.00)

## 2022-10-09 MED ORDER — SODIUM CHLORIDE 0.9% FLUSH
10.0000 mL | Freq: Once | INTRAVENOUS | Status: AC
  Administered 2022-10-09: 10 mL

## 2022-10-09 MED ORDER — HEPARIN SOD (PORK) LOCK FLUSH 100 UNIT/ML IV SOLN
500.0000 [IU] | Freq: Once | INTRAVENOUS | Status: AC
  Administered 2022-10-09: 500 [IU]

## 2022-10-09 NOTE — Progress Notes (Signed)
[facility] OFFICE PROGRESS NOTE   Diagnosis: Rectal cancer  INTERVAL HISTORY:   [name] returns as scheduled.  He reports feeling well prior to Thanksgiving day when he developed acute onset abdominal pain and nausea/vomiting.  This began after Thanksgiving dinner.  He reports decreased output from the ostomy. He was seen in the emergency room.  A CT abdomen/pelvis revealed interval development of small bowel dilation with a transition zone in the pelvis. He was admitted and placed on bowel rest.  The obstruction resolved conservative measures.  He is now tolerating a diet.  [name] otherwise feels well.  He has persistent numbness in the feet.  Objective:  Vital signs in last 24 hours:  Blood pressure (!) 143/66, pulse 83, temperature 98.2 F (36.8 C), temperature source Oral, resp. rate 20, height 5' 10" (1.778 m), weight 191 lb 9.6 oz (86.9 kg), SpO2 100 %.     Lymphatics: No cervical, supraclavicular, axillary, or inguinal nodes Resp: Lungs clear bilaterally Cardio: Regular rate and rhythm GI: No hepatosplenomegaly, left lower quadrant colostomy with brown stool Vascular: No leg edema  Portacath/PICC-without erythema  Lab Results:  Lab Results  Component Value Date   WBC 4.3 10/07/2022   HGB 12.5 (L) 10/07/2022   HCT 35.1 (L) 10/07/2022   MCV 87.1 10/07/2022   PLT 198 10/07/2022   NEUTROABS 8.5 (H) 10/02/2022    CMP  Lab Results  Component Value Date   NA 136 10/07/2022   K 3.7 10/07/2022   CL 105 10/07/2022   CO2 25 10/07/2022   GLUCOSE 91 10/07/2022   BUN 13 10/07/2022   CREATININE 1.00 10/07/2022   CALCIUM 9.0 10/07/2022   PROT 8.5 (H) 10/02/2022   ALBUMIN 5.0 10/02/2022   AST 18 10/02/2022   ALT 18 10/02/2022   ALKPHOS 109 10/02/2022   BILITOT 1.0 10/02/2022   GFRNONAA >60 10/07/2022   GFRAA >60 07/15/2020    Lab Results  Component Value Date   CEA1 12.99 (H) 01/10/2021   CEA 1.82 04/16/2022    Lab Results   Component Value Date   INR 1.2 04/03/2021   LABPROT 15.0 04/03/2021    Imaging:  DG Abd Portable 1V  Result Date: 10/05/2022 CLINICAL DATA:  Small-bowel obstruction EXAM: PORTABLE ABDOMEN - 1 VIEW COMPARISON:  Radiograph 10/03/2022, CT 10/03/2019 FINDINGS: Persistent small bowel dilation in the lower abdomen. Contrast material retained within the colon, unchanged. IMPRESSION: Persistent small bowel dilation concerning for small bowel obstruction. Electronically Signed   By: Maurine Simmering M.D.   On: 10/05/2022 10:58    Medications: I have reviewed the patient's current medications.   Assessment/Plan: Rectal cancer CT abdomen/pelvis 06/27/2020-irregular masslike thickening of the rectum, increased colonic stool, perirectal infiltration into the presacral space, tiny bibasilar pulmonary nodules CT chest 07/12/2020-multiple small pulmonary nodules, largest 3 mm in the left apex MRI pelvis 07/12/2020-tumor at 11.4 centimeters from the anal verge, 6.6 cm from the internal sphincter, tumor extends through the right lateral rectal wall and directly involves an adjacent loop of distal sigmoid colon with invasion of anterior peritoneal reflection and involvement of the right seminal vesicle, no lymphadenopathy.  T4N0 Colonoscopy 07/17/2020-mass at 12 cm from the anal verge, completely obstructing mass at 23 cm from the anal verge, large internal hemorrhoids, biopsy confirmed adenocarcinoma Cycle 1 FOLFOX 07/30/2020 Cycle 2 FOLFOX 08/13/2020 Cycle 3 FOLFOX 08/27/2020 Cycle 4 FOLFOX 09/09/2020 Cycle 5 FOLFOX 09/24/2020, 5-FU bolus held, oxaliplatin dose reduced secondary to neutropenia Cycle 6 FOLFOX 10/07/2020, 5-FU bolus  and full dose oxaliplatin, Neulasta Cycle 7 FOLFOX 10/21/2020, Neulasta CTs 10/31/2020-no change in rectal mass, no evidence of nodal metastases, unchanged nonspecific lung nodules Cycle 8 FOLFOX 11/04/2020 Radiation/Xeloda  11/27/2020-01/03/2021 Low anterior resection 03/05/2021-residual  moderately differentiated adenocarcinoma, treatment response score 2, tumor extends to the sigmoid colon, 26 negative lymph nodes, [id number], negative resection margins, no loss of mismatch repair protein expression, MSI stable CT chest 01/06/2022-no evidence of metastatic disease, unchanged bilateral nodules compared to 2021 CTs 09/25/2022-no evidence of recurrent disease, unchanged presacral gas/fluid collection   2.  Iron deficiency anemia secondary to #1 3.  Hemorrhoids 4.  Rectal pain secondary to hemorrhoids versus the primary rectal tumor 5.  Constipation secondary to #1, improved with a stool softener and laxative regimen 6.  Small indeterminate pulmonary nodules 7.  Arthritis 8.  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 is in clinical remission from rectal cancer.  He has recovered from her recent hospital admission with a small bowel obstruction.  The obstruction was likely related to adhesions.  He plans to begin a liquid diet if the ostomy output slows in the future.  He will return for an office visit and CEA in 6 months.  [name] will be referred for Port-A-Cath removal.  Betsy Coder, MD  10/09/2022  10:46 AM

## 2022-10-10 ENCOUNTER — Emergency Department (HOSPITAL_BASED_OUTPATIENT_CLINIC_OR_DEPARTMENT_OTHER): Payer: No Typology Code available for payment source

## 2022-10-10 ENCOUNTER — Inpatient Hospital Stay (HOSPITAL_BASED_OUTPATIENT_CLINIC_OR_DEPARTMENT_OTHER)
Admission: EM | Admit: 2022-10-10 | Discharge: 2022-10-13 | DRG: 390 | Disposition: A | Discharge status: Home / Self Care | Payer: No Typology Code available for payment source | Attending: Internal Medicine | Admitting: Internal Medicine

## 2022-10-10 ENCOUNTER — Encounter (HOSPITAL_BASED_OUTPATIENT_CLINIC_OR_DEPARTMENT_OTHER): Payer: Self-pay

## 2022-10-10 ENCOUNTER — Encounter (HOSPITAL_COMMUNITY): Payer: Self-pay

## 2022-10-10 ENCOUNTER — Other Ambulatory Visit: Payer: Self-pay

## 2022-10-10 DIAGNOSIS — Z823 Family history of stroke: Secondary | ICD-10-CM

## 2022-10-10 DIAGNOSIS — Z8249 Family history of ischemic heart disease and other diseases of the circulatory system: Secondary | ICD-10-CM

## 2022-10-10 DIAGNOSIS — K5669 Other partial intestinal obstruction: Secondary | ICD-10-CM | POA: Diagnosis not present

## 2022-10-10 DIAGNOSIS — Z1152 Encounter for screening for COVID-19: Secondary | ICD-10-CM

## 2022-10-10 DIAGNOSIS — Z85048 Personal history of other malignant neoplasm of rectum, rectosigmoid junction, and anus: Secondary | ICD-10-CM

## 2022-10-10 DIAGNOSIS — R1900 Intra-abdominal and pelvic swelling, mass and lump, unspecified site: Secondary | ICD-10-CM | POA: Diagnosis present

## 2022-10-10 DIAGNOSIS — K565 Intestinal adhesions [bands], unspecified as to partial versus complete obstruction: Secondary | ICD-10-CM | POA: Diagnosis not present

## 2022-10-10 DIAGNOSIS — K56609 Unspecified intestinal obstruction, unspecified as to partial versus complete obstruction: Secondary | ICD-10-CM | POA: Diagnosis not present

## 2022-10-10 DIAGNOSIS — R823 Hemoglobinuria: Secondary | ICD-10-CM | POA: Diagnosis not present

## 2022-10-10 DIAGNOSIS — R809 Proteinuria, unspecified: Secondary | ICD-10-CM | POA: Diagnosis present

## 2022-10-10 DIAGNOSIS — Z9049 Acquired absence of other specified parts of digestive tract: Secondary | ICD-10-CM | POA: Diagnosis not present

## 2022-10-10 DIAGNOSIS — Z79899 Other long term (current) drug therapy: Secondary | ICD-10-CM

## 2022-10-10 DIAGNOSIS — R824 Acetonuria: Secondary | ICD-10-CM | POA: Diagnosis not present

## 2022-10-10 DIAGNOSIS — Z933 Colostomy status: Secondary | ICD-10-CM

## 2022-10-10 DIAGNOSIS — N289 Disorder of kidney and ureter, unspecified: Secondary | ICD-10-CM | POA: Diagnosis not present

## 2022-10-10 DIAGNOSIS — Z9221 Personal history of antineoplastic chemotherapy: Secondary | ICD-10-CM

## 2022-10-10 DIAGNOSIS — M170 Bilateral primary osteoarthritis of knee: Secondary | ICD-10-CM | POA: Diagnosis not present

## 2022-10-10 DIAGNOSIS — Z923 Personal history of irradiation: Secondary | ICD-10-CM

## 2022-10-10 DIAGNOSIS — Z87891 Personal history of nicotine dependence: Secondary | ICD-10-CM | POA: Diagnosis not present

## 2022-10-10 LAB — CBC WITH DIFFERENTIAL/PLATELET
Abs Immature Granulocytes: 0.04 10*3/uL (ref 0.00–0.07)
Basophils Absolute: 0.1 10*3/uL (ref 0.0–0.1)
Basophils Relative: 1 %
Eosinophils Absolute: 0 10*3/uL (ref 0.0–0.5)
Eosinophils Relative: 0 %
HCT: 41.4 % (ref 39.0–52.0)
Hemoglobin: 14.3 g/dL (ref 13.0–17.0)
Immature Granulocytes: 1 %
Lymphocytes Relative: 7 %
Lymphs Abs: 0.6 10*3/uL — ABNORMAL LOW (ref 0.7–4.0)
MCH: 30.4 pg (ref 26.0–34.0)
MCHC: 34.5 g/dL (ref 30.0–36.0)
MCV: 87.9 fL (ref 80.0–100.0)
Monocytes Absolute: 0.4 10*3/uL (ref 0.1–1.0)
Monocytes Relative: 4 %
Neutro Abs: 7.5 10*3/uL (ref 1.7–7.7)
Neutrophils Relative %: 87 %
Platelets: 276 10*3/uL (ref 150–400)
RBC: 4.71 MIL/uL (ref 4.22–5.81)
RDW: 12 % (ref 11.5–15.5)
WBC: 8.6 10*3/uL (ref 4.0–10.5)
nRBC: 0 % (ref 0.0–0.2)

## 2022-10-10 LAB — URINALYSIS, ROUTINE W REFLEX MICROSCOPIC
Bilirubin Urine: NEGATIVE
Glucose, UA: NEGATIVE mg/dL
Ketones, ur: 15 mg/dL — AB
Leukocytes,Ua: NEGATIVE
Nitrite: NEGATIVE
Protein, ur: 100 mg/dL — AB
Specific Gravity, Urine: >1.046 — ABNORMAL HIGH (ref 1.005–1.030)
pH: 8.5 — ABNORMAL HIGH (ref 5.0–8.0)

## 2022-10-10 LAB — BASIC METABOLIC PANEL
Anion gap: 11 (ref 5–15)
BUN: 17 mg/dL (ref 8–23)
CO2: 27 mmol/L (ref 22–32)
Calcium: 10.2 mg/dL (ref 8.9–10.3)
Chloride: 101 mmol/L (ref 98–111)
Creatinine, Ser: 0.97 mg/dL (ref 0.61–1.24)
GFR, Estimated: >60 mL/min (ref >60)
Glucose, Bld: 138 mg/dL — ABNORMAL HIGH (ref 70–99)
Potassium: 3.9 mmol/L (ref 3.5–5.1)
Sodium: 139 mmol/L (ref 135–145)

## 2022-10-10 MED ORDER — SODIUM CHLORIDE 0.9 % IV BOLUS
1000.0000 mL | Freq: Once | INTRAVENOUS | Status: AC
  Administered 2022-10-10: 1000 mL via INTRAVENOUS

## 2022-10-10 MED ORDER — IOHEXOL 300 MG/ML  SOLN
100.0000 mL | Freq: Once | INTRAMUSCULAR | Status: AC | PRN
  Administered 2022-10-10: 100 mL via INTRAVENOUS

## 2022-10-10 MED ORDER — FENTANYL CITRATE PF 50 MCG/ML IJ SOSY
PREFILLED_SYRINGE | INTRAMUSCULAR | Status: AC
  Filled 2022-10-10: qty 1

## 2022-10-10 MED ORDER — PROMETHAZINE HCL 25 MG/ML IJ SOLN
INTRAMUSCULAR | Status: AC
  Filled 2022-10-10: qty 1

## 2022-10-10 MED ORDER — LACTATED RINGERS IV BOLUS
1000.0000 mL | Freq: Once | INTRAVENOUS | Status: AC
  Administered 2022-10-10: 1000 mL via INTRAVENOUS

## 2022-10-10 MED ORDER — MORPHINE SULFATE (PF) 4 MG/ML IV SOLN
4.0000 mg | Freq: Once | INTRAVENOUS | Status: AC
  Administered 2022-10-10: 4 mg via INTRAVENOUS
  Filled 2022-10-10: qty 1

## 2022-10-10 MED ORDER — SODIUM CHLORIDE 0.9 % IV SOLN
12.5000 mg | Freq: Once | INTRAVENOUS | Status: AC
  Administered 2022-10-10: 12.5 mg via INTRAVENOUS
  Filled 2022-10-10: qty 0.5

## 2022-10-10 MED ORDER — ONDANSETRON HCL 4 MG PO TABS: 4.0000 mg | ORAL_TABLET | Freq: Four times a day (QID) | ORAL | Status: DC | PRN

## 2022-10-10 MED ORDER — HYDROMORPHONE HCL 1 MG/ML IJ SOLN
1.0000 mg | Freq: Once | INTRAMUSCULAR | Status: AC
  Administered 2022-10-10: 1 mg via INTRAVENOUS
  Filled 2022-10-10: qty 1

## 2022-10-10 MED ORDER — ONDANSETRON HCL 4 MG/2ML IJ SOLN
4.0000 mg | Freq: Four times a day (QID) | INTRAMUSCULAR | Status: DC | PRN
  Administered 2022-10-10 – 2022-10-12 (×7): 4 mg via INTRAVENOUS
  Filled 2022-10-10 (×7): qty 2

## 2022-10-10 MED ORDER — ACETAMINOPHEN 325 MG PO TABS: 650.0000 mg | ORAL_TABLET | Freq: Four times a day (QID) | ORAL | Status: DC | PRN

## 2022-10-10 MED ORDER — ACETAMINOPHEN 650 MG RE SUPP: 650.0000 mg | Freq: Four times a day (QID) | RECTAL | Status: DC | PRN

## 2022-10-10 MED ORDER — MORPHINE SULFATE (PF) 2 MG/ML IV SOLN
2.0000 mg | INTRAVENOUS | Status: DC | PRN
  Administered 2022-10-10 – 2022-10-12 (×11): 2 mg via INTRAVENOUS
  Filled 2022-10-10 (×11): qty 1

## 2022-10-10 MED ORDER — METOCLOPRAMIDE HCL 5 MG/ML IJ SOLN
5.0000 mg | Freq: Once | INTRAMUSCULAR | Status: AC
  Administered 2022-10-10: 5 mg via INTRAVENOUS
  Filled 2022-10-10: qty 2

## 2022-10-10 MED ORDER — CHLORHEXIDINE GLUCONATE CLOTH 2 % EX PADS
6.0000 | MEDICATED_PAD | Freq: Every day | CUTANEOUS | Status: DC
  Administered 2022-10-10 – 2022-10-12 (×3): 6 via TOPICAL

## 2022-10-10 MED ORDER — POTASSIUM CHLORIDE IN NACL 20-0.9 MEQ/L-% IV SOLN
INTRAVENOUS | Status: DC
  Filled 2022-10-10 (×3): qty 1000

## 2022-10-10 MED ORDER — FENTANYL CITRATE PF 50 MCG/ML IJ SOSY
50.0000 ug | PREFILLED_SYRINGE | Freq: Once | INTRAMUSCULAR | Status: AC
  Administered 2022-10-10: 50 ug via INTRAVENOUS

## 2022-10-10 MED ORDER — ENOXAPARIN SODIUM 40 MG/0.4ML IJ SOSY
40.0000 mg | PREFILLED_SYRINGE | INTRAMUSCULAR | Status: DC
  Administered 2022-10-10 – 2022-10-12 (×3): 40 mg via SUBCUTANEOUS
  Filled 2022-10-10 (×3): qty 0.4

## 2022-10-10 MED ORDER — SODIUM CHLORIDE 0.9% FLUSH: 10.0000 mL | INTRAVENOUS | Status: DC | PRN

## 2022-10-10 MED ORDER — HYDROMORPHONE HCL 1 MG/ML IJ SOLN
INTRAMUSCULAR | Status: AC
  Administered 2022-10-10: 1 mg via INTRAVENOUS
  Filled 2022-10-10: qty 1

## 2022-10-10 MED ORDER — LORAZEPAM 2 MG/ML IJ SOLN
0.7500 mg | Freq: Once | INTRAMUSCULAR | Status: AC | PRN
  Administered 2022-10-10: 0.75 mg via INTRAVENOUS
  Filled 2022-10-10: qty 1

## 2022-10-10 NOTE — H&P (Signed)
History and Physical    Patient: Shane [name]. JKD:[id number] DOB: [birth year] DOA: 10/10/2022 DOS: the patient was seen and examined on 10/10/2022 PCP: Marrian Salvage, [place]  Patient coming from: Home  Chief Complaint:  Chief Complaint  Patient presents with   Abdominal Pain   HPI: Shane [name]. is a 71 y.o. male with medical history significant of seasonal allergy, osteoarthritis of the knees, pulmonary nodules, history of carpal tunnel and carpal tunnel release, history of upper GI bleed, enterococcal sepsis, history of AKI, history of rectal cancer, partial colectomy, colostomy, history of ileus and history of small bowel obstruction who was recently admitted and discharged due to small bowel obstruction who is returning to the emergency department this morning after his colostomy output decreased significantly, developing abdominal pain followed by 10 episodes of emesis this morning. No diarrhea, constipation, melena or hematochezia.  No flank pain, dysuria, frequency or hematuria. He denied fever, chills, rhinorrhea, sore throat, wheezing or hemoptysis.  No chest pain, palpitations, diaphoresis, PND, orthopnea or pitting edema of the lower extremities.   No polyuria, polydipsia, polyphagia or blurred vision.   ED course: Initial vital signs were temperature 97.9 F, pulse 94, respiration 18, BP 140/79 mmHg O2 sat 100% on room air.  The patient received hydromorphone 1 mg IVPB, fentanyl 50 mcg IVPB, morphine 4 mg IVPB, Phenergan 12.5 mg IVPB and 1000 mL of normal saline bolus.  Lab work: Urinalysis with increase of specific gravity higher than 1.0 46, small hemoglobinuria, ketonuria 15 and proteinuria 100 mg/dL.  His CBC is her white count 8.6, hemoglobin 14.3 g/dL platelets 276.  BMP with a glucose of 138 mg/dL, the rest of the BMP measurements were normal.  Imaging: CT abdomen/pelvis with contrast showing persistent and worsening small bowel obstruction presumably  secondary to adhesions in the low anatomic pelvis as above.  There is fecalith Seshan small bowel contents in the dilated small bowel in the low anatomic pelvis, indicative of lower small intestinal bacterial overgrowth.  Postop changes a low anterior resection left lower quadrant colostomy and chronic malignant appearing soft tissue in the resection bed distending into the presacral space.  Large bilateral hydrocele is free demonstrated.  Aortic atherosclerosis.   Review of Systems: As mentioned in the history of present illness. All other systems reviewed and are negative. Past Medical History:  Diagnosis Date   Allergy    Arthritis    knee   Rectal cancer ([place]) 08/2020   Past Surgical History:  Procedure Laterality Date   ESOPHAGOGASTRODUODENOSCOPY (EGD) WITH PROPOFOL N/A 03/14/2021   Procedure: ESOPHAGOGASTRODUODENOSCOPY (EGD) WITH PROPOFOL;  Surgeon: Lavena Bullion, DO;  Location: [facility];  Service: Gastroenterology;  Laterality: N/A;   IR CATHETER TUBE CHANGE  07/29/2021   IR IMAGING GUIDED PORT INSERTION  07/25/2020   IR RADIOLOGIST EVAL & MGMT  04/22/2021   IR RADIOLOGIST EVAL & MGMT  05/07/2021   IR RADIOLOGIST EVAL & MGMT  07/24/2021   IR RADIOLOGIST EVAL & MGMT  09/17/2021   IR SINUS/FIST TUBE CHK-NON GI  04/10/2021   IR SINUS/FIST TUBE CHK-NON GI  04/10/2021   IR US GUIDE BX ASP/DRAIN  03/11/2021   OSTOMY N/A 03/05/2021   Procedure: OSTOMY;  Surgeon: Leighton Ruff, MD;  Location: [facility]S;  Service: General;  Laterality: N/A;   WISDOM TOOTH EXTRACTION     XI ROBOTIC ASSISTED LOWER ANTERIOR RESECTION N/A 03/05/2021   Procedure: XI ROBOTIC ASSISTED LOWER ANTERIOR RESECTION WITH MOBILIZATION OF THE SPLENIC FLEXURE OF  THE COLON AND END COLOSTOMY;  Surgeon: Leighton Ruff, MD;  Location: [facility];  Service: General;  Laterality: N/A;  6 HOURS   Social History:  reports that he quit smoking about 44 years ago. His smoking use included cigarettes. He has a 10.00 pack-year smoking history. He  has never used smokeless tobacco. He reports that he does not drink alcohol and does not use drugs.  No Known Allergies  Family History  Problem Relation Age of Onset   Healthy Mother    Hypertension Father    Stroke Father    Colon cancer Neg Hx    Esophageal cancer Neg Hx    Stomach cancer Neg Hx    Rectal cancer Neg Hx     Prior to Admission medications   Medication Sig Start Date End Date Taking? Authorizing Provider  diclofenac (VOLTAREN) 75 MG EC tablet Take 75 mg by mouth 3 times/day as needed-between meals & bedtime (pain). 05/18/22   [provider]  furosemide (LASIX) 20 MG tablet Take 1 tablet (20 mg total) by mouth daily as needed for fluid. Patient taking differently: Take 20 mg by mouth in the morning. 06/29/22   Marrian Salvage, FNP  pravastatin (PRAVACHOL) 20 MG tablet TAKE 1 TABLET BY MOUTH EVERY DAY Patient taking differently: Take 20 mg by mouth in the morning. 09/16/22   Marrian Salvage, FNP  valsartan (DIOVAN) 80 MG tablet Take 1 tablet (80 mg total) by mouth daily. Patient taking differently: Take 80 mg by mouth in the morning. 07/17/22   Marrian Salvage, FNP    Physical Exam: Vitals:   10/10/22 1215 10/10/22 1246 10/10/22 1336 10/10/22 1422  BP: (!) 160/86 (!) 159/88 (!) 159/67 (!) 146/68  Pulse: 95 (!) 105 83 81  Resp:  '18 18 17  '$ Temp:  98.5 F (36.9 C) 98.2 F (36.8 C) 98.9 F (37.2 C)  TempSrc:   Oral   SpO2: 98% 99% 100% 99%  Weight:      Height:       Physical Exam Vitals and nursing note reviewed.  Constitutional:      General: He is awake. He is not in acute distress.    Appearance: He is well-developed. He is ill-appearing.     Comments: Chronically ill-appearing  HENT:     Head: Normocephalic.     Nose: No rhinorrhea.     Mouth/Throat:     Mouth: Mucous membranes are dry.  Eyes:     General: No scleral icterus.    Pupils: Pupils are equal, round, and reactive to light.  Neck:     Vascular: No JVD.   Cardiovascular:     Rate and Rhythm: Normal rate and regular rhythm.     Heart sounds: S1 normal and S2 normal.  Pulmonary:     Effort: Pulmonary effort is normal. No respiratory distress.     Breath sounds: No wheezing, rhonchi or rales.  Abdominal:     General: The ostomy site is clean.     Palpations: Abdomen is soft.     Tenderness: There is abdominal tenderness in the periumbilical area. There is no right CVA tenderness, left CVA tenderness, guarding or rebound.  Musculoskeletal:     Cervical back: Neck supple.     Right lower leg: No edema.     Left lower leg: No edema.  Skin:    General: Skin is warm and dry.  Neurological:     General: No focal deficit present.  Mental Status: He is alert and oriented to person, place, and time.  Psychiatric:        Mood and Affect: Mood normal.        Behavior: Behavior is cooperative.   Data Reviewed:  There are no new results to review at this time.  Assessment and Plan: Principal Problem:   Small bowel obstruction [facility]) Inpatient MedSurg. Keep NPO. Continue IV fluids. Analgesics as needed. Antiemetics as needed. Pantoprazole 40 mg IVP every 24 hours. Keep electrolytes optimized. Follow-up CBC and CMP in AM. Follow-up imaging in the morning. General surgery input appreciated.    Pelvic mass Present on multiple studies. Will discuss with primary oncologist.    Advance Care Planning:   Code Status: Full Code   Consults: General surgery Jovita Kussmaul, MD).  Family Communication:   Severity of Illness: The appropriate patient status for this patient is INPATIENT. Inpatient status is judged to be reasonable and necessary in order to provide the required intensity of service to ensure the patient's safety. The patient's presenting symptoms, physical exam findings, and initial radiographic and laboratory data in the context of their chronic comorbidities is felt to place them at high risk for further clinical  deterioration. Furthermore, it is not anticipated that the patient will be medically stable for discharge from the hospital within 2 midnights of admission.   * I certify that at the point of admission it is my clinical judgment that the patient will require inpatient hospital care spanning beyond 2 midnights from the point of admission due to high intensity of service, high risk for further deterioration and high frequency of surveillance required.*  Author: Reubin Milan, MD 10/10/2022 2:28 PM  For on call review [email or web address].   This document was prepared using Dragon voice recognition software and may contain some unintended transcription errors.

## 2022-10-10 NOTE — ED Triage Notes (Signed)
POV, pt c/o bilateral lower abdominal pain that began approx 8pm last night. Pt was d/c from [facility] on 11/24 for SBO. Sts normal BM yesterday, sts passing gas yesterday but not since pain started. Pt sts vomiting x 10. A&O x4, ambulatory.

## 2022-10-10 NOTE — Progress Notes (Signed)
Hospitalist Transfer Note:  Transferring facility: [facility] Requesting provider: Dr. Randal Buba (EDP at [facility]) Reason for transfer: admission for further evaluation and management of sbo.     71 y.o.  male w/ rectal cancer, s/p ostomy, recent admit for sbo, who presented to [facility] ED complaining of  abdominal pain starting just after 2000 on 10/09/22.  He notes most recent output via his ostomy occurring right around 8 PM on 10/09/2022, just prior to noting acute onset of his abdominal discomfort, as above.  He reports that the nature of the symptoms as well as this sequence are very similar to that which she had just experienced with a small bowel obstruction prompting admission to the [facility] system with her recent discharge to home on November 29.  In setting of his rectal cancer, he follows with Dr. Learta Codding As his outpatient oncologist.CT abd/pelvis today shows recurrence of small bowel obstruction.  EDP conveys that she will attempt ngt placement.   Subsequently, I accepted this patient for transfer for inpatient admission to a med-surg bed at [facility] for further work-up and management of sbo . I choose [facility] specifically for availability of oncologic services.     Check [email or web address] for on-call coverage.   Nursing staff, Please call [place] number on Amion as soon as patient's arrival, so appropriate admitting provider can evaluate the pt.     Babs Bertin, DO Hospitalist

## 2022-10-10 NOTE — ED Provider Notes (Signed)
[facility] DEPT Provider Note   CSN: [id number] Arrival date & time: 10/10/22  2536     History  Chief Complaint  Patient presents with   Abdominal Pain    Shane [name]. is a 71 y.o. male.  The history is provided by the patient.  Abdominal Pain Pain location:  LLQ and RLQ Pain radiates to:  Does not radiate Pain severity:  Severe Onset quality:  Gradual Duration:  11 hours Timing:  Constant Progression:  Worsening Chronicity:  Recurrent Context: eating   Context: not retching   Relieved by:  Nothing Worsened by:  Nothing Ineffective treatments:  None tried Associated symptoms: nausea   Associated symptoms: no constipation and no fever   Risk factors: recent hospitalization   Patient with SBO with recurrent pain.  Last stool and gas 630 pm after eating sausage.       Home Medications Prior to Admission medications   Medication Sig Start Date End Date Taking? Authorizing Provider  diclofenac (VOLTAREN) 75 MG EC tablet Take 75 mg by mouth 3 times/day as needed-between meals & bedtime (pain). 05/18/22   [provider]  furosemide (LASIX) 20 MG tablet Take 1 tablet (20 mg total) by mouth daily as needed for fluid. Patient taking differently: Take 20 mg by mouth in the morning. 06/29/22   Marrian Salvage, FNP  pravastatin (PRAVACHOL) 20 MG tablet TAKE 1 TABLET BY MOUTH EVERY DAY Patient taking differently: Take 20 mg by mouth in the morning. 09/16/22   Marrian Salvage, FNP  valsartan (DIOVAN) 80 MG tablet Take 1 tablet (80 mg total) by mouth daily. Patient taking differently: Take 80 mg by mouth in the morning. 07/17/22   Marrian Salvage, [place]      Allergies    Patient has no known allergies.    Review of Systems   Review of Systems  Constitutional:  Negative for fever.  HENT:  Negative for facial swelling.   Eyes:  Negative for photophobia.  Respiratory:  Negative for stridor.   Gastrointestinal:   Positive for abdominal pain and nausea. Negative for constipation.  All other systems reviewed and are negative.   Physical Exam Updated Vital Signs BP (!) 148/79 (BP Location: Right Arm)   Pulse 94   Temp 97.9 F (36.6 C)   Resp 18   Ht '5\' 10"'$  (1.778 m)   Wt 86.2 kg   SpO2 100%   BMI 27.26 kg/m  Physical Exam Vitals and nursing note reviewed.  Constitutional:      General: He is not in acute distress.    Appearance: He is well-developed. He is not diaphoretic.  HENT:     Head: Normocephalic and atraumatic.  Eyes:     Conjunctiva/sclera: Conjunctivae normal.     Pupils: Pupils are equal, round, and reactive to light.  Cardiovascular:     Rate and Rhythm: Normal rate and regular rhythm.     Pulses: Normal pulses.     Heart sounds: Normal heart sounds.  Pulmonary:     Effort: Pulmonary effort is normal.     Breath sounds: Normal breath sounds. No wheezing or rales.  Abdominal:     General: Bowel sounds are decreased.     Palpations: Abdomen is soft.     Tenderness: There is abdominal tenderness. There is no guarding or rebound.  Musculoskeletal:        General: Normal range of motion.     Cervical back: Normal range of motion and neck  supple.  Skin:    General: Skin is warm and dry.     Capillary Refill: Capillary refill takes less than 2 seconds.  Neurological:     General: No focal deficit present.     Mental Status: He is alert and oriented to person, place, and time.     Deep Tendon Reflexes: Reflexes normal.  Psychiatric:        Mood and Affect: Mood normal.        Behavior: Behavior normal.     ED Results / Procedures / Treatments   Labs (all labs ordered are listed, but only abnormal results are displayed) Results for orders placed or performed during the hospital encounter of 10/10/22  CBC with Differential  Result Value Ref Range   WBC 8.6 4.0 - 10.5 K/uL   RBC 4.71 4.22 - 5.81 MIL/uL   Hemoglobin 14.3 13.0 - 17.0 g/dL   HCT 41.4 39.0 - 52.0 %    MCV 87.9 80.0 - 100.0 fL   MCH 30.4 26.0 - 34.0 pg   MCHC 34.5 30.0 - 36.0 g/dL   RDW 12.0 11.5 - 15.5 %   Platelets 276 150 - 400 K/uL   nRBC 0.0 0.0 - 0.2 %   Neutrophils Relative % 87 %   Neutro Abs 7.5 1.7 - 7.7 K/uL   Lymphocytes Relative 7 %   Lymphs Abs 0.6 (L) 0.7 - 4.0 K/uL   Monocytes Relative 4 %   Monocytes Absolute 0.4 0.1 - 1.0 K/uL   Eosinophils Relative 0 %   Eosinophils Absolute 0.0 0.0 - 0.5 K/uL   Basophils Relative 1 %   Basophils Absolute 0.1 0.0 - 0.1 K/uL   Immature Granulocytes 1 %   Abs Immature Granulocytes 0.04 0.00 - 0.07 K/uL  Basic metabolic panel  Result Value Ref Range   Sodium 139 135 - 145 mmol/L   Potassium 3.9 3.5 - 5.1 mmol/L   Chloride 101 98 - 111 mmol/L   CO2 27 22 - 32 mmol/L   Glucose, Bld 138 (H) 70 - 99 mg/dL   BUN 17 8 - 23 mg/dL   Creatinine, Ser 0.97 0.61 - 1.24 mg/dL   Calcium 10.2 8.9 - 10.3 mg/dL   GFR, Estimated >60 >60 mL/min   Anion gap 11 5 - 15   CT ABDOMEN PELVIS W CONTRAST  Result Date: 10/10/2022 CLINICAL DATA:  71 year old male with history of lower abdominal pain. History of rectal cancer status post surgical resection. * Tracking Code: BO * EXAM: CT ABDOMEN AND PELVIS WITH CONTRAST TECHNIQUE: Multidetector CT imaging of the abdomen and pelvis was performed using the standard protocol following bolus administration of intravenous contrast. RADIATION DOSE REDUCTION: This exam was performed according to the departmental dose-optimization program which includes automated exposure control, adjustment of the mA and/or kV according to patient size and/or use of iterative reconstruction technique. CONTRAST:  162m OMNIPAQUE IOHEXOL 300 MG/ML  SOLN COMPARISON:  CT of the abdomen and pelvis 10/02/2022. FINDINGS: Lower chest: Mild linear scarring in the lower lobes of the lungs bilaterally. Hepatobiliary: No suspicious cystic or solid hepatic lesions. Minimal intrahepatic biliary ductal dilatation in the right lobe of the liver,  similar to prior examinations, presumably benign. No dilatation of the common bile duct. Gallbladder is normal in appearance. Pancreas: No pancreatic mass. No pancreatic ductal dilatation. No pancreatic or peripancreatic fluid collections or inflammatory changes. Spleen: Unremarkable. Adrenals/Urinary Tract: Low-attenuation lesions in the right kidney compatible with simple cysts, measuring up to 2.8 cm in  the lower pole. Other subcentimeter low-attenuation lesions in both kidneys, too small to definitively characterize, but statistically likely to represent tiny cysts (no imaging follow-up recommended). Bilateral adrenal glands are normal in appearance. No hydroureteronephrosis. Urinary bladder is largely decompressed, but otherwise unremarkable in appearance. Stomach/Bowel: The appearance of the stomach is unremarkable. Multiple dilated loops of small bowel are again noted, with multiple air-fluid levels. The degree of small-bowel dilatation is increased compared to the prior study, with small bowel loops measuring up to 5 cm on today's examination in the upper abdomen. Terminal ileum appears decompressed. There is some gas and stool in the colon, along with retained oral contrast material. Postoperative changes of low anterior resection with left lower quadrant colostomy. Again noted is a large amount of malignant appearing soft tissue in the low anatomic pelvis extending from the site of the resected rectal mass into the presacral space. This is irregular in shape and therefore difficult to accurately measure, but is estimated to be approximately 6.8 x 4.9 x 8.0 cm (axial image 76 of series 2 and sagittal image 55 of series 6) on today's examination, with a centrally necrotic region which demonstrates both internal fat and fluid density, similar to prior examinations. Multiple small bowel loops appear tethered in this region, likely from underlying adhesions. One of these loops is now severely distended (5.6 cm  in diameter) and demonstrates fecalization of internal contents. Vascular/Lymphatic: Aortic atherosclerosis, without evidence of aneurysm or dissection in the abdominal or pelvic vasculature. No lymphadenopathy noted in the abdomen or pelvis. Reproductive: Prostate gland and seminal vesicles are unremarkable in appearance. Large bilateral hydroceles noted in the partially imaged scrotum. Other: No substantial volume of ascites.  No pneumoperitoneum. Musculoskeletal: There are no aggressive appearing lytic or blastic lesions noted in the visualized portions of the skeleton. IMPRESSION: 1. Persistent and worsening small-bowel obstruction presumably secondary to adhesions in the low anatomic pelvis, as above. 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 2. Postoperative changes of low anterior resection with left lower quadrant colostomy and chronic malignant appearing soft tissue in the resection bed extending into the presacral space, similar to the recent prior study. 3. Large bilateral hydroceles redemonstrated. 4. Aortic atherosclerosis. 5. Additional incidental findings, as above. Electronically Signed   By: Vinnie Langton M.D.   On: 10/10/2022 06:55   DG Abd Portable 1V  Result Date: 10/05/2022 CLINICAL DATA:  Small-bowel obstruction EXAM: PORTABLE ABDOMEN - 1 VIEW COMPARISON:  Radiograph 10/03/2022, CT 10/03/2019 FINDINGS: Persistent small bowel dilation in the lower abdomen. Contrast material retained within the colon, unchanged. IMPRESSION: Persistent small bowel dilation concerning for small bowel obstruction. Electronically Signed   By: Maurine Simmering M.D.   On: 10/05/2022 10:58   DG Abd 1 View  Result Date: 10/03/2022 CLINICAL DATA:  Small bowel obstruction. EXAM: ABDOMEN - 1 VIEW COMPARISON:  CT scan of the abdomen and pelvis October 02, 2022 FINDINGS: Contrast identified  throughout the length of the colon. Continued small bowel obstruction with dilated small bowel loops. This film is not significantly different compared to yesterday scout film. Lung bases are normal. No free air, portal venous gas, or pneumatosis identified. IMPRESSION: Continued small bowel obstruction as above. Electronically Signed   By: Dorise Bullion III M.D.   On: 10/03/2022 11:55   CT ABDOMEN PELVIS W CONTRAST  Result Date: 10/02/2022 CLINICAL DATA:  Acute generalized abdominal pain, vomiting. EXAM: CT ABDOMEN  AND PELVIS WITH CONTRAST TECHNIQUE: Multidetector CT imaging of the abdomen and pelvis was performed using the standard protocol following bolus administration of intravenous contrast. RADIATION DOSE REDUCTION: This exam was performed according to the departmental dose-optimization program which includes automated exposure control, adjustment of the mA and/or kV according to patient size and/or use of iterative reconstruction technique. CONTRAST:  168m OMNIPAQUE IOHEXOL 300 MG/ML  SOLN COMPARISON:  September 25, 2022. FINDINGS: Lower chest: No acute abnormality. Hepatobiliary: No focal liver abnormality is seen. No gallstones, gallbladder wall thickening, or biliary dilatation. Pancreas: Unremarkable. No pancreatic ductal dilatation or surrounding inflammatory changes. Spleen: Normal in size without focal abnormality. Adrenals/Urinary Tract: Adrenal glands appear normal. Stable right renal cysts are noted for which no further follow-up is required. No hydronephrosis or renal obstruction is noted. Urinary bladder is unremarkable. Stomach/Bowel: The stomach is unremarkable. Colostomy is noted in left lower quadrant. There is interval development of significant small bowel dilatation, with transition zone seen in the pelvis best seen on image number 35 of series 5. This may be due to adhesion or scarring. Vascular/Lymphatic: Aortic atherosclerosis. No enlarged abdominal or pelvic lymph nodes.  Reproductive: Prostate is unremarkable. Bilateral hydroceles are again noted. Other: Status post surgical resection of rectum. There is again noted large soft tissue abnormality in the presacral region measuring 7.0 x 5.9 cm. There is continued presence air and fluid collection within this area measuring 3.6 x 3.2 cm. This is slightly increased compared to prior exam. Musculoskeletal: No acute or significant osseous findings. IMPRESSION: Status post rectal resection with colostomy placement and left lower quadrant. There is interval development of significant small bowel dilatation, with transition zone seen in the pelvis. This is highly concerning for obstruction secondary to adhesion or scarring. Continued presence of large soft tissue abnormality in the presacral region measuring 7.0 x 5.9 cm. It is uncertain if this represents postoperative scarring or inflammation or possibly neoplasm. 3.6 x 3.2 cm air and fluid collection is noted within this area which is increased compared to prior exam slightly; this is concerning for possible abscess. Bilateral hydroceles are again noted. Aortic Atherosclerosis (ICD10-I70.0). Electronically Signed   By: JMarijo ConceptionM.D.   On: 10/02/2022 11:41   CT CHEST ABDOMEN PELVIS W CONTRAST  Result Date: 09/26/2022 CLINICAL DATA:  Colorectal carcinoma. Surveillance exam. Chemotherapy and radiation therapy complete 2002. * Tracking Code: BO * EXAM: CT CHEST, ABDOMEN, AND PELVIS WITH CONTRAST TECHNIQUE: Multidetector CT imaging of the chest, abdomen and pelvis was performed following the standard protocol during bolus administration of intravenous contrast. RADIATION DOSE REDUCTION: This exam was performed according to the departmental dose-optimization program which includes automated exposure control, adjustment of the mA and/or kV according to patient size and/or use of iterative reconstruction technique. CONTRAST:  1060mISOVUE-300 IOPAMIDOL (ISOVUE-300) INJECTION 61%  COMPARISON:  CT 09/17/2021 FINDINGS: CT CHEST FINDINGS Cardiovascular: No significant vascular findings. Normal heart size. No pericardial effusion. Port in the anterior chest wall with tip in distal SVC. Mediastinum/Nodes: LEFT lateral to the proximal esophagus at the level of the thyroid gland there is a pouch which contains food stuff measuring 20 mm by 21 mm (5/2. This is similar to comparison CT from 2021. 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 No mediastinal adenopathy.  No axillary supraclavicular adenopathy. Lungs/Pleura: Mild basilar atelectasis. No suspicious pulmonary nodules. Musculoskeletal: No aggressive osseous lesion. CT ABDOMEN AND PELVIS FINDINGS Hepatobiliary: No focal hepatic lesion. No biliary ductal dilatation. Gallbladder is normal. Common bile duct is normal.  Pancreas: Pancreas is normal. No ductal dilatation. No pancreatic inflammation. Spleen: Normal spleen Adrenals/urinary tract: Adrenal glands and kidneys are normal. The ureters and bladder normal. Stomach/Bowel: Stomach, duodenum small-bowel normal. Terminal ileum normal. Moderate volume stool in the ascending colon. Large volume stool in the transverse colon. The descending colon exits a LEFT abdominal wall ostomy without obstruction. Prior resection of the rectum. Interval removal of drainage catheter from the presacral gas and fluid collection. The gas and fluid collection in the presacral space measures 3.1 by 2.6 cm compared to 3.3 x 3.6 cm. There is increased gas within the collection. Thin enhancing rim. Overall collections very similar. There is presacral on thickening of the soft tissues presacral space. No pelvic adenopathy Vascular/Lymphatic: Abdominal aorta is normal caliber. There is no retroperitoneal or periportal lymphadenopathy. No pelvic lymphadenopathy. Reproductive: Large bilateral hydroceles unchanged from prior. The larger LEFT hydrocele measures up to 10 cm. Other: No free fluid.  Musculoskeletal: No aggressive osseous lesion. IMPRESSION: Chest Impression: 1. No evidence of thoracic metastasis. 2. 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 Abdomen / Pelvis Impression: 1. No evidence of colorectal carcinoma recurrence or metastasis in the abdomen pelvis. 2. LEFT colostomy without complication. 3. Presacral well-circumscribed gas and fluid collection not changed from comparison CT 09/17/2021. 4. Large bilateral scrotal hydroceles. Electronically Signed   By: Suzy Bouchard M.D.   On: 09/26/2022 14:19    Radiology No results found.  Procedures Procedures    Medications Ordered in ED Medications  morphine (PF) 4 MG/ML injection 4 mg (has no administration in time range)  fentaNYL (SUBLIMAZE) injection 50 mcg (50 mcg Intravenous Given 10/10/22 0603)  iohexol (OMNIPAQUE) 300 MG/ML solution 100 mL (100 mLs Intravenous Contrast Given 10/10/22 0620)    ED Course/ Medical Decision Making/ A&P                           Medical Decision Making Patient with SBO   Amount and/or Complexity of Data Reviewed Independent Historian: spouse    Details: See above  External Data Reviewed: labs, radiology and notes.    Details: Previous admission reviewed  Labs: ordered.    Details: All labs reviewed: normal white count 8.6, normal hemoglobin 14.3, normal platelet count.  Normal sodium 139, 3.9, normal creatinine .51 Radiology: ordered and independent interpretation performed.    Details: SBO on CT by me   Risk Prescription drug management. Parenteral controlled substances. Decision regarding hospitalization. Risk Details: Nasogastric tube ordered.      Final Clinical Impression(s) / ED Diagnoses Final diagnoses:  SBO (small bowel obstruction) ([place])   The patient appears reasonably stabilized for admission considering the current resources, flow, and capabilities available in the ED at this time, and I doubt any other [facility] requiring  further screening and/or treatment in the ED prior to admission.  Rx / DC Orders ED Discharge Orders     None         Amariyah Bazar, MD 10/10/22 [phone]

## 2022-10-10 NOTE — ED Notes (Signed)
PT educated on need for NG placement and verbalized complete understanding, denies questions at this time. Pt states he has never been able to tolerated NG tube placement and requested a few minutes to allow his pain medication to become therapeutic.

## 2022-10-10 NOTE — Consult Note (Signed)
Reason for Consult: Vomiting Referring Physician: Dr. Anibal Henderson Shane Rafter. is an 71 y.o. male.  HPI: The patient is a 71 year old black male who has a history of rectal cancer that was resected about 2 years ago.  He has a left sided colostomy.  He was in the hospital last week for a small bowel obstruction.  He improved and was tolerating a diet and was discharged home on Wednesday.  Yesterday he started developing abdominal distention and pain again.  He had several episodes of nausea and vomiting.  He returned to the hospital where a CT scan shows a persistent small bowel obstruction.  There is a transition point in the pelvis where there is a mass that is ill-defined at this point.  It has been present on previous scans.  Past Medical History:  Diagnosis Date   Allergy    Arthritis    knee   Rectal cancer ([place]) 08/2020    Past Surgical History:  Procedure Laterality Date   ESOPHAGOGASTRODUODENOSCOPY (EGD) WITH PROPOFOL N/A 03/14/2021   Procedure: ESOPHAGOGASTRODUODENOSCOPY (EGD) WITH PROPOFOL;  Surgeon: Lavena Bullion, DO;  Location: [facility];  Service: Gastroenterology;  Laterality: N/A;   IR CATHETER TUBE CHANGE  07/29/2021   IR IMAGING GUIDED PORT INSERTION  07/25/2020   IR RADIOLOGIST EVAL & MGMT  04/22/2021   IR RADIOLOGIST EVAL & MGMT  05/07/2021   IR RADIOLOGIST EVAL & MGMT  07/24/2021   IR RADIOLOGIST EVAL & MGMT  09/17/2021   IR SINUS/FIST TUBE CHK-NON GI  04/10/2021   IR SINUS/FIST TUBE CHK-NON GI  04/10/2021   IR US GUIDE BX ASP/DRAIN  03/11/2021   OSTOMY N/A 03/05/2021   Procedure: OSTOMY;  Surgeon: Leighton Ruff, MD;  Location: [facility];  Service: General;  Laterality: N/A;   WISDOM TOOTH EXTRACTION     XI ROBOTIC ASSISTED LOWER ANTERIOR RESECTION N/A 03/05/2021   Procedure: XI ROBOTIC ASSISTED LOWER ANTERIOR RESECTION WITH MOBILIZATION OF THE SPLENIC FLEXURE OF THE COLON AND END COLOSTOMY;  Surgeon: Leighton Ruff, MD;  Location: [facility];  Service: General;   Laterality: N/A;  6 HOURS    Family History  Problem Relation Age of Onset   Healthy Mother    Hypertension Father    Stroke Father    Colon cancer Neg Hx    Esophageal cancer Neg Hx    Stomach cancer Neg Hx    Rectal cancer Neg Hx     Social History:  reports that he quit smoking about 44 years ago. His smoking use included cigarettes. He has a 10.00 pack-year smoking history. He has never used smokeless tobacco. He reports that he does not drink alcohol and does not use drugs.  Allergies: No Known Allergies  Medications: I have reviewed the patient's current medications.  Results for orders placed or performed during the hospital encounter of 10/10/22 (from the past 48 hour(s))  CBC with Differential     Status: Abnormal   Collection Time: 10/10/22  5:18 AM  Result Value Ref Range   WBC 8.6 4.0 - 10.5 K/uL   RBC 4.71 4.22 - 5.81 MIL/uL   Hemoglobin 14.3 13.0 - 17.0 g/dL   HCT 41.4 39.0 - 52.0 %   MCV 87.9 80.0 - 100.0 fL   MCH 30.4 26.0 - 34.0 pg   MCHC 34.5 30.0 - 36.0 g/dL   RDW 12.0 11.5 - 15.5 %   Platelets 276 150 - 400 K/uL   nRBC 0.0 0.0 - 0.2 %  Neutrophils Relative % 87 %   Neutro Abs 7.5 1.7 - 7.7 K/uL   Lymphocytes Relative 7 %   Lymphs Abs 0.6 (L) 0.7 - 4.0 K/uL   Monocytes Relative 4 %   Monocytes Absolute 0.4 0.1 - 1.0 K/uL   Eosinophils Relative 0 %   Eosinophils Absolute 0.0 0.0 - 0.5 K/uL   Basophils Relative 1 %   Basophils Absolute 0.1 0.0 - 0.1 K/uL   Immature Granulocytes 1 %   Abs Immature Granulocytes 0.04 0.00 - 0.07 K/uL    Comment: Performed at [facility], [address] [place], [place] [address]  Basic metabolic panel     Status: Abnormal   Collection Time: 10/10/22  5:18 AM  Result Value Ref Range   Sodium 139 135 - 145 mmol/L   Potassium 3.9 3.5 - 5.1 mmol/L   Chloride 101 98 - 111 mmol/L   CO2 27 22 - 32 mmol/L   Glucose, Bld 138 (H) 70 - 99 mg/dL    Comment: Glucose reference range applies only to samples  taken after fasting for at least 8 hours.   BUN 17 8 - 23 mg/dL   Creatinine, Ser 0.97 0.61 - 1.24 mg/dL   Calcium 10.2 8.9 - 10.3 mg/dL   GFR, Estimated >60 >60 mL/min    Comment: (NOTE) Calculated using the CKD-EPI Creatinine Equation (2021)    Anion gap 11 5 - 15    Comment: Performed at [facility], [place], [place] [address]  Urinalysis, Routine w reflex microscopic Urine, Unspecified Source     Status: Abnormal   Collection Time: 10/10/22 10:41 AM  Result Value Ref Range   Color, Urine YELLOW YELLOW   APPearance CLEAR CLEAR   Specific Gravity, Urine >1.046 (H) 1.005 - 1.030   pH 8.5 (H) 5.0 - 8.0   Glucose, UA NEGATIVE NEGATIVE mg/dL   Hgb urine dipstick SMALL (A) NEGATIVE   Bilirubin Urine NEGATIVE NEGATIVE   Ketones, ur 15 (A) NEGATIVE mg/dL   Protein, ur 100 (A) NEGATIVE mg/dL   Nitrite NEGATIVE NEGATIVE   Leukocytes,Ua NEGATIVE NEGATIVE   RBC / HPF 21-50 0 - 5 RBC/hpf   WBC, UA 0-5 0 - 5 WBC/hpf   Squamous Epithelial / LPF 0-5 0 - 5    Comment: Performed at [facility], [address], [place], [place] [address]    CT ABDOMEN PELVIS W CONTRAST  Result Date: 10/10/2022 CLINICAL DATA:  71 year old male with history of lower abdominal pain. History of rectal cancer status post surgical resection. * Tracking Code: BO * EXAM: CT ABDOMEN AND PELVIS WITH CONTRAST TECHNIQUE: Multidetector CT imaging of the abdomen and pelvis was performed using the standard protocol following bolus administration of intravenous contrast. RADIATION DOSE REDUCTION: This exam was performed according to the departmental dose-optimization program which includes automated exposure control, adjustment of the mA and/or kV according to patient size and/or use of iterative reconstruction technique. CONTRAST:  132m OMNIPAQUE IOHEXOL 300 MG/ML  SOLN COMPARISON:  CT of the abdomen and pelvis 10/02/2022. FINDINGS: Lower chest: Mild linear scarring in the  lower lobes of the lungs bilaterally. Hepatobiliary: No suspicious cystic or solid hepatic lesions. Minimal intrahepatic biliary ductal dilatation in the right lobe of the liver, similar to prior examinations, presumably benign. No dilatation of the common bile duct. Gallbladder is normal in appearance. Pancreas: No pancreatic mass. No pancreatic ductal dilatation. No pancreatic or peripancreatic fluid collections or inflammatory changes. Spleen: Unremarkable. Adrenals/Urinary Tract: Low-attenuation  lesions in the right kidney compatible with simple cysts, measuring up to 2.8 cm in the lower pole. Other subcentimeter low-attenuation lesions in both kidneys, too small to definitively characterize, but statistically likely to represent tiny cysts (no imaging follow-up recommended). Bilateral adrenal glands are normal in appearance. No hydroureteronephrosis. Urinary bladder is largely decompressed, but otherwise unremarkable in appearance. Stomach/Bowel: The appearance of the stomach is unremarkable. Multiple dilated loops of small bowel are again noted, with multiple air-fluid levels. The degree of small-bowel dilatation is increased compared to the prior study, with small bowel loops measuring up to 5 cm on today's examination in the upper abdomen. Terminal ileum appears decompressed. There is some gas and stool in the colon, along with retained oral contrast material. Postoperative changes of low anterior resection with left lower quadrant colostomy. Again noted is a large amount of malignant appearing soft tissue in the low anatomic pelvis extending from the site of the resected rectal mass into the presacral space. This is irregular in shape and therefore difficult to accurately measure, but is estimated to be approximately 6.8 x 4.9 x 8.0 cm (axial image 76 of series 2 and sagittal image 55 of series 6) on today's examination, with a centrally necrotic region which demonstrates both internal fat and fluid  density, similar to prior examinations. Multiple small bowel loops appear tethered in this region, likely from underlying adhesions. One of these loops is now severely distended (5.6 cm in diameter) and demonstrates fecalization of internal contents. Vascular/Lymphatic: Aortic atherosclerosis, without evidence of aneurysm or dissection in the abdominal or pelvic vasculature. No lymphadenopathy noted in the abdomen or pelvis. Reproductive: Prostate gland and seminal vesicles are unremarkable in appearance. Large bilateral hydroceles noted in the partially imaged scrotum. Other: No substantial volume of ascites.  No pneumoperitoneum. Musculoskeletal: There are no aggressive appearing lytic or blastic lesions noted in the visualized portions of the skeleton. IMPRESSION: 1. Persistent and worsening small-bowel obstruction presumably secondary to adhesions in the low anatomic pelvis, as above. 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 2. Postoperative changes of low anterior resection with left lower quadrant colostomy and chronic malignant appearing soft tissue in the resection bed extending into the presacral space, similar to the recent prior study. 3. Large bilateral hydroceles redemonstrated. 4. Aortic atherosclerosis. 5. Additional incidental findings, as above. Electronically Signed   By: Vinnie Langton M.D.   On: 10/10/2022 06:55    Review of Systems  Constitutional: Negative.   HENT: Negative.    Eyes: Negative.   Respiratory: Negative.    Cardiovascular: Negative.   Gastrointestinal:  Positive for abdominal distention, abdominal pain, nausea and vomiting.  Endocrine: Negative.   Genitourinary: Negative.   Musculoskeletal: Negative.   Skin: Negative.   Allergic/Immunologic: Negative.   Neurological: Negative.   Hematological: Negative.   Psychiatric/Behavioral: Negative.      Blood pressure (!) 149/75, pulse 93, temperature 99.4 F (37.4 C), temperature source Skin, resp. rate 17, height '5\' 10"'$  (1.778 m), weight 86.2 kg, SpO2 99 %. Physical Exam Vitals reviewed.  Constitutional:      General: He is not in acute distress.    Appearance: Normal appearance.  HENT:     Head: Normocephalic and atraumatic.     Right Ear: External ear normal.     Left Ear: External ear normal.     Nose: Nose normal.     Mouth/Throat:  Mouth: Mucous membranes are moist.     Pharynx: Oropharynx is clear.  Eyes:     Extraocular Movements: Extraocular movements intact.     Conjunctiva/sclera: Conjunctivae normal.     Pupils: Pupils are equal, round, and reactive to light.  Cardiovascular:     Rate and Rhythm: Normal rate and regular rhythm.     Pulses: Normal pulses.     Heart sounds: Normal heart sounds.  Pulmonary:     Effort: Pulmonary effort is normal. No respiratory distress.     Breath sounds: Normal breath sounds.  Abdominal:     Comments: The abdomen is moderately distended with mild tenderness.  The ostomy is pink with a small amount of output in the bag  Musculoskeletal:        General: No swelling or deformity. Normal range of motion.     Cervical back: Normal range of motion and neck supple.  Skin:    General: Skin is warm and dry.     Coloration: Skin is not jaundiced.  Neurological:     General: No focal deficit present.     Mental Status: He is alert and oriented to person, place, and time.  Psychiatric:        Mood and Affect: Mood normal.        Behavior: Behavior normal.     Assessment/Plan: The patient appears to have a small bowel obstruction which could be due to scar tissue versus recurrent tumor.  We would like to get the opinion of the oncologist about the mass in the pelvis.  We would recommend placing an NG tube for bowel rest and decompression.  Once the NG is in place we can start the small bowel protocol.  We will follow him closely  with you.  Autumn Messing III 10/10/2022, 6:04 PM

## 2022-10-10 NOTE — ED Notes (Signed)
Pt called out and said that his pain has returned to "15/10" and that he is also experiencing leg cramps - states he feels like he might be dehydrated from all the emesis. Pt appears slight diaphoretic and restless at this time.

## 2022-10-10 NOTE — ED Notes (Signed)
This nurse and Micah, RN attempted NG tube. Pt reports that NG tubes have "never" been successful, except under sedation/anesthesia. Pt did agree to try after the morphine helped his pain level. Pt was unable to tolerate NG tube insertion and refused further attempts. Pt alert and comfortable (pain "down to 10/10 from 15/10") after stopping insertion.

## 2022-10-10 NOTE — ED Notes (Signed)
Meds given, called CT to notify of pt getting pain meds so he can be scanned, lab called to start processing blood work.

## 2022-10-10 NOTE — Plan of Care (Signed)
  Problem: Education: Goal: Knowledge of General Education information will improve Description: Including pain rating scale, medication(s)/side effects and non-pharmacologic comfort measures Outcome: Progressing   Problem: Activity: Goal: Risk for activity intolerance will decrease Outcome: Progressing   Problem: Pain Managment: Goal: General experience of comfort will improve Outcome: Progressing   

## 2022-10-10 NOTE — Progress Notes (Signed)
Patient being medicated as needed for pain and nausea, this writer has not been made aware of any additional plans for NG insertion (patient had undergone 5 unsuccessful attempts, per the offgoing shift nurse, dayshift hospitalist made aware and was going to have discussion with the surgeon regarding the next steps). Will continue to monitor and medicate as needed.

## 2022-10-10 NOTE — ED Triage Notes (Signed)
Pt has colostomy and hx of colon/rectal cancer.

## 2022-10-11 DIAGNOSIS — K5669 Other partial intestinal obstruction: Secondary | ICD-10-CM | POA: Diagnosis not present

## 2022-10-11 DIAGNOSIS — K56609 Unspecified intestinal obstruction, unspecified as to partial versus complete obstruction: Secondary | ICD-10-CM | POA: Diagnosis not present

## 2022-10-11 LAB — COMPREHENSIVE METABOLIC PANEL
ALT: 6 U/L (ref 0–44)
AST: 14 U/L — ABNORMAL LOW (ref 15–41)
Albumin: 3 g/dL — ABNORMAL LOW (ref 3.5–5.0)
Alkaline Phosphatase: 58 U/L (ref 38–126)
Anion gap: 3 — ABNORMAL LOW (ref 5–15)
BUN: 22 mg/dL (ref 8–23)
CO2: 26 mmol/L (ref 22–32)
Calcium: 8.6 mg/dL — ABNORMAL LOW (ref 8.9–10.3)
Chloride: 113 mmol/L — ABNORMAL HIGH (ref 98–111)
Creatinine, Ser: 1.07 mg/dL (ref 0.61–1.24)
GFR, Estimated: >60 mL/min (ref >60)
Glucose, Bld: 97 mg/dL (ref 70–99)
Potassium: 4.5 mmol/L (ref 3.5–5.1)
Sodium: 142 mmol/L (ref 135–145)
Total Bilirubin: 0.9 mg/dL (ref 0.3–1.2)
Total Protein: 6.3 g/dL — ABNORMAL LOW (ref 6.5–8.1)

## 2022-10-11 LAB — MAGNESIUM: Magnesium: 2.1 mg/dL (ref 1.7–2.4)

## 2022-10-11 LAB — CBC
HCT: 33.8 % — ABNORMAL LOW (ref 39.0–52.0)
Hemoglobin: 11.5 g/dL — ABNORMAL LOW (ref 13.0–17.0)
MCH: 30.4 pg (ref 26.0–34.0)
MCHC: 34 g/dL (ref 30.0–36.0)
MCV: 89.4 fL (ref 80.0–100.0)
Platelets: 228 10*3/uL (ref 150–400)
RBC: 3.78 MIL/uL — ABNORMAL LOW (ref 4.22–5.81)
RDW: 12.2 % (ref 11.5–15.5)
WBC: 6.7 10*3/uL (ref 4.0–10.5)
nRBC: 0 % (ref 0.0–0.2)

## 2022-10-11 LAB — PHOSPHORUS: Phosphorus: 2.5 mg/dL (ref 2.5–4.6)

## 2022-10-11 MED ORDER — SODIUM CHLORIDE 0.9 % IV SOLN: INTRAVENOUS | Status: DC

## 2022-10-11 NOTE — Progress Notes (Signed)
Subjective/Chief Complaint: Reports no further nausea and less abdominal pain Had large amount of ostomy output   Objective: Vital signs in last 24 hours: Temp:  [98.2 F (36.8 C)-99.4 F (37.4 C)] 99.2 F (37.3 C) (12/03 0605) Pulse Rate:  [80-105] 80 (12/03 0605) Resp:  [16-19] 19 (12/03 0605) BP: (120-179)/(54-88) 120/54 (12/03 0605) SpO2:  [96 %-100 %] 99 % (12/03 0605) Last BM Date : 10/11/22 (colostomy output)  Intake/Output from previous day: 12/02 0701 - 12/03 0700 In: 3200.5 [I.V.:1180.8; IV Piggyback:1019.7] Out: 800 [Urine:200; Stool:600] Intake/Output this shift: Total I/O In: 0  Out: 175 [Urine:175]  Exam: Awake and alert Abdomen soft, non-tender, large amount of liquid stool in ostomy bag  Lab Results:  Recent Labs    10/10/22 0518 10/11/22 0230  WBC 8.6 6.7  HGB 14.3 11.5*  HCT 41.4 33.8*  PLT 276 228   BMET Recent Labs    10/10/22 0518 10/11/22 0230  NA 139 142  K 3.9 4.5  CL 101 113*  CO2 27 26  GLUCOSE 138* 97  BUN 17 22  CREATININE 0.97 1.07  CALCIUM 10.2 8.6*   PT/INR No results for input(s): "LABPROT", "INR" in the last 72 hours. ABG No results for input(s): "PHART", "HCO3" in the last 72 hours.  Invalid input(s): "PCO2", "PO2"  Studies/Results: CT ABDOMEN PELVIS W CONTRAST  Result Date: 10/10/2022 CLINICAL DATA:  71 year old male with history of lower abdominal pain. History of rectal cancer status post surgical resection. * Tracking Code: BO * EXAM: CT ABDOMEN AND PELVIS WITH CONTRAST TECHNIQUE: Multidetector CT imaging of the abdomen and pelvis was performed using the standard protocol following bolus administration of intravenous contrast. RADIATION DOSE REDUCTION: This exam was performed according to the departmental dose-optimization program which includes automated exposure control, adjustment of the mA and/or kV according to patient size and/or use of iterative reconstruction technique. CONTRAST:  128m OMNIPAQUE  IOHEXOL 300 MG/ML  SOLN COMPARISON:  CT of the abdomen and pelvis 10/02/2022. FINDINGS: Lower chest: Mild linear scarring in the lower lobes of the lungs bilaterally. Hepatobiliary: No suspicious cystic or solid hepatic lesions. Minimal intrahepatic biliary ductal dilatation in the right lobe of the liver, similar to prior examinations, presumably benign. No dilatation of the common bile duct. Gallbladder is normal in appearance. Pancreas: No pancreatic mass. No pancreatic ductal dilatation. No pancreatic or peripancreatic fluid collections or inflammatory changes. Spleen: Unremarkable. Adrenals/Urinary Tract: Low-attenuation lesions in the right kidney compatible with simple cysts, measuring up to 2.8 cm in the lower pole. Other subcentimeter low-attenuation lesions in both kidneys, too small to definitively characterize, but statistically likely to represent tiny cysts (no imaging follow-up recommended). Bilateral adrenal glands are normal in appearance. No hydroureteronephrosis. Urinary bladder is largely decompressed, but otherwise unremarkable in appearance. Stomach/Bowel: The appearance of the stomach is unremarkable. Multiple dilated loops of small bowel are again noted, with multiple air-fluid levels. The degree of small-bowel dilatation is increased compared to the prior study, with small bowel loops measuring up to 5 cm on today's examination in the upper abdomen. Terminal ileum appears decompressed. There is some gas and stool in the colon, along with retained oral contrast material. Postoperative changes of low anterior resection with left lower quadrant colostomy. Again noted is a large amount of malignant appearing soft tissue in the low anatomic pelvis extending from the site of the resected rectal mass into the presacral space. This is irregular in shape and therefore difficult to accurately measure, but is estimated to be approximately  6.8 x 4.9 x 8.0 cm (axial image 76 of series 2 and sagittal  image 55 of series 6) on today's examination, with a centrally necrotic region which demonstrates both internal fat and fluid density, similar to prior examinations. Multiple small bowel loops appear tethered in this region, likely from underlying adhesions. One of these loops is now severely distended (5.6 cm in diameter) and demonstrates fecalization of internal contents. Vascular/Lymphatic: Aortic atherosclerosis, without evidence of aneurysm or dissection in the abdominal or pelvic vasculature. No lymphadenopathy noted in the abdomen or pelvis. Reproductive: Prostate gland and seminal vesicles are unremarkable in appearance. Large bilateral hydroceles noted in the partially imaged scrotum. Other: No substantial volume of ascites.  No pneumoperitoneum. Musculoskeletal: There are no aggressive appearing lytic or blastic lesions noted in the visualized portions of the skeleton. IMPRESSION: 1. Persistent and worsening small-bowel obstruction presumably secondary to adhesions in the low anatomic pelvis, as above. 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 2. Postoperative changes of low anterior resection with left lower quadrant colostomy and chronic malignant appearing soft tissue in the resection bed extending into the presacral space, similar to the recent prior study. 3. Large bilateral hydroceles redemonstrated. 4. Aortic atherosclerosis. 5. Additional incidental findings, as above. Electronically Signed   By: Vinnie Langton M.D.   On: 10/10/2022 06:55    Anti-infectives: Anti-infectives (From admission, onward)    None       Assessment/Plan: SBO from adhesions vs recurrent tumor  Will let him have liquids today as he is improved. Given CT findings, I suspect he will continue to have obstructive issues without surgery.  Scan is worrisome that this may be recurrent tumor. May  need input from one of our colorectal surgeons this week and from oncology.  Dr. Benay Spice saw him 12/1 and does not believe he has recurrent cancer.  LOS: 1 day    Coralie Keens MD 10/11/2022

## 2022-10-11 NOTE — Plan of Care (Signed)
  Problem: Coping: Goal: Level of anxiety will decrease Outcome: Progressing   Problem: Pain Managment: Goal: General experience of comfort will improve Outcome: Progressing   

## 2022-10-11 NOTE — Plan of Care (Signed)

## 2022-10-11 NOTE — Plan of Care (Signed)
  Problem: Coping: Goal: Level of anxiety will decrease Outcome: Progressing   Problem: Pain Managment: Goal: General experience of comfort will improve Outcome: Progressing   Problem: Safety: Goal: Ability to remain free from injury will improve Outcome: Progressing   

## 2022-10-11 NOTE — Progress Notes (Signed)
PROGRESS NOTE    Shane [name].  MHD:[id number]  DOB: 1950-12-07  DOA: 10/10/2022 PCP: Marrian Salvage, FNP Outpatient Specialists:   Hospital course:  71 year old man with rectal CA s/p partial colectomy with colostomy, history of SBO/ileus was admitted yesterday for recurrent SBO.  He does have a transition point thought to be secondary to likely ideations.  Patient was seen by general surgery who recommended conservative management.  Of note, NG tube was unable to be placed by ED or nursing staff upstairs.   Subjective:  Patient states he does feel better.  No further nausea or vomiting.  Notes that he is beginning to have ostomy output again.   Objective: Vitals:   10/10/22 1757 10/10/22 2148 10/11/22 0100 10/11/22 0605  BP: (!) 149/75 (!) 141/69 (!) 122/57 (!) 120/54  Pulse: 93 82 81 80  Resp: '17 16 18 19  '$ Temp: 99.4 F (37.4 C) 98.2 F (36.8 C) 99 F (37.2 C) 99.2 F (37.3 C)  TempSrc: Skin Oral Oral Oral  SpO2: 99% 99% 99% 99%  Weight:      Height:        Intake/Output Summary (Last 24 hours) at 10/11/2022 1120 Last data filed at 10/11/2022 1000 Gross per 24 hour  Intake 2986.54 ml  Output 975 ml  Net 2011.54 ml   Filed Weights   10/10/22 0515  Weight: 86.2 kg     Exam:  General: Chronically ill-appearing patient lying in bed in NAD HEENT: sclera anicteric, conjuctiva mild injection bilaterally, patient does have feculent breath CVS: S1-S2, regular  Respiratory:  decreased air entry bilaterally secondary to decreased inspiratory effort, rales at bases  GI: NABS, soft, NT, patient does have output in ostomy bag LE: No edema.  Neuro: A/O x 3, Moving all extremities equally with normal strength, CN 3-12 intact, grossly nonfocal.  Psych: patient is logical and coherent, judgement and insight appear normal, mood and affect appropriate to situation.   Assessment & Plan:   SBO Appears improved with current conservative management  despite no placement of NGT Seen by general surgery today who are recommending advancement to clear liquid diet  Abnormal abdominal CT General surgeons are concerned that mass seen on CT may be malignancy Of note, patient is followed by Dr. Benay Spice who he saw 3 days ago who believes him to be in remission.  General surgery is recommending consultation with oncology for consideration of possible resection of mass.  Would recommend contacting Dr. Learta Codding in the morning to see how he would like to proceed with this recommendation since he just saw the patient 3 days ago.    DVT prophylaxis: Lovenox Code Status: Full Family Communication: None today Disposition Plan:   Patient is from: Home  Anticipated Discharge Location: Home  Barriers to Discharge: SBO  Is patient medically stable for Discharge: No   Scheduled Meds:  Chlorhexidine Gluconate Cloth  6 each Topical Daily   enoxaparin (LOVENOX) injection  40 mg Subcutaneous Q24H   Continuous Infusions:  0.9 % NaCl with KCl 20 mEq / L 100 mL/hr at 10/11/22 0914    Data Reviewed:  Basic Metabolic Panel: Recent Labs  Lab 10/07/22 0306 10/10/22 0518 10/11/22 0230  NA 136 139 142  K 3.7 3.9 4.5  CL 105 101 113*  CO2 '25 27 26  '$ GLUCOSE 91 138* 97  BUN '13 17 22  '$ CREATININE 1.00 0.97 1.07  CALCIUM 9.0 10.2 8.6*  MG  --   --  2.1  PHOS  --   --  2.5    CBC: Recent Labs  Lab 10/07/22 0306 10/10/22 0518 10/11/22 0230  WBC 4.3 8.6 6.7  NEUTROABS  --  7.5  --   HGB 12.5* 14.3 11.5*  HCT 35.1* 41.4 33.8*  MCV 87.1 87.9 89.4  PLT 198 276 228    Studies: CT ABDOMEN PELVIS W CONTRAST  Result Date: 10/10/2022 CLINICAL DATA:  71 year old male with history of lower abdominal pain. History of rectal cancer status post surgical resection. * Tracking Code: BO * EXAM: CT ABDOMEN AND PELVIS WITH CONTRAST TECHNIQUE: Multidetector CT imaging of the abdomen and pelvis was performed using the standard protocol following bolus  administration of intravenous contrast. RADIATION DOSE REDUCTION: This exam was performed according to the departmental dose-optimization program which includes automated exposure control, adjustment of the mA and/or kV according to patient size and/or use of iterative reconstruction technique. CONTRAST:  185m OMNIPAQUE IOHEXOL 300 MG/ML  SOLN COMPARISON:  CT of the abdomen and pelvis 10/02/2022. FINDINGS: Lower chest: Mild linear scarring in the lower lobes of the lungs bilaterally. Hepatobiliary: No suspicious cystic or solid hepatic lesions. Minimal intrahepatic biliary ductal dilatation in the right lobe of the liver, similar to prior examinations, presumably benign. No dilatation of the common bile duct. Gallbladder is normal in appearance. Pancreas: No pancreatic mass. No pancreatic ductal dilatation. No pancreatic or peripancreatic fluid collections or inflammatory changes. Spleen: Unremarkable. Adrenals/Urinary Tract: Low-attenuation lesions in the right kidney compatible with simple cysts, measuring up to 2.8 cm in the lower pole. Other subcentimeter low-attenuation lesions in both kidneys, too small to definitively characterize, but statistically likely to represent tiny cysts (no imaging follow-up recommended). Bilateral adrenal glands are normal in appearance. No hydroureteronephrosis. Urinary bladder is largely decompressed, but otherwise unremarkable in appearance. Stomach/Bowel: The appearance of the stomach is unremarkable. Multiple dilated loops of small bowel are again noted, with multiple air-fluid levels. The degree of small-bowel dilatation is increased compared to the prior study, with small bowel loops measuring up to 5 cm on today's examination in the upper abdomen. Terminal ileum appears decompressed. There is some gas and stool in the colon, along with retained oral contrast material. Postoperative changes of low anterior resection with left lower quadrant colostomy. Again noted is a large  amount of malignant appearing soft tissue in the low anatomic pelvis extending from the site of the resected rectal mass into the presacral space. This is irregular in shape and therefore difficult to accurately measure, but is estimated to be approximately 6.8 x 4.9 x 8.0 cm (axial image 76 of series 2 and sagittal image 55 of series 6) on today's examination, with a centrally necrotic region which demonstrates both internal fat and fluid density, similar to prior examinations. Multiple small bowel loops appear tethered in this region, likely from underlying adhesions. One of these loops is now severely distended (5.6 cm in diameter) and demonstrates fecalization of internal contents. Vascular/Lymphatic: Aortic atherosclerosis, without evidence of aneurysm or dissection in the abdominal or pelvic vasculature. No lymphadenopathy noted in the abdomen or pelvis. Reproductive: Prostate gland and seminal vesicles are unremarkable in appearance. Large bilateral hydroceles noted in the partially imaged scrotum. Other: No substantial volume of ascites.  No pneumoperitoneum. Musculoskeletal: There are no aggressive appearing lytic or blastic lesions noted in the visualized portions of the skeleton. IMPRESSION: 1. Persistent and worsening small-bowel obstruction presumably secondary to adhesions in the low anatomic pelvis, as above. 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 2. Postoperative changes of low anterior resection with left lower quadrant colostomy and chronic malignant appearing soft tissue in the resection bed extending into the presacral space, similar to the recent prior study. 3. Large bilateral hydroceles redemonstrated. 4. Aortic atherosclerosis. 5. Additional incidental findings, as above. Electronically Signed   By: Vinnie Langton M.D.   On: 10/10/2022 06:55    Principal Problem:    Small bowel obstruction ([place])     Dewaine Oats Derek Jack, [facility]  If 7PM-7AM, please contact night-coverage [email or web address]   LOS: 1 day

## 2022-10-12 DIAGNOSIS — K56609 Unspecified intestinal obstruction, unspecified as to partial versus complete obstruction: Secondary | ICD-10-CM | POA: Diagnosis not present

## 2022-10-12 DIAGNOSIS — K5669 Other partial intestinal obstruction: Secondary | ICD-10-CM | POA: Diagnosis not present

## 2022-10-12 LAB — COMPREHENSIVE METABOLIC PANEL
ALT: 14 U/L (ref 0–44)
AST: 13 U/L — ABNORMAL LOW (ref 15–41)
Albumin: 3.3 g/dL — ABNORMAL LOW (ref 3.5–5.0)
Alkaline Phosphatase: 58 U/L (ref 38–126)
Anion gap: 3 — ABNORMAL LOW (ref 5–15)
BUN: 13 mg/dL (ref 8–23)
CO2: 23 mmol/L (ref 22–32)
Calcium: 8.7 mg/dL — ABNORMAL LOW (ref 8.9–10.3)
Chloride: 111 mmol/L (ref 98–111)
Creatinine, Ser: 0.95 mg/dL (ref 0.61–1.24)
GFR, Estimated: >60 mL/min (ref >60)
Glucose, Bld: 87 mg/dL (ref 70–99)
Potassium: 3.9 mmol/L (ref 3.5–5.1)
Sodium: 137 mmol/L (ref 135–145)
Total Bilirubin: 0.8 mg/dL (ref 0.3–1.2)
Total Protein: 6.4 g/dL — ABNORMAL LOW (ref 6.5–8.1)

## 2022-10-12 LAB — CBC
HCT: 31.6 % — ABNORMAL LOW (ref 39.0–52.0)
Hemoglobin: 10.6 g/dL — ABNORMAL LOW (ref 13.0–17.0)
MCH: 30.2 pg (ref 26.0–34.0)
MCHC: 33.5 g/dL (ref 30.0–36.0)
MCV: 90 fL (ref 80.0–100.0)
Platelets: 185 10*3/uL (ref 150–400)
RBC: 3.51 MIL/uL — ABNORMAL LOW (ref 4.22–5.81)
RDW: 12.2 % (ref 11.5–15.5)
WBC: 4.6 10*3/uL (ref 4.0–10.5)
nRBC: 0 % (ref 0.0–0.2)

## 2022-10-12 MED ORDER — BOOST / RESOURCE BREEZE PO LIQD CUSTOM
1.0000 | Freq: Three times a day (TID) | ORAL | Status: DC
  Administered 2022-10-12 – 2022-10-13 (×3): 1 via ORAL

## 2022-10-12 NOTE — Hospital Course (Signed)
PMH of rectal cancer SP partial colectomy and colostomy, recurrent SBO presented to hospital with complaints of abdominal pain and nausea and vomiting found to have small bowel obstruction.  Treated conservatively with help of general surgery.  Currently tolerating liquid diet.

## 2022-10-12 NOTE — Progress Notes (Signed)
Patient sleeping soundly, no acute distress, will continue to monitor.

## 2022-10-12 NOTE — Plan of Care (Signed)
  Problem: Coping: Goal: Level of anxiety will decrease 10/12/2022 0007 by Blase Mess, RN Outcome: Progressing 10/11/2022 2358 by Blase Mess, RN Outcome: Progressing   Problem: Pain Managment: Goal: General experience of comfort will improve 10/12/2022 0007 by Blase Mess, RN Outcome: Progressing 10/11/2022 2358 by Blase Mess, RN Outcome: Progressing

## 2022-10-12 NOTE — TOC CM/SW Note (Signed)
Transition of Care [facility]) Screening Note  Patient Details  Name: Shane [name]. Date of Birth: 1950-11-21  Transition of Care [facility]) CM/SW Contact:    Sherie Don, LCSW Phone Number: 10/12/2022, 11:56 AM  Transition of Care Department [facility]) has reviewed patient and no TOC needs have been identified at this time. We will continue to monitor patient advancement through interdisciplinary progression rounds. If new patient transition needs arise, please place a TOC consult.

## 2022-10-12 NOTE — Progress Notes (Signed)
   Subjective/Chief Complaint: Symptoms remain resolved - no nausea/emesis, abdominal pain, bloating this am. Having colostomy output   Objective: Vital signs in last 24 hours: Temp:  [98.3 F (36.8 C)-98.6 F (37 C)] 98.3 F (36.8 C) (12/04 0543) Pulse Rate:  [65-79] 65 (12/04 0543) Resp:  [16-17] 17 (12/04 0543) BP: (120-146)/(63-71) 120/63 (12/04 0543) SpO2:  [98 %-99 %] 98 % (12/04 0543) Last BM Date : 10/12/22  Intake/Output from previous day: 12/03 0701 - 12/04 0700 In: 3575 [P.O.:1320; I.V.:2255] Out: 1650 [Urine:1500; Stool:150] Intake/Output this shift: No intake/output data recorded.  Exam: Awake and alert Abdomen soft, non-tender, large amount of liquid stool in ostomy bag  Lab Results:  Recent Labs    10/11/22 0230 10/12/22 0205  WBC 6.7 4.6  HGB 11.5* 10.6*  HCT 33.8* 31.6*  PLT 228 185    BMET Recent Labs    10/11/22 0230 10/12/22 0205  NA 142 137  K 4.5 3.9  CL 113* 111  CO2 26 23  GLUCOSE 97 87  BUN 22 13  CREATININE 1.07 0.95  CALCIUM 8.6* 8.7*    PT/INR No results for input(s): "LABPROT", "INR" in the last 72 hours. ABG No results for input(s): "PHART", "HCO3" in the last 72 hours.  Invalid input(s): "PCO2", "PO2"  Studies/Results: No results found.  Anti-infectives: Anti-infectives (From admission, onward)    None       Assessment/Plan: Recurrent SBO from adhesions vs recurrent tumor - symptomatically resolved without NGT and tolerating clears. Advance to FLD and add breeze - CT findings raise concern that he will have ongoing obstructive issues and for recurrent tumor. He was evaluated by heme/onc Dr. Benay Spice 12/1 who was not concerned about recurrence at that time.  - will review with colorectal surgery - discussed with patient the possibility of OR intervention this admission vs conservative management with plans for FLD more long term  FEN: FLD, breeze ID: none VTE: [email or web address]   LOS: 2 days   Winferd Humphrey,  [facility] Surgery 10/12/2022, 8:08 AM Please see Amion for pager number during day hours 7:00am-4:30pm

## 2022-10-12 NOTE — Progress Notes (Signed)
[facility] Progress Note Patient: Shane [name]. CHY:[id number] DOB: Apr 20, 1951 DOA: 10/10/2022  DOS: the patient was seen and examined on 10/12/2022  Brief hospital course: PMH of rectal cancer SP partial colectomy and colostomy, recurrent SBO presented to hospital with complaints of abdominal pain and nausea and vomiting found to have small bowel obstruction.  Treated conservatively with help of general surgery.  Currently tolerating liquid diet. Assessment and Plan: Small bowel obstruction. Treated conservatively. Did not require NG tube placement. Treated with IV fluids. Surgery advance the diet to full liquid diet. We will monitor.  Rectal cancer. Colostomy. CT scan showing mass around the colostomy site. Recently saw Dr. Benay Spice.  CEA level was normal.  Thus patient was felt to be in remission. General surgery is concerned with regarding the findings of the CT scan and recommend surgery. We will await final recommendation.   Subjective: No nausea no vomiting tolerating oral diet.  No fever no chills.  Frequent output from the colostomy bag.  Physical Exam: General: in mild distress;  Cardiovascular: S1 and S2 Present, no Murmur Respiratory: Normal respiratory effort, Bilateral Air entry present, no Crackles, no wheezes Abdomen: Bowel Sound present, Non tender  Extremities: no edema Neurology: alert and oriented to time, place, and person   Data Reviewed: I have Reviewed nursing notes, Vitals, and Lab results. Since last encounter, pertinent lab results CBC and BMP   . I have ordered test including CBC and BMP  .   Disposition: Status is: Inpatient Remains inpatient appropriate because: Awaiting final recommendation from surgery.  enoxaparin (LOVENOX) injection 40 mg Start: 10/10/22 2200   Family Communication: No one at bedside Level of care: Med-Surg  Vitals:   10/11/22 0100 10/11/22 0605 10/11/22 2236 10/12/22 0543  BP: (!) 122/57 (!) 120/54 (!)  146/71 120/63  Pulse: 81 80 79 65  Resp: '18 19 16 17  '$ Temp: 99 F (37.2 C) 99.2 F (37.3 C) 98.6 F (37 C) 98.3 F (36.8 C)  TempSrc: Oral Oral Oral Oral  SpO2: 99% 99% 99% 98%  Weight:      Height:         Author: Berle Mull, MD 10/12/2022  Please look on [email or web address] to find out who is on call.

## 2022-10-13 DIAGNOSIS — K56609 Unspecified intestinal obstruction, unspecified as to partial versus complete obstruction: Secondary | ICD-10-CM | POA: Diagnosis not present

## 2022-10-13 MED ORDER — HEPARIN SOD (PORK) LOCK FLUSH 100 UNIT/ML IV SOLN
500.0000 [IU] | INTRAVENOUS | Status: AC | PRN
  Administered 2022-10-13: 500 [IU]

## 2022-10-13 MED ORDER — BOOST BREEZE PO LIQD: 1.0000 | Freq: Three times a day (TID) | ORAL | 0 refills | Status: DC

## 2022-10-13 MED ORDER — ONDANSETRON HCL 4 MG PO TABS: 4.0000 mg | ORAL_TABLET | Freq: Four times a day (QID) | ORAL | 0 refills | Status: DC | PRN

## 2022-10-13 MED ORDER — ACETAMINOPHEN 325 MG PO TABS: 650.0000 mg | ORAL_TABLET | Freq: Four times a day (QID) | ORAL | Status: AC | PRN

## 2022-10-13 MED ORDER — DICLOFENAC SODIUM 1 % EX GEL: 2.0000 g | Freq: Four times a day (QID) | CUTANEOUS | 0 refills | Status: AC

## 2022-10-13 NOTE — Plan of Care (Signed)

## 2022-10-13 NOTE — Progress Notes (Signed)
   Subjective/Chief Complaint: Tolerating FLD without symptoms and having good colostomy output   Objective: Vital signs in last 24 hours: Temp:  [97.8 F (36.6 C)-98.6 F (37 C)] 97.8 F (36.6 C) (12/05 0530) Pulse Rate:  [59-69] 59 (12/05 0530) Resp:  [16-18] 16 (12/05 0530) BP: (134-149)/(70-72) 134/70 (12/05 0530) SpO2:  [97 %] 97 % (12/05 0530) Last BM Date : 10/12/22  Intake/Output from previous day: 12/04 0701 - 12/05 0700 In: 2302.2 [P.O.:480; I.V.:1822.2] Out: 2875 [Urine:2775; Stool:100] Intake/Output this shift: No intake/output data recorded.  Exam: Awake and alert Abdomen soft, non-tender, large amount of liquid stool in ostomy bag  Lab Results:  Recent Labs    10/11/22 0230 10/12/22 0205  WBC 6.7 4.6  HGB 11.5* 10.6*  HCT 33.8* 31.6*  PLT 228 185    BMET Recent Labs    10/11/22 0230 10/12/22 0205  NA 142 137  K 4.5 3.9  CL 113* 111  CO2 26 23  GLUCOSE 97 87  BUN 22 13  CREATININE 1.07 0.95  CALCIUM 8.6* 8.7*    PT/INR No results for input(s): "LABPROT", "INR" in the last 72 hours. ABG No results for input(s): "PHART", "HCO3" in the last 72 hours.  Invalid input(s): "PCO2", "PO2"  Studies/Results: No results found.  Anti-infectives: Anti-infectives (From admission, onward)    None       Assessment/Plan: Recurrent SBO from adhesions vs recurrent tumor - symptomatically resolved without NGT and tolerating FLD and breeze (does not tolerate ensures well but can drink premier protein beverages) - CT findings raise concern that he will have ongoing obstructive issues and for recurrent tumor. He was evaluated by heme/onc Dr. Benay Spice 12/1 who was not concerned about recurrence at that time.  - management options discussed with patient and Dr. Marcello Moores. Plan for outpatient follow up with Dr. Marcello Moores which has been scheduled to consider elective diagnostic laparoscopy. Will need to remain on FLD in the interim. FLD in dc  paperwork  Stable for discharge from gen sx perspective  FEN: FLD, breeze ID: none VTE: [email or web address]   LOS: 3 days   Winferd Humphrey, [facility] Surgery 10/13/2022, 8:47 AM Please see Amion for pager number during day hours 7:00am-4:30pm

## 2022-10-13 NOTE — Discharge Summary (Signed)
Physician Discharge Summary   Patient: Shane [name]. MRN: [id number] DOB: 06/01/1951  Admit date:     10/10/2022  Discharge date: 10/13/2022  Discharge Physician: Berle Mull  PCP: Marrian Salvage, FNP  Recommendations at discharge:  Follow up with General surgery as recommended   Follow-up Information     Leighton Ruff, MD. Go on 09/38/1829.   Specialties: General Surgery, Colon and Rectal Surgery Why: 1:40 PM. Please arrive 15 min prior to appointment time to check in. Contact information: [address] [id number] [phone]         Marrian Salvage, [place]. Schedule an appointment as soon as possible for a visit in 1 week(s).   Specialty: Internal Medicine Why: with CBC Contact information: [place] [address] [phone]                Discharge Diagnoses: Principal Problem:   Small bowel obstruction [facility])  Hospital Course: PMH of rectal cancer SP partial colectomy and colostomy, recurrent SBO presented to hospital with complaints of abdominal pain and nausea and vomiting found to have small bowel obstruction.  Treated conservatively with help of general surgery.  Currently tolerating liquid diet. Assessment and Plan  Small bowel obstruction. Treated conservatively. Did not require NG tube placement. Treated with IV fluids. Surgery advance the diet to full liquid diet. Plan is to follow up with Dr Marcello Moores to discuss whether he needs more surgical intervention or not.  For now remains on liquid diet   Rectal cancer. Colostomy. CT scan showing mass around the colostomy site. Recently saw Dr. Benay Spice.  CEA level was normal.  Thus patient was felt to be in remission. General surgery is concerned with regarding the findings of the CT scan and recommend surgery.   Consultants:  General surgery   Procedures performed:  none  DISCHARGE MEDICATION: Allergies as of 10/13/2022   No  Known Allergies      Medication List     STOP taking these medications    diclofenac 75 MG EC tablet Commonly known as: VOLTAREN   valsartan 80 MG tablet Commonly known as: DIOVAN       TAKE these medications    acetaminophen 325 MG tablet Commonly known as: TYLENOL Take 2 tablets (650 mg total) by mouth every 6 (six) hours as needed for mild pain, fever or headache (or Fever >/= 101).   diclofenac Sodium 1 % Gel Commonly known as: VOLTAREN Apply 2 g topically 4 (four) times daily.   feeding supplement (BOOST BREEZE) Liqd Take 1 each by mouth 3 (three) times daily.   furosemide 20 MG tablet Commonly known as: LASIX Take 1 tablet (20 mg total) by mouth daily as needed for fluid. What changed: reasons to take this   ondansetron 4 MG tablet Commonly known as: ZOFRAN Take 1 tablet (4 mg total) by mouth every 6 (six) hours as needed for nausea.   pravastatin 20 MG tablet Commonly known as: PRAVACHOL TAKE 1 TABLET BY MOUTH EVERY DAY What changed: when to take this       Disposition: Home Diet recommendation: Full liquid diet  Discharge Exam: Vitals:   10/11/22 2236 10/12/22 0543 10/12/22 2254 10/13/22 0530  BP: (!) 146/71 120/63 (!) 149/72 134/70  Pulse: 79 65 69 (!) 59  Resp: '16 17 18 16  '$ Temp: 98.6 F (37 C) 98.3 F (36.8 C) 98.6 F (37 C) 97.8 F (36.6 C)  TempSrc: Oral Oral Oral Oral  SpO2: 99% 98% 97% 97%  Weight:      Height:       General: Appear in no distress; no visible Abnormal Neck Mass Or lumps, Conjunctiva normal Cardiovascular: S1 and S2 Present, no Murmur, Respiratory: good respiratory effort, Bilateral Air entry present and CTA, no Crackles, no wheezes Abdomen: Bowel Sound present, Non tender  Extremities: no Pedal edema Neurology: alert and oriented to time, place, and person  [facility] Weights   10/10/22 0515  Weight: 86.2 kg   Condition at discharge: stable  The results of significant diagnostics from this hospitalization  (including imaging, microbiology, ancillary and laboratory) are listed below for reference.   Imaging Studies: CT ABDOMEN PELVIS W CONTRAST  Result Date: 10/10/2022 CLINICAL DATA:  71 year old male with history of lower abdominal pain. History of rectal cancer status post surgical resection. * Tracking Code: BO * EXAM: CT ABDOMEN AND PELVIS WITH CONTRAST TECHNIQUE: Multidetector CT imaging of the abdomen and pelvis was performed using the standard protocol following bolus administration of intravenous contrast. RADIATION DOSE REDUCTION: This exam was performed according to the departmental dose-optimization program which includes automated exposure control, adjustment of the mA and/or kV according to patient size and/or use of iterative reconstruction technique. CONTRAST:  137m OMNIPAQUE IOHEXOL 300 MG/ML  SOLN COMPARISON:  CT of the abdomen and pelvis 10/02/2022. FINDINGS: Lower chest: Mild linear scarring in the lower lobes of the lungs bilaterally. Hepatobiliary: No suspicious cystic or solid hepatic lesions. Minimal intrahepatic biliary ductal dilatation in the right lobe of the liver, similar to prior examinations, presumably benign. No dilatation of the common bile duct. Gallbladder is normal in appearance. Pancreas: No pancreatic mass. No pancreatic ductal dilatation. No pancreatic or peripancreatic fluid collections or inflammatory changes. Spleen: Unremarkable. Adrenals/Urinary Tract: Low-attenuation lesions in the right kidney compatible with simple cysts, measuring up to 2.8 cm in the lower pole. Other subcentimeter low-attenuation lesions in both kidneys, too small to definitively characterize, but statistically likely to represent tiny cysts (no imaging follow-up recommended). Bilateral adrenal glands are normal in appearance. No hydroureteronephrosis. Urinary bladder is largely decompressed, but otherwise unremarkable in appearance. Stomach/Bowel: The appearance of the stomach is unremarkable.  Multiple dilated loops of small bowel are again noted, with multiple air-fluid levels. The degree of small-bowel dilatation is increased compared to the prior study, with small bowel loops measuring up to 5 cm on today's examination in the upper abdomen. Terminal ileum appears decompressed. There is some gas and stool in the colon, along with retained oral contrast material. Postoperative changes of low anterior resection with left lower quadrant colostomy. Again noted is a large amount of malignant appearing soft tissue in the low anatomic pelvis extending from the site of the resected rectal mass into the presacral space. This is irregular in shape and therefore difficult to accurately measure, but is estimated to be approximately 6.8 x 4.9 x 8.0 cm (axial image 76 of series 2 and sagittal image 55 of series 6) on today's examination, with a centrally necrotic region which demonstrates both internal fat and fluid density, similar to prior examinations. Multiple small bowel loops appear tethered in this region, likely from underlying adhesions. One of these loops is now severely distended (5.6 cm in diameter) and demonstrates fecalization of internal contents. Vascular/Lymphatic: Aortic atherosclerosis, without evidence of aneurysm or dissection in the abdominal or pelvic vasculature. No lymphadenopathy noted in the abdomen or pelvis. Reproductive: Prostate gland and seminal vesicles are unremarkable in appearance. Large bilateral hydroceles noted in the partially imaged  scrotum. Other: No substantial volume of ascites.  No pneumoperitoneum. Musculoskeletal: There are no aggressive appearing lytic or blastic lesions noted in the visualized portions of the skeleton. IMPRESSION: 1. Persistent and worsening small-bowel obstruction presumably secondary to adhesions in the low anatomic pelvis, as above. 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 2. Postoperative changes of low anterior resection with left lower quadrant colostomy and chronic malignant appearing soft tissue in the resection bed extending into the presacral space, similar to the recent prior study. 3. Large bilateral hydroceles redemonstrated. 4. Aortic atherosclerosis. 5. Additional incidental findings, as above. Electronically Signed   By: Vinnie Langton M.D.   On: 10/10/2022 06:55   DG Abd Portable 1V  Result Date: 10/05/2022 CLINICAL DATA:  Small-bowel obstruction EXAM: PORTABLE ABDOMEN - 1 VIEW COMPARISON:  Radiograph 10/03/2022, CT 10/03/2019 FINDINGS: Persistent small bowel dilation in the lower abdomen. Contrast material retained within the colon, unchanged. IMPRESSION: Persistent small bowel dilation concerning for small bowel obstruction. Electronically Signed   By: Maurine Simmering M.D.   On: 10/05/2022 10:58   DG Abd 1 View  Result Date: 10/03/2022 CLINICAL DATA:  Small bowel obstruction. EXAM: ABDOMEN - 1 VIEW COMPARISON:  CT scan of the abdomen and pelvis October 02, 2022 FINDINGS: Contrast identified throughout the length of the colon. Continued small bowel obstruction with dilated small bowel loops. This film is not significantly different compared to yesterday scout film. Lung bases are normal. No free air, portal venous gas, or pneumatosis identified. IMPRESSION: Continued small bowel obstruction as above. Electronically Signed   By: Dorise Bullion III M.D.   On: 10/03/2022 11:55   CT ABDOMEN PELVIS W CONTRAST  Result Date: 10/02/2022 CLINICAL DATA:  Acute generalized abdominal pain, vomiting. EXAM: CT ABDOMEN AND PELVIS WITH CONTRAST TECHNIQUE: Multidetector CT imaging of the abdomen and pelvis was performed using the standard protocol following bolus administration of intravenous contrast. RADIATION DOSE REDUCTION: This exam was performed according to the departmental dose-optimization program which includes  automated exposure control, adjustment of the mA and/or kV according to patient size and/or use of iterative reconstruction technique. CONTRAST:  133m OMNIPAQUE IOHEXOL 300 MG/ML  SOLN COMPARISON:  September 25, 2022. FINDINGS: Lower chest: No acute abnormality. Hepatobiliary: No focal liver abnormality is seen. No gallstones, gallbladder wall thickening, or biliary dilatation. Pancreas: Unremarkable. No pancreatic ductal dilatation or surrounding inflammatory changes. Spleen: Normal in size without focal abnormality. Adrenals/Urinary Tract: Adrenal glands appear normal. Stable right renal cysts are noted for which no further follow-up is required. No hydronephrosis or renal obstruction is noted. Urinary bladder is unremarkable. Stomach/Bowel: The stomach is unremarkable. Colostomy is noted in left lower quadrant. There is interval development of significant small bowel dilatation, with transition zone seen in the pelvis best seen on image number 35 of series 5. This may be due to adhesion or scarring. Vascular/Lymphatic: Aortic atherosclerosis. No enlarged abdominal or pelvic lymph nodes. Reproductive: Prostate is unremarkable. Bilateral hydroceles are again noted. Other: Status post surgical resection of rectum. There is again noted large soft tissue abnormality in the presacral region measuring 7.0 x 5.9 cm. There is continued presence air and fluid collection within this area measuring 3.6 x 3.2 cm. This is slightly increased compared to prior exam. Musculoskeletal: No acute or significant osseous findings. IMPRESSION: Status post rectal resection with colostomy placement and left lower quadrant. There is interval development of significant small bowel dilatation,  with transition zone seen in the pelvis. This is highly concerning for obstruction secondary to adhesion or scarring. Continued presence of large soft tissue abnormality in the presacral region measuring 7.0 x 5.9 cm. It is uncertain if this represents  postoperative scarring or inflammation or possibly neoplasm. 3.6 x 3.2 cm air and fluid collection is noted within this area which is increased compared to prior exam slightly; this is concerning for possible abscess. Bilateral hydroceles are again noted. Aortic Atherosclerosis (ICD10-I70.0). Electronically Signed   By: Marijo Conception M.D.   On: 10/02/2022 11:41   CT CHEST ABDOMEN PELVIS W CONTRAST  Result Date: 09/26/2022 CLINICAL DATA:  Colorectal carcinoma. Surveillance exam. Chemotherapy and radiation therapy complete 2002. * Tracking Code: BO * EXAM: CT CHEST, ABDOMEN, AND PELVIS WITH CONTRAST TECHNIQUE: Multidetector CT imaging of the chest, abdomen and pelvis was performed following the standard protocol during bolus administration of intravenous contrast. RADIATION DOSE REDUCTION: This exam was performed according to the departmental dose-optimization program which includes automated exposure control, adjustment of the mA and/or kV according to patient size and/or use of iterative reconstruction technique. CONTRAST:  136m ISOVUE-300 IOPAMIDOL (ISOVUE-300) INJECTION 61% COMPARISON:  CT 09/17/2021 FINDINGS: CT CHEST FINDINGS Cardiovascular: No significant vascular findings. Normal heart size. No pericardial effusion. Port in the anterior chest wall with tip in distal SVC. Mediastinum/Nodes: LEFT lateral to the proximal esophagus at the level of the thyroid gland there is a pouch which contains food stuff measuring 20 mm by 21 mm (5/2. This is similar to comparison CT from 2021. 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 No mediastinal adenopathy.  No axillary supraclavicular adenopathy. Lungs/Pleura: Mild basilar atelectasis. No suspicious pulmonary nodules. Musculoskeletal: No aggressive osseous lesion. CT ABDOMEN AND PELVIS FINDINGS Hepatobiliary: No focal hepatic lesion. No biliary ductal dilatation. Gallbladder is normal. Common bile duct is normal. Pancreas: Pancreas is  normal. No ductal dilatation. No pancreatic inflammation. Spleen: Normal spleen Adrenals/urinary tract: Adrenal glands and kidneys are normal. The ureters and bladder normal. Stomach/Bowel: Stomach, duodenum small-bowel normal. Terminal ileum normal. Moderate volume stool in the ascending colon. Large volume stool in the transverse colon. The descending colon exits a LEFT abdominal wall ostomy without obstruction. Prior resection of the rectum. Interval removal of drainage catheter from the presacral gas and fluid collection. The gas and fluid collection in the presacral space measures 3.1 by 2.6 cm compared to 3.3 x 3.6 cm. There is increased gas within the collection. Thin enhancing rim. Overall collections very similar. There is presacral on thickening of the soft tissues presacral space. No pelvic adenopathy Vascular/Lymphatic: Abdominal aorta is normal caliber. There is no retroperitoneal or periportal lymphadenopathy. No pelvic lymphadenopathy. Reproductive: Large bilateral hydroceles unchanged from prior. The larger LEFT hydrocele measures up to 10 cm. Other: No free fluid. Musculoskeletal: No aggressive osseous lesion. IMPRESSION: Chest Impression: 1. No evidence of thoracic metastasis. 2. 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 Abdomen / Pelvis Impression: 1. No evidence of colorectal carcinoma recurrence or metastasis in the abdomen pelvis. 2. LEFT colostomy without complication. 3. Presacral well-circumscribed gas and fluid collection not changed from comparison CT 09/17/2021. 4. Large bilateral scrotal hydroceles. Electronically Signed   By: SSuzy BouchardM.D.   On: 09/26/2022 14:19    Microbiology: Results for orders placed or performed during the hospital encounter of 10/02/22  SARS Coronavirus 2 by RT PCR ([facility] *cepheid single result test* Anterior Nasal Swab  Status: None   Collection Time:  10/02/22 12:32 PM   Specimen: Anterior Nasal Swab  Result Value Ref Range Status   SARS Coronavirus 2 by RT PCR NEGATIVE NEGATIVE Final    Comment: (NOTE) SARS-CoV-2 target nucleic acids are NOT DETECTED.  The SARS-CoV-2 RNA is generally detectable in upper and lower respiratory specimens during the acute phase of infection. The lowest concentration of SARS-CoV-2 viral copies this assay can detect is 250 copies / mL. A negative result does not preclude SARS-CoV-2 infection and should not be used as the sole basis for treatment or other patient management decisions.  A negative result may occur with improper specimen collection / handling, submission of specimen other than nasopharyngeal swab, presence of viral mutation(s) within the areas targeted by this assay, and inadequate number of viral copies (<250 copies / mL). A negative result must be combined with clinical observations, patient history, and epidemiological information.  Fact Sheet for Patients:   https://www.Lavenia Stumpo.info/  Fact Sheet for Healthcare Providers: https://hall.com/  This test is not yet approved or  cleared by the [place] FDA and has been authorized for detection and/or diagnosis of SARS-CoV-2 by FDA under an Emergency Use Authorization (EUA).  This EUA will remain in effect (meaning this test can be used) for the duration of the COVID-19 declaration under Section 564(b)(1) of the Act, 21 U.S.C. section 360bbb-3(b)(1), unless the authorization is terminated or revoked sooner.  Performed at [facility], [address] [place], [place] [address]    Labs: CBC: Recent Labs  Lab 10/07/22 0306 10/10/22 0518 10/11/22 0230 10/12/22 0205  WBC 4.3 8.6 6.7 4.6  NEUTROABS  --  7.5  --   --   HGB 12.5* 14.3 11.5* 10.6*  HCT 35.1* 41.4 33.8* 31.6*  MCV 87.1 87.9 89.4 90.0  PLT 198 276 228 009   Basic Metabolic Panel: Recent Labs  Lab  10/07/22 0306 10/10/22 0518 10/11/22 0230 10/12/22 0205  NA 136 139 142 137  K 3.7 3.9 4.5 3.9  CL 105 101 113* 111  CO2 '25 27 26 23  '$ GLUCOSE 91 138* 97 87  BUN '13 17 22 13  '$ CREATININE 1.00 0.97 1.07 0.95  CALCIUM 9.0 10.2 8.6* 8.7*  MG  --   --  2.1  --   PHOS  --   --  2.5  --    Liver Function Tests: Recent Labs  Lab 10/11/22 0230 10/12/22 0205  AST 14* 13*  ALT 6 14  ALKPHOS 58 58  BILITOT 0.9 0.8  PROT 6.3* 6.4*  ALBUMIN 3.0* 3.3*   CBG: No results for input(s): "GLUCAP" in the last 168 hours.  Discharge time spent: greater than 30 minutes.  Signed: Berle Mull, MD [facility] 10/13/2022

## 2022-10-19 DIAGNOSIS — Z933 Colostomy status: Secondary | ICD-10-CM | POA: Diagnosis not present

## 2022-10-19 DIAGNOSIS — K56609 Unspecified intestinal obstruction, unspecified as to partial versus complete obstruction: Secondary | ICD-10-CM | POA: Diagnosis not present

## 2022-11-25 ENCOUNTER — Telehealth: Payer: Self-pay | Admitting: *Deleted

## 2022-11-25 NOTE — Telephone Encounter (Signed)
Called patient to inquire if he has heard from IR re: port removal. He said he has not had time to f/u on this. He has the phone # for central scheduling and will f/u in a few days.

## 2022-12-04 ENCOUNTER — Ambulatory Visit (HOSPITAL_COMMUNITY)
Admission: RE | Admit: 2022-12-04 | Discharge: 2022-12-04 | Disposition: A | Discharge status: Home / Self Care | Payer: No Typology Code available for payment source | Source: Ambulatory Visit | Attending: Oncology | Admitting: Oncology

## 2022-12-04 DIAGNOSIS — Z452 Encounter for adjustment and management of vascular access device: Secondary | ICD-10-CM | POA: Insufficient documentation

## 2022-12-04 DIAGNOSIS — Z85048 Personal history of other malignant neoplasm of rectum, rectosigmoid junction, and anus: Secondary | ICD-10-CM | POA: Insufficient documentation

## 2022-12-04 DIAGNOSIS — C20 Malignant neoplasm of rectum: Secondary | ICD-10-CM

## 2022-12-04 HISTORY — PX: IR REMOVAL TUN ACCESS W/ PORT W/O FL MOD SED: IMG2290

## 2022-12-04 MED ORDER — LIDOCAINE-EPINEPHRINE 1 %-1:100000 IJ SOLN
INTRAMUSCULAR | Status: AC
  Administered 2022-12-04 (×2): 10 mL via INTRADERMAL
  Filled 2022-12-04: qty 1

## 2022-12-04 NOTE — Procedures (Signed)
Interventional Radiology Procedure:   Indications: History of rectal cancer, port is no longer needed.  Procedure: Port removal  Findings: Complete removal of right chest port.  Complications: No immediate complications noted.     EBL: Minimal  Plan: Discharge to home.  Keep incision dry for 24 hours.    [name] R. Anselm Pancoast, MD  Pager: [phone]

## 2023-01-01 ENCOUNTER — Other Ambulatory Visit: Payer: Self-pay | Admitting: Family

## 2023-01-01 ENCOUNTER — Telehealth: Payer: Self-pay | Admitting: Family

## 2023-01-01 MED ORDER — VALSARTAN 80 MG PO TABS: 80.0000 mg | ORAL_TABLET | Freq: Every day | ORAL | 0 refills | Status: DC

## 2023-01-01 NOTE — Telephone Encounter (Signed)
Spoke with Pt, pt states he is taking his Valsartan daily.

## 2023-01-01 NOTE — Telephone Encounter (Signed)
I need clarification on if he is taking Diovan (Valsartan) 80 mg daily; we got refill request today but note from hospital said it was discontinued in December?

## 2023-01-05 DIAGNOSIS — M25561 Pain in right knee: Secondary | ICD-10-CM | POA: Diagnosis not present

## 2023-01-05 DIAGNOSIS — M170 Bilateral primary osteoarthritis of knee: Secondary | ICD-10-CM | POA: Diagnosis not present

## 2023-01-05 DIAGNOSIS — M25562 Pain in left knee: Secondary | ICD-10-CM | POA: Diagnosis not present

## 2023-01-20 DIAGNOSIS — M170 Bilateral primary osteoarthritis of knee: Secondary | ICD-10-CM | POA: Diagnosis not present

## 2023-01-27 DIAGNOSIS — M170 Bilateral primary osteoarthritis of knee: Secondary | ICD-10-CM | POA: Diagnosis not present

## 2023-02-03 DIAGNOSIS — M170 Bilateral primary osteoarthritis of knee: Secondary | ICD-10-CM | POA: Diagnosis not present

## 2023-02-16 ENCOUNTER — Telehealth: Payer: Self-pay | Admitting: Family

## 2023-02-16 NOTE — Telephone Encounter (Signed)
Contacted Hajime Brunkhorst. to schedule their annual wellness visit. Appointment made for 02/25/2023.  Verlee Rossetti; Care Guide Ambulatory Clinical Support [place] [facility] Direct Dial: [phone]

## 2023-02-25 ENCOUNTER — Ambulatory Visit: Payer: No Typology Code available for payment source

## 2023-02-25 VITALS — Ht 70.0 in | Wt 195.0 lb

## 2023-02-25 DIAGNOSIS — Z0000 Encounter for general adult medical examination without abnormal findings: Secondary | ICD-10-CM | POA: Diagnosis not present

## 2023-02-25 NOTE — Patient Instructions (Signed)
Shane [name] , Thank you for taking time to come for your Medicare Wellness Visit. I appreciate your ongoing commitment to your health goals. Please review the following plan we discussed and let me know if I can assist you in the future.   These are the goals we discussed:  Goals      Patient Stated     02/23/2022, continue exercise and strength training         This is a list of the screening recommended for you and due dates:  Health Maintenance  Topic Date Due   COVID-19 Vaccine (5 - 2023-24 season) 07/10/2022   Flu Shot  06/10/2023   Medicare Annual Wellness Visit  02/25/2024   Colon Cancer Screening  06/17/2025   DTaP/Tdap/Td vaccine (2 - Td or Tdap) 08/26/2025   Pneumonia Vaccine  Completed   Hepatitis C Screening: USPSTF Recommendation to screen - Ages 19-79 yo.  Completed   Zoster (Shingles) Vaccine  Completed   HPV Vaccine  Aged Out    Advanced directives: Discussed patient will obtain form from office  Conditions/risks identified: Keep up the good work  Next appointment: Follow up in one year for your annual wellness visit. 03/01/2024  Preventive Care 65 Years and Older, Male  Preventive care refers to lifestyle choices and visits with your health care provider that can promote health and wellness. What does preventive care include? A yearly physical exam. This is also called an annual well check. Dental exams once or twice a year. Routine eye exams. Ask your health care provider how often you should have your eyes checked. Personal lifestyle choices, including: Daily care of your teeth and gums. Regular physical activity. Eating a healthy diet. Avoiding tobacco and drug use. Limiting alcohol use. Practicing safe sex. Taking low doses of aspirin every day. Taking vitamin and mineral supplements as recommended by your health care provider. What happens during an annual well check? The services and screenings done by your health care provider during your annual  well check will depend on your age, overall health, lifestyle risk factors, and family history of disease. Counseling  Your health care provider may ask you questions about your: Alcohol use. Tobacco use. Drug use. Emotional well-being. Home and relationship well-being. Sexual activity. Eating habits. History of falls. Memory and ability to understand (cognition). Work and work Astronomer. Screening  You may have the following tests or measurements: Height, weight, and BMI. Blood pressure. Lipid and cholesterol levels. These may be checked every 5 years, or more frequently if you are over 68 years old. Skin check. Lung cancer screening. You may have this screening every year starting at age 60 if you have a 30-pack-year history of smoking and currently smoke or have quit within the past 15 years. Fecal occult blood test (FOBT) of the stool. You may have this test every year starting at age 39. Flexible sigmoidoscopy or colonoscopy. You may have a sigmoidoscopy every 5 years or a colonoscopy every 10 years starting at age 57. Prostate cancer screening. Recommendations will vary depending on your family history and other risks. Hepatitis C blood test. Hepatitis B blood test. Sexually transmitted disease (STD) testing. Diabetes screening. This is done by checking your blood sugar (glucose) after you have not eaten for a while (fasting). You may have this done every 1-3 years. Abdominal aortic aneurysm (AAA) screening. You may need this if you are a current or former smoker. Osteoporosis. You may be screened starting at age 4 if you are at  high risk. Talk with your health care provider about your test results, treatment options, and if necessary, the need for more tests. Vaccines  Your health care provider may recommend certain vaccines, such as: Influenza vaccine. This is recommended every year. Tetanus, diphtheria, and acellular pertussis (Tdap, Td) vaccine. You may need a Td booster  every 10 years. Zoster vaccine. You may need this after age 24. Pneumococcal 13-valent conjugate (PCV13) vaccine. One dose is recommended after age 33. Pneumococcal polysaccharide (PPSV23) vaccine. One dose is recommended after age 44. Talk to your health care provider about which screenings and vaccines you need and how often you need them. This information is not intended to replace advice given to you by your health care provider. Make sure you discuss any questions you have with your health care provider. Document Released: 11/22/2015 Document Revised: 07/15/2016 Document Reviewed: 08/27/2015 Elsevier Interactive Patient Education  2017 [place] Prevention in the Home Falls can cause injuries. They can happen to people of all ages. There are many things you can do to make your home safe and to help prevent falls. What can I do on the outside of my home? Regularly fix the edges of walkways and driveways and fix any cracks. Remove anything that might make you trip as you walk through a door, such as a raised step or threshold. Trim any bushes or trees on the path to your home. Use bright outdoor lighting. Clear any walking paths of anything that might make someone trip, such as rocks or tools. Regularly check to see if handrails are loose or broken. Make sure that both sides of any steps have handrails. Any raised decks and porches should have guardrails on the edges. Have any leaves, snow, or ice cleared regularly. Use sand or salt on walking paths during winter. Clean up any spills in your garage right away. This includes oil or grease spills. What can I do in the bathroom? Use night lights. Install grab bars by the toilet and in the tub and shower. Do not use towel bars as grab bars. Use non-skid mats or decals in the tub or shower. If you need to sit down in the shower, use a plastic, non-slip stool. Keep the floor dry. Clean up any water that spills on the floor as soon  as it happens. Remove soap buildup in the tub or shower regularly. Attach bath mats securely with double-sided non-slip rug tape. Do not have throw rugs and other things on the floor that can make you trip. What can I do in the bedroom? Use night lights. Make sure that you have a light by your bed that is easy to reach. Do not use any sheets or blankets that are too big for your bed. They should not hang down onto the floor. Have a firm chair that has side arms. You can use this for support while you get dressed. Do not have throw rugs and other things on the floor that can make you trip. What can I do in the kitchen? Clean up any spills right away. Avoid walking on wet floors. Keep items that you use a lot in easy-to-reach places. If you need to reach something above you, use a strong step stool that has a grab bar. Keep electrical cords out of the way. Do not use floor polish or wax that makes floors slippery. If you must use wax, use non-skid floor wax. Do not have throw rugs and other things on the floor that  can make you trip. What can I do with my stairs? Do not leave any items on the stairs. Make sure that there are handrails on both sides of the stairs and use them. Fix handrails that are broken or loose. Make sure that handrails are as long as the stairways. Check any carpeting to make sure that it is firmly attached to the stairs. Fix any carpet that is loose or worn. Avoid having throw rugs at the top or bottom of the stairs. If you do have throw rugs, attach them to the floor with carpet tape. Make sure that you have a light switch at the top of the stairs and the bottom of the stairs. If you do not have them, ask someone to add them for you. What else can I do to help prevent falls? Wear shoes that: Do not have high heels. Have rubber bottoms. Are comfortable and fit you well. Are closed at the toe. Do not wear sandals. If you use a stepladder: Make sure that it is fully  opened. Do not climb a closed stepladder. Make sure that both sides of the stepladder are locked into place. Ask someone to hold it for you, if possible. Clearly mark and make sure that you can see: Any grab bars or handrails. First and last steps. Where the edge of each step is. Use tools that help you move around (mobility aids) if they are needed. These include: Canes. Walkers. Scooters. Crutches. Turn on the lights when you go into a dark area. Replace any light bulbs as soon as they burn out. Set up your furniture so you have a clear path. Avoid moving your furniture around. If any of your floors are uneven, fix them. If there are any pets around you, be aware of where they are. Review your medicines with your doctor. Some medicines can make you feel dizzy. This can increase your chance of falling. Ask your doctor what other things that you can do to help prevent falls. This information is not intended to replace advice given to you by your health care provider. Make sure you discuss any questions you have with your health care provider. Document Released: 08/22/2009 Document Revised: 04/02/2016 Document Reviewed: 11/30/2014 Elsevier Interactive Patient Education  2017 [employer].

## 2023-02-25 NOTE — Progress Notes (Signed)
Subjective:   Shane [name]. is a 72 y.o. male who presents for Medicare Annual/Subsequent preventive examination.   I connected with  Shane [name]. on 02/25/23 by a audio enabled telemedicine application and verified that I am speaking with the correct person using two identifiers.  Patient Location: Home  Provider Location: Home Office  I discussed the limitations of evaluation and management by telemedicine. The patient expressed understanding and agreed to proceed.       Review of Systems     Cardiac Risk Factors include: advanced age (>66men, >65 women);dyslipidemia;hypertension;male gender     Objective:    Today's Vitals   02/25/23 1231  Weight: 195 lb (88.5 kg)  Height: 5\' 10"  (1.778 m)   Body mass index is 27.98 kg/m.     02/25/2023   12:40 PM 10/10/2022    1:28 PM 10/10/2022    5:15 AM 10/09/2022   11:03 AM 10/02/2022    9:25 AM 05/28/2022   10:29 AM 02/23/2022    1:46 PM  Advanced Directives  Does Patient Have a Medical Advance Directive? No No No No No No No  Would patient like information on creating a medical advance directive? No - Patient declined  No - Patient declined No - Patient declined No - Patient declined      Current Medications (verified) Outpatient Encounter Medications as of 02/25/2023  Medication Sig   acetaminophen (TYLENOL) 325 MG tablet Take 2 tablets (650 mg total) by mouth every 6 (six) hours as needed for mild pain, fever or headache (or Fever >/= 101).   diclofenac (VOLTAREN) 75 MG EC tablet Take 75 mg by mouth 2 (two) times daily. prn   diclofenac Sodium (VOLTAREN) 1 % GEL Apply 2 g topically 4 (four) times daily.   pravastatin (PRAVACHOL) 20 MG tablet TAKE 1 TABLET BY MOUTH EVERY DAY (Patient taking differently: Take 20 mg by mouth in the morning.)   valsartan (DIOVAN) 80 MG tablet Take 1 tablet (80 mg total) by mouth daily.   furosemide (LASIX) 20 MG tablet Take 1 tablet (20 mg total) by mouth daily as needed  for fluid. (Patient not taking: Reported on 02/25/2023)   Nutritional Supplements (FEEDING SUPPLEMENT, BOOST BREEZE,) LIQD Take 1 each by mouth 3 (three) times daily. (Patient not taking: Reported on 02/25/2023)   ondansetron (ZOFRAN) 4 MG tablet Take 1 tablet (4 mg total) by mouth every 6 (six) hours as needed for nausea. (Patient not taking: Reported on 02/25/2023)   No facility-administered encounter medications on file as of 02/25/2023.    Allergies (verified) Patient has no known allergies.   History: Past Medical History:  Diagnosis Date   Allergy    Arthritis    knee   Rectal cancer 08/2020   Past Surgical History:  Procedure Laterality Date   ESOPHAGOGASTRODUODENOSCOPY (EGD) WITH PROPOFOL N/A 03/14/2021   Procedure: ESOPHAGOGASTRODUODENOSCOPY (EGD) WITH PROPOFOL;  Surgeon: Shellia Cleverly, DO;  Location: [facility];  Service: Gastroenterology;  Laterality: N/A;   IR CATHETER TUBE CHANGE  07/29/2021   IR IMAGING GUIDED PORT INSERTION  07/25/2020   IR RADIOLOGIST EVAL & MGMT  04/22/2021   IR RADIOLOGIST EVAL & MGMT  05/07/2021   IR RADIOLOGIST EVAL & MGMT  07/24/2021   IR RADIOLOGIST EVAL & MGMT  09/17/2021   IR REMOVAL TUN ACCESS W/ PORT W/O FL MOD SED  12/04/2022   IR SINUS/FIST TUBE CHK-NON GI  04/10/2021   IR SINUS/FIST TUBE CHK-NON GI  04/10/2021  IR US GUIDE BX ASP/DRAIN  03/11/2021   OSTOMY N/A 03/05/2021   Procedure: OSTOMY;  Surgeon: Romie Levee, MD;  Location: [facility];  Service: General;  Laterality: N/A;   WISDOM TOOTH EXTRACTION     XI ROBOTIC ASSISTED LOWER ANTERIOR RESECTION N/A 03/05/2021   Procedure: XI ROBOTIC ASSISTED LOWER ANTERIOR RESECTION WITH MOBILIZATION OF THE SPLENIC FLEXURE OF THE COLON AND END COLOSTOMY;  Surgeon: Romie Levee, MD;  Location: [facility];  Service: General;  Laterality: N/A;  6 HOURS   Family History  Problem Relation Age of Onset   Healthy Mother    Hypertension Father    Stroke Father    Colon cancer Neg Hx    Esophageal cancer Neg Hx     Stomach cancer Neg Hx    Rectal cancer Neg Hx    Social History   Socioeconomic History   Marital status: Married    Spouse name: Not on file   Number of children: 2   Years of education: 14   Highest education level: Not on file  Occupational History   Occupation: Retail banker    Comment: retired  Tobacco Use   Smoking status: Former    Packs/day: 1.00    Years: 10.00    Additional pack years: 0.00    Total pack years: 10.00    Types: Cigarettes    Quit date: 08/09/1978    Years since quitting: 44.5   Smokeless tobacco: Never  Vaping Use   Vaping Use: Never used  Substance and Sexual Activity   Alcohol use: No    Alcohol/week: 0.0 standard drinks of alcohol   Drug use: No   Sexual activity: Not Currently  Other Topics Concern   Not on file  Social History Narrative   Fun: Sherri Rad out with his grandkids, 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. Lives with wife   Social Determinants of Health   Financial Resource Strain: Low Risk  (02/23/2022)   Overall Financial Resource Strain (CARDIA)    Difficulty of Paying Living Expenses: Not hard at all  Food Insecurity: No Food Insecurity (10/10/2022)   Hunger Vital Sign    Worried About Running Out of Food in the Last Year: Never true    Ran Out of Food in the Last Year: Never true  Transportation Needs: No Transportation Needs (02/23/2022)   PRAPARE - Administrator, Civil Service (Medical): No    Lack of Transportation (Non-Medical): No  Physical Activity: Sufficiently Active (02/23/2022)   Exercise Vital Sign    Days of Exercise per Week: 5 days    Minutes of Exercise per Session: 90 min  Stress: No Stress Concern Present (02/23/2022)   [employer] of Occupational Health - Occupational Stress Questionnaire    Feeling of Stress : Not at all  Social Connections: Socially Integrated (02/25/2023)   Social Connection and Isolation Panel [NHANES]    Frequency of Communication with Friends and Family: More than three times  a week    Frequency of Social Gatherings with Friends and Family: More than three times a week    Attends Religious Services: More than 4 times per year    Active Member of [employer] or Organizations: No    Attends Engineer, structural: More than 4 times per year    Marital Status: Married    Tobacco Counseling Counseling given: Not Answered   Clinical Intake:  Pre-visit preparation completed: Yes  Pain : No/denies pain     BMI - recorded: 27.98 Nutritional  Status: BMI 25 -29 Overweight Nutritional Risks: None Diabetes: No  How often do you need to have someone help you when you read instructions, pamphlets, or other written materials from your doctor or pharmacy?: 1 - Never  Diabetic? No  Interpreter Needed?: No  Information entered by :: Kandis Cocking, CMA   Activities of Daily Living    02/25/2023   12:41 PM 10/10/2022    1:28 PM  In your present state of health, do you have any difficulty performing the following activities:  Hearing? 0 0  Vision? 0 0  Difficulty concentrating or making decisions? 0 0  Walking or climbing stairs? 0 0  Comment try to avoid due to arthritis in knees   Dressing or bathing? 0 0  Doing errands, shopping? 0 0  Preparing Food and eating ? N   Using the Toilet? N   In the past six months, have you accidently leaked urine? N   Do you have problems with loss of bowel control? N   Managing your Medications? N   Managing your Finances? N   Housekeeping or managing your Housekeeping? N     Patient Care Team: Olive Bass, FNP as PCP - General (Internal Medicine) Ladene Artist, MD as Consulting Physician (Oncology) Heilingoetter, Johnette Abraham, PA-C as Physician Assistant (Physician Assistant) Romie Levee, MD as Consulting Physician (General Surgery) Serena Colonel, MD as Consulting Physician (Otolaryngology) Shellia Cleverly, DO as Consulting Physician (Gastroenterology)  Indicate any recent Medical Services  you may have received from other than [facility] providers in the past year (date may be approximate).     Assessment:   This is a routine wellness examination for [place].  Hearing/Vision screen Hearing Screening - Comments:: Denies hearing difficulties   Vision Screening - Comments:: Wears rx contact lens  - up to date with routine eye exams with [facility]  Dietary issues and exercise activities discussed: Current Exercise Habits: The patient does not participate in regular exercise at present, Exercise limited by: None identified   Goals Addressed             This Visit's Progress    Patient Stated   On track    02/23/2022, continue exercise and strength training        Depression Screen    02/25/2023   12:43 PM 02/23/2022    1:47 PM 01/20/2022    9:48 AM 05/20/2021   11:34 AM 08/30/2019   11:12 AM 08/22/2018    8:42 AM 10/22/2016    2:36 PM  PHQ 2/9 Scores  PHQ - 2 Score 0 0 0 1 0 0 0    Fall Risk    02/25/2023   12:33 PM 02/23/2022    1:47 PM 01/20/2022    9:48 AM 07/11/2021   11:12 AM 05/20/2021   11:34 AM  Fall Risk   Falls in the past year? 0 0 0 0 0  Number falls in past yr: 0 0 0 0 0  Injury with Fall? 0 0 0 0 0  Risk for fall due to : No Fall Risks   No Fall Risks   Follow up Falls prevention discussed Falls evaluation completed;Education provided;Falls prevention discussed  Falls evaluation completed Falls evaluation completed    FALL RISK PREVENTION PERTAINING TO THE HOME:  Any stairs in or around the home? Yes  If so, are there any without handrails? Yes  Home free of loose throw rugs in walkways, pet beds, electrical cords, etc? No  Adequate lighting  in your home to reduce risk of falls? Yes   ASSISTIVE DEVICES UTILIZED TO PREVENT FALLS:  Life alert? No  Use of a cane, walker or w/c? No  Grab bars in the bathroom? Yes  Shower chair or bench in shower? Yes  Elevated toilet seat or a handicapped toilet? Yes   TIMED UP AND GO:  Was the test performed? No  .   Televisit  Cognitive Function:        02/25/2023   12:43 PM 02/23/2022    1:48 PM  6CIT Screen  What Year? 0 points 0 points  What month? 0 points 0 points  What time? 0 points 0 points  Count back from 20 0 points 0 points  Months in reverse 0 points 0 points  Repeat phrase 0 points 0 points  Total Score 0 points 0 points    Immunizations Immunization History  Administered Date(s) Administered   Fluad Quad(high Dose 65+) 08/30/2019, 08/29/2020, 10/03/2022   Influenza, High Dose Seasonal PF 10/22/2016, 08/20/2017, 08/22/2018   Influenza,inj,Quad PF,6+ Mos 08/27/2015   Influenza-Unspecified 08/23/2021   PFIZER(Purple Top)SARS-COV-2 Vaccination 12/14/2019, 01/04/2020, 08/15/2020   Pfizer Covid-19 Vaccine Bivalent Booster 84yrs & up 09/05/2021   Pneumococcal Conjugate-13 09/02/2015   Pneumococcal Polysaccharide-23 10/23/2016   Tdap 08/27/2015   Zoster Recombinat (Shingrix) 08/22/2018, 08/30/2019    TDAP status: Up to date  Flu Vaccine status: Up to date  Pneumococcal vaccine status: Up to date  Covid-19 vaccine status: Information provided on how to obtain vaccines.   Qualifies for Shingles Vaccine? Yes   Zostavax completed No   Shingrix Completed?: Yes  Screening Tests Health Maintenance  Topic Date Due   COVID-19 Vaccine (5 - 2023-24 season) 07/10/2022   INFLUENZA VACCINE  06/10/2023   Medicare Annual Wellness (AWV)  02/25/2024   COLONOSCOPY (Pts 45-14yrs Insurance coverage will need to be confirmed)  06/17/2025   DTaP/Tdap/Td (2 - Td or Tdap) 08/26/2025   Pneumonia Vaccine 37+ Years old  Completed   Hepatitis C Screening  Completed   Zoster Vaccines- Shingrix  Completed   HPV VACCINES  Aged Out    Health Maintenance  Health Maintenance Due  Topic Date Due   COVID-19 Vaccine (5 - 2023-24 season) 07/10/2022    Colorectal cancer screening: Type of screening: Colonoscopy. Completed 06/17/22.Marland Kitchen Repeat every 3 years  Lung Cancer Screening: (Low Dose CT  Chest recommended if Age 41-80 years, 30 pack-year currently smoking OR have quit w/in 15years.) does not qualify.   Lung Cancer Screening Referral: N/A  Additional Screening:  Hepatitis C Screening: does qualify; Completed 10/22/2016  Vision Screening: Recommended annual ophthalmology exams for early detection of glaucoma and other disorders of the eye.  Is the patient up to date with their annual eye exam?  Yes  Who is the provider or what is the name of the office in which the patient attends annual eye exams? [facility]  If pt is not established with a provider, would they like to be referred to a provider to establish care? No .   Dental Screening: Recommended annual dental exams for proper oral hygiene  Community Resource Referral / Chronic Care Management: CRR required this visit?  No   CCM required this visit?  No      Plan:     I have personally reviewed and noted the following in the patient's chart:   Medical and social history Use of alcohol, tobacco or illicit drugs  Current medications and supplements including opioid prescriptions. Patient is not  currently taking opioid prescriptions. Functional ability and status Nutritional status Physical activity Advanced directives List of other physicians Hospitalizations, surgeries, and ER visits in previous 12 months Vitals Screenings to include cognitive, depression, and falls Referrals and appointments  In addition, I have reviewed and discussed with patient certain preventive protocols, quality metrics, and best practice recommendations. A written personalized care plan for preventive services as well as general preventive health recommendations were provided to patient.     Milus Mallick, CMA   02/25/2023   Nurse Notes: No Concerns

## 2023-03-22 ENCOUNTER — Other Ambulatory Visit: Payer: Self-pay | Admitting: Family

## 2023-03-22 ENCOUNTER — Other Ambulatory Visit: Payer: Self-pay

## 2023-03-22 MED ORDER — VALSARTAN 80 MG PO TABS: 80.0000 mg | ORAL_TABLET | Freq: Every day | ORAL | 0 refills | Status: DC

## 2023-04-15 ENCOUNTER — Inpatient Hospital Stay: Payer: No Typology Code available for payment source | Admitting: Oncology

## 2023-04-15 ENCOUNTER — Inpatient Hospital Stay: Payer: No Typology Code available for payment source | Attending: Oncology

## 2023-04-15 VITALS — BP 152/72 | HR 70 | Temp 98.2°F | Resp 18 | Ht 70.0 in | Wt 198.8 lb

## 2023-04-15 DIAGNOSIS — C20 Malignant neoplasm of rectum: Secondary | ICD-10-CM | POA: Insufficient documentation

## 2023-04-15 LAB — CEA (ACCESS): CEA (CHCC): 1.54 ng/mL (ref 0.00–5.00)

## 2023-04-15 NOTE — Progress Notes (Signed)
[facility] OFFICE PROGRESS NOTE   Diagnosis: Rectal cancer  INTERVAL HISTORY:   Shane [name] returns as scheduled.  He feels well.  Good appetite.  Colostomy is functioning well.  He continues to have discharge from the rectum.  Objective:  Vital signs in last 24 hours:  Blood pressure (!) 152/72, pulse 70, temperature 98.2 F (36.8 C), temperature source Oral, resp. rate 18, height 5\' 10"  (1.778 m), weight 198 lb 12.8 oz (90.2 kg), SpO2 100 %.     Lymphatics: No cervical, supraclavicular, axillary, or inguinal nodes Resp: Lungs clear bilaterally Cardio: Regular rate and rhythm GI: Nontender, no mass, no hepatosplenomegaly, left lower quadrant colostomy with brown stool Vascular: No leg edema   Lab Results:  Lab Results  Component Value Date   WBC 4.6 10/12/2022   HGB 10.6 (L) 10/12/2022   HCT 31.6 (L) 10/12/2022   MCV 90.0 10/12/2022   PLT 185 10/12/2022   NEUTROABS 7.5 10/10/2022    CMP  Lab Results  Component Value Date   NA 137 10/12/2022   K 3.9 10/12/2022   CL 111 10/12/2022   CO2 23 10/12/2022   GLUCOSE 87 10/12/2022   BUN 13 10/12/2022   CREATININE 0.95 10/12/2022   CALCIUM 8.7 (L) 10/12/2022   PROT 6.4 (L) 10/12/2022   ALBUMIN 3.3 (L) 10/12/2022   AST 13 (L) 10/12/2022   ALT 14 10/12/2022   ALKPHOS 58 10/12/2022   BILITOT 0.8 10/12/2022   GFRNONAA >60 10/12/2022   GFRAA >60 07/15/2020    Lab Results  Component Value Date   CEA1 12.99 (H) 01/10/2021   CEA 1.54 04/15/2023    Medications: I have reviewed the patient's current medications.   Assessment/Plan: Rectal cancer CT abdomen/pelvis 06/27/2020-irregular masslike thickening of the rectum, increased colonic stool, perirectal infiltration into the presacral space, tiny bibasilar pulmonary nodules CT chest 07/12/2020-multiple small pulmonary nodules, largest 3 mm in the left apex MRI pelvis 07/12/2020-tumor at 11.4 centimeters from the anal verge, 6.6 cm from the internal  sphincter, tumor extends through the right lateral rectal wall and directly involves an adjacent loop of distal sigmoid colon with invasion of anterior peritoneal reflection and involvement of the right seminal vesicle, no lymphadenopathy.  T4N0 Colonoscopy 07/17/2020-mass at 12 cm from the anal verge, completely obstructing mass at 23 cm from the anal verge, large internal hemorrhoids, biopsy confirmed adenocarcinoma Cycle 1 FOLFOX 07/30/2020 Cycle 2 FOLFOX 08/13/2020 Cycle 3 FOLFOX 08/27/2020 Cycle 4 FOLFOX 09/09/2020 Cycle 5 FOLFOX 09/24/2020, 5-FU bolus held, oxaliplatin dose reduced secondary to neutropenia Cycle 6 FOLFOX 10/07/2020, 5-FU bolus and full dose oxaliplatin, Neulasta Cycle 7 FOLFOX 10/21/2020, Neulasta CTs 10/31/2020-no change in rectal mass, no evidence of nodal metastases, unchanged nonspecific lung nodules Cycle 8 FOLFOX 11/04/2020 Radiation/Xeloda  11/27/2020-01/03/2021 Low anterior resection 03/05/2021-residual moderately differentiated adenocarcinoma, treatment response score 2, tumor extends to the sigmoid colon, 26 negative lymph nodes, ypT4bypN0, negative resection margins, no loss of mismatch repair protein expression, MSI stable CT chest 01/06/2022-no evidence of metastatic disease, unchanged bilateral nodules compared to 2021 Colonoscopy 06/17/2022-polyp removed from the ascending colon, inflammation in the rectum-benign colonic mucosa and multiple fragments of granulation tissue CTs 09/25/2022-no evidence of recurrent disease, unchanged presacral gas/fluid collection   2.  Iron deficiency anemia secondary to #1 3.  Hemorrhoids 4.  Rectal pain secondary to hemorrhoids versus the primary rectal tumor 5.  Constipation secondary to #1, improved with a stool softener and laxative regimen 6.  Small indeterminate pulmonary nodules 7.  Arthritis 8.  Neutropenia secondary to chemotherapy 9.  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  He will return for an office visit and surveillance CTs in November.  He will continue colonoscopy surveillance with Dr. Barron Alvine.  Thornton Papas, MD  04/15/2023  10:45 AM

## 2023-04-25 ENCOUNTER — Other Ambulatory Visit: Payer: Self-pay | Admitting: Family

## 2023-04-29 ENCOUNTER — Ambulatory Visit (INDEPENDENT_AMBULATORY_CARE_PROVIDER_SITE_OTHER): Payer: No Typology Code available for payment source | Admitting: Family

## 2023-04-29 ENCOUNTER — Encounter: Payer: Self-pay | Admitting: Family

## 2023-04-29 ENCOUNTER — Other Ambulatory Visit: Payer: Self-pay | Admitting: Family

## 2023-04-29 VITALS — BP 120/70 | HR 73 | Temp 97.8°F | Ht 70.0 in | Wt 192.0 lb

## 2023-04-29 DIAGNOSIS — Z125 Encounter for screening for malignant neoplasm of prostate: Secondary | ICD-10-CM | POA: Diagnosis not present

## 2023-04-29 DIAGNOSIS — E785 Hyperlipidemia, unspecified: Secondary | ICD-10-CM

## 2023-04-29 DIAGNOSIS — Z0000 Encounter for general adult medical examination without abnormal findings: Secondary | ICD-10-CM

## 2023-04-29 NOTE — Progress Notes (Signed)
Shane [name]. is a 72 y.o. male with the following history as recorded in EpicCare:  Patient Active Problem List   Diagnosis Date Noted   Small bowel obstruction (HCC) 10/10/2022   SBO (small bowel obstruction) (HCC) 10/02/2022   Intra-abdominal fluid collection 10/02/2022   Pressure injury of skin 03/25/2021   Colostomy in place [facility]) 03/19/2021   Hyperglycemia 03/14/2021   Hypernatremia 03/13/2021   Acute upper GI bleeding 03/13/2021   Enterococcal sepsis (HCC) 03/13/2021   Hypokalemia 03/12/2021   Malnutrition of moderate degree 03/11/2021   Anemia    Hypotension    Tachypnea 03/09/2021   Positive D dimer 03/09/2021   Acute respiratory distress    Ileus (HCC)    AKI (acute kidney injury) (HCC) 03/08/2021   Hemorrhoids, internal 03/08/2021   Hypercholesteremia 03/08/2021   Pulmonary nodules 03/08/2021   Hypoxia 03/08/2021   Phimosis of penis s/p dilitation 03/05/2021 03/08/2021   Delirium 03/07/2021   Port-A-Cath in place 12/10/2020   Rectal carcinoma (HCC) 07/18/2020   Diarrhea 07/09/2020   Iron deficiency anemia 07/04/2020   Goals of care, counseling/discussion 07/04/2020   S/P carpal tunnel release 01/30/2020   Right carpal tunnel syndrome 09/19/2018   Osteoarthritis of spine with radiculopathy, cervical region 09/05/2018   Abnormality of gait 02/23/2018   Degenerative cervical disc 03/24/2016   Degenerative arthritis of knee, bilateral 10/24/2015   Routine general medical examination at a health care facility 09/02/2015   Numbness and tingling in hands 08/27/2015    Current Outpatient Medications  Medication Sig Dispense Refill   acetaminophen (TYLENOL) 325 MG tablet Take 2 tablets (650 mg total) by mouth every 6 (six) hours as needed for mild pain, fever or headache (or Fever >/= 101).     diclofenac (VOLTAREN) 75 MG EC tablet Take 75 mg by mouth 2 (two) times daily. prn     diclofenac Sodium (VOLTAREN) 1 % GEL Apply 2 g topically 4 (four) times daily. 50  g 0   pravastatin (PRAVACHOL) 20 MG tablet TAKE 1 TABLET BY MOUTH EVERY DAY 90 tablet 0   SODIUM FLUORIDE 5000 SENSITIVE 1.1-5 % GEL Take by mouth See admin instructions. daily     valsartan (DIOVAN) 80 MG tablet Take 1 tablet (80 mg total) by mouth daily. 30 tablet 0   No current facility-administered medications for this visit.    Allergies: Patient has no known allergies.  Past Medical History:  Diagnosis Date   Allergy    Arthritis    knee   Rectal cancer (HCC) 08/2020    Past Surgical History:  Procedure Laterality Date   ESOPHAGOGASTRODUODENOSCOPY (EGD) WITH PROPOFOL N/A 03/14/2021   Procedure: ESOPHAGOGASTRODUODENOSCOPY (EGD) WITH PROPOFOL;  Surgeon: Shellia Cleverly, DO;  Location: [facility];  Service: Gastroenterology;  Laterality: N/A;   IR CATHETER TUBE CHANGE  07/29/2021   IR IMAGING GUIDED PORT INSERTION  07/25/2020   IR RADIOLOGIST EVAL & MGMT  04/22/2021   IR RADIOLOGIST EVAL & MGMT  05/07/2021   IR RADIOLOGIST EVAL & MGMT  07/24/2021   IR RADIOLOGIST EVAL & MGMT  09/17/2021   IR REMOVAL TUN ACCESS W/ PORT W/O FL MOD SED  12/04/2022   IR SINUS/FIST TUBE CHK-NON GI  04/10/2021   IR SINUS/FIST TUBE CHK-NON GI  04/10/2021   IR US GUIDE BX ASP/DRAIN  03/11/2021   OSTOMY N/A 03/05/2021   Procedure: OSTOMY;  Surgeon: Romie Levee, MD;  Location: [facility];  Service: General;  Laterality: N/A;   WISDOM TOOTH EXTRACTION  XI ROBOTIC ASSISTED LOWER ANTERIOR RESECTION N/A 03/05/2021   Procedure: XI ROBOTIC ASSISTED LOWER ANTERIOR RESECTION WITH MOBILIZATION OF THE SPLENIC FLEXURE OF THE COLON AND END COLOSTOMY;  Surgeon: Romie Levee, MD;  Location: [facility];  Service: General;  Laterality: N/A;  6 HOURS    Family History  Problem Relation Age of Onset   Healthy Mother    Hypertension Father    Stroke Father    Colon cancer Neg Hx    Esophageal cancer Neg Hx    Stomach cancer Neg Hx    Rectal cancer Neg Hx     Social History   Tobacco Use   Smoking status: Former     Packs/day: 1.00    Years: 10.00    Additional pack years: 0.00    Total pack years: 10.00    Types: Cigarettes    Quit date: 08/09/1978    Years since quitting: 44.7   Smokeless tobacco: Never  Substance Use Topics   Alcohol use: No    Alcohol/week: 0.0 standard drinks of alcohol    Subjective:   Presents for yearly CPE; in remission from rectal cancer; no acute concerns today;   Review of Systems  Constitutional: Negative.   HENT: Negative.    Eyes: Negative.   Respiratory: Negative.    Cardiovascular: Negative.   Gastrointestinal: Negative.   Genitourinary: Negative.   Musculoskeletal: Negative.   Skin: Negative.   Neurological: Negative.   Endo/Heme/Allergies: Negative.   Psychiatric/Behavioral: Negative.       Objective:  Vitals:   04/29/23 1530  BP: 120/70  Pulse: 73  Temp: 97.8 F (36.6 C)  TempSrc: Oral  SpO2: 98%  Weight: 192 lb (87.1 kg)  Height: 5\' 10"  (1.778 m)    General: Well developed, well nourished, in no acute distress  Skin : Warm and dry.  Head: Normocephalic and atraumatic  Eyes: Sclera and conjunctiva clear; pupils round and reactive to light; extraocular movements intact  Ears: External normal; canals clear; tympanic membranes normal  Oropharynx: Pink, supple. No suspicious lesions  Neck: Supple without thyromegaly, adenopathy  Lungs: Respirations unlabored; clear to auscultation bilaterally without wheeze, rales, rhonchi  CVS exam: normal rate and regular rhythm.  Musculoskeletal: No deformities; no active joint inflammation  Extremities: No edema, cyanosis, clubbing  Vessels: Symmetric bilaterally  Neurologic: Alert and oriented; speech intact; face symmetrical; moves all extremities well; CNII-XII intact without focal deficit   Assessment:  1. PE (physical exam), annual   2. Hyperlipidemia, unspecified hyperlipidemia type   3. Prostate cancer screening     Plan:  Age appropriate preventive healthcare needs addressed; encouraged  regular eye doctor and dental exams; encouraged regular exercise; will update labs and refills as needed today; follow-up to be determined;   No follow-ups on file.  Orders Placed This Encounter  Procedures   CBC with Differential/Platelet   Comp Met (CMET)   Lipid panel   PSA    Requested Prescriptions    No prescriptions requested or ordered in this encounter

## 2023-04-30 ENCOUNTER — Other Ambulatory Visit: Payer: Self-pay | Admitting: Family

## 2023-04-30 DIAGNOSIS — R899 Unspecified abnormal finding in specimens from other organs, systems and tissues: Secondary | ICD-10-CM

## 2023-04-30 LAB — LIPID PANEL
Cholesterol: 144 mg/dL (ref 0–200)
HDL: 30.5 mg/dL — ABNORMAL LOW (ref >39.00)
LDL Cholesterol: 94 mg/dL (ref 0–99)
NonHDL: 113.22
Total CHOL/HDL Ratio: 5
Triglycerides: 94 mg/dL (ref 0.0–149.0)
VLDL: 18.8 mg/dL (ref 0.0–40.0)

## 2023-04-30 LAB — COMPREHENSIVE METABOLIC PANEL
ALT: 21 U/L (ref 0–53)
AST: 20 U/L (ref 0–37)
Albumin: 4.4 g/dL (ref 3.5–5.2)
Alkaline Phosphatase: 101 U/L (ref 39–117)
BUN: 20 mg/dL (ref 6–23)
CO2: 26 mEq/L (ref 19–32)
Calcium: 9.5 mg/dL (ref 8.4–10.5)
Chloride: 102 mEq/L (ref 96–112)
Creatinine, Ser: 1.56 mg/dL — ABNORMAL HIGH (ref 0.40–1.50)
GFR: 43.79 mL/min — ABNORMAL LOW (ref >60.00)
Glucose, Bld: 89 mg/dL (ref 70–99)
Potassium: 4 mEq/L (ref 3.5–5.1)
Sodium: 138 mEq/L (ref 135–145)
Total Bilirubin: 1 mg/dL (ref 0.2–1.2)
Total Protein: 7.8 g/dL (ref 6.0–8.3)

## 2023-04-30 LAB — PSA: PSA: 0.3 ng/mL (ref 0.10–4.00)

## 2023-04-30 LAB — CBC WITH DIFFERENTIAL/PLATELET
Basophils Absolute: 0 10*3/uL (ref 0.0–0.1)
Basophils Relative: 0.9 % (ref 0.0–3.0)
Eosinophils Absolute: 0.1 10*3/uL (ref 0.0–0.7)
Eosinophils Relative: 1.4 % (ref 0.0–5.0)
HCT: 42.8 % (ref 39.0–52.0)
Hemoglobin: 14.1 g/dL (ref 13.0–17.0)
Lymphocytes Relative: 14.1 % (ref 12.0–46.0)
Lymphs Abs: 0.6 10*3/uL — ABNORMAL LOW (ref 0.7–4.0)
MCHC: 32.9 g/dL (ref 30.0–36.0)
MCV: 90.5 fl (ref 78.0–100.0)
Monocytes Absolute: 0.9 10*3/uL (ref 0.1–1.0)
Monocytes Relative: 19.6 % — ABNORMAL HIGH (ref 3.0–12.0)
Neutro Abs: 2.9 10*3/uL (ref 1.4–7.7)
Neutrophils Relative %: 64 % (ref 43.0–77.0)
Platelets: 228 10*3/uL (ref 150.0–400.0)
RBC: 4.73 Mil/uL (ref 4.22–5.81)
RDW: 13.2 % (ref 11.5–15.5)
WBC: 4.5 10*3/uL (ref 4.0–10.5)

## 2023-05-17 ENCOUNTER — Other Ambulatory Visit (INDEPENDENT_AMBULATORY_CARE_PROVIDER_SITE_OTHER): Payer: No Typology Code available for payment source

## 2023-05-17 DIAGNOSIS — R899 Unspecified abnormal finding in specimens from other organs, systems and tissues: Secondary | ICD-10-CM

## 2023-05-17 LAB — COMPREHENSIVE METABOLIC PANEL
ALT: 12 U/L (ref 0–53)
AST: 15 U/L (ref 0–37)
Albumin: 4.1 g/dL (ref 3.5–5.2)
Alkaline Phosphatase: 88 U/L (ref 39–117)
BUN: 16 mg/dL (ref 6–23)
CO2: 27 mEq/L (ref 19–32)
Calcium: 9.6 mg/dL (ref 8.4–10.5)
Chloride: 103 mEq/L (ref 96–112)
Creatinine, Ser: 1.14 mg/dL (ref 0.40–1.50)
GFR: 63.78 mL/min (ref >60.00)
Glucose, Bld: 101 mg/dL — ABNORMAL HIGH (ref 70–99)
Potassium: 4 mEq/L (ref 3.5–5.1)
Sodium: 138 mEq/L (ref 135–145)
Total Bilirubin: 0.8 mg/dL (ref 0.2–1.2)
Total Protein: 7.1 g/dL (ref 6.0–8.3)

## 2023-05-17 LAB — CBC WITH DIFFERENTIAL/PLATELET
Basophils Absolute: 0.1 10*3/uL (ref 0.0–0.1)
Basophils Relative: 1.7 % (ref 0.0–3.0)
Eosinophils Absolute: 0.3 10*3/uL (ref 0.0–0.7)
Eosinophils Relative: 5.7 % — ABNORMAL HIGH (ref 0.0–5.0)
HCT: 39.1 % (ref 39.0–52.0)
Hemoglobin: 13 g/dL (ref 13.0–17.0)
Lymphocytes Relative: 25.1 % (ref 12.0–46.0)
Lymphs Abs: 1.1 10*3/uL (ref 0.7–4.0)
MCHC: 33.3 g/dL (ref 30.0–36.0)
MCV: 89.9 fl (ref 78.0–100.0)
Monocytes Absolute: 0.5 10*3/uL (ref 0.1–1.0)
Monocytes Relative: 10.3 % (ref 3.0–12.0)
Neutro Abs: 2.5 10*3/uL (ref 1.4–7.7)
Neutrophils Relative %: 57.2 % (ref 43.0–77.0)
Platelets: 339 10*3/uL (ref 150.0–400.0)
RBC: 4.35 Mil/uL (ref 4.22–5.81)
RDW: 13.3 % (ref 11.5–15.5)
WBC: 4.4 10*3/uL (ref 4.0–10.5)

## 2023-07-09 DIAGNOSIS — I700 Atherosclerosis of aorta: Secondary | ICD-10-CM | POA: Diagnosis not present

## 2023-07-09 DIAGNOSIS — I129 Hypertensive chronic kidney disease with stage 1 through stage 4 chronic kidney disease, or unspecified chronic kidney disease: Secondary | ICD-10-CM | POA: Diagnosis not present

## 2023-07-09 DIAGNOSIS — E785 Hyperlipidemia, unspecified: Secondary | ICD-10-CM | POA: Diagnosis not present

## 2023-07-09 DIAGNOSIS — N182 Chronic kidney disease, stage 2 (mild): Secondary | ICD-10-CM | POA: Diagnosis not present

## 2023-07-09 DIAGNOSIS — G620 Drug-induced polyneuropathy: Secondary | ICD-10-CM | POA: Diagnosis not present

## 2023-07-09 DIAGNOSIS — E663 Overweight: Secondary | ICD-10-CM | POA: Diagnosis not present

## 2023-07-09 DIAGNOSIS — Z933 Colostomy status: Secondary | ICD-10-CM | POA: Diagnosis not present

## 2023-07-09 DIAGNOSIS — Z85038 Personal history of other malignant neoplasm of large intestine: Secondary | ICD-10-CM | POA: Diagnosis not present

## 2023-07-09 DIAGNOSIS — Z6827 Body mass index (BMI) 27.0-27.9, adult: Secondary | ICD-10-CM | POA: Diagnosis not present

## 2023-07-09 DIAGNOSIS — Z008 Encounter for other general examination: Secondary | ICD-10-CM | POA: Diagnosis not present

## 2023-08-06 DIAGNOSIS — Z933 Colostomy status: Secondary | ICD-10-CM | POA: Diagnosis not present

## 2023-08-06 DIAGNOSIS — K631 Perforation of intestine (nontraumatic): Secondary | ICD-10-CM | POA: Diagnosis not present

## 2023-08-23 ENCOUNTER — Encounter: Payer: Self-pay | Admitting: Oncology

## 2023-08-23 ENCOUNTER — Encounter: Payer: Self-pay | Admitting: Family

## 2023-08-24 ENCOUNTER — Other Ambulatory Visit: Payer: Self-pay | Admitting: Family

## 2023-09-10 ENCOUNTER — Ambulatory Visit
Admission: RE | Admit: 2023-09-10 | Discharge: 2023-09-10 | Disposition: A | Discharge status: Home / Self Care | Payer: No Typology Code available for payment source | Source: Ambulatory Visit | Attending: Oncology | Admitting: Oncology

## 2023-09-10 DIAGNOSIS — Z85048 Personal history of other malignant neoplasm of rectum, rectosigmoid junction, and anus: Secondary | ICD-10-CM | POA: Diagnosis not present

## 2023-09-10 DIAGNOSIS — N433 Hydrocele, unspecified: Secondary | ICD-10-CM | POA: Diagnosis not present

## 2023-09-10 DIAGNOSIS — R911 Solitary pulmonary nodule: Secondary | ICD-10-CM | POA: Diagnosis not present

## 2023-09-10 DIAGNOSIS — C20 Malignant neoplasm of rectum: Secondary | ICD-10-CM

## 2023-09-10 DIAGNOSIS — K6389 Other specified diseases of intestine: Secondary | ICD-10-CM | POA: Diagnosis not present

## 2023-09-10 DIAGNOSIS — N3289 Other specified disorders of bladder: Secondary | ICD-10-CM | POA: Diagnosis not present

## 2023-09-10 MED ORDER — IOPAMIDOL (ISOVUE-300) INJECTION 61%
200.0000 mL | Freq: Once | INTRAVENOUS | Status: AC | PRN
  Administered 2023-09-10: 100 mL via INTRAVENOUS

## 2023-09-11 DIAGNOSIS — Z0100 Encounter for examination of eyes and vision without abnormal findings: Secondary | ICD-10-CM | POA: Diagnosis not present

## 2023-09-11 DIAGNOSIS — H2513 Age-related nuclear cataract, bilateral: Secondary | ICD-10-CM | POA: Diagnosis not present

## 2023-09-14 ENCOUNTER — Telehealth: Payer: Self-pay | Admitting: *Deleted

## 2023-09-14 NOTE — Telephone Encounter (Signed)
LVM for patient to call for CT results.

## 2023-09-14 NOTE — Telephone Encounter (Signed)
Patient notified of CT results. Will f/u in 2 weeks.

## 2023-09-14 NOTE — Telephone Encounter (Signed)
-----   Message from Thornton Papas sent at 09/13/2023  1:23 PM EST ----- Please call patient, CT shows no clear evidence of recurrent cancer, increased pelvic soft tissue likely related to surgery and radiation with a new small nodular area, we will follow this with a repeat CT in 3-4 months, I will review the images with him when he is here in 2 weeks

## 2023-09-17 DIAGNOSIS — M170 Bilateral primary osteoarthritis of knee: Secondary | ICD-10-CM | POA: Diagnosis not present

## 2023-09-24 DIAGNOSIS — M170 Bilateral primary osteoarthritis of knee: Secondary | ICD-10-CM | POA: Diagnosis not present

## 2023-09-29 ENCOUNTER — Inpatient Hospital Stay: Payer: No Typology Code available for payment source | Attending: Oncology

## 2023-09-29 ENCOUNTER — Inpatient Hospital Stay: Payer: No Typology Code available for payment source | Admitting: Oncology

## 2023-09-29 ENCOUNTER — Inpatient Hospital Stay: Payer: No Typology Code available for payment source

## 2023-09-29 VITALS — BP 146/73 | HR 82 | Temp 98.2°F | Resp 18 | Ht 70.0 in | Wt 194.2 lb

## 2023-09-29 DIAGNOSIS — Z23 Encounter for immunization: Secondary | ICD-10-CM | POA: Insufficient documentation

## 2023-09-29 DIAGNOSIS — C20 Malignant neoplasm of rectum: Secondary | ICD-10-CM | POA: Diagnosis not present

## 2023-09-29 LAB — BASIC METABOLIC PANEL - CANCER CENTER ONLY
Anion gap: 5 (ref 5–15)
BUN: 20 mg/dL (ref 8–23)
CO2: 28 mmol/L (ref 22–32)
Calcium: 9.4 mg/dL (ref 8.9–10.3)
Chloride: 107 mmol/L (ref 98–111)
Creatinine: 1.1 mg/dL (ref 0.61–1.24)
GFR, Estimated: >60 mL/min (ref >60)
Glucose, Bld: 109 mg/dL — ABNORMAL HIGH (ref 70–99)
Potassium: 3.9 mmol/L (ref 3.5–5.1)
Sodium: 140 mmol/L (ref 135–145)

## 2023-09-29 LAB — CEA (ACCESS): CEA (CHCC): 2.06 ng/mL (ref 0.00–5.00)

## 2023-09-29 MED ORDER — INFLUENZA VAC A&B SURF ANT ADJ 0.5 ML IM SUSY
0.5000 mL | PREFILLED_SYRINGE | Freq: Once | INTRAMUSCULAR | Status: AC
  Administered 2023-09-29: 0.5 mL via INTRAMUSCULAR
  Filled 2023-09-29: qty 0.5

## 2023-09-29 NOTE — Progress Notes (Signed)
[facility] OFFICE PROGRESS NOTE   Diagnosis: Rectal cancer  INTERVAL HISTORY:   Mr. Gilcrest returns as scheduled.  He feels well.  Good appetite and energy level.  The colostomy is functioning well.  He continues to have a small amount of charge from the rectum.  Minimal remaining peripheral neuropathy symptoms.  Objective:  Vital signs in last 24 hours:  Blood pressure (!) 146/73, pulse 82, temperature 98.2 F (36.8 C), temperature source Temporal, resp. rate 18, height 5\' 10"  (1.778 m), weight 194 lb 3.2 oz (88.1 kg), SpO2 99%.     Lymphatics: No cervical, supraclavicular, axillary, or inguinal nodes Resp: Lungs clear bilaterally Cardio: Regular rate and rhythm GI: No hepatosplenomegaly, left lower quadrant colostomy with brown stool, no mass Vascular: No leg edema   Lab Results:  Lab Results  Component Value Date   WBC 4.4 05/17/2023   HGB 13.0 05/17/2023   HCT 39.1 05/17/2023   MCV 89.9 05/17/2023   PLT 339.0 05/17/2023   NEUTROABS 2.5 05/17/2023    CMP  Lab Results  Component Value Date   NA 140 09/29/2023   K 3.9 09/29/2023   CL 107 09/29/2023   CO2 28 09/29/2023   GLUCOSE 109 (H) 09/29/2023   BUN 20 09/29/2023   CREATININE 1.10 09/29/2023   CALCIUM 9.4 09/29/2023   PROT 7.1 05/17/2023   ALBUMIN 4.1 05/17/2023   AST 15 05/17/2023   ALT 12 05/17/2023   ALKPHOS 88 05/17/2023   BILITOT 0.8 05/17/2023   GFRNONAA >60 09/29/2023   GFRAA >60 07/15/2020    Lab Results  Component Value Date   CEA1 12.99 (H) 01/10/2021   CEA 2.06 09/29/2023    Lab Results  Component Value Date   INR 1.2 04/03/2021   LABPROT 15.0 04/03/2021    Imaging:  No results found.  Medications: I have reviewed the patient's current medications.   Assessment/Plan: Rectal cancer CT abdomen/pelvis 06/27/2020-irregular masslike thickening of the rectum, increased colonic stool, perirectal infiltration into the presacral space, tiny bibasilar pulmonary  nodules CT chest 07/12/2020-multiple small pulmonary nodules, largest 3 mm in the left apex MRI pelvis 07/12/2020-tumor at 11.4 centimeters from the anal verge, 6.6 cm from the internal sphincter, tumor extends through the right lateral rectal wall and directly involves an adjacent loop of distal sigmoid colon with invasion of anterior peritoneal reflection and involvement of the right seminal vesicle, no lymphadenopathy.  T4N0 Colonoscopy 07/17/2020-mass at 12 cm from the anal verge, completely obstructing mass at 23 cm from the anal verge, large internal hemorrhoids, biopsy confirmed adenocarcinoma Cycle 1 FOLFOX 07/30/2020 Cycle 2 FOLFOX 08/13/2020 Cycle 3 FOLFOX 08/27/2020 Cycle 4 FOLFOX 09/09/2020 Cycle 5 FOLFOX 09/24/2020, 5-FU bolus held, oxaliplatin dose reduced secondary to neutropenia Cycle 6 FOLFOX 10/07/2020, 5-FU bolus and full dose oxaliplatin, Neulasta Cycle 7 FOLFOX 10/21/2020, Neulasta CTs 10/31/2020-no change in rectal mass, no evidence of nodal metastases, unchanged nonspecific lung nodules Cycle 8 FOLFOX 11/04/2020 Radiation/Xeloda  11/27/2020-01/03/2021 Low anterior resection 03/05/2021-residual moderately differentiated adenocarcinoma, treatment response score 2, tumor extends to the sigmoid colon, 26 negative lymph nodes, ypT4bypN0, negative resection margins, no loss of mismatch repair protein expression, MSI stable CT chest 01/06/2022-no evidence of metastatic disease, unchanged bilateral nodules compared to 2021 Colonoscopy 06/17/2022-polyp removed from the ascending colon, inflammation in the rectum-benign colonic mucosa and multiple fragments of granulation tissue CTs 09/25/2022-no evidence of recurrent disease, unchanged presacral gas/fluid collection CTs 09/10/2023-new nodular soft tissue at the anterior superior margin of the presacral soft tissue-nonspecific  2.  Iron deficiency anemia secondary to #1 3.  Hemorrhoids 4.  Rectal pain secondary to hemorrhoids versus the primary  rectal tumor 5.  Constipation secondary to #1, improved with a stool softener and laxative regimen 6.  Small indeterminate pulmonary nodules 7.  Arthritis 8.  Neutropenia secondary to chemotherapy 9.  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 Denzer is in clinical remission from rectal cancer.  I reviewed the restaging CT findings and images with him.  There is no clear evidence of disease progression.  The "new "nodular soft tissue adjacent to the presacral collection is most likely related to surgery.  It is not clear to me the area in question is new.  I will ask Dr. Maisie Fus to review the CTs and decide on further evaluation.  Mr. Soucie will return for an office visit and CEA in 6 months.  Thornton Papas, MD  09/29/2023  9:50 AM

## 2023-10-01 DIAGNOSIS — M170 Bilateral primary osteoarthritis of knee: Secondary | ICD-10-CM | POA: Diagnosis not present

## 2023-10-14 ENCOUNTER — Other Ambulatory Visit (HOSPITAL_BASED_OUTPATIENT_CLINIC_OR_DEPARTMENT_OTHER): Payer: Self-pay

## 2023-11-01 DIAGNOSIS — K631 Perforation of intestine (nontraumatic): Secondary | ICD-10-CM | POA: Diagnosis not present

## 2023-11-01 DIAGNOSIS — Z933 Colostomy status: Secondary | ICD-10-CM | POA: Diagnosis not present

## 2023-11-09 ENCOUNTER — Other Ambulatory Visit: Payer: Self-pay | Admitting: Family

## 2023-11-09 DIAGNOSIS — Z0100 Encounter for examination of eyes and vision without abnormal findings: Secondary | ICD-10-CM | POA: Diagnosis not present

## 2023-11-13 ENCOUNTER — Other Ambulatory Visit: Payer: Self-pay | Admitting: Family

## 2023-11-15 MED ORDER — PRAVASTATIN SODIUM 20 MG PO TABS: 20.0000 mg | ORAL_TABLET | Freq: Every day | ORAL | 0 refills | Status: DC

## 2023-12-10 DIAGNOSIS — N182 Chronic kidney disease, stage 2 (mild): Secondary | ICD-10-CM | POA: Diagnosis not present

## 2023-12-10 DIAGNOSIS — Z85048 Personal history of other malignant neoplasm of rectum, rectosigmoid junction, and anus: Secondary | ICD-10-CM | POA: Diagnosis not present

## 2023-12-10 DIAGNOSIS — E785 Hyperlipidemia, unspecified: Secondary | ICD-10-CM | POA: Diagnosis not present

## 2023-12-10 DIAGNOSIS — Z933 Colostomy status: Secondary | ICD-10-CM | POA: Diagnosis not present

## 2023-12-10 DIAGNOSIS — Z008 Encounter for other general examination: Secondary | ICD-10-CM | POA: Diagnosis not present

## 2023-12-10 DIAGNOSIS — E663 Overweight: Secondary | ICD-10-CM | POA: Diagnosis not present

## 2023-12-10 DIAGNOSIS — Z6827 Body mass index (BMI) 27.0-27.9, adult: Secondary | ICD-10-CM | POA: Diagnosis not present

## 2023-12-10 DIAGNOSIS — M1990 Unspecified osteoarthritis, unspecified site: Secondary | ICD-10-CM | POA: Diagnosis not present

## 2023-12-10 DIAGNOSIS — I129 Hypertensive chronic kidney disease with stage 1 through stage 4 chronic kidney disease, or unspecified chronic kidney disease: Secondary | ICD-10-CM | POA: Diagnosis not present

## 2023-12-31 ENCOUNTER — Encounter: Payer: Self-pay | Admitting: Family

## 2023-12-31 ENCOUNTER — Other Ambulatory Visit: Payer: Self-pay | Admitting: Family

## 2023-12-31 MED ORDER — VALSARTAN 80 MG PO TABS: 80.0000 mg | ORAL_TABLET | Freq: Every day | ORAL | 1 refills | Status: DC

## 2024-02-08 ENCOUNTER — Other Ambulatory Visit: Payer: Self-pay | Admitting: Family

## 2024-02-08 MED ORDER — PRAVASTATIN SODIUM 20 MG PO TABS: 20.0000 mg | ORAL_TABLET | Freq: Every day | ORAL | 0 refills | Status: DC

## 2024-02-08 NOTE — Telephone Encounter (Signed)
 Copied from CRM [id number]. Topic: Clinical - Medication Refill >> Feb 08, 2024  9:35 AM Lennart Pall wrote: Most Recent Primary Care Visit:  Provider: [facility]  [facility]  Visit Type: LAB  Date: 05/17/2023  Medication: pravastatin (PRAVACHOL) 20 MG tablet  Has the patient contacted their pharmacy? Yes (Agent: If no, request that the patient contact the pharmacy for the refill. If patient does not wish to contact the pharmacy document the reason why and proceed with request.) (Agent: If yes, when and what did the pharmacy advise?)  Is this the correct pharmacy for this prescription? Yes If no, delete pharmacy and type the correct one.  This is the patient's preferred pharmacy:  [id number] - [place], [place] - [address] AT [facility] [address] [place] [address] Phone: [phone] Fax: [phone]    Has the prescription been filled recently? Yes  Is the patient out of the medication? Yes  Has the patient been seen for an appointment in the last year OR does the patient have an upcoming appointment? Yes  Can we respond through MyChart? Yes  Agent: Please be advised that Rx refills may take up to 3 business days. We ask that you follow-up with your pharmacy.

## 2024-02-08 NOTE — Telephone Encounter (Signed)
 Last Fill: 11/15/23 90 tabs/0 RF  Last OV: 04/29/23 Next OV: 04/11/24 AWV  Routing to provider for review/authorization.

## 2024-03-09 ENCOUNTER — Other Ambulatory Visit: Payer: Self-pay | Admitting: Family

## 2024-03-09 NOTE — Telephone Encounter (Signed)
 Copied from CRM [id number]. Topic: Clinical - Medication Refill >> Mar 09, 2024  8:28 AM Allison Arena wrote: Most Recent Primary Care Visit:  Provider: [facility]  [facility]  Visit Type: LAB  Date: 05/17/2023  Medication: pravastatin  (PRAVACHOL ) 20 MG tablet  Has the patient contacted their pharmacy? Yes (Agent: If no, request that the patient contact the pharmacy for the refill. If patient does not wish to contact the pharmacy document the reason why and proceed with request.) (Agent: If yes, when and what did the pharmacy advise?)  Is this the correct pharmacy for this prescription? Yes If no, delete pharmacy and type the correct one.  This is the patient's preferred pharmacy:  [id number] - [place], [place] - [address] AT [facility] [address] [place] [address] Phone: [phone] Fax: [phone]   Has the prescription been filled recently? Yes  Is the patient out of the medication? Yes  Has the patient been seen for an appointment in the last year OR does the patient have an upcoming appointment? Yes  Can we respond through MyChart? Yes  Agent: Please be advised that Rx refills may take up to 3 business days. We ask that you follow-up with your pharmacy.

## 2024-03-10 MED ORDER — PRAVASTATIN SODIUM 20 MG PO TABS: 20.0000 mg | ORAL_TABLET | Freq: Every day | ORAL | 0 refills | Status: DC

## 2024-03-28 ENCOUNTER — Telehealth: Payer: Self-pay

## 2024-03-28 NOTE — Telephone Encounter (Signed)
 Copied from CRM [id number]. Topic: General - Other >> Mar 28, 2024 10:53 AM Dorisann Garre T wrote: Reason for CRM: patient is wanting to know who will be taking over for dr copeland when she leaves laura would like a call back at [phone]

## 2024-03-28 NOTE — Telephone Encounter (Signed)
 Spoke with pt, pt states he is having trouble with his insurance company, pt stated he is waiting on insurance to give him a call back "to figure it all out".

## 2024-03-30 ENCOUNTER — Inpatient Hospital Stay: Payer: Self-pay

## 2024-03-30 ENCOUNTER — Encounter: Payer: Self-pay | Admitting: Nurse Practitioner

## 2024-03-30 ENCOUNTER — Ambulatory Visit: Payer: Self-pay | Admitting: Oncology

## 2024-03-30 ENCOUNTER — Telehealth: Payer: Self-pay

## 2024-03-30 ENCOUNTER — Other Ambulatory Visit: Payer: No Typology Code available for payment source

## 2024-03-30 ENCOUNTER — Inpatient Hospital Stay: Payer: No Typology Code available for payment source | Attending: Oncology | Admitting: Nurse Practitioner

## 2024-03-30 VITALS — BP 146/80 | HR 76 | Temp 98.1°F | Resp 18 | Ht 70.0 in | Wt 200.3 lb

## 2024-03-30 DIAGNOSIS — R918 Other nonspecific abnormal finding of lung field: Secondary | ICD-10-CM | POA: Insufficient documentation

## 2024-03-30 DIAGNOSIS — C20 Malignant neoplasm of rectum: Secondary | ICD-10-CM

## 2024-03-30 LAB — CEA (ACCESS): CEA (CHCC): 2.09 ng/mL (ref 0.00–5.00)

## 2024-03-30 NOTE — Progress Notes (Signed)
 [facility] OFFICE PROGRESS NOTE   Diagnosis: Rectal cancer  INTERVAL HISTORY:   Shane [name] returns as scheduled.  He feels well.  Ostomy is functioning normally.  No bleeding.  No abdominal pain.  No nausea or vomiting.  Good appetite and good energy level.  Objective:  Vital signs in last 24 hours:  Blood pressure (!) 146/80, pulse 76, temperature 98.1 F (36.7 C), temperature source Temporal, resp. rate 18, height 5\' 10"  (1.778 m), weight 200 lb 4.8 oz (90.9 kg), SpO2 100%.    Lymphatics: No palpable cervical, supraclavicular, axillary or inguinal lymph nodes. Resp: Lungs clear bilaterally. Cardio: Regular rate and rhythm. GI: No hepatosplenomegaly.  No mass.  Left lower quadrant colostomy. Vascular: No leg edema.   Lab Results:  Lab Results  Component Value Date   WBC 4.4 05/17/2023   HGB 13.0 05/17/2023   HCT 39.1 05/17/2023   MCV 89.9 05/17/2023   PLT 339.0 05/17/2023   NEUTROABS 2.5 05/17/2023    Imaging:  No results found.  Medications: I have reviewed the patient's current medications.  Assessment/Plan: Rectal cancer CT abdomen/pelvis 06/27/2020-irregular masslike thickening of the rectum, increased colonic stool, perirectal infiltration into the presacral space, tiny bibasilar pulmonary nodules CT chest 07/12/2020-multiple small pulmonary nodules, largest 3 mm in the left apex MRI pelvis 07/12/2020-tumor at 11.4 centimeters from the anal verge, 6.6 cm from the internal sphincter, tumor extends through the right lateral rectal wall and directly involves an adjacent loop of distal sigmoid colon with invasion of anterior peritoneal reflection and involvement of the right seminal vesicle, no lymphadenopathy.  T4N0 Colonoscopy 07/17/2020-mass at 12 cm from the anal verge, completely obstructing mass at 23 cm from the anal verge, large internal hemorrhoids, biopsy confirmed adenocarcinoma Cycle 1 FOLFOX 07/30/2020 Cycle 2 FOLFOX 08/13/2020 Cycle 3 FOLFOX  08/27/2020 Cycle 4 FOLFOX 09/09/2020 Cycle 5 FOLFOX 09/24/2020, 5-FU bolus held, oxaliplatin  dose reduced secondary to neutropenia Cycle 6 FOLFOX 10/07/2020, 5-FU bolus and full dose oxaliplatin , Neulasta  Cycle 7 FOLFOX 10/21/2020, Neulasta  CTs 10/31/2020-no change in rectal mass, no evidence of nodal metastases, unchanged nonspecific lung nodules Cycle 8 FOLFOX 11/04/2020 Radiation/Xeloda   11/27/2020-01/03/2021 Low anterior resection 03/05/2021-residual moderately differentiated adenocarcinoma, treatment response score 2, tumor extends to the sigmoid colon, 26 negative lymph nodes, ypT4bypN0, negative resection margins, no loss of mismatch repair protein expression, MSI stable CT chest 01/06/2022-no evidence of metastatic disease, unchanged bilateral nodules compared to 2021 Colonoscopy 06/17/2022-polyp removed from the ascending colon, inflammation in the rectum-benign colonic mucosa and multiple fragments of granulation tissue CTs 09/25/2022-no evidence of recurrent disease, unchanged presacral gas/fluid collection CTs 09/10/2023-new nodular soft tissue at the anterior superior margin of the presacral soft tissue-nonspecific   2.  Iron  deficiency anemia secondary to #1 3.  Hemorrhoids 4.  Rectal pain secondary to hemorrhoids versus the primary rectal tumor 5.  Constipation secondary to #1, improved with a stool softener and laxative regimen 6.  Small indeterminate pulmonary nodules 7.  Arthritis 8.  Neutropenia secondary to chemotherapy 9.  Oxaliplatin  neuropathy-mild loss of vibratory sense on exam 09/24/2020, 10/07/2020, 10/21/2020, 11/04/2020      Disposition: [name] remains in clinical remission from rectal cancer.  CEA is stable in normal range.  Plan for surveillance CTs in 6 months.  He will return for an office visit in approximately 6 months to review scans.  We are available to see him sooner if needed.    Diana Forster ANP/GNP-BC   03/30/2024  10:01 AM

## 2024-03-30 NOTE — Telephone Encounter (Signed)
 [facility] Clinical Social Work  Clinical Social Work was referred by medical provider for discussions on advance directives.  Clinical Social Worker contacted patient by phone to offer support and assess for needs. CSW Provided education on advance directives and how to obtain one through [facility]. CSW provided contact via my chart for patient to schedule to complete an Advance Directive.  Maudie Sorrow, LCSW  Clinical Social Worker [facility]

## 2024-03-31 ENCOUNTER — Telehealth: Payer: Self-pay | Admitting: Oncology

## 2024-03-31 NOTE — Telephone Encounter (Signed)
 Patient has been scheduled for follow-up visit per 03/30/24 LOS.  Pt aware of scheduled appt details.

## 2024-04-04 ENCOUNTER — Encounter: Payer: Self-pay | Admitting: Oncology

## 2024-04-04 ENCOUNTER — Encounter: Payer: Self-pay | Admitting: Family

## 2024-04-04 NOTE — Telephone Encounter (Signed)
-----   Message from Coni Deep sent at 03/30/2024  8:16 PM EDT ----- Please call patient, the CEA is normal, f/u as scheduled

## 2024-04-04 NOTE — Telephone Encounter (Signed)
 Understood, no further questions.

## 2024-04-07 DIAGNOSIS — Z933 Colostomy status: Secondary | ICD-10-CM | POA: Diagnosis not present

## 2024-04-07 DIAGNOSIS — K631 Perforation of intestine (nontraumatic): Secondary | ICD-10-CM | POA: Diagnosis not present

## 2024-04-10 ENCOUNTER — Telehealth: Payer: Self-pay | Admitting: Family

## 2024-04-10 MED ORDER — PRAVASTATIN SODIUM 20 MG PO TABS: 20.0000 mg | ORAL_TABLET | Freq: Every day | ORAL | 0 refills | Status: DC

## 2024-04-10 NOTE — Telephone Encounter (Signed)
 Copied from CRM [id number]. Topic: Clinical - Medication Refill >> Apr 10, 2024 11:48 AM [place] M wrote: Medication: pravastatin  (PRAVACHOL ) 20 MG tablet  Has the patient contacted their pharmacy? Yes (Agent: If no, request that the patient contact the pharmacy for the refill. If patient does not wish to contact the pharmacy document the reason why and proceed with request.) (Agent: If yes, when and what did the pharmacy advise?)  This is the patient's preferred pharmacy:  [id number] - [place], [place] - [address] AT [facility] [address] [place] [address] Phone: [phone] Fax: [phone]   Is this the correct pharmacy for this prescription? Yes If no, delete pharmacy and type the correct one.   Has the prescription been filled recently? Yes  Is the patient out of the medication? Yes  Has the patient been seen for an appointment in the last year OR does the patient have an upcoming appointment? Yes  Can we respond through MyChart? Yes  Agent: Please be advised that Rx refills may take up to 3 business days. We ask that you follow-up with your pharmacy.

## 2024-04-11 ENCOUNTER — Ambulatory Visit

## 2024-04-11 VITALS — Ht 70.0 in | Wt 200.0 lb

## 2024-04-11 DIAGNOSIS — Z0000 Encounter for general adult medical examination without abnormal findings: Secondary | ICD-10-CM | POA: Diagnosis not present

## 2024-04-11 NOTE — Progress Notes (Addendum)
 Subjective:   Shane [name]. is a 73 y.o. who presents for a Medicare Wellness preventive visit.  As a reminder, Annual Wellness Visits don't include a physical exam, and some assessments may be limited, especially if this visit is performed virtually. We may recommend an in-person follow-up visit with your provider if needed.  Visit Complete: Virtual I connected with  Shane [name]. on 04/11/24 by a audio enabled telemedicine application and verified that I am speaking with the correct person using two identifiers.  Patient Location: Home  Provider Location: Office/Clinic  I discussed the limitations of evaluation and management by telemedicine. The patient expressed understanding and agreed to proceed.  Vital Signs: Because this visit was a virtual/telehealth visit, some criteria may be missing or patient reported. Any vitals not documented were not able to be obtained and vitals that have been documented are patient reported.  VideoDeclined- This patient declined Librarian, academic. Therefore the visit was completed with audio only.  Persons Participating in Visit: Patient.  AWV Questionnaire: No: Patient Medicare AWV questionnaire was not completed prior to this visit.  Cardiac Risk Factors include: advanced age (>91men, >30 women);dyslipidemia     Objective:     Today's Vitals   04/11/24 1301 04/11/24 1307  Weight: 200 lb (90.7 kg)   Height: 5\' 10"  (1.778 m)   PainSc:  0-No pain   Body mass index is 28.7 kg/m.     04/11/2024    1:23 PM 03/30/2024    9:55 AM 09/29/2023    9:27 AM 04/15/2023   10:10 AM 02/25/2023   12:40 PM 10/10/2022    1:28 PM 10/10/2022    5:15 AM  Advanced Directives  Does Patient Have a Medical Advance Directive? No No No No No No No  Would patient like information on creating a medical advance directive? -- Yes (MAU/Ambulatory/Procedural Areas - Information given) No - Patient declined Yes  (MAU/Ambulatory/Procedural Areas - Information given) No - Patient declined  No - Patient declined    Current Medications (verified) Outpatient Encounter Medications as of 04/11/2024  Medication Sig   acetaminophen  (TYLENOL ) 325 MG tablet Take 2 tablets (650 mg total) by mouth every 6 (six) hours as needed for mild pain, fever or headache (or Fever >/= 101).   diclofenac  (VOLTAREN ) 75 MG EC tablet Take 75 mg by mouth 2 (two) times daily. prn   diclofenac  Sodium (VOLTAREN ) 1 % GEL Apply 2 g topically 4 (four) times daily. (Patient taking differently: Apply 2 g topically as needed.)   pravastatin  (PRAVACHOL ) 20 MG tablet Take 1 tablet (20 mg total) by mouth daily.   valsartan  (DIOVAN ) 80 MG tablet Take 1 tablet (80 mg total) by mouth daily.   No facility-administered encounter medications on file as of 04/11/2024.    Allergies (verified) Patient has no known allergies.   History: Past Medical History:  Diagnosis Date   Allergy    Arthritis    knee   Rectal cancer (HCC) 08/2020   Past Surgical History:  Procedure Laterality Date   ESOPHAGOGASTRODUODENOSCOPY (EGD) WITH PROPOFOL  N/A 03/14/2021   Procedure: ESOPHAGOGASTRODUODENOSCOPY (EGD) WITH PROPOFOL ;  Surgeon: Shane Kinder, DO;  [facility];  Service: Gastroenterology;  Laterality: N/A;   IR CATHETER TUBE CHANGE  07/29/2021   IR IMAGING GUIDED PORT INSERTION  07/25/2020   IR RADIOLOGIST EVAL & MGMT  04/22/2021   IR RADIOLOGIST EVAL & MGMT  05/07/2021   IR RADIOLOGIST EVAL & MGMT  07/24/2021  IR RADIOLOGIST EVAL & MGMT  09/17/2021   IR REMOVAL TUN ACCESS W/ PORT W/O FL MOD SED  12/04/2022   IR SINUS/FIST TUBE CHK-NON GI  04/10/2021   IR SINUS/FIST TUBE CHK-NON GI  04/10/2021   IR US  GUIDE BX ASP/DRAIN  03/11/2021   OSTOMY N/A 03/05/2021   Procedure: OSTOMY;  Surgeon: Shane Nixon, MD;  Location: [facility];  Service: General;  Laterality: N/A;   WISDOM TOOTH EXTRACTION     XI ROBOTIC ASSISTED LOWER ANTERIOR RESECTION N/A 03/05/2021    Procedure: XI ROBOTIC ASSISTED LOWER ANTERIOR RESECTION WITH MOBILIZATION OF THE SPLENIC FLEXURE OF THE COLON AND END COLOSTOMY;  Surgeon: Shane Nixon, MD;  Location: [facility];  Service: General;  Laterality: N/A;  6 HOURS   Family History  Problem Relation Age of Onset   Healthy Mother    Hypertension Father    Stroke Father    Colon cancer Neg Hx    Esophageal cancer Neg Hx    Stomach cancer Neg Hx    Rectal cancer Neg Hx    Social History   Socioeconomic History   Marital status: Married    Spouse name: Not on file   Number of children: 2   Years of education: 14   Highest education level: Not on file  Occupational History   Occupation: Retail banker    Comment: retired  Tobacco Use   Smoking status: Former    Current packs/day: 0.00    Average packs/day: 1 pack/day for 10.0 years (10.0 ttl pk-yrs)    Types: Cigarettes    Start date: 08/09/1968    Quit date: 08/09/1978    Years since quitting: 45.7   Smokeless tobacco: Never  Vaping Use   Vaping status: Never Used  Substance and Sexual Activity   Alcohol use: No    Alcohol/week: 0.0 standard drinks of alcohol   Drug use: No   Sexual activity: Not Currently  Other Topics Concern   Not on file  Social History Narrative   Fun: Shane [name] out with his grandkids, 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. Lives with wife   Was a Pensions consultant   Social Drivers of Health   Financial Resource Strain: Low Risk  (04/11/2024)   Overall Financial Resource Strain (CARDIA)    Difficulty of Paying Living Expenses: Not very hard  Food Insecurity: No Food Insecurity (04/11/2024)   Hunger Vital Sign    Worried About Running Out of Food in the Last Year: Never true    Ran Out of Food in the Last Year: Never true  Transportation Needs: No Transportation Needs (04/11/2024)   PRAPARE - Administrator, Civil Service (Medical): No    Lack of Transportation (Non-Medical): No  Physical Activity: Insufficiently  Active (04/11/2024)   Exercise Vital Sign    Days of Exercise per Week: 2 days    Minutes of Exercise per Session: 20 min  Stress: No Stress Concern Present (04/11/2024)   [employer] of Occupational Health - Occupational Stress Questionnaire    Feeling of Stress : Not at all  Social Connections: Socially Integrated (04/11/2024)   Social Connection and Isolation Panel [NHANES]    Frequency of Communication with Friends and Family: More than three times a week    Frequency of Social Gatherings with Friends and Family: More than three times a week    Attends Religious Services: More than 4 times per year    Active Member of [employer] or Organizations: Yes  Attends Engineer, structural: More than 4 times per year    Marital Status: Married    Tobacco Counseling Counseling given: Not Answered    Clinical Intake:  Pre-visit preparation completed: Yes  Pain : No/denies pain Pain Score: 0-No pain     BMI - recorded: 28.7 Nutritional Status: BMI 25 -29 Overweight Nutritional Risks: None Diabetes: No  Lab Results  Component Value Date   HGBA1C 4.4 (L) 08/27/2015     How often do you need to have someone help you when you read instructions, pamphlets, or other written materials from your doctor or pharmacy?: 1 - Never What is the last grade level you completed in school?: 12, some college classes  Interpreter Needed?: No  Information entered by :: Susa Engman, CMA   Activities of Daily Living     04/11/2024    1:10 PM  In your present state of health, do you have any difficulty performing the following activities:  Hearing? 0  Vision? 1  Comment wears contacts, last eye exam around 10/2024. [facility]  Difficulty concentrating or making decisions? 0  Walking or climbing stairs? 0  Comment Can walk up stairs but arthritis makes it painful  Dressing or bathing? 0  Doing errands, shopping? 0  Preparing Food and eating ? N  Using the Toilet? N   In the past six months, have you accidently leaked urine? N  Do you have problems with loss of bowel control? N  Managing your Medications? N  Managing your Finances? N  Housekeeping or managing your Housekeeping? N    Patient Care Team: Adra Alanis, FNP as PCP - General (Internal Medicine) Sumner Ends, MD as Consulting Physician (Oncology) Heilingoetter, Leita Purdue, PA-C as Physician Assistant (Physician Assistant) Shane Nixon, MD as Consulting Physician (General Surgery) Janita Mellow, MD as Consulting Physician (Otolaryngology) Shane Kinder, DO as Consulting Physician (Gastroenterology)  I have updated your Care Teams any recent Medical Services you may have received from other providers in the past year.     Assessment:    This is a routine wellness examination for [place].  Hearing/Vision screen Hearing Screening - Comments:: Denies difficulty Vision Screening - Comments:: Last eye exam 10/2023 at [facility] care   Goals Addressed               This Visit's Progress     Patient Stated (pt-stated)        He wants to get back on a regular exercise routine and start back at the gym       Depression Screen     04/11/2024    1:18 PM 04/29/2023    3:43 PM 02/25/2023   12:43 PM 02/23/2022    1:47 PM 01/20/2022    9:48 AM 05/20/2021   11:34 AM 08/30/2019   11:12 AM  PHQ 2/9 Scores  PHQ - 2 Score 0 0 0 0 0 1 0    Fall Risk     04/11/2024    1:24 PM 04/29/2023    3:43 PM 02/25/2023   12:33 PM 02/23/2022    1:47 PM 01/20/2022    9:48 AM  Fall Risk   Falls in the past year? 0 0 0 0 0  Number falls in past yr: 0 0 0 0 0  Injury with Fall? 0 0 0 0 0  Risk for fall due to : Impaired mobility No Fall Risks No Fall Risks    Risk for fall  due to: Comment has arthritis      Follow up Education provided Falls evaluation completed Falls prevention discussed Falls evaluation completed;Education provided;Falls prevention discussed     MEDICARE RISK AT  HOME:  Medicare Risk at Home Any stairs in or around the home?: No If so, are there any without handrails?: No Home free of loose throw rugs in walkways, pet beds, electrical cords, etc?: Yes Adequate lighting in your home to reduce risk of falls?: Yes Life alert?: No Use of a cane, walker or w/c?: No Grab bars in the bathroom?: Yes Shower chair or bench in shower?: Yes Elevated toilet seat or a handicapped toilet?: Yes  TIMED UP AND GO:  Was the test performed?  No  Cognitive Function: 6CIT completed        04/11/2024    1:26 PM 02/25/2023   12:43 PM 02/23/2022    1:48 PM  6CIT Screen  What Year? 0 points 0 points 0 points  What month? 0 points 0 points 0 points  What time? 0 points 0 points 0 points  Count back from 20 2 points 0 points 0 points  Months in reverse 0 points 0 points 0 points  Repeat phrase 2 points 0 points 0 points  Total Score 4 points 0 points 0 points    Immunizations Immunization History  Administered Date(s) Administered   Fluad Quad(high Dose 65+) 08/30/2019, 08/29/2020, 10/03/2022   Fluad Trivalent(High Dose 65+) 09/29/2023   Influenza, High Dose Seasonal PF 10/22/2016, 08/20/2017, 08/22/2018   Influenza,inj,Quad PF,6+ Mos 08/27/2015   Influenza-Unspecified 08/23/2021   PFIZER(Purple Top)SARS-COV-2 Vaccination 12/14/2019, 01/04/2020, 08/15/2020   Pfizer Covid-19 Vaccine Bivalent Booster 86yrs & up 09/05/2021   Pneumococcal Conjugate-13 09/02/2015   Pneumococcal Polysaccharide-23 10/23/2016   Tdap 08/27/2015   Zoster Recombinant(Shingrix ) 08/22/2018, 08/30/2019    Screening Tests Health Maintenance  Topic Date Due   Medicare Annual Wellness (AWV)  02/25/2024   COVID-19 Vaccine (5 - 2024-25 season) 04/11/2025 (Originally 07/11/2023)   INFLUENZA VACCINE  06/09/2024   Colonoscopy  06/17/2025   DTaP/Tdap/Td (2 - Td or Tdap) 08/26/2025   Pneumonia Vaccine 26+ Years old  Completed   Hepatitis C Screening  Completed   Zoster Vaccines- Shingrix    Completed   HPV VACCINES  Aged Out   Meningococcal B Vaccine  Aged Out    Health Maintenance  Health Maintenance Due  Topic Date Due   Medicare Annual Wellness (AWV)  02/25/2024   Health Maintenance Items Addressed: Pt will get future COVID vaccines at pharmacy if he decides to get more.  Additional Screening:  Vision Screening: Recommended annual ophthalmology exams for early detection of glaucoma and other disorders of the eye. Would you like a referral to an eye doctor? No    Dental Screening: Recommended annual dental exams for proper oral hygiene  Community Resource Referral / Chronic Care Management: CRR required this visit?  No   CCM required this visit?  No   Plan:    I have personally reviewed and noted the following in the patient's chart:   Medical and social history Use of alcohol, tobacco or illicit drugs  Current medications and supplements including opioid prescriptions. Patient is not currently taking opioid prescriptions. Functional ability and status Nutritional status Physical activity Advanced directives List of other physicians Hospitalizations, surgeries, and ER visits in previous 12 months Vitals Screenings to include cognitive, depression, and falls Referrals and appointments  In addition, I have reviewed and discussed with patient certain preventive protocols, quality metrics, and  best practice recommendations. A written personalized care plan for preventive services as well as general preventive health recommendations were provided to patient.   Susa Engman, CMA   04/11/2024   After Visit Summary: (MyChart) Due to this being a telephonic visit, the after visit summary with patients personalized plan was offered to patient via MyChart   Notes: {Nurse Notes:[id number]}

## 2024-04-12 NOTE — Patient Instructions (Signed)
 [name] , Thank you for taking time out of your busy schedule to complete your Annual Wellness Visit with me. I enjoyed our conversation and look forward to speaking with you again next year. I, as well as your care team,  appreciate your ongoing commitment to your health goals. Please review the following plan we discussed and let me know if I can assist you in the future. Your Game plan/ To Do List    Follow up Visits: Next Medicare AWV with our clinical staff: 04/12/25 11am   Next Office Visit with your provider: 05/02/24  Clinician Recommendations:  Aim for 30 minutes of exercise or brisk walking, 6-8 glasses of water , and 5 servings of fruits and vegetables each day.       This is a list of the screening recommended for you and due dates:  Health Maintenance  Topic Date Due   Medicare Annual Wellness Visit  02/25/2024   COVID-19 Vaccine (5 - 2024-25 season) 04/11/2025*   Flu Shot  06/09/2024   Colon Cancer Screening  06/17/2025   DTaP/Tdap/Td vaccine (2 - Td or Tdap) 08/26/2025   Pneumonia Vaccine  Completed   Hepatitis C Screening  Completed   Zoster (Shingles) Vaccine  Completed   HPV Vaccine  Aged Out   Meningitis B Vaccine  Aged Out  *Topic was postponed. The date shown is not the original due date.   Please send us  a copy of your advanced directives when you get those completed. You return those by either of the following:  Advanced directives: (Copy Requested) Please bring a copy of your health care power of attorney and living will to the office to be added to your chart at your convenience. You can mail to [facility] [address] [place], [place] [address] or email to [email or web address] Advance Care Planning is important because it:  [x]  Makes sure you receive the medical care that is consistent with your values, goals, and preferences  [x]  It provides guidance to your family and loved ones and reduces their decisional burden about whether  or not they are making the right decisions based on your wishes.  Follow the link provided in your after visit summary or read over the paperwork we have mailed to you to help you started getting your Advance Directives in place. If you need assistance in completing these, please reach out to us  so that we can help you!  See attachments for Preventive Care and Fall Prevention Tips.

## 2024-05-02 ENCOUNTER — Ambulatory Visit (INDEPENDENT_AMBULATORY_CARE_PROVIDER_SITE_OTHER): Admitting: Family

## 2024-05-02 ENCOUNTER — Encounter: Payer: Self-pay | Admitting: Family

## 2024-05-02 VITALS — BP 132/70 | HR 70 | Ht 70.0 in | Wt 196.8 lb

## 2024-05-02 DIAGNOSIS — M25562 Pain in left knee: Secondary | ICD-10-CM | POA: Diagnosis not present

## 2024-05-02 DIAGNOSIS — Z125 Encounter for screening for malignant neoplasm of prostate: Secondary | ICD-10-CM | POA: Diagnosis not present

## 2024-05-02 DIAGNOSIS — R262 Difficulty in walking, not elsewhere classified: Secondary | ICD-10-CM | POA: Diagnosis not present

## 2024-05-02 DIAGNOSIS — M170 Bilateral primary osteoarthritis of knee: Secondary | ICD-10-CM | POA: Diagnosis not present

## 2024-05-02 DIAGNOSIS — M25561 Pain in right knee: Secondary | ICD-10-CM | POA: Diagnosis not present

## 2024-05-02 DIAGNOSIS — Z0000 Encounter for general adult medical examination without abnormal findings: Secondary | ICD-10-CM

## 2024-05-02 DIAGNOSIS — Z13220 Encounter for screening for lipoid disorders: Secondary | ICD-10-CM

## 2024-05-02 MED ORDER — PRAVASTATIN SODIUM 20 MG PO TABS: 20.0000 mg | ORAL_TABLET | Freq: Every day | ORAL | 3 refills | Status: AC

## 2024-05-02 MED ORDER — VALSARTAN 80 MG PO TABS: 80.0000 mg | ORAL_TABLET | Freq: Every day | ORAL | 3 refills | Status: AC

## 2024-05-02 NOTE — Progress Notes (Signed)
 Shane [name]. is a 73 y.o. 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   Hyperglycemia 03/14/2021   Hypernatremia 03/13/2021   Acute upper GI bleeding 03/13/2021   Enterococcal sepsis (HCC) 03/13/2021   Hypokalemia 03/12/2021   Malnutrition of moderate degree 03/11/2021   Anemia    Hypotension    Tachypnea 03/09/2021   Positive D dimer 03/09/2021   Acute respiratory distress    Ileus (HCC)    AKI (acute kidney injury) (HCC) 03/08/2021   Hemorrhoids, internal 03/08/2021   Hypercholesteremia 03/08/2021   Pulmonary nodules 03/08/2021   Hypoxia 03/08/2021   Phimosis of penis s/p dilitation 03/05/2021 03/08/2021   Delirium 03/07/2021   Port-A-Cath in place 12/10/2020   Rectal carcinoma (HCC) 07/18/2020   Diarrhea 07/09/2020   Iron  deficiency anemia 07/04/2020   Goals of care, counseling/discussion 07/04/2020   S/P carpal tunnel release 01/30/2020   Right carpal tunnel syndrome 09/19/2018   Osteoarthritis of spine with radiculopathy, 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     diclofenac  (VOLTAREN ) 75 MG EC tablet Take 75 mg by mouth 2 (two) times daily. prn     diclofenac  Sodium (VOLTAREN ) 1 % GEL Apply 2 g topically 4 (four) times  daily. (Patient taking differently: Apply 2 g topically as needed.) 50 g 0   pravastatin  (PRAVACHOL ) 20 MG tablet Take 1 tablet (20 mg total) by mouth daily. 90 tablet 3   valsartan  (DIOVAN ) 80 MG tablet Take 1 tablet (80 mg total) by mouth daily. 90 tablet 3   No current facility-administered medications for this visit.    Allergies: Patient has no known allergies.  Past Medical History:  Diagnosis Date   Allergy    Arthritis    knee   Rectal cancer (HCC) 08/2020    Past Surgical History:  Procedure Laterality Date   ESOPHAGOGASTRODUODENOSCOPY (EGD) WITH PROPOFOL  N/A 03/14/2021   Procedure: ESOPHAGOGASTRODUODENOSCOPY (EGD) WITH PROPOFOL ;  Surgeon: San Sandor GAILS, DO;  [facility];  Service: Gastroenterology;  Laterality: N/A;   IR CATHETER TUBE CHANGE  07/29/2021   IR IMAGING GUIDED PORT INSERTION  07/25/2020   IR RADIOLOGIST EVAL & MGMT  04/22/2021   IR RADIOLOGIST EVAL & MGMT  05/07/2021   IR RADIOLOGIST EVAL & MGMT  07/24/2021   IR RADIOLOGIST EVAL & MGMT  09/17/2021   IR REMOVAL TUN ACCESS W/ PORT W/O FL MOD SED  12/04/2022   IR SINUS/FIST TUBE CHK-NON GI  04/10/2021   IR SINUS/FIST TUBE CHK-NON GI  04/10/2021   IR US  GUIDE BX ASP/DRAIN  03/11/2021   OSTOMY N/A 03/05/2021   Procedure: OSTOMY;  Surgeon: Debby Hila, MD;  Location: [facility];  Service: General;  Laterality: N/A;   WISDOM TOOTH EXTRACTION     XI ROBOTIC ASSISTED  LOWER ANTERIOR RESECTION N/A 03/05/2021   Procedure: XI ROBOTIC ASSISTED LOWER ANTERIOR RESECTION WITH MOBILIZATION OF THE SPLENIC FLEXURE OF THE COLON AND END COLOSTOMY;  Surgeon: Debby Hila, MD;  Location: [facility];  Service: General;  Laterality: N/A;  6 HOURS    Family History  Problem Relation Age of Onset   Healthy Mother    Hypertension Father    Stroke Father    Colon cancer Neg Hx    Esophageal cancer Neg Hx    Stomach cancer Neg Hx    Rectal cancer Neg Hx     Social History   Tobacco Use   Smoking status: Former    Current  packs/day: 0.00    Average packs/day: 1 pack/day for 10.0 years (10.0 ttl pk-yrs)    Types: Cigarettes    Start date: 08/09/1968    Quit date: 08/09/1978    Years since quitting: 45.7   Smokeless tobacco: Never  Substance Use Topics   Alcohol use: No    Alcohol/week: 0.0 standard drinks of alcohol    Subjective:   Patient presents for yearly CPE; no acute concerns today; needs to get yearly labs updated; is seeing oncologist every 6 months- good check up at last OV;    Review of Systems  Constitutional: Negative.   HENT: Negative.    Eyes: Negative.   Respiratory: Negative.    Cardiovascular: Negative.   Gastrointestinal: Negative.   Genitourinary: Negative.   Musculoskeletal: Negative.   Skin: Negative.   Neurological: Negative.   Endo/Heme/Allergies: Negative.   Psychiatric/Behavioral: Negative.       Objective:  Vitals:   05/02/24 1311  BP: 132/70  Pulse: 70  SpO2: 99%  Weight: 196 lb 12.8 oz (89.3 kg)  Height: 5' 10 (1.778 m)    General: Well developed, well nourished, in no acute distress  Skin : Warm and dry.  Head: Normocephalic and atraumatic  Eyes: Sclera and conjunctiva clear; pupils round and reactive to light; extraocular movements intact  Ears: External normal; canals clear; tympanic membranes normal  Oropharynx: Pink, supple. No suspicious lesions  Neck: Supple without thyromegaly, adenopathy  Lungs: Respirations unlabored; clear to auscultation bilaterally without wheeze, rales, rhonchi  CVS exam: normal rate and regular rhythm.  Musculoskeletal: No deformities; no active joint inflammation  Extremities: No edema, cyanosis, clubbing  Vessels: Symmetric bilaterally  Neurologic: Alert and oriented; speech intact; face symmetrical; moves all extremities well; CNII-XII intact without focal deficit   Assessment:  1. PE (physical exam), annual   2. Lipid screening   3. Prostate cancer screening     Plan:   Age appropriate preventive healthcare  needs addressed; encouraged regular eye doctor and dental exams; encouraged regular exercise; will update labs and refills as needed today; follow-up in 1 year, sooner prn.     No follow-ups on file.  Orders Placed This Encounter  Procedures   CBC with Differential/Platelet   Comp Met (CMET)   Lipid panel   PSA    Requested Prescriptions   Signed Prescriptions Disp Refills   valsartan  (DIOVAN ) 80 MG tablet 90 tablet 3    Sig: Take 1 tablet (80 mg total) by mouth daily.   pravastatin  (PRAVACHOL ) 20 MG tablet 90 tablet 3    Sig: Take 1 tablet (20 mg total) by mouth daily.

## 2024-05-03 ENCOUNTER — Ambulatory Visit: Payer: Self-pay | Admitting: Family

## 2024-05-03 LAB — CBC WITH DIFFERENTIAL/PLATELET
Basophils Absolute: 0.1 10*3/uL (ref 0.0–0.1)
Basophils Relative: 2.6 % (ref 0.0–3.0)
Eosinophils Absolute: 0.1 10*3/uL (ref 0.0–0.7)
Eosinophils Relative: 3 % (ref 0.0–5.0)
HCT: 37.4 % — ABNORMAL LOW (ref 39.0–52.0)
Hemoglobin: 12.7 g/dL — ABNORMAL LOW (ref 13.0–17.0)
Lymphocytes Relative: 16.8 % (ref 12.0–46.0)
Lymphs Abs: 0.7 10*3/uL (ref 0.7–4.0)
MCHC: 34 g/dL (ref 30.0–36.0)
MCV: 90.4 fl (ref 78.0–100.0)
Monocytes Absolute: 0.4 10*3/uL (ref 0.1–1.0)
Monocytes Relative: 10.1 % (ref 3.0–12.0)
Neutro Abs: 2.8 10*3/uL (ref 1.4–7.7)
Neutrophils Relative %: 67.5 % (ref 43.0–77.0)
Platelets: 262 10*3/uL (ref 150.0–400.0)
RBC: 4.13 Mil/uL — ABNORMAL LOW (ref 4.22–5.81)
RDW: 12.6 % (ref 11.5–15.5)
WBC: 4.1 10*3/uL (ref 4.0–10.5)

## 2024-05-03 LAB — PSA: PSA: 0.25 ng/mL (ref 0.10–4.00)

## 2024-05-03 LAB — COMPREHENSIVE METABOLIC PANEL WITH GFR
ALT: 12 U/L (ref 0–53)
AST: 15 U/L (ref 0–37)
Albumin: 4.3 g/dL (ref 3.5–5.2)
Alkaline Phosphatase: 90 U/L (ref 39–117)
BUN: 29 mg/dL — ABNORMAL HIGH (ref 6–23)
CO2: 26 meq/L (ref 19–32)
Calcium: 9.5 mg/dL (ref 8.4–10.5)
Chloride: 106 meq/L (ref 96–112)
Creatinine, Ser: 1.46 mg/dL (ref 0.40–1.50)
GFR: 47.07 mL/min — ABNORMAL LOW (ref >60.00)
Glucose, Bld: 84 mg/dL (ref 70–99)
Potassium: 4.8 meq/L (ref 3.5–5.1)
Sodium: 139 meq/L (ref 135–145)
Total Bilirubin: 0.8 mg/dL (ref 0.2–1.2)
Total Protein: 7 g/dL (ref 6.0–8.3)

## 2024-05-03 LAB — LIPID PANEL
Cholesterol: 120 mg/dL (ref 0–200)
HDL: 33.4 mg/dL — ABNORMAL LOW (ref >39.00)
LDL Cholesterol: 77 mg/dL (ref 0–99)
NonHDL: 86.93
Total CHOL/HDL Ratio: 4
Triglycerides: 52 mg/dL (ref 0.0–149.0)
VLDL: 10.4 mg/dL (ref 0.0–40.0)

## 2024-05-23 DIAGNOSIS — M25562 Pain in left knee: Secondary | ICD-10-CM | POA: Diagnosis not present

## 2024-05-23 DIAGNOSIS — R262 Difficulty in walking, not elsewhere classified: Secondary | ICD-10-CM | POA: Diagnosis not present

## 2024-05-23 DIAGNOSIS — M170 Bilateral primary osteoarthritis of knee: Secondary | ICD-10-CM | POA: Diagnosis not present

## 2024-05-23 DIAGNOSIS — M25561 Pain in right knee: Secondary | ICD-10-CM | POA: Diagnosis not present

## 2024-05-30 DIAGNOSIS — M25561 Pain in right knee: Secondary | ICD-10-CM | POA: Diagnosis not present

## 2024-05-30 DIAGNOSIS — R262 Difficulty in walking, not elsewhere classified: Secondary | ICD-10-CM | POA: Diagnosis not present

## 2024-05-30 DIAGNOSIS — M25562 Pain in left knee: Secondary | ICD-10-CM | POA: Diagnosis not present

## 2024-05-30 DIAGNOSIS — M170 Bilateral primary osteoarthritis of knee: Secondary | ICD-10-CM | POA: Diagnosis not present

## 2024-06-05 ENCOUNTER — Other Ambulatory Visit: Payer: Self-pay | Admitting: Family

## 2024-06-05 ENCOUNTER — Encounter: Payer: Self-pay | Admitting: Family

## 2024-06-05 MED ORDER — TRIAMCINOLONE ACETONIDE 0.1 % EX CREA: 1.0000 | TOPICAL_CREAM | Freq: Two times a day (BID) | CUTANEOUS | 0 refills | Status: DC

## 2024-06-06 DIAGNOSIS — R262 Difficulty in walking, not elsewhere classified: Secondary | ICD-10-CM | POA: Diagnosis not present

## 2024-06-06 DIAGNOSIS — M25561 Pain in right knee: Secondary | ICD-10-CM | POA: Diagnosis not present

## 2024-06-06 DIAGNOSIS — M170 Bilateral primary osteoarthritis of knee: Secondary | ICD-10-CM | POA: Diagnosis not present

## 2024-06-06 DIAGNOSIS — M25562 Pain in left knee: Secondary | ICD-10-CM | POA: Diagnosis not present

## 2024-07-28 ENCOUNTER — Telehealth: Payer: Self-pay

## 2024-07-28 ENCOUNTER — Other Ambulatory Visit: Payer: Self-pay | Admitting: Family

## 2024-07-28 DIAGNOSIS — L309 Dermatitis, unspecified: Secondary | ICD-10-CM

## 2024-07-28 NOTE — Telephone Encounter (Signed)
 Copied from CRM [id number]. Topic: Referral - Request for Referral >> Jul 28, 2024  8:58 AM Rea ORN wrote: Did the patient discuss referral with their provider in the last year? Yes (If No - schedule appointment) (If Yes - send message)  Appointment offered? No  Type of order/referral and detailed reason for visit: Dermatology  Preference of office, provider, location: No preference, just an office in [place]  If referral order, have you been seen by this specialty before? No (If Yes, this issue or another issue? When? Where?  Can we respond through MyChart? Yes

## 2024-09-04 ENCOUNTER — Telehealth: Payer: Self-pay | Admitting: Oncology

## 2024-09-04 NOTE — Telephone Encounter (Signed)
 Pt called to reschedule lab appt, has CT on 11/3. Moved lab appt time down.

## 2024-09-06 ENCOUNTER — Telehealth: Payer: Self-pay

## 2024-09-06 NOTE — Telephone Encounter (Signed)
 Copied from CRM [id number]. Topic: Appointments - Scheduling Inquiry for Clinic >> Sep 06, 2024  8:39 AM Shane [name] wrote: Reason for CRM: Patient states he was told by Dr, Geofm she would take him as a new patient and to call office and setup appointment with her, patients son in law Shane [name] is a current patient of Dr. Geofm Hazy [phone]

## 2024-09-07 ENCOUNTER — Encounter: Payer: Self-pay | Admitting: Internal Medicine

## 2024-09-07 NOTE — Progress Notes (Unsigned)
      Subjective:    [name]., male    DOB: Feb 24, 1951, 73 y.o.   MRN: [id number]     HPI [name] is here for follow up of his chronic medical problems.  He is here to establish with a new pcp.     Rectal cancer-clinical remission.  Following with oncology.  Has colostomy.  Had surgery, chemotherapy.  Has neuropathy secondary to chemotherapy.  Medications and allergies reviewed with patient and updated if appropriate.  Current Outpatient Medications on File Prior to Visit  Medication Sig Dispense Refill   acetaminophen  (TYLENOL ) 325 MG tablet Take 2 tablets (650 mg total) by mouth every 6 (six) hours as needed for mild pain, fever or headache (or Fever >/= 101).     diclofenac  (VOLTAREN ) 75 MG EC tablet Take 75 mg by mouth 2 (two) times daily. prn     diclofenac  Sodium (VOLTAREN ) 1 % GEL Apply 2 g topically 4 (four) times daily. (Patient taking differently: Apply 2 g topically as needed.) 50 g 0   pravastatin  (PRAVACHOL ) 20 MG tablet Take 1 tablet (20 mg total) by mouth daily. 90 tablet 3   triamcinolone  cream (KENALOG ) 0.1 % Apply 1 Application topically 2 (two) times daily. 80 g 0   valsartan  (DIOVAN ) 80 MG tablet Take 1 tablet (80 mg total) by mouth daily. 90 tablet 3   No current facility-administered medications on file prior to visit.     Review of Systems     Objective:  There were no vitals filed for this visit. BP Readings from Last 3 Encounters:  05/02/24 132/70  03/30/24 (!) 146/80  09/29/23 (!) 146/73   Wt Readings from Last 3 Encounters:  05/02/24 196 lb 12.8 oz (89.3 kg)  04/11/24 200 lb (90.7 kg)  03/30/24 200 lb 4.8 oz (90.9 kg)   There is no height or weight on file to calculate BMI.    Physical Exam     Lab Results  Component Value Date   WBC 4.1 05/02/2024   HGB 12.7 (L) 05/02/2024   HCT 37.4 (L) 05/02/2024   PLT 262.0 05/02/2024   GLUCOSE 84 05/02/2024   CHOL 120 05/02/2024   TRIG 52.0 05/02/2024   HDL 33.40 (L)  05/02/2024   LDLCALC 77 05/02/2024   ALT 12 05/02/2024   AST 15 05/02/2024   NA 139 05/02/2024   K 4.8 05/02/2024   CL 106 05/02/2024   CREATININE 1.46 05/02/2024   BUN 29 (H) 05/02/2024   CO2 26 05/02/2024   TSH 2.44 05/20/2021   PSA 0.25 05/02/2024   INR 1.2 04/03/2021   HGBA1C 4.4 (L) 08/27/2015     Assessment & Plan:    See Problem List for Assessment and Plan of chronic medical problems.

## 2024-09-08 ENCOUNTER — Ambulatory Visit: Admitting: Internal Medicine

## 2024-09-08 VITALS — BP 134/70 | HR 77 | Temp 98.3°F | Ht 70.0 in | Wt 196.0 lb

## 2024-09-08 DIAGNOSIS — D649 Anemia, unspecified: Secondary | ICD-10-CM

## 2024-09-08 DIAGNOSIS — Z85048 Personal history of other malignant neoplasm of rectum, rectosigmoid junction, and anus: Secondary | ICD-10-CM

## 2024-09-08 DIAGNOSIS — I10 Essential (primary) hypertension: Secondary | ICD-10-CM

## 2024-09-08 DIAGNOSIS — E7800 Pure hypercholesterolemia, unspecified: Secondary | ICD-10-CM | POA: Diagnosis not present

## 2024-09-08 DIAGNOSIS — Z933 Colostomy status: Secondary | ICD-10-CM

## 2024-09-08 DIAGNOSIS — R739 Hyperglycemia, unspecified: Secondary | ICD-10-CM | POA: Diagnosis not present

## 2024-09-08 DIAGNOSIS — R21 Rash and other nonspecific skin eruption: Secondary | ICD-10-CM | POA: Diagnosis not present

## 2024-09-08 DIAGNOSIS — M170 Bilateral primary osteoarthritis of knee: Secondary | ICD-10-CM

## 2024-09-08 DIAGNOSIS — Z23 Encounter for immunization: Secondary | ICD-10-CM

## 2024-09-08 DIAGNOSIS — G622 Polyneuropathy due to other toxic agents: Secondary | ICD-10-CM

## 2024-09-08 LAB — CBC WITH DIFFERENTIAL/PLATELET
Basophils Absolute: 0.1 K/uL (ref 0.0–0.1)
Basophils Relative: 2.5 % (ref 0.0–3.0)
Eosinophils Absolute: 0.1 K/uL (ref 0.0–0.7)
Eosinophils Relative: 2 % (ref 0.0–5.0)
HCT: 38.2 % — ABNORMAL LOW (ref 39.0–52.0)
Hemoglobin: 12.8 g/dL — ABNORMAL LOW (ref 13.0–17.0)
Lymphocytes Relative: 15 % (ref 12.0–46.0)
Lymphs Abs: 0.7 K/uL (ref 0.7–4.0)
MCHC: 33.5 g/dL (ref 30.0–36.0)
MCV: 90.5 fl (ref 78.0–100.0)
Monocytes Absolute: 0.5 K/uL (ref 0.1–1.0)
Monocytes Relative: 9.2 % (ref 3.0–12.0)
Neutro Abs: 3.5 K/uL (ref 1.4–7.7)
Neutrophils Relative %: 71.3 % (ref 43.0–77.0)
Platelets: 344 K/uL (ref 150.0–400.0)
RBC: 4.22 Mil/uL (ref 4.22–5.81)
RDW: 13.6 % (ref 11.5–15.5)
WBC: 5 K/uL (ref 4.0–10.5)

## 2024-09-08 LAB — LIPID PANEL
Cholesterol: 113 mg/dL (ref 0–200)
HDL: 34 mg/dL — ABNORMAL LOW (ref >39.00)
LDL Cholesterol: 69 mg/dL (ref 0–99)
NonHDL: 79.41
Total CHOL/HDL Ratio: 3
Triglycerides: 51 mg/dL (ref 0.0–149.0)
VLDL: 10.2 mg/dL (ref 0.0–40.0)

## 2024-09-08 LAB — COMPREHENSIVE METABOLIC PANEL WITH GFR
ALT: 83 U/L — ABNORMAL HIGH (ref 0–53)
AST: 36 U/L (ref 0–37)
Albumin: 4.3 g/dL (ref 3.5–5.2)
Alkaline Phosphatase: 141 U/L — ABNORMAL HIGH (ref 39–117)
BUN: 22 mg/dL (ref 6–23)
CO2: 28 meq/L (ref 19–32)
Calcium: 9.7 mg/dL (ref 8.4–10.5)
Chloride: 104 meq/L (ref 96–112)
Creatinine, Ser: 1.18 mg/dL (ref 0.40–1.50)
GFR: 60.63 mL/min (ref >60.00)
Glucose, Bld: 84 mg/dL (ref 70–99)
Potassium: 3.7 meq/L (ref 3.5–5.1)
Sodium: 138 meq/L (ref 135–145)
Total Bilirubin: 0.7 mg/dL (ref 0.2–1.2)
Total Protein: 7.6 g/dL (ref 6.0–8.3)

## 2024-09-08 LAB — HEMOGLOBIN A1C: Hgb A1c MFr Bld: 4.7 % (ref 4.6–6.5)

## 2024-09-08 MED ORDER — TRIAMCINOLONE ACETONIDE 0.1 % EX CREA: 1.0000 | TOPICAL_CREAM | Freq: Two times a day (BID) | CUTANEOUS | 0 refills | Status: AC

## 2024-09-08 NOTE — Assessment & Plan Note (Signed)
 Had minimal anemia in June with his last blood work CBC

## 2024-09-08 NOTE — Assessment & Plan Note (Signed)
 Chronic Mild-related to chemotherapy for rectal cancer

## 2024-09-08 NOTE — Assessment & Plan Note (Signed)
 Chronic Thought to be possible eczema Has responded to triamcinolone -rash is better, but still present after a month Discussed concerns with long-term use Referral to dermatology

## 2024-09-08 NOTE — Assessment & Plan Note (Signed)
Chronic ?Regular exercise and healthy diet encouraged ?Check lipid panel  ?Continue pravastatin 20 mg daily ?

## 2024-09-08 NOTE — Assessment & Plan Note (Signed)
 Chronic Bilateral knee osteoarthritis Getting injections as needed Taking diclofenac  75 mg twice daily or occasionally using diclofenac  gel Not currently exercising regularly and has noticed it has negatively affected his needs and will get back to regular exercise

## 2024-09-08 NOTE — Assessment & Plan Note (Signed)
 Chronic Related to surgery for rectal cancer Working well without issues

## 2024-09-08 NOTE — Addendum Note (Signed)
 Addended by: LEAR, Marycruz Boehner P on: 09/08/2024 03:53 PM   Modules accepted: Orders

## 2024-09-08 NOTE — Assessment & Plan Note (Signed)
Chronic Blood pressure well controlled CMP, CBC Continue valsartan 80 mg daily

## 2024-09-08 NOTE — Assessment & Plan Note (Signed)
 Chronic Check a1c Low sugar / carb diet Stressed regular exercise

## 2024-09-08 NOTE — Patient Instructions (Addendum)
    Flu immunization administered today.     Blood work was ordered.       Medications changes include :   None    A referral was ordered [facility] and someone will call you to schedule an appointment.     Return in about 6 months (around 03/08/2025) for follow up.

## 2024-09-10 ENCOUNTER — Ambulatory Visit: Payer: Self-pay | Admitting: Internal Medicine

## 2024-09-11 ENCOUNTER — Inpatient Hospital Stay: Attending: Oncology

## 2024-09-11 ENCOUNTER — Other Ambulatory Visit

## 2024-09-11 ENCOUNTER — Ambulatory Visit
Admission: RE | Admit: 2024-09-11 | Discharge: 2024-09-11 | Disposition: A | Discharge status: Home / Self Care | Source: Ambulatory Visit | Attending: Nurse Practitioner | Admitting: Nurse Practitioner

## 2024-09-11 DIAGNOSIS — C20 Malignant neoplasm of rectum: Secondary | ICD-10-CM | POA: Insufficient documentation

## 2024-09-11 LAB — BASIC METABOLIC PANEL - CANCER CENTER ONLY
Anion gap: 10 (ref 5–15)
BUN: 15 mg/dL (ref 8–23)
CO2: 24 mmol/L (ref 22–32)
Calcium: 9.9 mg/dL (ref 8.9–10.3)
Chloride: 104 mmol/L (ref 98–111)
Creatinine: 1.12 mg/dL (ref 0.61–1.24)
GFR, Estimated: >60 mL/min (ref >60)
Glucose, Bld: 104 mg/dL — ABNORMAL HIGH (ref 70–99)
Potassium: 4 mmol/L (ref 3.5–5.1)
Sodium: 138 mmol/L (ref 135–145)

## 2024-09-11 LAB — CEA (ACCESS): CEA (CHCC): 2.18 ng/mL (ref 0.00–5.00)

## 2024-09-11 MED ORDER — IOPAMIDOL (ISOVUE-300) INJECTION 61%
100.0000 mL | Freq: Once | INTRAVENOUS | Status: AC | PRN
  Administered 2024-09-11: 100 mL via INTRAVENOUS

## 2024-09-18 ENCOUNTER — Inpatient Hospital Stay: Admitting: Oncology

## 2024-09-18 VITALS — BP 139/73 | HR 82 | Temp 98.2°F | Resp 18 | Ht 70.0 in | Wt 191.6 lb

## 2024-09-18 DIAGNOSIS — C20 Malignant neoplasm of rectum: Secondary | ICD-10-CM

## 2024-09-18 NOTE — Progress Notes (Signed)
 [facility] OFFICE PROGRESS NOTE   Diagnosis: Rectal cancer  INTERVAL HISTORY:   Shane [name] returns as scheduled.  He feels well.  Good appetite.  He takes Voltaren  for knee pain.  He continues to have drainage from the rectum.  No new complaint.  He has persistent mild numbness in the fingers and feet  Objective:  Vital signs in last 24 hours:  Blood pressure 139/73, pulse 82, temperature 98.2 F (36.8 C), temperature source Temporal, resp. rate 18, height 5' 10 (1.778 m), weight 191 lb 9.6 oz (86.9 kg), SpO2 100%.   Lymphatics: No cervical, supraclavicular, axillary, or inguinal nodes Resp: Lungs clear bilaterally  Cardiac: Regular rate and rhythm GI: Left lower quadrant colostomy, no hepatosplenomegaly, no mass Vascular: No leg edema   Lab Results:  Lab Results  Component Value Date   WBC 5.0 09/08/2024   HGB 12.8 (L) 09/08/2024   HCT 38.2 (L) 09/08/2024   MCV 90.5 09/08/2024   PLT 344.0 09/08/2024   NEUTROABS 3.5 09/08/2024    CMP  Lab Results  Component Value Date   NA 138 09/11/2024   K 4.0 09/11/2024   CL 104 09/11/2024   CO2 24 09/11/2024   GLUCOSE 104 (H) 09/11/2024   BUN 15 09/11/2024   CREATININE 1.12 09/11/2024   CALCIUM  9.9 09/11/2024   PROT 7.6 09/08/2024   ALBUMIN  4.3 09/08/2024   AST 36 09/08/2024   ALT 83 (H) 09/08/2024   ALKPHOS 141 (H) 09/08/2024   BILITOT 0.7 09/08/2024   GFRNONAA >60 09/11/2024   GFRAA >60 07/15/2020    Lab Results  Component Value Date   CEA1 12.99 (H) 01/10/2021   CEA 2.18 09/11/2024      Medications: I have reviewed the patient's current medications.   Assessment/Plan:  Rectal cancer CT abdomen/pelvis 06/27/2020-irregular masslike thickening of the rectum, increased colonic stool, perirectal infiltration into the presacral space, tiny bibasilar pulmonary nodules CT chest 07/12/2020-multiple small pulmonary nodules, largest 3 mm in the left apex MRI pelvis 07/12/2020-tumor at 11.4 centimeters  from the anal verge, 6.6 cm from the internal sphincter, tumor extends through the right lateral rectal wall and directly involves an adjacent loop of distal sigmoid colon with invasion of anterior peritoneal reflection and involvement of the right seminal vesicle, no lymphadenopathy.  T4N0 Colonoscopy 07/17/2020-mass at 12 cm from the anal verge, completely obstructing mass at 23 cm from the anal verge, large internal hemorrhoids, biopsy confirmed adenocarcinoma Cycle 1 FOLFOX 07/30/2020 Cycle 2 FOLFOX 08/13/2020 Cycle 3 FOLFOX 08/27/2020 Cycle 4 FOLFOX 09/09/2020 Cycle 5 FOLFOX 09/24/2020, 5-FU bolus held, oxaliplatin  dose reduced secondary to neutropenia Cycle 6 FOLFOX 10/07/2020, 5-FU bolus and full dose oxaliplatin , Neulasta  Cycle 7 FOLFOX 10/21/2020, Neulasta  CTs 10/31/2020-no change in rectal mass, no evidence of nodal metastases, unchanged nonspecific lung nodules Cycle 8 FOLFOX 11/04/2020 Radiation/Xeloda   11/27/2020-01/03/2021 Low anterior resection 03/05/2021-residual moderately differentiated adenocarcinoma, treatment response score 2, tumor extends to the sigmoid colon, 26 negative lymph nodes, [id number], negative resection margins, no loss of mismatch repair protein expression, MSI stable CT chest 01/06/2022-no evidence of metastatic disease, unchanged bilateral nodules compared to 2021 Colonoscopy 06/17/2022-polyp removed from the ascending colon, inflammation in the rectum-benign colonic mucosa and multiple fragments of granulation tissue CTs 09/25/2022-no evidence of recurrent disease, unchanged presacral gas/fluid collection CTs 09/10/2023-new nodular soft tissue at the anterior superior margin of the presacral soft tissue-nonspecific CTs 09/11/2024: Unchanged postoperative/posttreatment with similar soft tissue and loculated fluid in the presacral space, no evidence of metastatic disease, large bilateral  hydroceles   2.  Iron  deficiency anemia secondary to #1 3.  Hemorrhoids 4.  Rectal  pain secondary to hemorrhoids versus the primary rectal tumor 5.  Constipation secondary to #1, improved with a stool softener and laxative regimen 6.  Small indeterminate pulmonary nodules 7.  Arthritis 8.  Neutropenia secondary to chemotherapy 9.  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  He will return for an office visit and CEA in 6 months.  He will continue colonoscopy surveillance with Dr. San.  He will be due for a colonoscopy in 2026.  Arley Hof, MD  09/18/2024  10:26 AM

## 2025-03-08 ENCOUNTER — Ambulatory Visit: Admitting: Internal Medicine

## 2025-03-12 ENCOUNTER — Inpatient Hospital Stay

## 2025-03-19 ENCOUNTER — Inpatient Hospital Stay: Admitting: Oncology

## 2025-04-12 ENCOUNTER — Ambulatory Visit

## 2025-04-23 ENCOUNTER — Ambulatory Visit: Admitting: Dermatology
